# Patient Record
Sex: Female | Born: 1960 | Race: White | Hispanic: No | Marital: Married | Smoking: Former smoker
Health system: Southern US, Community
[De-identification: ages and names within clinical notes are randomized; demographics above are authoritative.]

## PROBLEM LIST (undated history)

## (undated) DIAGNOSIS — K449 Diaphragmatic hernia without obstruction or gangrene: Secondary | ICD-10-CM

## (undated) DIAGNOSIS — R55 Syncope and collapse: Secondary | ICD-10-CM

## (undated) DIAGNOSIS — I2510 Atherosclerotic heart disease of native coronary artery without angina pectoris: Secondary | ICD-10-CM

## (undated) DIAGNOSIS — F329 Major depressive disorder, single episode, unspecified: Secondary | ICD-10-CM

## (undated) DIAGNOSIS — J701 Chronic and other pulmonary manifestations due to radiation: Secondary | ICD-10-CM

## (undated) DIAGNOSIS — C50412 Malignant neoplasm of upper-outer quadrant of left female breast: Secondary | ICD-10-CM

## (undated) DIAGNOSIS — Z98811 Dental restoration status: Secondary | ICD-10-CM

## (undated) DIAGNOSIS — R112 Nausea with vomiting, unspecified: Secondary | ICD-10-CM

## (undated) DIAGNOSIS — E538 Deficiency of other specified B group vitamins: Secondary | ICD-10-CM

## (undated) DIAGNOSIS — Z9889 Other specified postprocedural states: Secondary | ICD-10-CM

## (undated) DIAGNOSIS — N6320 Unspecified lump in the left breast, unspecified quadrant: Secondary | ICD-10-CM

## (undated) DIAGNOSIS — R7303 Prediabetes: Secondary | ICD-10-CM

## (undated) DIAGNOSIS — Z8719 Personal history of other diseases of the digestive system: Secondary | ICD-10-CM

## (undated) DIAGNOSIS — E039 Hypothyroidism, unspecified: Secondary | ICD-10-CM

## (undated) DIAGNOSIS — F419 Anxiety disorder, unspecified: Secondary | ICD-10-CM

## (undated) DIAGNOSIS — I341 Nonrheumatic mitral (valve) prolapse: Secondary | ICD-10-CM

## (undated) DIAGNOSIS — Z170 Estrogen receptor positive status [ER+]: Secondary | ICD-10-CM

## (undated) DIAGNOSIS — F988 Other specified behavioral and emotional disorders with onset usually occurring in childhood and adolescence: Secondary | ICD-10-CM

## (undated) DIAGNOSIS — E785 Hyperlipidemia, unspecified: Secondary | ICD-10-CM

## (undated) DIAGNOSIS — Z923 Personal history of irradiation: Secondary | ICD-10-CM

## (undated) DIAGNOSIS — I10 Essential (primary) hypertension: Secondary | ICD-10-CM

## (undated) DIAGNOSIS — J449 Chronic obstructive pulmonary disease, unspecified: Secondary | ICD-10-CM

## (undated) DIAGNOSIS — C50919 Malignant neoplasm of unspecified site of unspecified female breast: Secondary | ICD-10-CM

## (undated) DIAGNOSIS — C801 Malignant (primary) neoplasm, unspecified: Secondary | ICD-10-CM

## (undated) DIAGNOSIS — F32A Depression, unspecified: Secondary | ICD-10-CM

## (undated) HISTORY — PX: CARDIAC CATHETERIZATION: SHX172

## (undated) HISTORY — DX: Prediabetes: R73.03

## (undated) HISTORY — DX: Chronic obstructive pulmonary disease, unspecified: J44.9

## (undated) HISTORY — DX: Other specified behavioral and emotional disorders with onset usually occurring in childhood and adolescence: F98.8

## (undated) HISTORY — DX: Deficiency of other specified B group vitamins: E53.8

## (undated) HISTORY — PX: APPENDECTOMY: SHX54

## (undated) HISTORY — DX: Nonrheumatic mitral (valve) prolapse: I34.1

## (undated) HISTORY — DX: Hyperlipidemia, unspecified: E78.5

## (undated) HISTORY — DX: Diaphragmatic hernia without obstruction or gangrene: K44.9

## (undated) HISTORY — DX: Malignant (primary) neoplasm, unspecified: C80.1

## (undated) HISTORY — DX: Estrogen receptor positive status (ER+): Z17.0

## (undated) HISTORY — DX: Essential (primary) hypertension: I10

## (undated) HISTORY — DX: Estrogen receptor positive status (ER+): C50.412

## (undated) HISTORY — DX: Syncope and collapse: R55

## (undated) HISTORY — PX: ABDOMINAL HYSTERECTOMY: SHX81

---
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
## 2003-01-10 ENCOUNTER — Encounter: Admission: RE | Admit: 2003-01-10 | Discharge: 2003-01-10 | Payer: Self-pay | Admitting: Family Medicine

## 2003-01-10 IMAGING — US US SOFT TISSUE HEAD/NECK
1 series · 14 of 25 positions shown · non-contrast
Comparison: none

CLINICAL DATA: Enlarged thyroid on physical examination.
THYROID ULTRASOUND
The thyroid gland is mildly enlarged.  The right lobe measures 5.8 x 2.2 x 1.3 cm in maximum dimensions.  The left lobe measures 5.4 x 2.6 x 1.3 cm in maximum dimensions.  A mildly inhomogeneous solid mass with small cystic components is demonstrated in the posterior aspect of the inferior portion of the right lobe.  This mass measures 2.0 x 1.2 x 0.9 cm in maximum dimensions.  Also demonstrated is a 0.4 x 0.4 x 0.2 cm solid mass in the anterior aspect of the inferior portion of the right lobe.  A 1.2 x 1.2 x 0.7 cm solid mass is demonstrated in the inferior aspect of the left lobe.  The isthmus has a normal appearance, measuring 3.2 mm in thickness in the midline.
IMPRESSION
Mild multinodular goiter, as described above.

[Series 1: unknown · 0.07mm/px · 14 of 32 slices shown]
[im 1/32]
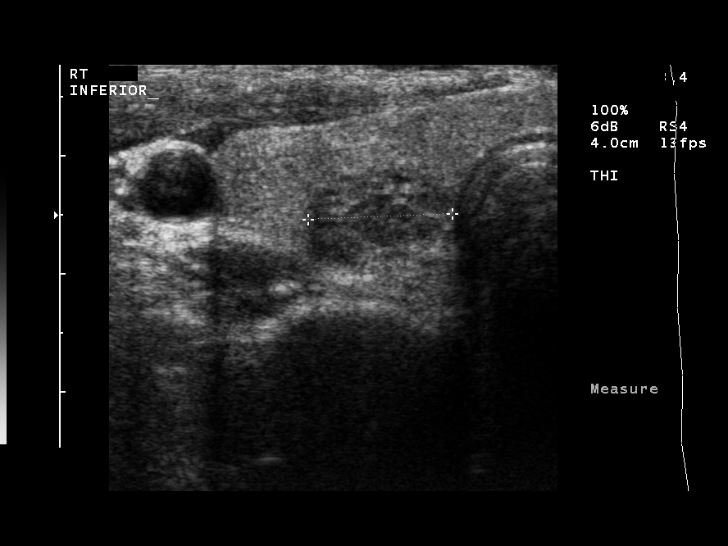
[im 3/32]
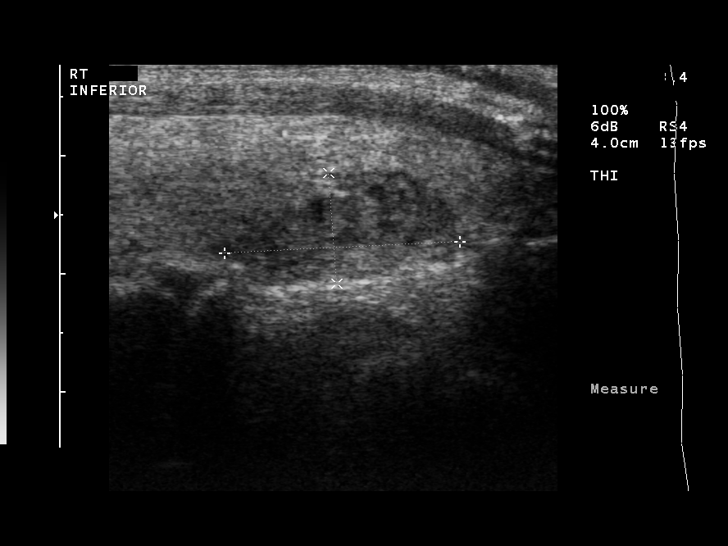
[im 6/32]
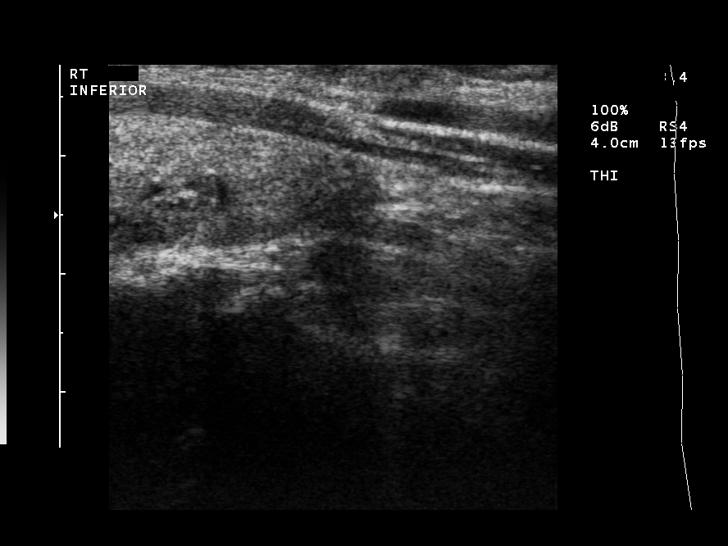
[im 8/32]
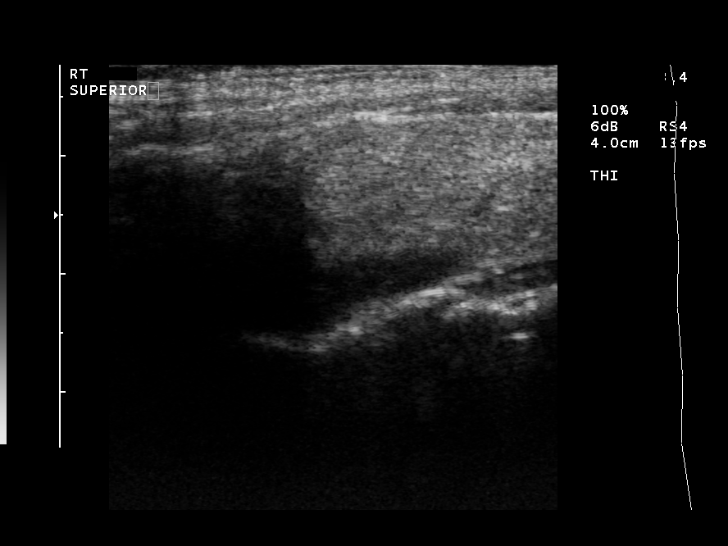
[im 11/32]
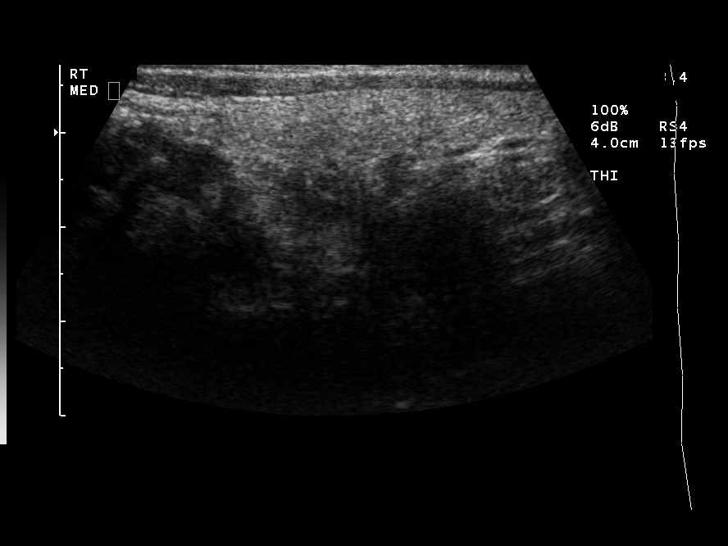
[im 12/32]
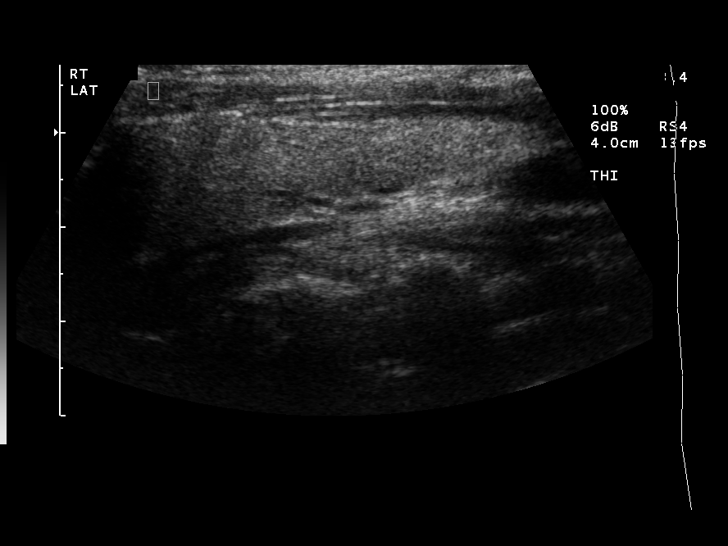
[im 15/32]
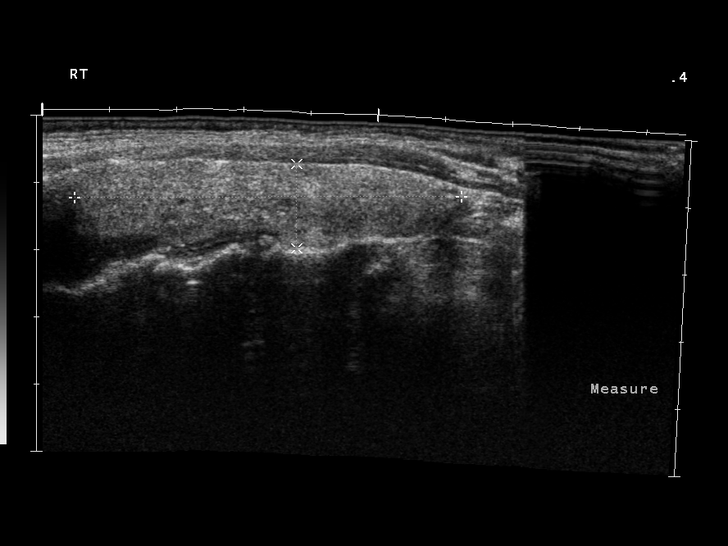
[im 17/32]
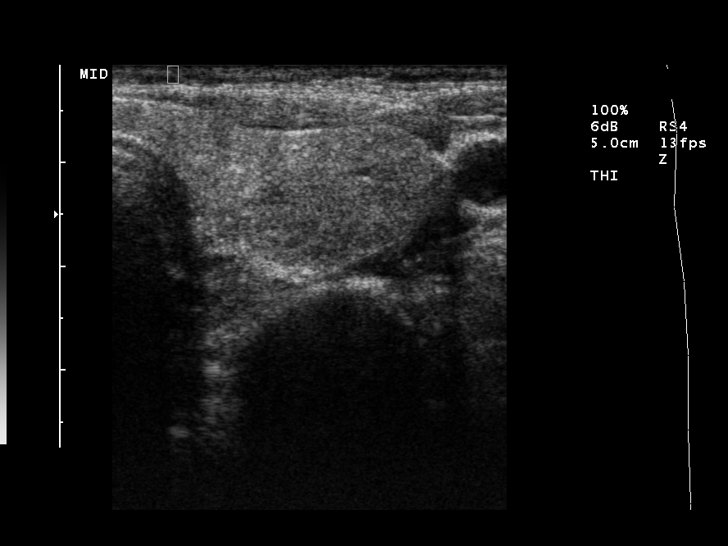
[im 20/32]
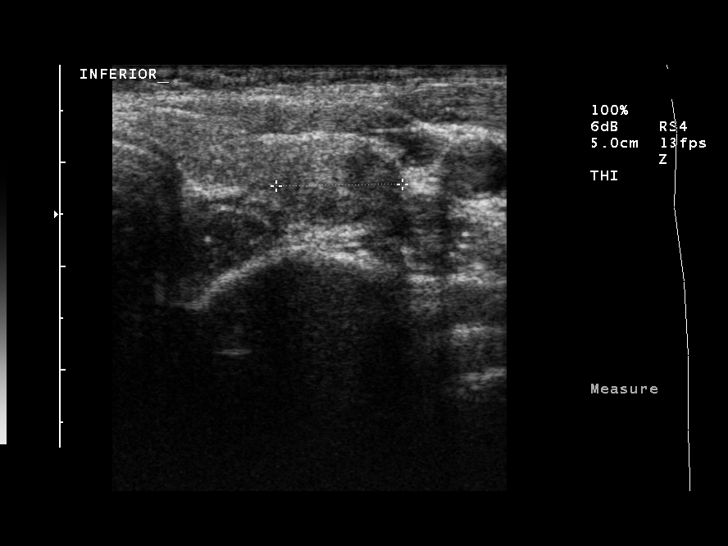
[im 21/32]
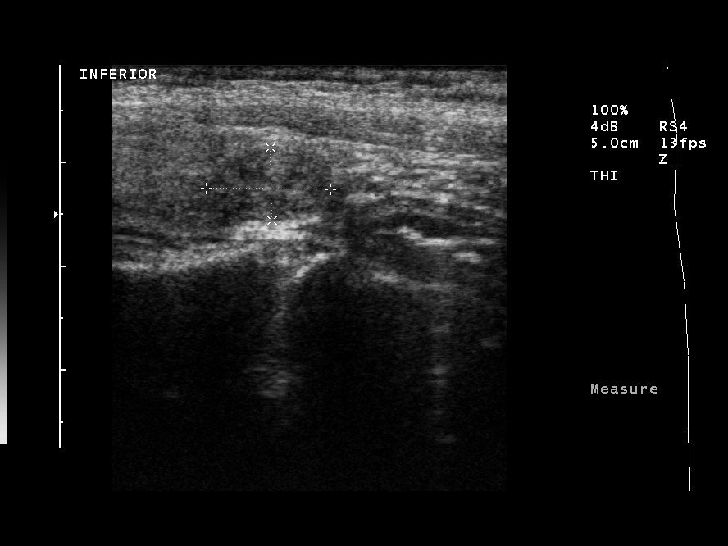
[im 24/32]
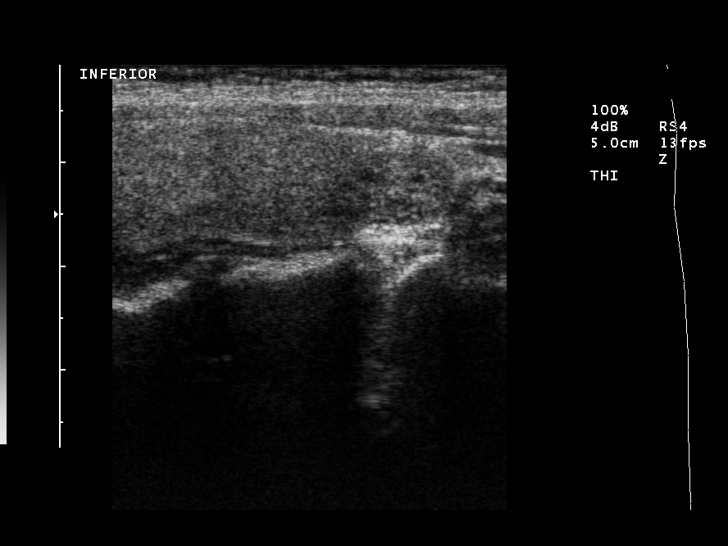
[im 26/32]
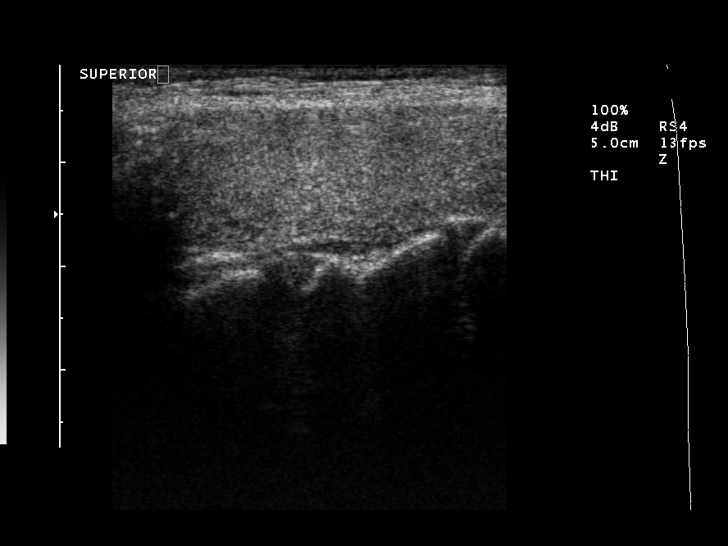
[im 29/32]
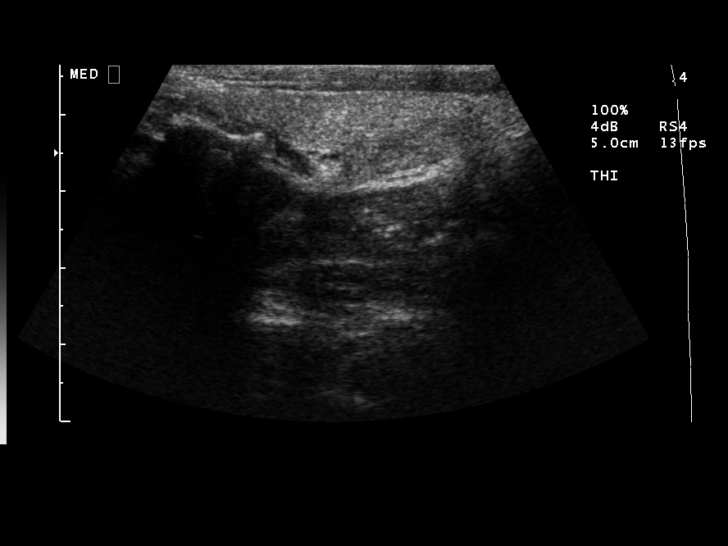
[im 32/32]
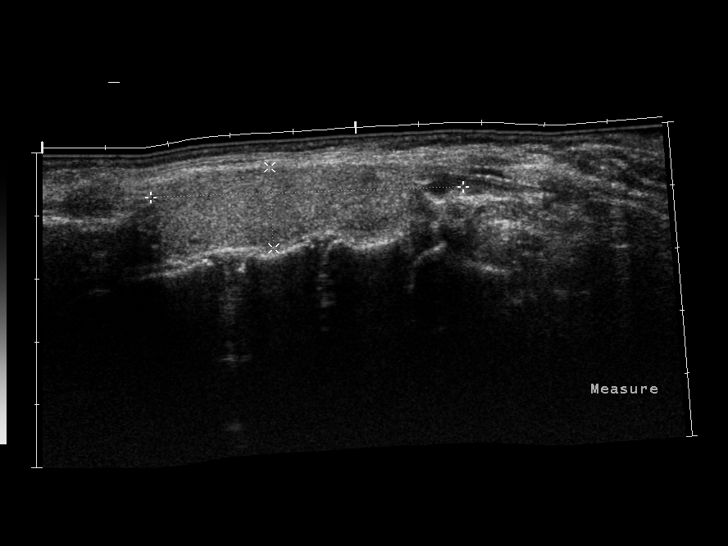

[14 of 25 positions shown; findings below may reference images not displayed]

## 2003-01-18 ENCOUNTER — Encounter: Admission: RE | Admit: 2003-01-18 | Discharge: 2003-01-18 | Payer: Self-pay | Admitting: Internal Medicine

## 2003-01-31 ENCOUNTER — Ambulatory Visit (HOSPITAL_COMMUNITY): Admission: RE | Admit: 2003-01-31 | Discharge: 2003-01-31 | Payer: Self-pay | Admitting: Endocrinology

## 2003-02-01 ENCOUNTER — Encounter (INDEPENDENT_AMBULATORY_CARE_PROVIDER_SITE_OTHER): Payer: Self-pay | Admitting: Specialist

## 2003-02-01 ENCOUNTER — Ambulatory Visit (HOSPITAL_COMMUNITY): Admission: RE | Admit: 2003-02-01 | Discharge: 2003-02-01 | Payer: Self-pay | Admitting: Endocrinology

## 2003-02-01 IMAGING — US US BIOPSY
1 series · 13 of 14 positions shown · non-contrast
Comparison: none

CLINICAL DATA: Patient with history of mild multinodular goiter.  Thyroid ultrasound performed at [REDACTED] on [DATE] revealed a dominant mildly inhomogeneous solid nodule with small cystic components in the posterior aspect of the inferior portion of the right thyroid lobe measuring 2 x 1.2 x 0.9 cm.  Request is made for fine needle aspiration of this dominant right thyroid lobe nodule.
ULTRASOUND-GUIDED FINE NEEDLE ASPIRATION OF DOMINANT RIGHT THYROID LOBE NODULE 
An ultrasound-guided thyroid biopsy was thoroughly discussed with the patient, and questions were answered.  Risks and benefits of the procedure were also delineated.  Risks specifically discussed included bleeding, bruising, infection, and risk of injury to adjacent blood vessels and nerves.  The patient understands and wishes to proceed.  Verbal and written consent was obtained.
After the patient was prepped and draped in the normal sterile fashion, 1% lidocaine was used for local anesthesia.  Under direct ultrasound guidance, three passes were then made using 25 gauge hypodermic needles into the dominant right thyroid lobe nodule.  Ultrasound imaging confirmed appropriate needle placement in the nodule.  Specimens were given to Cytology for further analysis.  The patient tolerated the procedure well, and there were no immediate complications.  No hematoma was identified post procedure.  The procedure was performed under the direct supervision of Dr. GAGLIARDI. 
IMPRESSION
Successful ultrasound-guided fine needle aspiration of dominant right thyroid lobe nodule as discussed above.

[Series 1: unknown · 0.07mm/px · 13 of 14 slices shown]
[im 1/14]
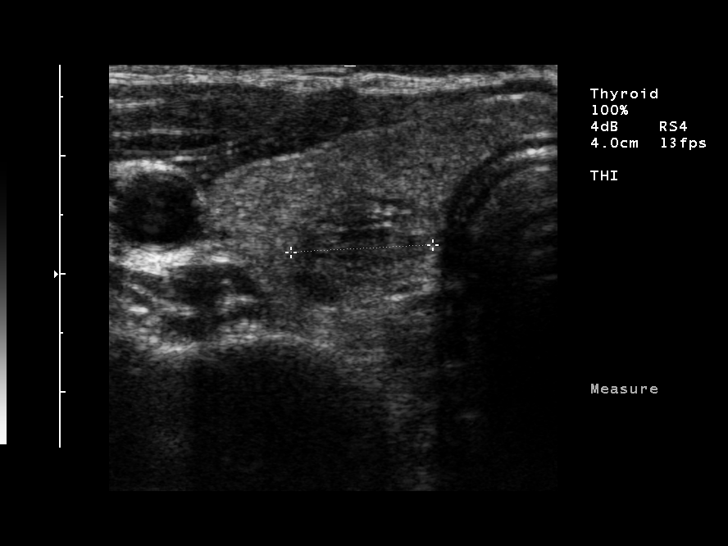
[im 2/14]
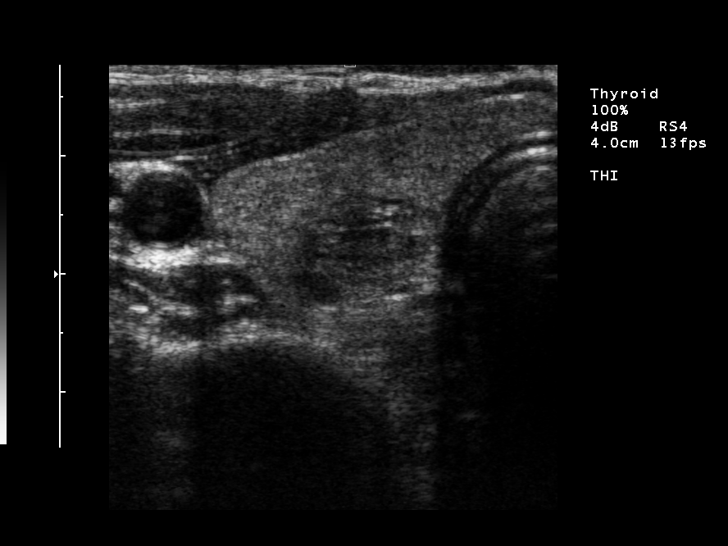
[im 3/14]
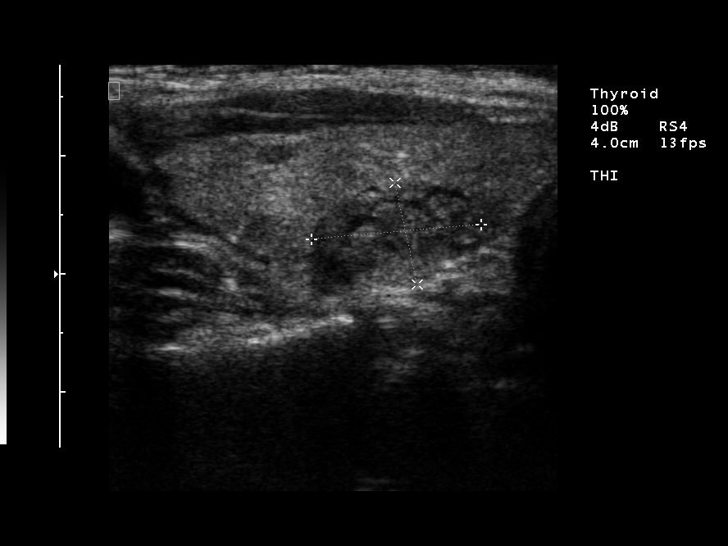
[im 4/14]
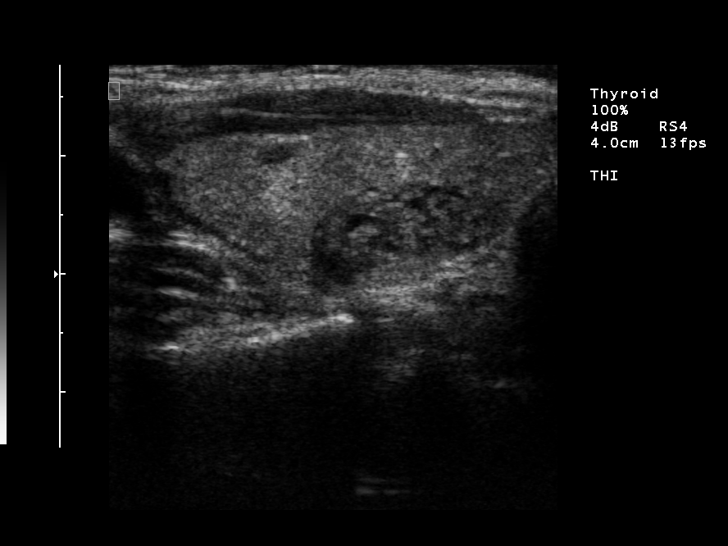
[im 5/14]
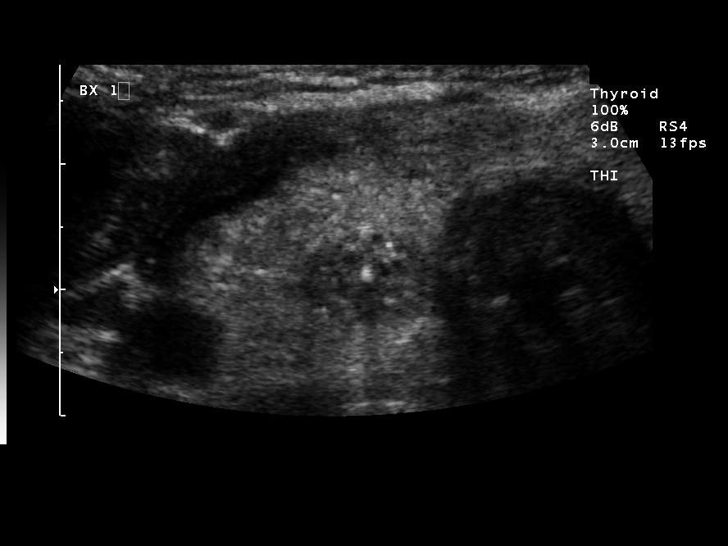
[im 6/14]
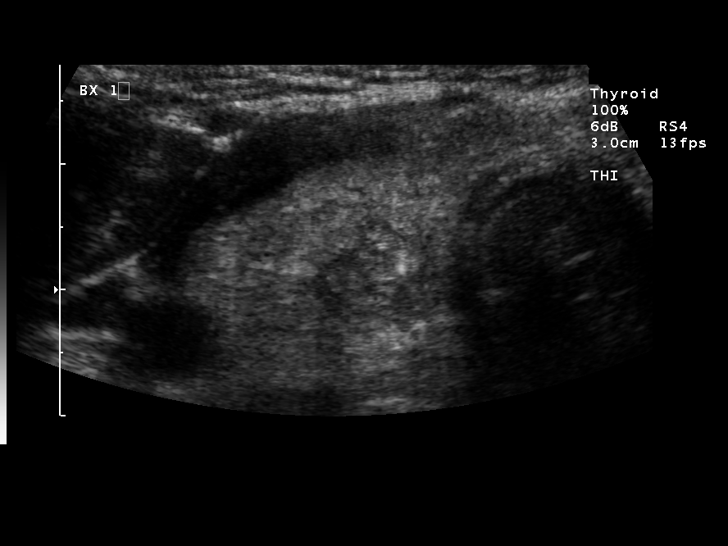
[im 8/14]
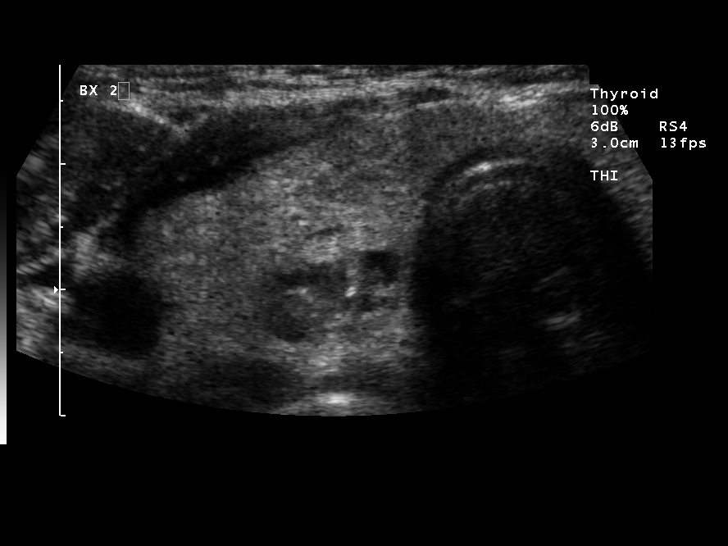
[im 9/14]
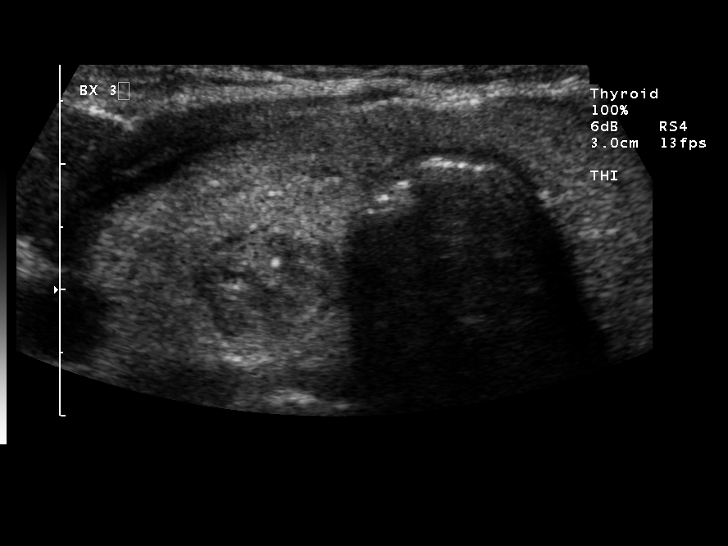
[im 10/14]
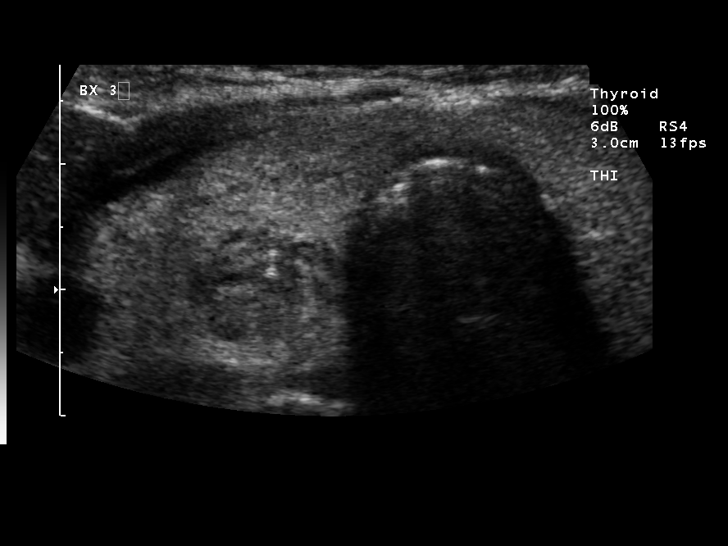
[im 11/14]
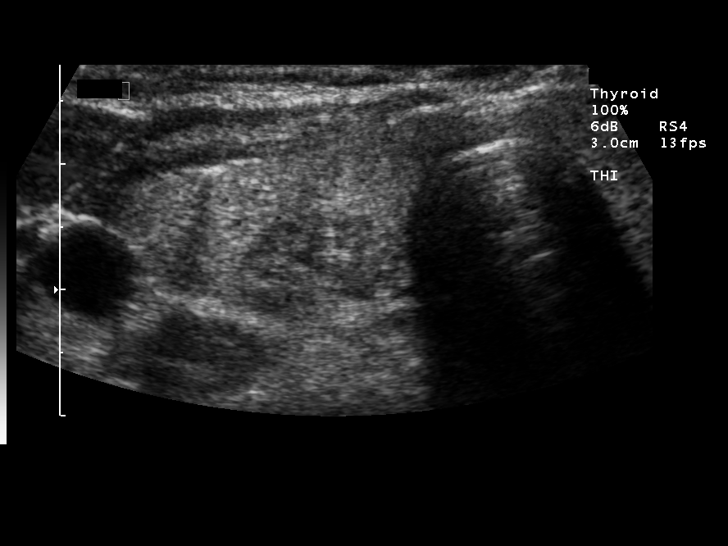
[im 12/14]
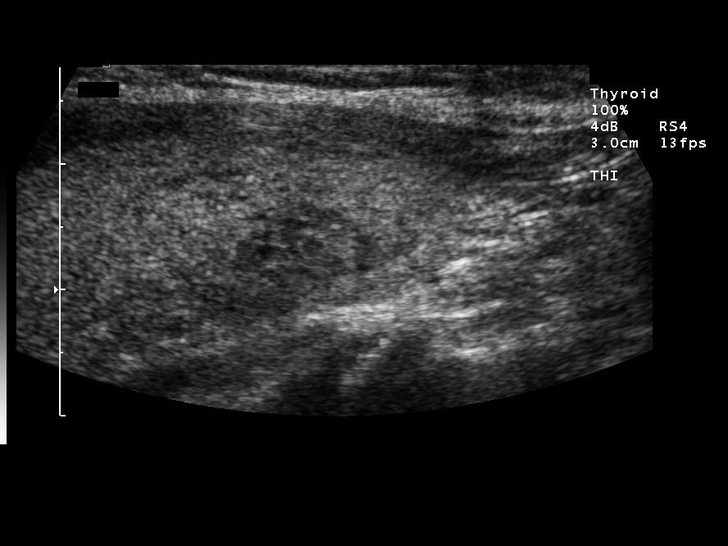
[im 13/14]
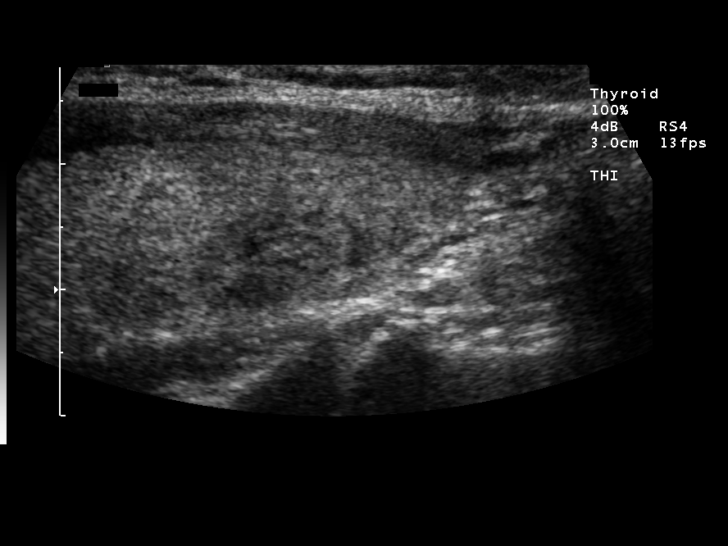
[im 14/14]
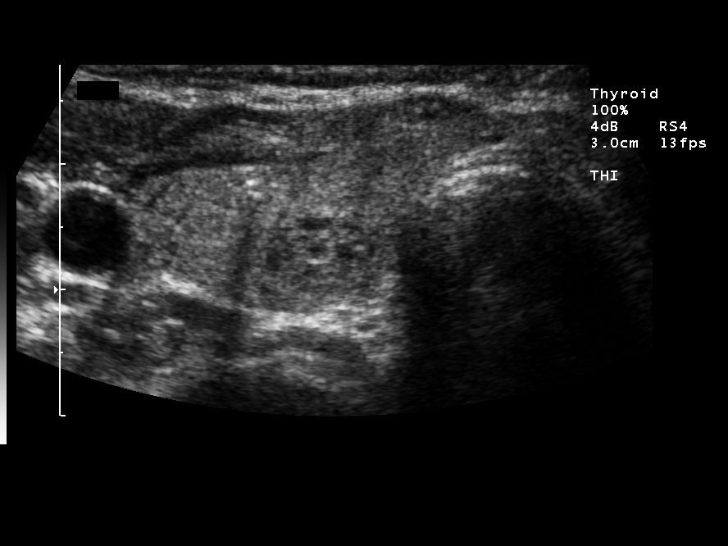

[13 of 14 positions shown; findings below may reference images not displayed]

## 2003-04-02 ENCOUNTER — Encounter (INDEPENDENT_AMBULATORY_CARE_PROVIDER_SITE_OTHER): Payer: Self-pay | Admitting: Specialist

## 2003-04-02 HISTORY — PX: TOTAL THYROIDECTOMY: SHX2547

## 2003-04-03 ENCOUNTER — Inpatient Hospital Stay (HOSPITAL_COMMUNITY): Admission: RE | Admit: 2003-04-03 | Discharge: 2003-04-08 | Payer: Self-pay | Admitting: Surgery

## 2003-10-05 ENCOUNTER — Emergency Department (HOSPITAL_COMMUNITY): Admission: AC | Admit: 2003-10-05 | Discharge: 2003-10-05 | Payer: Self-pay | Admitting: *Deleted

## 2003-10-05 IMAGING — CT CT RECONSTRUCTION
2 of 6 series · 9 of 20 positions shown, 11 images · non-contrast
Comparison: none

CLINICAL DATA: 42-year-old female, fall, silver trauma.
No comparisons.
CT HEAD WITHOUT CONTRAST
There is no acute mass effect, edema, hemorrhage, herniation, hydrocephalus, midline shift, or extraaxial fluid collection.  Gray and white matter differentiation is well maintained.  Ventricles are symmetric.  Cisterns are patent.  Visualized sinuses and mastoids are clear.  No skull fracture.
IMPRESSION
No acute intracranial abnormality.  
CT CERVICAL SPINE MULTIPLANAR RECONSTRUCTION
Multiplanar reformatted CT images were reconstructed from the axial CT data set. These images were reviewed and pertinent findings are included in the accompanying complete CT report.

[Series 105: helical c-spine · axial · 0.47mm/px · z∈[-322,-163]mm · 8 of 432 slices shown, 10 images]
[im 48/432  soft-tissue]
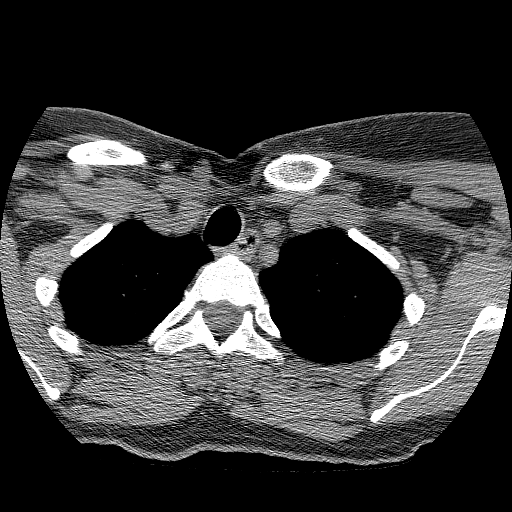
[im 48/432  bone]
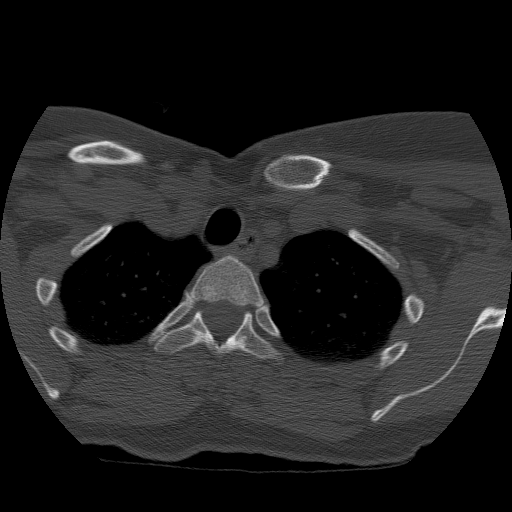
[im 96/432  bone]
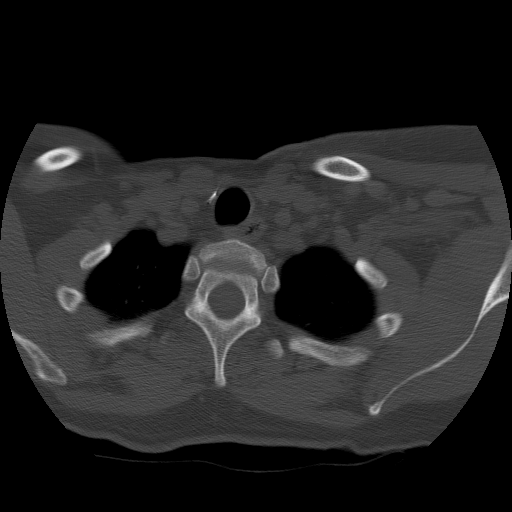
[im 144/432  bone]
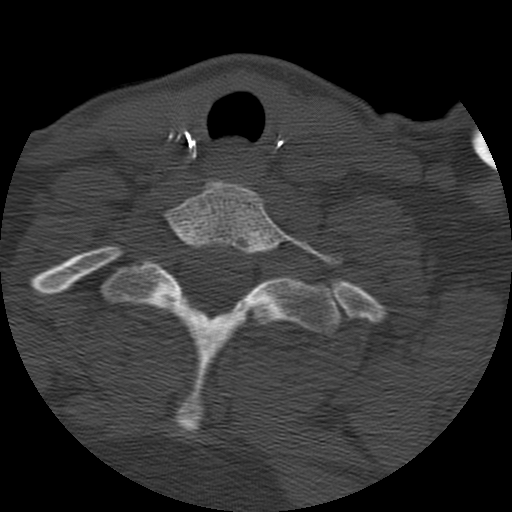
[im 192/432  bone]
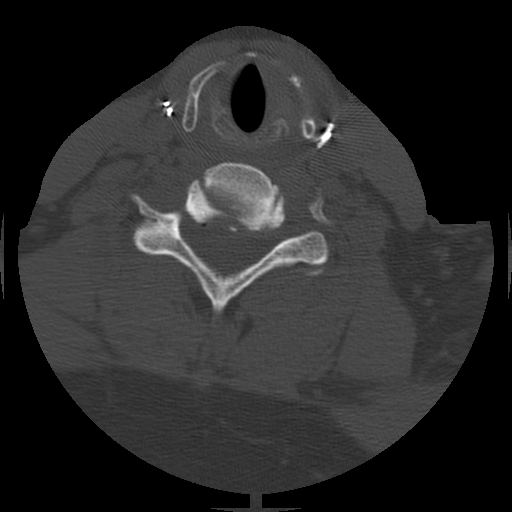
[im 240/432  soft-tissue]
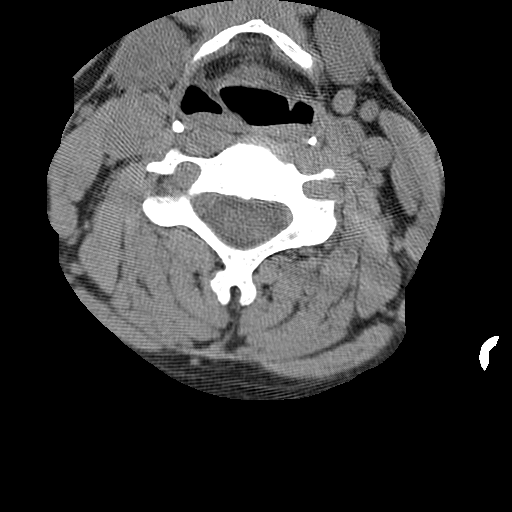
[im 240/432  bone]
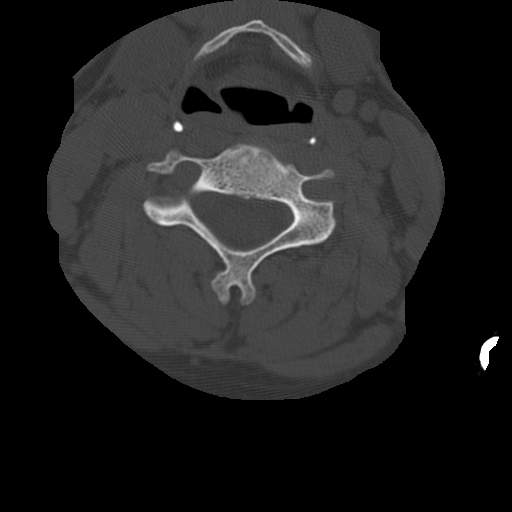
[im 288/432  bone]
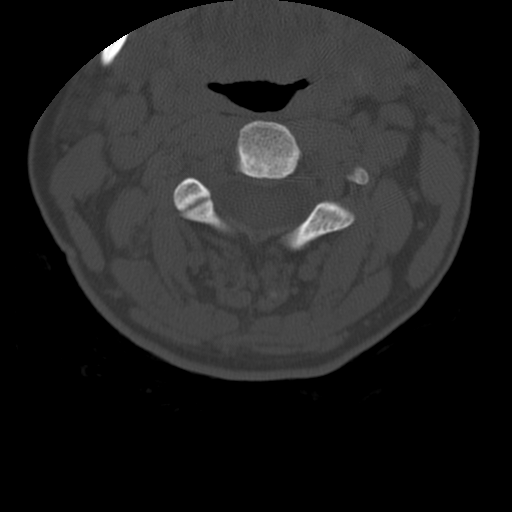
[im 336/432  bone]
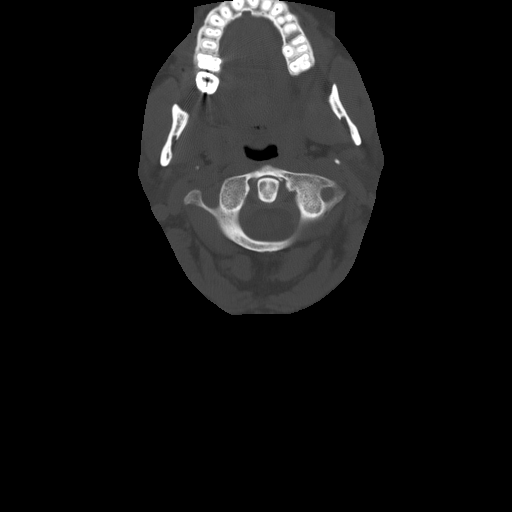
[im 384/432  bone]
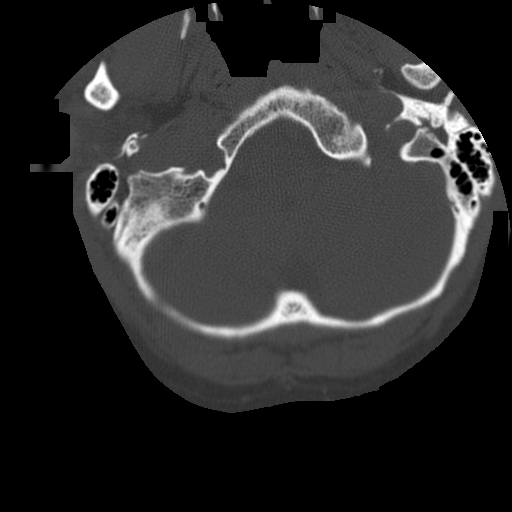

[Series 106: reformatted · sagittal · 0.39mm/px · 1 of 33 slices shown]
[im 17/33  bone]
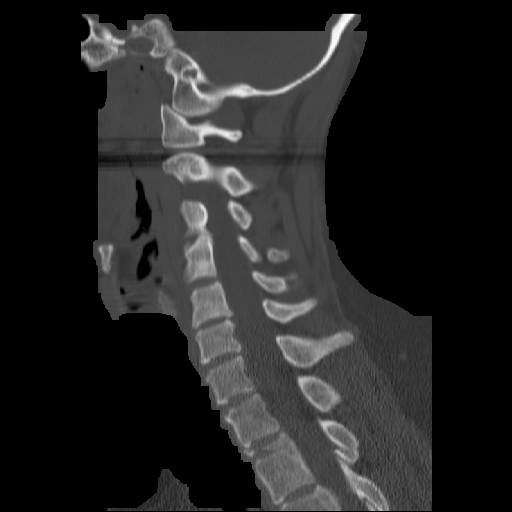

[9 of 20 positions shown; findings below may reference images not displayed]

IMPRESSION
See complete CT report. 

CT CERVICAL SPINE WITHOUT CONTRAST

C1 demonstrates incomplete closure anteriorly overlying the right transverse foramen.  The cortical margins are distinct and well sclerotic.  This does not have the appearance of an acute fracture and probably represents a normal variant.  Odontoid appears intact.  No acute radiolucent fracture line or displacement.  Facets are aligned.  C5-6 and C6-7 cervical spondylosis is noted.  Patient is status post thyroidectomy.  

IMPRESSION
1.  No acute cervical spine fracture by CT.  
2.  C5-6 and C6-7 cervical spondylosis.

## 2003-10-05 IMAGING — CR DG HAND COMPLETE 3+V*R*
3 series · 3 of 3 positions shown · non-contrast
Comparison: none

CLINICAL DATA: Laceration over the metacarpal area.
 RIGHT HAND THREE VIEWS
 Bone density is normal.  Alignment is anatomic.  No acute fracture.  Soft tissue swelling is evident over the second metacarpal.  
 IMPRESSION
 Soft tissue swelling without acute bony abnormality.

[view not recorded (1 of 3)]
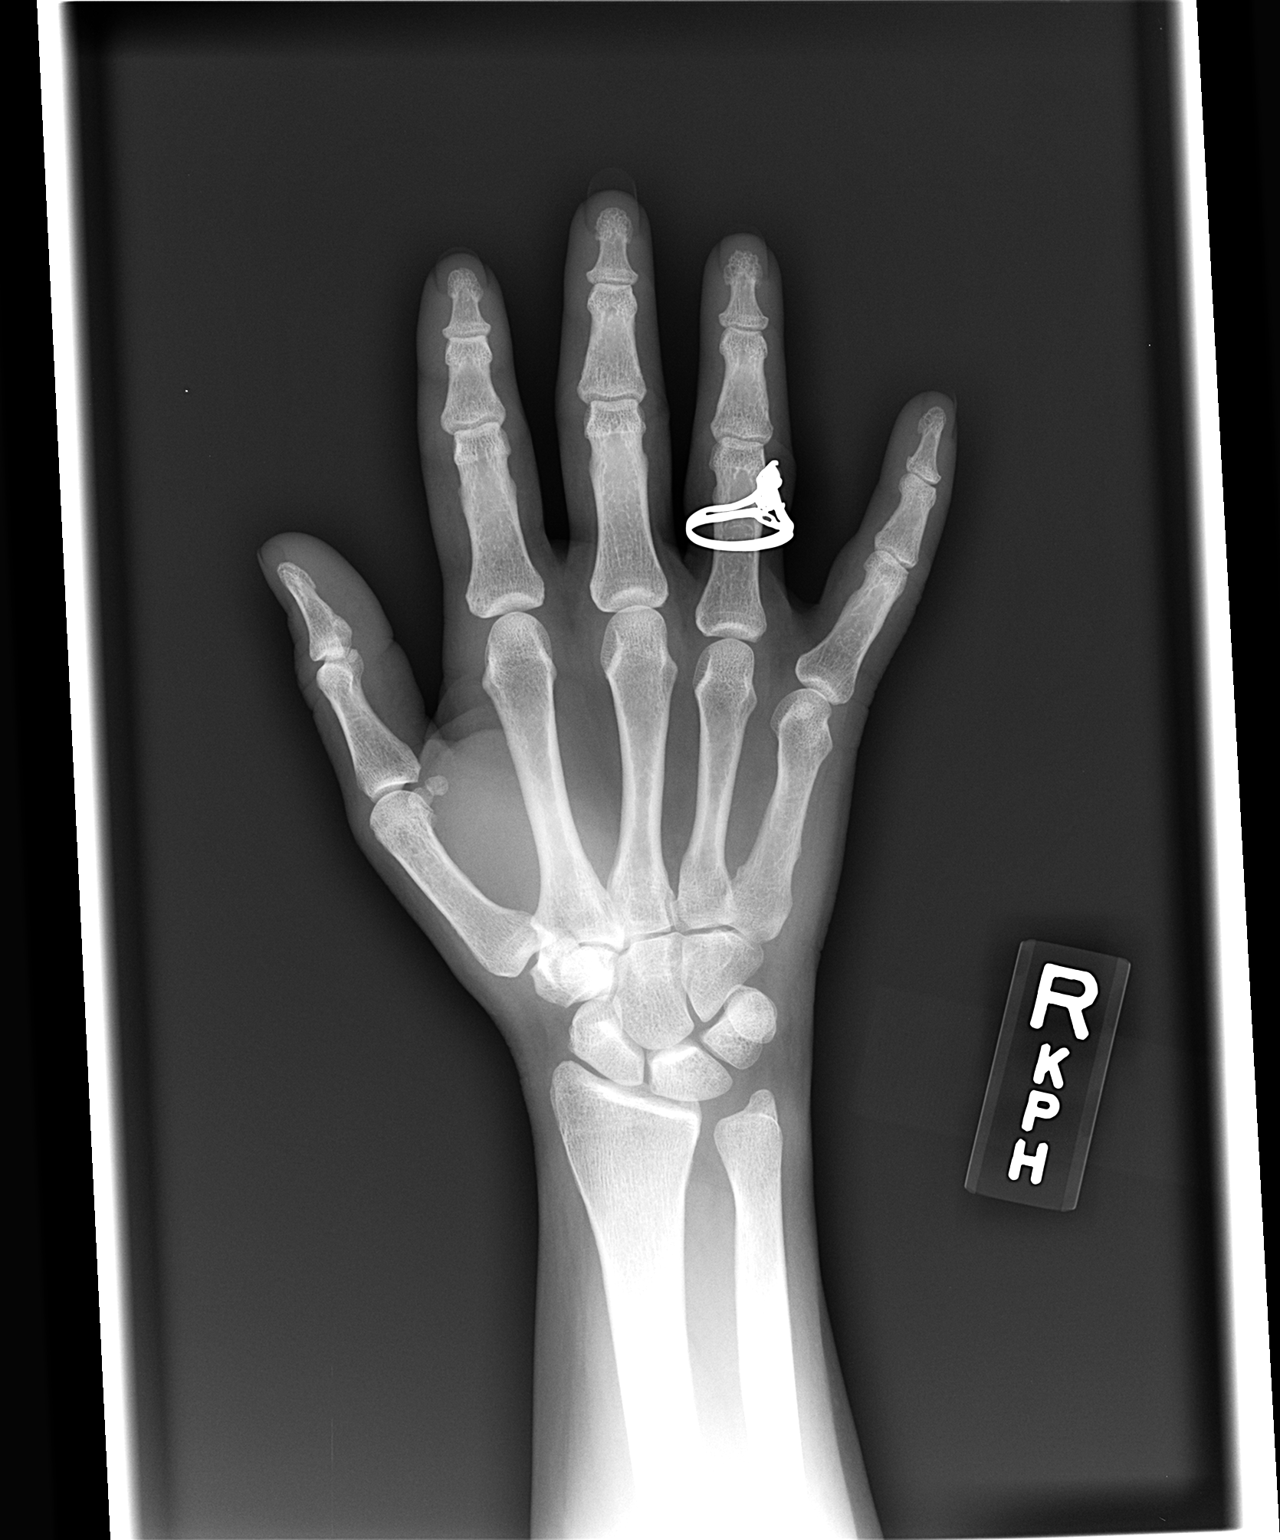

[view not recorded (2 of 3)]
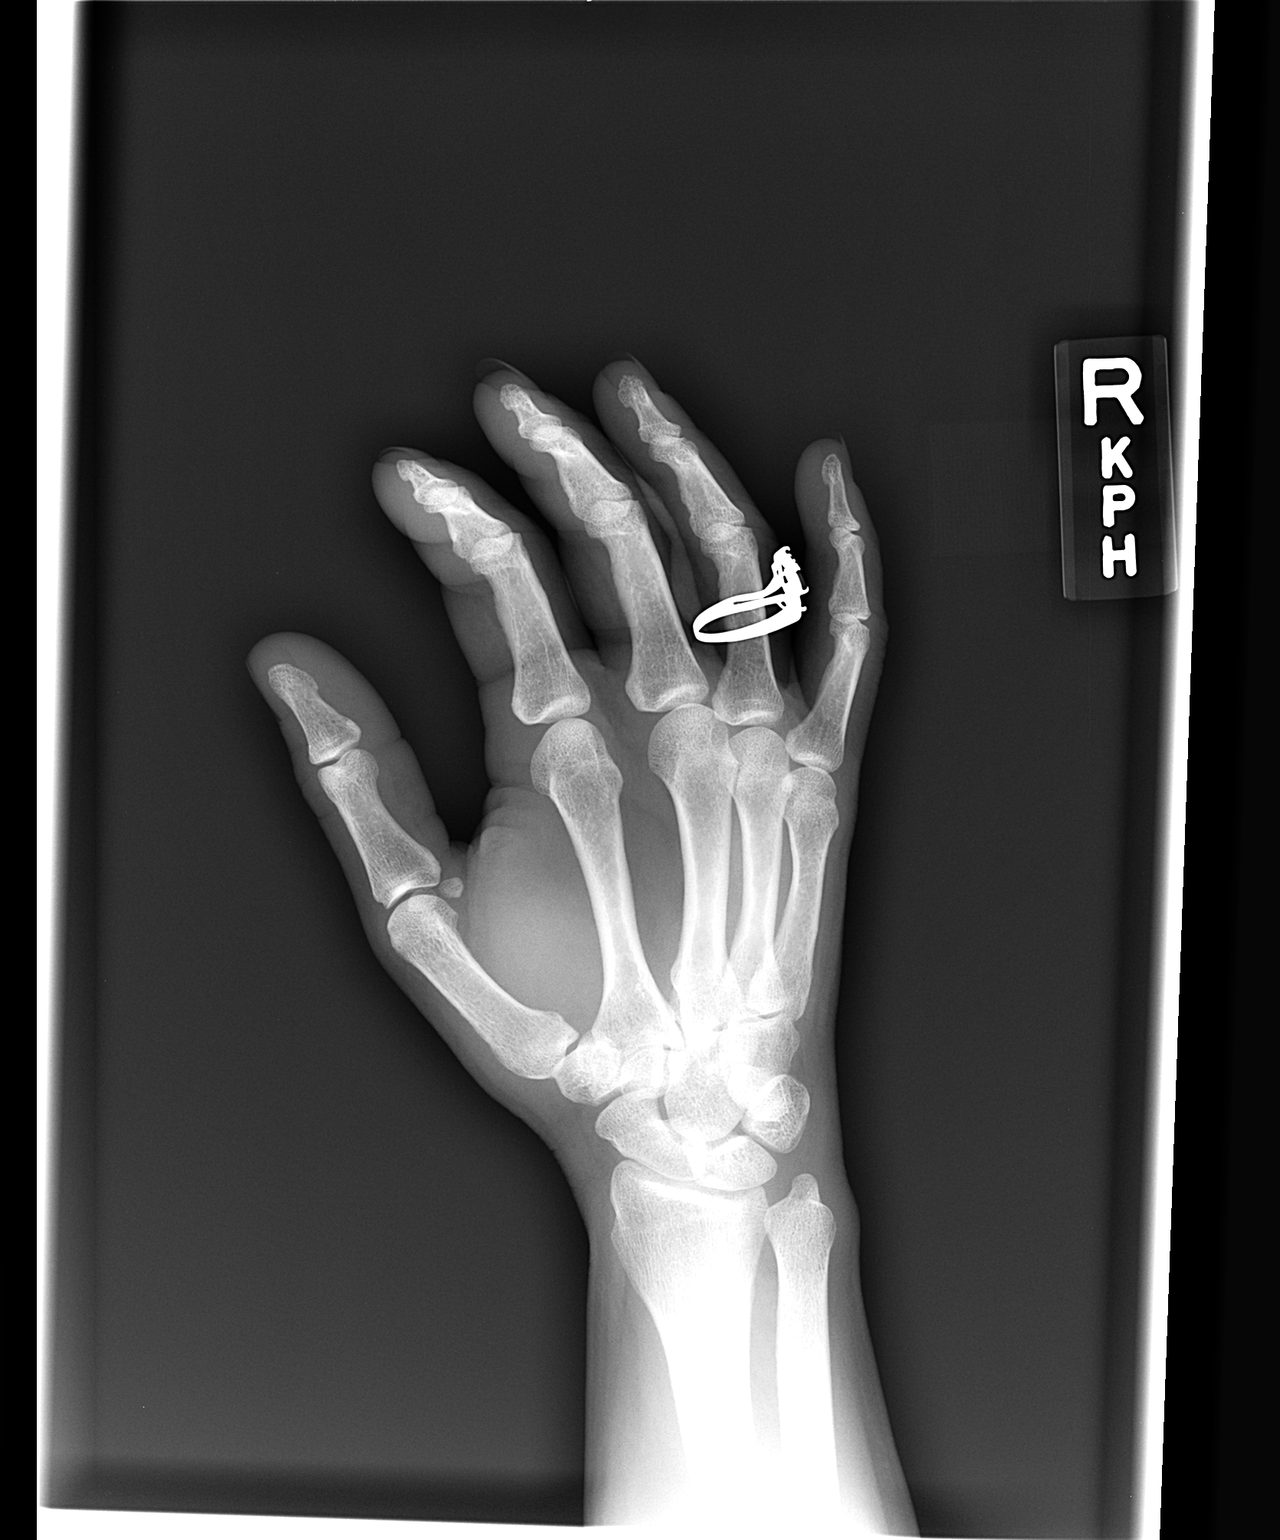

[view not recorded (3 of 3)]
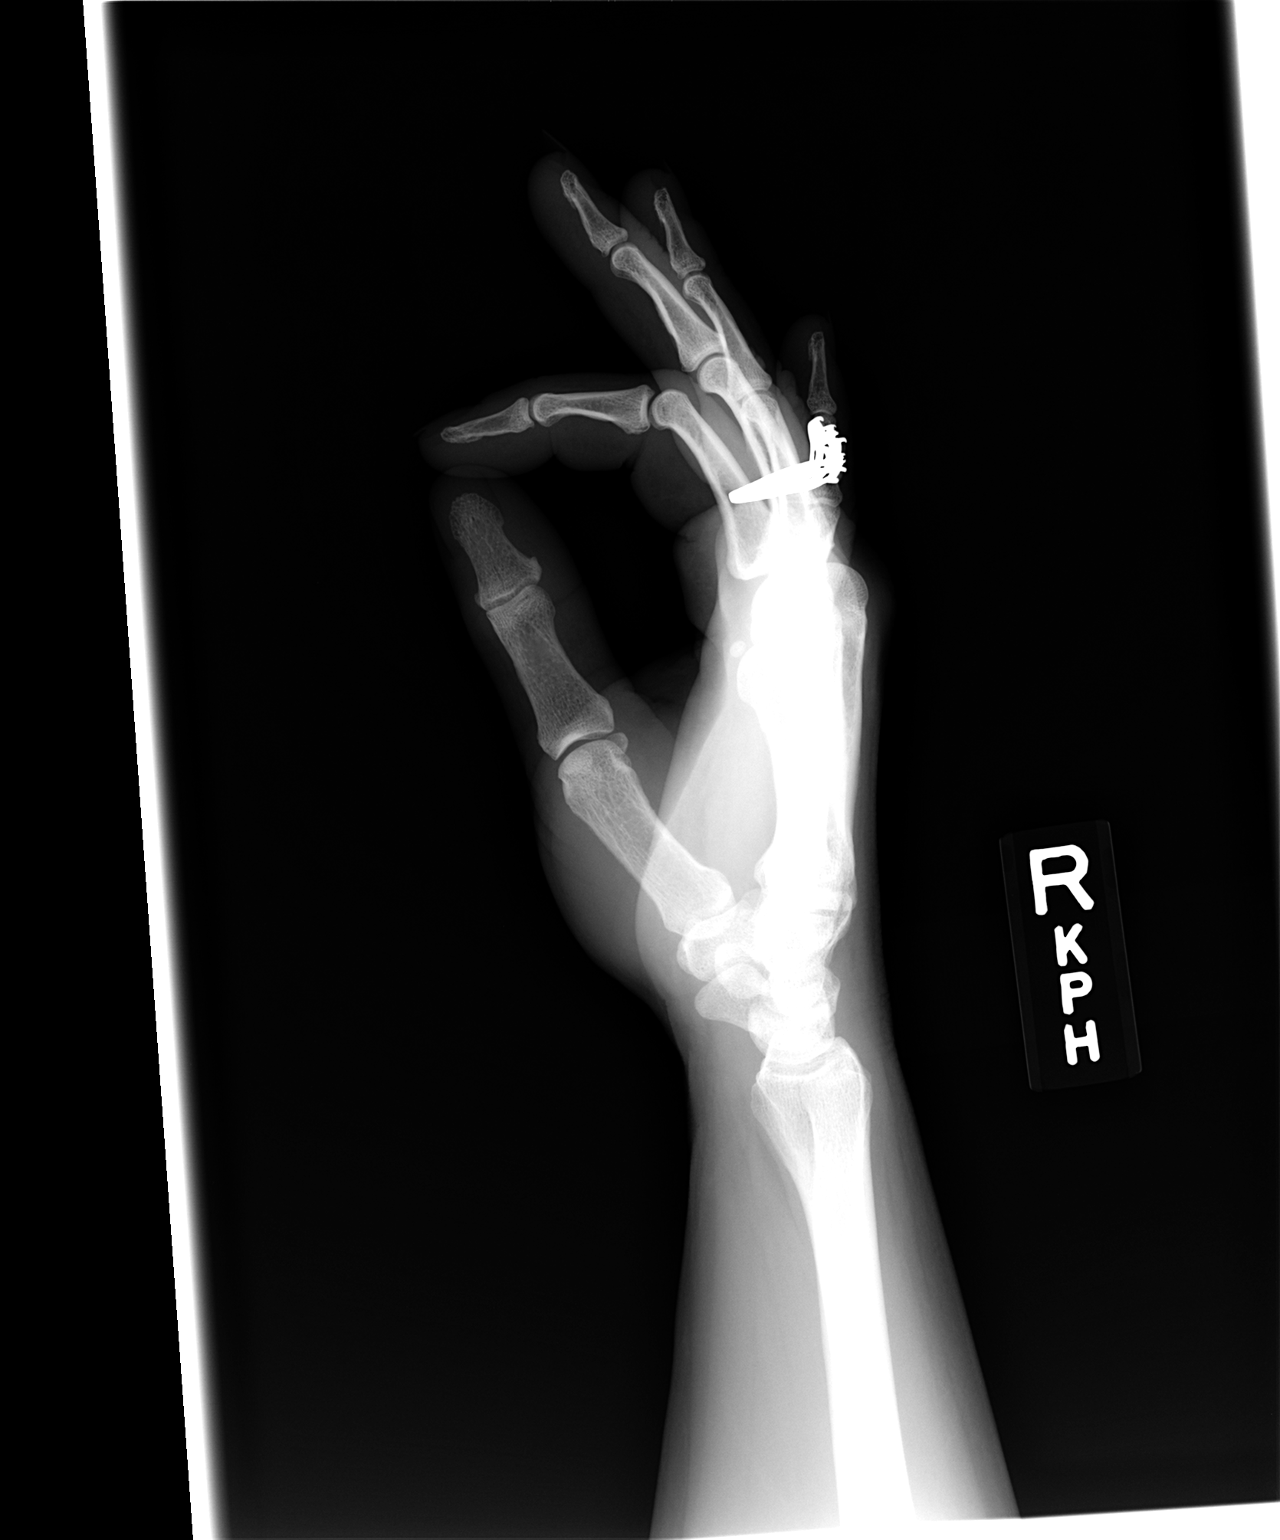

[3 of 3 positions shown; findings below may reference images not displayed]

## 2003-10-05 IMAGING — CR DG PELVIS 1-2V
1 series · 1 of 1 positions shown · non-contrast
Comparison: none

CLINICAL DATA: 42-year-old female, fall, confusion.  Silver trauma.
 PELVIS ONE VIEW
 Hips are located and intact.  No acute fracture.  Bony pelvis is intact.  Symphysis is aligned.  SI joints are symmetric.
 IMPRESSION
 No acute finding by plain radiography.

[view not recorded]
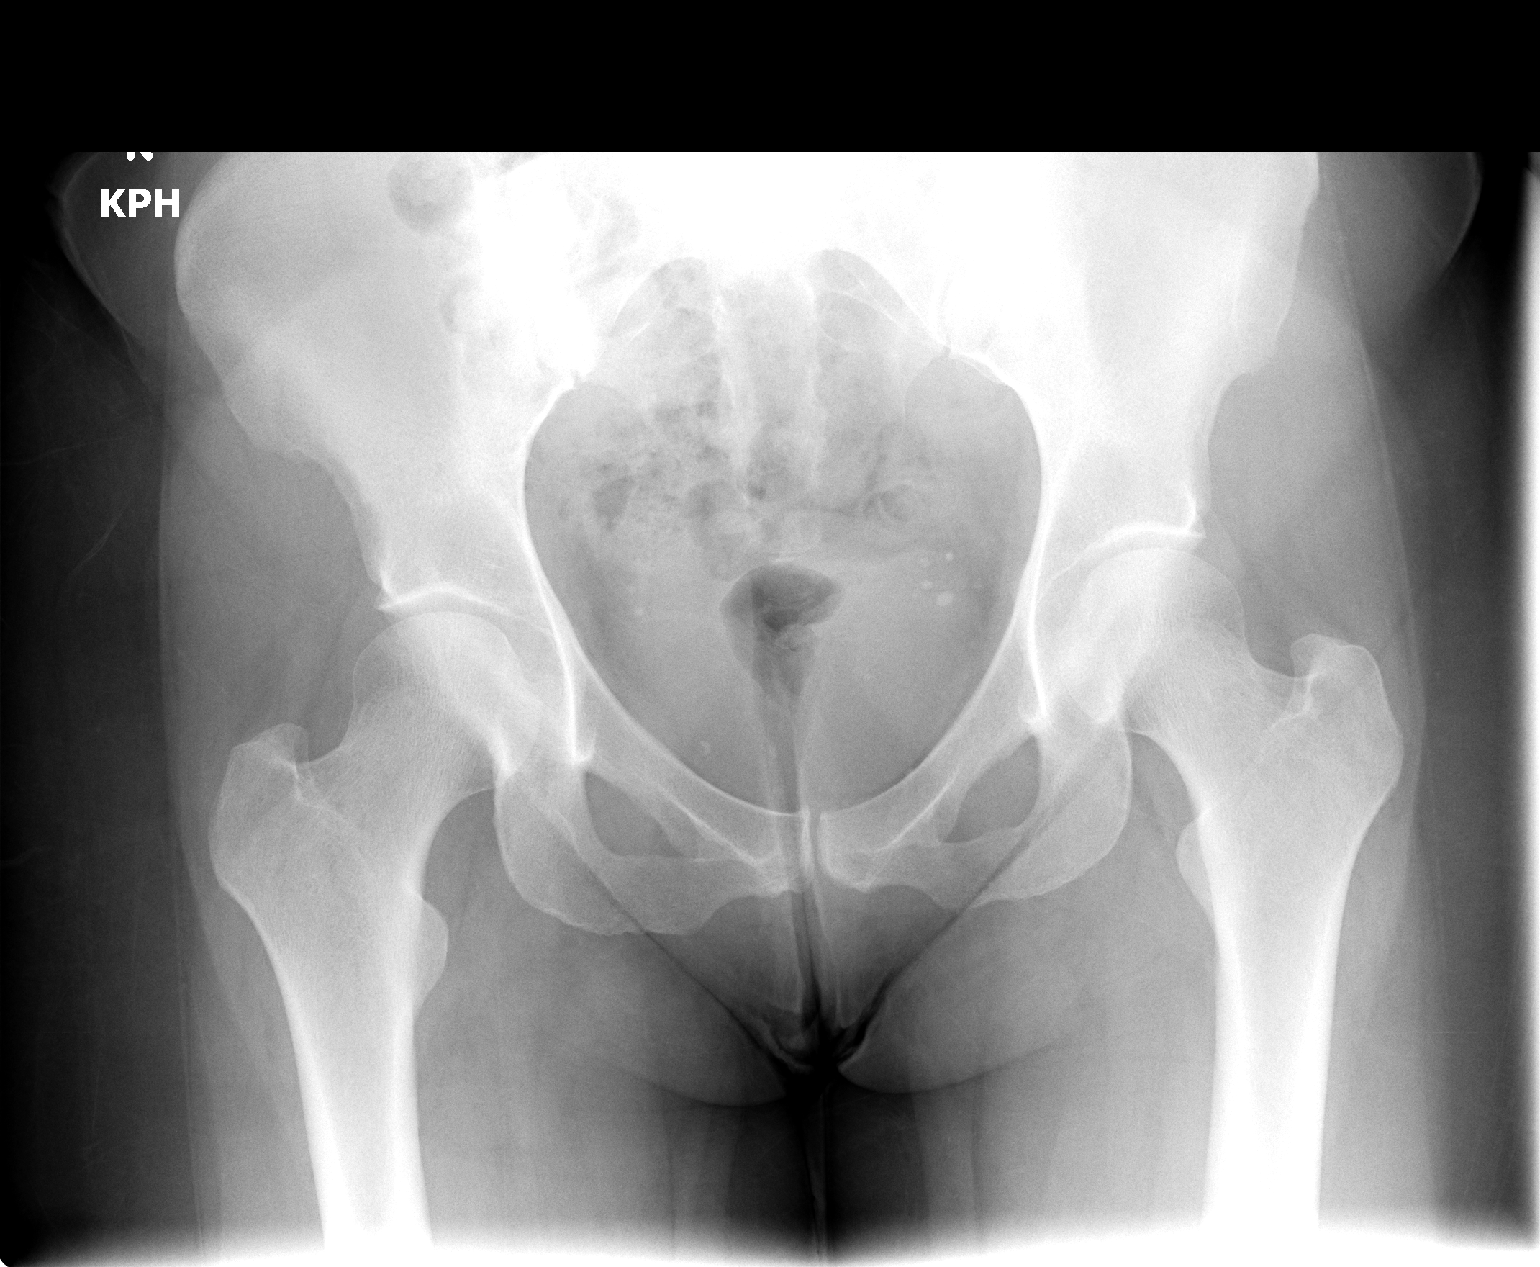

[1 of 1 positions shown; findings below may reference images not displayed]

## 2003-10-05 IMAGING — CR DG CHEST 2V
2 series · 2 of 2 positions shown · non-contrast
Comparison: none

CLINICAL DATA: 42-year-old female, fall, loss of consciousness, silver trauma.  
 CHEST TWO VIEWS
 No comparisons.
 Lungs are clear.  No active air space disease.  Heart size is normal.  No pneumothorax.  No effusion.
 IMPRESSION
 No active chest disease.

[view not recorded (1 of 2)]
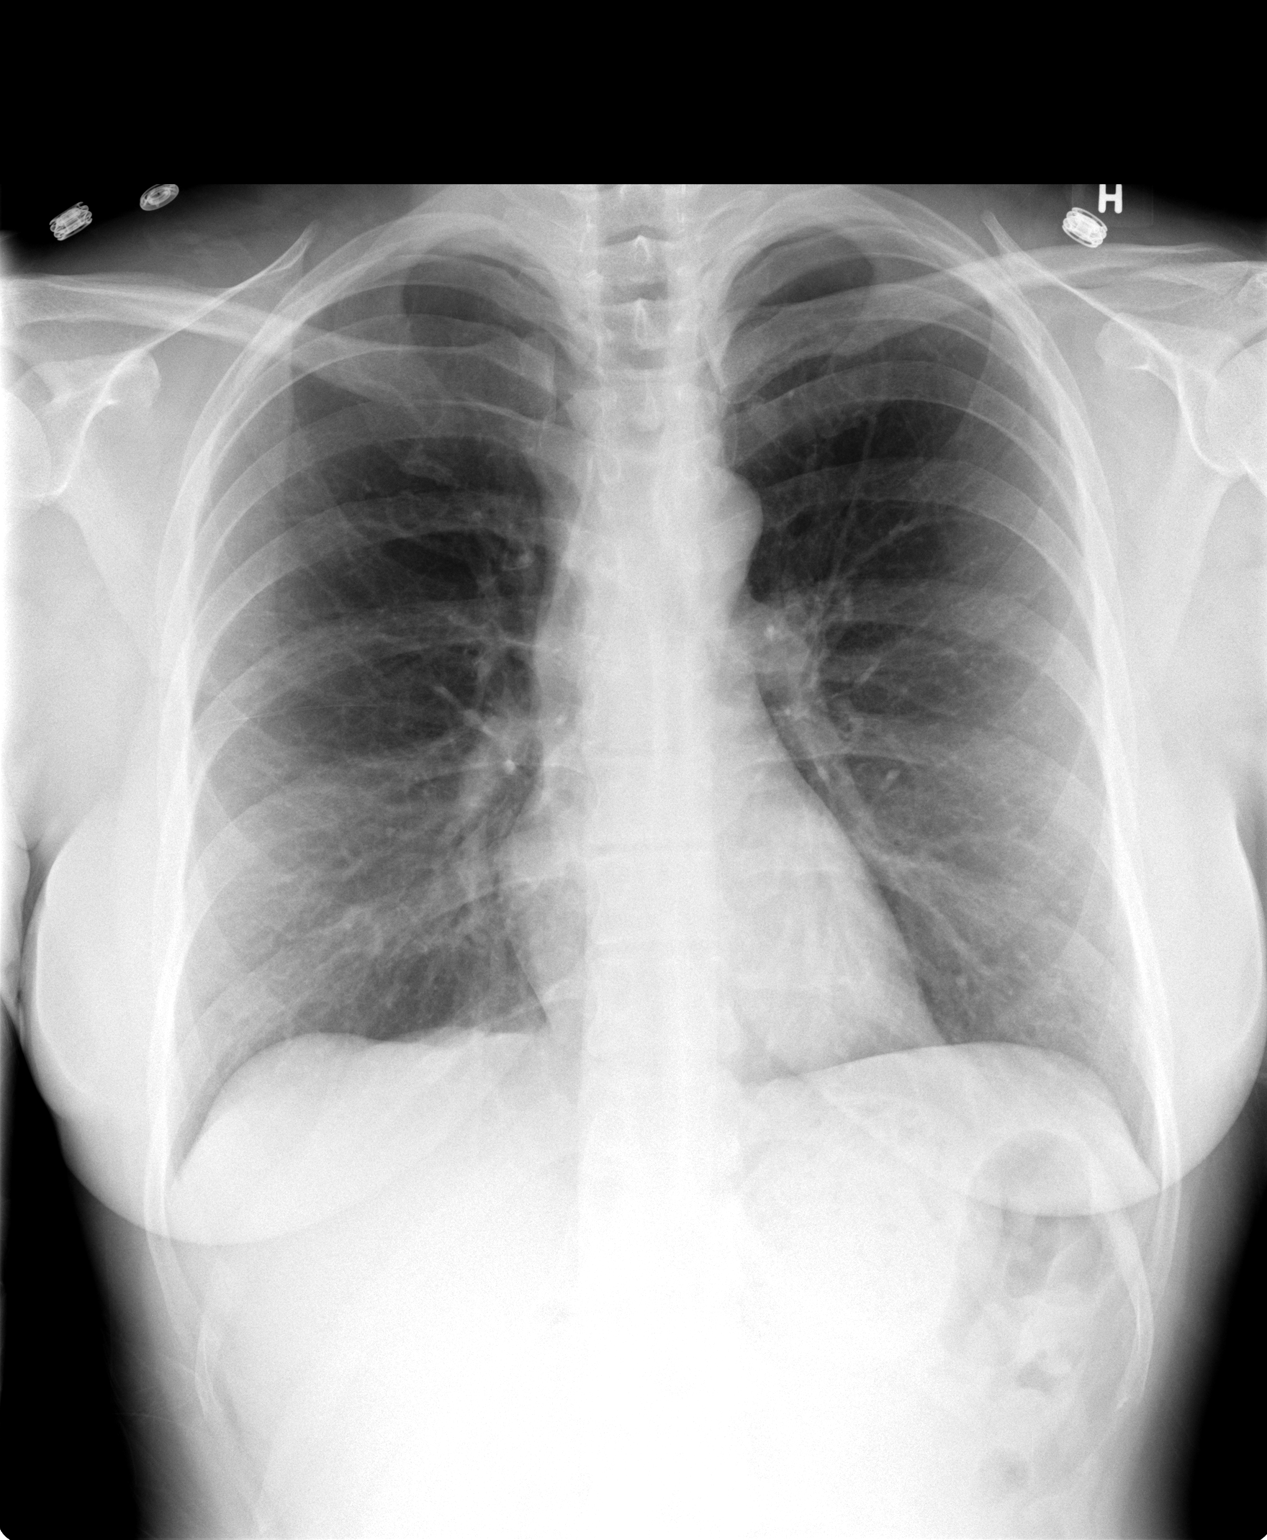

[view not recorded (2 of 2)]
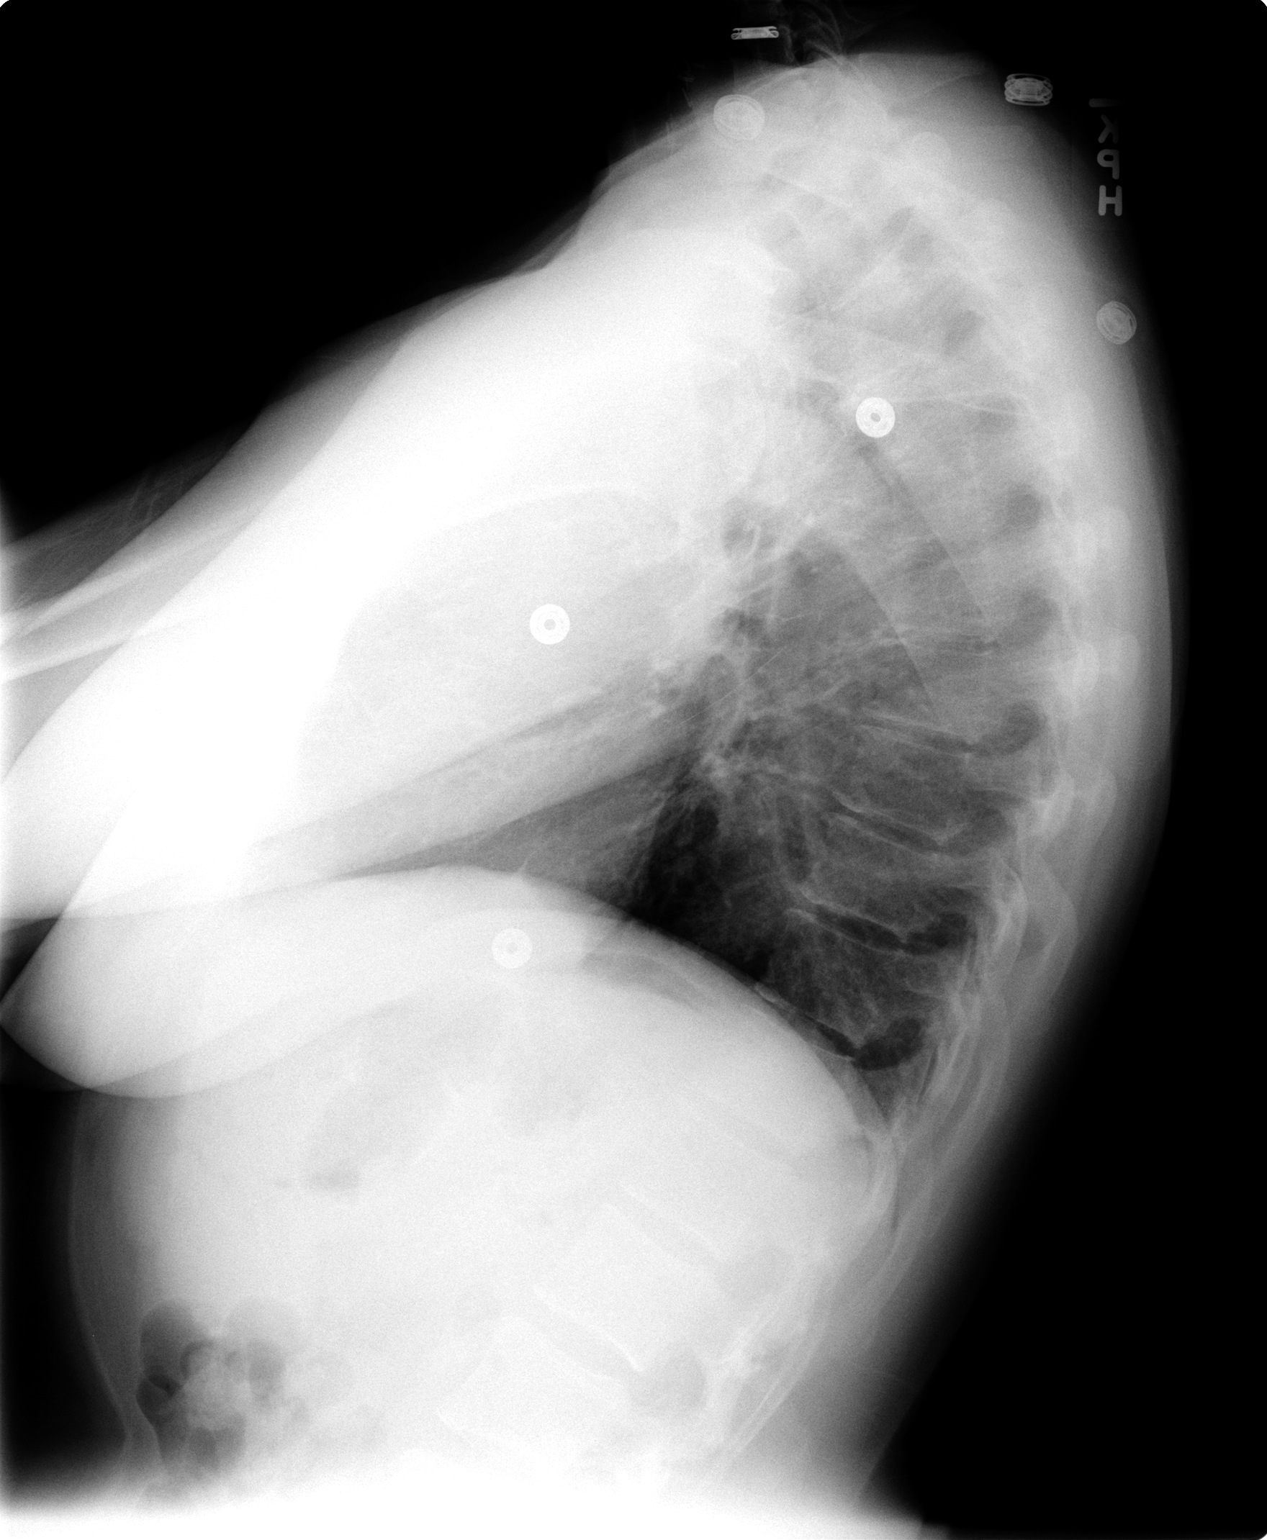

[2 of 2 positions shown; findings below may reference images not displayed]

## 2004-03-24 ENCOUNTER — Encounter: Admission: RE | Admit: 2004-03-24 | Discharge: 2004-03-24 | Payer: Self-pay | Admitting: Obstetrics and Gynecology

## 2004-03-26 ENCOUNTER — Other Ambulatory Visit: Admission: RE | Admit: 2004-03-26 | Discharge: 2004-03-26 | Payer: Self-pay | Admitting: Obstetrics and Gynecology

## 2004-04-03 ENCOUNTER — Encounter: Admission: RE | Admit: 2004-04-03 | Discharge: 2004-04-03 | Payer: Self-pay | Admitting: Obstetrics and Gynecology

## 2004-04-07 ENCOUNTER — Encounter: Admission: RE | Admit: 2004-04-07 | Discharge: 2004-04-07 | Payer: Self-pay | Admitting: Internal Medicine

## 2004-05-07 ENCOUNTER — Ambulatory Visit: Payer: Self-pay

## 2005-02-23 ENCOUNTER — Ambulatory Visit: Payer: Self-pay | Admitting: Internal Medicine

## 2005-02-24 ENCOUNTER — Encounter: Admission: RE | Admit: 2005-02-24 | Discharge: 2005-02-24 | Payer: Self-pay | Admitting: Obstetrics and Gynecology

## 2005-03-24 ENCOUNTER — Ambulatory Visit: Payer: Self-pay | Admitting: Internal Medicine

## 2005-03-24 ENCOUNTER — Encounter (INDEPENDENT_AMBULATORY_CARE_PROVIDER_SITE_OTHER): Payer: Self-pay | Admitting: *Deleted

## 2005-04-14 ENCOUNTER — Ambulatory Visit: Payer: Self-pay | Admitting: Internal Medicine

## 2005-06-01 ENCOUNTER — Other Ambulatory Visit: Admission: RE | Admit: 2005-06-01 | Discharge: 2005-06-01 | Payer: Self-pay | Admitting: Addiction Medicine

## 2005-10-30 ENCOUNTER — Encounter: Admission: RE | Admit: 2005-10-30 | Discharge: 2005-10-30 | Payer: Self-pay | Admitting: Surgery

## 2005-10-30 IMAGING — MG MM DIAGNOSTIC UNILATERAL R
3 series · 3 of 3 positions shown · non-contrast
Comparison: none

DG DIAGNOSTIC UNILATERAL R
CC and MLO view(s) were taken of the right breast.

RIGHT BREAST ULTRASOUND
DIGITAL UNILATERAL RIGHT DIAGNOSTIC MAMMOGRAM WITH CAD AND RIGHT BREAST ULTRASOUND:
CLINICAL DATA: Lump under right arm.

[R CC]
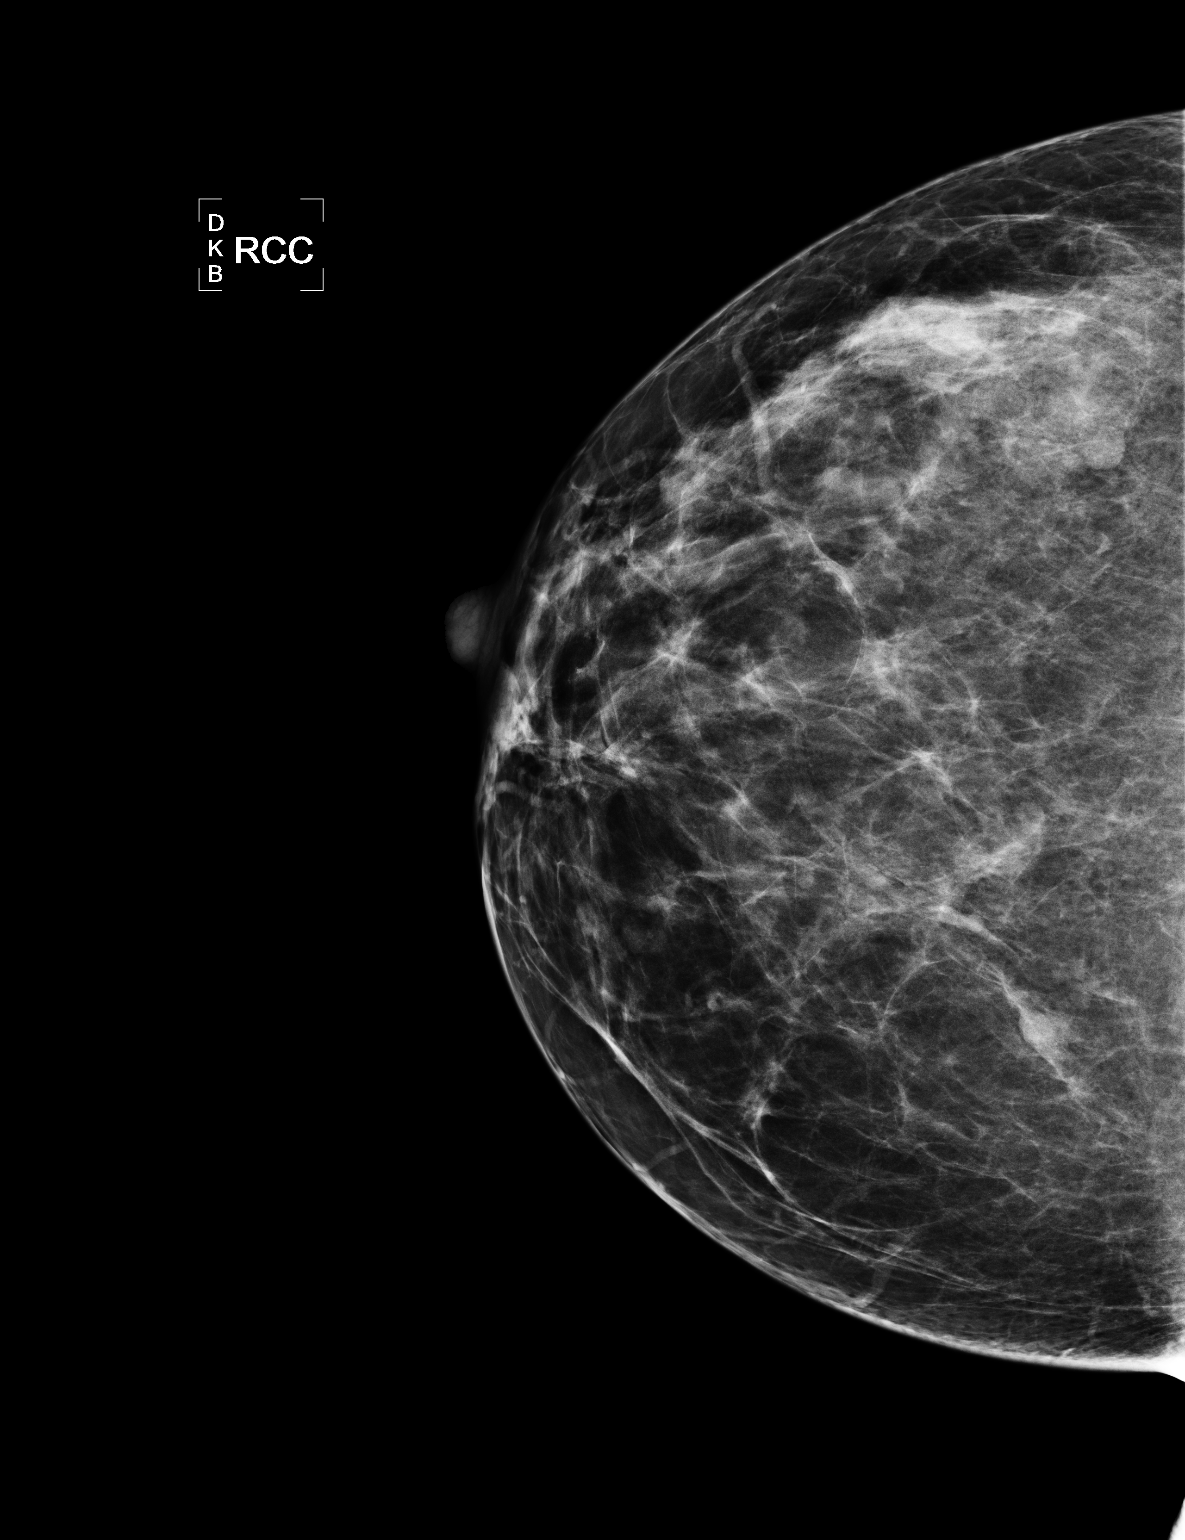

[R MLO]
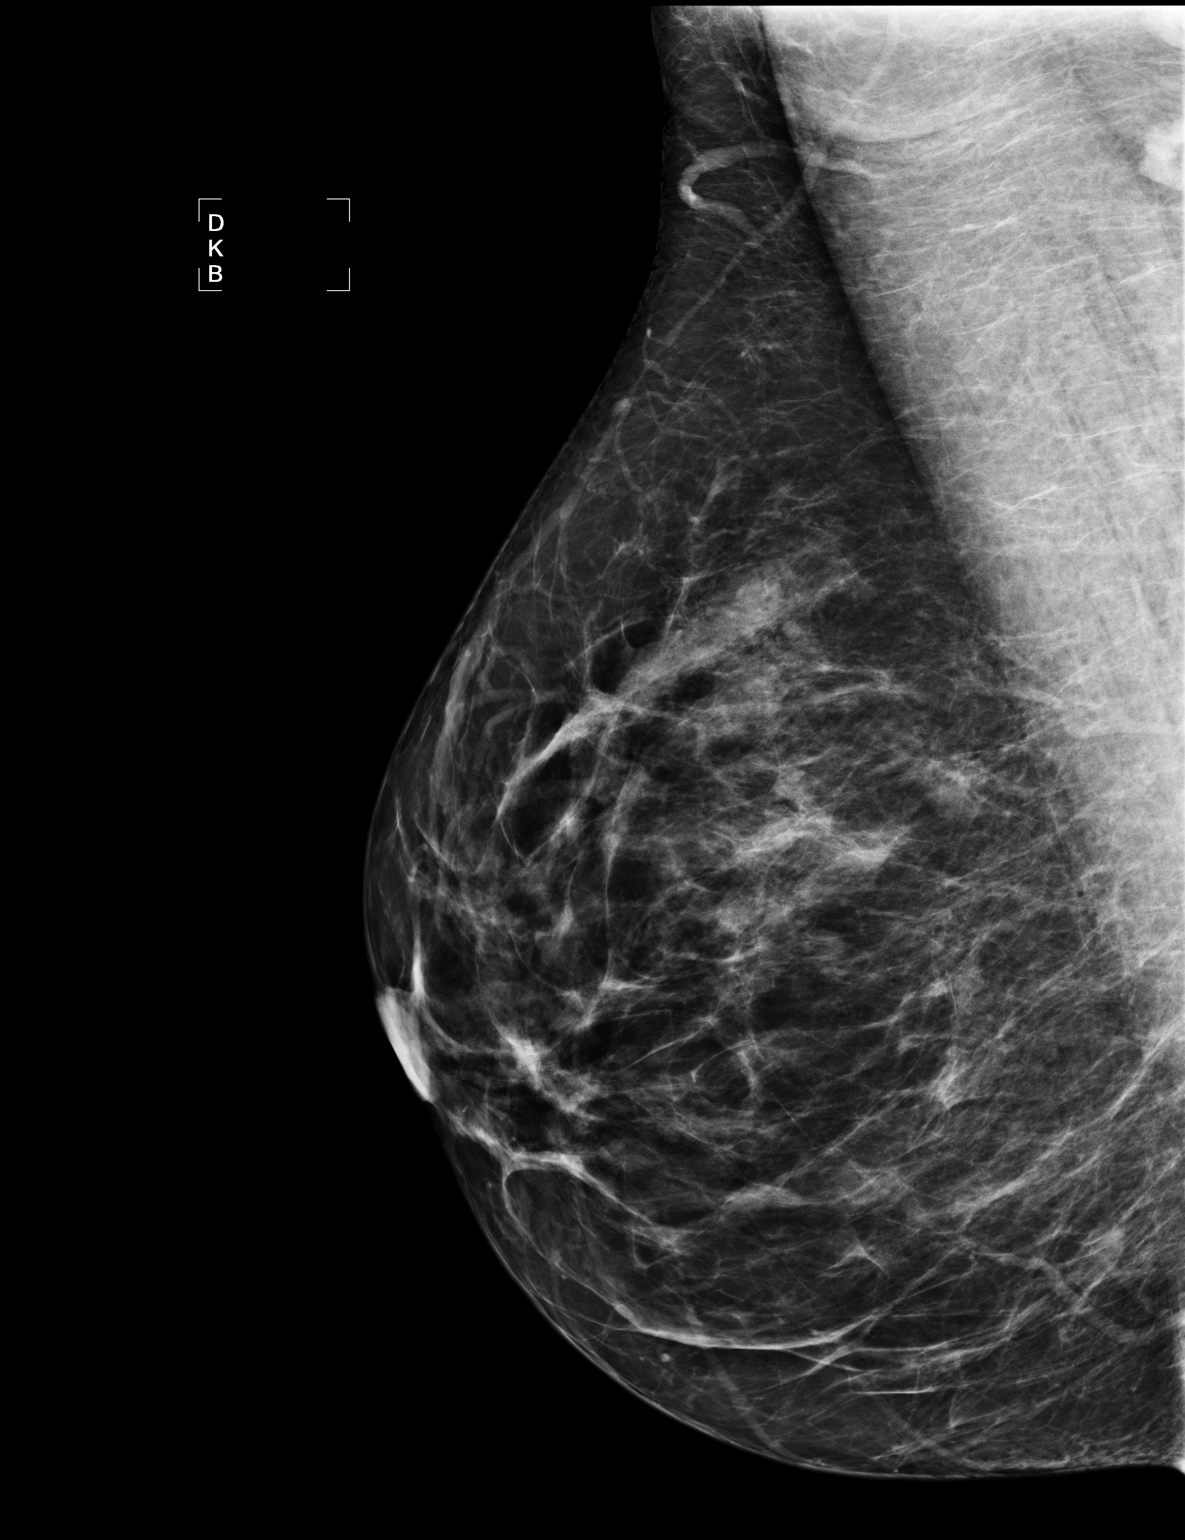

[R TAN]
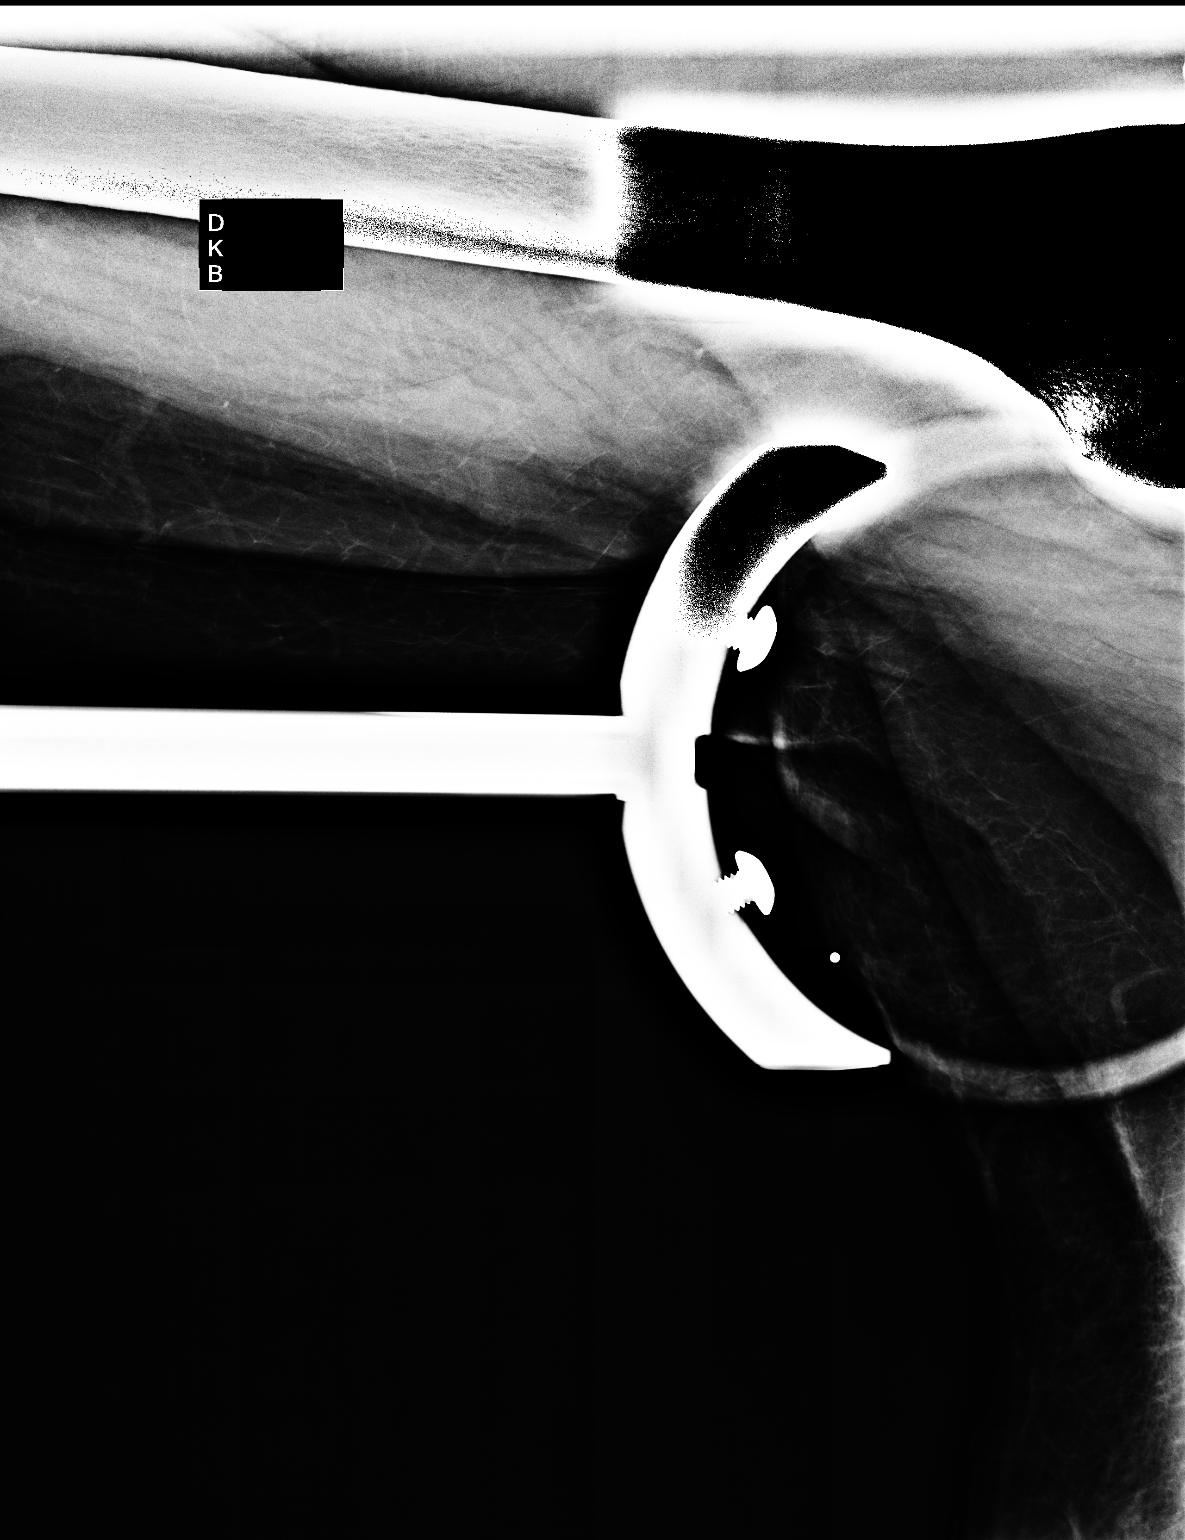

[3 of 3 positions shown; findings below may reference images not displayed]

Comparison study is dated [DATE].  The fibroglandular parenchymal pattern within the right breast
is stable.  No dominant masses, suspicious calcifications, or areas of architectural distortion 
are seen.  Spot compression view of the right axilla reveals fatty tissue only.

Physical exam of the right axilla reveals firm tissue.  Ultrasound of this region reveals fatty 
tissue only.  No suspicious solid masses or adenopathy are present.
IMPRESSION: There is no specific radiographic evidence of malignancy within the right breast or right axilla.  
Bilateral screening mammogram in [DATE] recommended.

ASSESSMENT: Negative - BI-RADS 1

Screening mammogram of both breasts in 4 months.
ANALYZED BY COMPUTER AIDED DETECTION. ,

## 2005-10-30 IMAGING — CT CT NECK W/ CM
3 of 4 series · 11 of 20 positions shown, 13 images · IV contrast (100 ML OMNI 300)
Comparison: [HOSPITAL] at [HOSPITAL] neck CT, [DATE].

CLINICAL DATA: Left posterior cervical lymph nodes.  History of thyroidectomy.
NECK CT WITH CONTRAST:
TECHNIQUE: Multidetector CT imaging of the neck was performed following the standard protocol during administration of intravenous contrast.
Contrast:  100 cc of Omnipaque 300.

[Series 2: neck · axial · 0.39mm/px · z∈[+46,+231]mm · 8 of 96 slices shown, 10 images]
[im 11/96  soft-tissue]
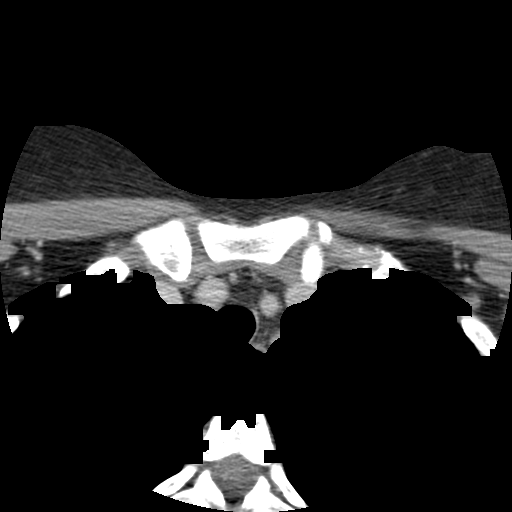
[im 11/96  bone]
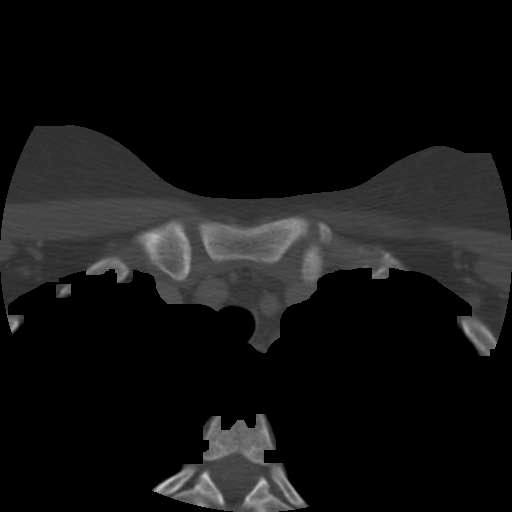
[im 22/96  bone]
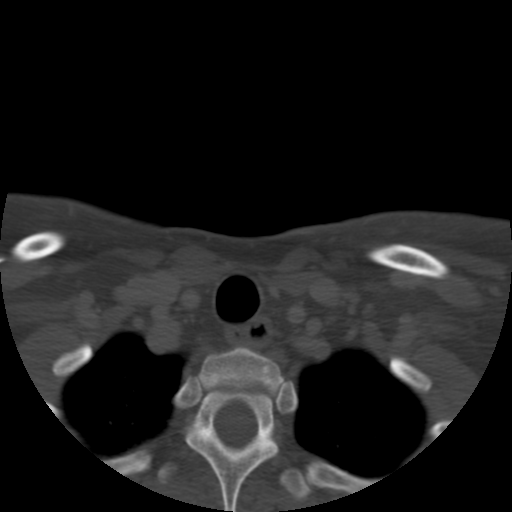
[im 32/96  bone]
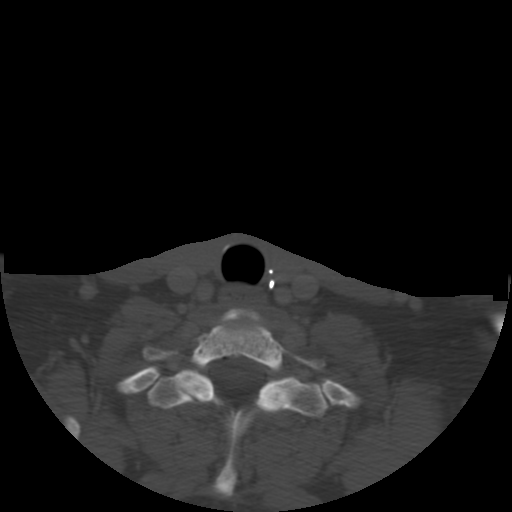
[im 43/96  bone]
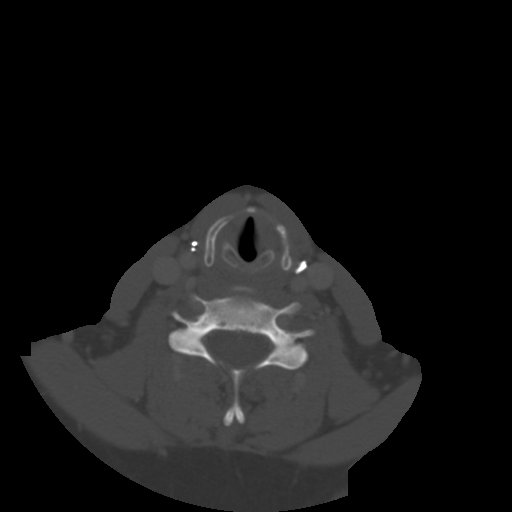
[im 53/96  soft-tissue]
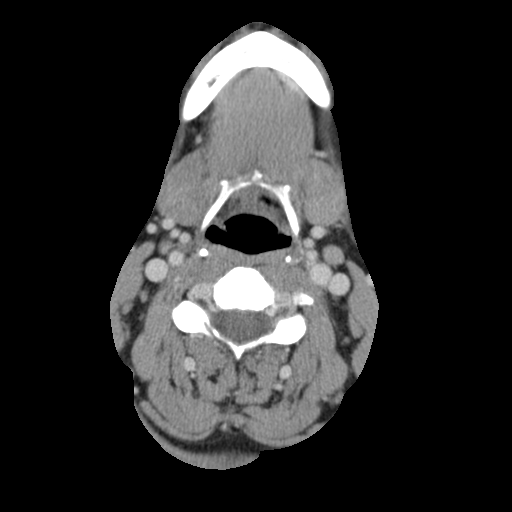
[im 53/96  bone]
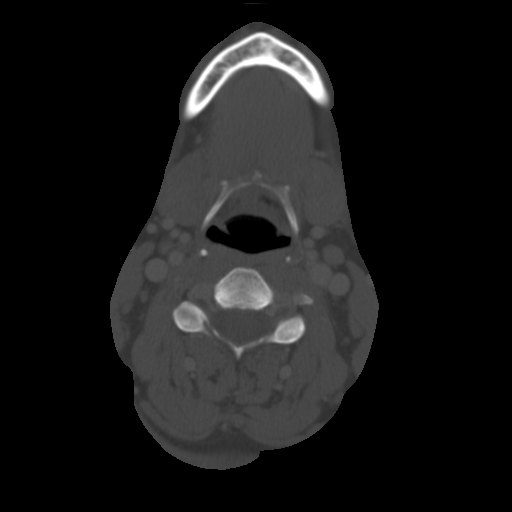
[im 64/96  bone]
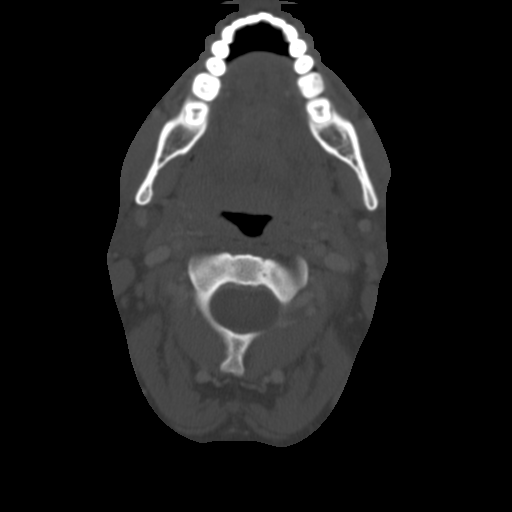
[im 74/96  bone]
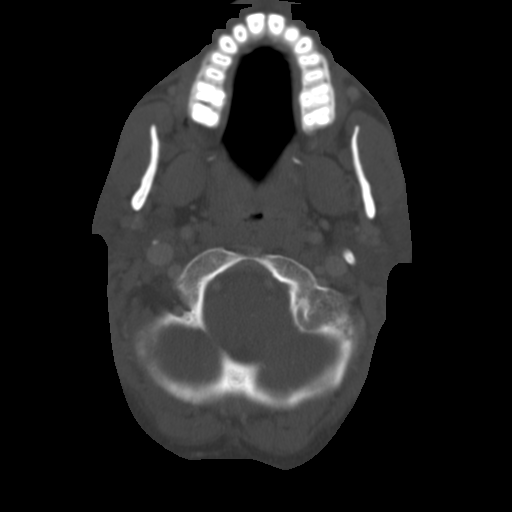
[im 85/96  bone]
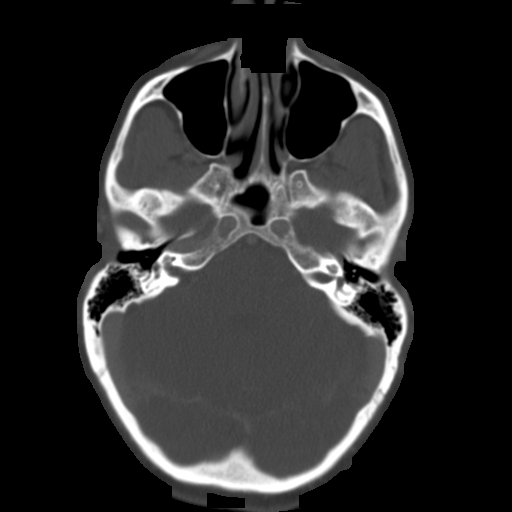

[Series 3: recon 2: neck · axial · 0.53mm/px · z∈[+56,+88]mm · 2 of 41 slices shown]
[im 14/41  bone]
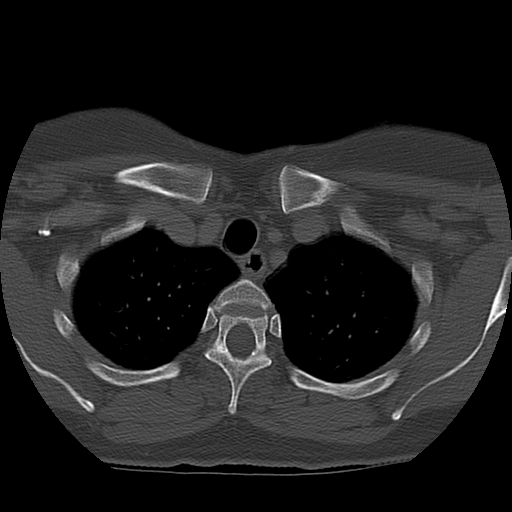
[im 27/41  bone]
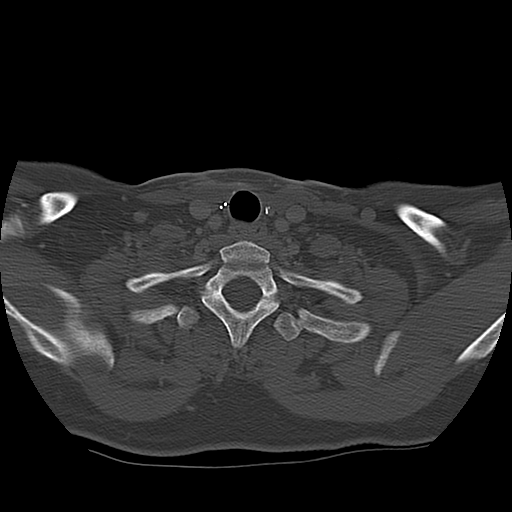

[Series 268: reformatted · coronal · 0.46mm/px · 1 of 76 slices shown]
[im 38/76  bone]
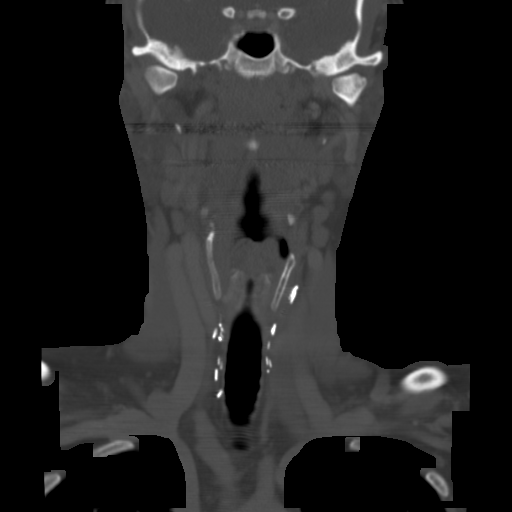

[11 of 20 positions shown; findings below may reference images not displayed]

FINDINGS: Stable cervical lymph nodes are seen (currently slightly better visualized with sagittal and coronal multiplanar CT images), most prominently at the jugulodigastric/level II measuring 1.4 cm long by 1 cm AP by 1.4 cm wide (image 37 coronal, axial 38).  Similar lymph node axially previously measured 1 cm AP by 1.5 cm wide (prior image 39).  Slight bilateral level III/ internal jugular chain lymph nodes are seen, the largest at the left measuring 1.2 cm long by 0.6 cm AP by 0.9 cm wide (coronal image 40, axial image 43).  This previously measured 0.6 x 0.9 cm ? image 44.  Previous small left posterior neck lymph node is stable measuring 7 mm long by 4 mm AP by 6 mm wide (coronal image 62 and axial image 43) previously 6 mm.  No new adenopathy is seen through the cervical region nor superior mediastinum.  Patient is post thyroidectomy with clear lung apices.  Visualized portion of brain, paranasal sinuses, bilateral mastoid air cells, naso-oro-hypopharynx, larynx, and salivary glands appear normal.  Moderate C5-6 degenerative disk disease with slight posterior spondylosis noted without significant central canal stenosis.  Left C6-7 left lateral recess is slightly narrowed due to uncinate degenerative joint disease.
IMPRESSION: 1.  Stable cervical lymph nodessince [DATE] favoring benign reactive adenitis.
2.  Thyroidectomy.
3.  Degenerative disk and slight posterior spondylosis maximal at C5-6 and lesser C6-7, as described.
4.  Otherwise no significant abnormality.

## 2006-03-18 ENCOUNTER — Encounter: Admission: RE | Admit: 2006-03-18 | Discharge: 2006-03-18 | Payer: Self-pay | Admitting: Obstetrics and Gynecology

## 2006-03-18 IMAGING — MG MM DIAGNOSTIC BILATERAL
5 series · 5 of 5 positions shown · non-contrast
Comparison: Unilateral right mammogram [DATE] and bilateral mammography [DATE].

DG DIAGNOSTIC BILATERAL
Bilateral CC and MLO view(s) were taken.

LEFT BREAST ULTRASOUND
DIGITAL BILATERAL  DIAGNOSTIC MAMMOGRAM WITH CAD AND LEFT BREAST ULTRASOUND:
CLINICAL DATA: Questioned palpable finding left breast 3 o'clock location.

[R CC]
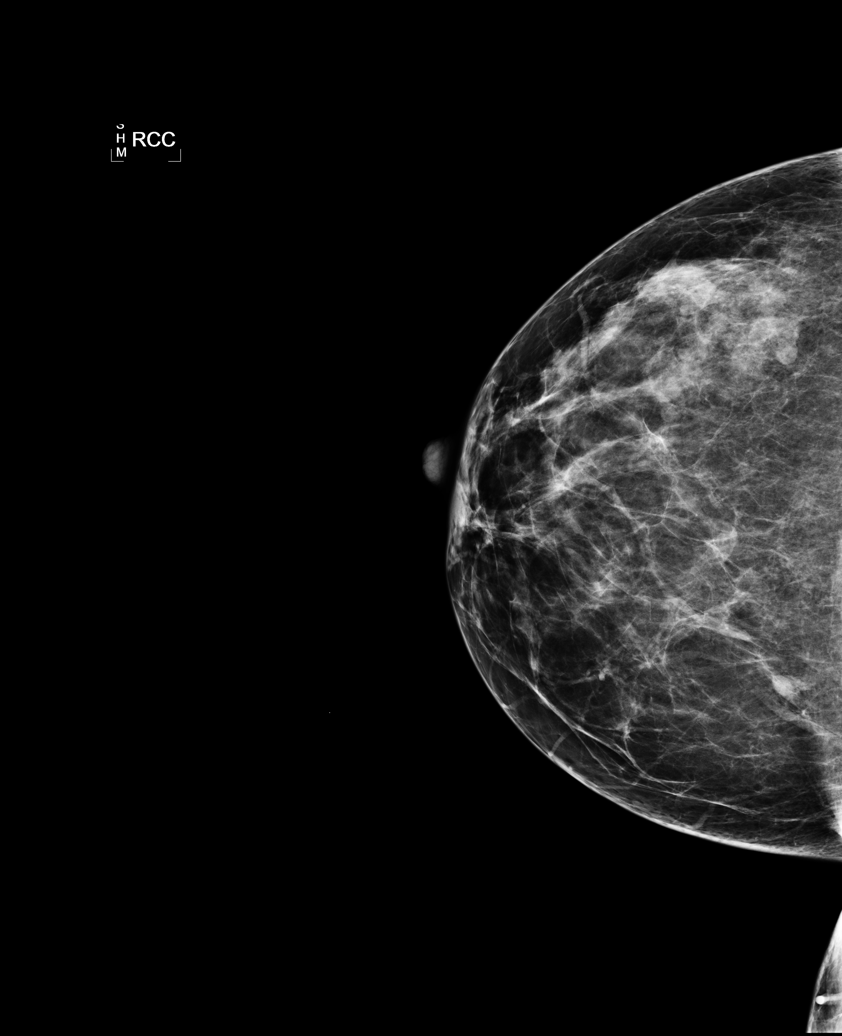

[L CC]
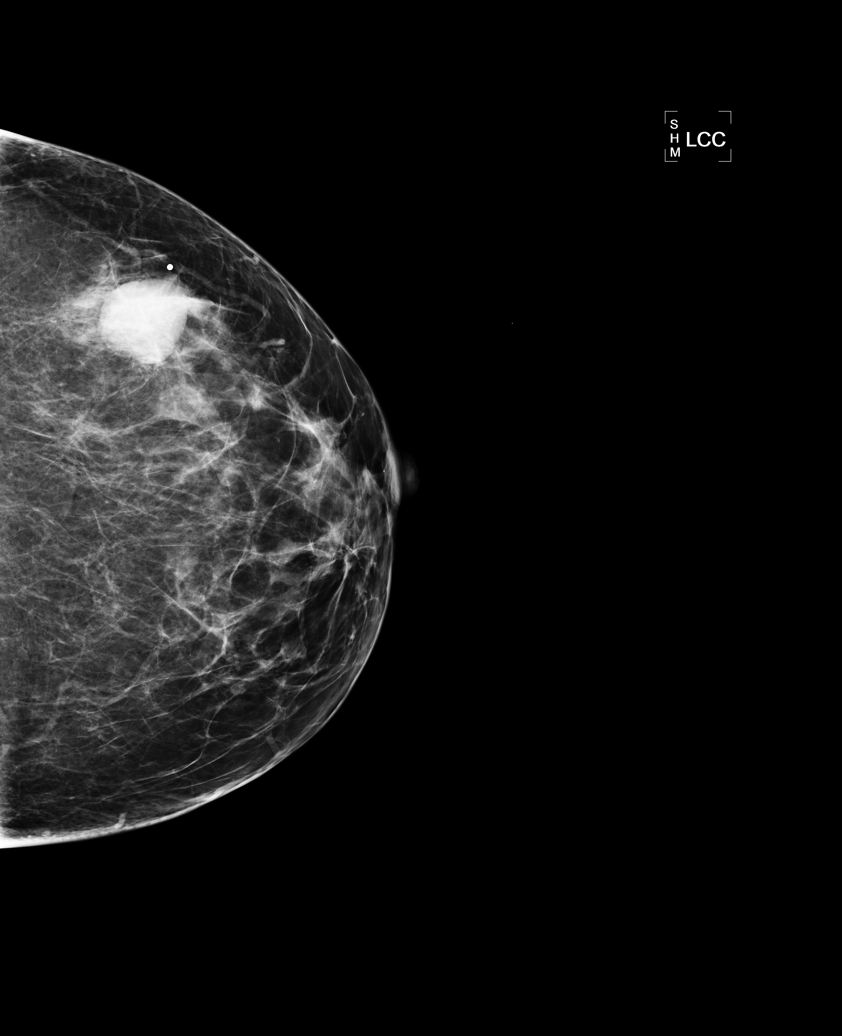

[L MLO]
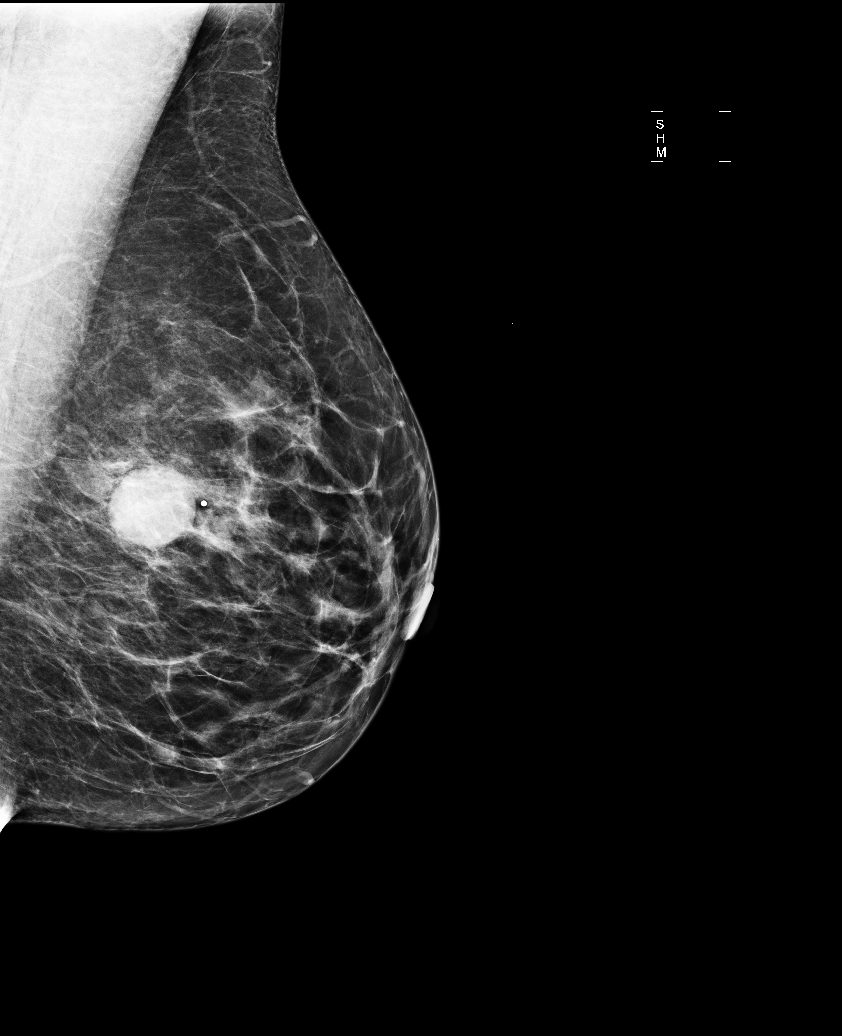

[R MLO]
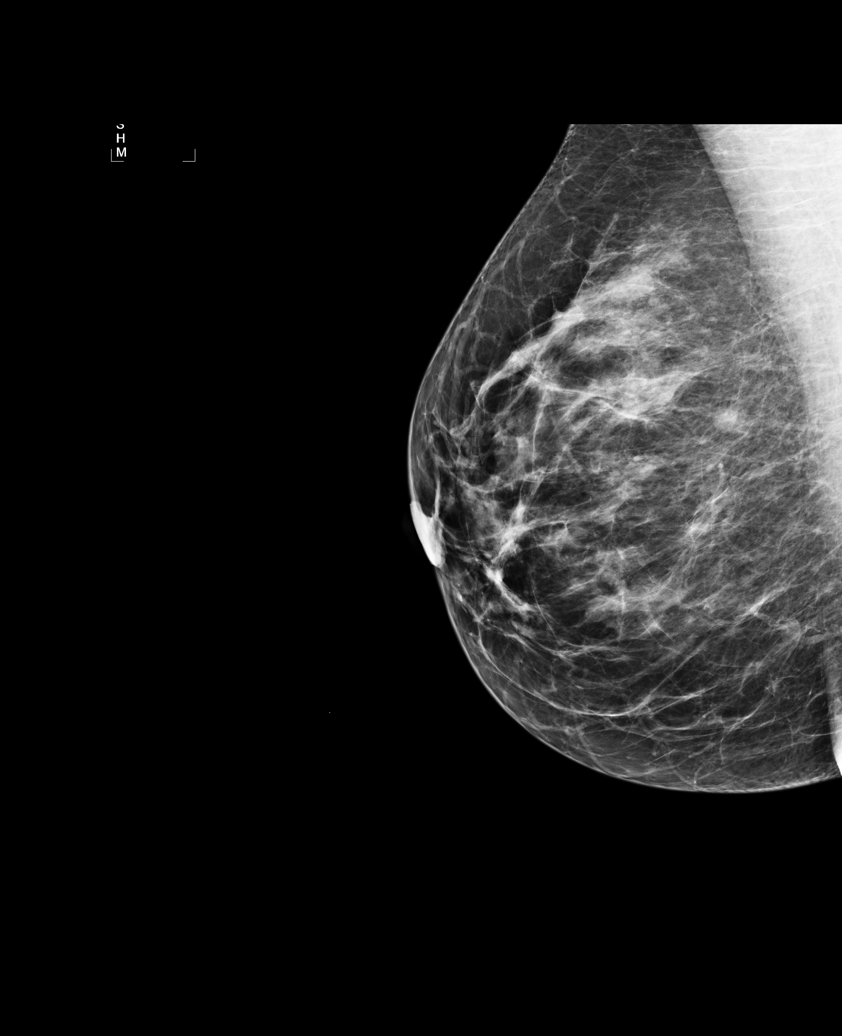

[L TAN]
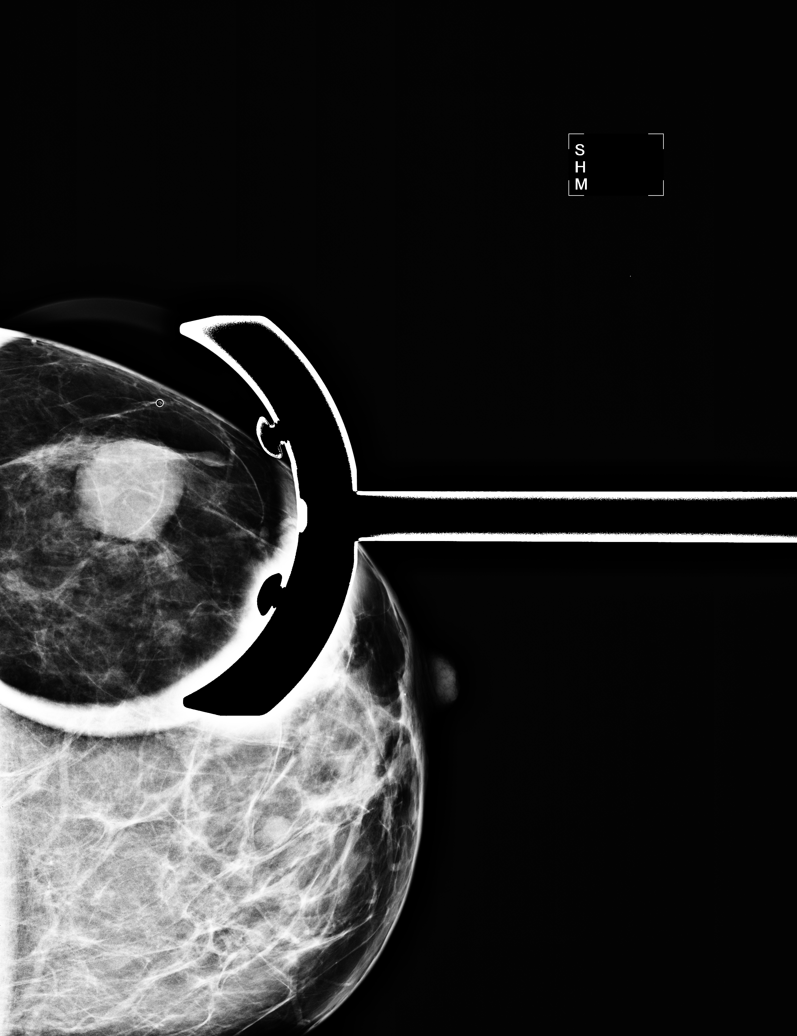

[5 of 5 positions shown; findings below may reference images not displayed]

The breast parenchyma demonstrates scattered fibroglandular densities and remains somewhat nodular,
unchanged.  There has been slight interval increase in size of the previously evaluated mass in 
the left breast 3 o'clock location, previously demonstrated to be a cyst.

Ultrasound of the left breast 3 o'clock location demonstrates an anechoic simple-appearing cyst 
measuring 1.9 x 1.9 x 1.8 cm.
IMPRESSION: Simple-appearing cyst in the left breast 3 o'clock location corresponds to the questioned palpable 
finding. I offered cyst aspiration but the patient declined.

No mammographic evidence for malignancy in the right breast.  Screening mammography recommended in 
one year.

ASSESSMENT: Benign - BI-RADS 2

Routine screening mammogram in 1 year.
,

## 2006-03-18 IMAGING — US UNKNOWN US STUDY
1 series · 4 of 4 positions shown · non-contrast
Comparison: Unilateral right mammogram [DATE] and bilateral mammography [DATE].

DG DIAGNOSTIC BILATERAL
Bilateral CC and MLO view(s) were taken.

LEFT BREAST ULTRASOUND
DIGITAL BILATERAL  DIAGNOSTIC MAMMOGRAM WITH CAD AND LEFT BREAST ULTRASOUND:
CLINICAL DATA: Questioned palpable finding left breast 3 o'clock location.

[Series 1: unknown us study · 4 of 4 slices shown]
[im 1/4]
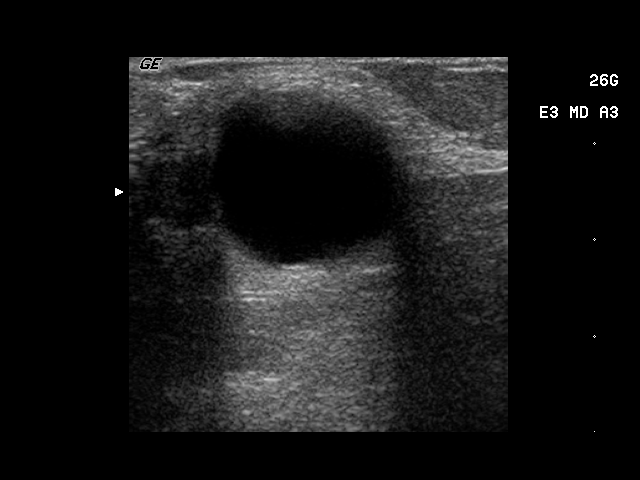
[im 2/4]
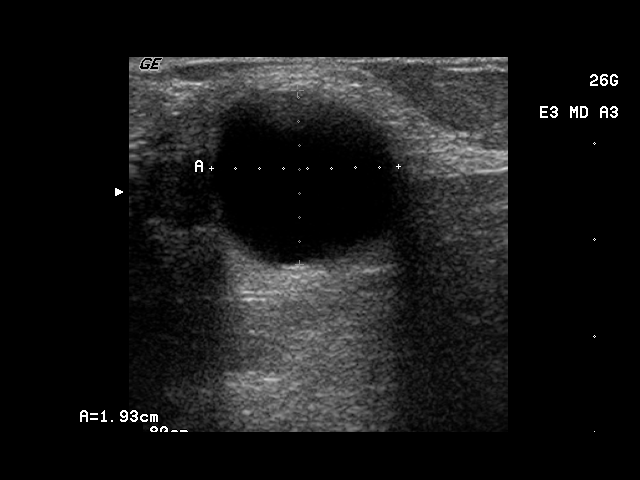
[im 3/4]
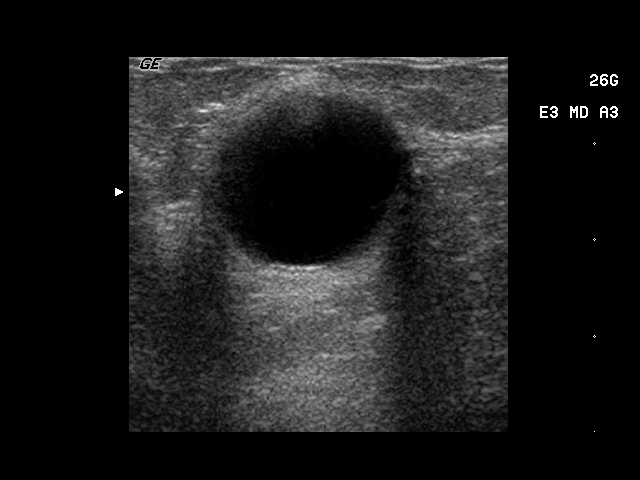
[im 4/4]
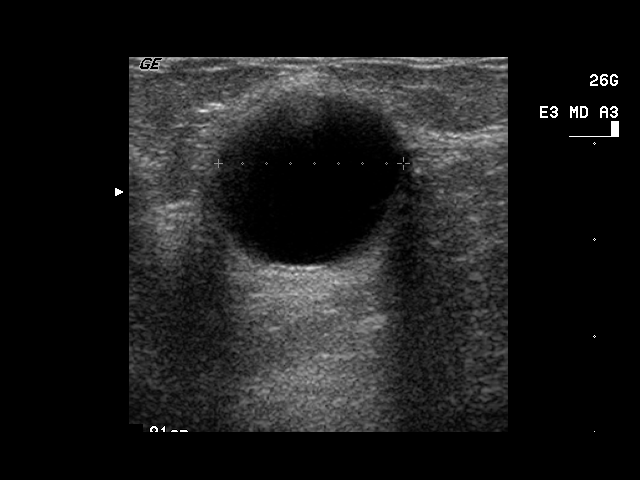

[4 of 4 positions shown; findings below may reference images not displayed]

The breast parenchyma demonstrates scattered fibroglandular densities and remains somewhat nodular,
unchanged.  There has been slight interval increase in size of the previously evaluated mass in 
the left breast 3 o'clock location, previously demonstrated to be a cyst.

Ultrasound of the left breast 3 o'clock location demonstrates an anechoic simple-appearing cyst 
measuring 1.9 x 1.9 x 1.8 cm.
IMPRESSION: Simple-appearing cyst in the left breast 3 o'clock location corresponds to the questioned palpable 
finding. I offered cyst aspiration but the patient declined.

No mammographic evidence for malignancy in the right breast.  Screening mammography recommended in 
one year.

ASSESSMENT: Benign - BI-RADS 2

Routine screening mammogram in 1 year.
,

## 2006-04-07 ENCOUNTER — Encounter: Admission: RE | Admit: 2006-04-07 | Discharge: 2006-04-07 | Payer: Self-pay | Admitting: Obstetrics and Gynecology

## 2006-04-07 IMAGING — US UNKNOWN US STUDY
1 series · 7 of 7 positions shown · non-contrast
Comparison: none

US GUIDED ASPIRATION
Technologist: DYFED, Medical

LEFT BREAST CYST ASPIRATION:
CLINICAL DATA: Painful, palpable left breast cyst.

[Series 1: unknown us study · 7 of 7 slices shown]
[im 1/7]
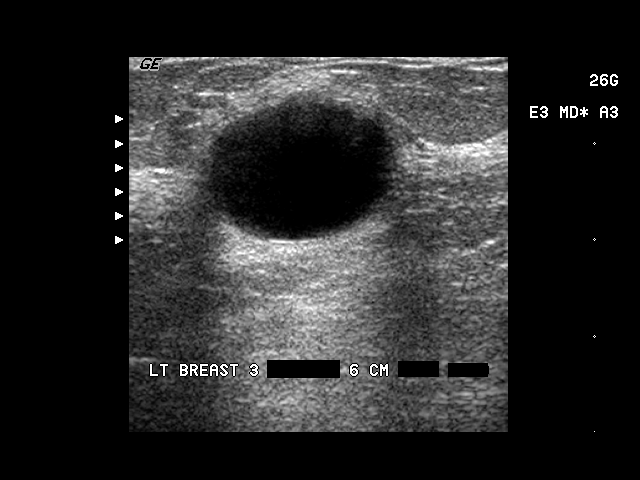
[im 2/7]
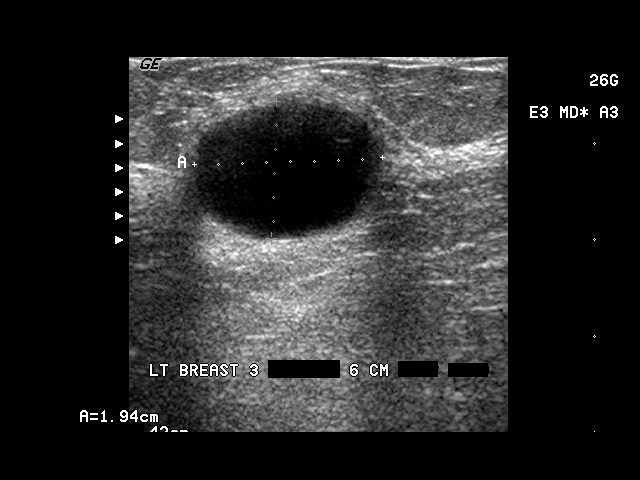
[im 3/7]
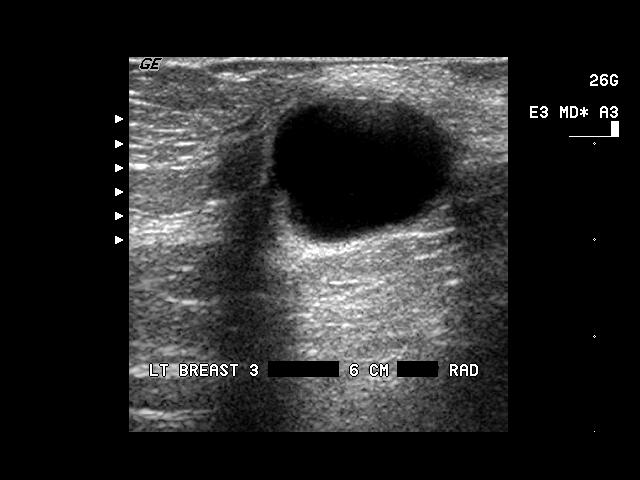
[im 4/7]
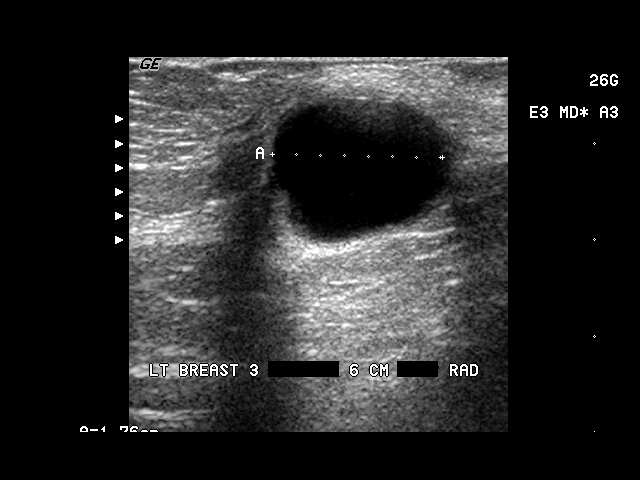
[im 5/7]
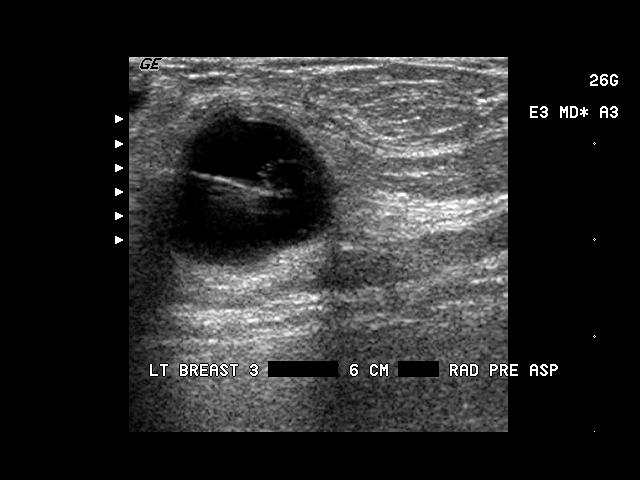
[im 6/7]
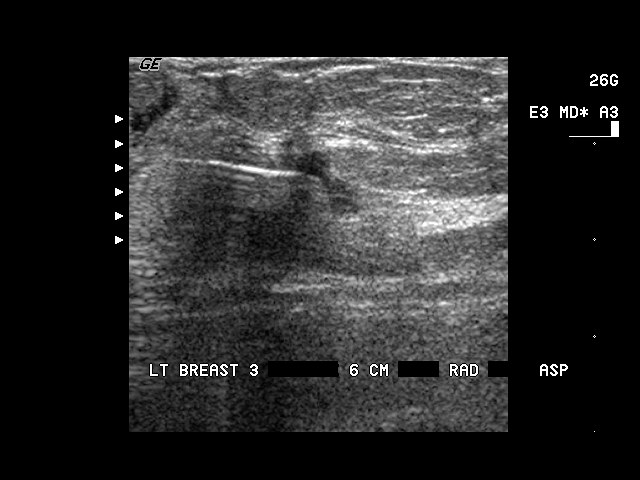
[im 7/7]
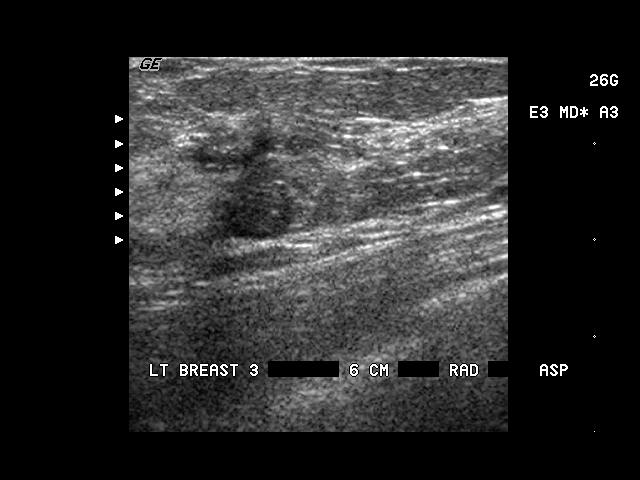

[7 of 7 positions shown; findings below may reference images not displayed]

On physical exam, I palpate a discrete mass in the left breast at 3 o'clock approximately 6 cm from
the nipple.  Sonographically, a simple cyst is imaged.  It measures 1.9 x 1.4 x 1.8 cm.

Left breast cyst aspiration was described to the patient.  Alternatives and complications 
discussed, questions answered, and verbal consent granted.   Standard sterile technique was 
utilized during the procedure.  7 cc of 2% Lidocaine was used for local anesthesia.  Using 
sonographic guidance, an 18-gauge needle was placed into the cyst at 3 o'clock and approximately 
3-1/2 cc of green fluid was aspirate.  The cyst completely decompressed.  No complications arose 
during the procedure.  The patient left our facility in good health.

IMPRESSION

Left breast cyst aspiration, as described.   The fluid was consistent with fibrocystic fluid and 
was not sent to cytology.

PATHOLOGY RESULTS: SPECIMEN NOT SENT TO LAB

,

## 2007-05-31 ENCOUNTER — Encounter: Admission: RE | Admit: 2007-05-31 | Discharge: 2007-05-31 | Payer: Self-pay | Admitting: Obstetrics and Gynecology

## 2007-05-31 IMAGING — MG MM DIAGNOSTIC BILATERAL
4 series · 4 of 4 positions shown · non-contrast
Comparison: none

DG DIAGNOSTIC BILATERAL
Bilateral CC and MLO view(s) were taken.

RIGHT BREAST ULTRASOUND
DIGITAL BILATERAL  DIAGNOSTIC MAMMOGRAM WITH CAD AND RIGHT BREAST ULTRASOUND:
CLINICAL DATA: Tenderness within the inferior right breast extending into the superior right 
breast.  History of bilateral breast cysts with aspiration of a benign left breast cyst last year.

[R CC]
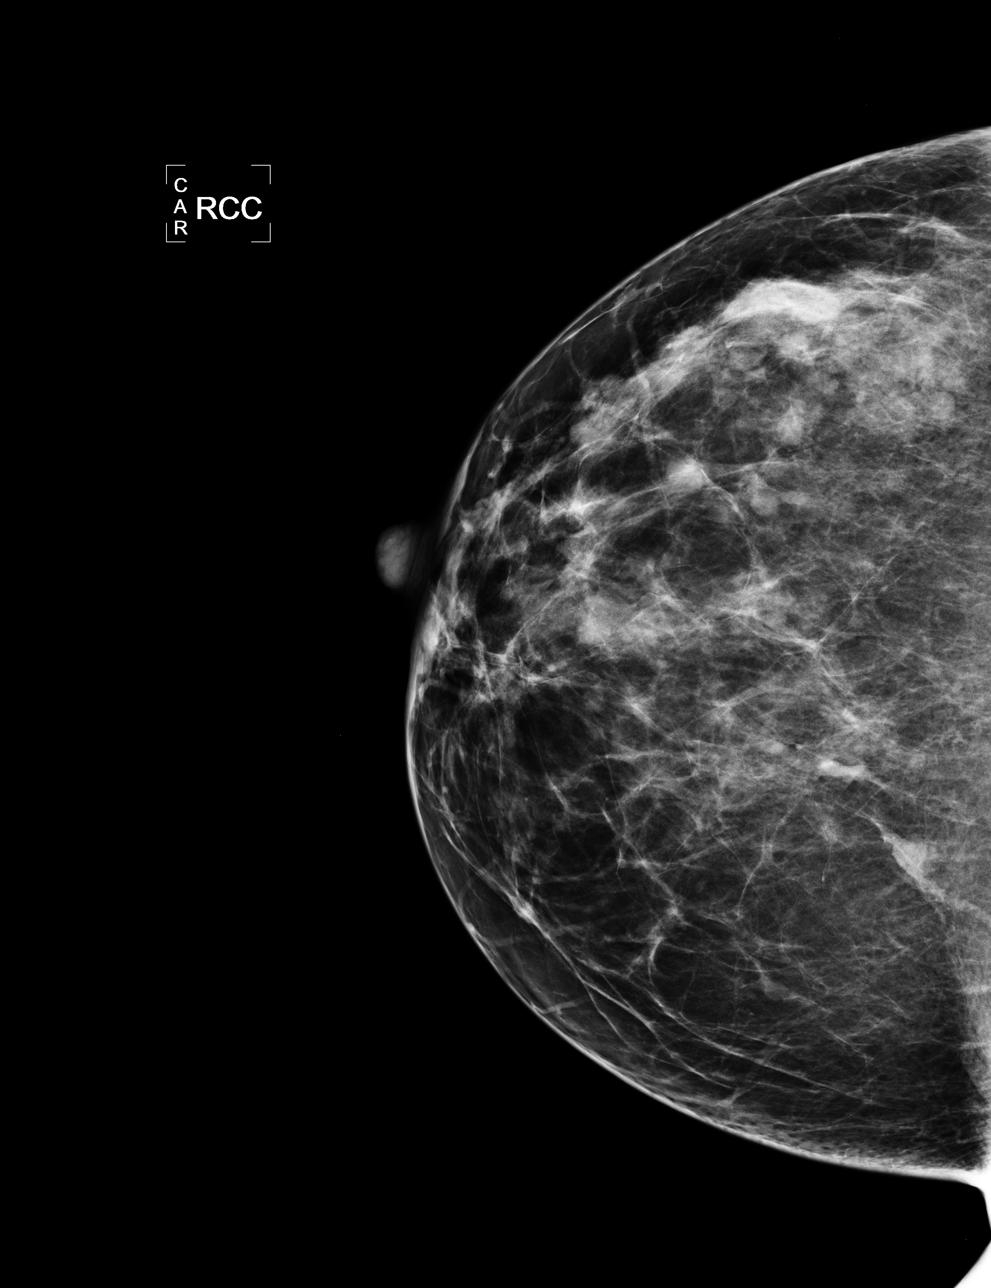

[L CC]
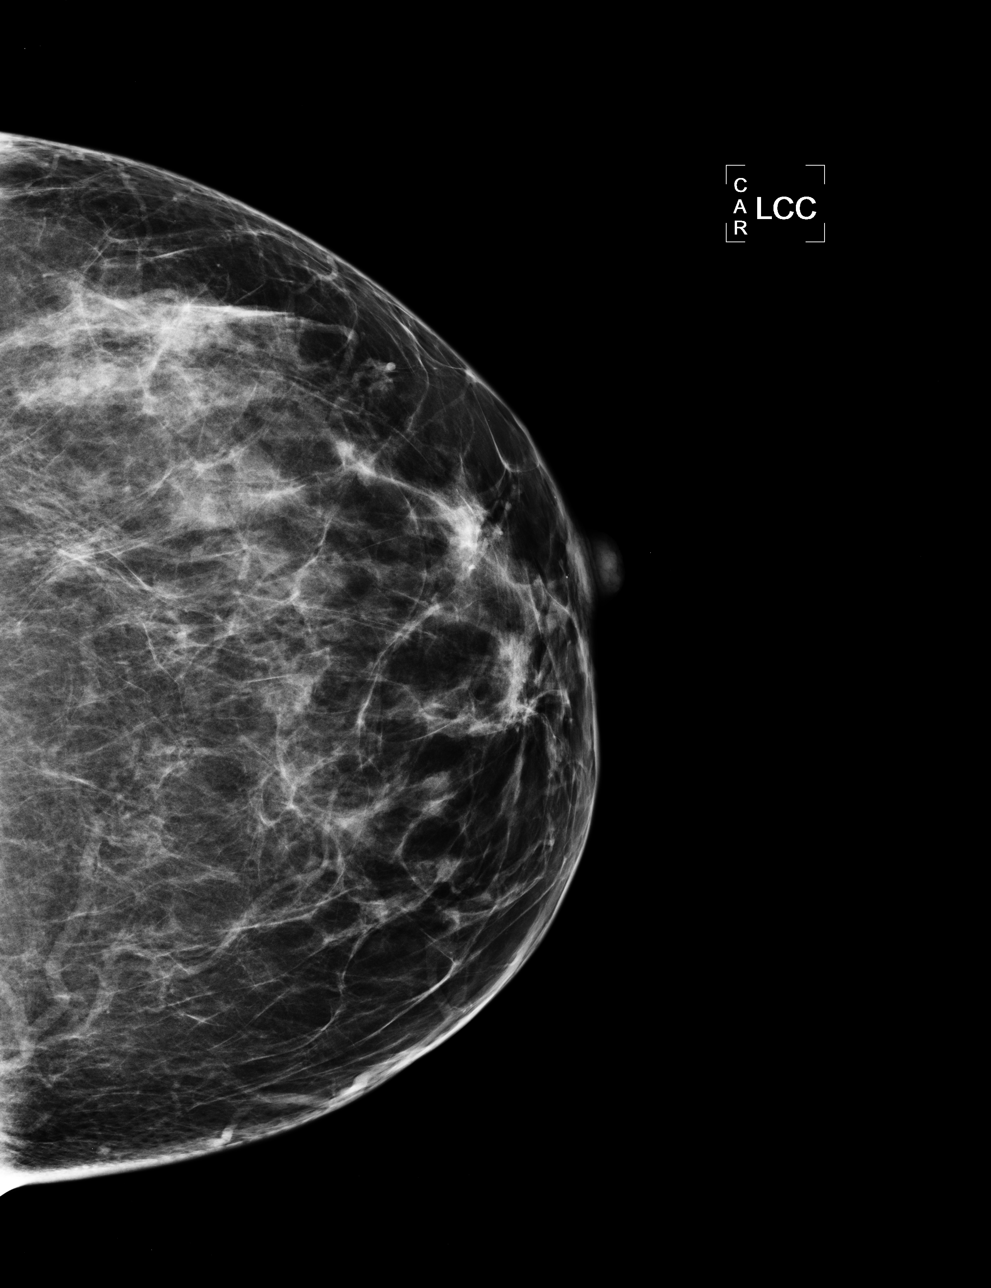

[L MLO]
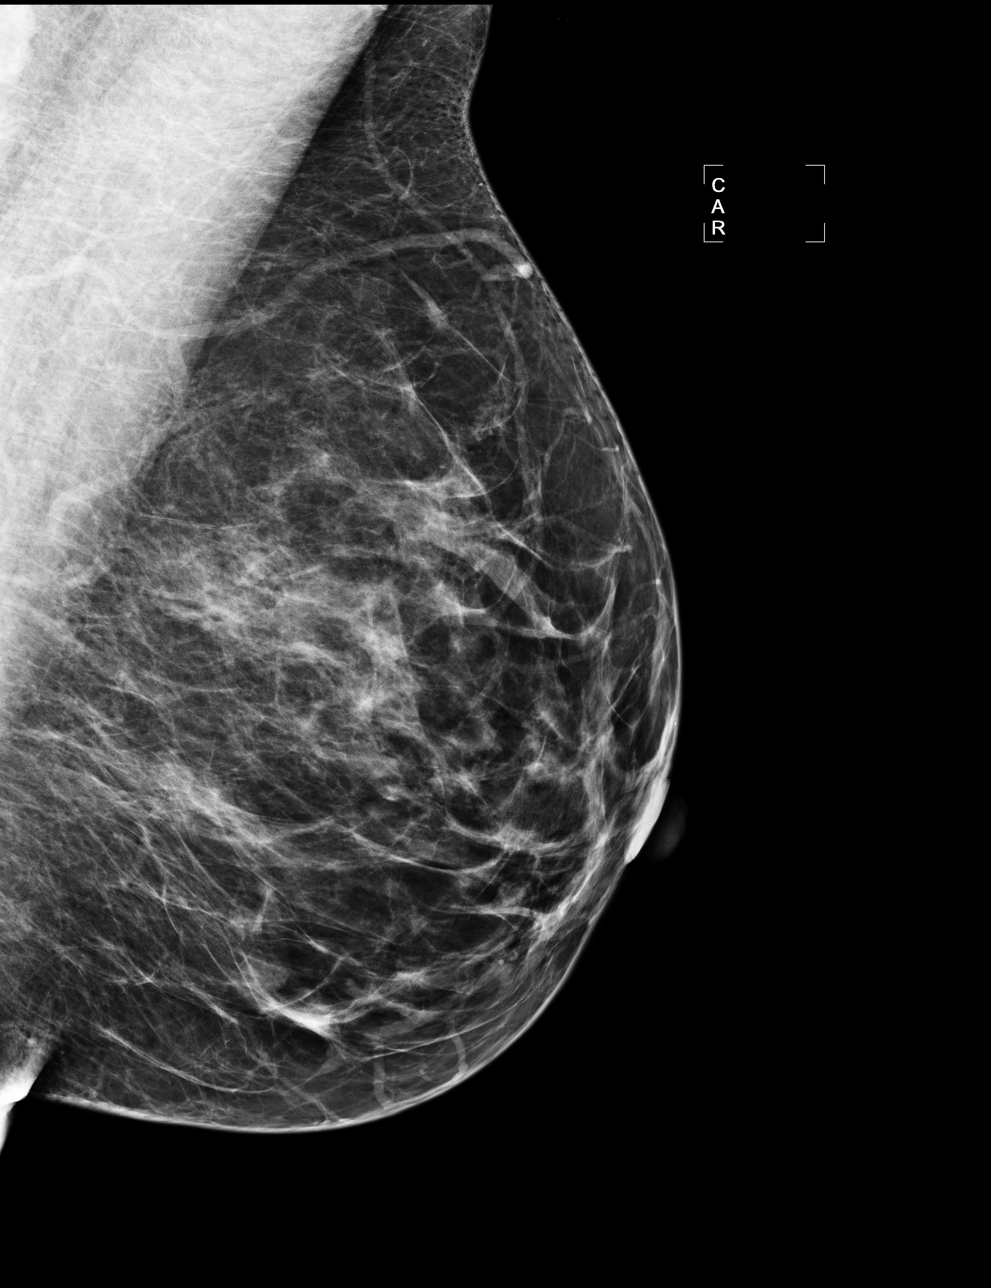

[R MLO]
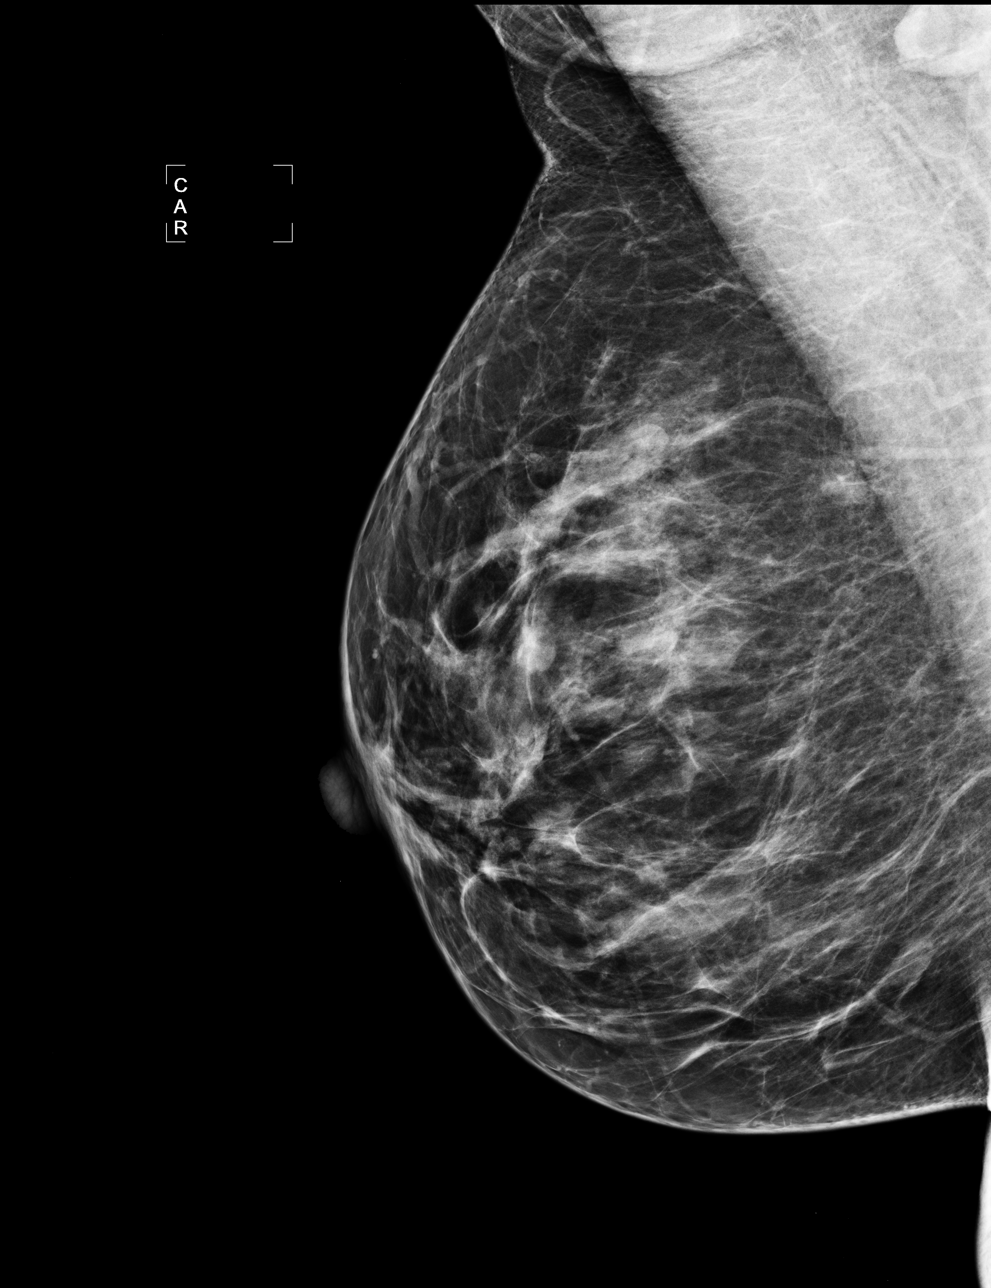

[4 of 4 positions shown; findings below may reference images not displayed]

The simple cyst previously seen within the left breast at the 3 o'clock position has been aspirated
and is no longer present.  There are small nodular densities seen laterally within the right 
breast which appear stable.  However, there is a new small nodular density located at approximately
the 3 o'clock position within the right breast.  The remainder of the breast parenchyma is 
unchanged and there are no suspicious microcalcifications or areas of distortion.

Right breast ultrasound was performed and this demonstrated multiple simple cysts located at the 10
o'clock position within the right breast approximately 3 cm from the nipple.  These correspond to 
the nodular densities seen on mammography. There is no solid mass, distortion, or abnormal 
shadowing.
IMPRESSION: Multiple small simple cysts located within the right breast at approximately the 10 o'clock 
position.  The largest cyst measures 3 x 8 mm in size and likely accounts for the nodular densities
seen on mammography.  No specific evidence for malignancy.  Recommend annual screening 
mammography.

ASSESSMENT: Benign - BI-RADS 2

Routine screening mammogram in 1 year.
ANALYZED BY COMPUTER AIDED DETECTION. ,

## 2008-12-22 ENCOUNTER — Observation Stay: Payer: Self-pay | Admitting: Internal Medicine

## 2008-12-22 ENCOUNTER — Ambulatory Visit: Payer: Self-pay | Admitting: Internal Medicine

## 2008-12-22 ENCOUNTER — Encounter: Payer: Self-pay | Admitting: Cardiology

## 2008-12-22 IMAGING — CT CT HEAD WITHOUT CONTRAST
2 series · 16 of 30 positions shown, 20 images · non-contrast
Comparison: none

REASON FOR EXAM: syncope
COMMENTS:

PROCEDURE:     CT  - CT HEAD WITHOUT CONTRAST  - [DATE]  [DATE]
RESULT:     Technique: Helical 5mm sections were obtained from the skull
base to the vertex without administration of intravenous contrast.

[Series 2: without · axial · non-contrast · 0.42mm/px · z∈[+885,+1010]mm · 13 of 31 slices shown, 17 images]
[im 3/31  brain]
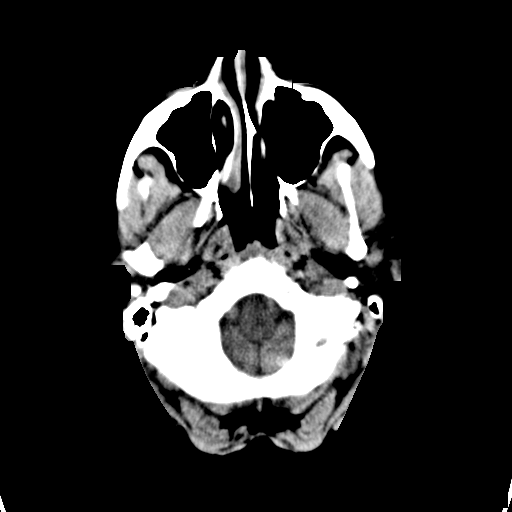
[im 3/31  bone]
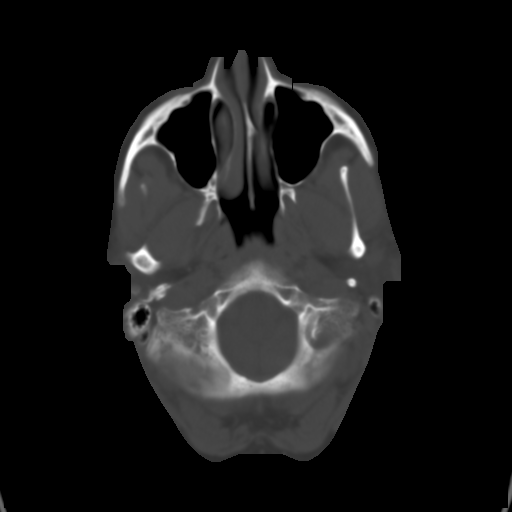
[im 5/31  brain]
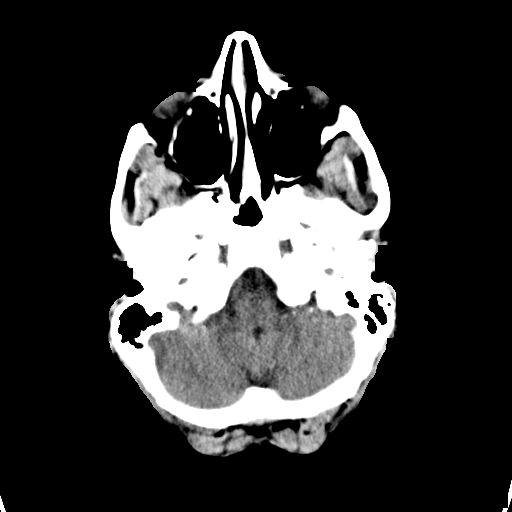
[im 7/31  brain]
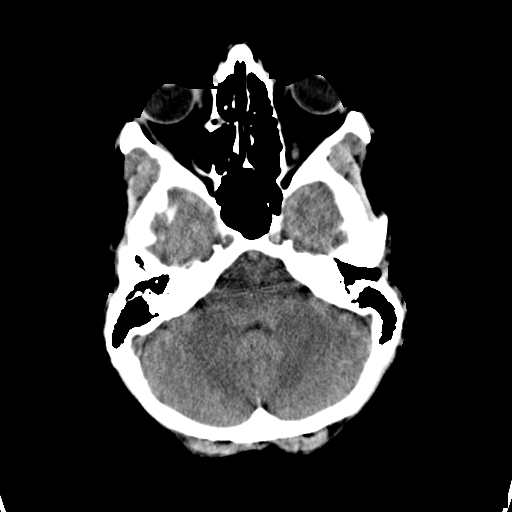
[im 9/31  brain]
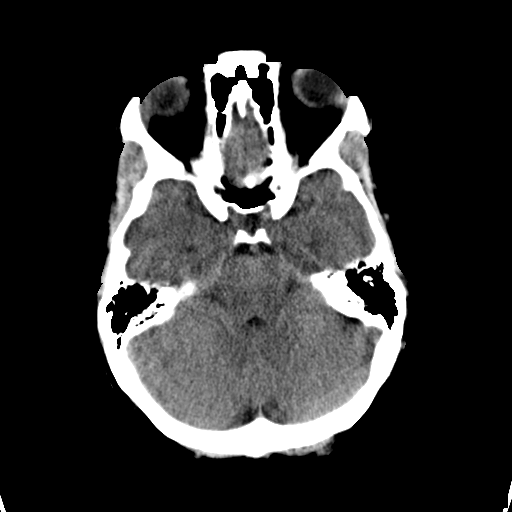
[im 11/31  brain]
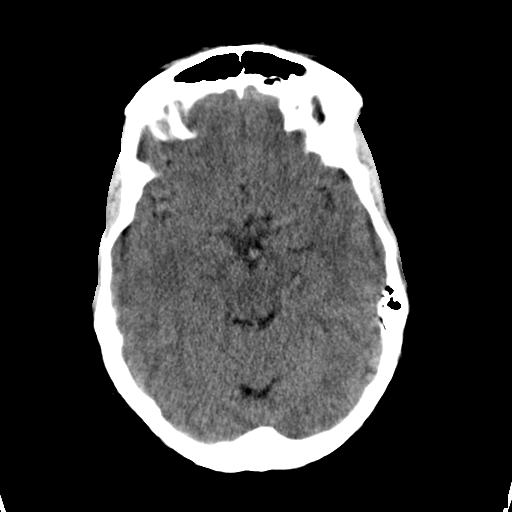
[im 11/31  bone]
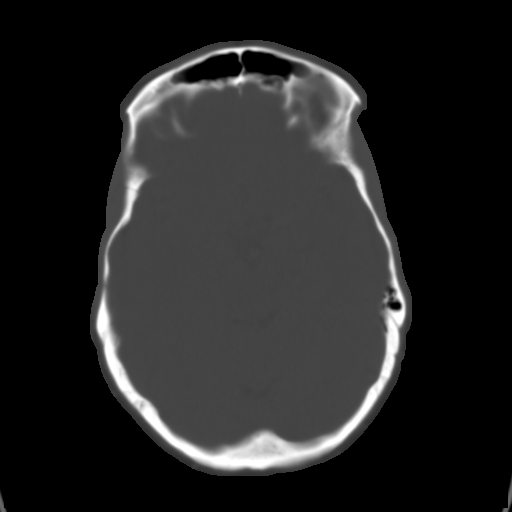
[im 13/31  brain]
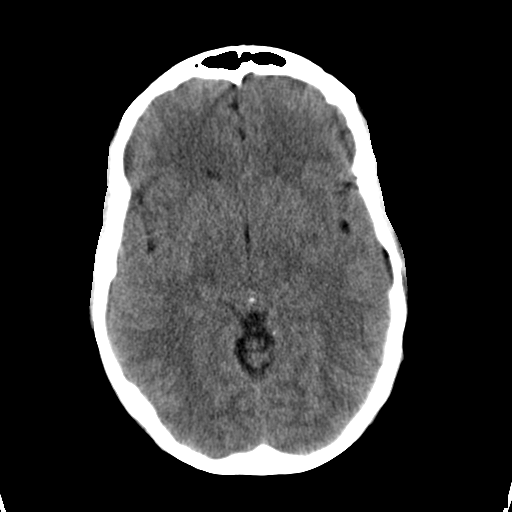
[im 16/31  brain]
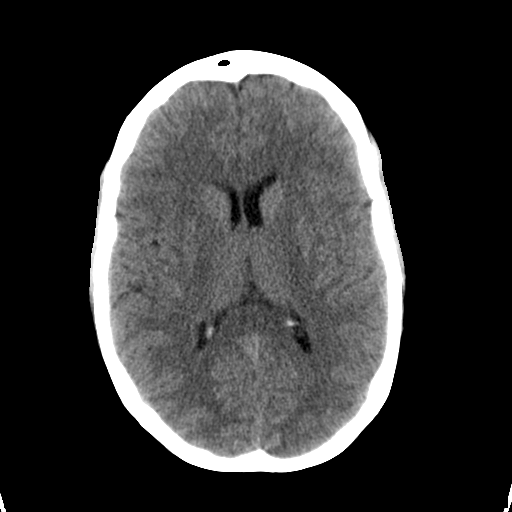
[im 18/31  brain]
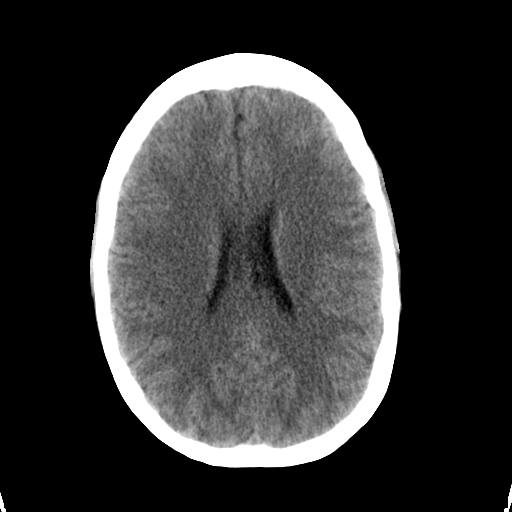
[im 20/31  brain]
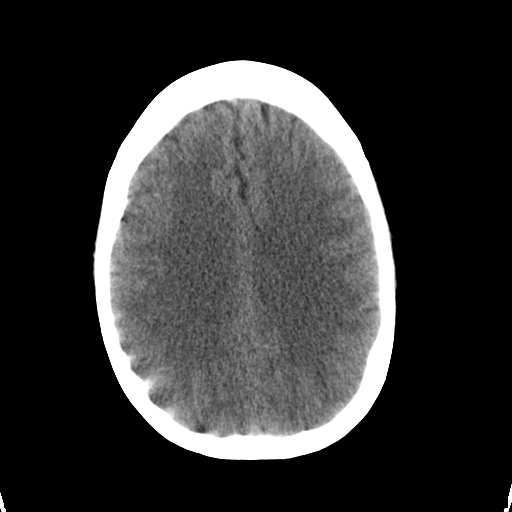
[im 20/31  bone]
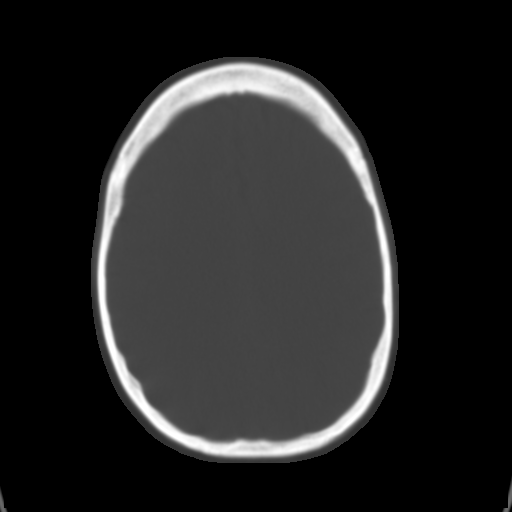
[im 22/31  brain]
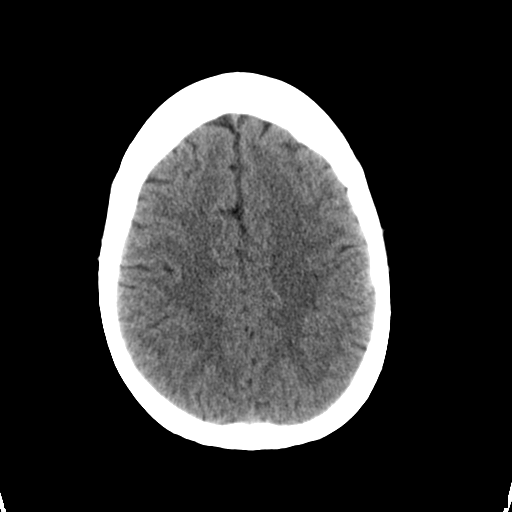
[im 24/31  brain]
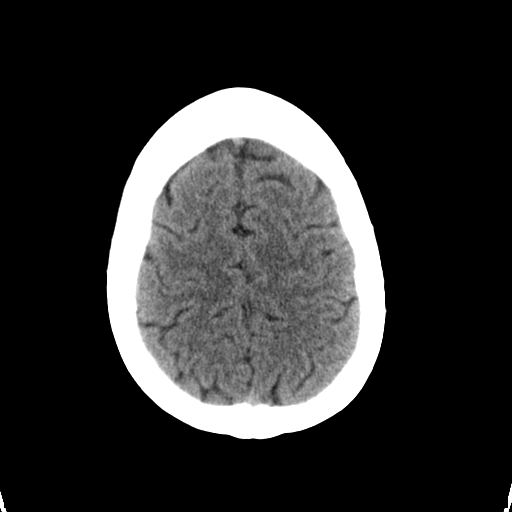
[im 26/31  brain]
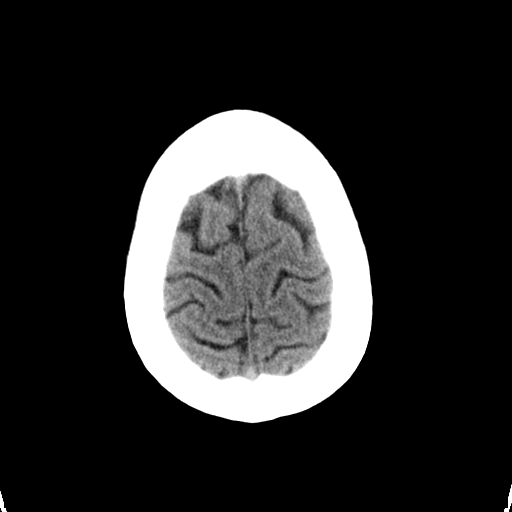
[im 28/31  brain]
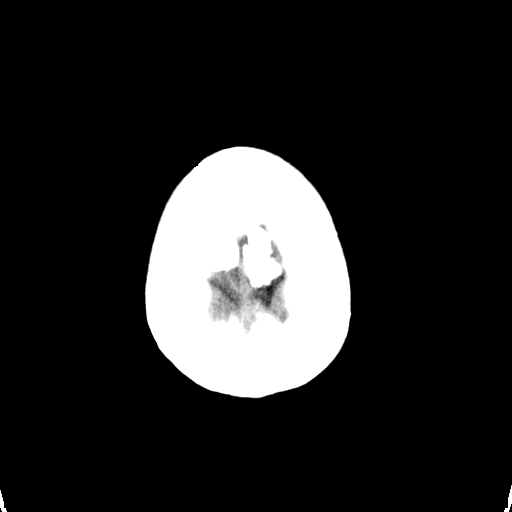
[im 28/31  bone]
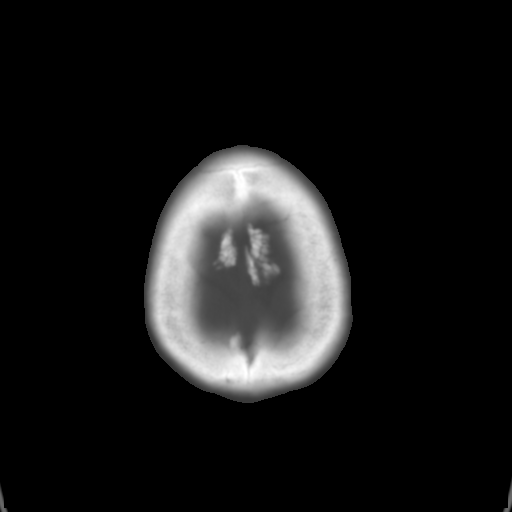

[Series 3: bone · axial · 0.42mm/px · z∈[+885,+925]mm · 3 of 31 slices shown]
[im 3/31  bone]
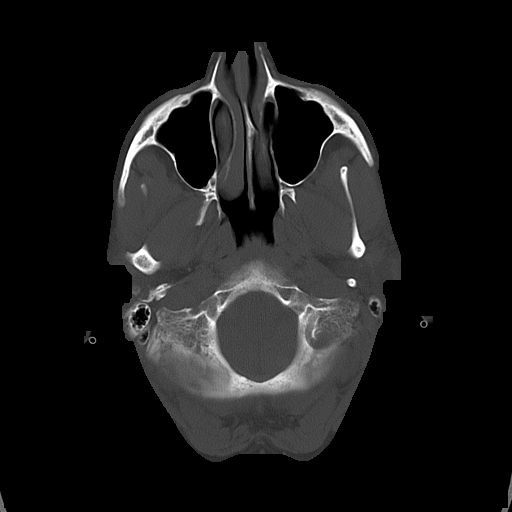
[im 7/31  bone]
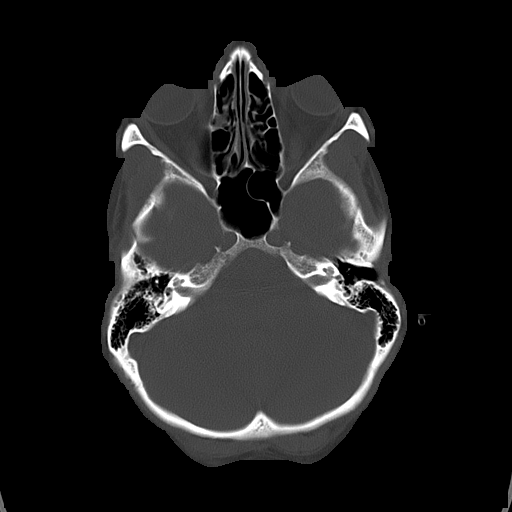
[im 11/31  bone]
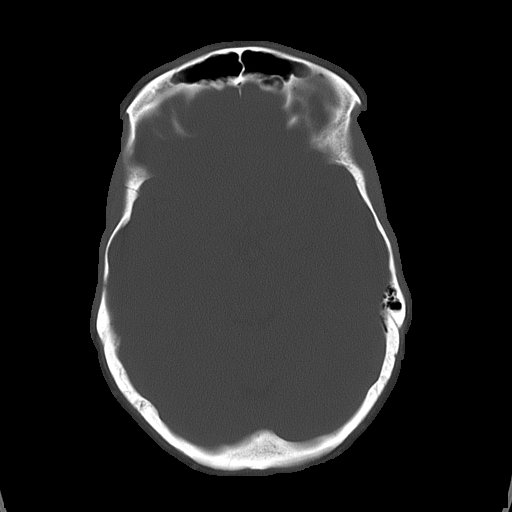

[16 of 30 positions shown; findings below may reference images not displayed]

FINDINGS: There is not evidence of intra-axial fluid collections. There is
no evidence of acute hemorrhage or secondary signs reflecting mass effect or
subacute or chronic focal territorial infarction. The osseous structures
demonstrate no evidence of a depressed skull fracture. If there is
persistent concern clinical follow-up with MRI is recommended.
IMPRESSION: 1. No evidence of acute intracranial abnormalitites.

## 2008-12-22 IMAGING — CR DG CHEST 1V PORT
1 series · 1 of 1 positions shown · non-contrast
Comparison: none

REASON FOR EXAM: cp & syncope
COMMENTS:

PROCEDURE:     DXR - DXR PORTABLE CHEST SINGLE VIEW  - [DATE]  [DATE]
RESULT:     The lungs are clear. The cardiac silhouette and visualized bony
skeleton are unremarkable.

[view not recorded]
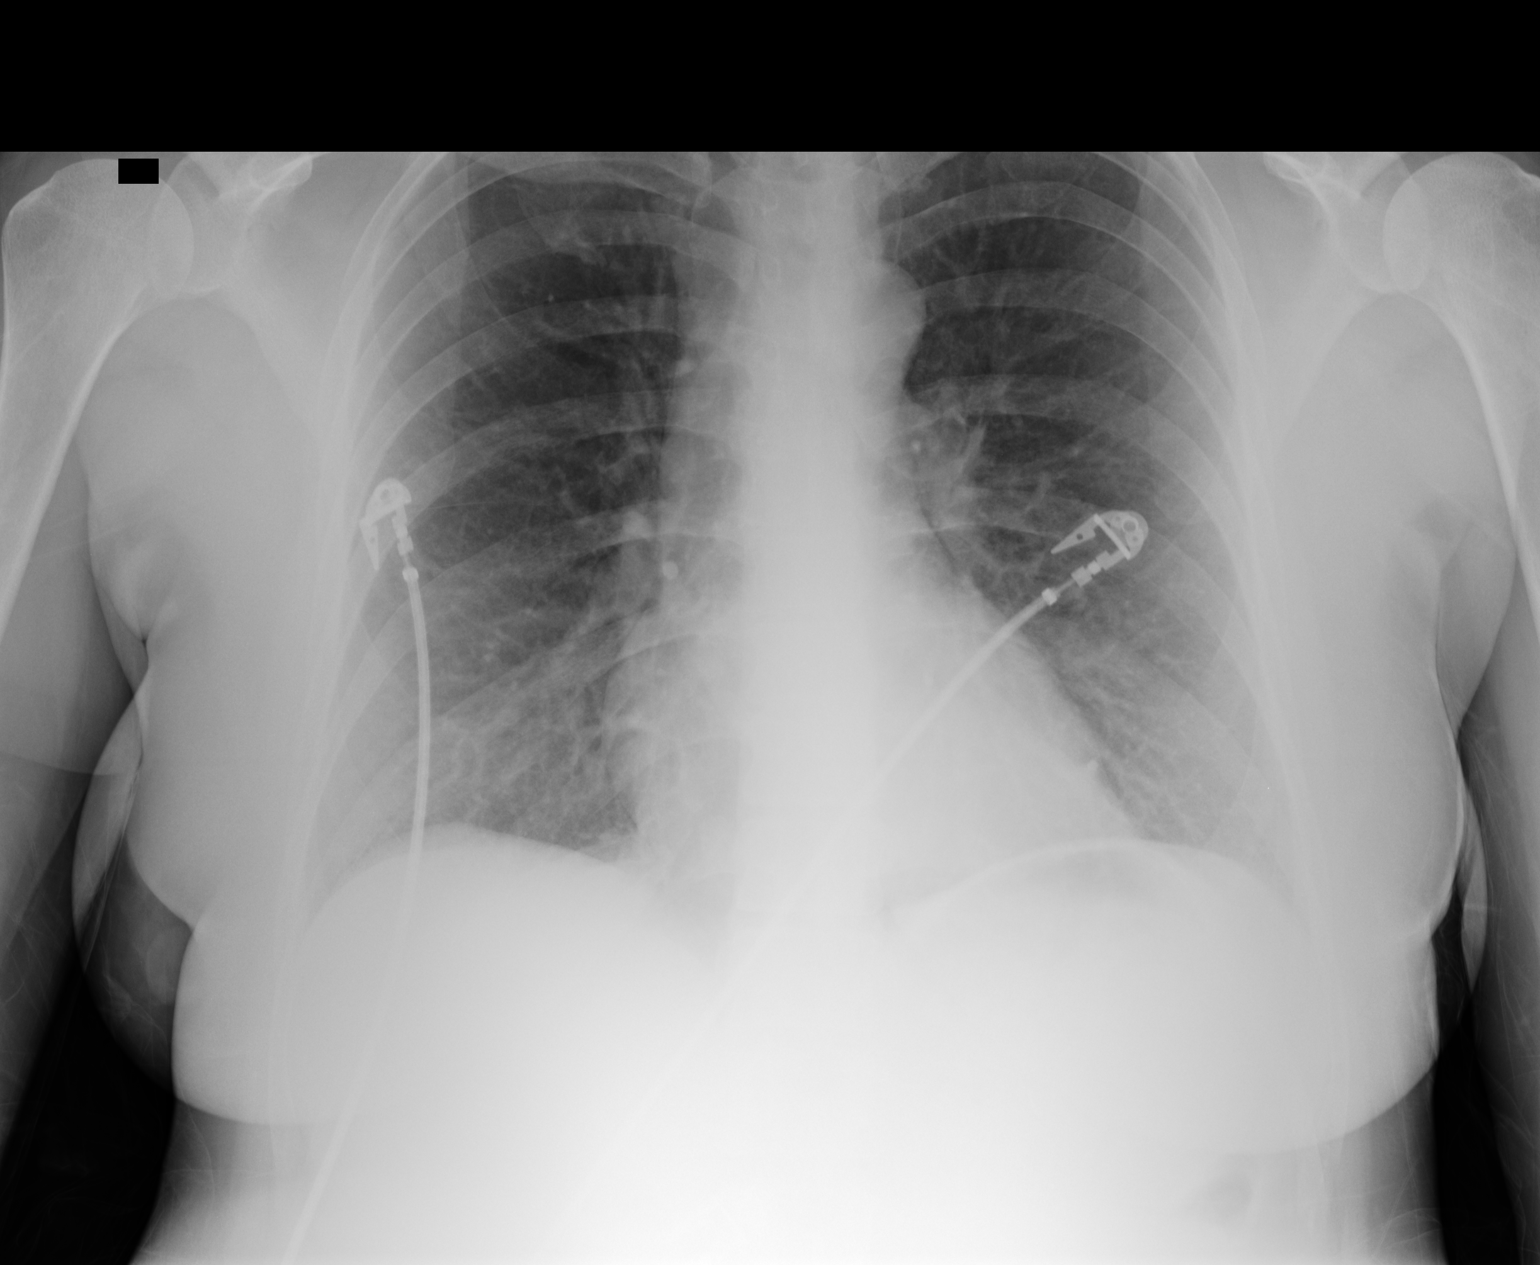

[1 of 1 positions shown; findings below may reference images not displayed]

IMPRESSION: 1. Chest radiograph without evidence of acute cardiopulmonary disease.

## 2008-12-23 ENCOUNTER — Encounter: Payer: Self-pay | Admitting: Internal Medicine

## 2008-12-25 ENCOUNTER — Telehealth (INDEPENDENT_AMBULATORY_CARE_PROVIDER_SITE_OTHER): Payer: Self-pay

## 2008-12-25 DIAGNOSIS — R55 Syncope and collapse: Secondary | ICD-10-CM | POA: Insufficient documentation

## 2008-12-25 HISTORY — DX: Syncope and collapse: R55

## 2008-12-31 ENCOUNTER — Ambulatory Visit: Payer: Self-pay | Admitting: Internal Medicine

## 2008-12-31 ENCOUNTER — Encounter (HOSPITAL_COMMUNITY): Admission: RE | Admit: 2008-12-31 | Discharge: 2009-03-15 | Payer: Self-pay | Admitting: Internal Medicine

## 2008-12-31 ENCOUNTER — Ambulatory Visit: Payer: Self-pay

## 2009-01-01 ENCOUNTER — Encounter: Payer: Self-pay | Admitting: Internal Medicine

## 2009-04-02 ENCOUNTER — Other Ambulatory Visit: Admission: RE | Admit: 2009-04-02 | Discharge: 2009-04-02 | Payer: Self-pay | Admitting: Obstetrics and Gynecology

## 2009-04-02 ENCOUNTER — Ambulatory Visit: Payer: Self-pay | Admitting: Obstetrics and Gynecology

## 2009-04-29 ENCOUNTER — Ambulatory Visit (HOSPITAL_COMMUNITY): Admission: RE | Admit: 2009-04-29 | Discharge: 2009-04-29 | Payer: Self-pay | Admitting: Internal Medicine

## 2009-04-29 IMAGING — US US ABDOMEN COMPLETE
1 series · 14 of 25 positions shown · non-contrast
Comparison: Pelvis radiograph [DATE].

CLINICAL DATA: 48-year-old female with abdominal pain.  History of
hysterectomy.

COMPLETE ABDOMINAL ULTRASOUND

[Series 1: us abdomen complete · 0.24mm/px · 14 of 76 slices shown]
[im 1/76]
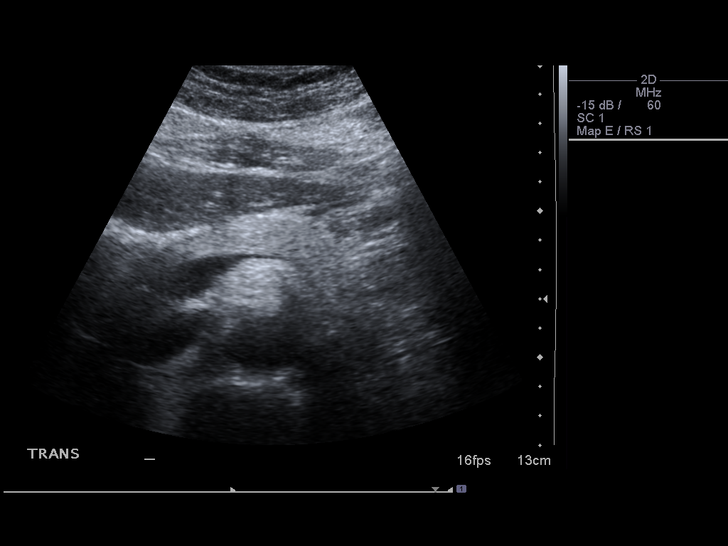
[im 7/76]
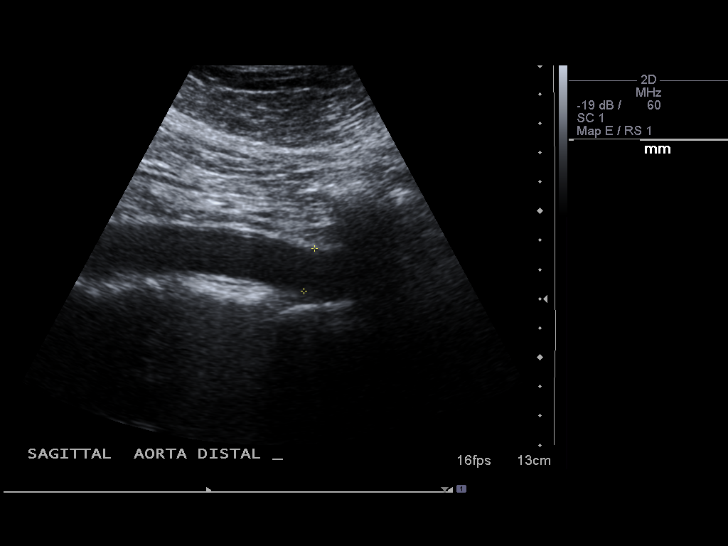
[im 13/76]
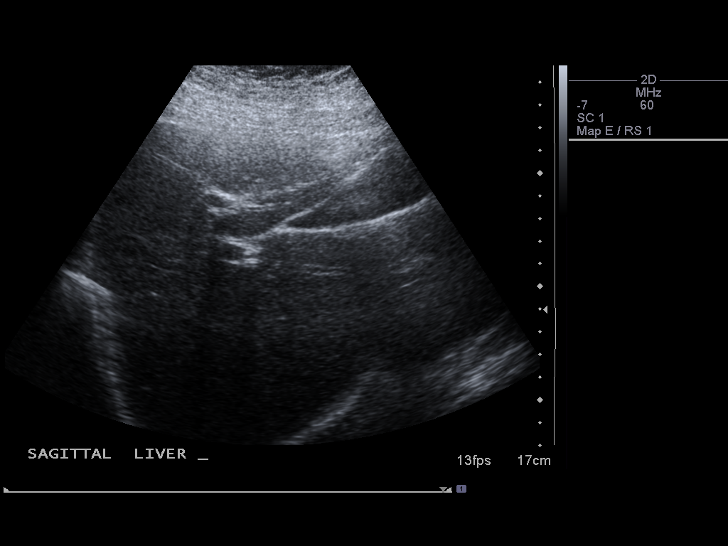
[im 19/76]
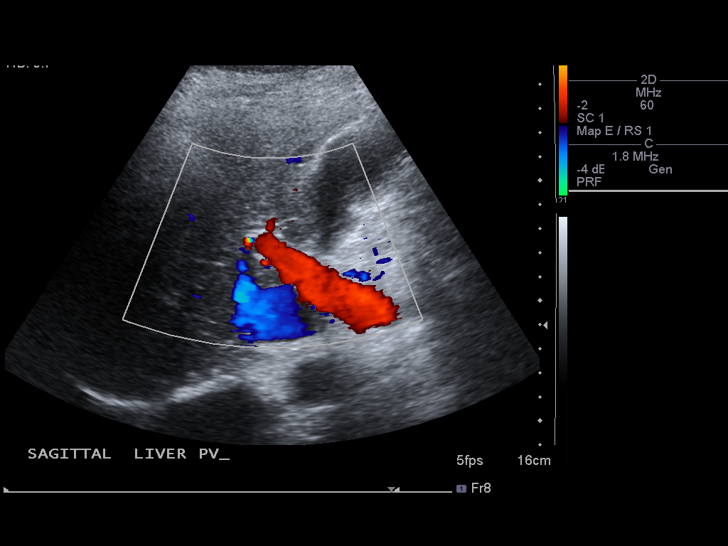
[im 26/76]
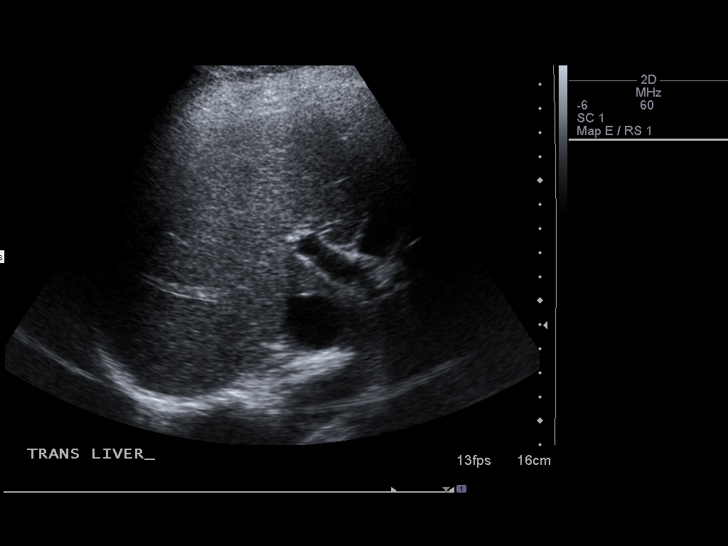
[im 29/76]
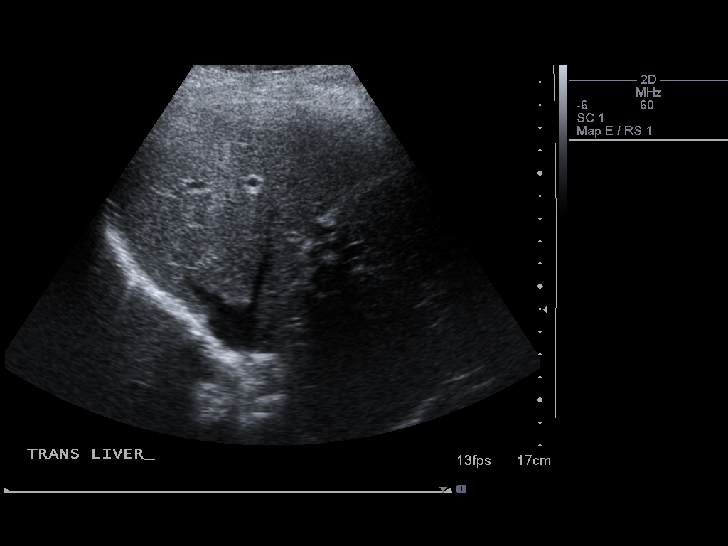
[im 35/76]
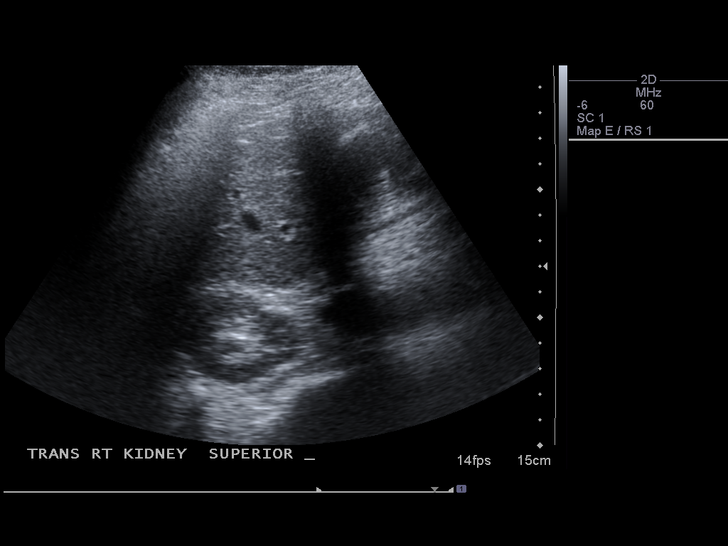
[im 41/76]
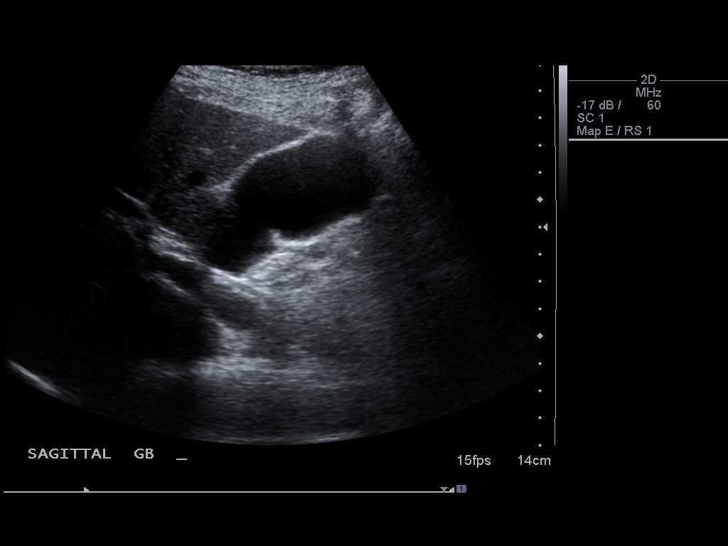
[im 47/76]
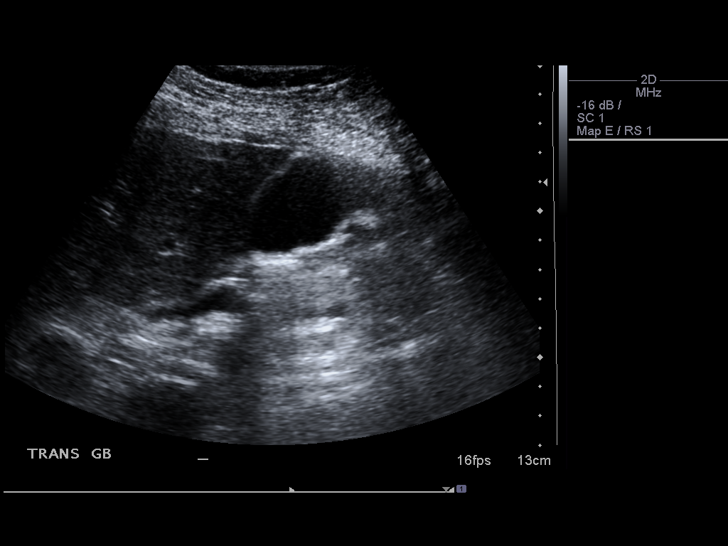
[im 51/76]
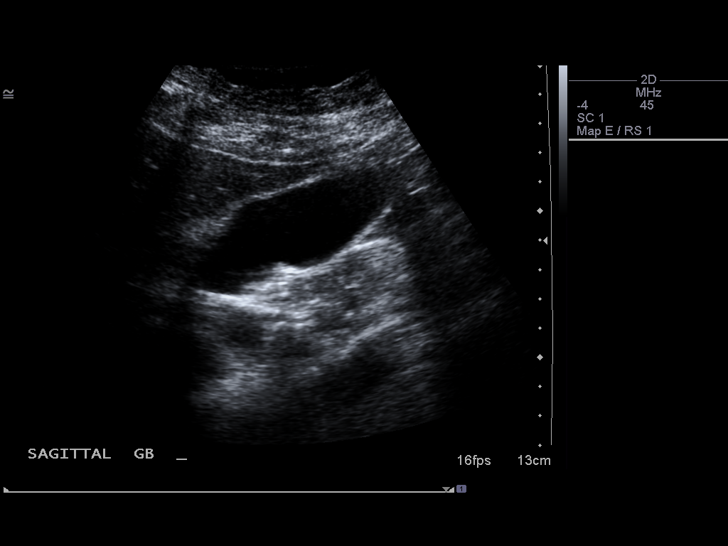
[im 57/76]
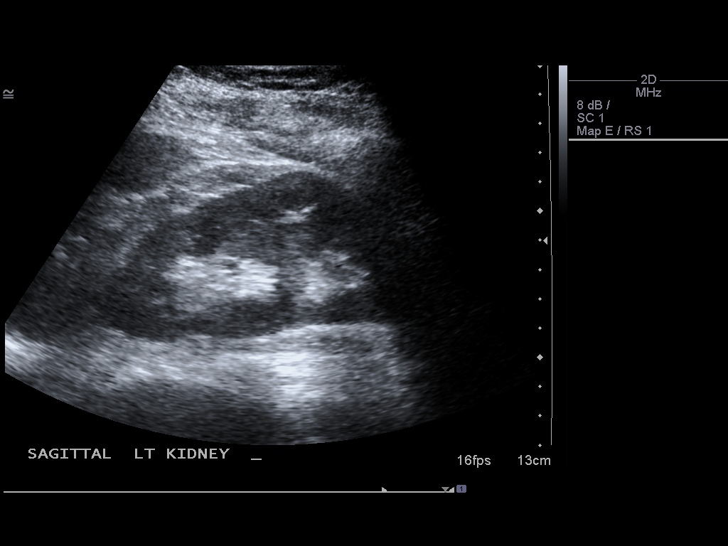
[im 63/76]
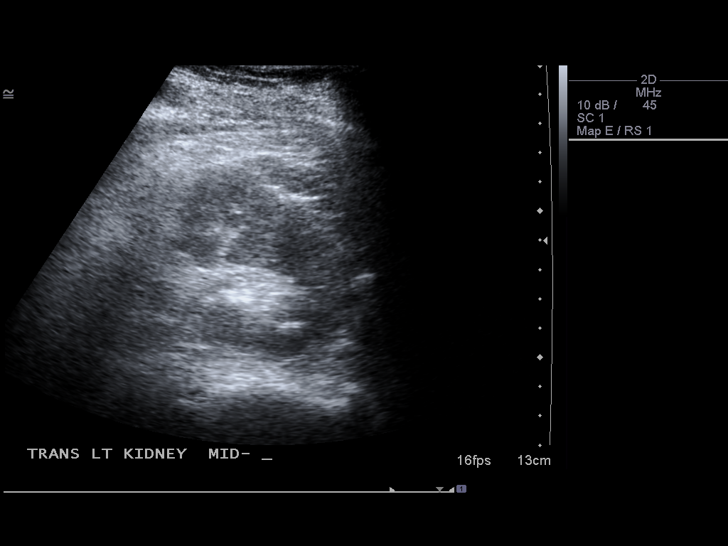
[im 69/76]
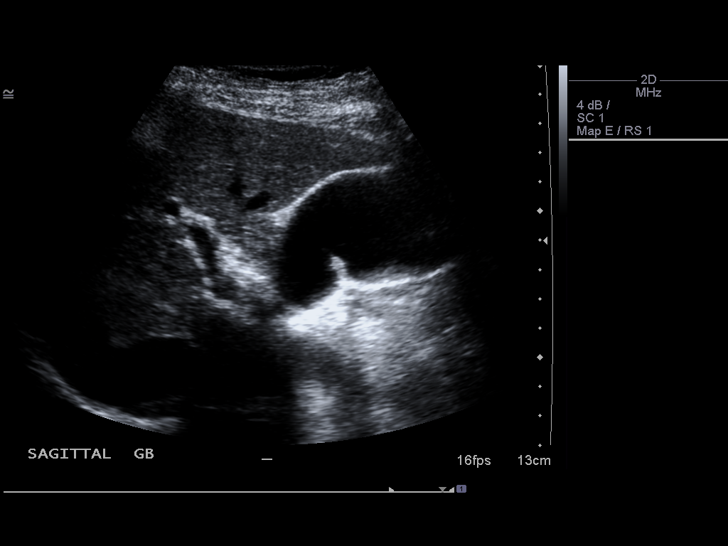
[im 76/76]
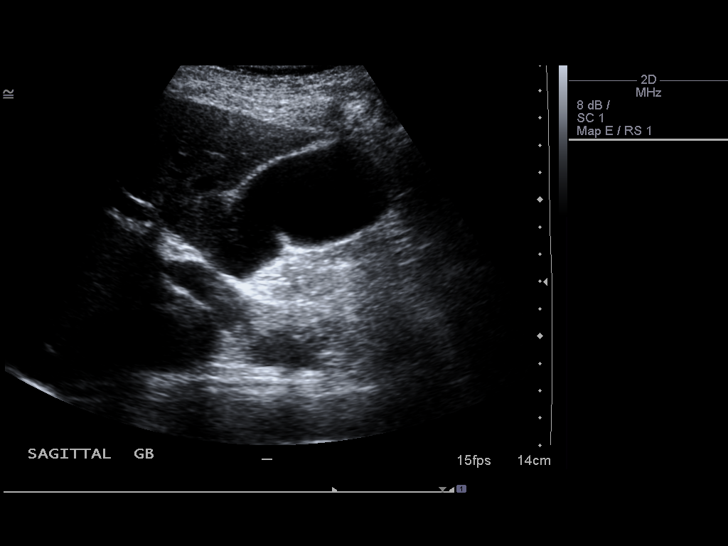

[14 of 25 positions shown; findings below may reference images not displayed]

FINDINGS: Gallbladder:  No gallstones, gallbladder wall thickening, or
pericholecystic fluid. No sonographic Murphy's sign.

Common bile duct:  Negative measuring 4-5 mm.

Liver:  No focal lesion identified.  Parenchymal echogenicity
within normal limits.

IVC:  Appears normal.

Pancreas:  No focal abnormality seen.

Spleen:  Normal measuring 7.1 cm in length.

Right Kidney:  Normal measuring 11.4 cm in length.

Left Kidney:  Normal measuring 11.2 cm in length.

Abdominal aorta:  No aneurysm identified.
IMPRESSION: Negative abdominal ultrasound. Normal gallbladder.

## 2009-05-14 ENCOUNTER — Encounter: Admission: RE | Admit: 2009-05-14 | Discharge: 2009-05-14 | Payer: Self-pay | Admitting: Obstetrics and Gynecology

## 2009-05-14 IMAGING — MG MM DIGITAL SCREENING BILAT W/ CAD
4 series · 4 of 4 positions shown · non-contrast
Comparison: none

DG SCREEN MAMMOGRAM BILATERAL
Bilateral CC and MLO view(s) were taken.

DIGITAL SCREENING MAMMOGRAM WITH CAD:
The breast tissue is heterogeneously dense.  No masses or malignant type calcifications are 
identified.  Compared with prior studies.
Images were processed with CAD.

[R CC]
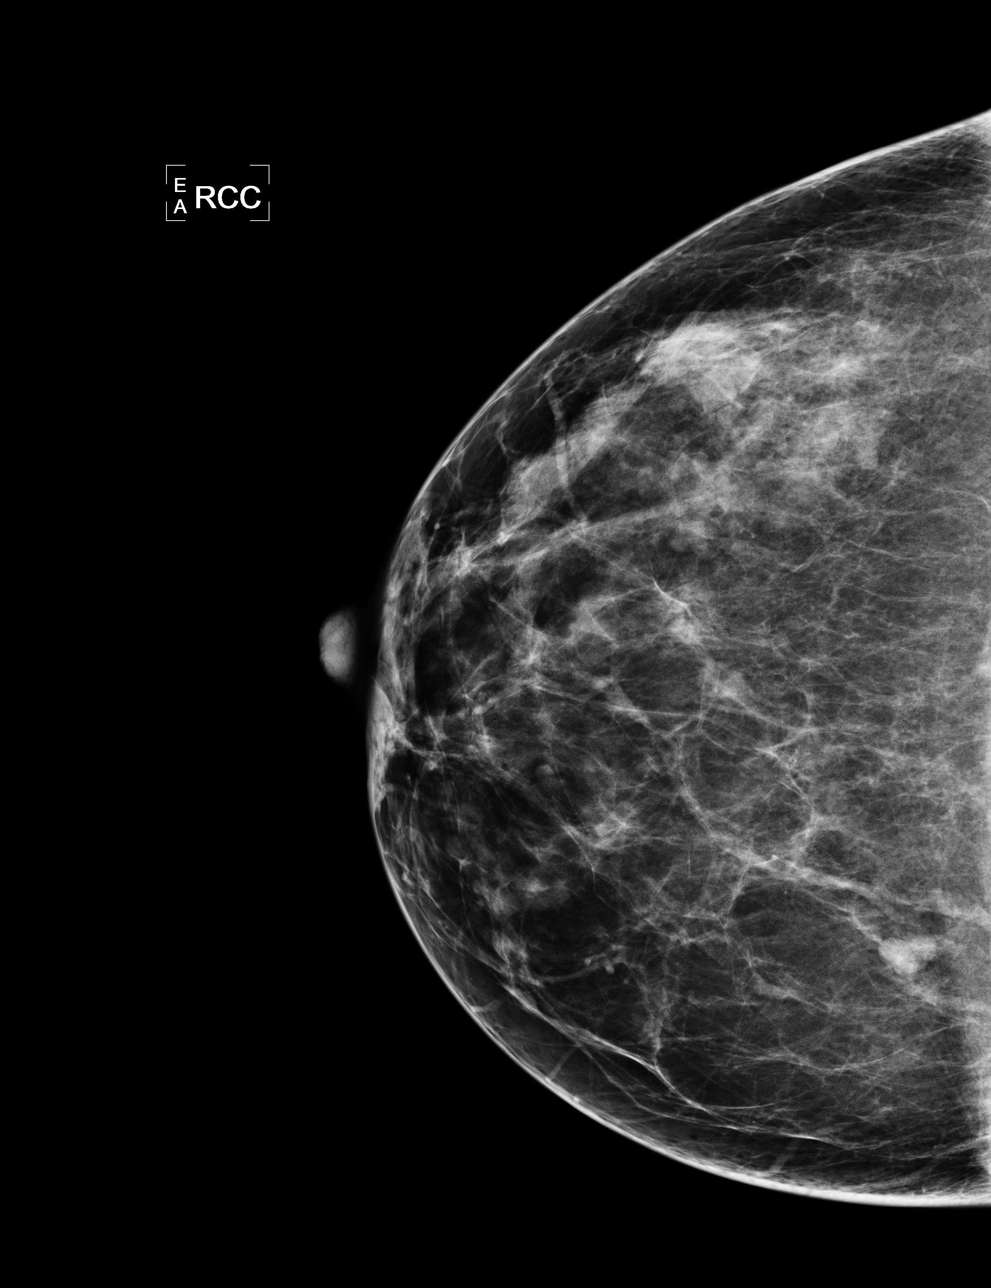

[L CC]
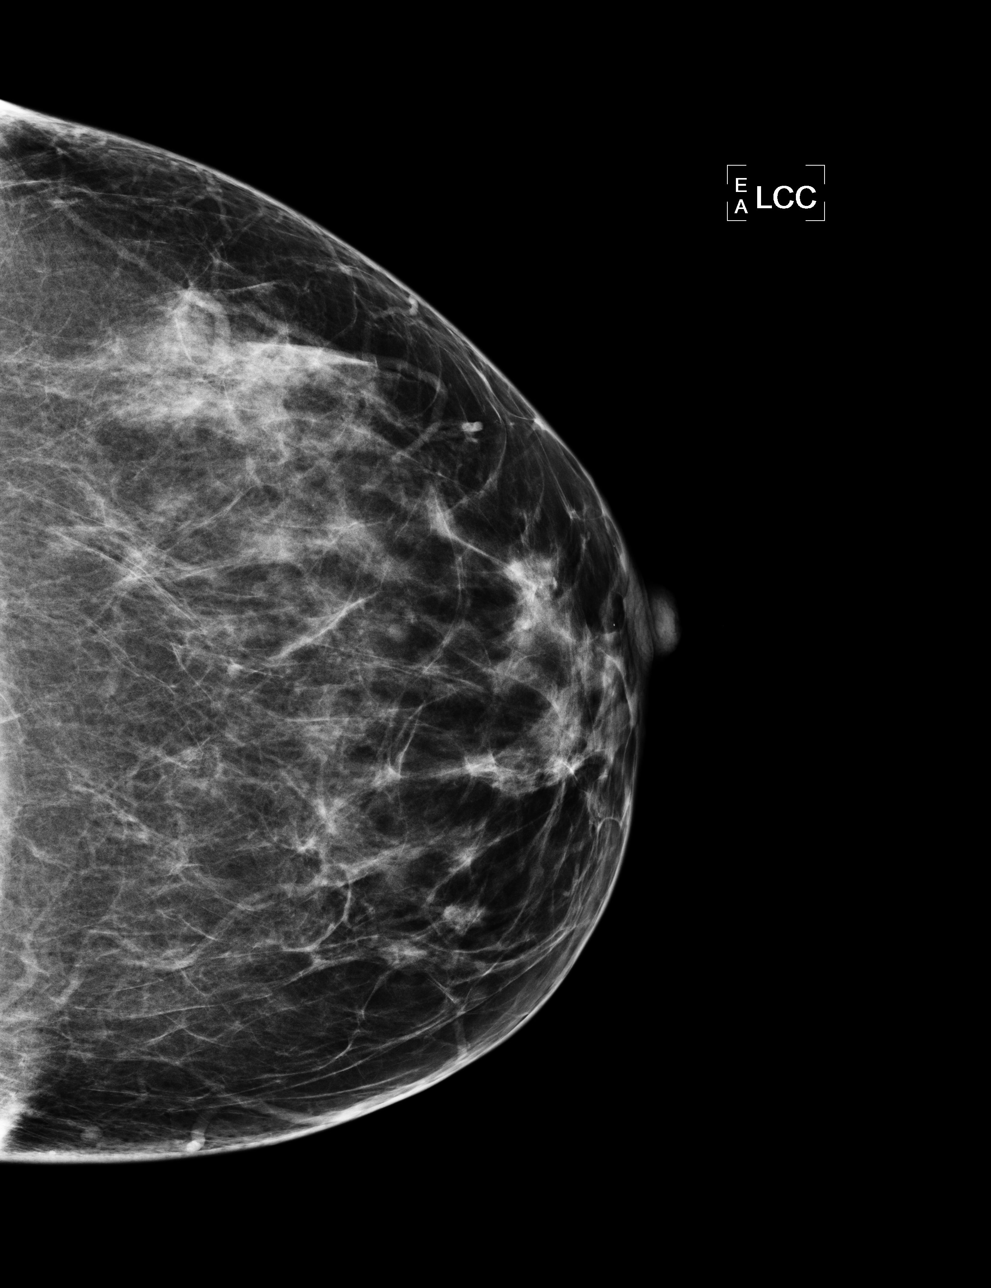

[L MLO]
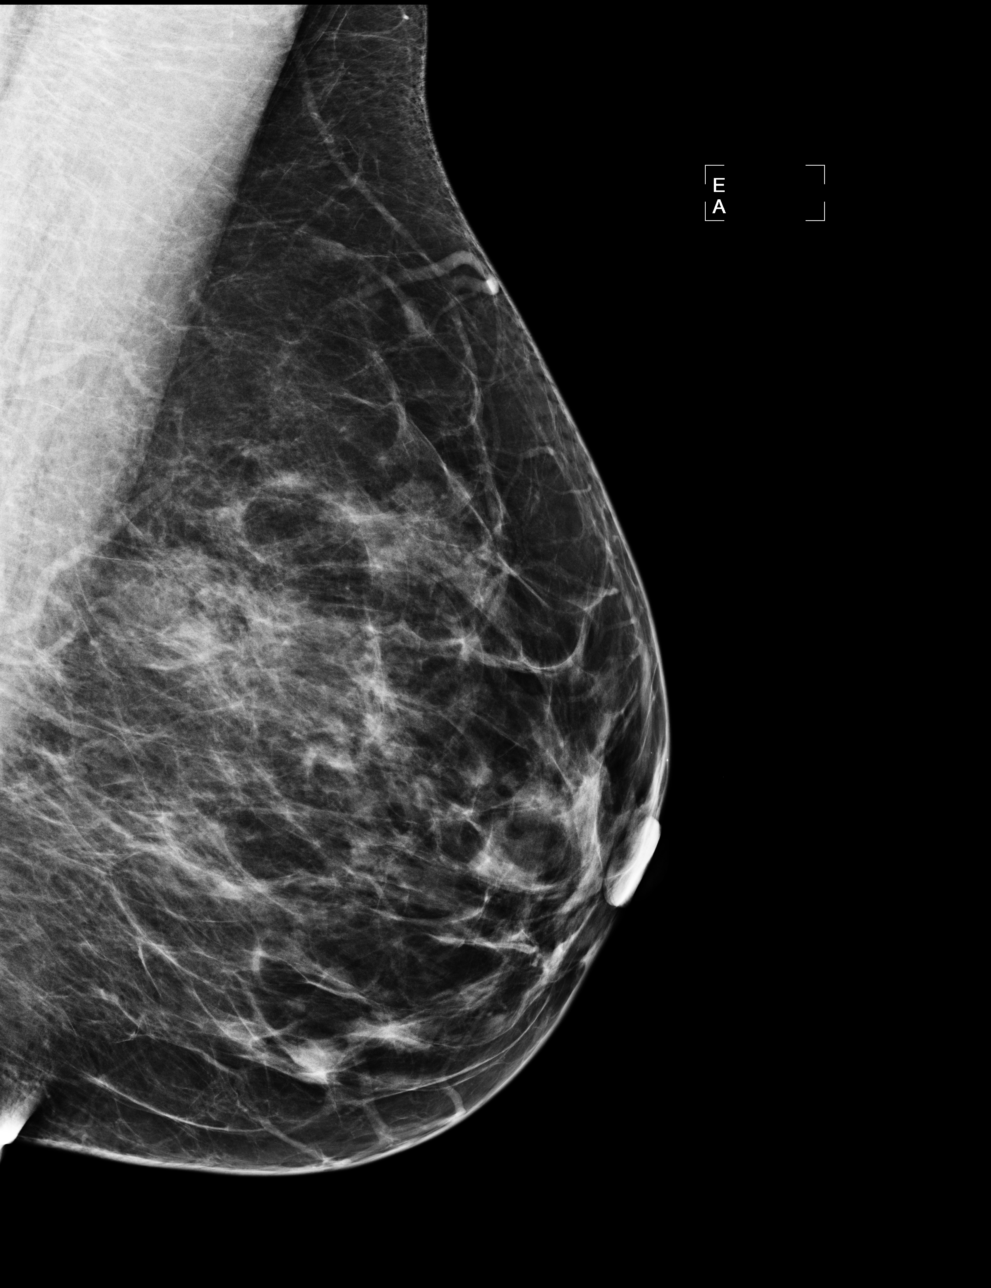

[R MLO]
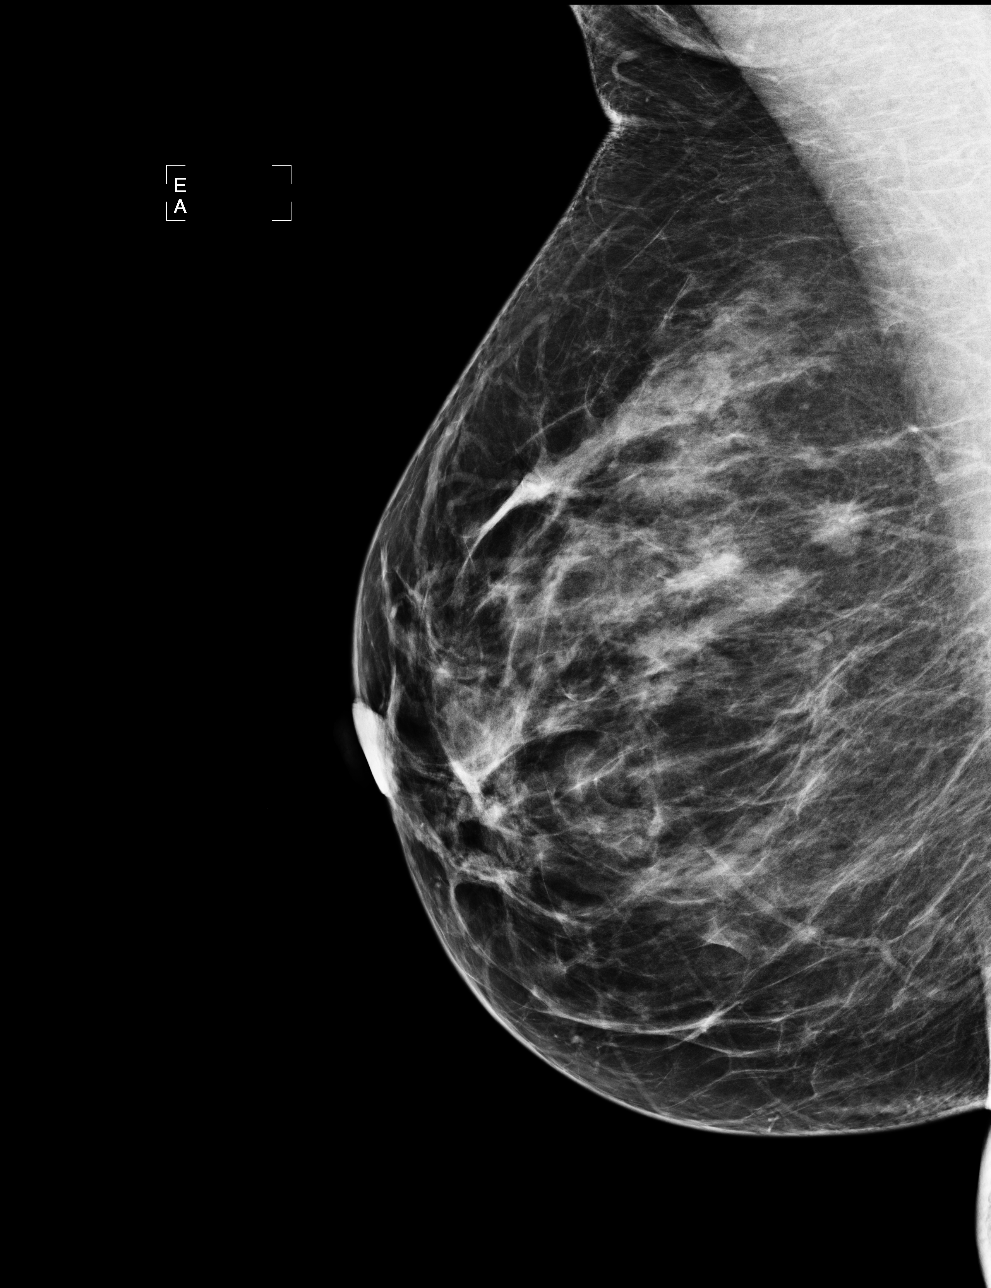

[4 of 4 positions shown; findings below may reference images not displayed]

IMPRESSION: No specific mammographic evidence of malignancy.  Next screening mammogram is recommended in one 
year.

A result letter of this screening mammogram will be mailed directly to the patient.

ASSESSMENT: Negative - BI-RADS 1

Screening mammogram in 1 year.
,

## 2010-04-05 ENCOUNTER — Encounter: Payer: Self-pay | Admitting: Internal Medicine

## 2010-04-07 ENCOUNTER — Encounter: Payer: Self-pay | Admitting: Obstetrics and Gynecology

## 2010-04-30 ENCOUNTER — Other Ambulatory Visit: Payer: Self-pay | Admitting: Internal Medicine

## 2010-04-30 DIAGNOSIS — E041 Nontoxic single thyroid nodule: Secondary | ICD-10-CM

## 2010-05-02 ENCOUNTER — Other Ambulatory Visit: Payer: Self-pay

## 2010-05-05 ENCOUNTER — Ambulatory Visit
Admission: RE | Admit: 2010-05-05 | Discharge: 2010-05-05 | Disposition: A | Discharge status: Home / Self Care | Payer: 59 | Source: Ambulatory Visit | Attending: Internal Medicine | Admitting: Internal Medicine

## 2010-05-05 DIAGNOSIS — E041 Nontoxic single thyroid nodule: Secondary | ICD-10-CM

## 2010-05-05 IMAGING — US US SOFT TISSUE HEAD/NECK
1 series · 14 of 16 positions shown · non-contrast
Comparison: CT [DATE], thyroid ultrasound [DATE]
(preoperative)

CLINICAL DATA: Hypothyroidism, thyroidectomy 6 years ago.  Biopsy
[R9].

THYROID ULTRASOUND
TECHNIQUE: Ultrasound examination of the thyroid gland and adjacent
soft tissues was performed.

[Series 1: us soft tissue head/neck · 0.06mm/px · 14 of 16 slices shown]
[im 1/16]
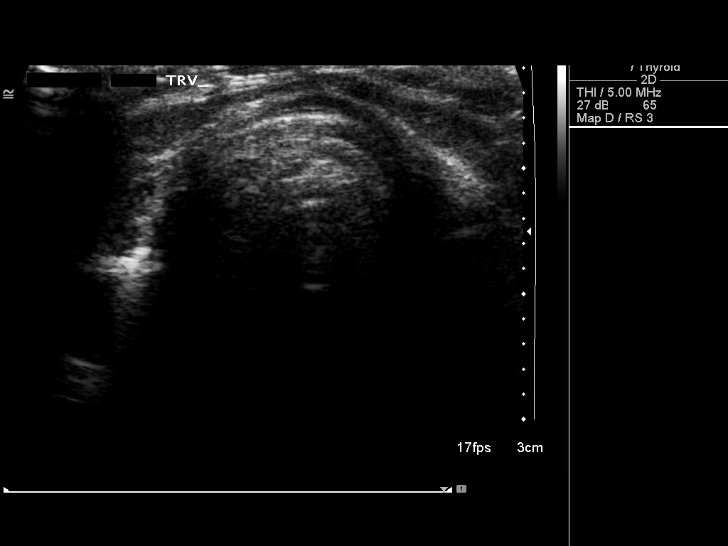
[im 2/16]
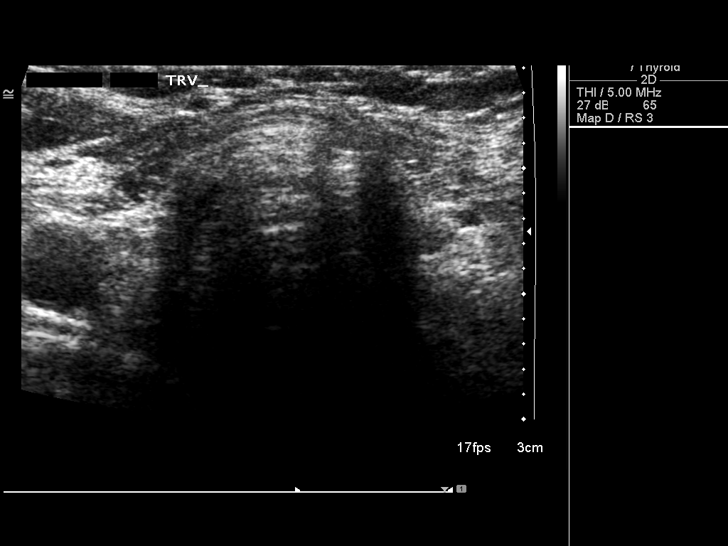
[im 3/16]
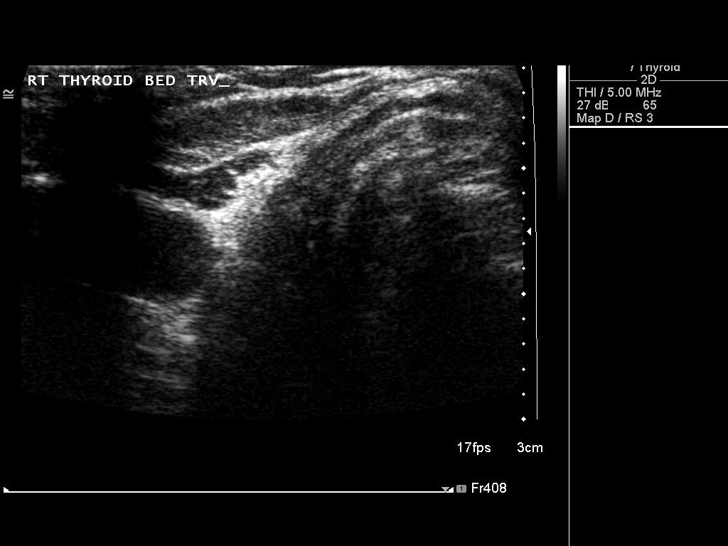
[im 5/16]
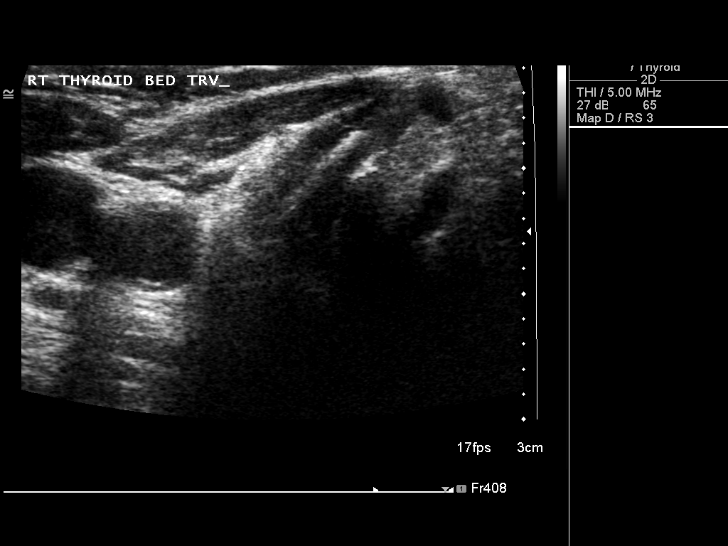
[im 6/16]
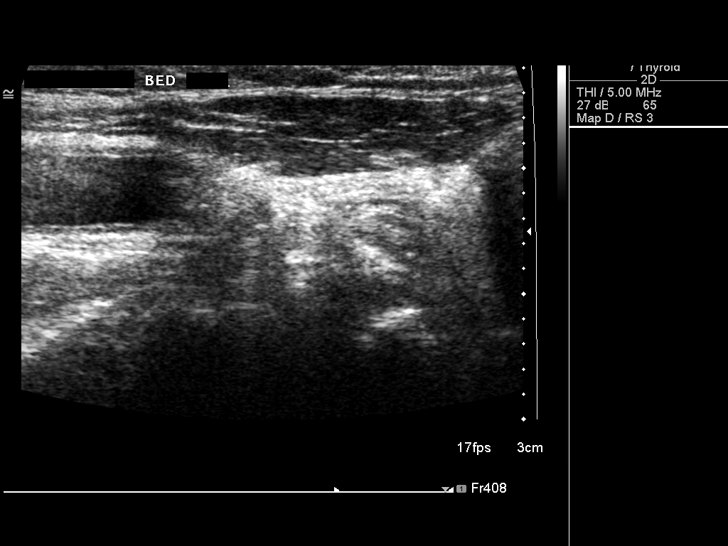
[im 7/16]
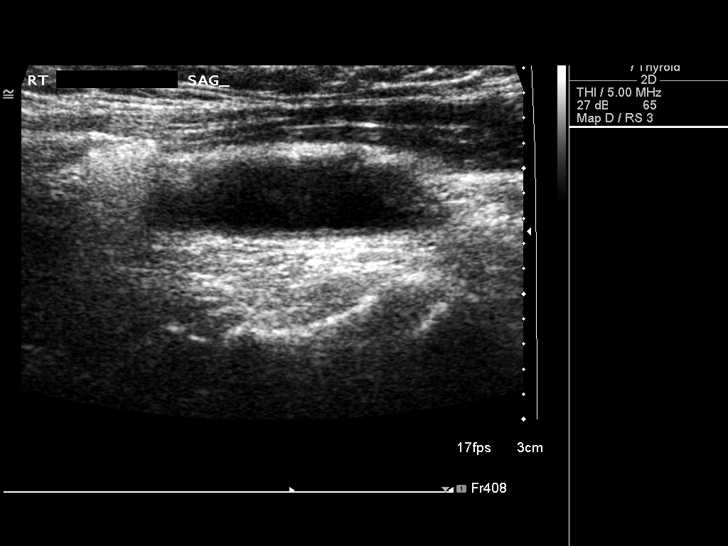
[im 8/16]
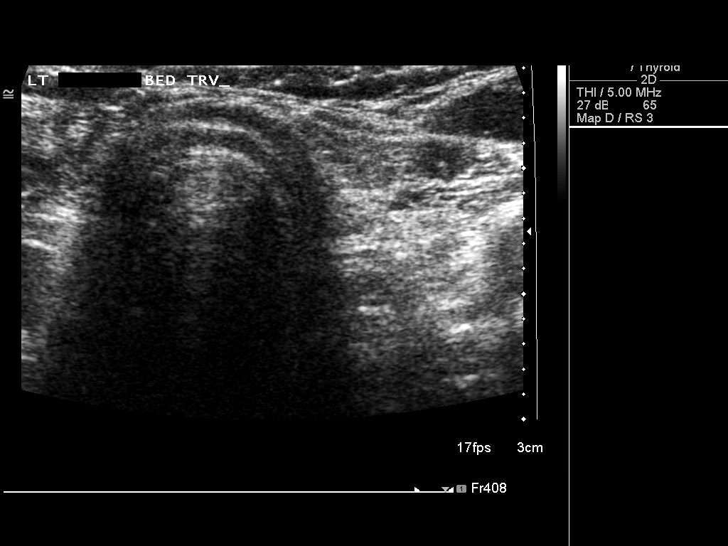
[im 9/16]
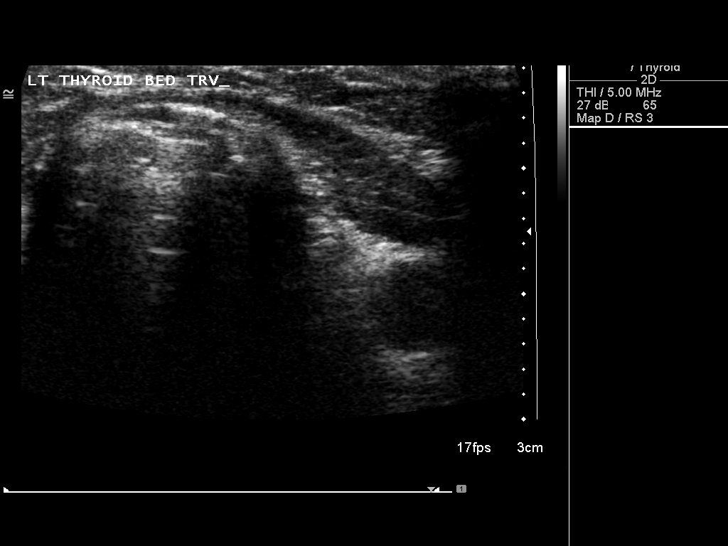
[im 10/16]
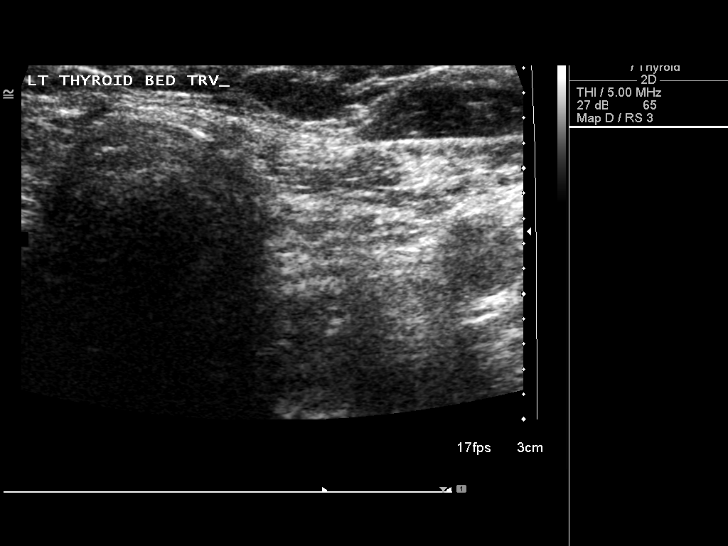
[im 11/16]
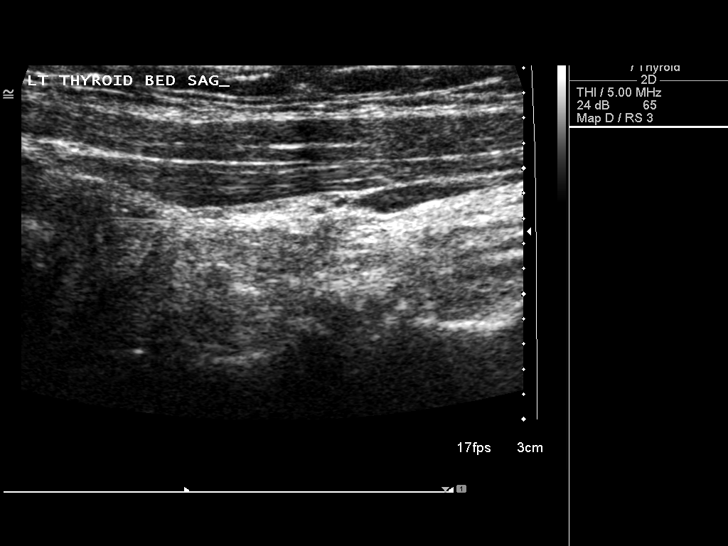
[im 13/16]
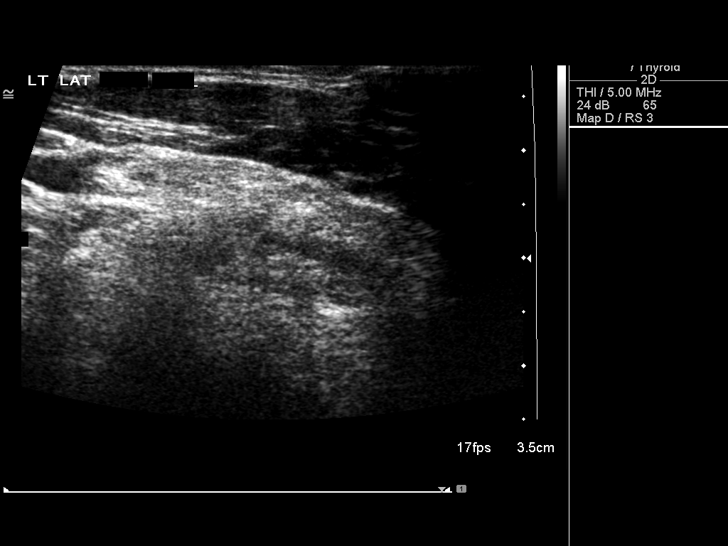
[im 14/16]
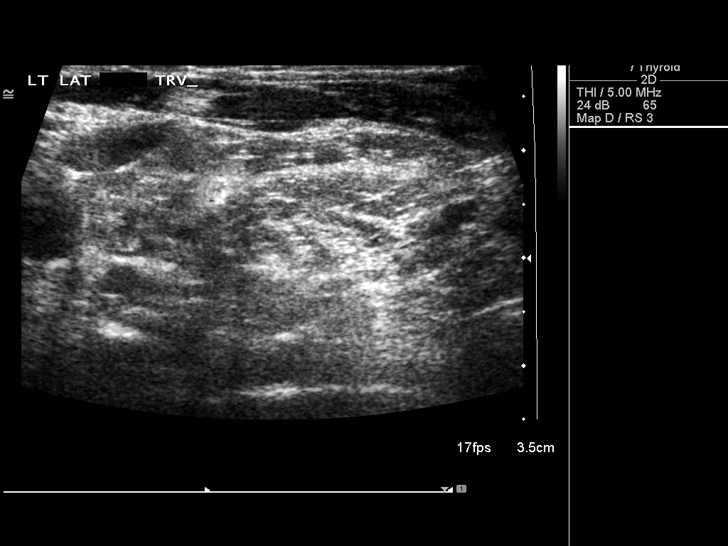
[im 15/16]
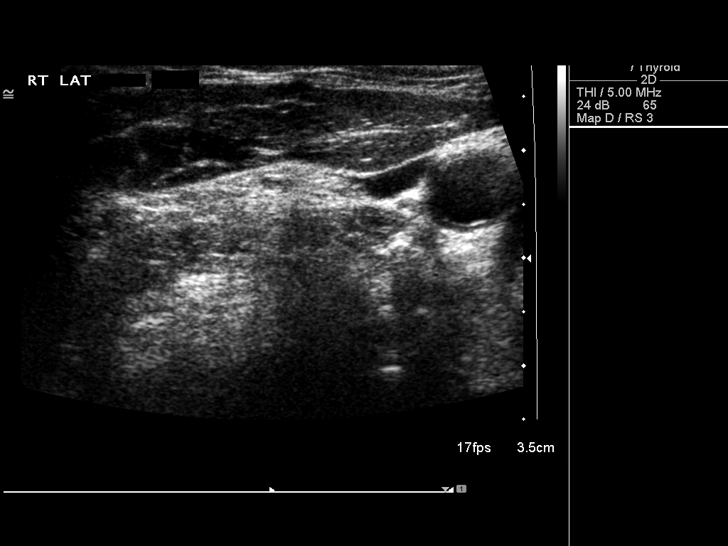
[im 16/16]
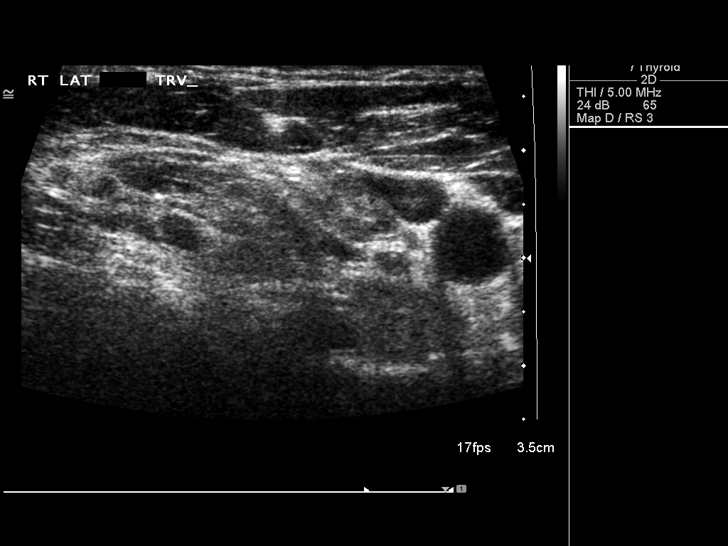

[14 of 16 positions shown; findings below may reference images not displayed]

FINDINGS: Right thyroid lobe:  Surgically absent.  No mass identified.
Left thyroid lobe:  Surgically absent.  No mass identified.
Isthmus:  Surgically absent.  No mass identified.

Focal nodules:  None

Lymphadenopathy:  None visualized.
IMPRESSION: Expected postoperative appearance after thyroidectomy without mass
identified or lymphadenopathy in the visualized thyroid bed.

## 2010-08-01 NOTE — Op Note (Signed)
NAME:  Bethany [name], Bethany [name]                         ACCOUNT NO.:  [id number]   MEDICAL RECORD NO.:  [id number]                   PATIENT TYPE:  OBV   LOCATION:  0440                                 FACILITY:  [facility]   PHYSICIAN:  Velora Heckler, M.D.                DATE OF BIRTH:  07-Feb-1961   DATE OF PROCEDURE:  04/02/2003  DATE OF DISCHARGE:                                 OPERATIVE REPORT   PREOPERATIVE DIAGNOSIS:  Multinodular goiter with compressive symptoms.   POSTOPERATIVE DIAGNOSIS:  Multinodular goiter with compressive symptoms.   PROCEDURE:  Total thyroidectomy.   SURGEON:  Velora Heckler, M.D.   ASSISTANT:  Gita Kudo, M.D.   ANESTHESIA:  General.   ESTIMATED BLOOD LOSS:  Minimal.   PREPARATION:  Betadine.   COMPLICATIONS:  None.   INDICATIONS:  The patient is a 50 year old white female seen at the request  of Sean A. Everardo All, M.D., at [facility] for thyroid goiter with  follicular lesion.  The patient has a known history of goiter for  approximately seven to eight years.  She has developed mild enlargement of  the gland over the past one to two years.  She has developed mild dysphagia.  Ultrasound showed multiple nodules.  A dominant nodule in the right lobe  underwent fine needle aspiration in November 2004.  This showed a follicular  lesion, and a low-grade follicular neoplasm could not be ruled out.  The  patient now comes to surgery for thyroidectomy.   BODY OF REPORT:  The procedure was done in OR #6 at the [facility].  The patient was brought to the operating room and  placed in a supine position on the operating room table.  Following  administration of general anesthesia the patient is prepped and draped in  the usual strict aseptic fashion.  After ascertaining that an adequate level  of anesthesia had been obtained, a Kocher incision is made with a #15 blade.  Dissection was carried down through the skin and subcutaneous  tissues and  platysma.  Hemostasis is obtained with the electrocautery.  Skin flaps are  developed cephalad and caudad from the thyroid notch to the sternal notch.  A Mahorner self-retaining retractor is placed for exposure.  Strap muscles  are incised in the midline.  Dissection is begun on the right side.  Strap  muscles are reflected laterally.  Middle thyroid vein is divided between  medium Ligaclips.  Inferior parathyroid gland is identified.  It is situated  well up on the thyroid capsule.  With some difficulty it is dissected away  from the thyroid capsule; however, the vascular pedicle is sacrificed and  the gland is deemed ischemic.  Therefore, the gland is sectioned into  approximately five fragments and reimplanted into the right  sternocleidomastoid muscle.  Muscular incision is closed with 3-0 Vicryl  figure-of-eight suture.  Continuing dissection on  the right thyroid lobe,  the inferior veins are divided between small and medium Ligaclips.  The  superior pole is dissected out.  The superior pole vessels are divided  between doubled medium Ligaclips.  The gland is rolled anteriorly.  The  superior parathyroid gland is identified and preserved.  The recurrent  laryngeal nerve is identified and preserved.  Branches of the inferior  thyroid artery are divided between small Ligaclips.  Ligament of Allyson Sabal is  transected and the gland is rolled medially onto the anterior surface of the  trachea.  Good hemostasis is noted in the right neck.  A dry pack is placed.   Next we turned our attention to the left thyroid lobe.  Again strap muscles  are reflected laterally.  Middle thyroid vein is divided between small  Ligaclips.  Inferior venous tributaries are divided between small to medium  Ligaclips.  The gland is rolled anteriorly.  The superior parathyroid gland  on the left is identified.  It is dissected off the thyroid capsule and  preserved on its vascular pedicle.  Superior pole  vessels are divided  between doubled medium Ligaclips.  The gland is rolled further anteriorly.  Branches of the inferior thyroid artery are divided between small Ligaclips.  Inferior parathyroid gland is identified and preserved.  The gland is rolled  up and onto the anterior trachea and excised off the trachea using the  electrocautery for hemostasis.  A suture is used to mark the right superior  pole.  The gland is submitted for permanent sectioning only.  The neck is  irrigated with warm saline, which is evacuated.  Good hemostasis is noted.  Surgicel is placed over the area of the recurrent laryngeal nerves and  parathyroid glands bilaterally.  Strap muscles are reapproximated in the  midline with interrupted 3-0 Vicryl sutures.  Platysma was closed with  interrupted 3-0 Vicryl sutures.  The skin edges are reapproximated with a  running 4-0 Vicryl subcuticular suture.  The wound is washed and dried and  Benzoin and Steri-Strips are applied.  Sterile gauze dressings are applied.  The patient is awakened from anesthesia and brought to the recovery room in  stable condition.  The patient tolerated the procedure well.                                               Velora Heckler, M.D.    TMG/MEDQ  D:  04/02/2003  T:  04/02/2003  Job:  [id number]   cc:   Gregary Signs A. Everardo All, M.D. [facility]   Titus Dubin. Alwyn Ren, M.D. [facility]

## 2010-08-01 NOTE — Discharge Summary (Signed)
NAME:  Bethany [name], Bethany [name]                         ACCOUNT NO.:  [id number]   MEDICAL RECORD NO.:  [id number]                   PATIENT TYPE:  INP   LOCATION:  0440                                 FACILITY:  [facility]   PHYSICIAN:  Velora Heckler, M.D.                DATE OF BIRTH:  Apr 04, 1960   DATE OF ADMISSION:  04/02/2003  DATE OF DISCHARGE:  04/08/2003                                 DISCHARGE SUMMARY   REASON FOR ADMISSION:  Multinodular goiter.   BRIEF HISTORY:  The patient is a 50 year old white female seen at the  request of Dr. Gregary Signs A. Everardo All for thyroid goiter with follicular lesion.  The patient had a known history of goiter for 7 to 8 years.  She had  developed mild dysphagia.  Ultrasound shows dominant nodules.  Fine-needle  aspiration showed follicular lesion for which a low-grade follicular  neoplasm could not be ruled out.  The patient comes to surgery for  thyroidectomy.   HOSPITAL COURSE:  The patient was admitted on March 23, 2003 and taken to  the operating room, where she underwent total thyroidectomy.  Postoperative  course was complicated by hypocalcemia.  The patient required repletion with  both intravenous calcium gluconate and oral calcium carbonate with vitamin  D.  Rocaltrol was added to her regimen.  However, on the third hospital day,  the patient developed a viral illness with nausea, emesis, and diarrhea.  This prevented oral supplementation.  The patient became profoundly  hypocalcemic, requiring intravenous resuscitation.  She was seen in  consultation with Dr. Romero Belling from [facility].  The patient's  calciums gradually stabilized.  She continued to receive large doses of oral  calcium and vitamin D derivatives.  Calcium slowly rose into a safe range  and the patient was prepared for discharge home on April 08, 2003.   DISCHARGE PLANNING:  The patient is discharged home, April 08, 2003, in  good condition, tolerating a regular diet,  and ambulating independently.   DISCHARGE MEDICATIONS:  Discharge medications include Vicodin, calcium  carbonate with vitamin D, Rocaltrol, and other home medications as per  usual.   FOLLOWUP:  The patient will be seen back at my office at [facility] in 1 week with laboratory work.   FINAL DIAGNOSIS:  Multinodular goiter with mild compressive symptoms,  hyperplastic nodules.   CONDITION AT DISCHARGE:  Improved.                                               Velora Heckler, M.D.    TMG/MEDQ  D:  04/18/2003  T:  04/18/2003  Job:  [id number]

## 2011-03-20 ENCOUNTER — Encounter: Payer: Self-pay | Admitting: Gynecology

## 2011-03-20 DIAGNOSIS — R32 Unspecified urinary incontinence: Secondary | ICD-10-CM | POA: Insufficient documentation

## 2011-03-27 ENCOUNTER — Encounter: Payer: 59 | Admitting: Obstetrics and Gynecology

## 2011-06-24 ENCOUNTER — Other Ambulatory Visit: Payer: Self-pay | Admitting: Obstetrics and Gynecology

## 2011-06-24 DIAGNOSIS — Z1231 Encounter for screening mammogram for malignant neoplasm of breast: Secondary | ICD-10-CM

## 2011-06-29 ENCOUNTER — Ambulatory Visit (INDEPENDENT_AMBULATORY_CARE_PROVIDER_SITE_OTHER): Payer: 59 | Admitting: Obstetrics and Gynecology

## 2011-06-29 ENCOUNTER — Encounter: Payer: Self-pay | Admitting: Obstetrics and Gynecology

## 2011-06-29 VITALS — BP 146/100 | Ht 66.0 in | Wt 188.0 lb

## 2011-06-29 DIAGNOSIS — C50412 Malignant neoplasm of upper-outer quadrant of left female breast: Secondary | ICD-10-CM | POA: Insufficient documentation

## 2011-06-29 DIAGNOSIS — E559 Vitamin D deficiency, unspecified: Secondary | ICD-10-CM | POA: Insufficient documentation

## 2011-06-29 DIAGNOSIS — Z780 Asymptomatic menopausal state: Secondary | ICD-10-CM

## 2011-06-29 DIAGNOSIS — N951 Menopausal and female climacteric states: Secondary | ICD-10-CM

## 2011-06-29 DIAGNOSIS — Z170 Estrogen receptor positive status [ER+]: Secondary | ICD-10-CM | POA: Insufficient documentation

## 2011-06-29 DIAGNOSIS — Z01419 Encounter for gynecological examination (general) (routine) without abnormal findings: Secondary | ICD-10-CM

## 2011-06-29 LAB — FOLLICLE STIMULATING HORMONE: FSH: 18.2 m[IU]/mL

## 2011-06-29 MED ORDER — ESTRADIOL 1 MG PO TABS: 1.0000 mg | ORAL_TABLET | Freq: Every day | ORAL | Status: DC

## 2011-06-29 NOTE — Progress Notes (Signed)
The patient came to see me today for her annual GYN exam. She is starting  to have severe hot flashes. She is waking up at night. It is giving her trouble during the day. She has lost energy. She does not have vaginal dryness. She sees Dr. Sharl Ma who is watching her thyroid and checked it 2 months ago. Her blood pressure was elevated today  And in his office. She has discussed it with her PCP who did not feel it needed treatment. She would like to change doctors anyway. She is scheduled her mammogram. She is having no vaginal bleeding. She is having no pelvic pain. She is under a lot of stress because her father is ill. Her urinary stress incontinence is getting worse. She has been doing Kegel exercises.  HEENT: Within normal limits. Kennon Portela present Neck: No masses. Supraclavicular lymph nodes: Not enlarged. Breasts: Examined in both sitting and lying position. Symmetrical without skin changes or masses. Abdomen: Soft no masses guarding or rebound. No hernias. Pelvic: External within normal limits. BUS within normal limits. Vaginal examination shows good estrogen effect, no cystocele enterocele or rectocele. Cervix and uterus absent. Adnexa within normal limits. Rectovaginal confirmatory. Extremities within normal limits.  Assessment: #1. Menopausal symptoms #2. Elevated blood pressure #3. Urinary stress incontinence  Plan: FSH checked. Started patient on estradiol 1 mg daily. Referred to Dr. Lenord Fellers for treatment of her blood pressure. Referred to Dr. Lorin Picket McDiarmid.

## 2011-06-30 LAB — URINALYSIS W MICROSCOPIC + REFLEX CULTURE
Bacteria, UA: NONE SEEN
Casts: NONE SEEN
Glucose, UA: NEGATIVE mg/dL
Hgb urine dipstick: NEGATIVE
Nitrite: NEGATIVE
pH: 7 (ref 5.0–8.0)

## 2011-07-03 ENCOUNTER — Ambulatory Visit: Payer: 59

## 2011-07-13 ENCOUNTER — Ambulatory Visit: Payer: 59

## 2012-01-12 ENCOUNTER — Encounter: Payer: Self-pay | Admitting: Internal Medicine

## 2012-04-16 ENCOUNTER — Emergency Department (HOSPITAL_COMMUNITY)
Admission: EM | Admit: 2012-04-16 | Discharge: 2012-04-16 | Disposition: A | Discharge status: Home / Self Care | Payer: 59 | Attending: Emergency Medicine | Admitting: Emergency Medicine

## 2012-04-16 ENCOUNTER — Encounter (HOSPITAL_COMMUNITY): Payer: Self-pay | Admitting: Emergency Medicine

## 2012-04-16 DIAGNOSIS — Z87448 Personal history of other diseases of urinary system: Secondary | ICD-10-CM | POA: Insufficient documentation

## 2012-04-16 DIAGNOSIS — Z85850 Personal history of malignant neoplasm of thyroid: Secondary | ICD-10-CM | POA: Insufficient documentation

## 2012-04-16 DIAGNOSIS — R109 Unspecified abdominal pain: Secondary | ICD-10-CM | POA: Insufficient documentation

## 2012-04-16 DIAGNOSIS — Z79899 Other long term (current) drug therapy: Secondary | ICD-10-CM | POA: Insufficient documentation

## 2012-04-16 DIAGNOSIS — E079 Disorder of thyroid, unspecified: Secondary | ICD-10-CM | POA: Insufficient documentation

## 2012-04-16 DIAGNOSIS — M545 Low back pain, unspecified: Secondary | ICD-10-CM | POA: Insufficient documentation

## 2012-04-16 DIAGNOSIS — Z9079 Acquired absence of other genital organ(s): Secondary | ICD-10-CM | POA: Insufficient documentation

## 2012-04-16 DIAGNOSIS — R110 Nausea: Secondary | ICD-10-CM | POA: Insufficient documentation

## 2012-04-16 DIAGNOSIS — F17200 Nicotine dependence, unspecified, uncomplicated: Secondary | ICD-10-CM | POA: Insufficient documentation

## 2012-04-16 DIAGNOSIS — M549 Dorsalgia, unspecified: Secondary | ICD-10-CM

## 2012-04-16 DIAGNOSIS — E559 Vitamin D deficiency, unspecified: Secondary | ICD-10-CM | POA: Insufficient documentation

## 2012-04-16 LAB — URINALYSIS, ROUTINE W REFLEX MICROSCOPIC
Bilirubin Urine: NEGATIVE
Ketones, ur: NEGATIVE mg/dL
Nitrite: NEGATIVE
Urobilinogen, UA: 0.2 mg/dL (ref 0.0–1.0)

## 2012-04-16 MED ORDER — HYDROCODONE-ACETAMINOPHEN 5-325 MG PO TABS
2.0000 | ORAL_TABLET | Freq: Once | ORAL | Status: AC
  Administered 2012-04-16: 2 via ORAL
  Filled 2012-04-16: qty 2

## 2012-04-16 MED ORDER — HYDROCODONE-ACETAMINOPHEN 5-325 MG PO TABS
1.0000 | ORAL_TABLET | Freq: Four times a day (QID) | ORAL | Status: DC | PRN
Start: 2012-04-16 — End: 2012-04-18

## 2012-04-16 NOTE — ED Notes (Signed)
States does not want pain meds at present.

## 2012-04-16 NOTE — ED Provider Notes (Addendum)
History     CSN: [id number]  Arrival date & time 04/16/12  1337   First MD Initiated Contact with Patient 04/16/12 1400      Chief Complaint  Patient presents with  . right side pain     (Consider location/radiation/quality/duration/timing/severity/associated sxs/prior treatment) The history is provided by the patient.  pt c/o right lower back pain for the past 2 days. States seems to bother her more at nighttime. Not related to eating. Dull, constant. Non radiating. No groin pain. No anterior, abdominal or pelvic pain. No dysuria or hematuria. Denies hx pyelo or kidney stone. No rash/lesions to area. No back injury or strain. No radicular pain or leg pain. No numbness/weakness to legs. No vaginal discharge or bleeding. Hx hysterectomy. No fever or chills. No nv.     Past Medical History  Diagnosis Date  . Urinary incontinence     USI  . Thyroid disease   . Cancer     Thyroid cancer  . Vitamin D deficiency     Past Surgical History  Procedure Date  . Vaginal hysterectomy   . Thyroidectomy   . Breast surgery     Breast cyst excised    Family History  Problem Relation Age of Onset  . Hypertension Father   . Cancer Father     Non-Hodgkins Lymphoma  . Diabetes Father   . Heart disease Father   . Hypertension Brother   . Heart disease Paternal Grandfather     History  Substance Use Topics  . Smoking status: Current Every Day Smoker -- 0.5 packs/day    Types: Cigarettes  . Smokeless tobacco: Not on file  . Alcohol Use: No     Comment: rare    OB History    Grav Para Term Preterm Abortions TAB SAB Ect Mult Living   3 3 3       3       Review of Systems  Constitutional: Negative for fever and chills.  HENT: Negative for neck pain.   Eyes: Negative for redness.  Respiratory: Negative for shortness of breath.   Cardiovascular: Negative for chest pain.  Gastrointestinal: Negative for vomiting, abdominal pain and diarrhea.  Genitourinary: Negative for dysuria,  hematuria, vaginal bleeding and vaginal discharge.  Musculoskeletal: Positive for back pain.  Skin: Negative for rash.  Neurological: Negative for headaches.  Hematological: Does not bruise/bleed easily.  Psychiatric/Behavioral: Negative for confusion.    Allergies  Other  Home Medications   Current Outpatient Rx  Name  Route  Sig  Dispense  Refill  . CLONAZEPAM 0.5 MG PO TABS   Oral   Take 0.5 mg by mouth 2 (two) times daily as needed.         Marland Kitchen VITAMIN B 12 PO   Oral   Take by mouth.           . ERGOCALCIFEROL 50000 UNITS PO CAPS   Oral   Take 50,000 Units by mouth 2 (two) times a week.          Marland Kitchen ESTRADIOL 1 MG PO TABS   Oral   Take 1 tablet (1 mg total) by mouth daily.   90 tablet   3   . LEVOTHYROXINE SODIUM 112 MCG PO TABS   Oral   Take 112 mcg by mouth 2 (two) times daily.           BP 164/102  Pulse 87  Temp 97.6 F (36.4 C) (Oral)  Resp 18  SpO2 99%  Physical Exam  Nursing note and vitals reviewed. Constitutional: She is oriented to person, place, and time. She appears well-developed and well-nourished. No distress.  HENT:  Mouth/Throat: Oropharynx is clear and moist.  Eyes: Conjunctivae normal are normal. No scleral icterus.  Neck: Neck supple. No tracheal deviation present.  Cardiovascular: Normal rate.   Pulmonary/Chest: Effort normal. No respiratory distress.  Abdominal: Soft. Normal appearance and bowel sounds are normal. She exhibits no distension and no mass. There is no tenderness. There is no rebound and no guarding.  Genitourinary:       No cva tenderness  Musculoskeletal: She exhibits no edema.       tls spine non tender, aligned. Right lumbar muscular tenderness.   Neurological: She is alert and oriented to person, place, and time.       Motor intact bil. Steady gait.   Skin: Skin is warm and dry. No rash noted.       No shingles/rash in area of pain  Psychiatric: She has a normal mood and affect.    ED Course   Procedures (including critical care time)   Results for orders placed during the hospital encounter of 04/16/12  URINALYSIS, ROUTINE W REFLEX MICROSCOPIC      Component Value Range   Color, Urine YELLOW  YELLOW   APPearance CLEAR  CLEAR   Specific Gravity, Urine 1.005  1.005 - 1.030   pH 6.5  5.0 - 8.0   Glucose, UA NEGATIVE  NEGATIVE mg/dL   Hgb urine dipstick NEGATIVE  NEGATIVE   Bilirubin Urine NEGATIVE  NEGATIVE   Ketones, ur NEGATIVE  NEGATIVE mg/dL   Protein, ur NEGATIVE  NEGATIVE mg/dL   Urobilinogen, UA 0.2  0.0 - 1.0 mg/dL   Nitrite NEGATIVE  NEGATIVE   Leukocytes, UA NEGATIVE  NEGATIVE     MDM  Only med pta was ibuprofen. Hydrocodone po. Pt has ride here, does not have to drive.   Ua.  Reviewed nursing notes and prior charts for additional history.    reviewed prior charts, prior abd u/s neg for gallstones. Pt has no anterior pain, no abd or pelvis pain.  ua negative.    +lumbar muscular tenderness.   On review prior visits/clinic, bp also noted to be similarly high - discussed w pt, need for close pcp f/u this week, also for recheck then.        Suzi Roots, MD 04/16/12 1527  Suzi Roots, MD 04/16/12 [phone]

## 2012-04-16 NOTE — ED Notes (Addendum)
Flank pain/ right side pain hurts since Thursday morning. Denies diarrhea/vomiting- some nausea. Ate some spicy chicken Wednesday night, this started about 2-3 o/clock in morning thursday

## 2012-04-18 ENCOUNTER — Emergency Department (HOSPITAL_COMMUNITY)
Admission: EM | Admit: 2012-04-18 | Discharge: 2012-04-18 | Disposition: A | Discharge status: Home / Self Care | Payer: 59 | Attending: Emergency Medicine | Admitting: Emergency Medicine

## 2012-04-18 ENCOUNTER — Emergency Department (HOSPITAL_COMMUNITY): Payer: 59

## 2012-04-18 ENCOUNTER — Encounter (HOSPITAL_COMMUNITY): Payer: Self-pay | Admitting: Emergency Medicine

## 2012-04-18 DIAGNOSIS — E079 Disorder of thyroid, unspecified: Secondary | ICD-10-CM | POA: Insufficient documentation

## 2012-04-18 DIAGNOSIS — Z85850 Personal history of malignant neoplasm of thyroid: Secondary | ICD-10-CM | POA: Insufficient documentation

## 2012-04-18 DIAGNOSIS — F17200 Nicotine dependence, unspecified, uncomplicated: Secondary | ICD-10-CM | POA: Insufficient documentation

## 2012-04-18 DIAGNOSIS — Z79899 Other long term (current) drug therapy: Secondary | ICD-10-CM | POA: Insufficient documentation

## 2012-04-18 DIAGNOSIS — R110 Nausea: Secondary | ICD-10-CM | POA: Insufficient documentation

## 2012-04-18 DIAGNOSIS — Z87448 Personal history of other diseases of urinary system: Secondary | ICD-10-CM | POA: Insufficient documentation

## 2012-04-18 DIAGNOSIS — M549 Dorsalgia, unspecified: Secondary | ICD-10-CM | POA: Insufficient documentation

## 2012-04-18 DIAGNOSIS — E559 Vitamin D deficiency, unspecified: Secondary | ICD-10-CM | POA: Insufficient documentation

## 2012-04-18 DIAGNOSIS — R1031 Right lower quadrant pain: Secondary | ICD-10-CM | POA: Insufficient documentation

## 2012-04-18 DIAGNOSIS — R748 Abnormal levels of other serum enzymes: Secondary | ICD-10-CM

## 2012-04-18 DIAGNOSIS — R109 Unspecified abdominal pain: Secondary | ICD-10-CM

## 2012-04-18 LAB — CBC WITH DIFFERENTIAL/PLATELET
Basophils Absolute: 0 10*3/uL (ref 0.0–0.1)
Eosinophils Absolute: 0.1 10*3/uL (ref 0.0–0.7)
Eosinophils Relative: 1 % (ref 0–5)
Lymphs Abs: 3.8 10*3/uL (ref 0.7–4.0)
MCH: 30.7 pg (ref 26.0–34.0)
MCV: 90.5 fL (ref 78.0–100.0)
Platelets: 321 10*3/uL (ref 150–400)
RDW: 13.1 % (ref 11.5–15.5)

## 2012-04-18 LAB — HEPATIC FUNCTION PANEL
Alkaline Phosphatase: 80 U/L (ref 39–117)
Bilirubin, Direct: <0.1 mg/dL (ref 0.0–0.3)
Total Bilirubin: 0.3 mg/dL (ref 0.3–1.2)
Total Protein: 7.4 g/dL (ref 6.0–8.3)

## 2012-04-18 LAB — URINALYSIS, ROUTINE W REFLEX MICROSCOPIC
Bilirubin Urine: NEGATIVE
Hgb urine dipstick: NEGATIVE
Protein, ur: NEGATIVE mg/dL
Urobilinogen, UA: 0.2 mg/dL (ref 0.0–1.0)

## 2012-04-18 LAB — BASIC METABOLIC PANEL
Calcium: 9.2 mg/dL (ref 8.4–10.5)
Creatinine, Ser: 0.82 mg/dL (ref 0.50–1.10)
GFR calc non Af Amer: 82 mL/min — ABNORMAL LOW (ref >90)
Glucose, Bld: 90 mg/dL (ref 70–99)
Sodium: 138 mEq/L (ref 135–145)

## 2012-04-18 IMAGING — CR DG CHEST 2V
2 series · 2 of 2 positions shown · non-contrast
Comparison: [DATE]

CLINICAL DATA: Pain

CHEST - 2 VIEW

[w chest pa]
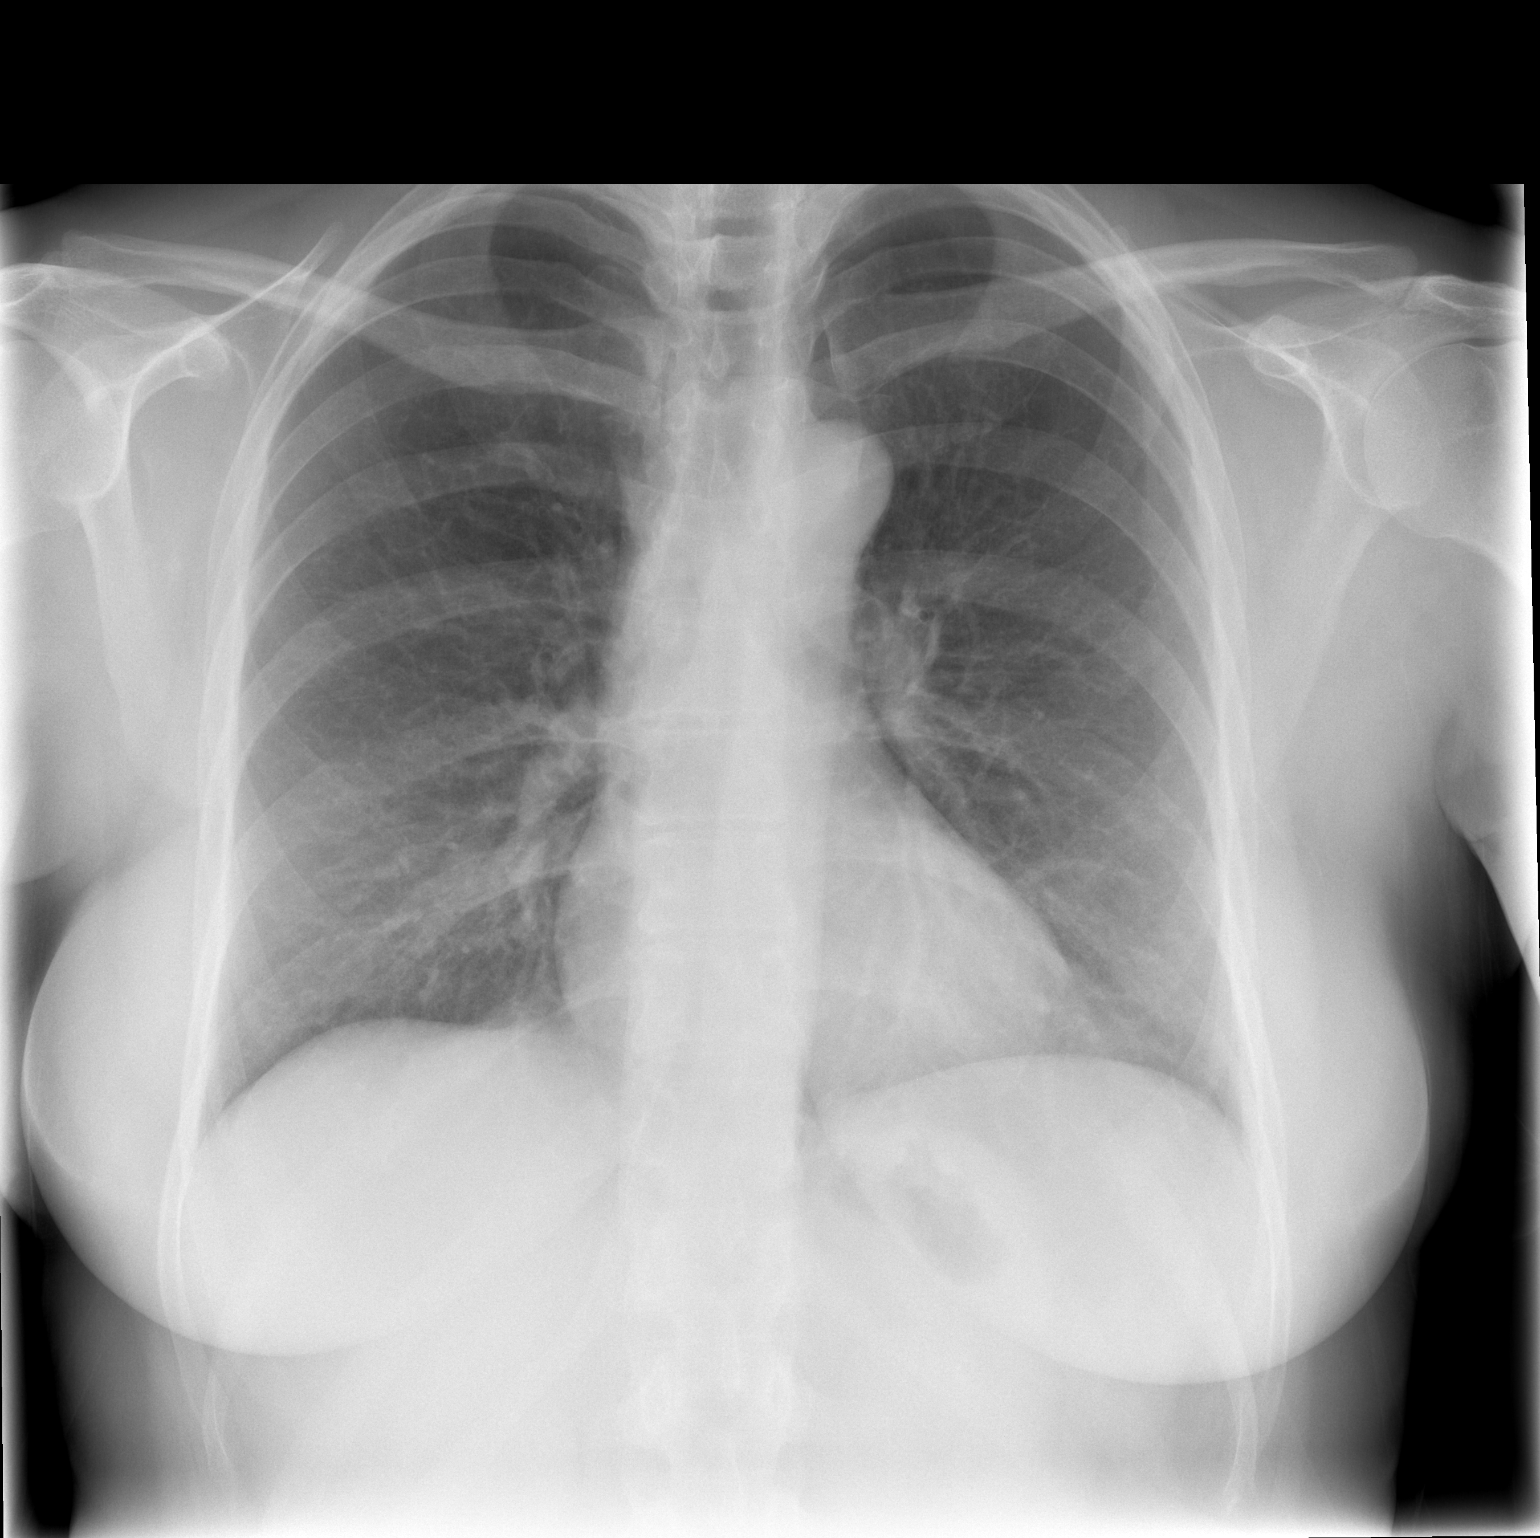

[w chest lat]
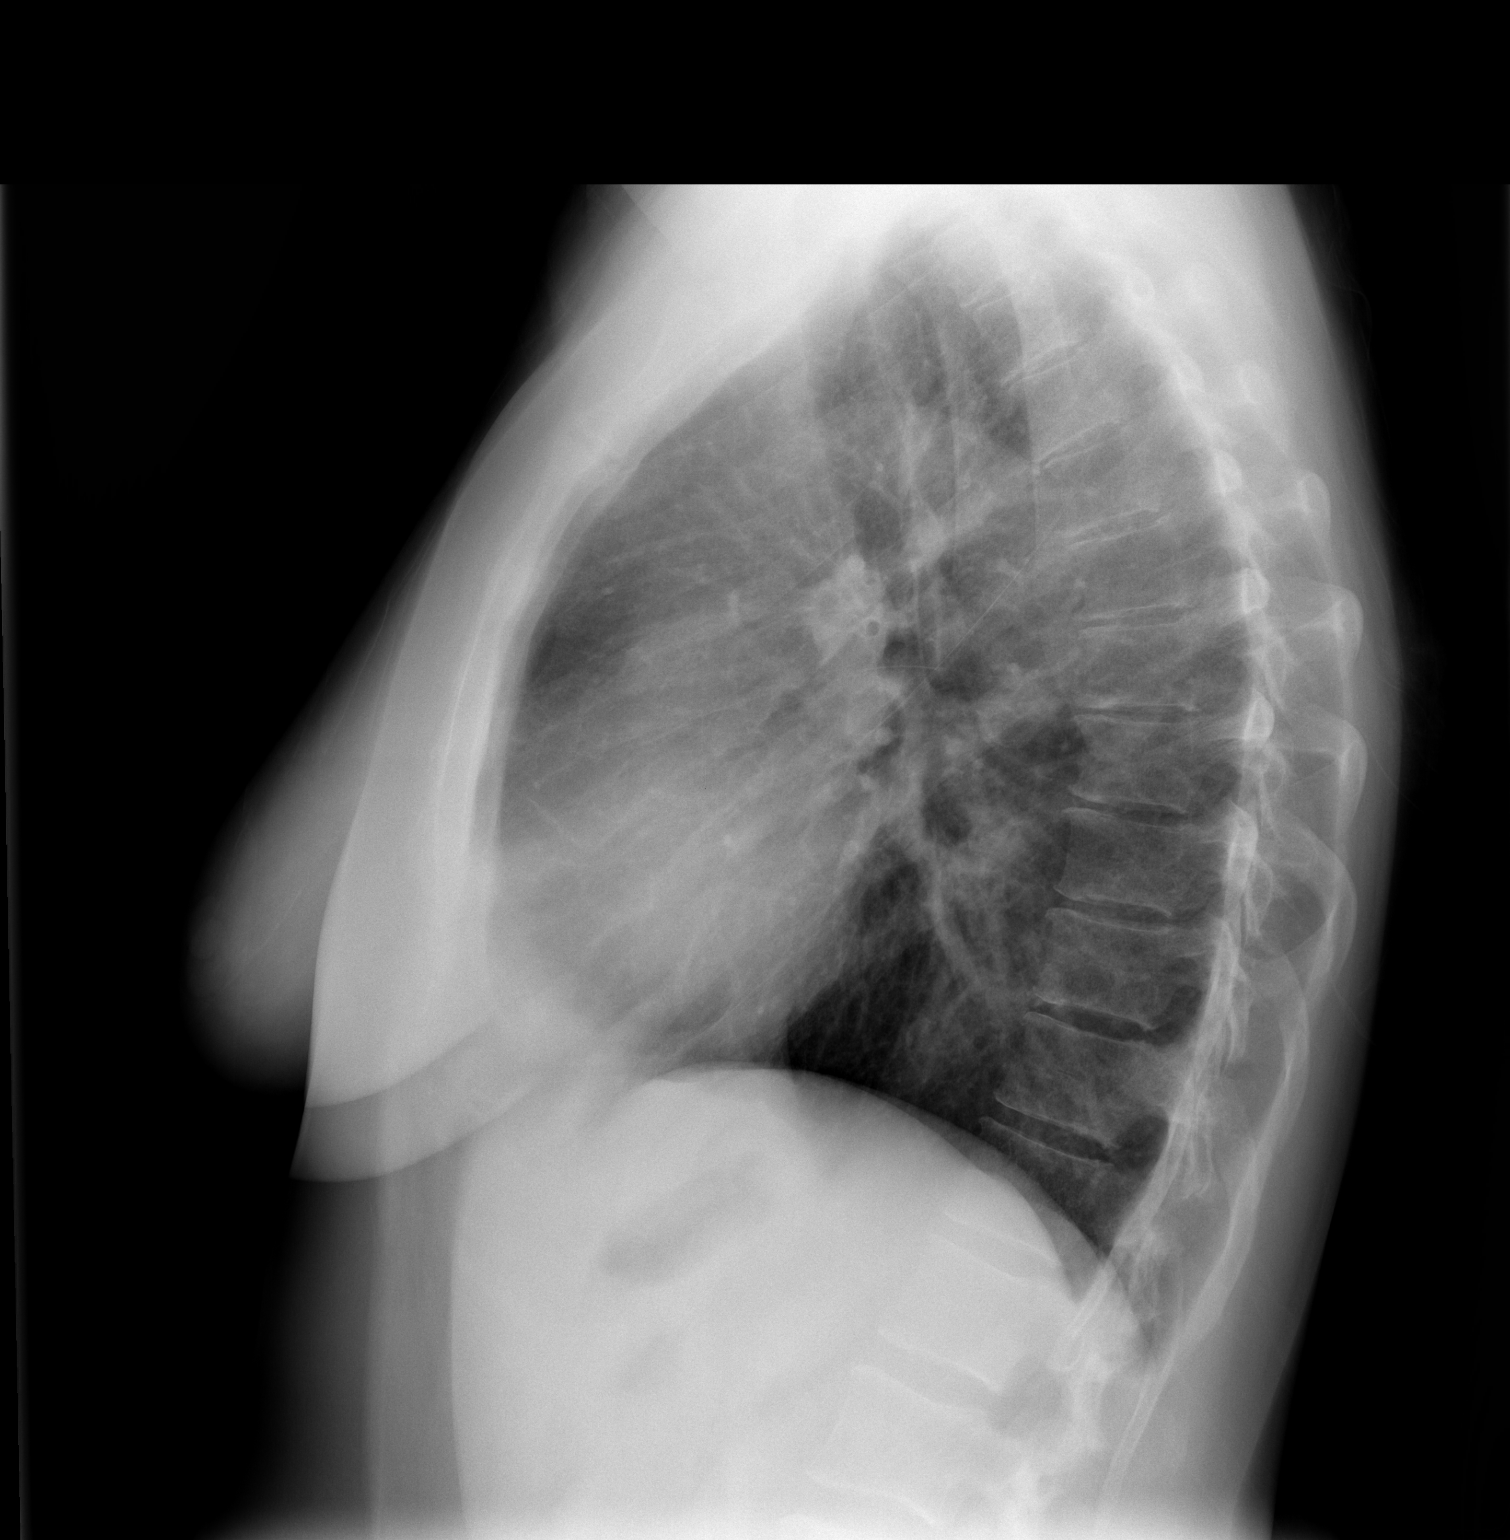

[2 of 2 positions shown; findings below may reference images not displayed]

FINDINGS: The lungs are clear without focal consolidation, edema,
effusion or pneumothorax.  Cardiopericardial silhouette is within
normal limits for size.  Imaged bony structures of the thorax are
intact.
IMPRESSION: Stable.  No acute findings.

## 2012-04-18 IMAGING — US US ABDOMEN COMPLETE
1 series · 13 of 25 positions shown · non-contrast
Comparison: [DATE]
Correlation:  CT abdomen and pelvis [DATE]

CLINICAL DATA: Right upper quadrant abdominal pain, elevated
lipase, question cholelithiasis

ULTRASOUND ABDOMEN:
TECHNIQUE: Sonography of upper abdominal structures was performed.

[Series 1: us abdomen complete · 0.28mm/px · 13 of 79 slices shown]
[im 1/79]
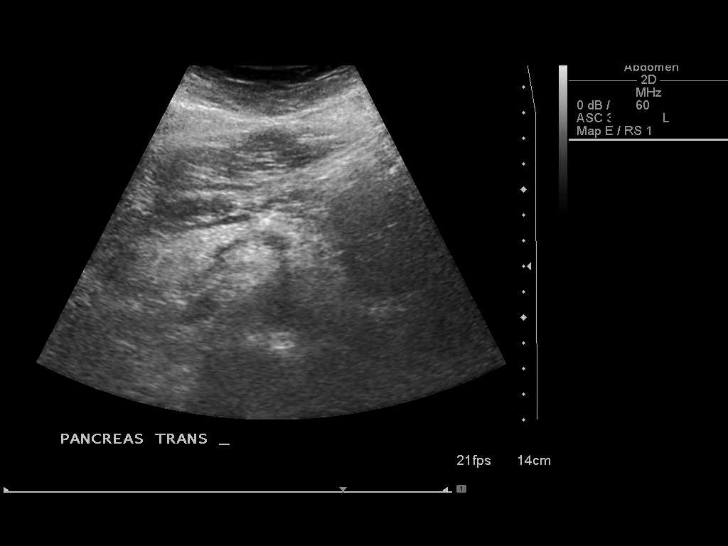
[im 7/79]
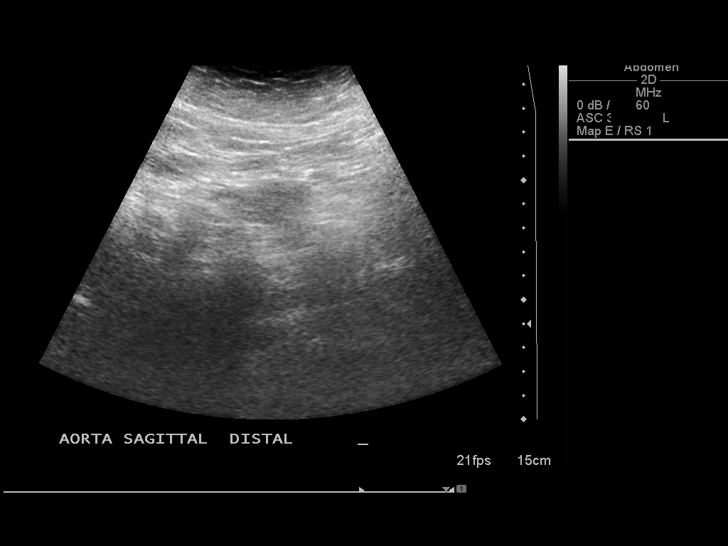
[im 14/79]
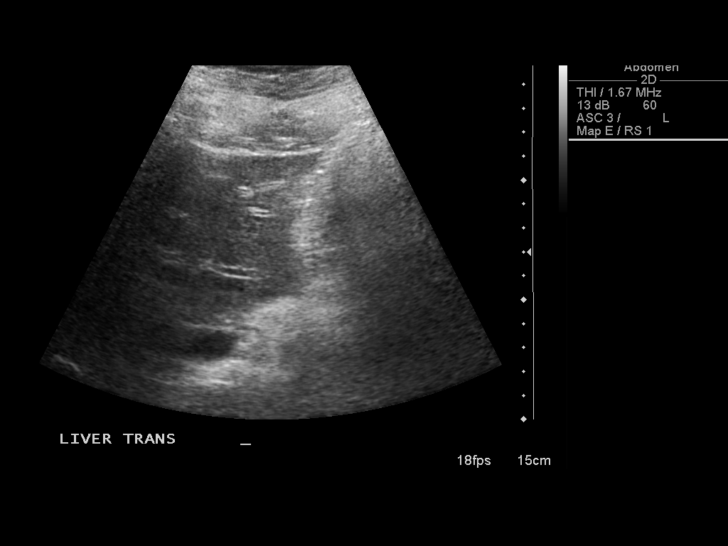
[im 20/79]
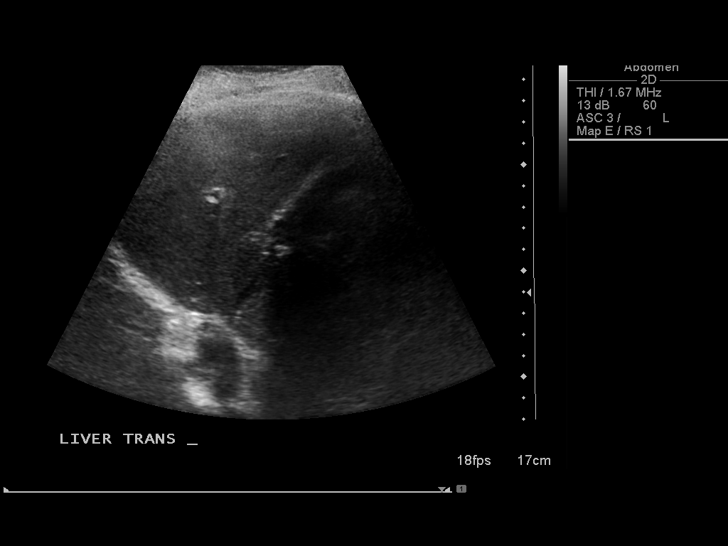
[im 27/79]
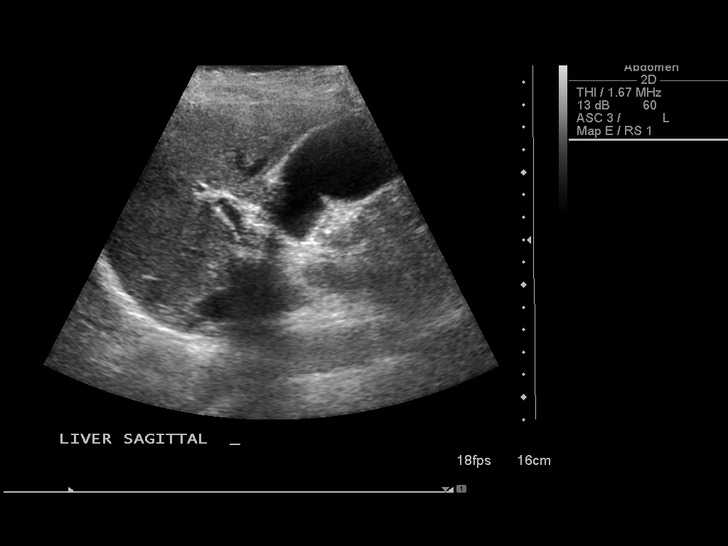
[im 33/79]
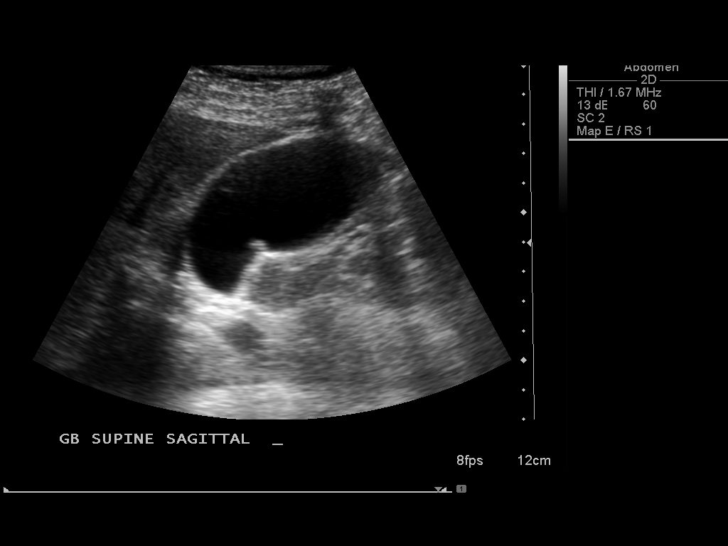
[im 40/79]
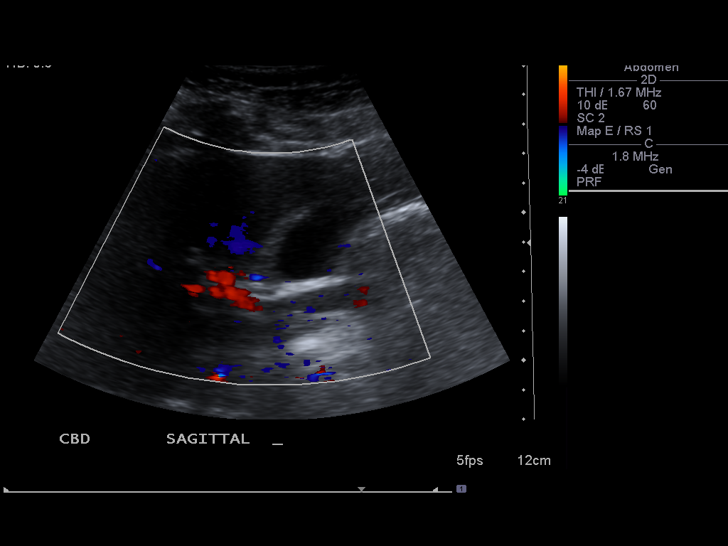
[im 46/79]
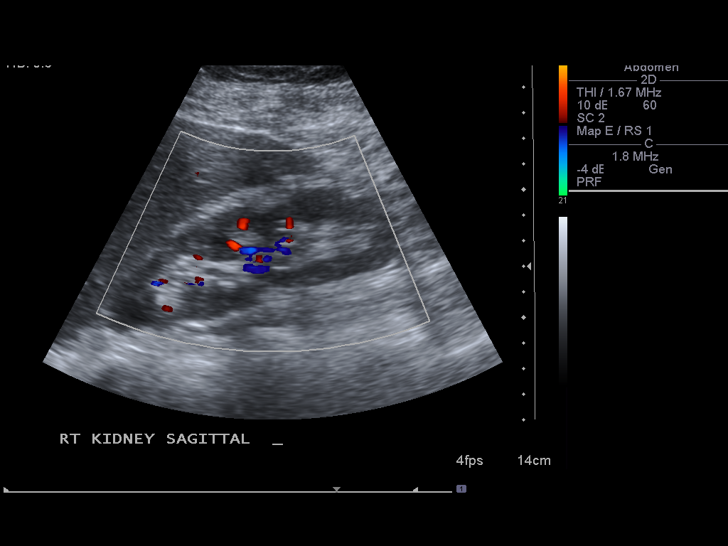
[im 53/79]
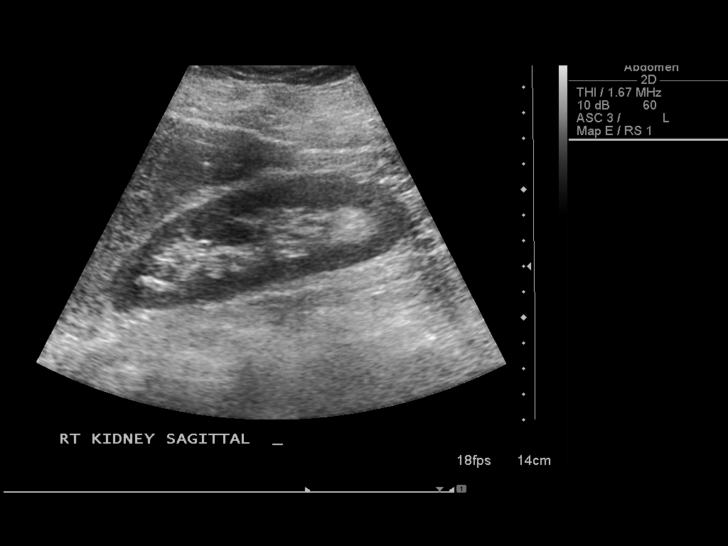
[im 59/79]
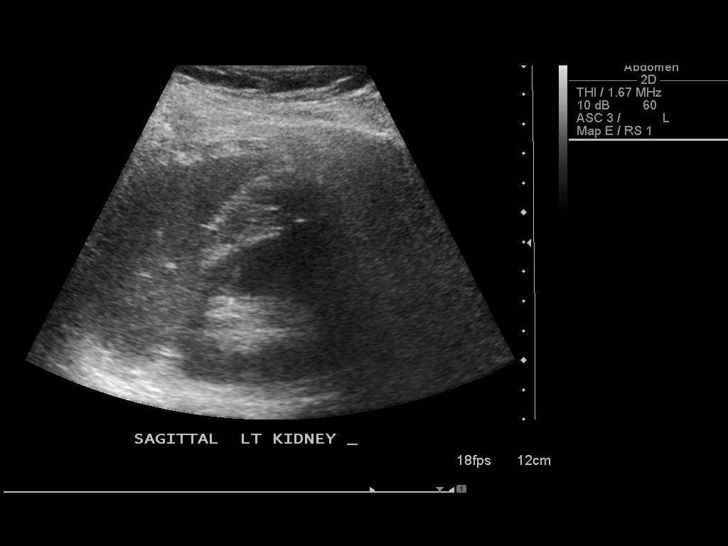
[im 66/79]
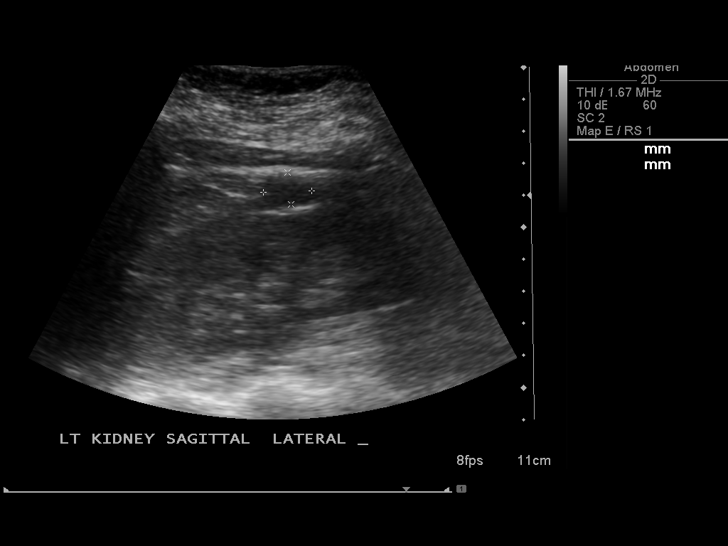
[im 72/79]
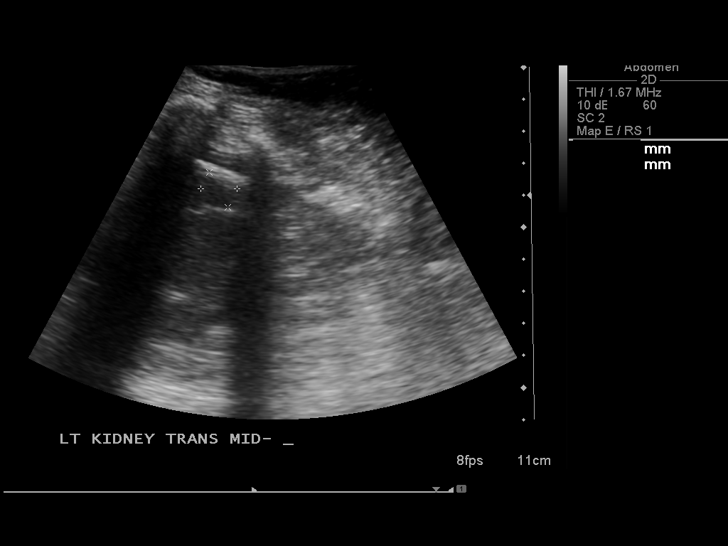
[im 79/79]
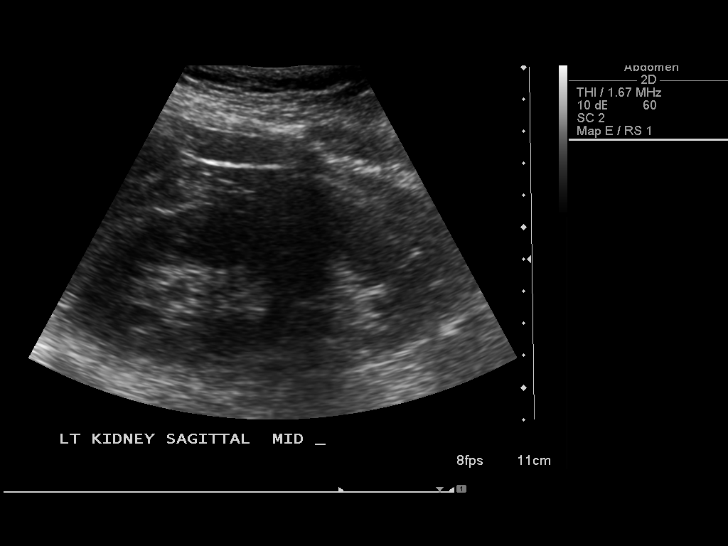

[13 of 25 positions shown; findings below may reference images not displayed]

Gallbladder:  Normally distended without stones or wall thickening.
No pericholecystic fluid or sonographic Murphy's sign.

Common bile duct:  Normal caliber 3 mm diameter

Liver:  Slightly inhomogeneous echogenicity without focal mass or
nodule.  Hepatopetal portal venous flow.

IVC:  Normal appearance

Pancreas:  Portions of pancreatic body, proximal tail and head are
normal in appearance with remainder obscured by bowel gas.

Spleen:  Normal appearance, 5.9 cm length

Right kidney:  12.2 cm length. Normal morphology without mass or
hydronephrosis.

Left kidney:  10.8 cm length.  No definite solid mass or
hydronephrosis. Small cyst identified at midportion of left and the
on preceding CT is not definitely visualized.

Aorta:  Proximal portion normal caliber, distally obscured by bowel
gas

Other:  No free fluid
IMPRESSION: Incomplete visualization of pancreas and aorta.
No evidence of cholelithiasis or biliary dilatation.

## 2012-04-18 IMAGING — CT CT ABD-PELV W/ CM
2 of 5 series · 16 of 46 positions shown, 18 images · IV contrast (APPLIED)
Comparison: Abdominal ultrasound [DATE]

CLINICAL DATA: Right abdominal pain.  Back pain.

CT ABDOMEN AND PELVIS WITH CONTRAST
TECHNIQUE: Multidetector CT imaging of the abdomen and pelvis was
performed following the standard protocol during bolus
administration of intravenous contrast.
Contrast: 80mL OMNIPAQUE IOHEXOL 300 MG/ML  SOLN

[Series 2: abd/pelv with 5.0 b31f st · axial · 0.69mm/px · z∈[-456,-36]mm · 13 of 94 slices shown, 15 images]
[im 5/94  soft-tissue]
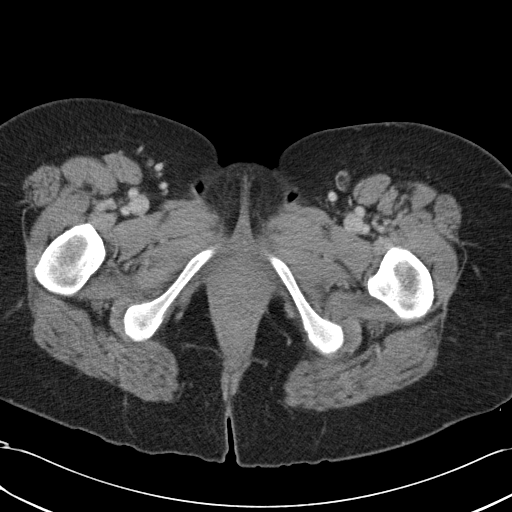
[im 5/94  bone]
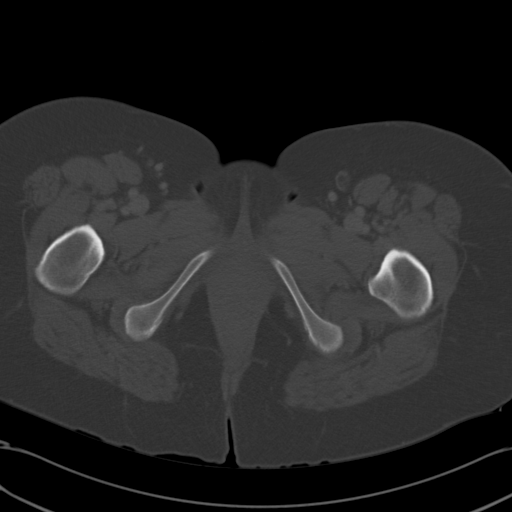
[im 14/94  soft-tissue]
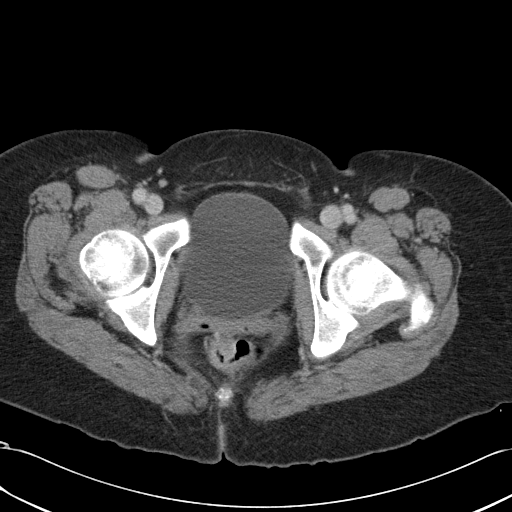
[im 18/94  soft-tissue]
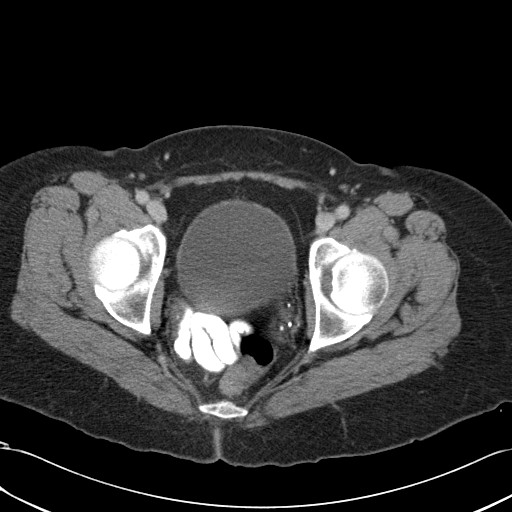
[im 27/94  soft-tissue]
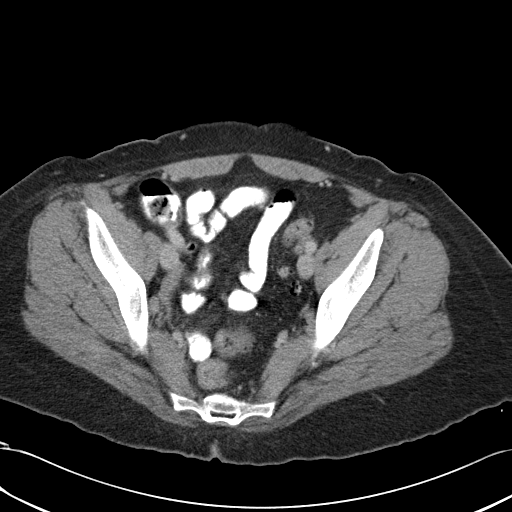
[im 32/94  soft-tissue]
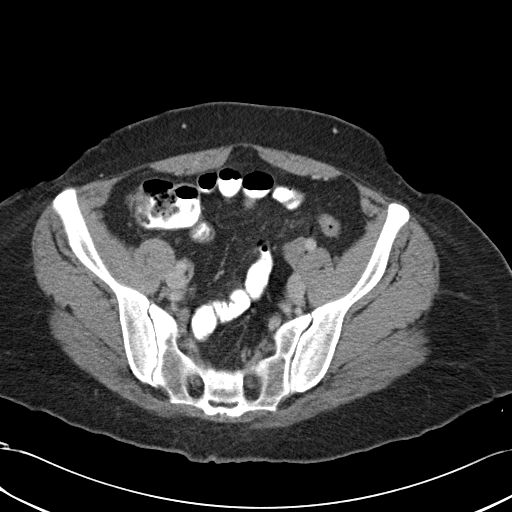
[im 40/94  soft-tissue]
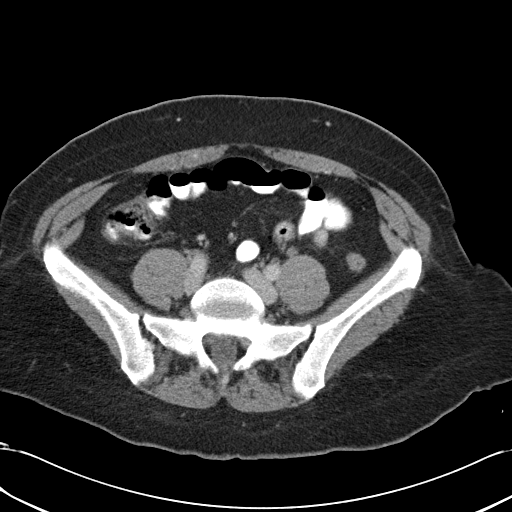
[im 49/94  soft-tissue]
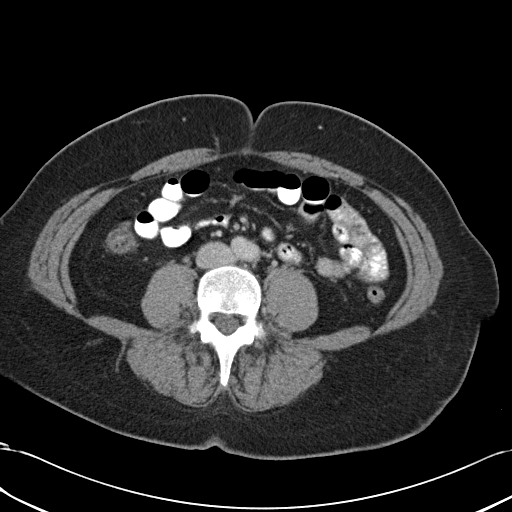
[im 54/94  soft-tissue]
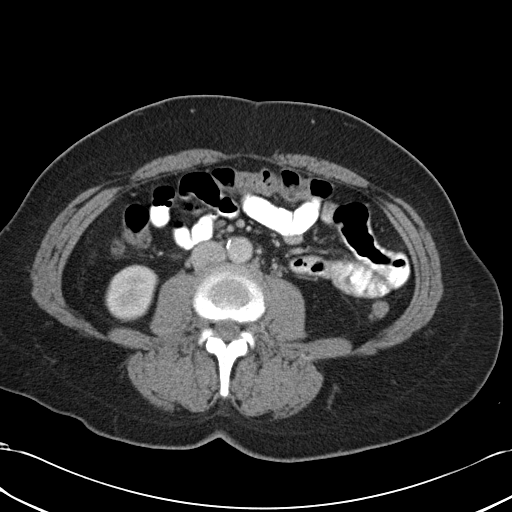
[im 63/94  soft-tissue]
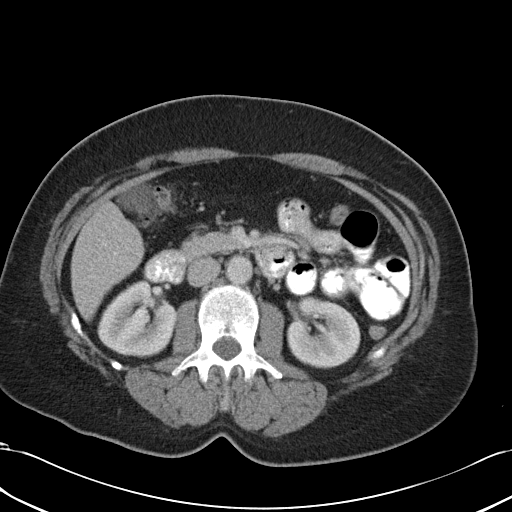
[im 63/94  bone]
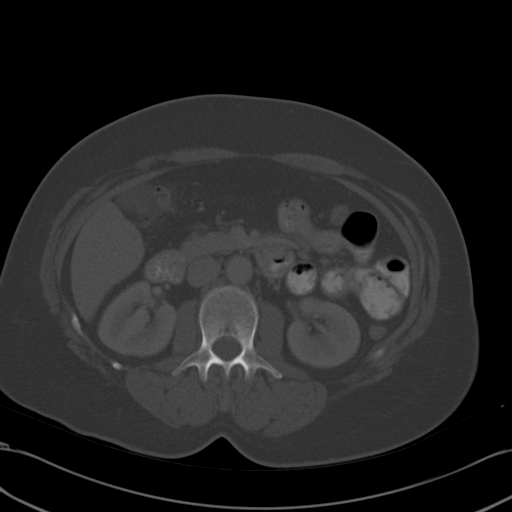
[im 67/94  soft-tissue]
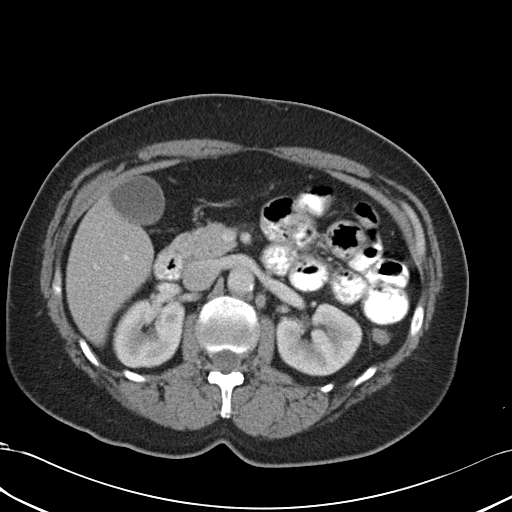
[im 76/94  soft-tissue]
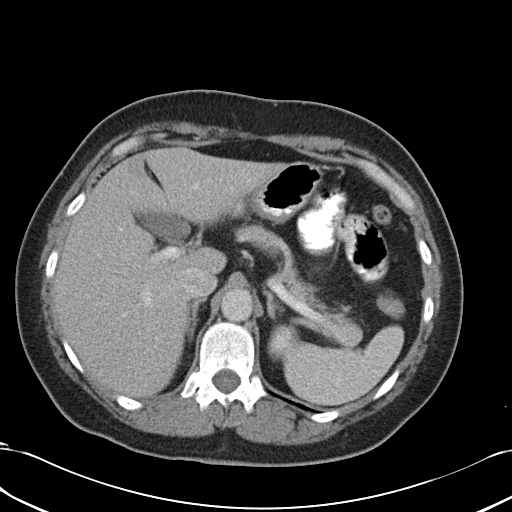
[im 80/94  soft-tissue]
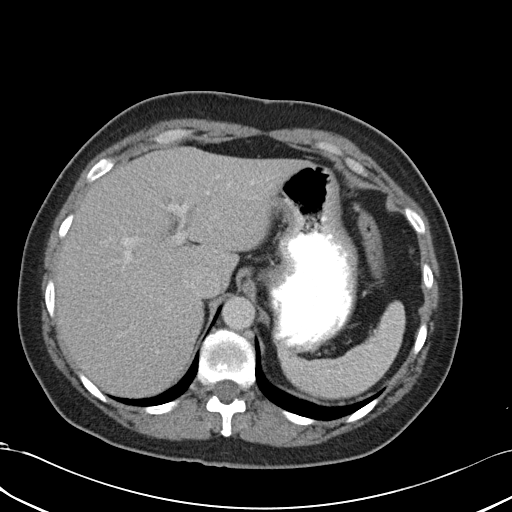
[im 89/94  soft-tissue]
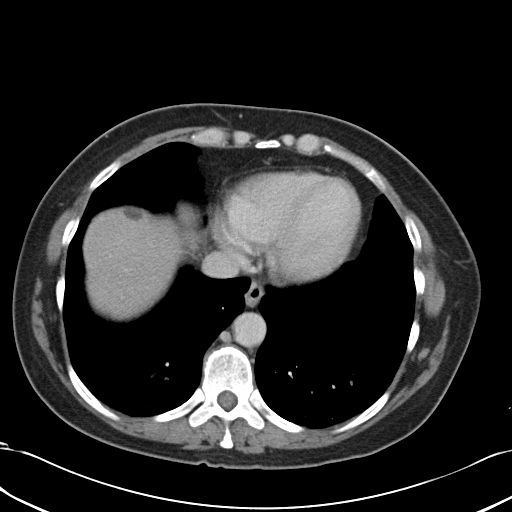

[Series 5: coronals · coronal · 0.71mm/px · 3 of 113 slices shown]
[im 38/113  soft-tissue]
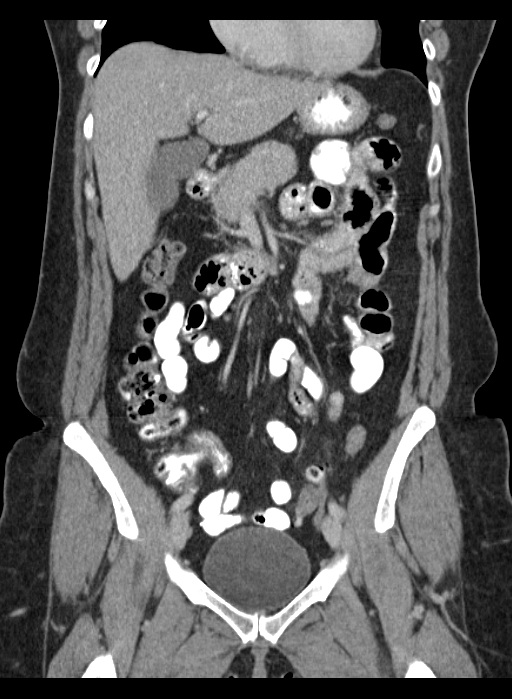
[im 50/113  soft-tissue]
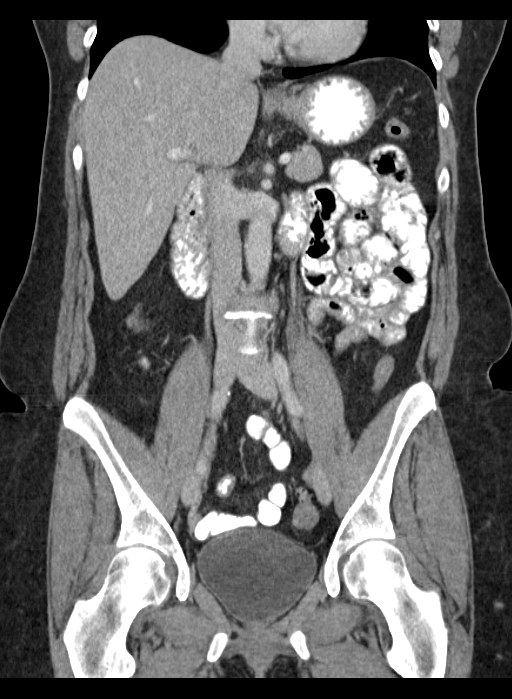
[im 63/113  soft-tissue]
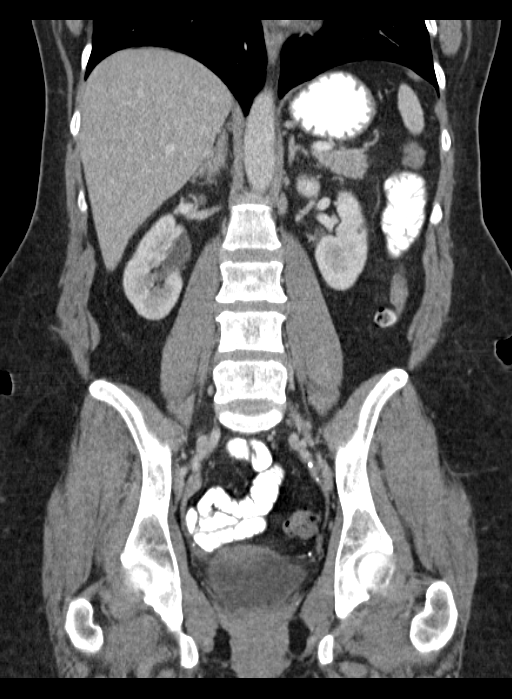

[16 of 46 positions shown; findings below may reference images not displayed]

FINDINGS: Lung bases are clear.  No evidence for free
intraperitoneal air.

1.5 cm low density structure along the hepatic dome is most
suggestive for a cyst.  Otherwise, normal appearance of the liver,
gallbladder portal and venous system.  Normal appearance of
pancreas, spleen, adrenal glands and the right kidney. There is an
irregular shaped low density structure along the lateral aspect of
the left kidney mid pole.  The structure roughly measures 1.5 cm in
greatest dimension.  It is not clear if this was present on the
previous ultrasound.  Difficult exclude peripheral or internal
enhancement of this cystic structure.  There may be a punctate cyst
along the anterior aspect of the left kidney.  In addition, there
may be a nonobstructive 2 mm stone in the right kidney.  This is
best seen on the coronal reformats, image number 69.

There is a normal appearance of the ovarian tissue.  Uterus is
absent.  There is fluid in the urinary bladder.  No significant
free fluid or lymphadenopathy within the abdomen or pelvis.  A few
diverticula involving the sigmoid colon without acute inflammatory
changes.  Normal appearance of the appendix.

No acute bony abnormality.
IMPRESSION: Possible nonobstructive right kidney stone.

Diverticulosis without acute inflammatory changes. Difficult to
exclude occult diverticulitis.

Indeterminate 1.5 cm low density structure in the left kidney.
This structure most likely represents a cyst but cannot exclude a
complex cyst. This structure could be more definitively evaluated
with an MRI of the kidneys.

## 2012-04-18 MED ORDER — IOHEXOL 300 MG/ML  SOLN
25.0000 mL | INTRAMUSCULAR | Status: AC
  Administered 2012-04-18 (×2): 25 mL via ORAL

## 2012-04-18 MED ORDER — OXYCODONE-ACETAMINOPHEN 5-325 MG PO TABS: 1.0000 | ORAL_TABLET | ORAL | Status: DC | PRN

## 2012-04-18 MED ORDER — MORPHINE SULFATE 4 MG/ML IJ SOLN
6.0000 mg | Freq: Once | INTRAMUSCULAR | Status: AC
  Administered 2012-04-18: 4 mg via INTRAVENOUS
  Filled 2012-04-18: qty 2

## 2012-04-18 MED ORDER — ONDANSETRON 8 MG PO TBDP: 8.0000 mg | ORAL_TABLET | Freq: Three times a day (TID) | ORAL | Status: DC | PRN

## 2012-04-18 MED ORDER — IOHEXOL 300 MG/ML  SOLN
80.0000 mL | Freq: Once | INTRAMUSCULAR | Status: AC | PRN
  Administered 2012-04-18: 80 mL via INTRAVENOUS

## 2012-04-18 NOTE — ED Notes (Signed)
Pt sts RLQ pain seen here for same on Saturday with pain into back x several days; pt sts URI sx x 2 weeks with painful cough

## 2012-04-18 NOTE — ED Notes (Addendum)
Pt reports lower right back pain since Wednesday. States it is worse at night, especially if she has eaten. Denies pain at this time. Reports intermittent nausea with pain. Denies urinary syx. Pt also reports chest "tightness" since Wednesday with NP cough.

## 2012-04-18 NOTE — ED Provider Notes (Signed)
History     CSN: [id number]  Arrival date & time 04/18/12  1144   First MD Initiated Contact with Patient 04/18/12 1305      Chief Complaint  Patient presents with  . Abdominal Pain  . Back Pain     HPI The patient reports right lower quadrant pain with some radiation in the back over the past several days.  Today she's had more back pain and some of this back pain radiates down to her right leg.  She has nausea associated with eating and her nausea and back pain usually occur several hours after eating.  She still has her gallbladder.  She denies urinary symptoms.  No fevers or chills.  Her pain is intermittent and waxes and wanes.  She called her primary care physician today for followup for ongoing pain however due to the severity of her pain and inability to hear from her primary care physician timely fashion she can emergency department for evaluation.  Symptoms are mild to moderate in severity this time.  The majority of her pain is located in the right lower lateral back.  She denies weakness of her upper lower extremities.  Her recent falls or trauma.   Past Medical History  Diagnosis Date  . Urinary incontinence     USI  . Thyroid disease   . Cancer     Thyroid cancer  . Vitamin D deficiency     Past Surgical History  Procedure Date  . Vaginal hysterectomy   . Thyroidectomy   . Breast surgery     Breast cyst excised    Family History  Problem Relation Age of Onset  . Hypertension Father   . Cancer Father     Non-Hodgkins Lymphoma  . Diabetes Father   . Heart disease Father   . Hypertension Brother   . Heart disease Paternal Grandfather     History  Substance Use Topics  . Smoking status: Current Every Day Smoker -- 0.5 packs/day    Types: Cigarettes  . Smokeless tobacco: Not on file  . Alcohol Use: No     Comment: rare    OB History    Grav Para Term Preterm Abortions TAB SAB Ect Mult Living   3 3 3       3       Review of Systems  All other  systems reviewed and are negative.    Allergies  Other  Home Medications   Current Outpatient Rx  Name  Route  Sig  Dispense  Refill  . CLONAZEPAM 0.5 MG PO TABS   Oral   Take 0.5 mg by mouth 3 (three) times daily as needed. For anxiety         . VITAMIN B-12 IJ   Injection   Inject 1,000 mcg as directed every 30 (thirty) days.         . ERGOCALCIFEROL 50000 UNITS PO CAPS   Oral   Take 50,000 Units by mouth 2 (two) times a week. On Tues & Sun         . HYDROCODONE-ACETAMINOPHEN 5-325 MG PO TABS   Oral   Take 1-2 tablets by mouth every 6 (six) hours as needed. For pain         . IBUPROFEN 200 MG PO TABS   Oral   Take 600 mg by mouth every 6 (six) hours as needed. For pain         . LEVOTHYROXINE SODIUM 112 MCG PO TABS  Oral   Take 112 mcg by mouth 2 (two) times daily.           BP 152/88  Pulse 59  Temp 97.2 F (36.2 C) (Oral)  Resp 18  SpO2 99%  Physical Exam  Nursing note and vitals reviewed. Constitutional: She is oriented to person, place, and time. She appears well-developed and well-nourished. No distress.  HENT:  Head: Normocephalic and atraumatic.  Eyes: EOM are normal.  Neck: Normal range of motion.  Cardiovascular: Normal rate, regular rhythm and normal heart sounds.   Pulmonary/Chest: Effort normal and breath sounds normal.  Abdominal: Soft. She exhibits no distension. There is no tenderness.  Musculoskeletal: Normal range of motion.       No thoracic or lumbar spinal tenderness.  Patient with mild right-sided paraspinal tenderness  Neurological: She is alert and oriented to person, place, and time.  Skin: Skin is warm and dry.  Psychiatric: She has a normal mood and affect. Judgment normal.    ED Course  Procedures (including critical care time)  Labs Reviewed  CBC WITH DIFFERENTIAL - Abnormal; Notable for the following:    WBC 11.7 (*)     All other components within normal limits  BASIC METABOLIC PANEL - Abnormal; Notable  for the following:    GFR calc non Af Amer 82 (*)     All other components within normal limits  LIPASE, BLOOD - Abnormal; Notable for the following:    Lipase 141 (*)     All other components within normal limits  URINALYSIS, ROUTINE W REFLEX MICROSCOPIC  HEPATIC FUNCTION PANEL   Dg Chest 2 View  04/18/2012  *RADIOLOGY REPORT*  Clinical Data: Pain  CHEST - 2 VIEW  Comparison: 10/05/2003  Findings: The lungs are clear without focal consolidation, edema, effusion or pneumothorax.  Cardiopericardial silhouette is within normal limits for size.  Imaged bony structures of the thorax are intact.  IMPRESSION: Stable.  No acute findings.   Original Report Authenticated By: Kennith Center, M.D.    US Abdomen Complete  04/18/2012  *RADIOLOGY REPORT*  Clinical Data:  Right upper quadrant abdominal pain, elevated lipase, question cholelithiasis  ULTRASOUND ABDOMEN:  Technique:  Sonography of upper abdominal structures was performed.  Comparison:  04/29/2009 Correlation:  CT abdomen and pelvis 04/18/2012  Gallbladder:  Normally distended without stones or wall thickening. No pericholecystic fluid or sonographic Murphy's sign.  Common bile duct:  Normal caliber 3 mm diameter  Liver:  Slightly inhomogeneous echogenicity without focal mass or nodule.  Hepatopetal portal venous flow.  IVC:  Normal appearance  Pancreas:  Portions of pancreatic body, proximal tail and head are normal in appearance with remainder obscured by bowel gas.  Spleen:  Normal appearance, 5.9 cm length  Right kidney:  12.2 cm length. Normal morphology without mass or hydronephrosis.  Left kidney:  10.8 cm length.  No definite solid mass or hydronephrosis. Small cyst identified at midportion of left and the on preceding CT is not definitely visualized.  Aorta:  Proximal portion normal caliber, distally obscured by bowel gas  Other:  No free fluid  IMPRESSION: Incomplete visualization of pancreas and aorta. No evidence of cholelithiasis or biliary  dilatation.   Original Report Authenticated By: Ulyses Southward, M.D.    Ct Abdomen Pelvis W Contrast  04/18/2012  *RADIOLOGY REPORT*  Clinical Data: Right abdominal pain.  Back pain.  CT ABDOMEN AND PELVIS WITH CONTRAST  Technique:  Multidetector CT imaging of the abdomen and pelvis was performed following the standard  protocol during bolus administration of intravenous contrast.  Contrast: 80mL OMNIPAQUE IOHEXOL 300 MG/ML  SOLN  Comparison: Abdominal ultrasound 04/29/2009  Findings: Lung bases are clear.  No evidence for free intraperitoneal air.  1.5 cm low density structure along the hepatic dome is most suggestive for a cyst.  Otherwise, normal appearance of the liver, gallbladder portal and venous system.  Normal appearance of pancreas, spleen, adrenal glands and the right kidney. There is an irregular shaped low density structure along the lateral aspect of the left kidney mid pole.  The structure roughly measures 1.5 cm in greatest dimension.  It is not clear if this was present on the previous ultrasound.  Difficult exclude peripheral or internal enhancement of this cystic structure.  There may be a punctate cyst along the anterior aspect of the left kidney.  In addition, there may be a nonobstructive 2 mm stone in the right kidney.  This is best seen on the coronal reformats, image number 69.  There is a normal appearance of the ovarian tissue.  Uterus is absent.  There is fluid in the urinary bladder.  No significant free fluid or lymphadenopathy within the abdomen or pelvis.  A few diverticula involving the sigmoid colon without acute inflammatory changes.  Normal appearance of the appendix.  No acute bony abnormality.  IMPRESSION:  Possible nonobstructive right kidney stone.  Diverticulosis without acute inflammatory changes. Difficult to exclude occult diverticulitis.  Indeterminate 1.5 cm low density structure in the left kidney. This structure most likely represents a cyst but cannot exclude a complex  cyst. This structure could be more definitively evaluated with an MRI of the kidneys.   Original Report Authenticated By: Richarda Overlie, M.D.    I personally reviewed the imaging tests through PACS system I reviewed available ER/hospitalization records through the EMR   1. Abdominal pain   2. Elevation of serum amylase level with elevation of serum lipase level       MDM  7:58 PM The patient is feeling much better at this time.  CT scan and ultrasound are without significant abnormalities.  The patient is a mild elevation in her lipase which is very nonspecific as she does not have any severe upper abdominal discomfort.  She does have some back pain and nausea with associated eating.  She's not drink alcohol.  Close PCP followup.  Home with pain medicine nausea medicine.  Clear liquid diet for 24 hours recommended.        Lyanne Co, MD 04/18/12 2001

## 2012-04-19 MED ORDER — ONDANSETRON HCL 4 MG/2ML IJ SOLN
INTRAMUSCULAR | Status: AC
  Filled 2012-04-19: qty 4

## 2012-04-25 ENCOUNTER — Encounter: Payer: Self-pay | Admitting: Internal Medicine

## 2012-04-30 ENCOUNTER — Other Ambulatory Visit: Payer: Self-pay

## 2012-07-07 ENCOUNTER — Encounter: Payer: Self-pay | Admitting: Internal Medicine

## 2012-08-05 ENCOUNTER — Other Ambulatory Visit: Payer: Self-pay | Admitting: Obstetrics and Gynecology

## 2012-09-06 ENCOUNTER — Other Ambulatory Visit: Payer: Self-pay

## 2012-09-06 DIAGNOSIS — Z1231 Encounter for screening mammogram for malignant neoplasm of breast: Secondary | ICD-10-CM

## 2012-09-19 ENCOUNTER — Encounter: Payer: Self-pay | Admitting: Women's Health

## 2012-09-19 ENCOUNTER — Ambulatory Visit (INDEPENDENT_AMBULATORY_CARE_PROVIDER_SITE_OTHER): Payer: 59 | Admitting: Women's Health

## 2012-09-19 VITALS — BP 124/84 | Ht 66.0 in | Wt 186.0 lb

## 2012-09-19 DIAGNOSIS — Z01419 Encounter for gynecological examination (general) (routine) without abnormal findings: Secondary | ICD-10-CM

## 2012-09-19 DIAGNOSIS — Z79890 Hormone replacement therapy (postmenopausal): Secondary | ICD-10-CM

## 2012-09-19 MED ORDER — ESTRADIOL 0.05 MG/24HR TD PTWK: 1.0000 | MEDICATED_PATCH | TRANSDERMAL | Status: DC

## 2012-09-19 NOTE — Progress Notes (Signed)
Bethany [name] Sep 29, 1960 [id number]    History:    The patient presents for annual exam.  TVH  1998 for menorrhagia. History of normal Paps. Thyroidectomy/thyroid cancer 2004 on Synthroid. Hypertension managed by primary care. Had been on Estrace for menopausal symptoms but felt nauseous. Having numerous hot flushes. Occasional stress incontinence. Benign breast cyst 1997. Quit smoking March 2014.   Past medical history, past surgical history, family history and social history were all reviewed and documented in the EPIC chart. Owns jewelry business. 3 grandsons and 2 grandsons all doing well. Father numerous health problems heart disease, hypertension, non-Hodgkin's lymphoma and diabetes. Mother healthy and maternal grandmother 59.   ROS:  A  ROS was performed and pertinent positives and negatives are included in the history.  Exam:  Filed Vitals:   09/19/12 1513  BP: 124/84    General appearance:  Normal Head/Neck:  Normal, without cervical or supraclavicular adenopathy. Thyroid:  Symmetrical, normal in size, without palpable masses or nodularity. Respiratory  Effort:  Normal  Auscultation:  Clear without wheezing or rhonchi Cardiovascular  Auscultation:  Regular rate, without rubs, murmurs or gallops  Edema/varicosities:  Not grossly evident Abdominal  Soft,nontender, without masses, guarding or rebound.  Liver/spleen:  No organomegaly noted  Hernia:  None appreciated  Skin  Inspection:  Grossly normal  Palpation:  Grossly normal Neurologic/psychiatric  Orientation:  Normal with appropriate conversation.  Mood/affect:  Normal  Genitourinary    Breasts: Examined lying and sitting.     Right: Without masses, retractions, discharge or axillary adenopathy.     Left: Without masses, retractions, discharge or axillary adenopathy.   Inguinal/mons:  Normal without inguinal adenopathy  External genitalia:  Normal  BUS/Urethra/Skene's glands:  Normal  Bladder:   Normal  Vagina:  Normal  Cervix: Uterus: absent  Adnexa/parametria:     Rt: Without masses or tenderness.   Lt: Without masses or tenderness.  Anus and perineum: Normal  Digital rectal exam: Normal sphincter tone without palpated masses or tenderness  Assessment/Plan:  51 y.o.M. WF G3 P3  for annual exam.     Stress incontinence declines intervention Hypertension/hypothyroid-primary care manages labs and meds Menopausal symptoms  Plan: Reviewed options, will try estradiol 0.05 patch weekly, reviewed proper use, risk for blood clots, strokes and breast cancer. Instructed to call if no relief of symptoms. SBE's, continue annual mammogram, has history of dense breast, 3-D tomography reviewed and encouraged. DEXA, will schedule, home safety, fall prevention and importance of exercise in relationship to bone health reviewed. Vitamin D 2000 daily encouraged. Will schedule colonoscopy.    Harrington Challenger [facility], 3:44 PM 09/19/2012

## 2012-09-19 NOTE — Patient Instructions (Signed)
Health Recommendations for Postmenopausal Women Respected and ongoing research has looked at the most common causes of death, disability, and poor quality of life in postmenopausal women. The causes include heart disease, diseases of blood vessels, diabetes, depression, cancer, and bone loss (osteoporosis). Many things can be done to help lower the chances of developing these and other common problems: CARDIOVASCULAR DISEASE Heart Disease: A heart attack is a medical emergency. Know the signs and symptoms of a heart attack. Below are things women can do to reduce their risk for heart disease.   Do not smoke. If you smoke, quit.  Aim for a healthy weight. Being overweight causes many preventable deaths. Eat a healthy and balanced diet and drink an adequate amount of liquids.  Get moving. Make a commitment to be more physically active. Aim for 30 minutes of activity on most, if not all days of the week.  Eat for heart health. Choose a diet that is low in saturated fat and cholesterol and eliminate trans fat. Include whole grains, vegetables, and fruits. Read and understand the labels on food containers before buying.  Know your numbers. Ask your caregiver to check your blood pressure, cholesterol (total, HDL, LDL, triglycerides) and blood glucose. Work with your caregiver on improving your entire clinical picture.  High blood pressure. Limit or stop your table salt intake (try salt substitute and food seasonings). Avoid salty foods and drinks. Read labels on food containers before buying. Eating well and exercising can help control high blood pressure. STROKE  Stroke is a medical emergency. Stroke may be the result of a blood clot in a blood vessel in the brain or by a brain hemorrhage (bleeding). Know the signs and symptoms of a stroke. To lower the risk of developing a stroke:  Avoid fatty foods.  Quit smoking.  Control your diabetes, blood pressure, and irregular heart rate. THROMBOPHLEBITIS  (BLOOD CLOT) OF THE LEG  Becoming overweight and leading a stationary lifestyle may also contribute to developing blood clots. Controlling your diet and exercising will help lower the risk of developing blood clots. CANCER SCREENING  Breast Cancer: Take steps to reduce your risk of breast cancer.  You should practice "breast self-awareness." This means understanding the normal appearance and feel of your breasts and should include breast self-examination. Any changes detected, no matter how small, should be reported to your caregiver.  After age 40, you should have a clinical breast exam (CBE) every year.  Starting at age 40, you should consider having a mammogram (breast X-ray) every year.  If you have a family history of breast cancer, talk to your caregiver about genetic screening.  If you are at high risk for breast cancer, talk to your caregiver about having an MRI and a mammogram every year.  Intestinal or Stomach Cancer: Tests to consider are a rectal exam, fecal occult blood, sigmoidoscopy, and colonoscopy. Women who are high risk may need to be screened at an earlier age and more often.  Cervical Cancer:  Beginning at age 30, you should have a Pap test every 3 years as long as the past 3 Pap tests have been normal.  If you have had past treatment for cervical cancer or a condition that could lead to cancer, you need Pap tests and screening for cancer for at least 20 years after your treatment.  If you had a hysterectomy for a problem that was not cancer or a condition that could lead to cancer, then you no longer need Pap tests.    If you are between ages 65 and 70, and you have had normal Pap tests going back 10 years, you no longer need Pap tests.  If Pap tests have been discontinued, risk factors (such as a new sexual partner) need to be reassessed to determine if screening should be resumed.  Some medical problems can increase the chance of getting cervical cancer. In these  cases, your caregiver may recommend more frequent screening and Pap tests.  Uterine Cancer: If you have vaginal bleeding after reaching menopause, you should notify your caregiver.  Ovarian cancer: Other than yearly pelvic exams, there are no reliable tests available to screen for ovarian cancer at this time except for yearly pelvic exams.  Lung Cancer: Yearly chest X-rays can detect lung cancer and should be done on high risk women, such as cigarette smokers and women with chronic lung disease (emphysema).  Skin Cancer: A complete body skin exam should be done at your yearly examination. Avoid overexposure to the sun and ultraviolet light lamps. Use a strong sun block cream when in the sun. All of these things are important in lowering the risk of skin cancer. MENOPAUSE Menopause Symptoms: Hormone therapy products are effective for treating symptoms associated with menopause:  Moderate to severe hot flashes.  Night sweats.  Mood swings.  Headaches.  Tiredness.  Loss of sex drive.  Insomnia.  Other symptoms. Hormone replacement carries certain risks, especially in older women. Women who use or are thinking about using estrogen or estrogen with progestin treatments should discuss that with their caregiver. Your caregiver will help you understand the benefits and risks. The ideal dose of hormone replacement therapy is not known. The Food and Drug Administration (FDA) has concluded that hormone therapy should be used only at the lowest doses and for the shortest amount of time to reach treatment goals.  OSTEOPOROSIS Protecting Against Bone Loss and Preventing Fracture: If you use hormone therapy for prevention of bone loss (osteoporosis), the risks for bone loss must outweigh the risk of the therapy. Ask your caregiver about other medications known to be safe and effective for preventing bone loss and fractures. To guard against bone loss or fractures, the following is recommended:  If  you are less than age 50, take 1000 mg of calcium and at least 600 mg of Vitamin D per day.  If you are greater than age 50 but less than age 70, take 1200 mg of calcium and at least 600 mg of Vitamin D per day.  If you are greater than age 70, take 1200 mg of calcium and at least 800 mg of Vitamin D per day. Smoking and excessive alcohol intake increases the risk of osteoporosis. Eat foods rich in calcium and vitamin D and do weight bearing exercises several times a week as your caregiver suggests. DIABETES Diabetes Melitus: If you have Type I or Type 2 diabetes, you should keep your blood sugar under control with diet, exercise and recommended medication. Avoid too many sweets, starchy and fatty foods. Being overweight can make control more difficult. COGNITION AND MEMORY Cognition and Memory: Menopausal hormone therapy is not recommended for the prevention of cognitive disorders such as Alzheimer's disease or memory loss.  DEPRESSION  Depression may occur at any age, but is common in elderly women. The reasons may be because of physical, medical, social (loneliness), or financial problems and needs. If you are experiencing depression because of medical problems and control of symptoms, talk to your caregiver about this. Physical activity and   exercise may help with mood and sleep. Community and volunteer involvement may help your sense of value and worth. If you have depression and you feel that the problem is getting worse or becoming severe, talk to your caregiver about treatment options that are best for you. ACCIDENTS  Accidents are common and can be serious in the elderly woman. Prepare your house to prevent accidents. Eliminate throw rugs, place hand bars in the bath, shower and toilet areas. Avoid wearing high heeled shoes or walking on wet, snowy, and icy areas. Limit or stop driving if you have vision or hearing problems, or you feel you are unsteady with you movements and  reflexes. HEPATITIS C Hepatitis C is a type of viral infection affecting the liver. It is spread mainly through contact with blood from an infected person. It can be treated, but if left untreated, it can lead to severe liver damage over years. Many people who are infected do not know that the virus is in their blood. If you are a "baby-boomer", it is recommended that you have one screening test for Hepatitis C. IMMUNIZATIONS  Several immunizations are important to consider having during your senior years, including:   Tetanus, diptheria, and pertussis booster shot.  Influenza every year before the flu season begins.  Pneumonia vaccine.  Shingles vaccine.  Others as indicated based on your specific needs. Talk to your caregiver about these. Document Released: 04/24/2005 Document Revised: 02/17/2012 Document Reviewed: 12/19/2007 [employer] Patient Information 2014 [employer].  

## 2012-09-22 ENCOUNTER — Ambulatory Visit
Admission: RE | Admit: 2012-09-22 | Discharge: 2012-09-22 | Disposition: A | Discharge status: Home / Self Care | Payer: 59 | Source: Ambulatory Visit

## 2012-09-22 DIAGNOSIS — Z1231 Encounter for screening mammogram for malignant neoplasm of breast: Secondary | ICD-10-CM

## 2012-11-07 ENCOUNTER — Encounter: Payer: Self-pay | Admitting: Internal Medicine

## 2012-12-15 ENCOUNTER — Other Ambulatory Visit: Payer: Self-pay

## 2012-12-15 DIAGNOSIS — Z79890 Hormone replacement therapy (postmenopausal): Secondary | ICD-10-CM

## 2012-12-15 MED ORDER — ESTRADIOL 0.05 MG/24HR TD PTWK: 1.0000 | MEDICATED_PATCH | TRANSDERMAL | Status: DC

## 2012-12-15 NOTE — Telephone Encounter (Signed)
Pharmacy requested 90 day supply with this refill. Rx escribed.

## 2013-01-11 ENCOUNTER — Other Ambulatory Visit: Payer: Self-pay

## 2013-01-11 DIAGNOSIS — Z79890 Hormone replacement therapy (postmenopausal): Secondary | ICD-10-CM

## 2013-01-19 ENCOUNTER — Other Ambulatory Visit: Payer: Self-pay

## 2014-01-15 ENCOUNTER — Encounter: Payer: Self-pay | Admitting: Women's Health

## 2014-01-16 ENCOUNTER — Other Ambulatory Visit: Payer: Self-pay

## 2014-01-16 ENCOUNTER — Other Ambulatory Visit: Payer: Self-pay | Admitting: Internal Medicine

## 2014-01-16 DIAGNOSIS — N6310 Unspecified lump in the right breast, unspecified quadrant: Secondary | ICD-10-CM

## 2014-01-26 ENCOUNTER — Ambulatory Visit
Admission: RE | Admit: 2014-01-26 | Discharge: 2014-01-26 | Disposition: A | Discharge status: Home / Self Care | Payer: 59 | Source: Ambulatory Visit | Attending: Internal Medicine | Admitting: Internal Medicine

## 2014-01-26 DIAGNOSIS — N6310 Unspecified lump in the right breast, unspecified quadrant: Secondary | ICD-10-CM

## 2014-04-03 ENCOUNTER — Encounter: Payer: Self-pay | Admitting: Internal Medicine

## 2014-06-19 ENCOUNTER — Emergency Department (HOSPITAL_COMMUNITY): Payer: 59

## 2014-06-19 ENCOUNTER — Encounter (HOSPITAL_COMMUNITY): Payer: Self-pay | Admitting: Emergency Medicine

## 2014-06-19 ENCOUNTER — Observation Stay (HOSPITAL_COMMUNITY)
Admission: EM | Admit: 2014-06-19 | Discharge: 2014-06-20 | Disposition: A | Discharge status: Home / Self Care | Payer: 59 | Attending: Internal Medicine | Admitting: Internal Medicine

## 2014-06-19 DIAGNOSIS — E780 Pure hypercholesterolemia: Secondary | ICD-10-CM | POA: Diagnosis not present

## 2014-06-19 DIAGNOSIS — Z8249 Family history of ischemic heart disease and other diseases of the circulatory system: Secondary | ICD-10-CM | POA: Diagnosis not present

## 2014-06-19 DIAGNOSIS — Z87891 Personal history of nicotine dependence: Secondary | ICD-10-CM | POA: Diagnosis not present

## 2014-06-19 DIAGNOSIS — R079 Chest pain, unspecified: Secondary | ICD-10-CM | POA: Diagnosis not present

## 2014-06-19 DIAGNOSIS — E559 Vitamin D deficiency, unspecified: Secondary | ICD-10-CM | POA: Insufficient documentation

## 2014-06-19 DIAGNOSIS — Z85850 Personal history of malignant neoplasm of thyroid: Secondary | ICD-10-CM | POA: Insufficient documentation

## 2014-06-19 DIAGNOSIS — I10 Essential (primary) hypertension: Secondary | ICD-10-CM | POA: Diagnosis not present

## 2014-06-19 DIAGNOSIS — I209 Angina pectoris, unspecified: Secondary | ICD-10-CM | POA: Insufficient documentation

## 2014-06-19 DIAGNOSIS — Z90710 Acquired absence of both cervix and uterus: Secondary | ICD-10-CM | POA: Diagnosis not present

## 2014-06-19 DIAGNOSIS — E039 Hypothyroidism, unspecified: Secondary | ICD-10-CM | POA: Diagnosis not present

## 2014-06-19 DIAGNOSIS — K219 Gastro-esophageal reflux disease without esophagitis: Secondary | ICD-10-CM | POA: Insufficient documentation

## 2014-06-19 HISTORY — DX: Chest pain, unspecified: R07.9

## 2014-06-19 HISTORY — DX: Anxiety disorder, unspecified: F41.9

## 2014-06-19 HISTORY — DX: Hypothyroidism, unspecified: E03.9

## 2014-06-19 HISTORY — DX: Personal history of other diseases of the digestive system: Z87.19

## 2014-06-19 LAB — BASIC METABOLIC PANEL
Anion gap: 10 (ref 5–15)
BUN: 6 mg/dL (ref 6–23)
CALCIUM: 9 mg/dL (ref 8.4–10.5)
CO2: 23 mmol/L (ref 19–32)
CREATININE: 0.84 mg/dL (ref 0.50–1.10)
Chloride: 104 mmol/L (ref 96–112)
GFR, EST NON AFRICAN AMERICAN: 79 mL/min — AB (ref >90)
GLUCOSE: 100 mg/dL — AB (ref 70–99)
Potassium: 4.2 mmol/L (ref 3.5–5.1)
Sodium: 137 mmol/L (ref 135–145)

## 2014-06-19 LAB — CBC
HEMATOCRIT: 39.4 % (ref 36.0–46.0)
HEMOGLOBIN: 12.7 g/dL (ref 12.0–15.0)
MCH: 29.3 pg (ref 26.0–34.0)
MCHC: 32.2 g/dL (ref 30.0–36.0)
MCV: 91 fL (ref 78.0–100.0)
Platelets: 306 10*3/uL (ref 150–400)
RBC: 4.33 MIL/uL (ref 3.87–5.11)
RDW: 13.4 % (ref 11.5–15.5)
WBC: 8.2 10*3/uL (ref 4.0–10.5)

## 2014-06-19 LAB — LIPID PANEL
Cholesterol: 192 mg/dL (ref 0–200)
HDL: 52 mg/dL (ref >39)
LDL Cholesterol: 122 mg/dL — ABNORMAL HIGH (ref 0–99)
Total CHOL/HDL Ratio: 3.7 RATIO
Triglycerides: 90 mg/dL (ref <150)
VLDL: 18 mg/dL (ref 0–40)

## 2014-06-19 LAB — CBC WITH DIFFERENTIAL/PLATELET
BASOS ABS: 0 10*3/uL (ref 0.0–0.1)
Basophils Relative: 0 % (ref 0–1)
EOS ABS: 0.1 10*3/uL (ref 0.0–0.7)
EOS PCT: 1 % (ref 0–5)
HEMATOCRIT: 43.3 % (ref 36.0–46.0)
Hemoglobin: 14.3 g/dL (ref 12.0–15.0)
LYMPHS PCT: 28 % (ref 12–46)
Lymphs Abs: 3.3 10*3/uL (ref 0.7–4.0)
MCH: 30.2 pg (ref 26.0–34.0)
MCHC: 33 g/dL (ref 30.0–36.0)
MCV: 91.4 fL (ref 78.0–100.0)
MONO ABS: 0.4 10*3/uL (ref 0.1–1.0)
Monocytes Relative: 3 % (ref 3–12)
Neutro Abs: 8.1 10*3/uL — ABNORMAL HIGH (ref 1.7–7.7)
Neutrophils Relative %: 68 % (ref 43–77)
Platelets: 342 10*3/uL (ref 150–400)
RBC: 4.74 MIL/uL (ref 3.87–5.11)
RDW: 13.4 % (ref 11.5–15.5)
WBC: 11.9 10*3/uL — AB (ref 4.0–10.5)

## 2014-06-19 LAB — TROPONIN I: Troponin I: <0.03 ng/mL (ref <0.031)

## 2014-06-19 LAB — I-STAT TROPONIN, ED: Troponin i, poc: 0 ng/mL (ref 0.00–0.08)

## 2014-06-19 LAB — CREATININE, SERUM
CREATININE: 0.71 mg/dL (ref 0.50–1.10)
GFR calc Af Amer: >90 mL/min (ref >90)

## 2014-06-19 IMAGING — CR DG CHEST 2V
2 series · 2 of 2 positions shown · non-contrast
Comparison: [DATE].

CLINICAL DATA: Acute chest pain.

EXAM:
CHEST  2 VIEW

[chest pa]
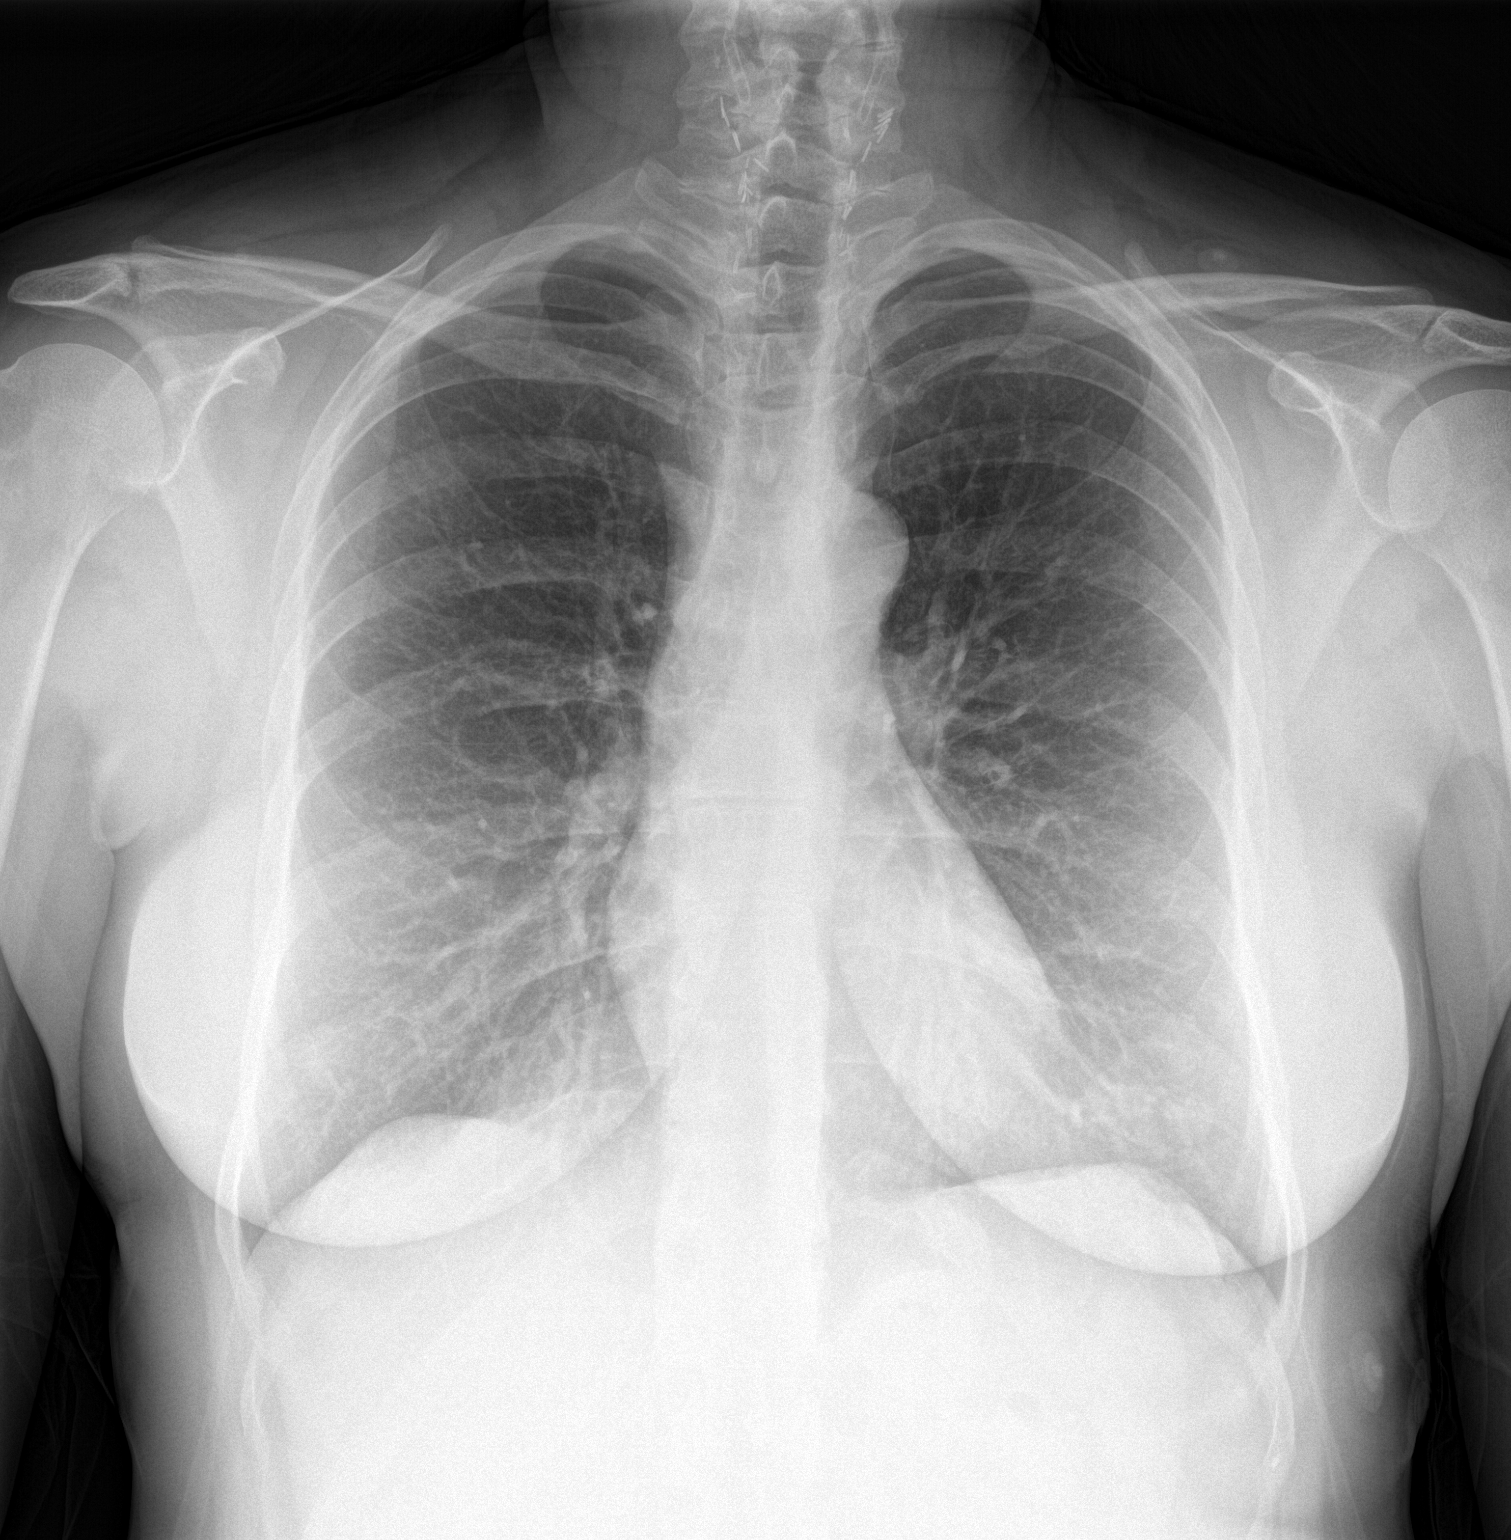

[chest lat]
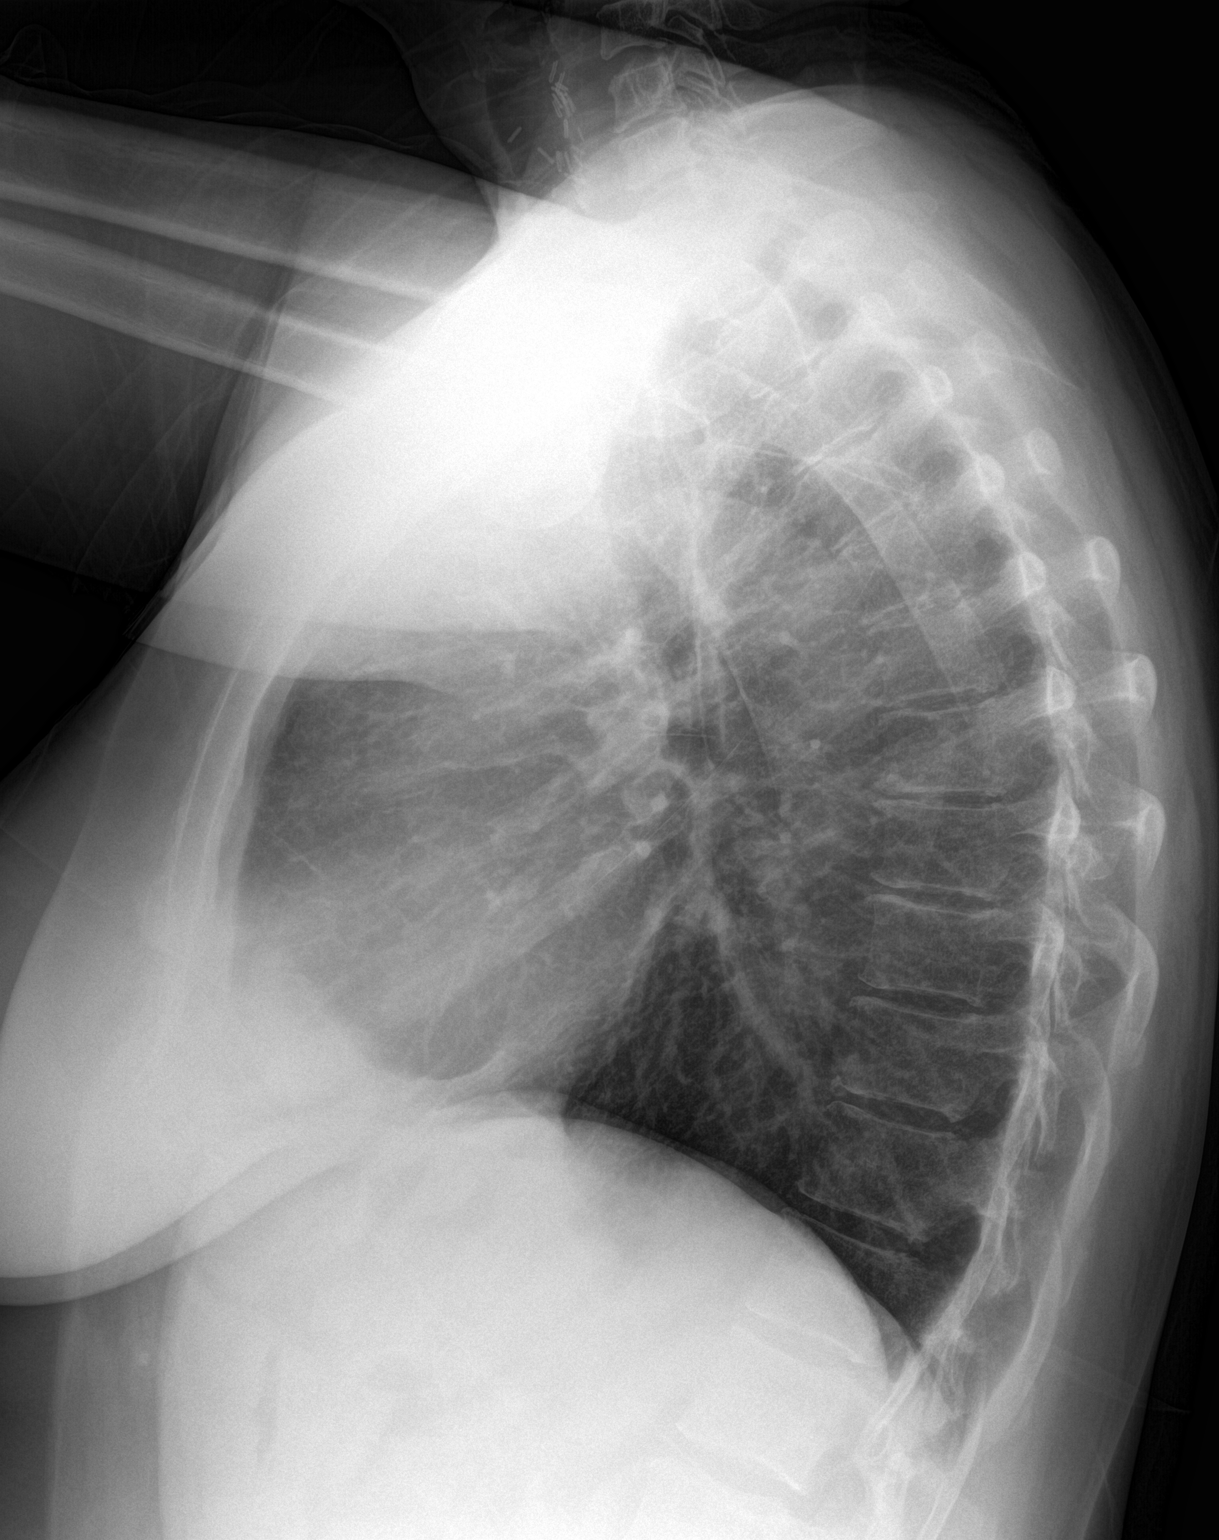

[2 of 2 positions shown; findings below may reference images not displayed]

FINDINGS: The heart size and mediastinal contours are within normal limits.
Both lungs are clear. No pneumothorax or pleural effusion is noted.
The visualized skeletal structures are unremarkable.
IMPRESSION: No active cardiopulmonary disease.

## 2014-06-19 IMAGING — CT CT ANGIO CHEST
2 of 9 series · 19 of 46 positions shown · IV contrast (OMNI)
Comparison: Chest radiographs obtained earlier today. Abdomen CT
dated [DATE].

CLINICAL DATA: Chest pain for the past 3 days after an airplane
flight.

EXAM:
CT ANGIOGRAPHY CHEST WITH CONTRAST
TECHNIQUE: Multidetector CT imaging of the chest was performed using the
standard protocol during bolus administration of intravenous
contrast. Multiplanar CT image reconstructions and MIPs were
obtained to evaluate the vascular anatomy.
CONTRAST:  100mL OMNIPAQUE IOHEXOL 350 MG/ML SOLN

[Series 5: thins · axial · 0.60mm/px · z∈[-247,+3]mm · 16 of 282 slices shown]
[im 16/282  lung]
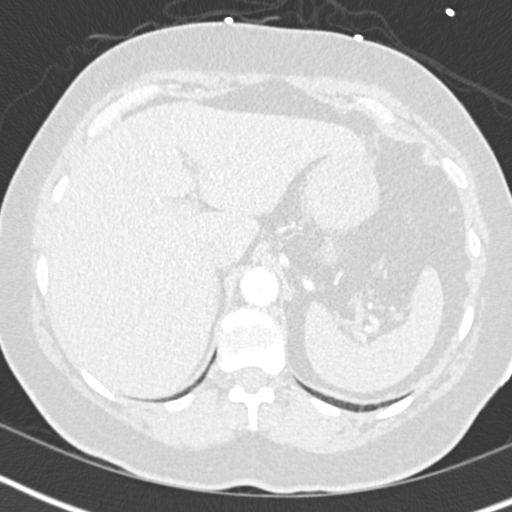
[im 32/282  soft-tissue]
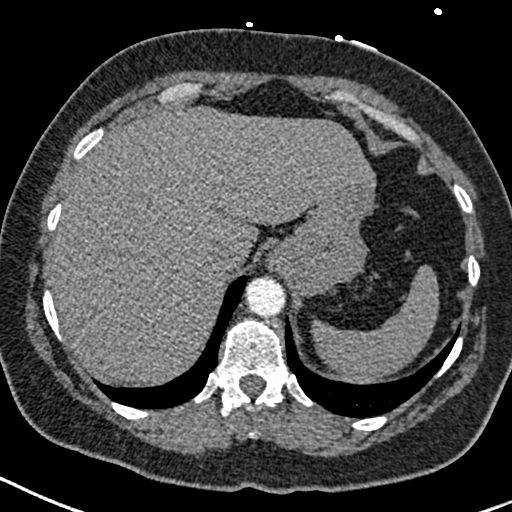
[im 47/282  lung]
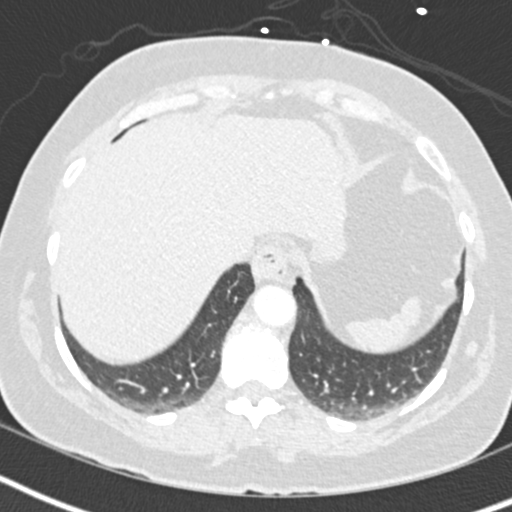
[im 63/282  soft-tissue]
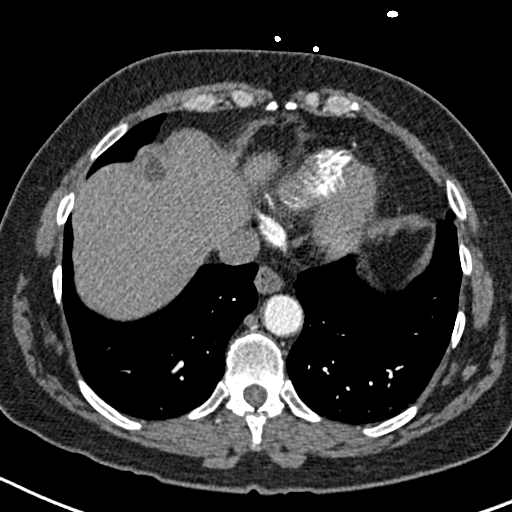
[im 79/282  lung]
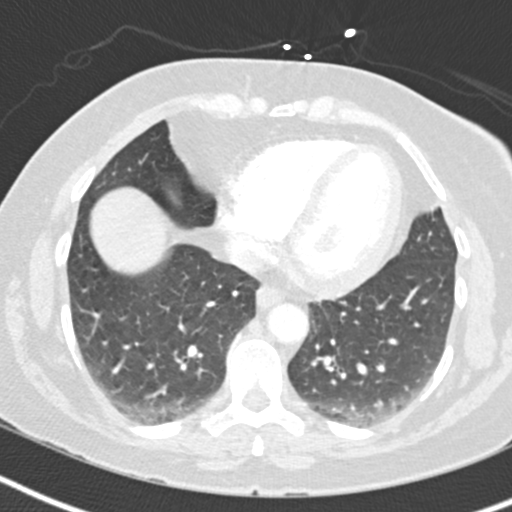
[im 94/282  soft-tissue]
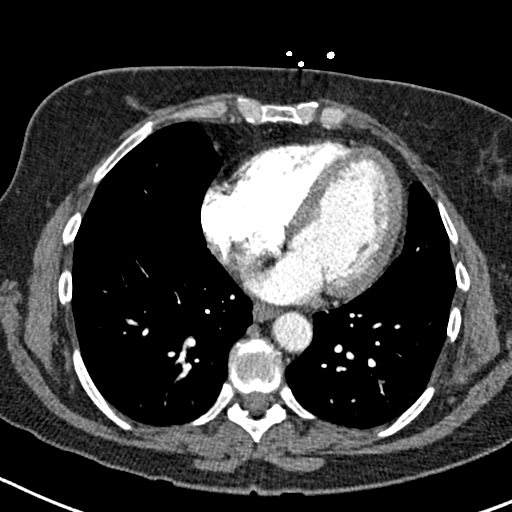
[im 110/282  lung]
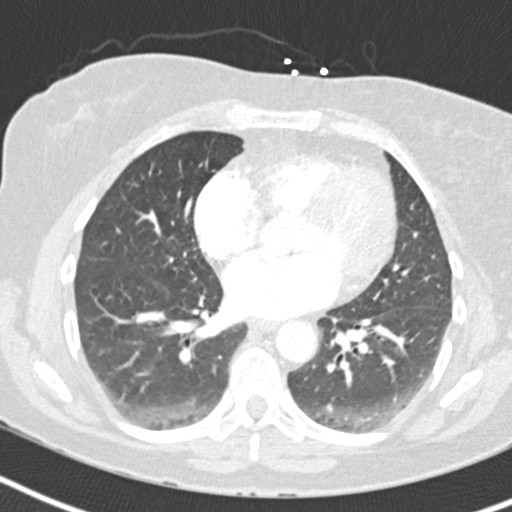
[im 125/282  soft-tissue]
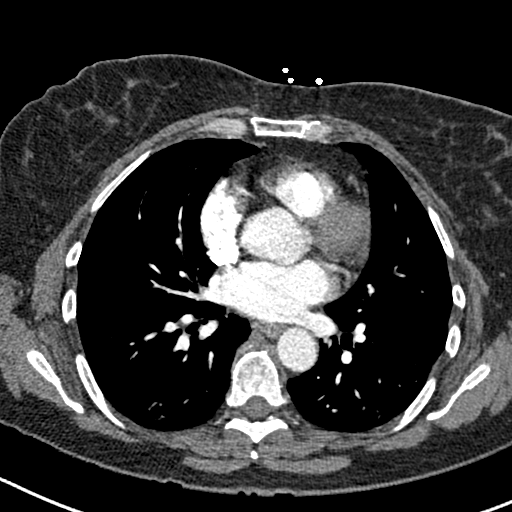
[im 157/282  lung]
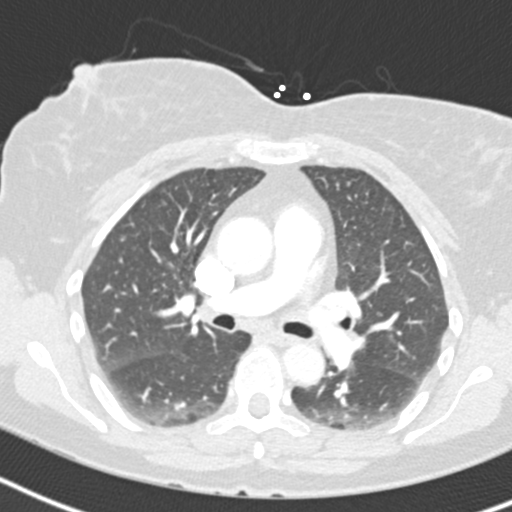
[im 172/282  soft-tissue]
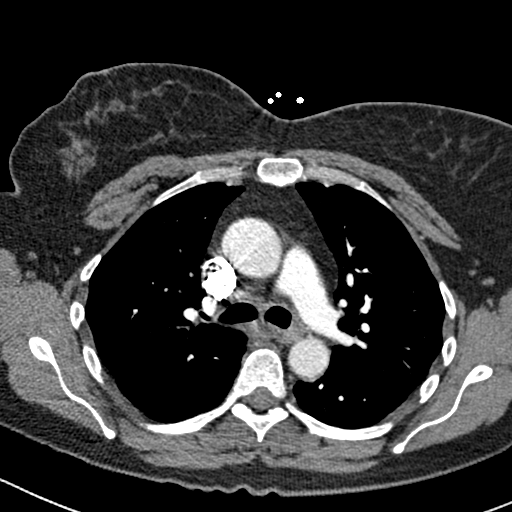
[im 188/282  lung]
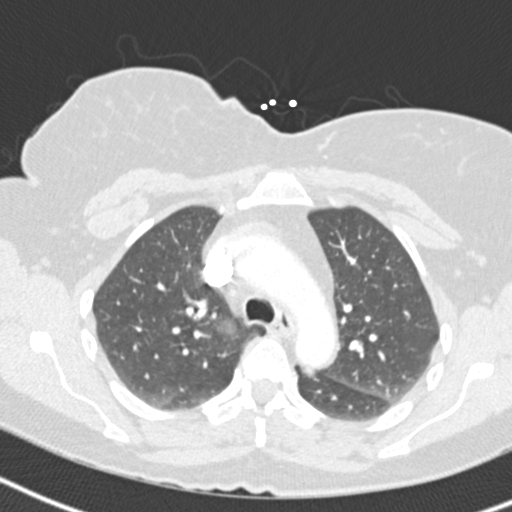
[im 203/282  soft-tissue]
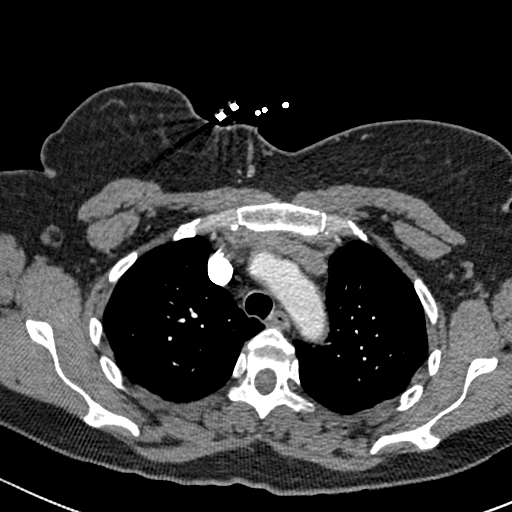
[im 219/282  lung]
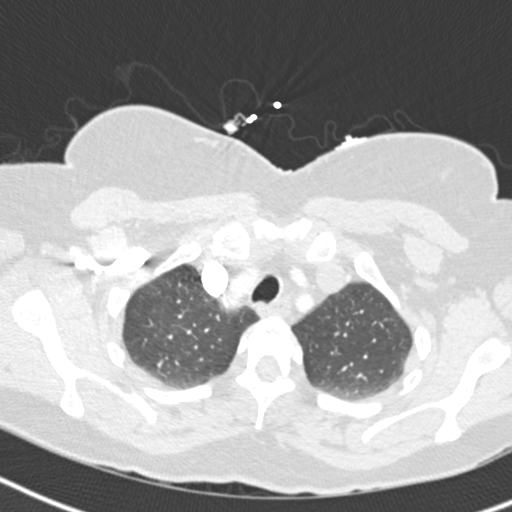
[im 235/282  soft-tissue]
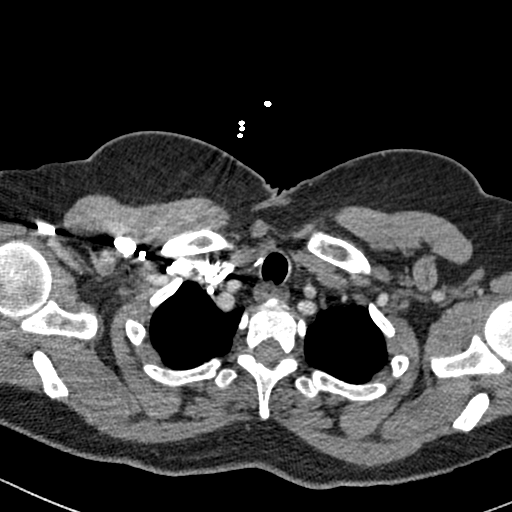
[im 250/282  lung]
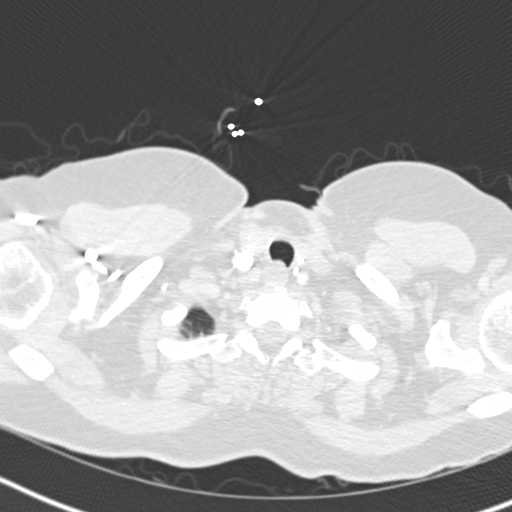
[im 266/282  soft-tissue]
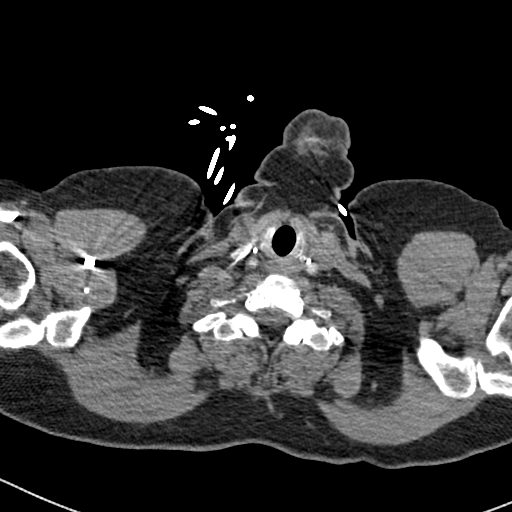

[Series 7: coronal mpr · coronal · 0.59mm/px · 3 of 113 slices shown]
[im 29/113  soft-tissue]
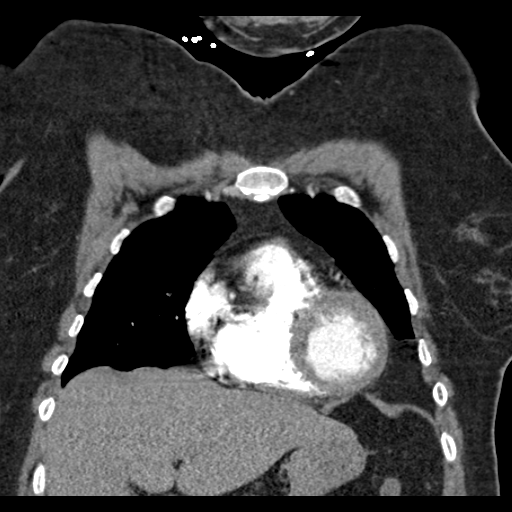
[im 57/113  soft-tissue]
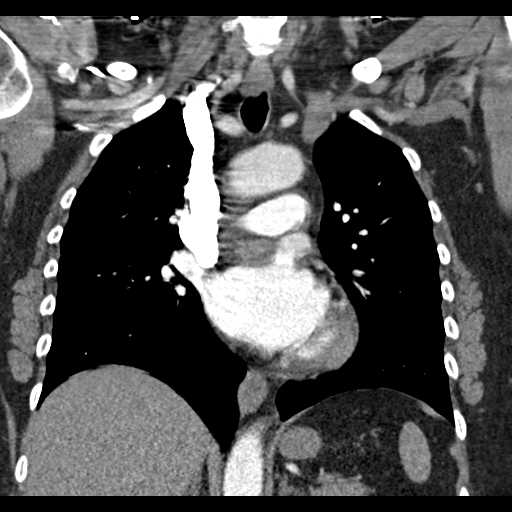
[im 85/113  soft-tissue]
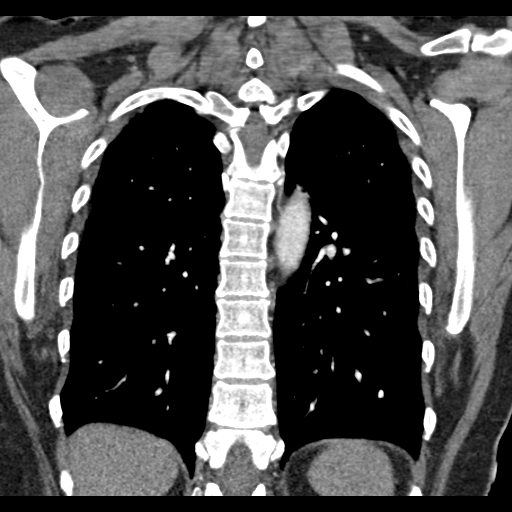

[19 of 46 positions shown; findings below may reference images not displayed]

FINDINGS: Normally opacified pulmonary arteries with no pulmonary arterial
filling defects seen. Mild bilateral lower lobe dependent
atelectasis and subpleural bullous changes. No lung nodules or
enlarged lymph nodes. Thoracic spine degenerative changes. 1.8 cm
cyst in the dome of the liver on the right.

Review of the MIP images confirms the above findings.
IMPRESSION: 1. No pulmonary emboli I am or acute abnormality.
2. Mild bilateral lower lobe dependent atelectasis and paraseptal
emphysema.
3. Previously demonstrated liver cyst on the right.

## 2014-06-19 MED ORDER — ASPIRIN 81 MG PO CHEW
324.0000 mg | CHEWABLE_TABLET | Freq: Once | ORAL | Status: AC
  Administered 2014-06-19: 324 mg via ORAL
  Filled 2014-06-19: qty 4

## 2014-06-19 MED ORDER — LEVOTHYROXINE SODIUM 125 MCG PO TABS
125.0000 ug | ORAL_TABLET | Freq: Every day | ORAL | Status: DC
  Administered 2014-06-20: 125 ug via ORAL
  Filled 2014-06-19 (×2): qty 1

## 2014-06-19 MED ORDER — LOSARTAN POTASSIUM 25 MG PO TABS
25.0000 mg | ORAL_TABLET | Freq: Every day | ORAL | Status: DC
  Administered 2014-06-19 – 2014-06-20 (×2): 25 mg via ORAL
  Filled 2014-06-19 (×3): qty 1

## 2014-06-19 MED ORDER — IOHEXOL 350 MG/ML SOLN
100.0000 mL | Freq: Once | INTRAVENOUS | Status: AC | PRN
  Administered 2014-06-19: 100 mL via INTRAVENOUS

## 2014-06-19 MED ORDER — NITROGLYCERIN 0.4 MG SL SUBL: 0.4000 mg | SUBLINGUAL_TABLET | SUBLINGUAL | Status: DC | PRN

## 2014-06-19 MED ORDER — SODIUM CHLORIDE 0.9 % IJ SOLN
3.0000 mL | Freq: Two times a day (BID) | INTRAMUSCULAR | Status: DC
  Administered 2014-06-19 – 2014-06-20 (×2): 3 mL via INTRAVENOUS

## 2014-06-19 MED ORDER — CLONAZEPAM 0.5 MG PO TABS
0.5000 mg | ORAL_TABLET | Freq: Three times a day (TID) | ORAL | Status: DC | PRN
  Administered 2014-06-19: 0.5 mg via ORAL
  Filled 2014-06-19: qty 1

## 2014-06-19 MED ORDER — PNEUMOCOCCAL VAC POLYVALENT 25 MCG/0.5ML IJ INJ
0.5000 mL | INJECTION | INTRAMUSCULAR | Status: AC
  Administered 2014-06-20: 0.5 mL via INTRAMUSCULAR
  Filled 2014-06-19: qty 0.5

## 2014-06-19 MED ORDER — NITROGLYCERIN 0.4 MG SL SUBL
0.4000 mg | SUBLINGUAL_TABLET | SUBLINGUAL | Status: DC | PRN
  Administered 2014-06-19 (×3): 0.4 mg via SUBLINGUAL
  Filled 2014-06-19 (×2): qty 1

## 2014-06-19 MED ORDER — NICOTINE 14 MG/24HR TD PT24
14.0000 mg | MEDICATED_PATCH | Freq: Every day | TRANSDERMAL | Status: DC
  Administered 2014-06-19 – 2014-06-20 (×2): 14 mg via TRANSDERMAL
  Filled 2014-06-19 (×3): qty 1

## 2014-06-19 MED ORDER — HEPARIN SODIUM (PORCINE) 5000 UNIT/ML IJ SOLN
5000.0000 [IU] | Freq: Three times a day (TID) | INTRAMUSCULAR | Status: DC
  Administered 2014-06-19 – 2014-06-20 (×3): 5000 [IU] via SUBCUTANEOUS
  Filled 2014-06-19 (×5): qty 1

## 2014-06-19 NOTE — H&P (Addendum)
Hospitalist Admission History and Physical  Patient name: Bethany [name] Medical record number: [id number] Date of birth: 19-Jun-1960 Age: 54 y.o. Gender: female  Primary Care Provider: Haywood Pao, MD  Chief Complaint: chest pain History of Present Illness:This is a 54 y.o. year old female with significant past medical history of tobacco abuse, HTN, hypothyroidism s/p thyroid resection presenting with chest pain. Patient states that she went to [place] late last week. States that she did and above average level of walking while there. Upon arrival home, patient reports recurring episodes of central chest pain with radiation to the right shoulder. Pain fairly persistent. 8-9 out of 10. No associated diaphoresis. Mild nausea.  Denies any wheezing or sputum production. No fevers or chills. Patient does report having some mild dyspnea on exertion over the past 2-3 months. Denies any orthopnea, PND. No lower extremity swelling. Has been smoking for over 30 years. No known history of obstructive lung disease. Patient with noted outpatient nuclear stress test approximately 6 years ago with Dr. Rayann Heman. Reports that she was admitted for CP at [facility] around this same time. No formal impression on nuclear stress test though imaging appears to have no noted defects. Presented to the ER afebrile, hemodynamically stable. CBC and be met within normal limits are from my blood cell count of 11.9. CT angiogram negative for acute PE. Noted emphysema. Patient given sublingual nitroglycerin as well as full dose aspirin. Marked improvement and chest pain with sublingual nitroglycerin. HEART Score: 3-4    Assessment and Plan: Bethany [name] is a 54 y.o. year old female presenting with chest pain  Active Problems:   Chest pain   1- Chest Pain  -mixed sxs in pt intermediate-moderate risk based on RFs (family hx/o early MI, HTN, tobacco) -HEART Score 3-4  -EKG and trop WNL  -CT Angio negative for PE   -cycle CEs -risk stratification labs -2d ECHO  -full dose ASA -prn NTG -quit smoking -?underlying obstructive lung disease which may be a confounder- peak flow/PFTs likely outpt   2- HTN -BP stable  -cont home regimen   3-Hypothyroidism -cont synthroid -TSH   FEN/GI: heart healthy diet  Prophylaxis: sub q heparin  Disposition: pending further evaluation  Code Status:Full Code    Patient Active Problem List   Diagnosis Date Noted  . Chest pain 06/19/2014  . Cancer   . Vitamin d deficiency   . Urinary incontinence   . SYNCOPE AND COLLAPSE 12/25/2008   Past Medical History: Past Medical History  Diagnosis Date  . Urinary incontinence     USI  . Thyroid disease   . Cancer     Thyroid cancer  . Vitamin D deficiency   . Hypertension   . Elevated cholesterol     Past Surgical History: Past Surgical History  Procedure Laterality Date  . Vaginal hysterectomy    . Thyroidectomy    . Breast surgery      Breast cyst excised    Social History: History   Social History  . Marital Status: Married    Spouse Name: N/A  . Number of Children: N/A  . Years of Education: N/A   Social History Main Topics  . Smoking status: Current Every Day Smoker -- 0.50 packs/day    Types: Cigarettes  . Smokeless tobacco: Former Systems developer    Quit date: 05/17/2012     Comment: e-cig only  . Alcohol Use: No     Comment: rare  . Drug Use: No  . Sexual Activity:  Yes    Birth Control/ Protection: Surgical   Other Topics Concern  . None   Social History Narrative    Family History: Family History  Problem Relation Age of Onset  . Hypertension Father   . Diabetes Father   . Heart disease Father   . Hypertension Brother   . Heart disease Paternal Grandfather     Allergies: Allergies  Allergen Reactions  . Other Hives    Band-Aids    Current Facility-Administered Medications  Medication Dose Route Frequency Provider Last Rate Last Dose  . clonazePAM (KLONOPIN) tablet  0.5 mg  0.5 mg Oral TID PRN Floydene Flock, MD      . heparin injection 5,000 Units  5,000 Units Subcutaneous 3 times per day Floydene Flock, MD      . Melene Muller ON 06/20/2014] levothyroxine (SYNTHROID, LEVOTHROID) tablet 125 mcg  125 mcg Oral QAC breakfast Floydene Flock, MD      . losartan (COZAAR) tablet 25 mg  25 mg Oral Daily Floydene Flock, MD      . nitroGLYCERIN (NITROSTAT) SL tablet 0.4 mg  0.4 mg Sublingual Q5 min PRN Gilda Crease, MD   0.4 mg at 06/19/14 1849  . nitroGLYCERIN (NITROSTAT) SL tablet 0.4 mg  0.4 mg Sublingual Q5 min PRN Floydene Flock, MD      . sodium chloride 0.9 % injection 3 mL  3 mL Intravenous Q12H Floydene Flock, MD       Current Outpatient Prescriptions  Medication Sig Dispense Refill  . clonazePAM (KLONOPIN) 0.5 MG tablet Take 0.5 mg by mouth 3 (three) times daily as needed. For anxiety    . diphenoxylate-atropine (LOMOTIL) 2.5-0.025 MG per tablet Take 1 tablet by mouth 4 (four) times daily as needed for diarrhea or loose stools.    . ergocalciferol (VITAMIN D2) 50000 UNITS capsule Take 50,000 Units by mouth 2 (two) times a week. On Thurs & Sun    . ibuprofen (ADVIL,MOTRIN) 200 MG tablet Take 600 mg by mouth every 6 (six) hours as needed. For pain    . levothyroxine (SYNTHROID, LEVOTHROID) 125 MCG tablet Take 125 mcg by mouth daily before breakfast.     . liothyronine (CYTOMEL) 5 MCG tablet Take 5 mcg by mouth 2 (two) times daily.  5  . losartan (COZAAR) 50 MG tablet Take 25 mg by mouth daily.     Marland Kitchen estradiol (CLIMARA - DOSED IN MG/24 HR) 0.05 mg/24hr patch Place 1 patch (0.05 mg total) onto the skin once a week. 12 patch 2   Review Of Systems: 12 point ROS negative except as noted above in HPI.  Physical Exam: Filed Vitals:   06/19/14 1854  BP: 112/67  Pulse: 82  Temp:   Resp: 23    General: alert and cooperative HEENT: PERRLA and extra ocular movement intact Heart: S1, S2 normal, no murmur, rub or gallop, regular rate and rhythm Lungs:  clear to auscultation, no wheezes or rales and unlabored breathing Abdomen: abdomen is soft without significant tenderness, masses, organomegaly or guarding Extremities: extremities normal, atraumatic, no cyanosis or edema Skin:no rashes Neurology: normal without focal findings  Labs and Imaging: Lab Results  Component Value Date/Time   NA 137 06/19/2014 02:25 PM   K 4.2 06/19/2014 02:25 PM   CL 104 06/19/2014 02:25 PM   CO2 23 06/19/2014 02:25 PM   BUN 6 06/19/2014 02:25 PM   CREATININE 0.84 06/19/2014 02:25 PM   GLUCOSE 100* 06/19/2014 02:25 PM  Lab Results  Component Value Date   WBC 11.9* 06/19/2014   HGB 14.3 06/19/2014   HCT 43.3 06/19/2014   MCV 91.4 06/19/2014   PLT 342 06/19/2014    Dg Chest 2 View  06/19/2014   CLINICAL DATA:  Acute chest pain.  EXAM: CHEST  2 VIEW  COMPARISON:  April 18, 2012.  FINDINGS: The heart size and mediastinal contours are within normal limits. Both lungs are clear. No pneumothorax or pleural effusion is noted. The visualized skeletal structures are unremarkable.  IMPRESSION: No active cardiopulmonary disease.   Electronically Signed   By: Marijo Conception, M.D.   On: 06/19/2014 15:40   Ct Angio Chest Pe W/cm &/or Wo Cm  06/19/2014   CLINICAL DATA:  Chest pain for the past 3 days after an airplane flight.  EXAM: CT ANGIOGRAPHY CHEST WITH CONTRAST  TECHNIQUE: Multidetector CT imaging of the chest was performed using the standard protocol during bolus administration of intravenous contrast. Multiplanar CT image reconstructions and MIPs were obtained to evaluate the vascular anatomy.  CONTRAST:  167mL OMNIPAQUE IOHEXOL 350 MG/ML SOLN  COMPARISON:  Chest radiographs obtained earlier today. Abdomen CT dated 04/18/2012.  FINDINGS: Normally opacified pulmonary arteries with no pulmonary arterial filling defects seen. Mild bilateral lower lobe dependent atelectasis and subpleural bullous changes. No lung nodules or enlarged lymph nodes. Thoracic spine  degenerative changes. 1.8 cm cyst in the dome of the liver on the right.  Review of the MIP images confirms the above findings.  IMPRESSION: 1. No pulmonary emboli I am or acute abnormality. 2. Mild bilateral lower lobe dependent atelectasis and paraseptal emphysema. 3. Previously demonstrated liver cyst on the right.   Electronically Signed   By: Claudie Revering M.D.   On: 06/19/2014 17:53           Shanda Howells MD  Pager: [phone]

## 2014-06-19 NOTE — ED Notes (Signed)
MD at bedside. 

## 2014-06-19 NOTE — ED Provider Notes (Signed)
CSN: [id number]     Arrival date & time 06/19/14  1418 History   First MD Initiated Contact with Patient 06/19/14 1614     Chief Complaint  Patient presents with  . Chest Pain     (Consider location/radiation/quality/duration/timing/severity/associated sxs/prior Treatment) HPI Comments: Patient presents to the ER for evaluation of chest pain. Patient reports that she has been having substernal chest pain and tightness since yesterday. She reports that she was in [place] when this occurred. She had flown to [place] and then did a lot of walking in the city. She noticed fatigue, shortness of breath and tightness in her chest with walking. She reports that symptoms were worse yesterday, but are still present today. Pain is in the center of the chest but does radiate to the right shoulder and back.  Patient reports that she did have an episode in the past where she passed out and was admitted to the hospital in [place] with elevated cardiac enzymes. She reports having had a stress test, however, that did not show any abnormality. She has not had any problems since.  Patient is a 54 y.o. female presenting with chest pain.  Chest Pain Associated symptoms: shortness of breath     Past Medical History  Diagnosis Date  . Urinary incontinence     USI  . Thyroid disease   . Cancer     Thyroid cancer  . Vitamin D deficiency   . Hypertension   . Elevated cholesterol    Past Surgical History  Procedure Laterality Date  . Vaginal hysterectomy    . Thyroidectomy    . Breast surgery      Breast cyst excised   Family History  Problem Relation Age of Onset  . Hypertension Father   . Diabetes Father   . Heart disease Father   . Hypertension Brother   . Heart disease Paternal Grandfather    History  Substance Use Topics  . Smoking status: Current Every Day Smoker -- 0.50 packs/day    Types: Cigarettes  . Smokeless tobacco: Former Systems developer    Quit date: 05/17/2012     Comment:  e-cig only  . Alcohol Use: No     Comment: rare   OB History    Gravida Para Term Preterm AB TAB SAB Ectopic Multiple Living   3 3 3       3      Review of Systems  Respiratory: Positive for shortness of breath.   Cardiovascular: Positive for chest pain.  All other systems reviewed and are negative.     Allergies  Other  Home Medications   Prior to Admission medications   Medication Sig Start Date End Date Taking? Authorizing Provider  clonazePAM (KLONOPIN) 0.5 MG tablet Take 0.5 mg by mouth 3 (three) times daily as needed. For anxiety   Yes Historical Provider, MD  diphenoxylate-atropine (LOMOTIL) 2.5-0.025 MG per tablet Take 1 tablet by mouth 4 (four) times daily as needed for diarrhea or loose stools.   Yes Historical Provider, MD  ergocalciferol (VITAMIN D2) 50000 UNITS capsule Take 50,000 Units by mouth 2 (two) times a week. On Thurs & Sun   Yes Historical Provider, MD  ibuprofen (ADVIL,MOTRIN) 200 MG tablet Take 600 mg by mouth every 6 (six) hours as needed. For pain   Yes Historical Provider, MD  levothyroxine (SYNTHROID, LEVOTHROID) 125 MCG tablet Take 125 mcg by mouth daily before breakfast.    Yes Historical Provider, MD  liothyronine (CYTOMEL) 5 MCG tablet Take 5  mcg by mouth 2 (two) times daily. 06/02/14  Yes Historical Provider, MD  losartan (COZAAR) 50 MG tablet Take 25 mg by mouth daily.    Yes Historical Provider, MD  estradiol (CLIMARA - DOSED IN MG/24 HR) 0.05 mg/24hr patch Place 1 patch (0.05 mg total) onto the skin once a week. 12/15/12   Huel Cote, NP   BP 112/67 mmHg  Pulse 82  Temp(Src) 98.7 F (37.1 C) (Oral)  Resp 23  Ht 5' 6.5" (1.689 m)  Wt 189 lb (85.73 kg)  BMI 30.05 kg/m2  SpO2 96% Physical Exam  Constitutional: She is oriented to person, place, and time. She appears well-developed and well-nourished. No distress.  HENT:  Head: Normocephalic and atraumatic.  Right Ear: Hearing normal.  Left Ear: Hearing normal.  Nose: Nose normal.   Mouth/Throat: Oropharynx is clear and moist and mucous membranes are normal.  Eyes: Conjunctivae and EOM are normal. Pupils are equal, round, and reactive to light.  Neck: Normal range of motion. Neck supple.  Cardiovascular: Regular rhythm, S1 normal and S2 normal.  Exam reveals no gallop and no friction rub.   No murmur heard. Pulmonary/Chest: Effort normal and breath sounds normal. No respiratory distress. She exhibits no tenderness.  Abdominal: Soft. Normal appearance and bowel sounds are normal. There is no hepatosplenomegaly. There is no tenderness. There is no rebound, no guarding, no tenderness at McBurney's point and negative Murphy's sign. No hernia.  Musculoskeletal: Normal range of motion.  Neurological: She is alert and oriented to person, place, and time. She has normal strength. No cranial nerve deficit or sensory deficit. Coordination normal. GCS eye subscore is 4. GCS verbal subscore is 5. GCS motor subscore is 6.  Skin: Skin is warm, dry and intact. No rash noted. No cyanosis.  Psychiatric: She has a normal mood and affect. Her speech is normal and behavior is normal. Thought content normal.  Nursing note and vitals reviewed.   ED Course  Procedures (including critical care time) Labs Review Labs Reviewed  BASIC METABOLIC PANEL - Abnormal; Notable for the following:    Glucose, Bld 100 (*)    GFR calc non Af Amer 79 (*)    All other components within normal limits  CBC WITH DIFFERENTIAL/PLATELET - Abnormal; Notable for the following:    WBC 11.9 (*)    Neutro Abs 8.1 (*)    All other components within normal limits  Randolm Idol, ED    Imaging Review Dg Chest 2 View  06/19/2014   CLINICAL DATA:  Acute chest pain.  EXAM: CHEST  2 VIEW  COMPARISON:  April 18, 2012.  FINDINGS: The heart size and mediastinal contours are within normal limits. Both lungs are clear. No pneumothorax or pleural effusion is noted. The visualized skeletal structures are unremarkable.   IMPRESSION: No active cardiopulmonary disease.   Electronically Signed   By: Marijo Conception, M.D.   On: 06/19/2014 15:40   Ct Angio Chest Pe W/cm &/or Wo Cm  06/19/2014   CLINICAL DATA:  Chest pain for the past 3 days after an airplane flight.  EXAM: CT ANGIOGRAPHY CHEST WITH CONTRAST  TECHNIQUE: Multidetector CT imaging of the chest was performed using the standard protocol during bolus administration of intravenous contrast. Multiplanar CT image reconstructions and MIPs were obtained to evaluate the vascular anatomy.  CONTRAST:  116mL OMNIPAQUE IOHEXOL 350 MG/ML SOLN  COMPARISON:  Chest radiographs obtained earlier today. Abdomen CT dated 04/18/2012.  FINDINGS: Normally opacified pulmonary arteries with no pulmonary  arterial filling defects seen. Mild bilateral lower lobe dependent atelectasis and subpleural bullous changes. No lung nodules or enlarged lymph nodes. Thoracic spine degenerative changes. 1.8 cm cyst in the dome of the liver on the right.  Review of the MIP images confirms the above findings.  IMPRESSION: 1. No pulmonary emboli I am or acute abnormality. 2. Mild bilateral lower lobe dependent atelectasis and paraseptal emphysema. 3. Previously demonstrated liver cyst on the right.   Electronically Signed   By: Claudie Revering M.D.   On: 06/19/2014 17:53     EKG Interpretation   Date/Time:  Tuesday June 19 2014 14:23:16 EDT Ventricular Rate:  87 PR Interval:  142 QRS Duration: 70 QT Interval:  372 QTC Calculation: 447 R Axis:   40 Text Interpretation:  Normal sinus rhythm Normal ECG Confirmed by Jakyrah Holladay   MD, Kaysey Berndt [phone]) on 06/19/2014 4:15:11 PM      MDM   Final diagnoses:  Chest pain   Patient presents to the ER for evaluation of chest pain. She had onset of chest pain yesterday while walking around the city of [place]. She has associated shortness of breath. Pain radiates to the right shoulder and back area. Patient reports being admitted to [facility] years ago with "elevated enzymes". She tells me that she had a cardiac stress test during that hospitalization and it was normal. She did not have a cardiac catheterization. She has not followed up with cardiology since. She had not had any pain or symptoms until yesterday.  Because she had prolonged travel and there was associated shortness of breath, PE was considered. CT angiography of chest, however, does not show any evidence of PE or other abnormality.  She is experiencing pain at rest here. She was administered aspirin and nitroglycerin. She has become pain-free after nitroglycerin. Initial troponin is negative. EKG is normal. She will, however, require further cardiac evaluation and rule out.    Orpah Greek, MD 06/19/14 [phone]

## 2014-06-19 NOTE — ED Notes (Signed)
Patient transported to CT 

## 2014-06-19 NOTE — ED Notes (Signed)
Pt c/o mid sternal CP with radiation to right shoulder; pt sts started 2 days ago; pt had recent flight to [place]; pt sts some nausea

## 2014-06-20 ENCOUNTER — Observation Stay (HOSPITAL_COMMUNITY): Payer: 59

## 2014-06-20 DIAGNOSIS — I209 Angina pectoris, unspecified: Secondary | ICD-10-CM | POA: Diagnosis not present

## 2014-06-20 DIAGNOSIS — R079 Chest pain, unspecified: Secondary | ICD-10-CM | POA: Diagnosis not present

## 2014-06-20 LAB — COMPREHENSIVE METABOLIC PANEL
ALBUMIN: 3.1 g/dL — AB (ref 3.5–5.2)
ALK PHOS: 71 U/L (ref 39–117)
ALT: 16 U/L (ref 0–35)
AST: 13 U/L (ref 0–37)
Anion gap: 10 (ref 5–15)
BUN: 8 mg/dL (ref 6–23)
CO2: 26 mmol/L (ref 19–32)
CREATININE: 0.81 mg/dL (ref 0.50–1.10)
Calcium: 8.8 mg/dL (ref 8.4–10.5)
Chloride: 103 mmol/L (ref 96–112)
GFR calc Af Amer: >90 mL/min (ref >90)
GFR calc non Af Amer: 82 mL/min — ABNORMAL LOW (ref >90)
Glucose, Bld: 88 mg/dL (ref 70–99)
POTASSIUM: 3.8 mmol/L (ref 3.5–5.1)
Sodium: 139 mmol/L (ref 135–145)
TOTAL PROTEIN: 5.6 g/dL — AB (ref 6.0–8.3)
Total Bilirubin: 0.6 mg/dL (ref 0.3–1.2)

## 2014-06-20 LAB — CBC WITH DIFFERENTIAL/PLATELET
BASOS ABS: 0 10*3/uL (ref 0.0–0.1)
BASOS PCT: 0 % (ref 0–1)
Eosinophils Absolute: 0.2 10*3/uL (ref 0.0–0.7)
Eosinophils Relative: 2 % (ref 0–5)
HCT: 39.7 % (ref 36.0–46.0)
Hemoglobin: 12.9 g/dL (ref 12.0–15.0)
LYMPHS ABS: 3.5 10*3/uL (ref 0.7–4.0)
Lymphocytes Relative: 47 % — ABNORMAL HIGH (ref 12–46)
MCH: 30.1 pg (ref 26.0–34.0)
MCHC: 32.5 g/dL (ref 30.0–36.0)
MCV: 92.5 fL (ref 78.0–100.0)
MONOS PCT: 7 % (ref 3–12)
Monocytes Absolute: 0.5 10*3/uL (ref 0.1–1.0)
NEUTROS PCT: 44 % (ref 43–77)
Neutro Abs: 3.4 10*3/uL (ref 1.7–7.7)
Platelets: 296 10*3/uL (ref 150–400)
RBC: 4.29 MIL/uL (ref 3.87–5.11)
RDW: 13.7 % (ref 11.5–15.5)
WBC: 7.6 10*3/uL (ref 4.0–10.5)

## 2014-06-20 LAB — TROPONIN I: Troponin I: <0.03 ng/mL (ref <0.031)

## 2014-06-20 LAB — TSH: TSH: 7.708 u[IU]/mL — ABNORMAL HIGH (ref 0.350–4.500)

## 2014-06-20 LAB — T4, FREE: Free T4: 0.99 ng/dL (ref 0.80–1.80)

## 2014-06-20 MED ORDER — TECHNETIUM TC 99M SESTAMIBI GENERIC - CARDIOLITE
10.0000 | Freq: Once | INTRAVENOUS | Status: AC | PRN
  Administered 2014-06-20: 10 via INTRAVENOUS

## 2014-06-20 MED ORDER — NICOTINE 14 MG/24HR TD PT24: 14.0000 mg | MEDICATED_PATCH | Freq: Every day | TRANSDERMAL | Status: DC

## 2014-06-20 MED ORDER — REGADENOSON 0.4 MG/5ML IV SOLN
INTRAVENOUS | Status: AC
  Filled 2014-06-20: qty 5

## 2014-06-20 MED ORDER — TECHNETIUM TC 99M SESTAMIBI GENERIC - CARDIOLITE
30.0000 | Freq: Once | INTRAVENOUS | Status: AC | PRN
  Administered 2014-06-20: 30 via INTRAVENOUS

## 2014-06-20 MED ORDER — LEVOTHYROXINE SODIUM 150 MCG PO TABS: 150.0000 ug | ORAL_TABLET | Freq: Every day | ORAL | Status: DC

## 2014-06-20 NOTE — Consult Note (Signed)
Reason for Consult: Chest Pain  Requesting Physician: Dr. Broadus John  HPI: Bethany [name] is a 54 year old mildly overweight married Caucasian female who works as a Photographer and was referred for evaluation of chest pain. Her cardiac risk factor profile is notable for having smoked one half pack per day of cigarettes off and on for 30 years. She has treated hypertension and family history with a father who had multiple coronary interventions. She has never had a heart attack or stroke. She was evaluated apparently 6 years ago and had a stress test that was unremarkable. She was traveling to follow-up this past weekend and did a lot of walking. She did not feel well and when she returned she noted substance sternal chest pain that lasted all day on Monday. The pain resumed on Tuesday which waxed and waned but however, it radiated to her back and right shoulder prompting her to come to the ER for further evaluation. Workup thus far has included a negative CTA for pulmonary embolism. Her EKG shows no acute changes and her enzymes are negative. Of note, her chest pain subsided after 3 sublingual nitroglycerin and has not recurred. There was no associated nausea but she did have some shortness of breath. She has also been told in the past that she has a "hiatal hernia". She still has her gallbladder intact.   Problem List: Patient Active Problem List   Diagnosis Date Noted  . Chest pain 06/19/2014  . Cancer   . Vitamin d deficiency   . Urinary incontinence   . SYNCOPE AND COLLAPSE 12/25/2008    PMHx:  Past Medical History  Diagnosis Date  . Urinary incontinence     USI  . Vitamin D deficiency   . Hypertension   . Elevated cholesterol   . Hypothyroidism   . History of hiatal hernia   . GERD (gastroesophageal reflux disease)   . Arthritis     "right foot" (06/19/2014)  . Anxiety   . Thyroid cancer     "contained in the tumors"   Past Surgical History  Procedure  Laterality Date  . Total thyroidectomy  2000's  . Breast cyst excision Right 1980's  . Vaginal hysterectomy  1990's  . Cyst excision Right ~ 2014    "between 2 toes"    FAMHx: Family History  Problem Relation Age of Onset  . Hypertension Father   . Diabetes Father   . Heart disease Father   . Hypertension Brother   . Heart disease Paternal Grandfather     SOCHx:  reports that she has been smoking Cigarettes.  She has a 15 pack-year smoking history. She has never used smokeless tobacco. She reports that she does not drink alcohol or use illicit drugs.  ALLERGIES: Allergies  Allergen Reactions  . Other Hives    Band-Aids    ROS: Pertinent items are noted in HPI.  HOME MEDICATIONS: Prescriptions prior to admission  Medication Sig Dispense Refill Last Dose  . clonazePAM (KLONOPIN) 0.5 MG tablet Take 0.5 mg by mouth 3 (three) times daily as needed. For anxiety   Past Week at Unknown time  . diphenoxylate-atropine (LOMOTIL) 2.5-0.025 MG per tablet Take 1 tablet by mouth 4 (four) times daily as needed for diarrhea or loose stools.   Past Week at Unknown time  . ergocalciferol (VITAMIN D2) 50000 UNITS capsule Take 50,000 Units by mouth 2 (two) times a week. On Thurs & Sun   Past Week at Unknown time  .  ibuprofen (ADVIL,MOTRIN) 200 MG tablet Take 600 mg by mouth every 6 (six) hours as needed. For pain   06/18/2014 at Unknown time  . levothyroxine (SYNTHROID, LEVOTHROID) 125 MCG tablet Take 125 mcg by mouth daily before breakfast.    06/18/2014 at Unknown time  . liothyronine (CYTOMEL) 5 MCG tablet Take 5 mcg by mouth 2 (two) times daily.  5 Past Week at Unknown time  . losartan (COZAAR) 50 MG tablet Take 25 mg by mouth daily.    06/18/2014 at Unknown time  . estradiol (CLIMARA - DOSED IN MG/24 HR) 0.05 mg/24hr patch Place 1 patch (0.05 mg total) onto the skin once a week. 12 patch 2     HOSPITAL MEDICATIONS: I have reviewed the patient's current medications.  VITALS: Blood pressure  103/65, pulse 73, temperature 98.2 F (36.8 C), temperature source Oral, resp. rate 18, height 5\' 6"  (1.676 m), weight 199 lb 8.3 oz (90.5 kg), SpO2 95 %.  INPUT/OUTPUT        PHYSICAL EXAM: General appearance: alert and no distress Neck: no adenopathy, no carotid bruit, no JVD, supple, symmetrical, trachea midline and thyroid not enlarged, symmetric, no tenderness/mass/nodules Lungs: clear to auscultation bilaterally Heart: regular rate and rhythm, S1, S2 normal, no murmur, click, rub or gallop Extremities: extremities normal, atraumatic, no cyanosis or edema and 2+ pedal pulses bilaterally  LABS:  BMP  Recent Labs  06/19/14 1425 06/19/14 2234 06/20/14 0436  NA 137  --  139  K 4.2  --  3.8  CL 104  --  103  CO2 23  --  26  GLUCOSE 100*  --  88  BUN 6  --  8  CREATININE 0.84 0.71 0.81  CALCIUM 9.0  --  8.8  GFRNONAA 79* >90 82*  GFRAA >90 >90 >90    CBC  Recent Labs Lab 06/20/14 0436  WBC 7.6  RBC 4.29  HGB 12.9  HCT 39.7  PLT 296  MCV 92.5    HEMOGLOBIN A1C No results found for: HGBA1C, MPG  Cardiac Panel (last 3 results)  Recent Labs  06/19/14 2234 06/20/14 0120 06/20/14 0725  TROPONINI <0.03 <0.03 <0.03    BNP (last 3 results) No results for input(s): PROBNP in the last 8760 hours.  TSH  Recent Labs  06/19/14 2234  TSH 7.708*    CHOLESTEROL  Recent Labs  06/19/14 2234  CHOL 192    Hepatic Function Panel  Recent Labs  06/20/14 0436  PROT 5.6*  ALBUMIN 3.1*  AST 13  ALT 16  ALKPHOS 71  BILITOT 0.6    IMAGING: Dg Chest 2 View  06/19/2014   CLINICAL DATA:  Acute chest pain.  EXAM: CHEST  2 VIEW  COMPARISON:  April 18, 2012.  FINDINGS: The heart size and mediastinal contours are within normal limits. Both lungs are clear. No pneumothorax or pleural effusion is noted. The visualized skeletal structures are unremarkable.  IMPRESSION: No active cardiopulmonary disease.   Electronically Signed   By: Marijo Conception, M.D.    On: 06/19/2014 15:40   Ct Angio Chest Pe W/cm &/or Wo Cm  06/19/2014   CLINICAL DATA:  Chest pain for the past 3 days after an airplane flight.  EXAM: CT ANGIOGRAPHY CHEST WITH CONTRAST  TECHNIQUE: Multidetector CT imaging of the chest was performed using the standard protocol during bolus administration of intravenous contrast. Multiplanar CT image reconstructions and MIPs were obtained to evaluate the vascular anatomy.  CONTRAST:  174mL OMNIPAQUE IOHEXOL 350 MG/ML  SOLN  COMPARISON:  Chest radiographs obtained earlier today. Abdomen CT dated 04/18/2012.  FINDINGS: Normally opacified pulmonary arteries with no pulmonary arterial filling defects seen. Mild bilateral lower lobe dependent atelectasis and subpleural bullous changes. No lung nodules or enlarged lymph nodes. Thoracic spine degenerative changes. 1.8 cm cyst in the dome of the liver on the right.  Review of the MIP images confirms the above findings.  IMPRESSION: 1. No pulmonary emboli I am or acute abnormality. 2. Mild bilateral lower lobe dependent atelectasis and paraseptal emphysema. 3. Previously demonstrated liver cyst on the right.   Electronically Signed   By: Claudie Revering M.D.   On: 06/19/2014 17:53   Tele- NSR  EKG- NSR w/o STTWC. I personally reviewed this EKG   IMPRESSION: 1. Chest pain: Mr. Paul had chest pain the left all-day Monday, off and on yesterday resolved with sublingual nitroglycerin.she does have positive cardiac risk factors and her chest pain has some atypical features. I'm somewhat splayed by the fact that it was nitrate responsive and that it radiated. 2. Hypertension: Controlled on current medications.   RECOMMENDATION: 1. Exercise Myoview stress test to risk stratify her. This will be done as an inpatient and if negative can be discharged home on anti-reflex measures. If this is positive she will require coronary angiography.  Time Spent Directly with Patient: 20 minutes  Niketa Turner J 06/20/2014, 9:11  AM

## 2014-06-20 NOTE — Progress Notes (Signed)
UR completed 

## 2014-06-20 NOTE — Progress Notes (Addendum)
[facility] PROGRESS NOTE  Jenasis Straley Huntley HAL:[id number] DOB: 1960-10-04 DOA: 06/19/2014 PCP: Haywood Pao, MD  Assessment/Plan: 1. Atypical Chest pain -relieved with NTG yetserday -EKG and cardiac enzymes normal -Will ask cards to eval-Outpatient vs inpatient Stress test -CTA negative for PE  2. HTN -stable, continue cozaar  3. Hypothryodisim -continue synthroid -TSH up, will check free T4  4. Tobacco abuse -counseled, CT with evidence of emphysema  DVT proph: Hep SQ  Code Status: Full Code Family Communication: none at bedside (indicate person spoken with, relationship, and if by phone, the number) Disposition Plan: home pending Cards eval   Consultants:  Cards pending  HPI/Subjective: No further chest pain  Objective: Filed Vitals:   06/20/14 0436  BP: 103/65  Pulse: 73  Temp: 98.2 F (36.8 C)  Resp: 18   No intake or output data in the 24 hours ending 06/20/14 0747 Filed Weights   06/19/14 1425 06/19/14 2141 06/20/14 0436  Weight: 85.73 kg (189 lb) 91.264 kg (201 lb 3.2 oz) 90.5 kg (199 lb 8.3 oz)    Exam:   General:  AAOx3  Cardiovascular: S1S2/RRR  Respiratory: CTAB  Abdomen: soft, Nt, BS present  Musculoskeletal: no edema c/c   Data Reviewed: Basic Metabolic Panel:  Recent Labs Lab 06/19/14 1425 06/19/14 2234 06/20/14 0436  NA 137  --  139  K 4.2  --  3.8  CL 104  --  103  CO2 23  --  26  GLUCOSE 100*  --  88  BUN 6  --  8  CREATININE 0.84 0.71 0.81  CALCIUM 9.0  --  8.8   Liver Function Tests:  Recent Labs Lab 06/20/14 0436  AST 13  ALT 16  ALKPHOS 71  BILITOT 0.6  PROT 5.6*  ALBUMIN 3.1*   No results for input(s): LIPASE, AMYLASE in the last 168 hours. No results for input(s): AMMONIA in the last 168 hours. CBC:  Recent Labs Lab 06/19/14 1425 06/19/14 2234 06/20/14 0436  WBC 11.9* 8.2 7.6  NEUTROABS 8.1*  --  3.4  HGB 14.3 12.7 12.9  HCT 43.3 39.4 39.7  MCV 91.4 91.0 92.5  PLT 342 306 296    Cardiac Enzymes:  Recent Labs Lab 06/19/14 2234 06/20/14 0120  TROPONINI <0.03 <0.03   BNP (last 3 results) No results for input(s): BNP in the last 8760 hours.  ProBNP (last 3 results) No results for input(s): PROBNP in the last 8760 hours.  CBG: No results for input(s): GLUCAP in the last 168 hours.  No results found for this or any previous visit (from the past 240 hour(s)).   Studies: Dg Chest 2 View  06/19/2014   CLINICAL DATA:  Acute chest pain.  EXAM: CHEST  2 VIEW  COMPARISON:  April 18, 2012.  FINDINGS: The heart size and mediastinal contours are within normal limits. Both lungs are clear. No pneumothorax or pleural effusion is noted. The visualized skeletal structures are unremarkable.  IMPRESSION: No active cardiopulmonary disease.   Electronically Signed   By: Marijo Conception, M.D.   On: 06/19/2014 15:40   Ct Angio Chest Pe W/cm &/or Wo Cm  06/19/2014   CLINICAL DATA:  Chest pain for the past 3 days after an airplane flight.  EXAM: CT ANGIOGRAPHY CHEST WITH CONTRAST  TECHNIQUE: Multidetector CT imaging of the chest was performed using the standard protocol during bolus administration of intravenous contrast. Multiplanar CT image reconstructions and MIPs were obtained to evaluate the vascular anatomy.  CONTRAST:  150mL OMNIPAQUE IOHEXOL 350 MG/ML SOLN  COMPARISON:  Chest radiographs obtained earlier today. Abdomen CT dated 04/18/2012.  FINDINGS: Normally opacified pulmonary arteries with no pulmonary arterial filling defects seen. Mild bilateral lower lobe dependent atelectasis and subpleural bullous changes. No lung nodules or enlarged lymph nodes. Thoracic spine degenerative changes. 1.8 cm cyst in the dome of the liver on the right.  Review of the MIP images confirms the above findings.  IMPRESSION: 1. No pulmonary emboli I am or acute abnormality. 2. Mild bilateral lower lobe dependent atelectasis and paraseptal emphysema. 3. Previously demonstrated liver cyst on the right.    Electronically Signed   By: Claudie Revering M.D.   On: 06/19/2014 17:53    Scheduled Meds: . heparin  5,000 Units Subcutaneous 3 times per day  . levothyroxine  125 mcg Oral QAC breakfast  . losartan  25 mg Oral Daily  . nicotine  14 mg Transdermal Daily  . pneumococcal 23 valent vaccine  0.5 mL Intramuscular Tomorrow-1000  . sodium chloride  3 mL Intravenous Q12H   Continuous Infusions:  Antibiotics Given (last 72 hours)    None      Active Problems:   Chest pain    Time spent:62min    [facility] Pager [phone]. If 7PM-7AM, please contact night-coverage at [email or web address], password [facility] 06/20/2014, 7:47 AM

## 2014-06-20 NOTE — Progress Notes (Signed)
DC IV and tele per MD orders and protocol; DC instructions reviewed with patient and spouse at bedside; no further questions from patient or family.  Rowe Pavy, RN

## 2014-06-21 LAB — HEMOGLOBIN A1C
Hgb A1c MFr Bld: 5.7 % — ABNORMAL HIGH (ref 4.8–5.6)
Mean Plasma Glucose: 117 mg/dL

## 2014-06-21 NOTE — Discharge Summary (Signed)
Physician Discharge Summary  Bethany [name] EBR:[id number] DOB: [birth year] DOA: 06/19/2014  PCP: Haywood Pao, MD  Admit date: 06/19/2014 Discharge date: 06/20/2014  Time spent: 45 minutes  Recommendations for Outpatient Follow-up:  1. PCP in 1 week, needs Pulm FU for PFTs  2. Tobacco cessation 3. Synthroid dose increased  Discharge Diagnoses:  Active Problems:   Chest pain   Tobacco use   Emphysema   Hypothryodism  Discharge Condition: stable  Diet recommendation: heart healthy  Filed Weights   06/19/14 1425 06/19/14 2141 06/20/14 0436  Weight: 85.73 kg (189 lb) 91.264 kg (201 lb 3.2 oz) 90.5 kg (199 lb 8.3 oz)    History of present illness:  Chief Complaint: chest pain History of Present Illness:This is a 54 y.o. year old female with significant past medical history of tobacco abuse, HTN, hypothyroidism s/p thyroid resection presented with chest pain. Patient stated that she went to [place] late last week, where she did a lot of of walking. Upon arrival home, reported recurring episodes of central chest pain with radiating to the right shoulder.   Hospital Course:   1. Atypical Chest pain -relieved with NTG yesterday -EKG and cardiac enzymes normal -Evaluated by Cardiology, underwent Myoview which showed No reversible ischemia or infarction, normal EF and wall motion. -CTA negative for PE  2. HTN -stable, continue cozaar  3. Hypothryodisim -continue synthroid, dose increased due to high TSH  4. Tobacco abuse -counseled, CT with evidence of emphysema  Procedures: Myoview IMPRESSION:  1. No reversible ischemia or infarction. 2. Normal left ventricular wall motion. 3. Left ventricular ejection fraction 73%  Consultations:  Cardiology  Discharge Exam: Filed Vitals:   06/20/14 1448  BP: 144/87  Pulse: 87  Temp: 97.7 F (36.5 C)  Resp: 20    General: AAOx3, no distress Cardiovascular: S1S2/RRR Respiratory: CTAB  Discharge  Instructions   Discharge Instructions    Diet - low sodium heart healthy    Complete by:  As directed      Increase activity slowly    Complete by:  As directed           Discharge Medication List as of 06/20/2014  5:24 PM    START taking these medications   Details  nicotine (NICODERM CQ - DOSED IN MG/24 HOURS) 14 mg/24hr patch Place 1 patch (14 mg total) onto the skin daily., Starting 06/20/2014, Until Discontinued, Normal      CONTINUE these medications which have CHANGED   Details  levothyroxine (SYNTHROID, LEVOTHROID) 150 MCG tablet Take 1 tablet (150 mcg total) by mouth daily before breakfast., Starting 06/20/2014, Until Discontinued, Normal      CONTINUE these medications which have NOT CHANGED   Details  clonazePAM (KLONOPIN) 0.5 MG tablet Take 0.5 mg by mouth 3 (three) times daily as needed. For anxiety, Until Discontinued, Historical Med    diphenoxylate-atropine (LOMOTIL) 2.5-0.025 MG per tablet Take 1 tablet by mouth 4 (four) times daily as needed for diarrhea or loose stools., Until Discontinued, Historical Med    ergocalciferol (VITAMIN D2) 50000 UNITS capsule Take 50,000 Units by mouth 2 (two) times a week. On Thurs & Sun, Until Discontinued, Historical Med    ibuprofen (ADVIL,MOTRIN) 200 MG tablet Take 600 mg by mouth every 6 (six) hours as needed. For pain, Until Discontinued, Historical Med    liothyronine (CYTOMEL) 5 MCG tablet Take 5 mcg by mouth 2 (two) times daily., Starting 06/02/2014, Until Discontinued, Historical Med    losartan (COZAAR) 50 MG tablet Take 25  mg by mouth daily. , Until Discontinued, Historical Med      STOP taking these medications     estradiol (CLIMARA - DOSED IN MG/24 HR) 0.05 mg/24hr patch        Allergies  Allergen Reactions  . Other Hives    Band-Aids   Follow-up Information    Follow up with Haywood Pao, MD. Schedule an appointment as soon as possible for a visit in 1 week.   Specialty:  Internal Medicine   Contact  information:   [place] [address] [phone]        The results of significant diagnostics from this hospitalization (including imaging, microbiology, ancillary and laboratory) are listed below for reference.    Significant Diagnostic Studies: Dg Chest 2 View  06/19/2014   CLINICAL DATA:  Acute chest pain.  EXAM: CHEST  2 VIEW  COMPARISON:  April 18, 2012.  FINDINGS: The heart size and mediastinal contours are within normal limits. Both lungs are clear. No pneumothorax or pleural effusion is noted. The visualized skeletal structures are unremarkable.  IMPRESSION: No active cardiopulmonary disease.   Electronically Signed   By: Marijo Conception, M.D.   On: 06/19/2014 15:40   Ct Angio Chest Pe W/cm &/or Wo Cm  06/19/2014   CLINICAL DATA:  Chest pain for the past 3 days after an airplane flight.  EXAM: CT ANGIOGRAPHY CHEST WITH CONTRAST  TECHNIQUE: Multidetector CT imaging of the chest was performed using the standard protocol during bolus administration of intravenous contrast. Multiplanar CT image reconstructions and MIPs were obtained to evaluate the vascular anatomy.  CONTRAST:  158mL OMNIPAQUE IOHEXOL 350 MG/ML SOLN  COMPARISON:  Chest radiographs obtained earlier today. Abdomen CT dated 04/18/2012.  FINDINGS: Normally opacified pulmonary arteries with no pulmonary arterial filling defects seen. Mild bilateral lower lobe dependent atelectasis and subpleural bullous changes. No lung nodules or enlarged lymph nodes. Thoracic spine degenerative changes. 1.8 cm cyst in the dome of the liver on the right.  Review of the MIP images confirms the above findings.  IMPRESSION: 1. No pulmonary emboli I am or acute abnormality. 2. Mild bilateral lower lobe dependent atelectasis and paraseptal emphysema. 3. Previously demonstrated liver cyst on the right.   Electronically Signed   By: Claudie Revering M.D.   On: 06/19/2014 17:53   Nm Myocar Multi W/spect W/wall Motion / Ef  06/20/2014    CLINICAL DATA:  Chest pain. History of hypertension and hypercholesterolemia. Initial encounter.  EXAM: MYOCARDIAL IMAGING WITH SPECT (REST AND EXERCISE)  GATED LEFT VENTRICULAR WALL MOTION STUDY  LEFT VENTRICULAR EJECTION FRACTION  TECHNIQUE: Standard myocardial SPECT imaging was performed after resting intravenous injection of 10 mCi Tc-17m sestamibi. Subsequently, exercise tolerance test was performed by the patient under the supervision of the Cardiology staff. At peak-stress, 30 mCi Tc-60m sestamibi was injected intravenously and standard myocardial SPECT imaging was performed. Quantitative gated imaging was also performed to evaluate left ventricular wall motion, and estimate left ventricular ejection fraction.  COMPARISON:  Chest CTA 06/19/2014.  FINDINGS: Perfusion: No decreased activity in the left ventricle on stress imaging to suggest reversible ischemia or infarction.  Wall Motion: Normal left ventricular wall motion. No left ventricular dilation.  Left Ventricular Ejection Fraction: 73 %  End diastolic volume 53 ml  End systolic volume 14 ml  IMPRESSION: 1. No reversible ischemia or infarction.  2. Normal left ventricular wall motion.  3. Left ventricular ejection fraction 73%  4. Low-risk stress test findings*.  *2012 Appropriate Use  Criteria for Coronary Revascularization Focused Update: J Am Coll Cardiol. 9179;15(0):569-794. http://content.airportbarriers.com.aspx?articleid=1201161   Electronically Signed   By: Richardean Sale M.D.   On: 06/20/2014 16:25    Microbiology: No results found for this or any previous visit (from the past 240 hour(s)).   Labs: Basic Metabolic Panel:  Recent Labs Lab 06/19/14 1425 06/19/14 2234 06/20/14 0436  NA 137  --  139  K 4.2  --  3.8  CL 104  --  103  CO2 23  --  26  GLUCOSE 100*  --  88  BUN 6  --  8  CREATININE 0.84 0.71 0.81  CALCIUM 9.0  --  8.8   Liver Function Tests:  Recent Labs Lab 06/20/14 0436  AST 13  ALT 16  ALKPHOS 71   BILITOT 0.6  PROT 5.6*  ALBUMIN 3.1*   No results for input(s): LIPASE, AMYLASE in the last 168 hours. No results for input(s): AMMONIA in the last 168 hours. CBC:  Recent Labs Lab 06/19/14 1425 06/19/14 2234 06/20/14 0436  WBC 11.9* 8.2 7.6  NEUTROABS 8.1*  --  3.4  HGB 14.3 12.7 12.9  HCT 43.3 39.4 39.7  MCV 91.4 91.0 92.5  PLT 342 306 296   Cardiac Enzymes:  Recent Labs Lab 06/19/14 2234 06/20/14 0120 06/20/14 0725  TROPONINI <0.03 <0.03 <0.03   BNP: BNP (last 3 results) No results for input(s): BNP in the last 8760 hours.  ProBNP (last 3 results) No results for input(s): PROBNP in the last 8760 hours.  CBG: No results for input(s): GLUCAP in the last 168 hours.     SignedDomenic Polite  [facility] 06/21/2014, 3:44 PM

## 2014-07-18 ENCOUNTER — Other Ambulatory Visit (HOSPITAL_COMMUNITY): Payer: Self-pay | Admitting: Dermatology

## 2014-09-11 ENCOUNTER — Encounter: Payer: Self-pay | Admitting: Women's Health

## 2014-09-20 ENCOUNTER — Ambulatory Visit (INDEPENDENT_AMBULATORY_CARE_PROVIDER_SITE_OTHER): Payer: 59 | Admitting: Women's Health

## 2014-09-20 ENCOUNTER — Encounter: Payer: Self-pay | Admitting: Women's Health

## 2014-09-20 VITALS — BP 120/80 | Ht 66.0 in | Wt 202.0 lb

## 2014-09-20 DIAGNOSIS — Z13820 Encounter for screening for osteoporosis: Secondary | ICD-10-CM

## 2014-09-20 DIAGNOSIS — Z79890 Hormone replacement therapy (postmenopausal): Secondary | ICD-10-CM | POA: Diagnosis not present

## 2014-09-20 DIAGNOSIS — Z01419 Encounter for gynecological examination (general) (routine) without abnormal findings: Secondary | ICD-10-CM

## 2014-09-20 MED ORDER — ESTRADIOL 0.5 MG PO TABS: 0.5000 mg | ORAL_TABLET | Freq: Every day | ORAL | Status: DC

## 2014-09-20 NOTE — Progress Notes (Signed)
Bethany [name] 02/16/1961 [id number]    History:    Presents for annual exam.  1998 TVH for menorrhagia. Reports numerous hot flushes and poor sleep. She used patches in the past with poor results, some nausea with by mouth estrogen. Having stress incontinence with cough, sneeze and running. 2004 thyroid cancer thyroidectomy on Synthroid Dr. Buddy Duty manages. Primary care manages labs and hypertension. Recently quit smoking for the second time. 01/2014 mammogram okay after ultrasound. Normal Pap history. Benign polyps on colonoscopy 2010. Walking 6 miles daily, primary care managing meds for weight loss.  \ Past medical history, past surgical history, family history and social history were all reviewed and documented in the EPIC chart. 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 3 sons, 2 grandsons one granddaughter. Father hypertension, heart disease, diabetes, lymphoma. Mother alive and well.  ROS:  A ROS was performed and pertinent positives and negatives are included.  Exam:  Filed Vitals:   09/20/14 1530  BP: 120/80    General appearance:  Normal Thyroid:  Symmetrical, normal in size, without palpable masses or nodularity. Respiratory  Auscultation:  Clear without wheezing or rhonchi Cardiovascular  Auscultation:  Regular rate, without rubs, murmurs or gallops  Edema/varicosities:  Not grossly evident Abdominal  Soft,nontender, without masses, guarding or rebound.  Liver/spleen:  No organomegaly noted  Hernia:  None appreciated  Skin  Inspection:  Grossly normal   Breasts: Examined lying and sitting.     Right: Without masses, retractions, discharge or axillary adenopathy.     Left: Without masses, retractions, discharge or axillary adenopathy. Gentitourinary   Inguinal/mons:  Normal without inguinal adenopathy  External genitalia:  Normal  BUS/Urethra/Skene's glands:  Normal  Vagina:  Normal  Cervix:  And uterus absent  Adnexa/parametria:     Rt: Without masses or tenderness.   Lt: Without  masses or tenderness.  Anus and perineum: Normal  Digital rectal exam: Normal sphincter tone without palpated masses or tenderness  Assessment/Plan:  54 y.o. MWF G3 P3  for annual exam complaint of numerous hot flashes and poor sleep.  47 TVH for menorrhagia 2004 thyroid cancer thyroidectomy Dr. Buddy Duty manages Hypertension primary care managing labs and meds  Plan: 3-D mammogram reviewed and encouraged history of dense breast. Repeat screening colonoscopy [facility] information given and reviewed instructed to call. Continue regular exercise, calcium rich diet, vitamin D 1000 daily. DEXA will schedule. Home safety, fall prevention and importance of regular weightbearing exercise reviewed. UA. HRT reviewed, options, estradiol 0.5 mg by mouth daily prescription, proper use given and reviewed slight risk for blood clots, strokes and breast cancer. Call if no relief of hot flushes.  [place], 4:36 PM 09/20/2014

## 2014-09-20 NOTE — Patient Instructions (Addendum)
Health Recommendations for Postmenopausal Women Respected and ongoing research has looked at the most common causes of death, disability, and poor quality of life in postmenopausal women. The causes include heart disease, diseases of blood vessels, diabetes, depression, cancer, and bone loss (osteoporosis). Many things can be done to help lower the chances of developing these and other common problems. CARDIOVASCULAR DISEASE Heart Disease: A heart attack is a medical emergency. Know the signs and symptoms of a heart attack. Below are things women can do to reduce their risk for heart disease.   Do not smoke. If you smoke, quit.  Aim for a healthy weight. Being overweight causes many preventable deaths. Eat a healthy and balanced diet and drink an adequate amount of liquids.  Get moving. Make a commitment to be more physically active. Aim for 30 minutes of activity on most, if not all days of the week.  Eat for heart health. Choose a diet that is low in saturated fat and cholesterol and eliminate trans fat. Include whole grains, vegetables, and fruits. Read and understand the labels on food containers before buying.  Know your numbers. Ask your caregiver to check your blood pressure, cholesterol (total, HDL, LDL, triglycerides) and blood glucose. Work with your caregiver on improving your entire clinical picture.  High blood pressure. Limit or stop your table salt intake (try salt substitute and food seasonings). Avoid salty foods and drinks. Read labels on food containers before buying. Eating well and exercising can help control high blood pressure. STROKE  Stroke is a medical emergency. Stroke may be the result of a blood clot in a blood vessel in the brain or by a brain hemorrhage (bleeding). Know the signs and symptoms of a stroke. To lower the risk of developing a stroke:  Avoid fatty foods.  Quit smoking.  Control your diabetes, blood pressure, and irregular heart rate. THROMBOPHLEBITIS  (BLOOD CLOT) OF THE LEG  Becoming overweight and leading a stationary lifestyle may also contribute to developing blood clots. Controlling your diet and exercising will help lower the risk of developing blood clots. CANCER SCREENING  Breast Cancer: Take steps to reduce your risk of breast cancer.  You should practice "breast self-awareness." This means understanding the normal appearance and feel of your breasts and should include breast self-examination. Any changes detected, no matter how small, should be reported to your caregiver.  After age 40, you should have a clinical breast exam (CBE) every year.  Starting at age 40, you should consider having a mammogram (breast X-ray) every year.  If you have a family history of breast cancer, talk to your caregiver about genetic screening.  If you are at high risk for breast cancer, talk to your caregiver about having an MRI and a mammogram every year.  Intestinal or Stomach Cancer: Tests to consider are a rectal exam, fecal occult blood, sigmoidoscopy, and colonoscopy. Women who are high risk may need to be screened at an earlier age and more often.  Cervical Cancer:  Beginning at age 30, you should have a Pap test every 3 years as long as the past 3 Pap tests have been normal.  If you have had past treatment for cervical cancer or a condition that could lead to cancer, you need Pap tests and screening for cancer for at least 20 years after your treatment.  If you had a hysterectomy for a problem that was not cancer or a condition that could lead to cancer, then you no longer need Pap tests.    If you are between ages 65 and 70, and you have had normal Pap tests going back 10 years, you no longer need Pap tests.  If Pap tests have been discontinued, risk factors (such as a new sexual partner) need to be reassessed to determine if screening should be resumed.  Some medical problems can increase the chance of getting cervical cancer. In these  cases, your caregiver may recommend more frequent screening and Pap tests.  Uterine Cancer: If you have vaginal bleeding after reaching menopause, you should notify your caregiver.  Ovarian Cancer: Other than yearly pelvic exams, there are no reliable tests available to screen for ovarian cancer at this time except for yearly pelvic exams.  Lung Cancer: Yearly chest X-rays can detect lung cancer and should be done on high risk women, such as cigarette smokers and women with chronic lung disease (emphysema).  Skin Cancer: A complete body skin exam should be done at your yearly examination. Avoid overexposure to the sun and ultraviolet light lamps. Use a strong sun block cream when in the sun. All of these things are important for lowering the risk of skin cancer. MENOPAUSE Menopause Symptoms: Hormone therapy products are effective for treating symptoms associated with menopause:  Moderate to severe hot flashes.  Night sweats.  Mood swings.  Headaches.  Tiredness.  Loss of sex drive.  Insomnia.  Other symptoms. Hormone replacement carries certain risks, especially in older women. Women who use or are thinking about using estrogen or estrogen with progestin treatments should discuss that with their caregiver. Your caregiver will help you understand the benefits and risks. The ideal dose of hormone replacement therapy is not known. The Food and Drug Administration (FDA) has concluded that hormone therapy should be used only at the lowest doses and for the shortest amount of time to reach treatment goals.  OSTEOPOROSIS Protecting Against Bone Loss and Preventing Fracture If you use hormone therapy for prevention of bone loss (osteoporosis), the risks for bone loss must outweigh the risk of the therapy. Ask your caregiver about other medications known to be safe and effective for preventing bone loss and fractures. To guard against bone loss or fractures, the following is recommended:  If  you are younger than age 50, take 1000 mg of calcium and at least 600 mg of Vitamin D per day.  If you are older than age 50 but younger than age 70, take 1200 mg of calcium and at least 600 mg of Vitamin D per day.  If you are older than age 70, take 1200 mg of calcium and at least 800 mg of Vitamin D per day. Smoking and excessive alcohol intake increases the risk of osteoporosis. Eat foods rich in calcium and vitamin D and do weight bearing exercises several times a week as your caregiver suggests. DIABETES Diabetes Mellitus: If you have type I or type 2 diabetes, you should keep your blood sugar under control with diet, exercise, and recommended medication. Avoid starchy and fatty foods, and too many sweets. Being overweight can make diabetes control more difficult. COGNITION AND MEMORY Cognition and Memory: Menopausal hormone therapy is not recommended for the prevention of cognitive disorders such as Alzheimer's disease or memory loss.  DEPRESSION  Depression may occur at any age, but it is common in elderly women. This may be because of physical, medical, social (loneliness), or financial problems and needs. If you are experiencing depression because of medical problems and control of symptoms, talk to your caregiver about this. Physical   activity and exercise may help with mood and sleep. Community and volunteer involvement may improve your sense of value and worth. If you have depression and you feel that the problem is getting worse or becoming severe, talk to your caregiver about which treatment options are best for you. ACCIDENTS  Accidents are common and can be serious in elderly woman. Prepare your house to prevent accidents. Eliminate throw rugs, place hand bars in bath, shower, and toilet areas. Avoid wearing high heeled shoes or walking on wet, snowy, and icy areas. Limit or stop driving if you have vision or hearing problems, or if you feel you are unsteady with your movements and  reflexes. HEPATITIS C Hepatitis C is a type of viral infection affecting the liver. It is spread mainly through contact with blood from an infected person. It can be treated, but if left untreated, it can lead to severe liver damage over the years. Many people who are infected do not know that the virus is in their blood. If you are a "baby-boomer", it is recommended that you have one screening test for Hepatitis C. IMMUNIZATIONS  Several immunizations are important to consider having during your senior years, including:   Tetanus, diphtheria, and pertussis booster shot.  Influenza every year before the flu season begins.  Pneumonia vaccine.  Shingles vaccine.  Others, as indicated based on your specific needs. Talk to your caregiver about these. Document Released: 04/24/2005 Document Revised: 07/17/2013 Document Reviewed: 12/19/2007 [facility] Patient Information 2015 [place], [place]. This information is not intended to replace advice given to you by your health care provider. Make sure you discuss any questions you have with your health care provider. Calorie Counting for Weight Loss Calories are energy you get from the things you eat and drink. Your body uses this energy to keep you going throughout the day. The number of calories you eat affects your weight. When you eat more calories than your body needs, your body stores the extra calories as fat. When you eat fewer calories than your body needs, your body burns fat to get the energy it needs. Calorie counting means keeping track of how many calories you eat and drink each day. If you make sure to eat fewer calories than your body needs, you should lose weight. In order for calorie counting to work, you will need to eat the number of calories that are right for you in a day to lose a healthy amount of weight per week. A healthy amount of weight to lose per week is usually 1-2 lb (0.5-0.9 kg). A dietitian can determine how many calories you  need in a day and give you suggestions on how to reach your calorie goal.  WHAT IS MY MY PLAN? My goal is to have __________ calories per day.  If I have this many calories per day, I should lose around __________ pounds per week. WHAT DO I NEED TO KNOW ABOUT CALORIE COUNTING? In order to meet your daily calorie goal, you will need to:  Find out how many calories are in each food you would like to eat. Try to do this before you eat.  Decide how much of the food you can eat.  Write down what you ate and how many calories it had. Doing this is called keeping a food log. WHERE DO I FIND CALORIE INFORMATION? The number of calories in a food can be found on a Nutrition Facts label. Note that all the information on a label is based  on a specific serving of the food. If a food does not have a Nutrition Facts label, try to look up the calories online or ask your dietitian for help. HOW DO I DECIDE HOW MUCH TO EAT? To decide how much of the food you can eat, you will need to consider both the number of calories in one serving and the size of one serving. This information can be found on the Nutrition Facts label. If a food does not have a Nutrition Facts label, look up the information online or ask your dietitian for help. Remember that calories are listed per serving. If you choose to have more than one serving of a food, you will have to multiply the calories per serving by the amount of servings you plan to eat. For example, the label on a package of bread might say that a serving size is 1 slice and that there are 90 calories in a serving. If you eat 1 slice, you will have eaten 90 calories. If you eat 2 slices, you will have eaten 180 calories. HOW DO I KEEP A FOOD LOG? After each meal, record the following information in your food log:  What you ate.  How much of it you ate.  How many calories it had.  Then, add up your calories. Keep your food log near you, such as in a small notebook in  your pocket. Another option is to use a mobile app or website. Some programs will calculate calories for you and show you how many calories you have left each time you add an item to the log. WHAT ARE SOME CALORIE COUNTING TIPS?  Use your calories on foods and drinks that will fill you up and not leave you hungry. Some examples of this include foods like nuts and nut butters, vegetables, lean proteins, and high-fiber foods (more than 5 g fiber per serving).  Eat nutritious foods and avoid empty calories. Empty calories are calories you get from foods or beverages that do not have many nutrients, such as candy and soda. It is better to have a nutritious high-calorie food (such as an avocado) than a food with few nutrients (such as a bag of chips).  Know how many calories are in the foods you eat most often. This way, you do not have to look up how many calories they have each time you eat them.  Look out for foods that may seem like low-calorie foods but are really high-calorie foods, such as baked goods, soda, and fat-free candy.  Pay attention to calories in drinks. Drinks such as sodas, specialty coffee drinks, alcohol, and juices have a lot of calories yet do not fill you up. Choose low-calorie drinks like water and diet drinks.  Focus your calorie counting efforts on higher calorie items. Logging the calories in a garden salad that contains only vegetables is less important than calculating the calories in a milk shake.  Find a way of tracking calories that works for you. Get creative. Most people who are successful find ways to keep track of how much they eat in a day, even if they do not count every calorie. WHAT ARE SOME PORTION CONTROL TIPS?  Know how many calories are in a serving. This will help you know how many servings of a certain food you can have.  Use a measuring cup to measure serving sizes. This is helpful when you start out. With time, you will be able to estimate serving  sizes for some  foods.  Take some time to put servings of different foods on your favorite plates, bowls, and cups so you know what a serving looks like.  Try not to eat straight from a bag or box. Doing this can lead to overeating. Put the amount you would like to eat in a cup or on a plate to make sure you are eating the right portion.  Use smaller plates, glasses, and bowls to prevent overeating. This is a quick and easy way to practice portion control. If your plate is smaller, less food can fit on it.  Try not to multitask while eating, such as watching TV or using your computer. If it is time to eat, sit down at a table and enjoy your food. Doing this will help you to start recognizing when you are full. It will also make you more aware of what and how much you are eating. HOW CAN I CALORIE COUNT WHEN EATING OUT?  Ask for smaller portion sizes or child-sized portions.  Consider sharing an entree and sides instead of getting your own entree.  If you get your own entree, eat only half. Ask for a box at the beginning of your meal and put the rest of your entree in it so you are not tempted to eat it.  Look for the calories on the menu. If calories are listed, choose the lower calorie options.  Choose dishes that include vegetables, fruits, whole grains, low-fat dairy products, and lean protein. Focusing on smart food choices from each of the 5 food groups can help you stay on track at restaurants.  Choose items that are boiled, broiled, grilled, or steamed.  Choose water, milk, unsweetened iced tea, or other drinks without added sugars. If you want an alcoholic beverage, choose a lower calorie option. For example, a regular margarita can have up to 700 calories and a glass of wine has around 150.  Stay away from items that are buttered, battered, fried, or served with cream sauce. Items labeled "crispy" are usually fried, unless stated otherwise.  Ask for dressings, sauces, and syrups on  the side. These are usually very high in calories, so do not eat much of them.  Watch out for salads. Many people think salads are a healthy option, but this is often not the case. Many salads come with bacon, fried chicken, lots of cheese, fried chips, and dressing. All of these items have a lot of calories. If you want a salad, choose a garden salad and ask for grilled meats or steak. Ask for the dressing on the side, or ask for olive oil and vinegar or lemon to use as dressing.  Estimate how many servings of a food you are given. For example, a serving of cooked rice is  cup or about the size of half a tennis ball or one cupcake wrapper. Knowing serving sizes will help you be aware of how much food you are eating at restaurants. The list below tells you how big or small some common portion sizes are based on everyday objects.  1 oz--4 stacked dice.  3 oz--1 deck of cards.  1 tsp--1 dice.  1 Tbsp-- a Ping-Pong ball.  2 Tbsp--1 Ping-Pong ball.   cup--1 tennis ball or 1 cupcake wrapper.  1 cup--1 baseball. Document Released: 03/02/2005 Document Revised: 07/17/2013 Document Reviewed: 01/05/2013 [facility] Patient Information 2015 [place], [place]. This information is not intended to replace advice given to you by your health care provider. Make sure you discuss any  questions you have with your health care provider.  

## 2014-09-21 LAB — URINALYSIS W MICROSCOPIC + REFLEX CULTURE
BACTERIA UA: NONE SEEN
BILIRUBIN URINE: NEGATIVE
Casts: NONE SEEN
Crystals: NONE SEEN
Glucose, UA: NEGATIVE mg/dL
HGB URINE DIPSTICK: NEGATIVE
KETONES UR: NEGATIVE mg/dL
Leukocytes, UA: NEGATIVE
NITRITE: NEGATIVE
PROTEIN: NEGATIVE mg/dL
Specific Gravity, Urine: 1.019 (ref 1.005–1.030)
UROBILINOGEN UA: 0.2 mg/dL (ref 0.0–1.0)
pH: 6.5 (ref 5.0–8.0)

## 2014-10-08 ENCOUNTER — Other Ambulatory Visit: Payer: Self-pay | Admitting: Gynecology

## 2014-10-08 DIAGNOSIS — Z13820 Encounter for screening for osteoporosis: Secondary | ICD-10-CM

## 2014-12-18 ENCOUNTER — Encounter: Payer: Self-pay | Admitting: Internal Medicine

## 2015-02-18 ENCOUNTER — Encounter: Payer: Self-pay | Admitting: Dietician

## 2015-02-18 ENCOUNTER — Encounter: Payer: 59 | Attending: Internal Medicine | Admitting: Dietician

## 2015-02-18 VITALS — Ht 66.5 in | Wt 196.0 lb

## 2015-02-18 DIAGNOSIS — E669 Obesity, unspecified: Secondary | ICD-10-CM

## 2015-02-18 DIAGNOSIS — Z713 Dietary counseling and surveillance: Secondary | ICD-10-CM | POA: Diagnosis present

## 2015-02-18 NOTE — Patient Instructions (Addendum)
Continue to walk regularly.  Great job on the changes that you have made. Be sure to have breakfast, lunch, and dinner daily.  These can be small meals but needed to work to increase the metabolism and provide you the nutrition your body needs. Be mindful when you eat.  (This may be difficult on your medication because it takes your appetite away.)  When am I satisfied/full? Be aware of what you are drinking.  Cream in 5 cups of coffee is providing more than 350 calories per day.  Consider using skim milk and stevia in coffee rather than creamer.  Ideas for small meals:  Soup and whole grain crackers  [place] Yogurt with fruit  Jerkey and raw vegetables  Boiled egg and fruit  Salad with chicken   1/2 of a wrap  Hummus with spinach wrap and veges  Hummus with raw veges or whole grain crackers or [employer] pita  Almonds with greek yogurt and fruit Include a small amount of protein with each meal.. mer and importance of regular meals to meet nutritional needs.

## 2015-02-18 NOTE — Progress Notes (Signed)
Medical Nutrition Therapy:  Appt start time: 1400 end time:  1700.   Assessment:  Primary concerns today: Patient is her alone.  She is concerned about her weight.  Hx of hypothyroidism for the past 10 years since thyroidectomy secondary to cancer.  She is currently taking Qsymia for weight loss.  This is working well but with concerns for mood changes.  It is decreasing her appetite, disrupts sleep, and concerned for dehydration.  She has lost 11 lbs in the past 7 weeks.  She has tried an injectable weight loss medication which did not work as well as Herbalist that caused GI symptoms prior to trying this current medication.  Last HgbA1C 5.7% 12/28/14. Other hx includes hyperlipidemia.  Current intake is inadequate to meet protein and other nutrient needs.  Highest adult weight 207 lbs 7 weeks ago. 196 lbs today. 140 lbs 10 years ago prior to thyroidectomy.     She lives with her husband.  She does the shopping and cooking.  She quit smoking 8 months ago and has walked 2-4 miles most days since that time.  HTN resolved after quitting smoking.  TANITA  BODY COMP RESULTS 02/18/15 196   BMI (kg/m^2) 31.2   Fat Mass (lbs) 86   Fat Free Mass (lbs) 110   Total Body Water (lbs) 80.5   Preferred Learning Style:   No preference indicated   Learning Readiness:   Ready  MEDICATIONS: see list to include Qsymia   DIETARY INTAKE: Skips meals more since starting the Qsymia. Usual eating pattern includes 1 meals and 0 snacks per day.  24-hr recall:  B ( AM): none. Have never been a breakfast eater.  Coffee with cream.  Snk ( AM): none  L ( PM): none Snk ( PM): none D ( PM): chili and cornbread OR salad with chicken OR steak with baked potato and salad Snk ( PM): none Beverages: 2 cups coffee with cream each am and 2-3 cups in the evening, water, 2-3 sodas per week, 6-8 cups flavored water daily, rare sweet tea  Usual physical activity: walks 2-4 miles 5-7 days per week  Estimated energy  needs: 1500 calories 170 g carbohydrates 112 g protein 42 g fat  Progress Towards Goal(s):  In progress.   Nutritional Diagnosis:  NB-1.1 Food and nutrition-related knowledge deficit As related to weight loss while meeting nutritional needs.  As evidenced by diet hx and patient report.    Intervention:  Nutrition counseling/education related to nutrition to meet nutritional needs and promote weight loss.  Discussed intuitive eating briefly but currently patient is on a diet pill which takes away appetite.  Discussed the amount of calories she is consuming with cream.    Continue to walk regularly.  Great job on the changes that you have made. Be sure to have breakfast, lunch, and dinner daily.  These can be small meals but needed to work to increase the metabolism and provide you the nutrition your body needs. Be mindful when you eat.  (This may be difficult on your medication because it takes your appetite away.)  When am I satisfied/full? Be aware of what you are drinking.  Cream in 5 cups of coffee is providing more than 350 calories per day.  Consider using skim milk and stevia in coffee rather than creamer.  Ideas for small meals:  Soup and whole grain crackers  [place] Yogurt with fruit  Jerkey and raw vegetables  Boiled egg and fruit  Salad with chicken  1/2 of a wrap  Hummus with spinach wrap and veges  Hummus with raw veges or whole grain crackers or [employer] pita  Almonds with greek yogurt and fruit Include a small amount of protein with each meal.. mer and importance of regular meals to meet nutritional needs.  Teaching Method Utilized:  Visual Auditory Hands on  Handouts given during visit include:  My plate Samples of Premier protein (vanilla and strawberry), Celebrate Calcium Citrate chew Barriers to learning/adherence to lifestyle change: none  Demonstrated degree of understanding via:  Teach Back   Monitoring/Evaluation:  Dietary intake, exercise, and body  weight in 1 month(s).

## 2015-02-20 ENCOUNTER — Encounter: Payer: 59 | Admitting: Internal Medicine

## 2015-03-01 ENCOUNTER — Encounter: Payer: Self-pay | Admitting: Internal Medicine

## 2015-03-01 ENCOUNTER — Ambulatory Visit (AMBULATORY_SURGERY_CENTER): Payer: Self-pay | Admitting: *Deleted

## 2015-03-01 VITALS — Ht 67.0 in | Wt 200.0 lb

## 2015-03-01 DIAGNOSIS — Z1211 Encounter for screening for malignant neoplasm of colon: Secondary | ICD-10-CM

## 2015-03-01 MED ORDER — NA SULFATE-K SULFATE-MG SULF 17.5-3.13-1.6 GM/177ML PO SOLN: 1.0000 | Freq: Once | ORAL | Status: DC

## 2015-03-01 NOTE — Progress Notes (Signed)
No egg or soy allergy known to patient  No issues with past sedation with any surgeries  or procedures, no intubation problems  diet pills with phentermine  She take qysmia -instructed off 10 days before colon  No home 02 use per patient

## 2015-03-13 ENCOUNTER — Ambulatory Visit (AMBULATORY_SURGERY_CENTER): Payer: 59 | Admitting: Internal Medicine

## 2015-03-13 ENCOUNTER — Encounter: Payer: Self-pay | Admitting: Internal Medicine

## 2015-03-13 VITALS — BP 135/84 | HR 65 | Temp 96.6°F | Resp 36 | Ht 67.0 in | Wt 200.0 lb

## 2015-03-13 DIAGNOSIS — Z1211 Encounter for screening for malignant neoplasm of colon: Secondary | ICD-10-CM

## 2015-03-13 DIAGNOSIS — D122 Benign neoplasm of ascending colon: Secondary | ICD-10-CM | POA: Diagnosis not present

## 2015-03-13 DIAGNOSIS — D125 Benign neoplasm of sigmoid colon: Secondary | ICD-10-CM | POA: Diagnosis not present

## 2015-03-13 DIAGNOSIS — D123 Benign neoplasm of transverse colon: Secondary | ICD-10-CM

## 2015-03-13 MED ORDER — SODIUM CHLORIDE 0.9 % IV SOLN: 500.0000 mL | INTRAVENOUS | Status: DC

## 2015-03-13 NOTE — Op Note (Signed)
[facility]  [address] [place], [address]   COLONOSCOPY PROCEDURE REPORT  PATIENT: Bethany, [name]  MR#: [id number] BIRTHDATE: [birth year] , 8  yrs. old GENDER: female ENDOSCOPIST: Eustace Quail, MD REFERRED [id number] Recall, PROCEDURE DATE:  03/13/2015 PROCEDURE:   Colonoscopy, screening and Colonoscopy with snare polypectomy x 5 First Screening Colonoscopy - Avg.  risk and is 50 yrs.  old or older Yes.  Prior Negative Screening - Now for repeat screening. N/A  History of Adenoma - Now for follow-up colonoscopy & has been > or = to 3 yrs.  N/A  Polyps removed today? Yes ASA CLASS:   Class II INDICATIONS:Screening for colonic neoplasia and Colorectal Neoplasm Risk Assessment for this procedure is average risk.  . Patient had a colonoscopy January 2007 for clinical reasons. Diverticulosis and hyperplastic-appearing polyps only MEDICATIONS: Propofol 400 mg IV and Monitored anesthesia care  DESCRIPTION OF PROCEDURE:   After the risks benefits and alternatives of the procedure were thoroughly explained, informed consent was obtained.  The digital rectal exam revealed no abnormalities of the rectum.   The LB [id number]  endoscope was introduced through the anus and advanced to the cecum, which was identified by both the appendix and ileocecal valve. No adverse events experienced.   The quality of the prep was excellent. (Suprep was used)  The instrument was then slowly withdrawn as the colon was fully examined. Estimated blood loss is zero unless otherwise noted in this procedure report.  COLON FINDINGS: Five polyps ranging between 3-48mm in size were found in the ascending colon, transverse colon, and sigmoid colon.  A polypectomy was performed with a cold snare.  The resection was complete, the polyp tissue was completely retrieved and sent to histology.   There was moderate diverticulosis noted in the ascending colon and left colon.   The  examination was otherwise normal.  Retroflexed views revealed internal hemorrhoids. The time to cecum = 2.3 Withdrawal time = 13.6   The scope was withdrawn and the procedure completed. COMPLICATIONS: There were no immediate complications.  ENDOSCOPIC IMPRESSION: 1.   Five polyps were found in the ascending , transverse , and sigmoid colon; polypectomy was performed with a cold snare 2.   Moderate diverticulosis was noted in the ascending colon and left colon 3.   The examination was otherwise normal  RECOMMENDATIONS: 1. Repeat Colonoscopy in 3 years (multiple polyps).  eSigned:  Eustace Quail, MD 03/13/2015 10:16 AM   cc: The Patient

## 2015-03-13 NOTE — Patient Instructions (Signed)
YOU HAD AN ENDOSCOPIC PROCEDURE TODAY AT THE [facility]:   Refer to the procedure report that was given to you for any specific questions about what was found during the examination.  If the procedure report does not answer your questions, please call your gastroenterologist to clarify.  If you requested that your care partner not be given the details of your procedure findings, then the procedure report has been included in a sealed envelope for you to review at your convenience later.  YOU SHOULD EXPECT: Some feelings of bloating in the abdomen. Passage of more gas than usual.  Walking can help get rid of the air that was put into your GI tract during the procedure and reduce the bloating. If you had a lower endoscopy (such as a colonoscopy or flexible sigmoidoscopy) you may notice spotting of blood in your stool or on the toilet paper. If you underwent a bowel prep for your procedure, you may not have a normal bowel movement for a few days.  Please Note:  You might notice some irritation and congestion in your nose or some drainage.  This is from the oxygen used during your procedure.  There is no need for concern and it should clear up in a day or so.  SYMPTOMS TO REPORT IMMEDIATELY:   Following lower endoscopy (colonoscopy or flexible sigmoidoscopy):  Excessive amounts of blood in the stool  Significant tenderness or worsening of abdominal pains  Swelling of the abdomen that is new, acute  Fever of 100F or higher    For urgent or emergent issues, a gastroenterologist can be reached at any hour by calling [phone].   DIET: Your first meal following the procedure should be a small meal and then it is ok to progress to your normal diet. Heavy or fried foods are harder to digest and may make you feel nauseous or bloated.  Likewise, meals heavy in dairy and vegetables can increase bloating.  Drink plenty of fluids but you should avoid alcoholic beverages for 24  hours.  ACTIVITY:  You should plan to take it easy for the rest of today and you should NOT DRIVE or use heavy machinery until tomorrow (because of the sedation medicines used during the test).    FOLLOW UP: Our staff will call the number listed on your records the next business day following your procedure to check on you and address any questions or concerns that you may have regarding the information given to you following your procedure. If we do not reach you, we will leave a message.  However, if you are feeling well and you are not experiencing any problems, there is no need to return our call.  We will assume that you have returned to your regular daily activities without incident.  If any biopsies were taken you will be contacted by phone or by letter within the next 1-3 weeks.  Please call us at [phone] if you have not heard about the biopsies in 3 weeks.    SIGNATURES/CONFIDENTIALITY: You and/or your care partner have signed paperwork which will be entered into your electronic medical record.  These signatures attest to the fact that that the information above on your After Visit Summary has been reviewed and is understood.  Full responsibility of the confidentiality of this discharge information lies with you and/or your care-partner.   Information on polyps and diverticulosis given to you today 

## 2015-03-13 NOTE — Progress Notes (Signed)
Called to room to assist during endoscopic procedure.  Patient ID and intended procedure confirmed with present staff. Received instructions for my participation in the procedure from the performing physician.  

## 2015-03-13 NOTE — Progress Notes (Signed)
Patient awakening,vss,report to rn 

## 2015-03-14 ENCOUNTER — Telehealth: Payer: Self-pay | Admitting: *Deleted

## 2015-03-14 NOTE — Telephone Encounter (Signed)
No answer, left message to call office if questions or concerns. 

## 2015-03-19 ENCOUNTER — Encounter: Payer: Self-pay | Admitting: Internal Medicine

## 2015-03-20 ENCOUNTER — Other Ambulatory Visit: Payer: Self-pay

## 2015-03-20 DIAGNOSIS — Z1231 Encounter for screening mammogram for malignant neoplasm of breast: Secondary | ICD-10-CM

## 2015-03-25 ENCOUNTER — Ambulatory Visit: Payer: 59 | Admitting: Dietician

## 2015-04-09 ENCOUNTER — Ambulatory Visit
Admission: RE | Admit: 2015-04-09 | Discharge: 2015-04-09 | Disposition: A | Discharge status: Home / Self Care | Payer: 59 | Source: Ambulatory Visit

## 2015-04-09 DIAGNOSIS — Z1231 Encounter for screening mammogram for malignant neoplasm of breast: Secondary | ICD-10-CM

## 2015-04-11 ENCOUNTER — Other Ambulatory Visit: Payer: Self-pay | Admitting: Gynecology

## 2015-04-11 ENCOUNTER — Telehealth: Payer: Self-pay | Admitting: Gynecology

## 2015-04-11 ENCOUNTER — Encounter: Payer: Self-pay | Admitting: Gynecology

## 2015-04-11 ENCOUNTER — Ambulatory Visit (INDEPENDENT_AMBULATORY_CARE_PROVIDER_SITE_OTHER): Payer: 59

## 2015-04-11 DIAGNOSIS — Z13820 Encounter for screening for osteoporosis: Secondary | ICD-10-CM

## 2015-04-11 DIAGNOSIS — M899 Disorder of bone, unspecified: Secondary | ICD-10-CM

## 2015-04-11 DIAGNOSIS — M8580 Other specified disorders of bone density and structure, unspecified site: Secondary | ICD-10-CM

## 2015-04-11 NOTE — Telephone Encounter (Signed)
Pt aware, order placed coming on 04/19/15 @ 2:30pm

## 2015-04-11 NOTE — Telephone Encounter (Signed)
Tell patient bone density shows just a little bit of osteopenia. I would recommend checking a baseline vitamin D level because if it is low that certainly would contribute to this. Otherwise recommend repeating bone density in 2 years

## 2015-04-19 ENCOUNTER — Other Ambulatory Visit: Payer: 59

## 2015-10-02 ENCOUNTER — Observation Stay (HOSPITAL_COMMUNITY)
Admission: EM | Admit: 2015-10-02 | Discharge: 2015-10-05 | Disposition: A | Discharge status: Home / Self Care | Payer: 59 | Attending: General Surgery | Admitting: General Surgery

## 2015-10-02 ENCOUNTER — Emergency Department (HOSPITAL_COMMUNITY): Payer: 59

## 2015-10-02 ENCOUNTER — Encounter (HOSPITAL_COMMUNITY): Payer: Self-pay | Admitting: Emergency Medicine

## 2015-10-02 DIAGNOSIS — R1031 Right lower quadrant pain: Secondary | ICD-10-CM | POA: Diagnosis present

## 2015-10-02 DIAGNOSIS — K381 Appendicular concretions: Secondary | ICD-10-CM | POA: Insufficient documentation

## 2015-10-02 DIAGNOSIS — E7800 Pure hypercholesterolemia, unspecified: Secondary | ICD-10-CM | POA: Diagnosis not present

## 2015-10-02 DIAGNOSIS — M8580 Other specified disorders of bone density and structure, unspecified site: Secondary | ICD-10-CM | POA: Insufficient documentation

## 2015-10-02 DIAGNOSIS — K219 Gastro-esophageal reflux disease without esophagitis: Secondary | ICD-10-CM | POA: Diagnosis not present

## 2015-10-02 DIAGNOSIS — Z90710 Acquired absence of both cervix and uterus: Secondary | ICD-10-CM | POA: Insufficient documentation

## 2015-10-02 DIAGNOSIS — Z800 Family history of malignant neoplasm of digestive organs: Secondary | ICD-10-CM | POA: Diagnosis not present

## 2015-10-02 DIAGNOSIS — Z87891 Personal history of nicotine dependence: Secondary | ICD-10-CM | POA: Insufficient documentation

## 2015-10-02 DIAGNOSIS — Z85850 Personal history of malignant neoplasm of thyroid: Secondary | ICD-10-CM | POA: Insufficient documentation

## 2015-10-02 DIAGNOSIS — Z85828 Personal history of other malignant neoplasm of skin: Secondary | ICD-10-CM | POA: Diagnosis not present

## 2015-10-02 DIAGNOSIS — Z833 Family history of diabetes mellitus: Secondary | ICD-10-CM | POA: Insufficient documentation

## 2015-10-02 DIAGNOSIS — E559 Vitamin D deficiency, unspecified: Secondary | ICD-10-CM | POA: Insufficient documentation

## 2015-10-02 DIAGNOSIS — K449 Diaphragmatic hernia without obstruction or gangrene: Secondary | ICD-10-CM | POA: Diagnosis not present

## 2015-10-02 DIAGNOSIS — G709 Myoneural disorder, unspecified: Secondary | ICD-10-CM | POA: Insufficient documentation

## 2015-10-02 DIAGNOSIS — K3589 Other acute appendicitis: Secondary | ICD-10-CM | POA: Diagnosis not present

## 2015-10-02 DIAGNOSIS — R32 Unspecified urinary incontinence: Secondary | ICD-10-CM | POA: Insufficient documentation

## 2015-10-02 DIAGNOSIS — F419 Anxiety disorder, unspecified: Secondary | ICD-10-CM | POA: Insufficient documentation

## 2015-10-02 DIAGNOSIS — Z91048 Other nonmedicinal substance allergy status: Secondary | ICD-10-CM | POA: Diagnosis not present

## 2015-10-02 DIAGNOSIS — I341 Nonrheumatic mitral (valve) prolapse: Secondary | ICD-10-CM | POA: Insufficient documentation

## 2015-10-02 DIAGNOSIS — Z8249 Family history of ischemic heart disease and other diseases of the circulatory system: Secondary | ICD-10-CM | POA: Diagnosis not present

## 2015-10-02 DIAGNOSIS — R011 Cardiac murmur, unspecified: Secondary | ICD-10-CM | POA: Insufficient documentation

## 2015-10-02 DIAGNOSIS — M1990 Unspecified osteoarthritis, unspecified site: Secondary | ICD-10-CM | POA: Diagnosis not present

## 2015-10-02 DIAGNOSIS — K388 Other specified diseases of appendix: Secondary | ICD-10-CM | POA: Diagnosis present

## 2015-10-02 DIAGNOSIS — Z79899 Other long term (current) drug therapy: Secondary | ICD-10-CM | POA: Diagnosis not present

## 2015-10-02 DIAGNOSIS — E890 Postprocedural hypothyroidism: Secondary | ICD-10-CM | POA: Diagnosis not present

## 2015-10-02 DIAGNOSIS — I10 Essential (primary) hypertension: Secondary | ICD-10-CM | POA: Insufficient documentation

## 2015-10-02 LAB — URINE MICROSCOPIC-ADD ON: RBC / HPF: NONE SEEN RBC/hpf (ref 0–5)

## 2015-10-02 LAB — COMPREHENSIVE METABOLIC PANEL
ALBUMIN: 4.4 g/dL (ref 3.5–5.0)
ALT: 19 U/L (ref 14–54)
AST: 18 U/L (ref 15–41)
Alkaline Phosphatase: 67 U/L (ref 38–126)
Anion gap: 6 (ref 5–15)
BUN: 10 mg/dL (ref 6–20)
CHLORIDE: 108 mmol/L (ref 101–111)
CO2: 25 mmol/L (ref 22–32)
Calcium: 9.6 mg/dL (ref 8.9–10.3)
Creatinine, Ser: 0.98 mg/dL (ref 0.44–1.00)
GFR calc Af Amer: >60 mL/min (ref >60)
GFR calc non Af Amer: >60 mL/min (ref >60)
GLUCOSE: 103 mg/dL — AB (ref 65–99)
POTASSIUM: 4 mmol/L (ref 3.5–5.1)
Sodium: 139 mmol/L (ref 135–145)
TOTAL PROTEIN: 7.2 g/dL (ref 6.5–8.1)
Total Bilirubin: 0.1 mg/dL — ABNORMAL LOW (ref 0.3–1.2)

## 2015-10-02 LAB — CBC
HEMATOCRIT: 44.7 % (ref 36.0–46.0)
HEMOGLOBIN: 14.3 g/dL (ref 12.0–15.0)
MCH: 29.5 pg (ref 26.0–34.0)
MCHC: 32 g/dL (ref 30.0–36.0)
MCV: 92.4 fL (ref 78.0–100.0)
Platelets: 335 10*3/uL (ref 150–400)
RBC: 4.84 MIL/uL (ref 3.87–5.11)
RDW: 12.4 % (ref 11.5–15.5)
WBC: 7.8 10*3/uL (ref 4.0–10.5)

## 2015-10-02 LAB — URINALYSIS, ROUTINE W REFLEX MICROSCOPIC
Bilirubin Urine: NEGATIVE
Glucose, UA: NEGATIVE mg/dL
Hgb urine dipstick: NEGATIVE
KETONES UR: 15 mg/dL — AB
NITRITE: NEGATIVE
PH: 6.5 (ref 5.0–8.0)
Protein, ur: NEGATIVE mg/dL
SPECIFIC GRAVITY, URINE: 1.02 (ref 1.005–1.030)

## 2015-10-02 LAB — RAPID URINE DRUG SCREEN, HOSP PERFORMED
Amphetamines: NOT DETECTED
Barbiturates: NOT DETECTED
Benzodiazepines: NOT DETECTED
Cocaine: NOT DETECTED
OPIATES: NOT DETECTED
TETRAHYDROCANNABINOL: NOT DETECTED

## 2015-10-02 LAB — CBG MONITORING, ED: Glucose-Capillary: 101 mg/dL — ABNORMAL HIGH (ref 65–99)

## 2015-10-02 LAB — ACETAMINOPHEN LEVEL

## 2015-10-02 LAB — LIPASE, BLOOD: LIPASE: 23 U/L (ref 11–51)

## 2015-10-02 LAB — SALICYLATE LEVEL

## 2015-10-02 LAB — ETHANOL

## 2015-10-02 IMAGING — CT CT ABD-PELV W/ CM
2 of 5 series · 10 of 46 positions shown, 11 images · IV contrast (Iodine)
Comparison: [DATE]

CLINICAL DATA: Lower abdominal pain since [DATE].

EXAM:
CT ABDOMEN AND PELVIS WITH CONTRAST
TECHNIQUE: Multidetector CT imaging of the abdomen and pelvis was performed
using the standard protocol following bolus administration of
intravenous contrast.
CONTRAST:  100mL [LT] IOPAMIDOL ([LT]) INJECTION 61%

[Series 201: routine, idose (2) · axial · 0.84mm/px · z∈[-253,+122]mm · 7 of 97 slices shown, 8 images]
[im 11/97  soft-tissue]
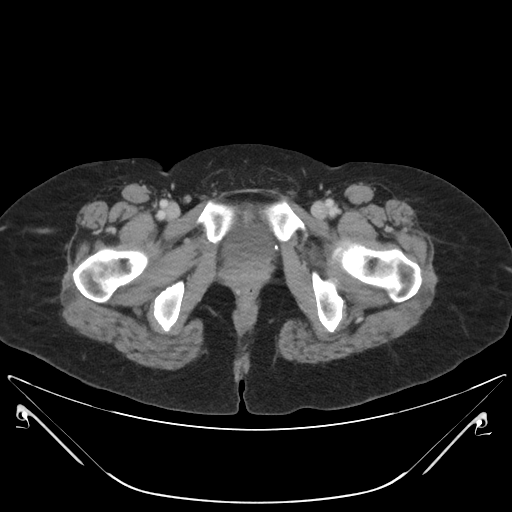
[im 11/97  bone]
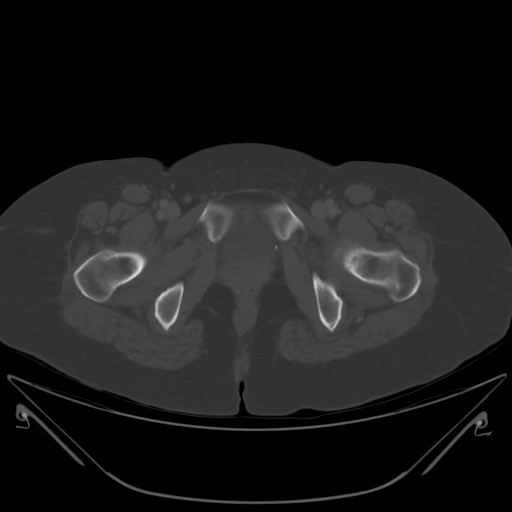
[im 21/97  soft-tissue]
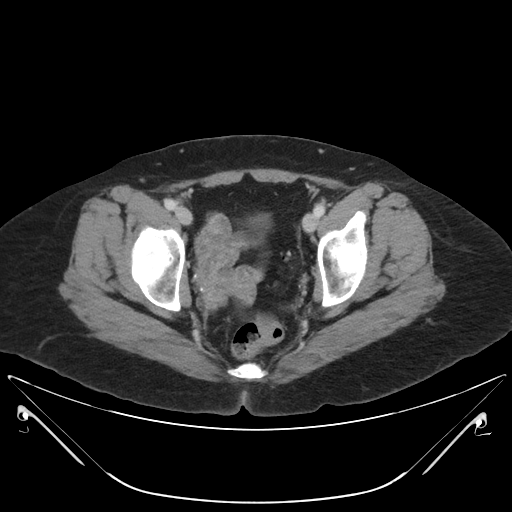
[im 36/97  soft-tissue]
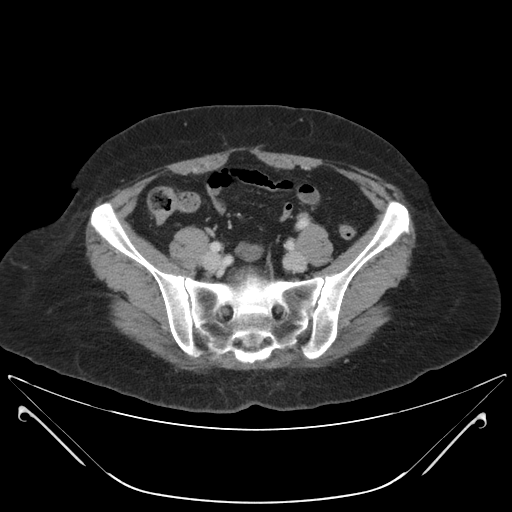
[im 51/97  soft-tissue]
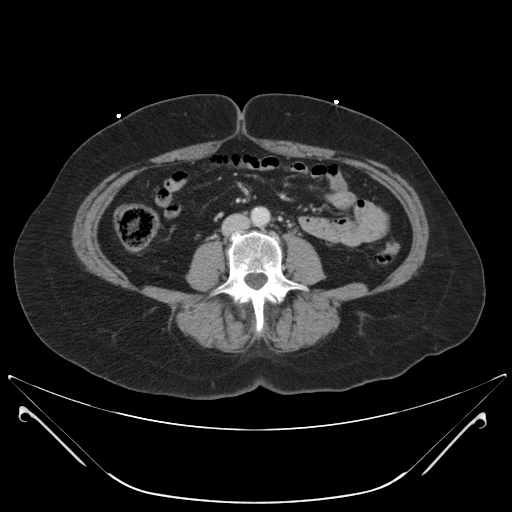
[im 61/97  soft-tissue]
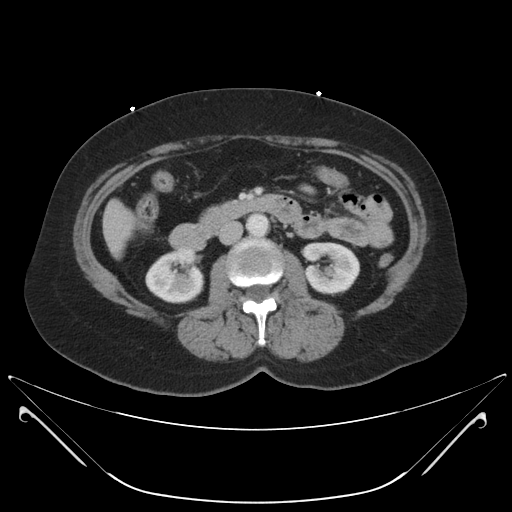
[im 76/97  soft-tissue]
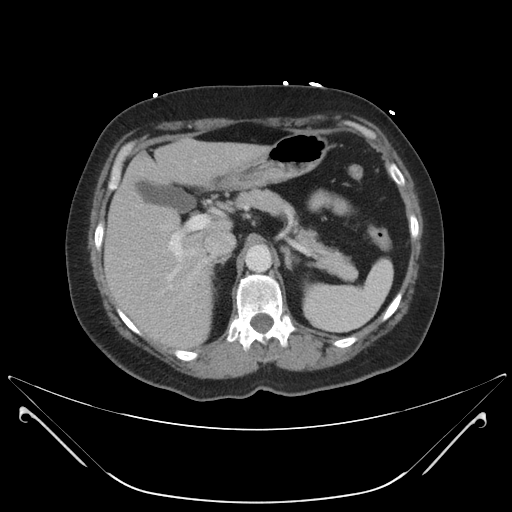
[im 86/97  soft-tissue]
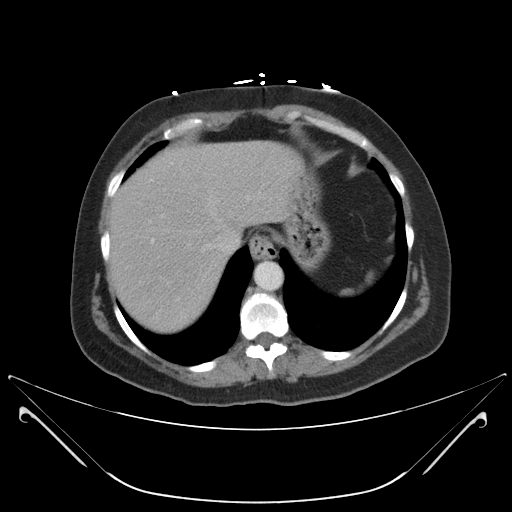

[Series 203: coronals, idose (2) · coronal · 0.45mm/px · 3 of 128 slices shown]
[im 43/128  soft-tissue]
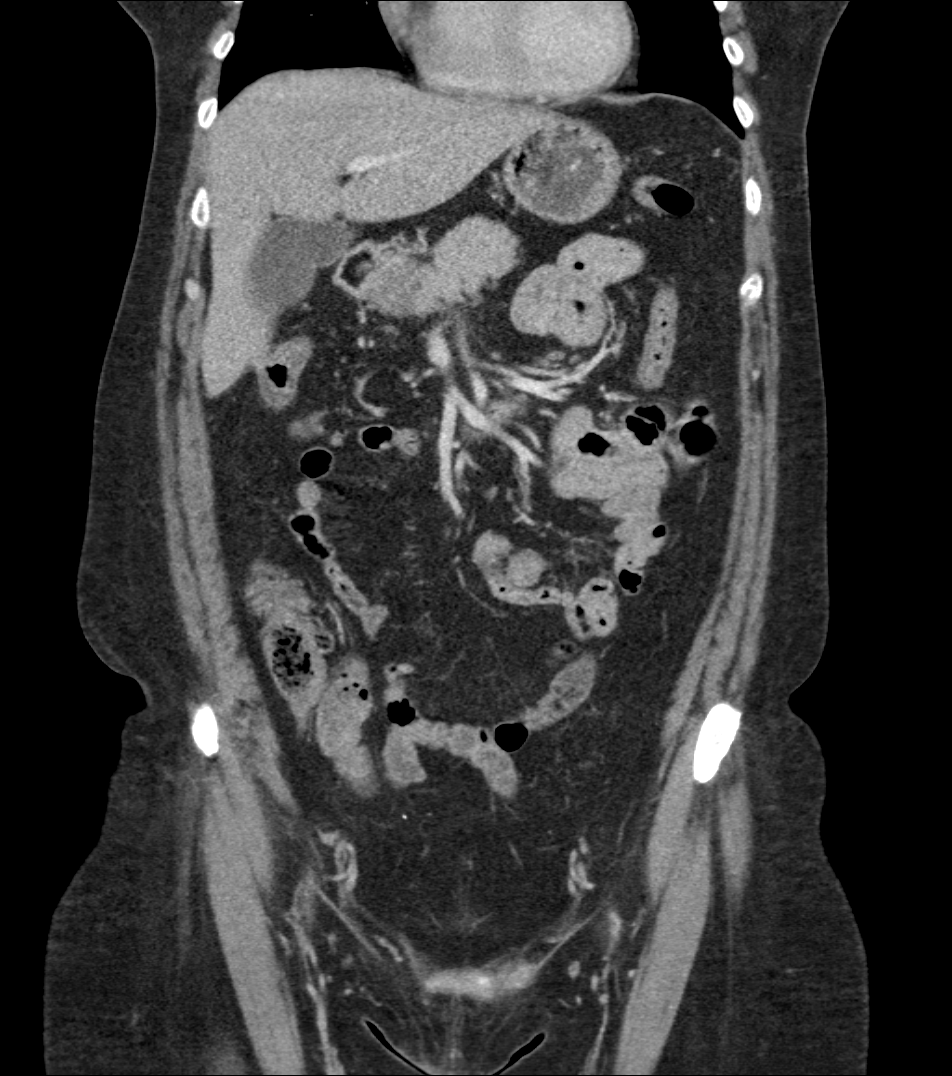
[im 57/128  soft-tissue]
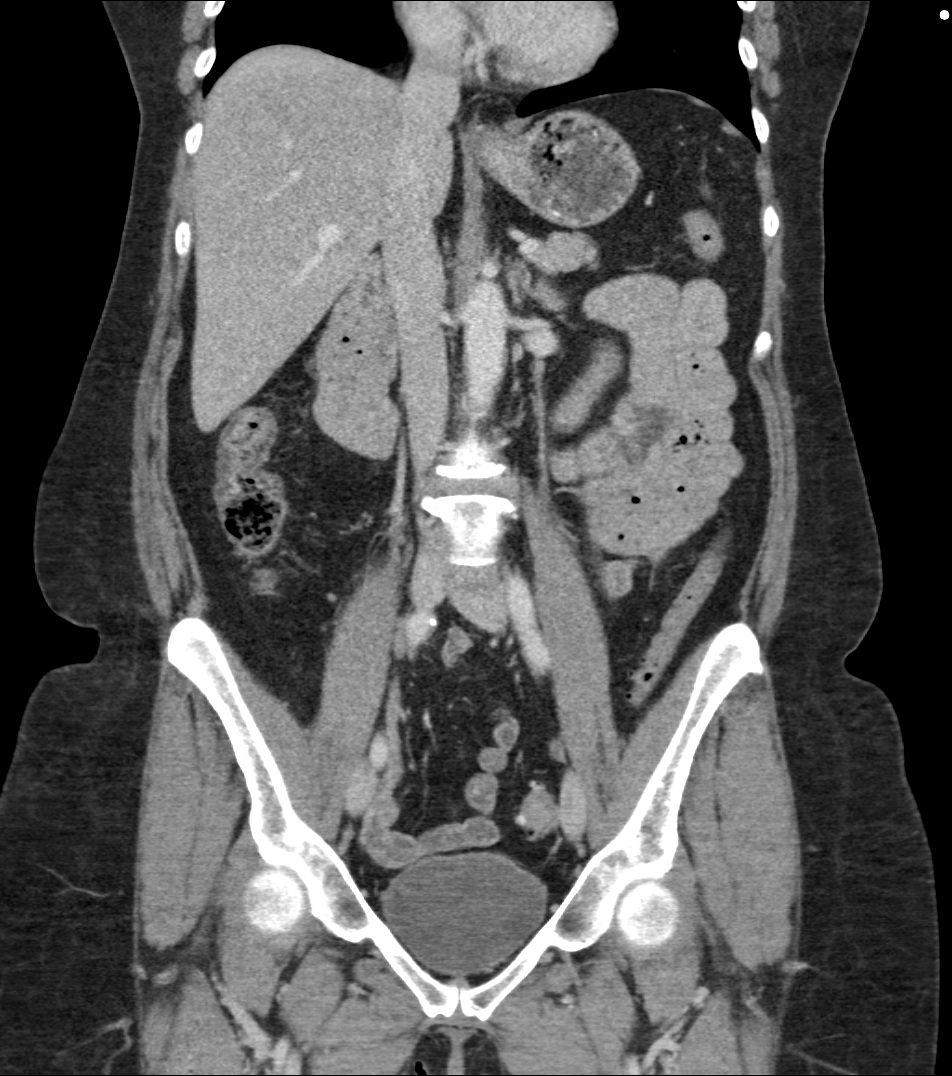
[im 71/128  soft-tissue]
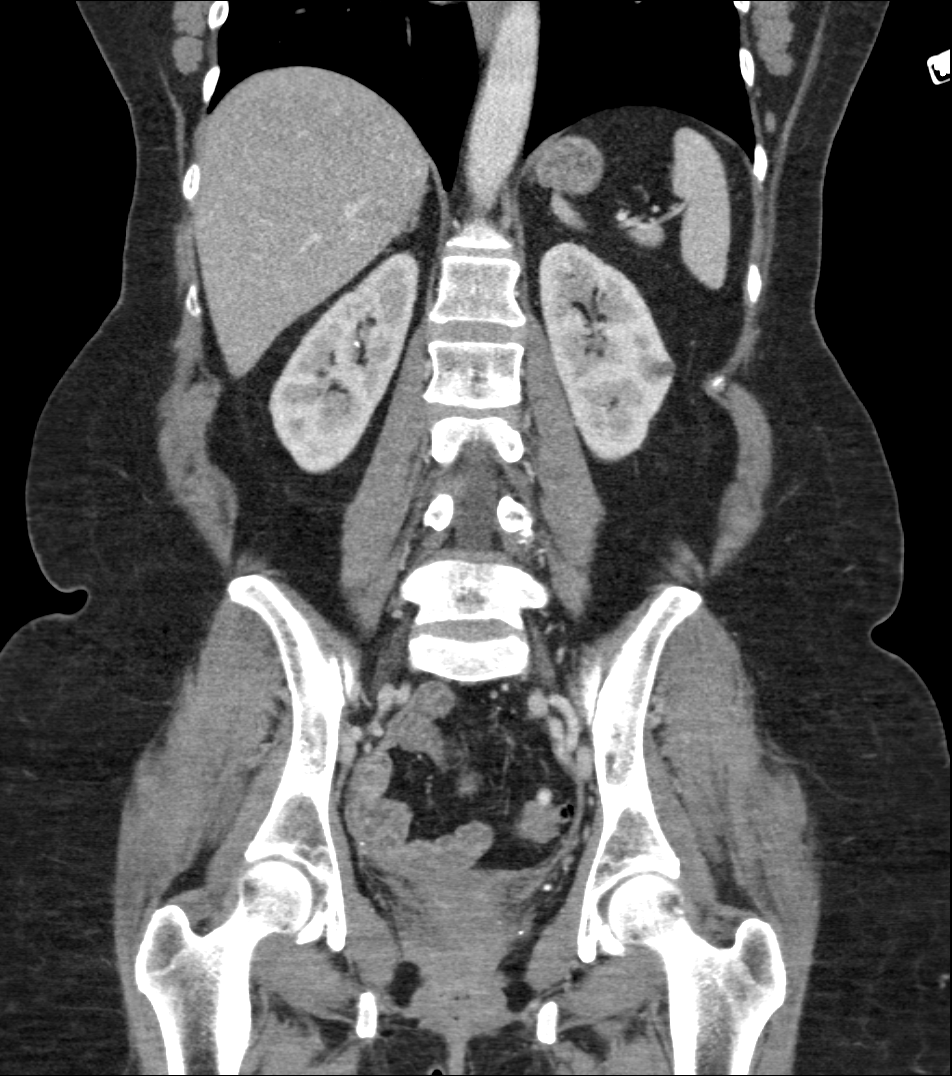

[10 of 46 positions shown; findings below may reference images not displayed]

FINDINGS: Normal lung bases.

No free air or free fluid. There is a cyst in the hepatic dome. The
liver, portal vein, gallbladder, spleen, adrenal glands, pancreas,
and right kidney are normal. There is a probable cyst in the left
kidney, too small to characterize but unchanged. The abdominal aorta
is normal in caliber with atherosclerosis identified. No adenopathy.
The stomach demonstrates a small hiatal hernia but is otherwise
normal. The small bowel is normal. The colon demonstrates
diverticulosis without diverticulitis. The the appendix is abnormal
in appearance and contains more than 1 appendicolith with 1 near the
origin. The appendix is also dilated measuring up to 15 mm. There is
no periappendiceal stranding however.

The pelvis demonstrates diverticulosis. The patient is status post
hysterectomy. The bladder is normal. No adenopathy or mass. Heart
the visualized bones demonstrate no acute abnormalities.
IMPRESSION: 1. The appendix is abnormal. It is dilated up to 14 mm and contains
at least 3 appendicoliths. However, there is no periappendiceal
stranding to suggest active inflammation. The findings are
concerning for a mucocele. Mucoceles are often surgically removed,
although not always in the acute setting.

## 2015-10-02 MED ORDER — IOPAMIDOL (ISOVUE-300) INJECTION 61%
INTRAVENOUS | Status: AC
  Administered 2015-10-02: 100 mL
  Filled 2015-10-02: qty 100

## 2015-10-02 MED ORDER — NALOXONE HCL 0.4 MG/ML IJ SOLN
0.4000 mg | INTRAMUSCULAR | Status: DC | PRN
  Administered 2015-10-02 (×2): 0.4 mg via INTRAVENOUS

## 2015-10-02 MED ORDER — NALOXONE HCL 2 MG/2ML IJ SOSY: 1.0000 mg | PREFILLED_SYRINGE | Freq: Once | INTRAMUSCULAR | Status: DC

## 2015-10-02 NOTE — ED Notes (Addendum)
Son came to nurse first and asked how much longer before pt went to a treatment room.  Explained that there was 1 more pt to go back prior to this pt.  Son went back over to parents that were sitting in waiting room and pt became unresponsive.  RN immediately over to assess pt.  Husband denies pt taking medication other than for thyroid.  Denies etoh and drug use.  Pt unresponsive to sternal rub.  Strong carotid pulse.  Respirations unlabored.  Placed on stretcher by multiple staff members and taken straight to trauma room. Dr. Winfred Leeds immediately to bedside.

## 2015-10-02 NOTE — ED Notes (Signed)
Pt. reports mid abdominal pain with nausea onset Saturday , denies emesis or diarrhea .

## 2015-10-02 NOTE — ED Provider Notes (Signed)
Level V caveat unresponsive. History is obtained from patient's husband who accompanied her Patient complained of diffuse abdominal for approximately the past 3 days. While in the waiting room she became unresponsive. She was rest immediately back upon the. On arrival to room I found the patient responsive to an noxious stimulus with nonpurposeful movement. Narcan 0.8 mg intravenously was administered and she became alert Glasgow Coma Score 15 within 2 minutes. She complains of diffuse abdominal pain presently. No other associated symptoms. Her husband reports she has not taken any medications prior to coming here. Patient denies taking pain medication prior to coming here no other associated symptoms.  Orlie Dakin, MD 10/02/15 2348

## 2015-10-02 NOTE — ED Provider Notes (Signed)
CSN: [id number]     Arrival date & time 10/02/15  1943 History   First MD Initiated Contact with Patient 10/02/15 2123     Chief Complaint  Patient presents with  . Abdominal Pain   (Consider location/radiation/quality/duration/timing/severity/associated sxs/prior Treatment) Patient is a 55 y.o. female presenting with abdominal pain. The history is provided by the patient.  Abdominal Pain Pain location:  LLQ, RLQ and suprapubic Pain quality: aching   Pain radiates to:  Does not radiate Pain severity:  Moderate Onset quality:  Gradual Duration:  3 days Timing:  Constant Progression:  Worsening Chronicity:  New Associated symptoms: nausea   Associated symptoms: no chest pain, no chills, no cough, no diarrhea, no dysuria, no fever, no shortness of breath, no sore throat and no vomiting     Past Medical History  Diagnosis Date  . Urinary incontinence     USI  . Vitamin D deficiency   . Hypertension   . Elevated cholesterol   . Hypothyroidism   . History of hiatal hernia   . GERD (gastroesophageal reflux disease)   . Arthritis     "right foot" (06/19/2014)  . Anxiety   . 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 Neuromuscular disorder ([place])   . Heart murmur   . MVP (mitral valve prolapse)   . Osteopenia 03/2015    T score -1.4 FRAX 4.3%/0.2%   Past Surgical History  Procedure Laterality Date  . Total thyroidectomy  2000's  . Breast cyst excision Right 1980's  . Vaginal hysterectomy  1990's  . Cyst excision Right ~ 2014    "between 2 toes"  . Nose surgery  2016    CANCER REMOVED   Family History  Problem Relation Age of Onset  . Hypertension Father   . Diabetes Father   . Heart disease Father   . Hypertension Brother   . Heart disease Paternal Grandfather   . Colon cancer Other     great maternal aunt   . Colon polyps Mother   . Esophageal cancer Neg Hx   . Rectal cancer Neg Hx   . Stomach cancer Neg Hx    Social History  Substance Use Topics   . Smoking status: Former Smoker -- 0.00 packs/day for 30 years    Types: Cigarettes  . Smokeless tobacco: Never Used  . Alcohol Use: Yes   OB History    Gravida Para Term Preterm AB TAB SAB Ectopic Multiple Living   3 3 3       3      Review of Systems  Constitutional: Negative for fever and chills.  HENT: Negative for congestion and sore throat.   Eyes: Negative for pain.  Respiratory: Negative for cough and shortness of breath.   Cardiovascular: Negative for chest pain and palpitations.  Gastrointestinal: Positive for nausea and abdominal pain. Negative for vomiting and diarrhea.  Genitourinary: Negative for dysuria and flank pain.  Musculoskeletal: Negative for back pain and neck pain.  Skin: Negative for rash.  Allergic/Immunologic: Negative.   Neurological: Negative for dizziness and light-headedness.  Psychiatric/Behavioral: Negative for confusion.    Allergies  Tape and Other  Home Medications   Prior to Admission medications   Medication Sig Start Date End Date Taking? Authorizing Provider  acetaminophen (TYLENOL) 500 MG tablet Take 500-1,000 mg by mouth every 6 (six) hours as needed for mild pain or moderate pain.   Yes Historical Provider, MD  clonazePAM (KLONOPIN) 0.5 MG tablet Take  0.5 mg by mouth 3 (three) times daily as needed. For anxiety   Yes Historical Provider, MD  ergocalciferol (VITAMIN D2) 50000 UNITS capsule Take 50,000 Units by mouth 2 (two) times a week. On Thurs & Sun   Yes Historical Provider, MD  ibuprofen (ADVIL,MOTRIN) 200 MG tablet Take 600 mg by mouth every 6 (six) hours as needed. For pain   Yes Historical Provider, MD  levothyroxine (SYNTHROID, LEVOTHROID) 137 MCG tablet Take 137 mcg by mouth at bedtime.   Yes Historical Provider, MD  QSYMIA 11.25-69 MG CP24 Take 1 capsule by mouth daily. 08/13/15  Yes Historical Provider, MD  ACZONE 5 % topical gel As directed 10/02/15   Historical Provider, MD  estradiol (ESTRACE) 0.5 MG tablet Take 1 tablet  (0.5 mg total) by mouth daily. Patient not taking: Reported on 10/02/2015 09/20/14   Huel Cote, NP  valACYclovir (VALTREX) 1000 MG tablet As directed (#4 filled) 10/02/15   Historical Provider, MD   BP 158/84 mmHg  Pulse 73  Temp(Src) 97.8 F (36.6 C) (Oral)  Resp 18  Ht 5\' 7"  (1.702 m)  Wt 84.539 kg  BMI 29.18 kg/m2  SpO2 99% Physical Exam  Constitutional: She is oriented to person, place, and time. She appears well-developed and well-nourished. No distress.  HENT:  Head: Normocephalic and atraumatic.  Eyes: Conjunctivae and EOM are normal. Pupils are equal, round, and reactive to light.  Neck: Normal range of motion. Neck supple.  Cardiovascular: Normal rate, regular rhythm and normal heart sounds.   Pulmonary/Chest: Effort normal and breath sounds normal. No respiratory distress.  Abdominal: Soft. Bowel sounds are normal. There is tenderness in the right lower quadrant, suprapubic area and left lower quadrant. There is tenderness at McBurney's point. There is no rigidity, no rebound, no guarding, no CVA tenderness and negative Murphy's sign.  Musculoskeletal: Normal range of motion.  Neurological: She is alert and oriented to person, place, and time. She has normal reflexes. No cranial nerve deficit.  Skin: Skin is warm and dry. She is not diaphoretic.  Psychiatric: She has a normal mood and affect.    ED Course  Procedures (including critical care time) Labs Review Labs Reviewed  COMPREHENSIVE METABOLIC PANEL - Abnormal; Notable for the following:    Glucose, Bld 103 (*)    Total Bilirubin 0.1 (*)    All other components within normal limits  URINALYSIS, ROUTINE W REFLEX MICROSCOPIC (NOT AT [facility]) - Abnormal; Notable for the following:    APPearance CLOUDY (*)    Ketones, ur 15 (*)    Leukocytes, UA SMALL (*)    All other components within normal limits  URINE MICROSCOPIC-ADD ON - Abnormal; Notable for the following:    Squamous Epithelial / LPF 0-5 (*)    Bacteria, UA  RARE (*)    All other components within normal limits  ACETAMINOPHEN LEVEL - Abnormal; Notable for the following:    Acetaminophen (Tylenol), Serum <10 (*)    All other components within normal limits  CBG MONITORING, ED - Abnormal; Notable for the following:    Glucose-Capillary 101 (*)    All other components within normal limits  LIPASE, BLOOD  CBC  URINE RAPID DRUG SCREEN, HOSP PERFORMED  SALICYLATE LEVEL  ETHANOL  PROTIME-INR    Imaging Review Ct Abdomen Pelvis W Contrast  10/02/2015  CLINICAL DATA:  Lower abdominal pain since September 28, 2015. EXAM: CT ABDOMEN AND PELVIS WITH CONTRAST TECHNIQUE: Multidetector CT imaging of the abdomen and pelvis was performed using  the standard protocol following bolus administration of intravenous contrast. CONTRAST:  133mL ISOVUE-300 IOPAMIDOL (ISOVUE-300) INJECTION 61% COMPARISON:  April 18, 2012 FINDINGS: Normal lung bases. No free air or free fluid. There is a cyst in the hepatic dome. The liver, portal vein, gallbladder, spleen, adrenal glands, pancreas, and right kidney are normal. There is a probable cyst in the left kidney, too small to characterize but unchanged. The abdominal aorta is normal in caliber with atherosclerosis identified. No adenopathy. The stomach demonstrates a small hiatal hernia but is otherwise normal. The small bowel is normal. The colon demonstrates diverticulosis without diverticulitis. The the appendix is abnormal in appearance and contains more than 1 appendicolith with 1 near the origin. The appendix is also dilated measuring up to 15 mm. There is no periappendiceal stranding however. The pelvis demonstrates diverticulosis. The patient is status post hysterectomy. The bladder is normal. No adenopathy or mass. Heart the visualized bones demonstrate no acute abnormalities. IMPRESSION: 1. The appendix is abnormal. It is dilated up to 14 mm and contains at least 3 appendicoliths. However, there is no periappendiceal stranding to  suggest active inflammation. 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 Mucoceles are often surgically removed, although not always in the acute setting. Electronically Signed   By: Dorise Bullion III M.D   On: 10/02/2015 23:54   I have personally reviewed and evaluated these images and lab results as part of my medical decision-making.   EKG Interpretation   Date/Time:  Wednesday October 02 2015 22:31:25 EDT Ventricular Rate:  80 PR Interval:    QRS Duration: 95 QT Interval:  413 QTC Calculation: 477 R Axis:   26 Text Interpretation:  Sinus rhythm Abnormal R-wave progression, early  transition No significant change since last tracing Confirmed by  Winfred Leeds  MD, SAM [phone]) on 10/02/2015 10:35:04 PM      MDM   Final diagnoses:  Right lower quadrant abdominal pain    55 year old female presenting today for lower abdominal pain. Reports increasing over the last 4 days. Today she was at home and had increasing pain with associated presyncopal episode.  Denies any chest pain shortness of breath or neurologic symptoms preceding event. While in the waiting room had increasing pain and had additional syncopal episode. Che was brought to resuscitation bay where CBG within normal limits and vital signs within normal limits. She was given Narcan with alleviation of her altered mental status.    On exam after awaken pt HDS in NAD.  Denies drug abuse.  No clear opiate toxidrome prior to narcan however and UDS negative.  CBC, CMP, Lipase, and UA unremarkable.  ECG with no acute changes to indicate arrhythmogenic etiology of syncopal episode.  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  Surgery consulted and due to continued pain and syncopal episode agreed to admission, monitoring, and pain control overnight with likely operative removal of appendix.   Labs were viewed by myself and incorporated into medical decision making.  Discussed pertinent finding with patient or caregiver prior to  admission with no further questions.  Pt care supervised by my attending Dr. Winfred Leeds.   Geronimo Boot, MD PGY-3 Emergency Medicine     Geronimo Boot, MD 10/03/15 [id number]  Orlie Dakin, MD 10/03/15 [phone]

## 2015-10-02 NOTE — ED Notes (Signed)
Pt has been out in lobby for 1 hour, family member states suddenly went unresponsive. Escorted to trauma room via stretcher.

## 2015-10-03 ENCOUNTER — Encounter (HOSPITAL_COMMUNITY): Payer: Self-pay | Admitting: *Deleted

## 2015-10-03 ENCOUNTER — Encounter (HOSPITAL_COMMUNITY)
Admission: EM | Disposition: A | Discharge status: Home / Self Care | Payer: Self-pay | Source: Home / Self Care | Attending: Emergency Medicine

## 2015-10-03 ENCOUNTER — Observation Stay (HOSPITAL_COMMUNITY): Payer: 59 | Admitting: Certified Registered Nurse Anesthetist

## 2015-10-03 DIAGNOSIS — K388 Other specified diseases of appendix: Secondary | ICD-10-CM

## 2015-10-03 HISTORY — DX: Other specified diseases of appendix: K38.8

## 2015-10-03 HISTORY — PX: LAPAROSCOPIC APPENDECTOMY: SHX408

## 2015-10-03 HISTORY — PX: COLONOSCOPY WITH PROPOFOL: SHX5780

## 2015-10-03 LAB — PROTIME-INR
INR: 0.92 (ref 0.00–1.49)
Prothrombin Time: 12.6 seconds (ref 11.6–15.2)

## 2015-10-03 LAB — SURGICAL PCR SCREEN
MRSA, PCR: NEGATIVE
Staphylococcus aureus: NEGATIVE

## 2015-10-03 SURGERY — APPENDECTOMY, LAPAROSCOPIC
Anesthesia: General | Site: Abdomen

## 2015-10-03 MED ORDER — BUPIVACAINE-EPINEPHRINE 0.25% -1:200000 IJ SOLN
INTRAMUSCULAR | Status: DC | PRN
  Administered 2015-10-03: 10 mL

## 2015-10-03 MED ORDER — OXYCODONE HCL 5 MG PO TABS: 5.0000 mg | ORAL_TABLET | Freq: Once | ORAL | Status: DC | PRN

## 2015-10-03 MED ORDER — SUCCINYLCHOLINE CHLORIDE 200 MG/10ML IV SOSY
PREFILLED_SYRINGE | INTRAVENOUS | Status: AC
  Filled 2015-10-03: qty 10

## 2015-10-03 MED ORDER — FENTANYL CITRATE (PF) 100 MCG/2ML IJ SOLN
100.0000 ug | Freq: Once | INTRAMUSCULAR | Status: DC
  Filled 2015-10-03: qty 2

## 2015-10-03 MED ORDER — BUPIVACAINE-EPINEPHRINE (PF) 0.25% -1:200000 IJ SOLN
INTRAMUSCULAR | Status: AC
Start: 2015-10-03 — End: 2015-10-03
  Filled 2015-10-03: qty 30

## 2015-10-03 MED ORDER — MIDAZOLAM HCL 2 MG/2ML IJ SOLN
INTRAMUSCULAR | Status: AC
  Filled 2015-10-03: qty 2

## 2015-10-03 MED ORDER — FENTANYL CITRATE (PF) 100 MCG/2ML IJ SOLN
INTRAMUSCULAR | Status: DC | PRN
  Administered 2015-10-03: 100 ug via INTRAVENOUS
  Administered 2015-10-03: 50 ug via INTRAVENOUS

## 2015-10-03 MED ORDER — FENTANYL CITRATE (PF) 100 MCG/2ML IJ SOLN
INTRAMUSCULAR | Status: AC
  Administered 2015-10-03: 100 ug
  Filled 2015-10-03: qty 2

## 2015-10-03 MED ORDER — DEXAMETHASONE SODIUM PHOSPHATE 10 MG/ML IJ SOLN
INTRAMUSCULAR | Status: AC
  Filled 2015-10-03: qty 1

## 2015-10-03 MED ORDER — FENTANYL CITRATE (PF) 100 MCG/2ML IJ SOLN
50.0000 ug | Freq: Once | INTRAMUSCULAR | Status: AC
  Administered 2015-10-03: 50 ug via INTRAVENOUS
  Filled 2015-10-03: qty 2

## 2015-10-03 MED ORDER — ONDANSETRON HCL 4 MG/2ML IJ SOLN: 4.0000 mg | Freq: Four times a day (QID) | INTRAMUSCULAR | Status: DC | PRN

## 2015-10-03 MED ORDER — LIDOCAINE HCL (CARDIAC) 20 MG/ML IV SOLN
INTRAVENOUS | Status: DC | PRN
  Administered 2015-10-03: 60 mg via INTRAVENOUS

## 2015-10-03 MED ORDER — ONDANSETRON HCL 4 MG/2ML IJ SOLN
INTRAMUSCULAR | Status: AC
Start: 2015-10-03 — End: 2015-10-03
  Filled 2015-10-03: qty 4

## 2015-10-03 MED ORDER — DEXTROSE 5 % IV SOLN
2.0000 g | Freq: Once | INTRAVENOUS | Status: AC
  Administered 2015-10-03: 2 g via INTRAVENOUS
  Filled 2015-10-03: qty 2

## 2015-10-03 MED ORDER — SUGAMMADEX SODIUM 200 MG/2ML IV SOLN
INTRAVENOUS | Status: AC
  Filled 2015-10-03: qty 2

## 2015-10-03 MED ORDER — ACETAMINOPHEN-CODEINE #3 300-30 MG PO TABS
1.0000 | ORAL_TABLET | ORAL | Status: DC | PRN
  Filled 2015-10-03: qty 1

## 2015-10-03 MED ORDER — PROPOFOL 10 MG/ML IV BOLUS
INTRAVENOUS | Status: AC
  Filled 2015-10-03: qty 40

## 2015-10-03 MED ORDER — LIDOCAINE 2% (20 MG/ML) 5 ML SYRINGE
INTRAMUSCULAR | Status: AC
  Filled 2015-10-03: qty 5

## 2015-10-03 MED ORDER — LIDOCAINE 2% (20 MG/ML) 5 ML SYRINGE
INTRAMUSCULAR | Status: AC
  Filled 2015-10-03: qty 10

## 2015-10-03 MED ORDER — HYDROCODONE-ACETAMINOPHEN 5-325 MG PO TABS
1.0000 | ORAL_TABLET | ORAL | Status: DC | PRN
  Administered 2015-10-03 (×2): 1 via ORAL
  Administered 2015-10-04: 2 via ORAL
  Administered 2015-10-04 (×2): 1 via ORAL
  Administered 2015-10-05 (×2): 2 via ORAL
  Filled 2015-10-03: qty 2
  Filled 2015-10-03 (×3): qty 1
  Filled 2015-10-03: qty 2
  Filled 2015-10-03: qty 1
  Filled 2015-10-03: qty 2

## 2015-10-03 MED ORDER — GUAIFENESIN-DM 100-10 MG/5ML PO SYRP: 10.0000 mL | ORAL_SOLUTION | ORAL | Status: DC | PRN

## 2015-10-03 MED ORDER — ONDANSETRON HCL 4 MG/2ML IJ SOLN
INTRAMUSCULAR | Status: DC | PRN
  Administered 2015-10-03: 4 mg via INTRAVENOUS

## 2015-10-03 MED ORDER — SODIUM CHLORIDE 0.9 % IV SOLN
INTRAVENOUS | Status: AC
  Administered 2015-10-03: 02:00:00 via INTRAVENOUS

## 2015-10-03 MED ORDER — PHENYLEPHRINE 40 MCG/ML (10ML) SYRINGE FOR IV PUSH (FOR BLOOD PRESSURE SUPPORT)
PREFILLED_SYRINGE | INTRAVENOUS | Status: AC
  Filled 2015-10-03: qty 10

## 2015-10-03 MED ORDER — ONDANSETRON HCL 4 MG/2ML IJ SOLN
4.0000 mg | Freq: Once | INTRAMUSCULAR | Status: AC
  Administered 2015-10-03: 4 mg via INTRAVENOUS
  Filled 2015-10-03: qty 2

## 2015-10-03 MED ORDER — CLONAZEPAM 0.5 MG PO TABS: 0.5000 mg | ORAL_TABLET | Freq: Three times a day (TID) | ORAL | Status: DC | PRN

## 2015-10-03 MED ORDER — SUGAMMADEX SODIUM 200 MG/2ML IV SOLN
INTRAVENOUS | Status: DC | PRN
  Administered 2015-10-03: 200 mg via INTRAVENOUS

## 2015-10-03 MED ORDER — OXYCODONE HCL 5 MG PO TABS
5.0000 mg | ORAL_TABLET | ORAL | Status: DC | PRN
  Filled 2015-10-03: qty 2

## 2015-10-03 MED ORDER — HYDROMORPHONE HCL 1 MG/ML IJ SOLN: 0.2500 mg | INTRAMUSCULAR | Status: DC | PRN

## 2015-10-03 MED ORDER — ONDANSETRON HCL 4 MG/2ML IJ SOLN
4.0000 mg | Freq: Three times a day (TID) | INTRAMUSCULAR | Status: AC | PRN
  Administered 2015-10-03 – 2015-10-04 (×2): 4 mg via INTRAVENOUS
  Filled 2015-10-03 (×2): qty 2

## 2015-10-03 MED ORDER — HYDROCORTISONE 1 % EX CREA
1.0000 "application " | TOPICAL_CREAM | Freq: Three times a day (TID) | CUTANEOUS | Status: DC | PRN
  Filled 2015-10-03: qty 28

## 2015-10-03 MED ORDER — ONDANSETRON HCL 4 MG/2ML IJ SOLN
4.0000 mg | Freq: Three times a day (TID) | INTRAMUSCULAR | Status: AC | PRN
Start: 2015-10-03 — End: 2015-10-03
  Administered 2015-10-03: 4 mg via INTRAVENOUS
  Filled 2015-10-03 (×2): qty 2

## 2015-10-03 MED ORDER — FENTANYL CITRATE (PF) 250 MCG/5ML IJ SOLN
INTRAMUSCULAR | Status: AC
  Filled 2015-10-03: qty 5

## 2015-10-03 MED ORDER — SCOPOLAMINE 1 MG/3DAYS TD PT72
MEDICATED_PATCH | TRANSDERMAL | Status: AC
Start: 2015-10-03 — End: 2015-10-03
  Filled 2015-10-03: qty 1

## 2015-10-03 MED ORDER — HYDROMORPHONE HCL 1 MG/ML IJ SOLN
0.5000 mg | INTRAMUSCULAR | Status: AC | PRN
  Administered 2015-10-03 (×2): 0.5 mg via INTRAVENOUS
  Filled 2015-10-03 (×2): qty 1

## 2015-10-03 MED ORDER — 0.9 % SODIUM CHLORIDE (POUR BTL) OPTIME
TOPICAL | Status: DC | PRN
  Administered 2015-10-03: 1000 mL

## 2015-10-03 MED ORDER — ENOXAPARIN SODIUM 40 MG/0.4ML ~~LOC~~ SOLN
40.0000 mg | SUBCUTANEOUS | Status: DC
  Administered 2015-10-03 – 2015-10-04 (×2): 40 mg via SUBCUTANEOUS
  Filled 2015-10-03 (×2): qty 0.4

## 2015-10-03 MED ORDER — ALUM & MAG HYDROXIDE-SIMETH 200-200-20 MG/5ML PO SUSP: 30.0000 mL | Freq: Four times a day (QID) | ORAL | Status: DC | PRN

## 2015-10-03 MED ORDER — SCOPOLAMINE 1 MG/3DAYS TD PT72
1.0000 | MEDICATED_PATCH | TRANSDERMAL | Status: DC
  Administered 2015-10-03: 1.5 mg via TRANSDERMAL
  Filled 2015-10-03: qty 1

## 2015-10-03 MED ORDER — LACTATED RINGERS IV SOLN
INTRAVENOUS | Status: DC | PRN
  Administered 2015-10-03 (×2): via INTRAVENOUS

## 2015-10-03 MED ORDER — EPHEDRINE 5 MG/ML INJ
INTRAVENOUS | Status: AC
  Filled 2015-10-03: qty 20

## 2015-10-03 MED ORDER — PROPOFOL 10 MG/ML IV BOLUS
INTRAVENOUS | Status: DC | PRN
  Administered 2015-10-03: 140 mg via INTRAVENOUS

## 2015-10-03 MED ORDER — OXYCODONE HCL 5 MG/5ML PO SOLN: 5.0000 mg | Freq: Once | ORAL | Status: DC | PRN

## 2015-10-03 MED ORDER — DEXAMETHASONE SODIUM PHOSPHATE 10 MG/ML IJ SOLN
INTRAMUSCULAR | Status: DC | PRN
  Administered 2015-10-03: 5 mg via INTRAVENOUS

## 2015-10-03 MED ORDER — SUCCINYLCHOLINE CHLORIDE 20 MG/ML IJ SOLN
INTRAMUSCULAR | Status: DC | PRN
  Administered 2015-10-03: 100 mg via INTRAVENOUS

## 2015-10-03 MED ORDER — LACTATED RINGERS IV SOLN
INTRAVENOUS | Status: DC
Start: 2015-10-03 — End: 2015-10-03
  Administered 2015-10-03: 10:00:00 via INTRAVENOUS

## 2015-10-03 MED ORDER — PHENOL 1.4 % MT LIQD: 1.0000 | OROMUCOSAL | Status: DC | PRN

## 2015-10-03 MED ORDER — MENTHOL 3 MG MT LOZG
1.0000 | LOZENGE | OROMUCOSAL | Status: DC | PRN
  Administered 2015-10-04: 3 mg via ORAL
  Filled 2015-10-03: qty 9

## 2015-10-03 MED ORDER — SODIUM CHLORIDE 0.9 % IV SOLN
INTRAVENOUS | Status: DC
  Administered 2015-10-03 – 2015-10-05 (×3): via INTRAVENOUS

## 2015-10-03 MED ORDER — SODIUM CHLORIDE 0.9 % IR SOLN
Status: DC | PRN
  Administered 2015-10-03: 1000 mL

## 2015-10-03 MED ORDER — ROCURONIUM BROMIDE 100 MG/10ML IV SOLN
INTRAVENOUS | Status: DC | PRN
  Administered 2015-10-03: 40 mg via INTRAVENOUS

## 2015-10-03 MED ORDER — HYDROCORTISONE 2.5 % RE CREA
1.0000 "application " | TOPICAL_CREAM | Freq: Four times a day (QID) | RECTAL | Status: DC | PRN
  Filled 2015-10-03: qty 28.35

## 2015-10-03 MED ORDER — ONDANSETRON HCL 4 MG/2ML IJ SOLN
INTRAMUSCULAR | Status: AC
  Filled 2015-10-03: qty 2

## 2015-10-03 MED ORDER — LEVOTHYROXINE SODIUM 25 MCG PO TABS
137.0000 ug | ORAL_TABLET | Freq: Every day | ORAL | Status: DC
  Administered 2015-10-03 – 2015-10-04 (×2): 137 ug via ORAL
  Filled 2015-10-03 (×2): qty 1

## 2015-10-03 MED ORDER — HYDROMORPHONE HCL 1 MG/ML IJ SOLN: 0.5000 mg | INTRAMUSCULAR | Status: AC | PRN

## 2015-10-03 SURGICAL SUPPLY — 45 items
APPLIER CLIP ROT 10 11.4 M/L (STAPLE)
APR CLP MED LRG 11.4X10 (STAPLE)
BAG SPEC RTRVL 10 TROC 200 (ENDOMECHANICALS) ×1
CANISTER SUCTION 2500CC (MISCELLANEOUS) ×3 IMPLANT
CHLORAPREP W/TINT 26ML (MISCELLANEOUS) ×3 IMPLANT
CLIP APPLIE ROT 10 11.4 M/L (STAPLE) IMPLANT
CLOSURE WOUND 1/2 X4 (GAUZE/BANDAGES/DRESSINGS) ×1
COVER SURGICAL LIGHT HANDLE (MISCELLANEOUS) ×3 IMPLANT
CUTTER FLEX LINEAR 45M (STAPLE) ×3 IMPLANT
DEVICE TROCAR PUNCTURE CLOSURE (ENDOMECHANICALS) ×3 IMPLANT
ELECT REM PT RETURN 9FT ADLT (ELECTROSURGICAL) ×3
ELECTRODE REM PT RTRN 9FT ADLT (ELECTROSURGICAL) ×1 IMPLANT
GLOVE BIO SURGEON STRL SZ7 (GLOVE) ×3 IMPLANT
GLOVE BIOGEL PI IND STRL 7.5 (GLOVE) ×1 IMPLANT
GLOVE BIOGEL PI IND STRL 8.5 (GLOVE) IMPLANT
GLOVE BIOGEL PI INDICATOR 7.5 (GLOVE) ×2
GLOVE BIOGEL PI INDICATOR 8.5 (GLOVE) ×2
GOWN STRL REUS W/ TWL LRG LVL3 (GOWN DISPOSABLE) ×3 IMPLANT
GOWN STRL REUS W/TWL LRG LVL3 (GOWN DISPOSABLE) ×12
KIT BASIN OR (CUSTOM PROCEDURE TRAY) ×3 IMPLANT
KIT ROOM TURNOVER OR (KITS) ×3 IMPLANT
LIQUID BAND (GAUZE/BANDAGES/DRESSINGS) ×3 IMPLANT
NS IRRIG 1000ML POUR BTL (IV SOLUTION) ×3 IMPLANT
PAD ARMBOARD 7.5X6 YLW CONV (MISCELLANEOUS) ×6 IMPLANT
POUCH RETRIEVAL ECOSAC 10 (ENDOMECHANICALS) ×1 IMPLANT
POUCH RETRIEVAL ECOSAC 10MM (ENDOMECHANICALS) ×2
RELOAD 45 VASCULAR/THIN (ENDOMECHANICALS) ×3 IMPLANT
RELOAD STAPLE 45 2.5 WHT GRN (ENDOMECHANICALS) ×1 IMPLANT
RELOAD STAPLE 45 3.5 BLU ETS (ENDOMECHANICALS) IMPLANT
RELOAD STAPLE TA45 3.5 REG BLU (ENDOMECHANICALS) ×3 IMPLANT
SCALPEL HARMONIC ACE (MISCELLANEOUS) ×3 IMPLANT
SCISSORS LAP 5X35 DISP (ENDOMECHANICALS) ×2 IMPLANT
SET IRRIG TUBING LAPAROSCOPIC (IRRIGATION / IRRIGATOR) ×3 IMPLANT
SLEEVE ENDOPATH XCEL 5M (ENDOMECHANICALS) ×3 IMPLANT
SPECIMEN JAR SMALL (MISCELLANEOUS) ×3 IMPLANT
STRIP CLOSURE SKIN 1/2X4 (GAUZE/BANDAGES/DRESSINGS) ×2 IMPLANT
SUT MNCRL AB 4-0 PS2 18 (SUTURE) ×3 IMPLANT
SUT VICRYL 0 UR6 27IN ABS (SUTURE) ×3 IMPLANT
TOWEL OR 17X24 6PK STRL BLUE (TOWEL DISPOSABLE) ×3 IMPLANT
TOWEL OR 17X26 10 PK STRL BLUE (TOWEL DISPOSABLE) ×3 IMPLANT
TRAY FOLEY CATH 16FR SILVER (SET/KITS/TRAYS/PACK) ×3 IMPLANT
TRAY LAPAROSCOPIC MC (CUSTOM PROCEDURE TRAY) ×3 IMPLANT
TROCAR XCEL BLUNT TIP 100MML (ENDOMECHANICALS) ×3 IMPLANT
TROCAR XCEL NON-BLD 5MMX100MML (ENDOMECHANICALS) ×3 IMPLANT
TUBING INSUFFLATION (TUBING) ×3 IMPLANT

## 2015-10-03 NOTE — Progress Notes (Addendum)
Patient ID: Bethany [name], female   DOB: 08-21-1960, 55 y.o.   MRN: [id number] Have seen and evaluated patient, reviewed studies. She clinically appears to have appendicitis.  Concern for mucocele on ct scan also.  We discussed lap appy today with risks. We discussed if there is tumor present she might need second surgery- right colectomy at some point also. Will proceed this am Has csc in 2016 that had polyps but ow nl, due again 3 years

## 2015-10-03 NOTE — Anesthesia Preprocedure Evaluation (Signed)
Anesthesia Evaluation  Patient identified by MRN, date of birth, ID band Patient awake    Reviewed: Allergy & Precautions, NPO status , Patient's Chart, lab work & pertinent test results  Airway Mallampati: II   Neck ROM: full    Dental   Pulmonary former smoker,    breath sounds clear to auscultation       Cardiovascular hypertension, + Valvular Problems/Murmurs MVP  Rhythm:regular Rate:Normal     Neuro/Psych Anxiety  Neuromuscular disease    GI/Hepatic hiatal hernia, GERD  ,  Endo/Other  Hypothyroidism   Renal/GU      Musculoskeletal  (+) Arthritis ,   Abdominal   Peds  Hematology   Anesthesia Other Findings   Reproductive/Obstetrics                             Anesthesia Physical Anesthesia Plan  ASA: II  Anesthesia Plan: General   Post-op Pain Management:    Induction: Intravenous  Airway Management Planned: Oral ETT  Additional Equipment:   Intra-op Plan:   Post-operative Plan: Extubation in OR  Informed Consent: I have reviewed the patients History and Physical, chart, labs and discussed the procedure including the risks, benefits and alternatives for the proposed anesthesia with the patient or authorized representative who has indicated his/her understanding and acceptance.     Plan Discussed with: CRNA, Anesthesiologist and Surgeon  Anesthesia Plan Comments:         Anesthesia Quick Evaluation

## 2015-10-03 NOTE — Progress Notes (Signed)
0130 Dr. Barry Dienes notified of pt.'s arrival to floor via text msg.

## 2015-10-03 NOTE — Anesthesia Procedure Notes (Signed)
Procedure Name: Intubation Date/Time: 10/03/2015 11:10 AM Performed by: Tressia Miners LEFFEW Pre-anesthesia Checklist: Patient identified, Patient being monitored, Timeout performed, Emergency Drugs available and Suction available Patient Re-evaluated:Patient Re-evaluated prior to inductionOxygen Delivery Method: Circle System Utilized Preoxygenation: Pre-oxygenation with 100% oxygen Intubation Type: IV induction Ventilation: Mask ventilation without difficulty Laryngoscope Size: Mac and 3 Grade View: Grade I Tube type: Oral Tube size: 7.0 mm Number of attempts: 1 Airway Equipment and Method: Stylet Placement Confirmation: ETT inserted through vocal cords under direct vision,  positive ETCO2 and breath sounds checked- equal and bilateral Secured at: 22 cm Tube secured with: Tape Dental Injury: Teeth and Oropharynx as per pre-operative assessment

## 2015-10-03 NOTE — Discharge Summary (Signed)
Physician Discharge Summary  Bethany [name] [id number] DOB: [birth year] DOA: 10/02/2015  PCP: PROVIDER NOT IN SYSTEM  Consultation: None   Admit date: 10/02/2015 Discharge date: 10/03/2015   Discharge Diagnoses:  1. Appendicitis    Surgical Procedure: Laparoscopic Appendectomy   Discharge Condition: Stable  Disposition: Home   Diet recommendation: As tolerated   Filed Weights   10/02/15 2015 10/03/15 0130  Weight: 186 lb 6 oz (84.539 kg) 178 lb 1.6 oz (80.786 kg)       Hospital Course:  Patient presented to the ED at [facility] with worsening RLQ pain for three days. Patient was afebrile with a WBC within the reference range of normal. CT abdomen in the ED revealed a dilated appendix and findings consistent with a mucocele. Patient underwent elective laparoscopic appendectomy on October 03, 2015 by Dr. Rolm Bookbinder without complications. On POD#1, patient's pain was well controlled, she was ambulating, tolerating a full diet and passing flatus. Patient was deemed ready for discharge. All patient questions were answered.    Physical Exam: Gen: NAD HEENT: EOMI, NCAT CV: RRR R: CTAB GI: soft, ATTP, wounds c/d/i      Medication List    ASK your doctor about these medications        acetaminophen 500 MG tablet  Commonly known as:  TYLENOL  Take 500-1,000 mg by mouth every 6 (six) hours as needed for mild pain or moderate pain.     ACZONE 5 % topical gel  Generic drug:  Dapsone  As directed     clonazePAM 0.5 MG tablet  Commonly known as:  KLONOPIN  Take 0.5 mg by mouth 3 (three) times daily as needed. For anxiety     ergocalciferol 50000 units capsule  Commonly known as:  VITAMIN D2  Take 50,000 Units by mouth 2 (two) times a week. On Thurs & Sun     estradiol 0.5 MG tablet  Commonly known as:  ESTRACE  Take 1 tablet (0.5 mg total) by mouth daily.     ibuprofen 200 MG tablet  Commonly known as:  ADVIL,MOTRIN  Take 600 mg by mouth every 6 (six) hours as  needed. For pain     levothyroxine 137 MCG tablet  Commonly known as:  SYNTHROID, LEVOTHROID  Take 137 mcg by mouth at bedtime.     QSYMIA 11.25-69 MG Cp24  Generic drug:  Phentermine-Topiramate  Take 1 capsule by mouth daily.     valACYclovir 1000 MG tablet  Commonly known as:  VALTREX  As directed (#4 filled)          The results of significant diagnostics from this hospitalization (including imaging, microbiology, ancillary and laboratory) are listed below for reference.    Significant Diagnostic Studies: Ct Abdomen Pelvis W Contrast  10/02/2015  CLINICAL DATA:  Lower abdominal pain since September 28, 2015. EXAM: CT ABDOMEN AND PELVIS WITH CONTRAST TECHNIQUE: Multidetector CT imaging of the abdomen and pelvis was performed using the standard protocol following bolus administration of intravenous contrast. CONTRAST:  117mL ISOVUE-300 IOPAMIDOL (ISOVUE-300) INJECTION 61% COMPARISON:  April 18, 2012 FINDINGS: Normal lung bases. No free air or free fluid. There is a cyst in the hepatic dome. The liver, portal vein, gallbladder, spleen, adrenal glands, pancreas, and right kidney are normal. There is a probable cyst in the left kidney, too small to characterize but unchanged. The abdominal aorta is normal in caliber with atherosclerosis identified. No adenopathy. The stomach demonstrates a small hiatal hernia but is otherwise normal. The small bowel  is normal. The colon demonstrates diverticulosis without diverticulitis. The the appendix is abnormal in appearance and contains more than 1 appendicolith with 1 near the origin. The appendix is also dilated measuring up to 15 mm. There is no periappendiceal stranding however. The pelvis demonstrates diverticulosis. The patient is status post hysterectomy. The bladder is normal. No adenopathy or mass. Heart the visualized bones demonstrate no acute abnormalities. IMPRESSION: 1. The appendix is abnormal. It is dilated up to 14 mm and contains at least 3  appendicoliths. However, there is no periappendiceal stranding to suggest active inflammation. 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 Mucoceles are often surgically removed, although not always in the acute setting. Electronically Signed   By: Dorise Bullion III M.D   On: 10/02/2015 23:54    Microbiology: Recent Results (from the past 240 hour(s))  Surgical pcr screen     Status: None   Collection Time: 10/03/15  8:52 AM  Result Value Ref Range Status   MRSA, PCR NEGATIVE NEGATIVE Final   Staphylococcus aureus NEGATIVE NEGATIVE Final    Comment:        The Xpert SA Assay (FDA approved for NASAL specimens in patients over 66 years of age), is one component of a comprehensive surveillance program.  Test performance has been validated by [facility] for patients greater than or equal to 61 year old. It is not intended to diagnose infection nor to guide or monitor treatment.      Labs: Basic Metabolic Panel:  Recent Labs Lab 10/02/15 2022  NA 139  K 4.0  CL 108  CO2 25  GLUCOSE 103*  BUN 10  CREATININE 0.98  CALCIUM 9.6   Liver Function Tests:  Recent Labs Lab 10/02/15 2022  AST 18  ALT 19  ALKPHOS 67  BILITOT 0.1*  PROT 7.2  ALBUMIN 4.4    Recent Labs Lab 10/02/15 2022  LIPASE 23   No results for input(s): AMMONIA in the last 168 hours. CBC:  Recent Labs Lab 10/02/15 2022  WBC 7.8  HGB 14.3  HCT 44.7  MCV 92.4  PLT 335   Cardiac Enzymes: No results for input(s): CKTOTAL, CKMB, CKMBINDEX, TROPONINI in the last 168 hours. BNP: BNP (last 3 results) No results for input(s): BNP in the last 8760 hours.  ProBNP (last 3 results) No results for input(s): PROBNP in the last 8760 hours.  CBG:  Recent Labs Lab 10/02/15 2117  GLUCAP 101*    Active Problems:   Mucocele of appendix   Time coordinating discharge: <30 minutes   Signed:  Vallarie Mare PASII

## 2015-10-03 NOTE — Op Note (Signed)
Preoperative diagnosis: acute appendicitis Postoperative diagnosis: same as above Procedure: laparoscopic appendectomy Surgeon: Dr Serita Grammes Anesthesia: general EBL: minimal Drains none Specimen appendix to pathology Complications: none Sponge count correct at completion Disposition to recovery stable  Indications: This is a 73 yof who has acute appendicitis and has question of a mucocele.  We discussed proceeding with laparoscopic appendectomy.   Procedure: After informed consent was obtained the patient was taken to the operating room. She was given antibiotics. Sequential compression devices were on her legs. She was placed under general anesthesia without complication. Her abdomen was prepped and draped in the standard sterile surgical fashion. A surgical timeout was then performed.  I infiltrated marcaine below the umbilicus. I made a vertical incision and grasped the fascia. I then incised the fascia and entered the peritoneum bluntly. I placed a 0 vicryl pursestring suture and inserted a hasson trocar.  I then insufflated the abdomen to 15 mm Hg pressure.I then inserted 2 5 mm trocars in the suprapubic region and the left lower quadrant.  I then was able to identify the appendix. She had acute appendicitis.  I did not see a mass. I was able to dissect the base and divide this with a gia stapler. I then divided the appendiceal mesentery with the harmonic scalpel.  This was placed in a bag and removed.  I obtained hemostasis.  I irrgiated.  I removed the hasson trocar and tied the pursestring down. I placed an additional 0 vicryl stitch at the umbilical incision with the endoclose device.  I then desufflated the abdomen and removed all my remaining trocars. I then closed these with 4-0 Monocryl and Dermabond. She tolerated this well was extubated and transferred to the recovery room in stable condition

## 2015-10-03 NOTE — Transfer of Care (Signed)
Immediate Anesthesia Transfer of Care Note  Patient: Bethany [name]  Procedure(s) Performed: Procedure(s): APPENDECTOMY LAPAROSCOPIC (N/A)  Patient Location: PACU  Anesthesia Type:General  Level of Consciousness: awake, patient cooperative and responds to stimulation  Airway & Oxygen Therapy: Patient Spontanous Breathing and Patient connected to nasal cannula oxygen  Post-op Assessment: Report given to RN, Post -op Vital signs reviewed and stable and Patient moving all extremities X 4  Post vital signs: Reviewed and stable  Last Vitals:  Filed Vitals:   10/03/15 1053 10/03/15 1216  BP:  137/83  Pulse: 95 93  Temp:  36.8 C  Resp: 18     Last Pain:  Filed Vitals:   10/03/15 1223  PainSc: 5       Patients Stated Pain Goal: 2 ([id number])  Complications: No apparent anesthesia complications

## 2015-10-03 NOTE — H&P (Signed)
Bethany [name] is an 55 y.o. female.   Chief Complaint: abdominal pain, RLQ HPI: Pt is a 55 yo F who presents with worsening RLQ pain for 3 days.  She describes the pain and going across the lower abdomen but worse on the right.  She has not had pain like this before.  She has had nausea, but no vomiting.  No fever/chills.  No change in bowel habits.  The pain was severe enough to awaken her from sleep.  She has a CT in 2014 without the mucocele present.    Past Medical History  Diagnosis Date  . Urinary incontinence     USI  . Vitamin D deficiency   . Hypertension   . Elevated cholesterol   . Hypothyroidism   . History of hiatal hernia   . GERD (gastroesophageal reflux disease)   . Arthritis     "right foot" (06/19/2014)  . Anxiety   . 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 Neuromuscular disorder ([place])   . Heart murmur   . MVP (mitral valve prolapse)   . Osteopenia 03/2015    T score -1.4 FRAX 4.3%/0.2%    Past Surgical History  Procedure Laterality Date  . Total thyroidectomy  2000's  . Breast cyst excision Right 1980's  . Vaginal hysterectomy  1990's  . Cyst excision Right ~ 2014    "between 2 toes"  . Nose surgery  2016    CANCER REMOVED    Family History  Problem Relation Age of Onset  . Hypertension Father   . Diabetes Father   . Heart disease Father   . Hypertension Brother   . Heart disease Paternal Grandfather   . Colon cancer Other     great maternal aunt   . Colon polyps Mother   . Esophageal cancer Neg Hx   . Rectal cancer Neg Hx   . Stomach cancer Neg Hx    Social History:  reports that she has quit smoking. Her smoking use included Cigarettes. She smoked 0.00 packs per day for 30 years. She has never used smokeless tobacco. She reports that she drinks alcohol. She reports that she does not use illicit drugs.  Allergies:  Allergies  Allergen Reactions  . Tape Hives  . Other Hives    Band-Aids    Medications Prior to Admission   Medication Sig Dispense Refill  . acetaminophen (TYLENOL) 500 MG tablet Take 500-1,000 mg by mouth every 6 (six) hours as needed for mild pain or moderate pain.    . clonazePAM (KLONOPIN) 0.5 MG tablet Take 0.5 mg by mouth 3 (three) times daily as needed. For anxiety    . ergocalciferol (VITAMIN D2) 50000 UNITS capsule Take 50,000 Units by mouth 2 (two) times a week. On Thurs & Sun    . ibuprofen (ADVIL,MOTRIN) 200 MG tablet Take 600 mg by mouth every 6 (six) hours as needed. For pain    . levothyroxine (SYNTHROID, LEVOTHROID) 137 MCG tablet Take 137 mcg by mouth at bedtime.    Marland Kitchen QSYMIA 11.25-69 MG CP24 Take 1 capsule by mouth daily.  5  . ACZONE 5 % topical gel As directed    . estradiol (ESTRACE) 0.5 MG tablet Take 1 tablet (0.5 mg total) by mouth daily. (Patient not taking: Reported on 10/02/2015) 90 tablet 4  . valACYclovir (VALTREX) 1000 MG tablet As directed (#4 filled)      Results for orders placed or performed during the hospital encounter  of 10/02/15 (from the past 48 hour(s))  Lipase, blood     Status: None   Collection Time: 10/02/15  8:22 PM  Result Value Ref Range   Lipase 23 11 - 51 U/L  Comprehensive metabolic panel     Status: Abnormal   Collection Time: 10/02/15  8:22 PM  Result Value Ref Range   Sodium 139 135 - 145 mmol/L   Potassium 4.0 3.5 - 5.1 mmol/L   Chloride 108 101 - 111 mmol/L   CO2 25 22 - 32 mmol/L   Glucose, Bld 103 (H) 65 - 99 mg/dL   BUN 10 6 - 20 mg/dL   Creatinine, Ser 0.98 0.44 - 1.00 mg/dL   Calcium 9.6 8.9 - 10.3 mg/dL   Total Protein 7.2 6.5 - 8.1 g/dL   Albumin 4.4 3.5 - 5.0 g/dL   AST 18 15 - 41 U/L   ALT 19 14 - 54 U/L   Alkaline Phosphatase 67 38 - 126 U/L   Total Bilirubin 0.1 (L) 0.3 - 1.2 mg/dL   GFR calc non Af Amer >60 >60 mL/min   GFR calc Af Amer >60 >60 mL/min    Comment: (NOTE) The eGFR has been calculated using the CKD EPI equation. This calculation has not been validated in all clinical situations. eGFR's persistently <60  mL/min signify possible Chronic Kidney Disease.    Anion gap 6 5 - 15  CBC     Status: None   Collection Time: 10/02/15  8:22 PM  Result Value Ref Range   WBC 7.8 4.0 - 10.5 K/uL   RBC 4.84 3.87 - 5.11 MIL/uL   Hemoglobin 14.3 12.0 - 15.0 g/dL   HCT 44.7 36.0 - 46.0 %   MCV 92.4 78.0 - 100.0 fL   MCH 29.5 26.0 - 34.0 pg   MCHC 32.0 30.0 - 36.0 g/dL   RDW 12.4 11.5 - 15.5 %   Platelets 335 150 - 400 K/uL  Urinalysis, Routine w reflex microscopic     Status: Abnormal   Collection Time: 10/02/15  8:22 PM  Result Value Ref Range   Color, Urine YELLOW YELLOW   APPearance CLOUDY (A) CLEAR   Specific Gravity, Urine 1.020 1.005 - 1.030   pH 6.5 5.0 - 8.0   Glucose, UA NEGATIVE NEGATIVE mg/dL   Hgb urine dipstick NEGATIVE NEGATIVE   Bilirubin Urine NEGATIVE NEGATIVE   Ketones, ur 15 (A) NEGATIVE mg/dL   Protein, ur NEGATIVE NEGATIVE mg/dL   Nitrite NEGATIVE NEGATIVE   Leukocytes, UA SMALL (A) NEGATIVE  Urine microscopic-add on     Status: Abnormal   Collection Time: 10/02/15  8:22 PM  Result Value Ref Range   Squamous Epithelial / LPF 0-5 (A) NONE SEEN   WBC, UA 0-5 0 - 5 WBC/hpf   RBC / HPF NONE SEEN 0 - 5 RBC/hpf   Bacteria, UA RARE (A) NONE SEEN  Rapid urine drug screen (hospital performed)     Status: None   Collection Time: 10/02/15  8:22 PM  Result Value Ref Range   Opiates NONE DETECTED NONE DETECTED   Cocaine NONE DETECTED NONE DETECTED   Benzodiazepines NONE DETECTED NONE DETECTED   Amphetamines NONE DETECTED NONE DETECTED   Tetrahydrocannabinol NONE DETECTED NONE DETECTED   Barbiturates NONE DETECTED NONE DETECTED    Comment:        DRUG SCREEN FOR MEDICAL PURPOSES ONLY.  IF CONFIRMATION IS NEEDED FOR ANY PURPOSE, NOTIFY LAB WITHIN 5 DAYS.  LOWEST DETECTABLE LIMITS FOR URINE DRUG SCREEN Drug Class       Cutoff (ng/mL) Amphetamine      1000 Barbiturate      200 Benzodiazepine   915 Tricyclics       056 Opiates          300 Cocaine          300 THC               50   POC CBG, ED     Status: Abnormal   Collection Time: 10/02/15  9:17 PM  Result Value Ref Range   Glucose-Capillary 101 (H) 65 - 99 mg/dL  Acetaminophen level     Status: Abnormal   Collection Time: 10/02/15  9:30 PM  Result Value Ref Range   Acetaminophen (Tylenol), Serum <10 (L) 10 - 30 ug/mL    Comment:        THERAPEUTIC CONCENTRATIONS VARY SIGNIFICANTLY. A RANGE OF 10-30 ug/mL MAY BE AN EFFECTIVE CONCENTRATION FOR MANY PATIENTS. HOWEVER, SOME ARE BEST TREATED AT CONCENTRATIONS OUTSIDE THIS RANGE. ACETAMINOPHEN CONCENTRATIONS >150 ug/mL AT 4 HOURS AFTER INGESTION AND >50 ug/mL AT 12 HOURS AFTER INGESTION ARE OFTEN ASSOCIATED WITH TOXIC REACTIONS.   Salicylate level     Status: None   Collection Time: 10/02/15  9:30 PM  Result Value Ref Range   Salicylate Lvl <9.7 2.8 - 30.0 mg/dL  Ethanol     Status: None   Collection Time: 10/02/15  9:30 PM  Result Value Ref Range   Alcohol, Ethyl (B) <5 <5 mg/dL    Comment:        LOWEST DETECTABLE LIMIT FOR SERUM ALCOHOL IS 5 mg/dL FOR MEDICAL PURPOSES ONLY   Protime-INR     Status: None   Collection Time: 10/03/15  1:10 AM  Result Value Ref Range   Prothrombin Time 12.6 11.6 - 15.2 seconds   INR 0.92 0.00 - 1.49   Ct Abdomen Pelvis W Contrast  10/02/2015  CLINICAL DATA:  Lower abdominal pain since September 28, 2015. EXAM: CT ABDOMEN AND PELVIS WITH CONTRAST TECHNIQUE: Multidetector CT imaging of the abdomen and pelvis was performed using the standard protocol following bolus administration of intravenous contrast. CONTRAST:  144m ISOVUE-300 IOPAMIDOL (ISOVUE-300) INJECTION 61% COMPARISON:  April 18, 2012 FINDINGS: Normal lung bases. No free air or free fluid. There is a cyst in the hepatic dome. The liver, portal vein, gallbladder, spleen, adrenal glands, pancreas, and right kidney are normal. There is a probable cyst in the left kidney, too small to characterize but unchanged. The abdominal aorta is normal in caliber  with atherosclerosis identified. No adenopathy. The stomach demonstrates a small hiatal hernia but is otherwise normal. The small bowel is normal. The colon demonstrates diverticulosis without diverticulitis. The the appendix is abnormal in appearance and contains more than 1 appendicolith with 1 near the origin. The appendix is also dilated measuring up to 15 mm. There is no periappendiceal stranding however. The pelvis demonstrates diverticulosis. The patient is status post hysterectomy. The bladder is normal. No adenopathy or mass. Heart the visualized bones demonstrate no acute abnormalities. IMPRESSION: 1. The appendix is abnormal. It is dilated up to 14 mm and contains at least 3 appendicoliths. However, there is no periappendiceal stranding to suggest active inflammation. 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 Mucoceles are often surgically removed, although not always in the acute setting. Electronically Signed   By: DDorise BullionIII M.D   On: 10/02/2015 23:54  Review of Systems  Constitutional: Negative.   HENT: Negative.   Eyes: Negative.   Respiratory: Negative.   Cardiovascular: Negative.   Gastrointestinal: Positive for nausea and abdominal pain. Negative for vomiting, diarrhea and constipation.  Genitourinary: Negative.   Musculoskeletal: Negative.   Skin: Negative.   Neurological: Negative.   Endo/Heme/Allergies: Negative.   Psychiatric/Behavioral: Negative.     Blood pressure 133/64, pulse 83, temperature 98.3 F (36.8 C), temperature source Oral, resp. rate 18, height _0  (1.702 m), weight 80.786 kg (178 lb 1.6 oz), SpO2 95 %. Physical Exam  Constitutional: She is oriented to person, place, and time. She appears well-developed and well-nourished. She appears distressed (looks uncomfortable).  HENT:  Head: Normocephalic and atraumatic.  Mouth/Throat: Oropharynx is clear and moist.  Eyes: Conjunctivae are normal. No scleral icterus.  Neck: Normal range of motion.  Neck supple. No tracheal deviation present. No thyromegaly present.  Cardiovascular: Normal rate, regular rhythm and intact distal pulses.   Respiratory: Effort normal. No respiratory distress.  GI: Soft. She exhibits distension (mild). There is tenderness (lower abdomen, worse RLQ). There is no rebound and no guarding.  Musculoskeletal: Normal range of motion. She exhibits no edema or tenderness.  Lymphadenopathy:    She has no cervical adenopathy.  Neurological: She is alert and oriented to person, place, and time.  Skin: Skin is warm and dry. No rash noted. She is not diaphoretic. No erythema. No pallor.  Psychiatric: She has a normal mood and affect. Her behavior is normal. Judgment and thought content normal.     Assessment/Plan Appendiceal mucocele with acute pain.   Probable early appendicitis  Will admit for observation.  Will need appendix removed due to mucocele.  Given presence of acute pain, will likely do as inpatient this hospitalization.    Reviewed appendectomy with patient.  Will let Dr. Donne Hazel know and review.    Stark Klein, MD 10/03/2015, 6:28 AM

## 2015-10-04 ENCOUNTER — Encounter (HOSPITAL_COMMUNITY): Payer: Self-pay | Admitting: General Surgery

## 2015-10-04 MED ORDER — ONDANSETRON HCL 4 MG/2ML IJ SOLN
4.0000 mg | Freq: Three times a day (TID) | INTRAMUSCULAR | Status: DC | PRN
  Administered 2015-10-04 – 2015-10-05 (×3): 4 mg via INTRAVENOUS
  Filled 2015-10-04 (×3): qty 2

## 2015-10-04 MED ORDER — SODIUM CHLORIDE 0.9 % IV BOLUS (SEPSIS)
500.0000 mL | Freq: Once | INTRAVENOUS | Status: AC
  Administered 2015-10-04: 500 mL via INTRAVENOUS

## 2015-10-04 MED ORDER — HYDROCODONE-ACETAMINOPHEN 5-325 MG PO TABS
1.0000 | ORAL_TABLET | Freq: Four times a day (QID) | ORAL | Status: DC | PRN
Start: 2015-10-04 — End: 2017-05-13

## 2015-10-04 MED ORDER — ONDANSETRON HCL 4 MG/2ML IJ SOLN: 4.0000 mg | Freq: Three times a day (TID) | INTRAMUSCULAR | Status: DC

## 2015-10-04 MED ORDER — IBUPROFEN 800 MG PO TABS: 800.0000 mg | ORAL_TABLET | Freq: Three times a day (TID) | ORAL | Status: DC | PRN

## 2015-10-04 NOTE — Progress Notes (Signed)
Patient walked with RN and stated that she was feeling dizzy and nauseated. Patient was taken back to her room at that time. She complained of pain- pain medication was given. RN walked patient again about 45 mins later- patient stated that the same pain came back. MD Donne Hazel notified- asked RN to add zofran q8. Patient is to eat lunch and then be reassessed.

## 2015-10-05 NOTE — Progress Notes (Signed)
2 Days Post-Op  Subjective: Feels better today. Ready to go home  Objective: Vital signs in last 24 hours: Temp:  [97.8 F (36.6 C)-98.9 F (37.2 C)] 97.8 F (36.6 C) (07/22 0629) Pulse Rate:  [63-73] 63 (07/22 0629) Resp:  [16-17] 17 (07/22 0629) BP: (134-155)/(72-85) 144/84 mmHg (07/22 0629) SpO2:  [96 %-98 %] 96 % (07/22 0629) Last BM Date:  (PTA)  Intake/Output from previous day: 07/21 0701 - 07/22 0700 In: 2761.3 [P.O.:940; I.V.:1821.3] Out: 1550 [Urine:1550] Intake/Output this shift: Total I/O In: 360 [P.O.:360] Out: -   Resp: clear to auscultation bilaterally Cardio: regular rate and rhythm GI: soft, mild tenderness. incisions look good  Lab Results:   Recent Labs  10/02/15 2022  WBC 7.8  HGB 14.3  HCT 44.7  PLT 335   BMET  Recent Labs  10/02/15 2022  NA 139  K 4.0  CL 108  CO2 25  GLUCOSE 103*  BUN 10  CREATININE 0.98  CALCIUM 9.6   PT/INR  Recent Labs  10/03/15 0110  LABPROT 12.6  INR 0.92   ABG No results for input(s): PHART, HCO3 in the last 72 hours.  Invalid input(s): PCO2, PO2  Studies/Results: No results found.  Anti-infectives: Anti-infectives    Start     Dose/Rate Route Frequency Ordered Stop   10/03/15 0830  cefTRIAXone (ROCEPHIN) 2 g in dextrose 5 % 50 mL IVPB     2 g 100 mL/hr over 30 Minutes Intravenous  Once 10/03/15 0804 10/03/15 1126      Assessment/Plan: s/p Procedure(s): APPENDECTOMY LAPAROSCOPIC (N/A) Advance diet Discharge     TOTH III,Maisyn Nouri S 10/05/2015

## 2015-10-05 NOTE — Progress Notes (Signed)
Pt ready to go home.  DC instructions reviewed and copy given.  Rx for Vicodin given and explained.  Pt self care to home accompanied by husband.  No further questions about home self care.

## 2015-10-07 NOTE — Anesthesia Postprocedure Evaluation (Signed)
Anesthesia Post Note  Patient: Bethany [name]  Procedure(s) Performed: Procedure(s) (LRB): APPENDECTOMY LAPAROSCOPIC (N/A)  Patient location during evaluation: PACU Anesthesia Type: General Level of consciousness: awake and alert and patient cooperative Pain management: pain level controlled Vital Signs Assessment: post-procedure vital signs reviewed and stable Respiratory status: spontaneous breathing and respiratory function stable Cardiovascular status: stable Anesthetic complications: no    Last Vitals:  Vitals:   10/04/15 2118 10/05/15 0629  BP: (!) 155/85 (!) 144/84  Pulse: 73 63  Resp: 17 17  Temp: 37.2 C 36.6 C    Last Pain:  Vitals:   10/05/15 0629  TempSrc: Oral  PainSc:                  Charenton S

## 2015-11-27 ENCOUNTER — Encounter (HOSPITAL_COMMUNITY): Payer: Self-pay

## 2016-04-03 DIAGNOSIS — L281 Prurigo nodularis: Secondary | ICD-10-CM | POA: Diagnosis not present

## 2016-04-03 DIAGNOSIS — Z85828 Personal history of other malignant neoplasm of skin: Secondary | ICD-10-CM | POA: Diagnosis not present

## 2016-04-07 DIAGNOSIS — E890 Postprocedural hypothyroidism: Secondary | ICD-10-CM | POA: Diagnosis not present

## 2016-04-07 DIAGNOSIS — Z8379 Family history of other diseases of the digestive system: Secondary | ICD-10-CM | POA: Diagnosis not present

## 2016-04-07 DIAGNOSIS — E559 Vitamin D deficiency, unspecified: Secondary | ICD-10-CM | POA: Diagnosis not present

## 2016-04-07 DIAGNOSIS — E538 Deficiency of other specified B group vitamins: Secondary | ICD-10-CM | POA: Diagnosis not present

## 2016-04-07 DIAGNOSIS — Z862 Personal history of diseases of the blood and blood-forming organs and certain disorders involving the immune mechanism: Secondary | ICD-10-CM | POA: Diagnosis not present

## 2016-04-17 DIAGNOSIS — L738 Other specified follicular disorders: Secondary | ICD-10-CM | POA: Diagnosis not present

## 2016-04-17 DIAGNOSIS — Z85828 Personal history of other malignant neoplasm of skin: Secondary | ICD-10-CM | POA: Diagnosis not present

## 2016-04-17 DIAGNOSIS — L603 Nail dystrophy: Secondary | ICD-10-CM | POA: Diagnosis not present

## 2016-04-17 DIAGNOSIS — L739 Follicular disorder, unspecified: Secondary | ICD-10-CM | POA: Diagnosis not present

## 2016-05-08 ENCOUNTER — Other Ambulatory Visit: Payer: Self-pay | Admitting: Gynecology

## 2016-05-08 DIAGNOSIS — Z1231 Encounter for screening mammogram for malignant neoplasm of breast: Secondary | ICD-10-CM

## 2016-05-26 ENCOUNTER — Ambulatory Visit: Payer: 59

## 2016-06-12 ENCOUNTER — Ambulatory Visit: Payer: 59

## 2016-06-29 ENCOUNTER — Ambulatory Visit: Payer: 59

## 2016-07-15 ENCOUNTER — Ambulatory Visit: Payer: 59

## 2016-08-31 ENCOUNTER — Ambulatory Visit: Payer: 59

## 2016-10-03 DIAGNOSIS — S80862A Insect bite (nonvenomous), left lower leg, initial encounter: Secondary | ICD-10-CM | POA: Diagnosis not present

## 2016-10-07 DIAGNOSIS — L738 Other specified follicular disorders: Secondary | ICD-10-CM | POA: Diagnosis not present

## 2016-10-07 DIAGNOSIS — Z85828 Personal history of other malignant neoplasm of skin: Secondary | ICD-10-CM | POA: Diagnosis not present

## 2016-10-07 DIAGNOSIS — S70361A Insect bite (nonvenomous), right thigh, initial encounter: Secondary | ICD-10-CM | POA: Diagnosis not present

## 2016-10-07 DIAGNOSIS — L739 Follicular disorder, unspecified: Secondary | ICD-10-CM | POA: Diagnosis not present

## 2016-10-08 ENCOUNTER — Ambulatory Visit: Payer: 59

## 2016-10-28 ENCOUNTER — Encounter: Payer: 59 | Admitting: Obstetrics & Gynecology

## 2016-10-28 DIAGNOSIS — Z0289 Encounter for other administrative examinations: Secondary | ICD-10-CM

## 2016-11-19 DIAGNOSIS — D1801 Hemangioma of skin and subcutaneous tissue: Secondary | ICD-10-CM | POA: Diagnosis not present

## 2016-11-19 DIAGNOSIS — Z85828 Personal history of other malignant neoplasm of skin: Secondary | ICD-10-CM | POA: Diagnosis not present

## 2016-11-19 DIAGNOSIS — L812 Freckles: Secondary | ICD-10-CM | POA: Diagnosis not present

## 2016-12-18 DIAGNOSIS — H11153 Pinguecula, bilateral: Secondary | ICD-10-CM | POA: Diagnosis not present

## 2017-03-16 DIAGNOSIS — C50919 Malignant neoplasm of unspecified site of unspecified female breast: Secondary | ICD-10-CM

## 2017-03-16 DIAGNOSIS — Z923 Personal history of irradiation: Secondary | ICD-10-CM

## 2017-03-16 HISTORY — DX: Malignant neoplasm of unspecified site of unspecified female breast: C50.919

## 2017-03-16 HISTORY — DX: Personal history of irradiation: Z92.3

## 2017-05-13 ENCOUNTER — Encounter: Payer: Self-pay | Admitting: Obstetrics & Gynecology

## 2017-05-13 ENCOUNTER — Ambulatory Visit (INDEPENDENT_AMBULATORY_CARE_PROVIDER_SITE_OTHER): Payer: 59 | Admitting: Obstetrics & Gynecology

## 2017-05-13 VITALS — BP 160/90 | Ht 65.5 in | Wt 193.0 lb

## 2017-05-13 DIAGNOSIS — Z780 Asymptomatic menopausal state: Secondary | ICD-10-CM

## 2017-05-13 DIAGNOSIS — C73 Malignant neoplasm of thyroid gland: Secondary | ICD-10-CM

## 2017-05-13 DIAGNOSIS — N393 Stress incontinence (female) (male): Secondary | ICD-10-CM

## 2017-05-13 DIAGNOSIS — Z90710 Acquired absence of both cervix and uterus: Secondary | ICD-10-CM

## 2017-05-13 DIAGNOSIS — Z13820 Encounter for screening for osteoporosis: Secondary | ICD-10-CM | POA: Diagnosis not present

## 2017-05-13 DIAGNOSIS — Z01411 Encounter for gynecological examination (general) (routine) with abnormal findings: Secondary | ICD-10-CM

## 2017-05-13 DIAGNOSIS — I10 Essential (primary) hypertension: Secondary | ICD-10-CM

## 2017-05-13 NOTE — Progress Notes (Addendum)
Bethany [name] June 18, 1960 [id number]   History:    57 y.o. G3P3L3 Married  RP:  Established patient presenting for annual gyn exam   HPI: Status post hysterectomy.  Doing well without hormone replacement therapy.  No pelvic pain.  Bowel movements normal.  Urine normal except for stress urinary incontinence.  Patient not doing Kegel exercises currently.  Breasts normal.  Body mass index 31.63.  Blood pressure high today, will see her family physician urgently.  History of thyroidectomy for thyroid cancer.  Past medical history,surgical history, family history and social history were all reviewed and documented in the EPIC chart.  Gynecologic History No LMP recorded. Patient has had a hysterectomy. Contraception: status post hysterectomy Last Pap: 2011. Results were: Negative Last mammogram: 03/2015. Results were: Negative Bone Density: Osteopenia Rt femoral neck with T-Score -1.4 Colonoscopy: 2016  Obstetric History OB History  Gravida Para Term Preterm AB Living  3 3 3     3   SAB TAB Ectopic Multiple Live Births               # Outcome Date GA Lbr Len/2nd Weight Sex Delivery Anes PTL Lv  3 Term           2 Term           1 Term                ROS: A ROS was performed and pertinent positives and negatives are included in the history.  GENERAL: No fevers or chills. HEENT: No change in vision, no earache, sore throat or sinus congestion. NECK: No pain or stiffness. CARDIOVASCULAR: No chest pain or pressure. No palpitations. PULMONARY: No shortness of breath, cough or wheeze. GASTROINTESTINAL: No abdominal pain, nausea, vomiting or diarrhea, melena or bright red blood per rectum. GENITOURINARY: No urinary frequency, urgency, hesitancy or dysuria. MUSCULOSKELETAL: No joint or muscle pain, no back pain, no recent trauma. DERMATOLOGIC: No rash, no itching, no lesions. ENDOCRINE: No polyuria, polydipsia, no heat or cold intolerance. No recent change in weight. HEMATOLOGICAL: No anemia  or easy bruising or bleeding. NEUROLOGIC: No headache, seizures, numbness, tingling or weakness. PSYCHIATRIC: No depression, no loss of interest in normal activity or change in sleep pattern.     Exam:   BP (!) 154/92   Ht 5' 5.5" (1.664 m)   Wt 193 lb (87.5 kg)   BMI 31.63 kg/m   Body mass index is 31.63 kg/m.  General appearance : Well developed well nourished female. No acute distress HEENT: Eyes: no retinal hemorrhage or exudates,  Neck supple, trachea midline, no carotid bruits, no thyroidmegaly Lungs: Clear to auscultation, no rhonchi or wheezes, or rib retractions  Heart: Regular rate and rhythm, no murmurs or gallops Breast:Examined in sitting and supine position were symmetrical in appearance, no palpable masses or tenderness,  no skin retraction, no nipple inversion, no nipple discharge, no skin discoloration, no axillary or supraclavicular lymphadenopathy Abdomen: no palpable masses or tenderness, no rebound or guarding Extremities: no edema or skin discoloration or tenderness  Pelvic: Vulva: Normal             Vagina: No gross lesions or discharge.  Pap reflex done.  Cervix/Uterus absent  Adnexa  Without masses or tenderness  Anus: Normal   Assessment/Plan:  57 y.o. female for annual exam   1. Encounter for gynecological examination with abnormal finding Gynecologic exam status post total hysterectomy.  Pap reflex done on vaginal vault.  Breast exam normal.  Will  schedule screening mammogram.  Health labs with family physician.  2. History of total hysterectomy  3. Menopause present Well on no HRT.  S/P Hysterectomy.  4. Screening for osteoporosis Osteopenia on Bone Density 03/2015.  Vit D supplement, Ca++ rich nutrition and weight bearing physical activity recommended. - DG Bone Density; Future  5. Chronic hypertension Recommend urgent evaluation and management by Family MD.  Low salt diet.   6. SUI (stress urinary incontinence, female) Moderate stress  urinary incontinence.  Patient has done Kegel exercises for many years.  Decision to refer to physical therapy to reinforce the pelvic floor.  If that is not sufficient will then refer to a urologist to discuss sling procedure.  Patient also has some urinary urgency.  Recommend decreasing caffeine and regular emptying of the bladder to avoid overfilling.  7. Thyroid cancer ([place]) History of thyroidectomy.  Treated on Synthroid.  Previously a patient of Dr. Chalmers Cater and patient would like to be referred back to her.  Counseling on above issues more than 50% for 10 minutes.  [name] MD, 2:21 PM 05/13/2017

## 2017-05-13 NOTE — Patient Instructions (Signed)
1. Encounter for gynecological examination with abnormal finding Gynecologic exam status post total hysterectomy.  Pap reflex done on vaginal vault.  Breast exam normal.  Will schedule screening mammogram.  Health labs with family physician.  2. History of total hysterectomy  3. Menopause present Well on no HRT.  S/P Hysterectomy.  4. Screening for osteoporosis Osteopenia on Bone Density 03/2015.  Vit D supplement, Ca++ rich nutrition and weight bearing physical activity recommended. - DG Bone Density; Future  5. Chronic hypertension Recommend urgent evaluation and management by Family MD.  Low salt diet.   6. SUI (stress urinary incontinence, female) Moderate stress urinary incontinence.  Patient has done Kegel exercises for many years.  Decision to refer to physical therapy to reinforce the pelvic floor.  If that is not sufficient will then refer to a urologist to discuss sling procedure.  Patient also has some urinary urgency.  Recommend decreasing caffeine and regular emptying of the bladder to avoid overfilling.  7. Thyroid cancer ([place]) History of thyroidectomy.  Treated on Synthroid.  Previously a patient of Dr. Chalmers Cater and patient would like to be referred back to her.  Bethany [name], it was a pleasure meeting you today!  I will inform you of your results as soon as they are available.   Health Maintenance for Postmenopausal Women Menopause is a normal process in which your reproductive ability comes to an end. This process happens gradually over a span of months to years, usually between the ages of 52 and 73. Menopause is complete when you have missed 12 consecutive menstrual periods. It is important to talk with your health care provider about some of the most common conditions that affect postmenopausal women, such as heart disease, cancer, and bone loss (osteoporosis). Adopting a healthy lifestyle and getting preventive care can help to promote your health and wellness. Those actions can  also lower your chances of developing some of these common conditions. What should I know about menopause? During menopause, you may experience a number of symptoms, such as:  Moderate-to-severe hot flashes.  Night sweats.  Decrease in sex drive.  Mood swings.  Headaches.  Tiredness.  Irritability.  Memory problems.  Insomnia.  Choosing to treat or not to treat menopausal changes is an individual decision that you make with your health care provider. What should I know about hormone replacement therapy and supplements? Hormone therapy products are effective for treating symptoms that are associated with menopause, such as hot flashes and night sweats. Hormone replacement carries certain risks, especially as you become older. If you are thinking about using estrogen or estrogen with progestin treatments, discuss the benefits and risks with your health care provider. What should I know about heart disease and stroke? Heart disease, heart attack, and stroke become more likely as you age. This may be due, in part, to the hormonal changes that your body experiences during menopause. These can affect how your body processes dietary fats, triglycerides, and cholesterol. Heart attack and stroke are both medical emergencies. There are many things that you can do to help prevent heart disease and stroke:  Have your blood pressure checked at least every 1-2 years. High blood pressure causes heart disease and increases the risk of stroke.  If you are 68-25 years old, ask your health care provider if you should take aspirin to prevent a heart attack or a stroke.  Do not use any tobacco products, including cigarettes, chewing tobacco, or electronic cigarettes. If you need help quitting, ask your health care  provider.  It is important to eat a healthy diet and maintain a healthy weight. ? Be sure to include plenty of vegetables, fruits, low-fat dairy products, and lean protein. ? Avoid eating  foods that are high in solid fats, added sugars, or salt (sodium).  Get regular exercise. This is one of the most important things that you can do for your health. ? Try to exercise for at least 150 minutes each week. The type of exercise that you do should increase your heart rate and make you sweat. This is known as moderate-intensity exercise. ? Try to do strengthening exercises at least twice each week. Do these in addition to the moderate-intensity exercise.  Know your numbers.Ask your health care provider to check your cholesterol and your blood glucose. Continue to have your blood tested as directed by your health care provider.  What should I know about cancer screening? There are several types of cancer. Take the following steps to reduce your risk and to catch any cancer development as early as possible. Breast Cancer  Practice breast self-awareness. ? This means understanding how your breasts normally appear and feel. ? It also means doing regular breast self-exams. Let your health care provider know about any changes, no matter how small.  If you are 69 or older, have a clinician do a breast exam (clinical breast exam or CBE) every year. Depending on your age, family history, and medical history, it may be recommended that you also have a yearly breast X-ray (mammogram).  If you have a family history of breast cancer, talk with your health care provider about genetic screening.  If you are at high risk for breast cancer, talk with your health care provider about having an MRI and a mammogram every year.  Breast cancer (BRCA) gene test is recommended for women who have family members with BRCA-related cancers. Results of the assessment will determine the need for genetic counseling and BRCA1 and for BRCA2 testing. BRCA-related cancers include these types: ? Breast. This occurs in males or females. ? Ovarian. ? Tubal. This may also be called fallopian tube cancer. ? Cancer of the  abdominal or pelvic lining (peritoneal cancer). ? Prostate. ? Pancreatic.  Cervical, Uterine, and Ovarian Cancer Your health care provider may recommend that you be screened regularly for cancer of the pelvic organs. These include your ovaries, uterus, and vagina. This screening involves a pelvic exam, which includes checking for microscopic changes to the surface of your cervix (Pap test).  For women ages 21-65, health care providers may recommend a pelvic exam and a Pap test every three years. For women ages 38-65, they may recommend the Pap test and pelvic exam, combined with testing for human papilloma virus (HPV), every five years. Some types of HPV increase your risk of cervical cancer. Testing for HPV may also be done on women of any age who have unclear Pap test results.  Other health care providers may not recommend any screening for nonpregnant women who are considered low risk for pelvic cancer and have no symptoms. Ask your health care provider if a screening pelvic exam is right for you.  If you have had past treatment for cervical cancer or a condition that could lead to cancer, you need Pap tests and screening for cancer for at least 20 years after your treatment. If Pap tests have been discontinued for you, your risk factors (such as having a new sexual partner) need to be reassessed to determine if you should start  having screenings again. Some women have medical problems that increase the chance of getting cervical cancer. In these cases, your health care provider may recommend that you have screening and Pap tests more often.  If you have a family history of uterine cancer or ovarian cancer, talk with your health care provider about genetic screening.  If you have vaginal bleeding after reaching menopause, tell your health care provider.  There are currently no reliable tests available to screen for ovarian cancer.  Lung Cancer Lung cancer screening is recommended for adults  34-54 years old who are at high risk for lung cancer because of a history of smoking. A yearly low-dose CT scan of the lungs is recommended if you:  Currently smoke.  Have a history of at least 30 pack-years of smoking and you currently smoke or have quit within the past 15 years. A pack-year is smoking an average of one pack of cigarettes per day for one year.  Yearly screening should:  Continue until it has been 15 years since you quit.  Stop if you develop a health problem that would prevent you from having lung cancer treatment.  Colorectal Cancer  This type of cancer can be detected and can often be prevented.  Routine colorectal cancer screening usually begins at age 11 and continues through age 66.  If you have risk factors for colon cancer, your health care provider may recommend that you be screened at an earlier age.  If you have a family history of colorectal cancer, talk with your health care provider about genetic screening.  Your health care provider may also recommend using home test kits to check for hidden blood in your stool.  A small camera at the end of a tube can be used to examine your colon directly (sigmoidoscopy or colonoscopy). This is done to check for the earliest forms of colorectal cancer.  Direct examination of the colon should be repeated every 5-10 years until age 54. However, if early forms of precancerous polyps or small growths are found or if you have a family history or genetic risk for colorectal cancer, you may need to be screened more often.  Skin Cancer  Check your skin from head to toe regularly.  Monitor any moles. Be sure to tell your health care provider: ? About any new moles or changes in moles, especially if there is a change in a mole's shape or color. ? If you have a mole that is larger than the size of a pencil eraser.  If any of your family members has a history of skin cancer, especially at a young age, talk with your health  care provider about genetic screening.  Always use sunscreen. Apply sunscreen liberally and repeatedly throughout the day.  Whenever you are outside, protect yourself by wearing long sleeves, pants, a wide-brimmed hat, and sunglasses.  What should I know about osteoporosis? Osteoporosis is a condition in which bone destruction happens more quickly than new bone creation. After menopause, you may be at an increased risk for osteoporosis. To help prevent osteoporosis or the bone fractures that can happen because of osteoporosis, the following is recommended:  If you are 82-70 years old, get at least 1,000 mg of calcium and at least 600 mg of vitamin D per day.  If you are older than age 53 but younger than age 53, get at least 1,200 mg of calcium and at least 600 mg of vitamin D per day.  If you are older than  age 45, get at least 1,200 mg of calcium and at least 800 mg of vitamin D per day.  Smoking and excessive alcohol intake increase the risk of osteoporosis. Eat foods that are rich in calcium and vitamin D, and do weight-bearing exercises several times each week as directed by your health care provider. What should I know about how menopause affects my mental health? Depression may occur at any age, but it is more common as you become older. Common symptoms of depression include:  Low or sad mood.  Changes in sleep patterns.  Changes in appetite or eating patterns.  Feeling an overall lack of motivation or enjoyment of activities that you previously enjoyed.  Frequent crying spells.  Talk with your health care provider if you think that you are experiencing depression. What should I know about immunizations? It is important that you get and maintain your immunizations. These include:  Tetanus, diphtheria, and pertussis (Tdap) booster vaccine.  Influenza every year before the flu season begins.  Pneumonia vaccine.  Shingles vaccine.  Your health care provider may also  recommend other immunizations. This information is not intended to replace advice given to you by your health care provider. Make sure you discuss any questions you have with your health care provider. Document Released: 04/24/2005 Document Revised: 09/20/2015 Document Reviewed: 12/04/2014 Elsevier Interactive Patient Education  2018 [employer].

## 2017-05-14 ENCOUNTER — Telehealth: Payer: Self-pay | Admitting: *Deleted

## 2017-05-14 DIAGNOSIS — Z9889 Other specified postprocedural states: Secondary | ICD-10-CM

## 2017-05-14 DIAGNOSIS — N393 Stress incontinence (female) (male): Secondary | ICD-10-CM

## 2017-05-14 DIAGNOSIS — E890 Postprocedural hypothyroidism: Secondary | ICD-10-CM | POA: Diagnosis not present

## 2017-05-14 DIAGNOSIS — E538 Deficiency of other specified B group vitamins: Secondary | ICD-10-CM | POA: Diagnosis not present

## 2017-05-14 DIAGNOSIS — E559 Vitamin D deficiency, unspecified: Secondary | ICD-10-CM | POA: Diagnosis not present

## 2017-05-14 NOTE — Telephone Encounter (Signed)
Referral faxed to [place] office they will let me know time and date to relay to patient.   Referral placed for PT for pelvic floor reinforcement they will call pt to schedule.

## 2017-05-14 NOTE — Telephone Encounter (Signed)
-----   Message from [name], MD sent at 05/13/2017  2:42 PM EST ----- Regarding: Referrals Refer to Dr Chalmers Cater at patient's request (Currently followed by Dr Buddy Duty, but previously a patient of Dr Chalmers Cater).  S/P Thyroidectomy for Thyroid Ca.  Refer to Physical Therapy for Pelvic floor reinforcement.  Stress Urinary Incontinence.

## 2017-05-14 NOTE — Telephone Encounter (Signed)
Pt scheduled with Dr.Balan on 06/10/17 @10 :30am

## 2017-05-17 LAB — PAP IG W/ RFLX HPV ASCU

## 2017-05-20 NOTE — Telephone Encounter (Signed)
[facility] has called and not able to leave a message as voicemail is full.

## 2017-06-02 ENCOUNTER — Other Ambulatory Visit: Payer: Self-pay | Admitting: Gynecology

## 2017-06-02 DIAGNOSIS — Z13820 Encounter for screening for osteoporosis: Secondary | ICD-10-CM

## 2017-06-08 NOTE — Telephone Encounter (Signed)
Encounter will be closed Physical therapy has tried to contact pt several times and her voicemail is not set up to leave a message.

## 2017-06-28 DIAGNOSIS — Z23 Encounter for immunization: Secondary | ICD-10-CM | POA: Diagnosis not present

## 2017-06-28 DIAGNOSIS — I10 Essential (primary) hypertension: Secondary | ICD-10-CM | POA: Diagnosis not present

## 2017-06-28 DIAGNOSIS — Z0000 Encounter for general adult medical examination without abnormal findings: Secondary | ICD-10-CM | POA: Diagnosis not present

## 2017-07-28 DIAGNOSIS — I10 Essential (primary) hypertension: Secondary | ICD-10-CM | POA: Diagnosis not present

## 2017-07-28 DIAGNOSIS — C73 Malignant neoplasm of thyroid gland: Secondary | ICD-10-CM | POA: Diagnosis not present

## 2017-07-28 DIAGNOSIS — E890 Postprocedural hypothyroidism: Secondary | ICD-10-CM | POA: Diagnosis not present

## 2017-08-16 ENCOUNTER — Ambulatory Visit: Payer: 59 | Admitting: Physical Therapy

## 2017-09-01 ENCOUNTER — Ambulatory Visit: Payer: 59 | Admitting: Physical Therapy

## 2017-09-29 DIAGNOSIS — E890 Postprocedural hypothyroidism: Secondary | ICD-10-CM | POA: Diagnosis not present

## 2017-09-29 DIAGNOSIS — C73 Malignant neoplasm of thyroid gland: Secondary | ICD-10-CM | POA: Diagnosis not present

## 2017-09-29 DIAGNOSIS — I10 Essential (primary) hypertension: Secondary | ICD-10-CM | POA: Diagnosis not present

## 2017-10-20 ENCOUNTER — Ambulatory Visit: Payer: 59 | Admitting: Obstetrics & Gynecology

## 2017-10-25 ENCOUNTER — Ambulatory Visit (INDEPENDENT_AMBULATORY_CARE_PROVIDER_SITE_OTHER): Payer: 59 | Admitting: Obstetrics & Gynecology

## 2017-10-25 ENCOUNTER — Other Ambulatory Visit: Payer: Self-pay | Admitting: Obstetrics & Gynecology

## 2017-10-25 ENCOUNTER — Encounter: Payer: Self-pay | Admitting: Obstetrics & Gynecology

## 2017-10-25 ENCOUNTER — Other Ambulatory Visit: Payer: Self-pay | Admitting: Gynecology

## 2017-10-25 ENCOUNTER — Ambulatory Visit (INDEPENDENT_AMBULATORY_CARE_PROVIDER_SITE_OTHER): Payer: 59

## 2017-10-25 VITALS — BP 134/82 | Ht 66.0 in | Wt 196.4 lb

## 2017-10-25 DIAGNOSIS — M8589 Other specified disorders of bone density and structure, multiple sites: Secondary | ICD-10-CM | POA: Diagnosis not present

## 2017-10-25 DIAGNOSIS — Z6831 Body mass index (BMI) 31.0-31.9, adult: Secondary | ICD-10-CM

## 2017-10-25 DIAGNOSIS — E6609 Other obesity due to excess calories: Secondary | ICD-10-CM | POA: Diagnosis not present

## 2017-10-25 DIAGNOSIS — Z90710 Acquired absence of both cervix and uterus: Secondary | ICD-10-CM

## 2017-10-25 DIAGNOSIS — Z1231 Encounter for screening mammogram for malignant neoplasm of breast: Secondary | ICD-10-CM

## 2017-10-25 DIAGNOSIS — Z13820 Encounter for screening for osteoporosis: Secondary | ICD-10-CM

## 2017-10-25 DIAGNOSIS — N951 Menopausal and female climacteric states: Secondary | ICD-10-CM | POA: Diagnosis not present

## 2017-10-25 MED ORDER — ESTRADIOL 0.1 MG/24HR TD PTTW: 1.0000 | MEDICATED_PATCH | TRANSDERMAL | 4 refills | Status: DC

## 2017-10-25 MED ORDER — PHENTERMINE HCL 37.5 MG PO CAPS: 37.5000 mg | ORAL_CAPSULE | ORAL | 1 refills | Status: DC

## 2017-10-25 NOTE — Patient Instructions (Signed)
1. Menopausal syndrome Very symptomatic vasomotor menopausal symptoms.  No contraindication to hormone replacement therapy.  Status post total hysterectomy.  Different forms of hormone replacement therapy discussed.  Decision to start on estradiol patch.  Usage, risks and benefits reviewed thoroughly with patient.  Will start on estradiol patch 0.1 twice a week.  Prescription sent to pharmacy.  Mild increased risk of breast cancer after 10 years of use discussed with patient and increased risk of strokes with age also reviewed. Patient counseled that she has more benefits than risks at this point including the benefits of controlling her vasomotor symptoms, slowing down the bone loss as she had osteopenia on last bone density, improving skin and vaginal mucosa for sexual activity and benefits on sleep and mood.  Patient has a bone density scheduled today and a screening mammogram in 2 weeks.  Patient voiced understanding and agreement.  2. S/P total hysterectomy  3. Class 1 obesity due to excess calories without serious comorbidity with body mass index (BMI) of 31.0 to 31.9 in adult Recommend a lower calorie/carb diet such as [employer].  Recommend aerobic physical activity 5 times a week and weightlifting every 2 days.  Will start on phentermine to curb her appetite.  Blood pressure is controlled on losartan.  Patient will follow-up in 1 month for weight management.  Other orders - phentermine 37.5 MG capsule; Take 1 capsule (37.5 mg total) by mouth every morning. - estradiol (VIVELLE-DOT) 0.1 MG/24HR patch; Place 1 patch (0.1 mg total) onto the skin 2 (two) times a week.  Bethany [name], it was a pleasure seeing you today!

## 2017-10-25 NOTE — Progress Notes (Signed)
    Bethany [name] 11-04-1960 [id number]        57 y.o.  G3P3L3 Married  RP: Severe hot flushes/night sweats   HPI: S/P Total Hysterectomy.  C/O worsening hot flushes with night sweats x a couple months.  Insomnia, tiredness, low moods and weight gain.  Would like to start on HRT.  BMI 31.70.  Bethany [name].  Exercising regularly.  No major depressive Sx, no suicidal ideations.  S/P Thyroidectomy.  Stable on Levothyroxine.  Thyroid labs normal 2 months ago per patient.  Followed by Dr Bethany [name].  Osteopenia on BD 03/2015.  Bone density scheduled today.  No Fam H/O Breast Ca.  Screening Mammo scheduled in 2 weeks.   OB History  Gravida Para Term Preterm AB Living  3 3 3     3   SAB TAB Ectopic Multiple Live Births               # Outcome Date GA Lbr Len/2nd Weight Sex Delivery Anes PTL Lv  3 Term           2 Term           1 Term             Past medical history,surgical history, problem list, medications, allergies, family history and social history were all reviewed and documented in the EPIC chart.   Directed ROS with pertinent positives and negatives documented in the history of present illness/assessment and plan.  Exam:  Vitals:   10/25/17 0953  BP: 134/82   Weight 196 and 6.4 Oz.  Height 5'6" BMI 31.70  General appearance:  Normal  Deferred   Assessment/Plan:  57 y.o. G3P3003   1. Menopausal syndrome Very symptomatic vasomotor menopausal symptoms.  No contraindication to hormone replacement therapy.  Status post total hysterectomy.  Different forms of hormone replacement therapy discussed.  Decision to start on estradiol patch.  Usage, risks and benefits reviewed thoroughly with patient.  Will start on estradiol patch 0.1 twice a week.  Prescription sent to pharmacy.  Mild increased risk of breast cancer after 10 years of use discussed with patient and increased risk of strokes with age also reviewed. Patient counseled that she has more benefits than risks at this  point including the benefits of controlling her vasomotor symptoms, slowing down the bone loss as she had osteopenia on last bone density, improving skin and vaginal mucosa for sexual activity and benefits on sleep and mood.  Patient has a bone density scheduled today and a screening mammogram in 2 weeks.  Patient voiced understanding and agreement.  2. S/P total hysterectomy  3. Class 1 obesity due to excess calories without serious comorbidity with body mass index (BMI) of 31.0 to 31.9 in adult Recommend a lower calorie/carb diet such as [employer].  Recommend aerobic physical activity 5 times a week and weightlifting every 2 days.  Will start on phentermine to curb her appetite.  Blood pressure is controlled on losartan.  Patient will follow-up in 1 month for weight management.  Other orders - phentermine 37.5 MG capsule; Take 1 capsule (37.5 mg total) by mouth every morning. - estradiol (VIVELLE-DOT) 0.1 MG/24HR patch; Place 1 patch (0.1 mg total) onto the skin 2 (two) times a week.  Counseling on above issues and coordination of care more than 50% for 25 minutes.  Bethany Bruins MD, 9:57 AM 10/25/2017

## 2017-10-26 ENCOUNTER — Ambulatory Visit
Admission: RE | Admit: 2017-10-26 | Discharge: 2017-10-26 | Disposition: A | Discharge status: Home / Self Care | Payer: 59 | Source: Ambulatory Visit | Attending: Obstetrics & Gynecology | Admitting: Obstetrics & Gynecology

## 2017-10-26 DIAGNOSIS — Z1231 Encounter for screening mammogram for malignant neoplasm of breast: Secondary | ICD-10-CM | POA: Diagnosis not present

## 2017-10-26 IMAGING — MG DIGITAL SCREENING BILATERAL MAMMOGRAM WITH TOMO AND CAD
8 series · 8 of 24 positions shown · non-contrast
Comparison: Previous exam(s).

CLINICAL DATA: Screening.

EXAM:
DIGITAL SCREENING BILATERAL MAMMOGRAM WITH TOMO AND CAD

[R CC synth-2D]
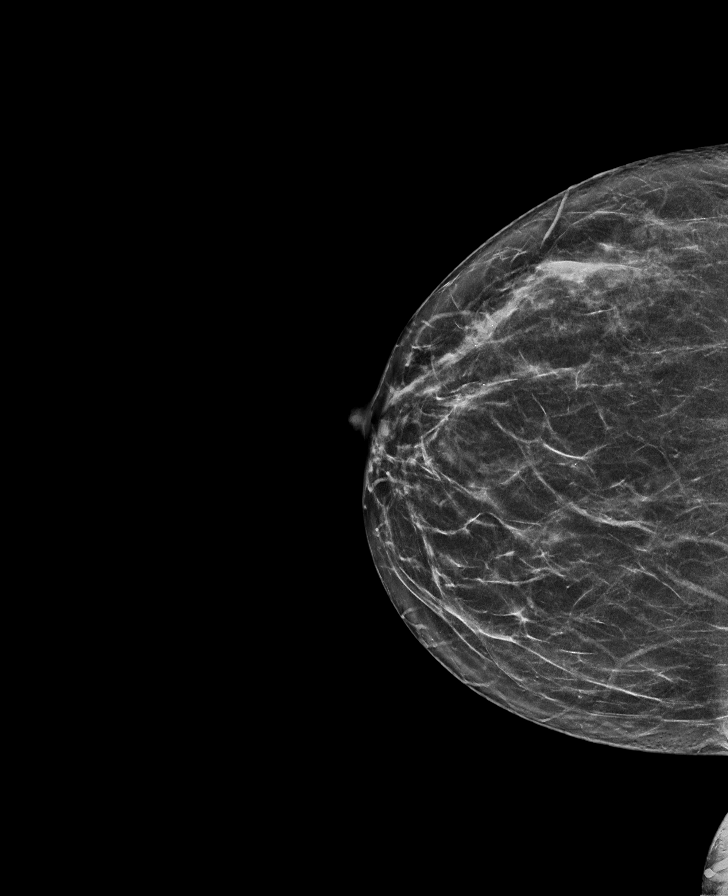

[L MLO synth-2D]
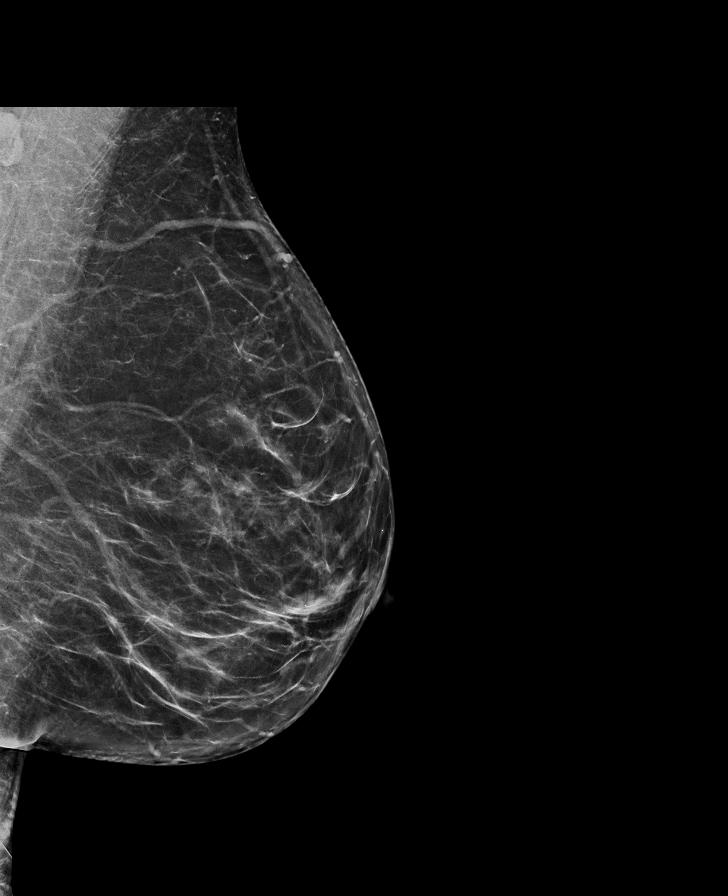

[R MLO synth-2D]
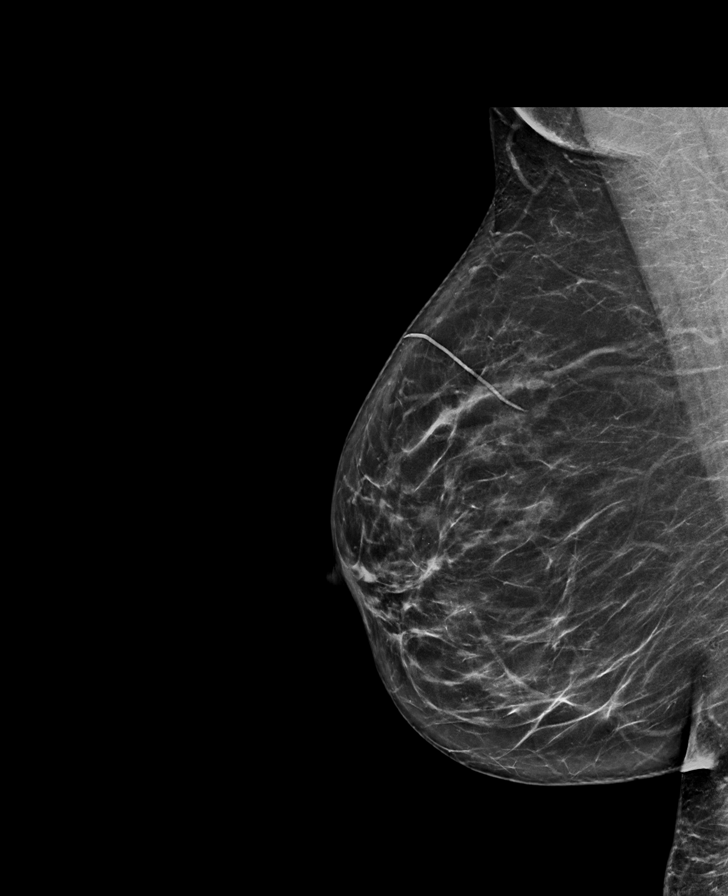

[L CC synth-2D]
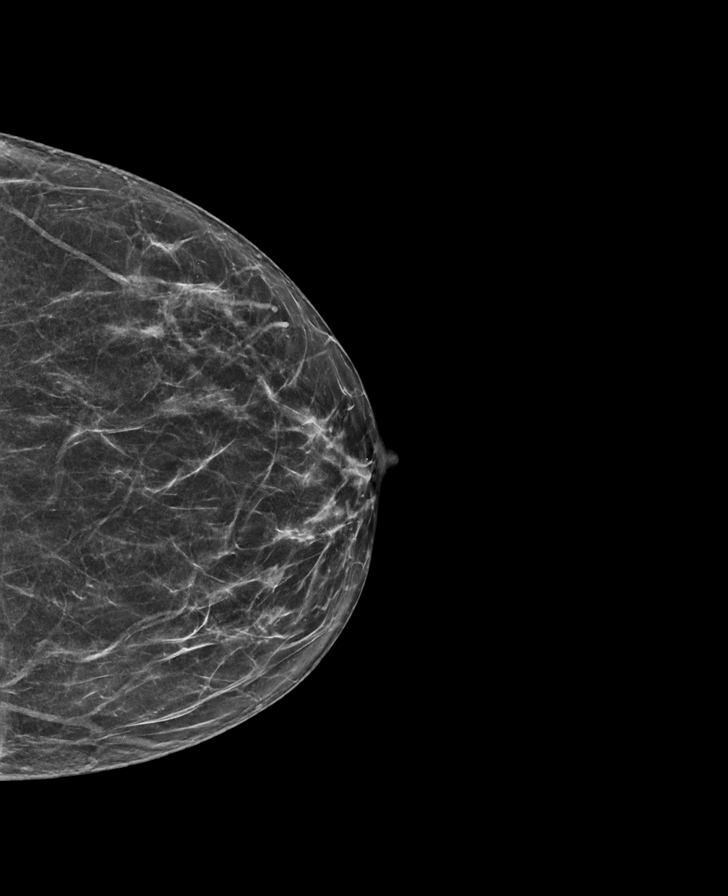

[L CC tomo · tomo slice 33/65.0]
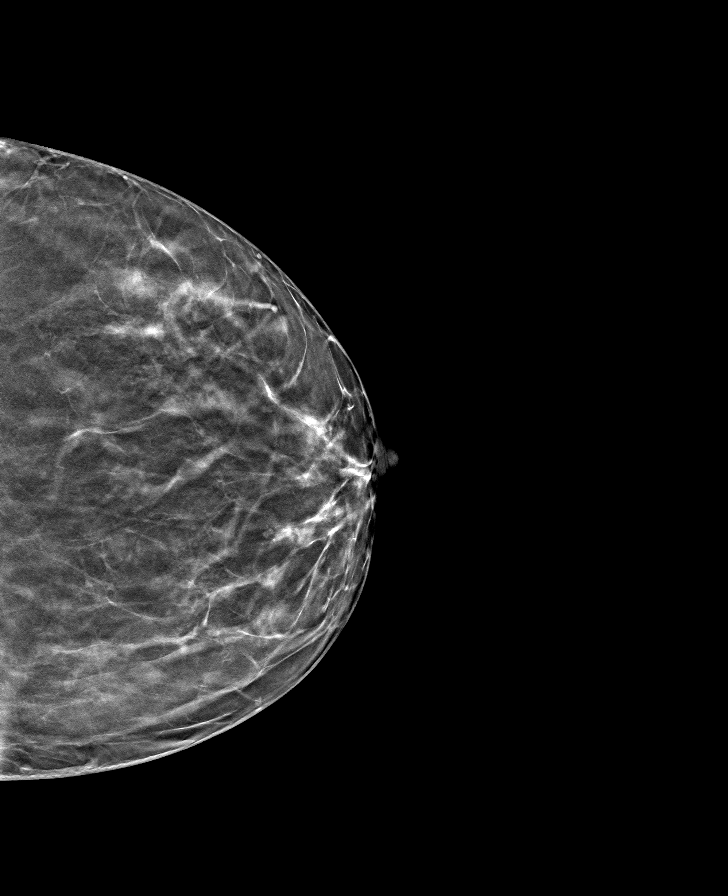

[R CC tomo · tomo slice 35/68.0]
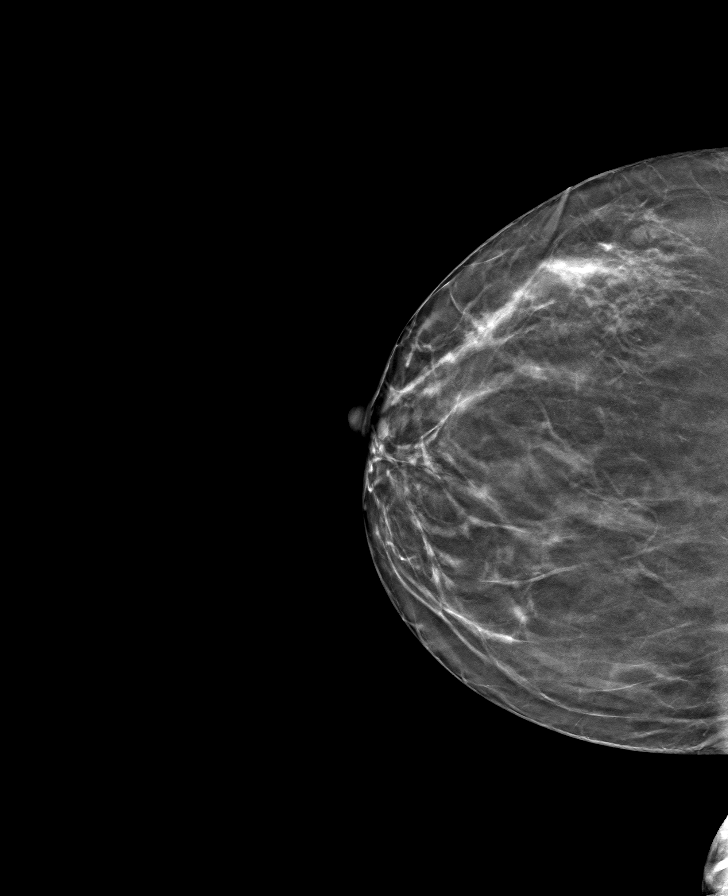

[R MLO tomo · tomo slice 39/77.0]
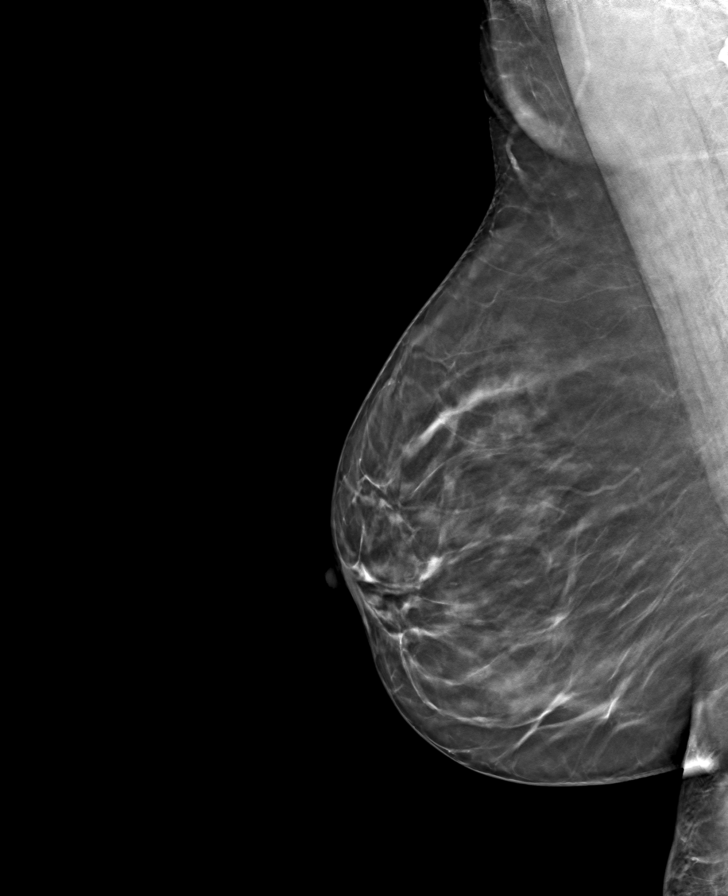

[L MLO tomo · tomo slice 40/79.0]
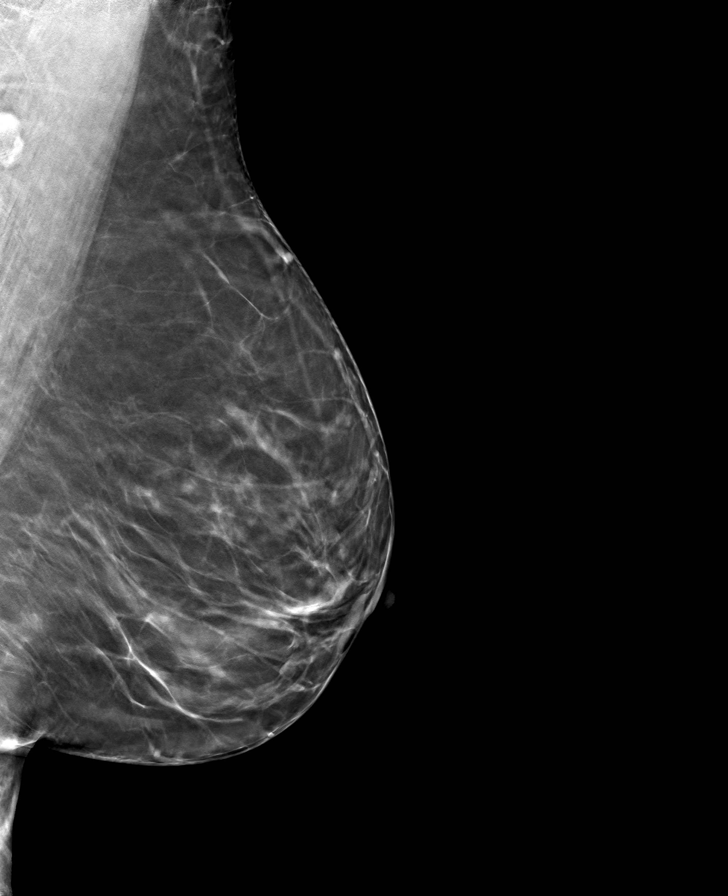

[8 of 24 positions shown; findings below may reference images not displayed]

ACR Breast Density Category b: There are scattered areas of
fibroglandular density.
FINDINGS: In the left breast, a possible asymmetry warrants further
evaluation. In the right breast, no findings suspicious for
malignancy. Images were processed with CAD.
IMPRESSION: Further evaluation is suggested for possible asymmetry in the left
breast.

RECOMMENDATION:
Diagnostic mammogram and possibly ultrasound of the left breast.
(Code:[XJ])

The patient will be contacted regarding the findings, and additional
imaging will be scheduled.

BI-RADS CATEGORY  0: Incomplete. Need additional imaging evaluation
and/or prior mammograms for comparison.

## 2017-10-27 ENCOUNTER — Other Ambulatory Visit: Payer: Self-pay | Admitting: Obstetrics & Gynecology

## 2017-10-27 DIAGNOSIS — R928 Other abnormal and inconclusive findings on diagnostic imaging of breast: Secondary | ICD-10-CM

## 2017-10-29 ENCOUNTER — Ambulatory Visit
Admission: RE | Admit: 2017-10-29 | Discharge: 2017-10-29 | Disposition: A | Discharge status: Home / Self Care | Payer: 59 | Source: Ambulatory Visit | Attending: Obstetrics & Gynecology | Admitting: Obstetrics & Gynecology

## 2017-10-29 ENCOUNTER — Other Ambulatory Visit: Payer: Self-pay | Admitting: Obstetrics & Gynecology

## 2017-10-29 DIAGNOSIS — R55 Syncope and collapse: Secondary | ICD-10-CM | POA: Diagnosis not present

## 2017-10-29 DIAGNOSIS — N6320 Unspecified lump in the left breast, unspecified quadrant: Secondary | ICD-10-CM

## 2017-10-29 DIAGNOSIS — R928 Other abnormal and inconclusive findings on diagnostic imaging of breast: Secondary | ICD-10-CM

## 2017-10-29 IMAGING — MG DIGITAL DIAGNOSTIC UNILATERAL LEFT MAMMOGRAM WITH TOMO AND CAD
4 series · 4 of 12 positions shown · non-contrast
Comparison: Previous exam(s).

ADDENDUM:
Stereotactic core needle biopsy of the right breast mass was
attempted. After the needle was placed, the patient became nauseated
and passed out. The needle was then withdrawn. The patient required
3-4 minutes to CITLALLI, but did not fully recover for nearly 30 minutes
and had 1 additional episode of syncope during this time. The
patient's blood pressure and pulse rate remained in the normal range
during this time. Her blood sugar was in the normal range. Given the
fairly extreme vasovagal episode, repeat attempt at stereotactic
biopsy is not recommended. The patient will be scheduled for
ultrasound-guided core needle biopsy of the small lesion that was
felt likely to be a correlate the mammographic abnormality. If she
cannot undergo ultrasound-guided core needle biopsy, then surgical
biopsy will be indicated.

Surgical consultation has been arranged with Dr. CITLALLI at
[REDACTED] on [DATE] for excisional biopsy
per patient request.
Pathology results reported by CITLALLI, RN on [DATE].
In the addendum section, there is an error. The stereotactic core
needle biopsy was attempted of the left breast mass, not right.
CLINICAL DATA: Screening recall for a possible asymmetry in the
left breast.
EXAM:
DIGITAL DIAGNOSTIC LEFT MAMMOGRAM WITH CAD AND TOMO
ULTRASOUND LEFT BREAST

[L CC synth-2D]
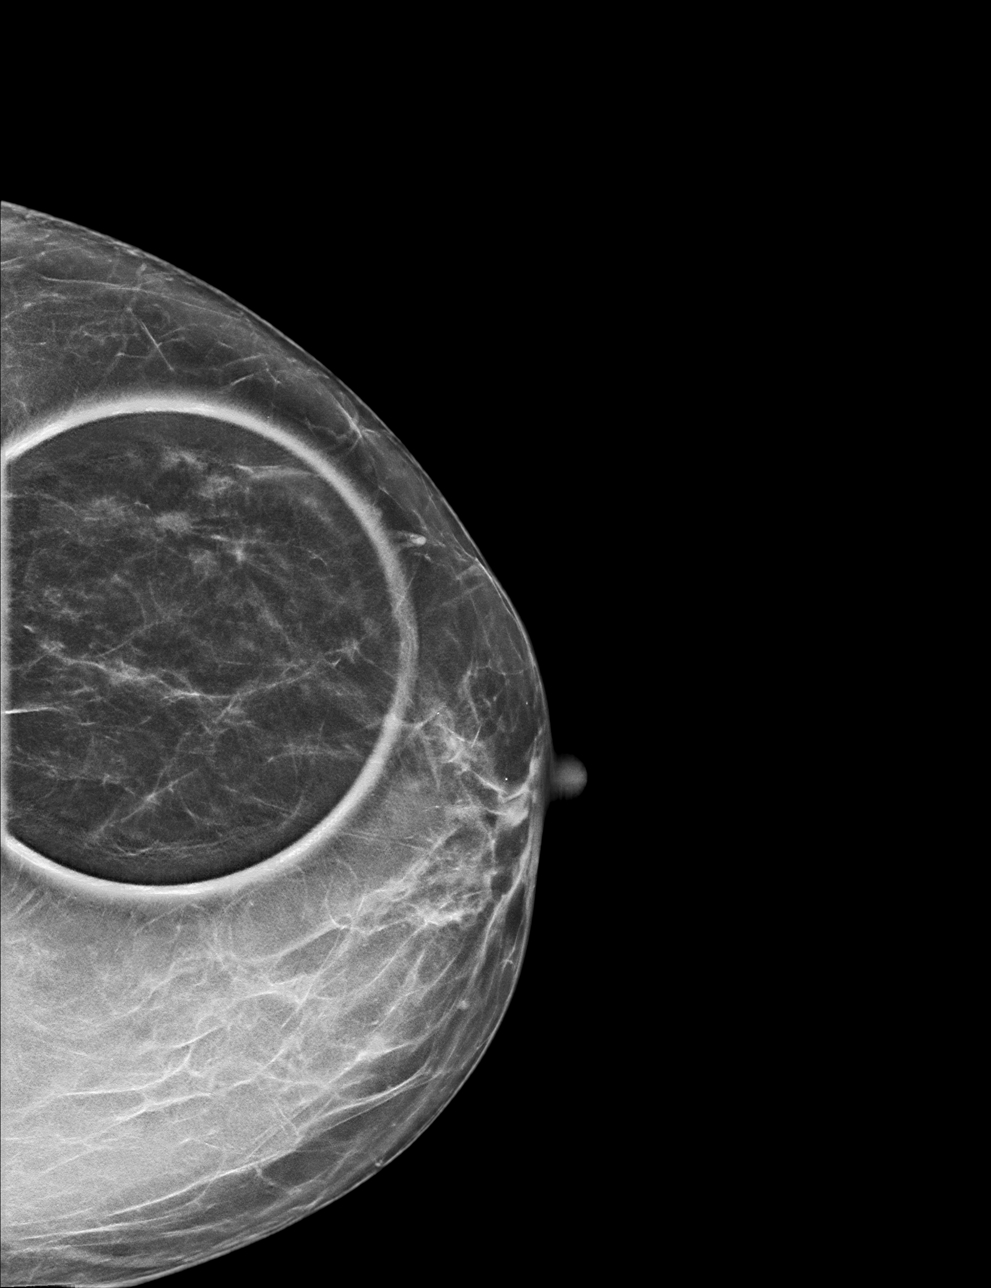

[L ML synth-2D]
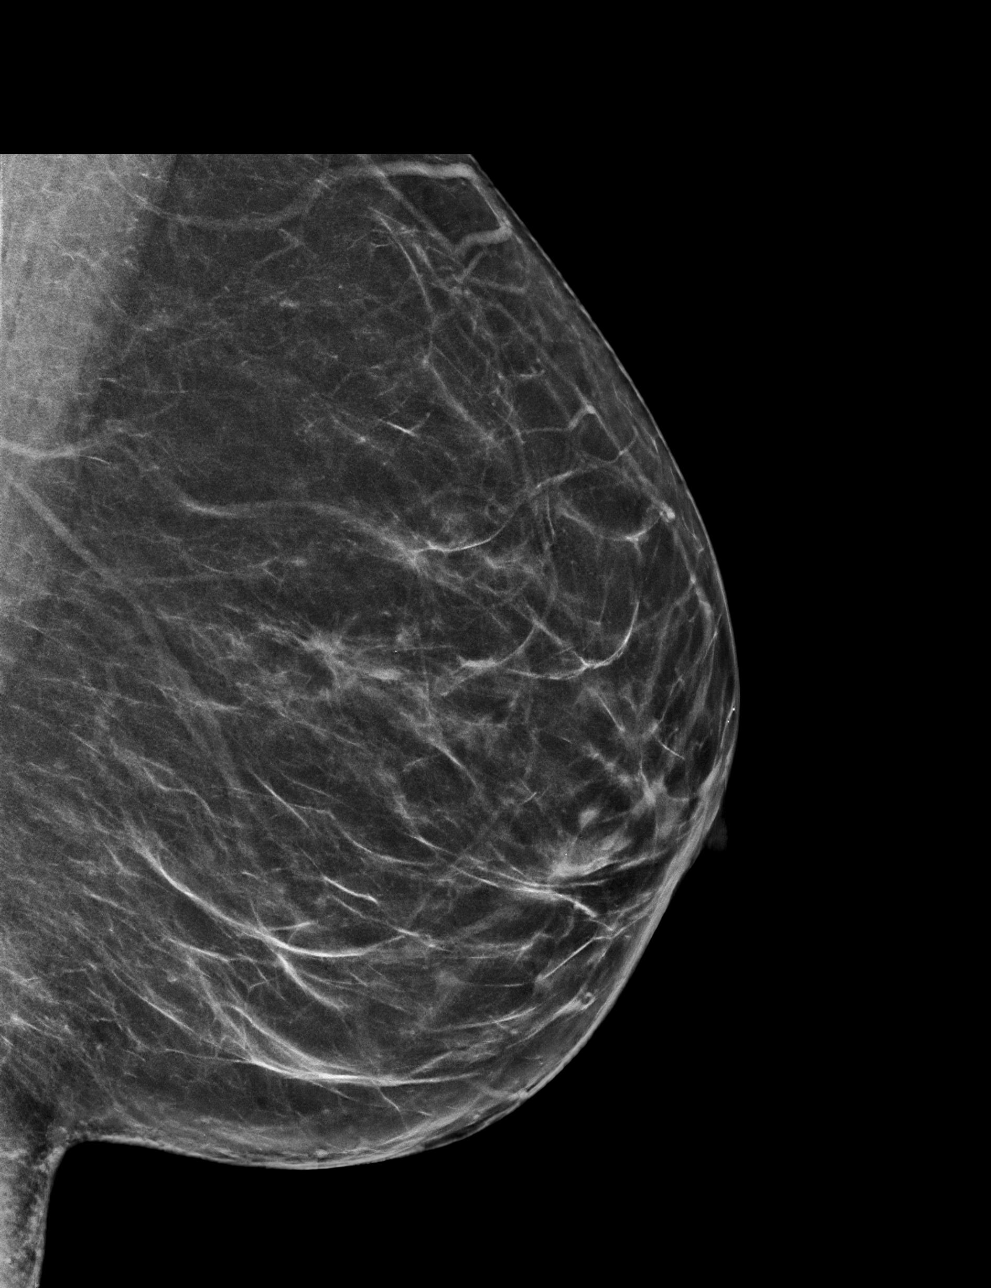

[L ML tomo · tomo slice 34/67.0]
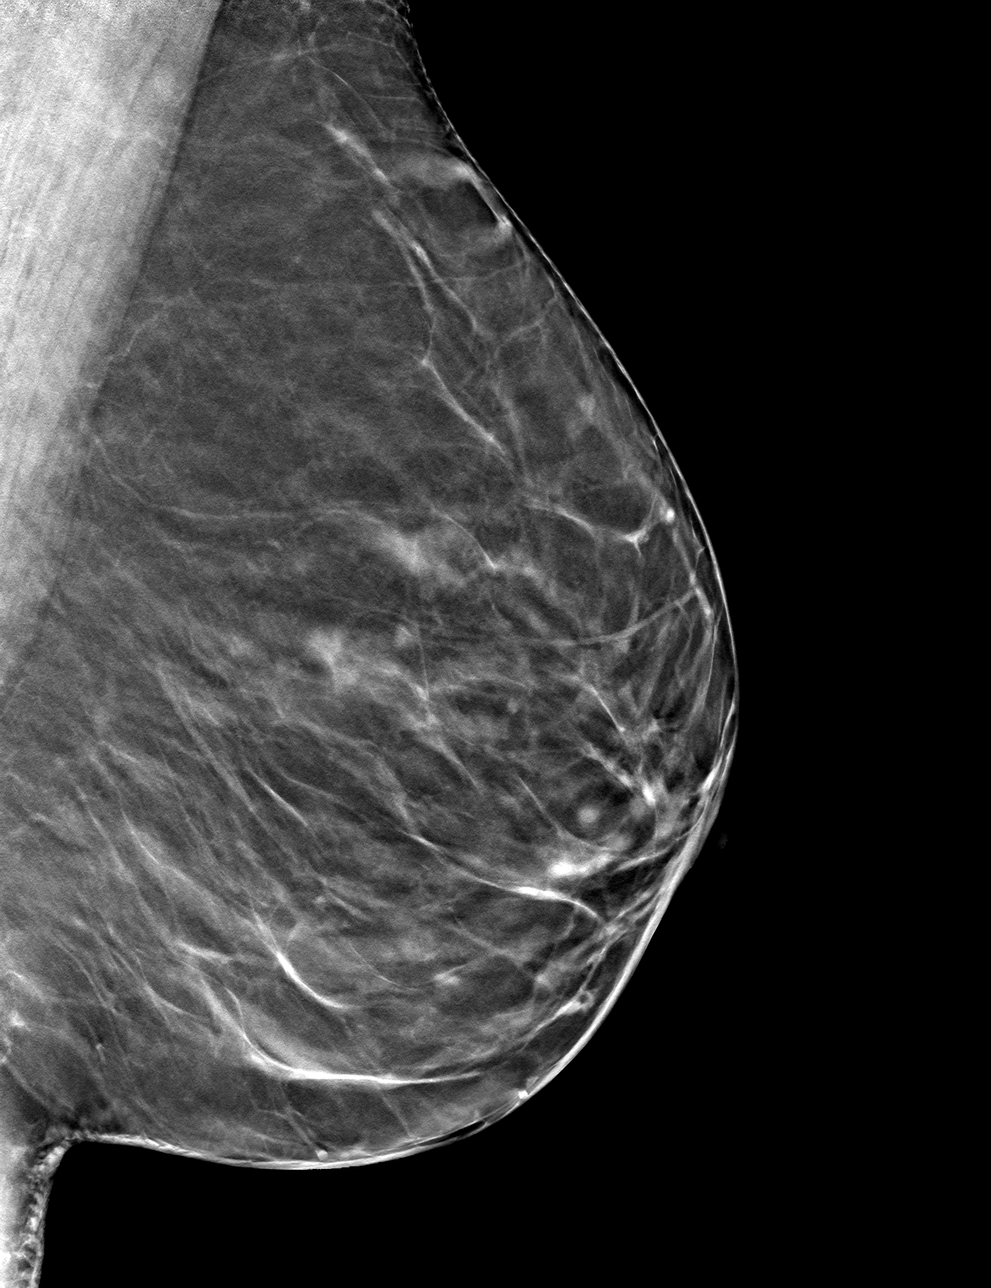

[L CC tomo · tomo slice 29/57.0]
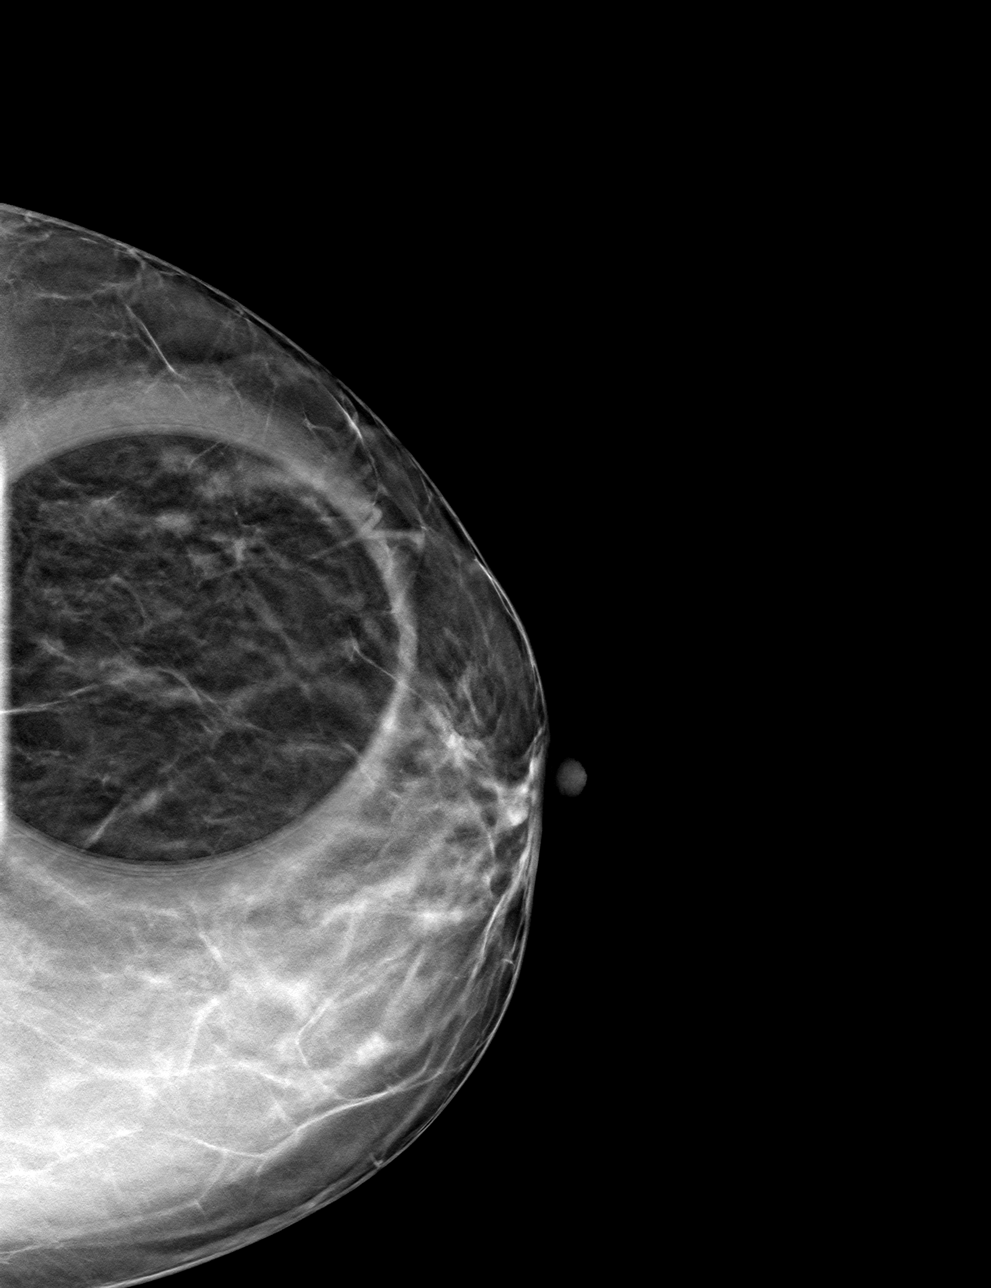

[4 of 12 positions shown; findings below may reference images not displayed]

ACR Breast Density Category b: There are scattered areas of
fibroglandular density.
FINDINGS: On the diagnostic spot-compression CC view, the asymmetry persists.
It appears more as a small spiculated mass in the lateral left
breast, middle depth. This is also evident on the MLO view. There
are no other masses.

Mammographic images were processed with CAD.

On physical exam, no mass is palpated in the lateral or upper outer
left breast.

Targeted ultrasound is performed, showing a vague hypoechoic lesion,
consistent with a mass, in the left breast at the 2:30 o'clock
position, 4 cm from the nipple, middle depth, measuring 5 x 3 x 4
mm. This likely corresponds to the mammographic abnormality, but is
not definitive.

Sonographic evaluation of the left axilla shows no enlarged or
abnormal lymph nodes.
IMPRESSION: 1. Small spiculated mass in the lateral left breast suspicious for
breast carcinoma. There is a 5 mm hypoechoic the lesion in the 2:30
o'clock position of the left breast on sonography that likely
reflects the same abnormality. However, the mammography is more
definitive. No evidence of left axillary adenopathy.

RECOMMENDATION:
1. Stereotactic core needle biopsy of the small spiculated left
breast mass.

I have discussed the findings and recommendations with the patient.
Results were also provided in writing at the conclusion of the
visit. If applicable, a reminder letter will be sent to the patient
regarding the next appointment.

BI-RADS CATEGORY  4: Suspicious.

## 2017-11-01 ENCOUNTER — Telehealth: Payer: Self-pay | Admitting: *Deleted

## 2017-11-01 ENCOUNTER — Encounter: Payer: Self-pay | Admitting: Gynecology

## 2017-11-01 DIAGNOSIS — Z9889 Other specified postprocedural states: Secondary | ICD-10-CM

## 2017-11-01 NOTE — Telephone Encounter (Signed)
Referral placed at [facility] surgery the will call to schedule.

## 2017-11-01 NOTE — Telephone Encounter (Signed)
She needs to see a general surgeon and how to proceed with the breast Bx will be discussed and decided with the surgeon who will perform it.

## 2017-11-01 NOTE — Telephone Encounter (Addendum)
Patient called reports needs a breast biopsy at the breast center, but unable to have this done because patient "lose consciousness" has tried x 3 times. She asked if option to see a surgery and maybe be put to sleep or if medication can be prescribed to help with the process.  Patient said she doesn't think she can have biopsy done. Please advise

## 2017-11-08 ENCOUNTER — Ambulatory Visit: Payer: Self-pay | Admitting: Surgery

## 2017-11-08 DIAGNOSIS — N6320 Unspecified lump in the left breast, unspecified quadrant: Secondary | ICD-10-CM

## 2017-11-08 NOTE — H&P (View-Only) (Signed)
Bethany [name] Documented: 11/08/2017 3:15 PM Location: [facility] Patient #: [id number] DOB: 08-20-1960 Married / Language: Bethany [name] / Race: White Female  History of Present Illness Bethany [name] A. Bethany Virgen [name]; 11/08/2017 4:02 PM) Patient words: Pt sent at the request of Dr Bethany [name] for left breast mass 5 mm UOQ detected by US/ and mammogram  Pt denies hx of mass, pain or discharge no family hx of breast cancer Unable to tolerate core bx due to syncope feels well today with no hx of CP, dizziness or syncopal episode in the past                   ADDENDUM REPORT: 11/02/2017 09:09 ADDENDUM: Surgical consultation has been arranged with Dr. Erroll [name] at [facility] on November 08, 2017 for excisional biopsy per patient request. Pathology results reported by Bethany Messier, RN on 11/02/2017. Electronically Signed By: Bethany [name] M.D. On: 11/02/2017 09:09 Addended by Bethany Manes, [name] on 11/02/2017 9:22 AM ADDENDUM REPORT: 11/05/2017 13:51 ADDENDUM: Stereotactic core needle biopsy of the right breast mass was attempted. After the needle was placed, the patient became nauseated and passed out. The needle was then withdrawn. The patient required 3-4 minutes to wake, but did not fully recover for nearly 30 minutes and had 1 additional episode of syncope during this time. The patient's blood pressure and pulse rate remained in the normal range during this time. Her blood sugar was in the normal range. Given the fairly extreme vasovagal episode, repeat attempt at stereotactic biopsy is not recommended. The patient will be scheduled for ultrasound-guided core needle biopsy of the small lesion that was felt likely to be a correlate the mammographic abnormality. If she cannot undergo ultrasound-guided core needle biopsy, then surgical biopsy will be indicated. Electronically Signed By: Bethany [name] M.D. On: 10/21/2017 13:51 Addended by Bethany Manes,  [name] on 10/22/2017 1:54 PM  Study Result CLINICAL DATA: Screening recall for a possible asymmetry in the left breast. EXAM: DIGITAL DIAGNOSTIC LEFT MAMMOGRAM WITH CAD AND TOMO ULTRASOUND LEFT BREAST COMPARISON: Previous exam(s). ACR Breast Density Category b: There are scattered areas of fibroglandular density. FINDINGS: On the diagnostic spot-compression CC view, the asymmetry persists. It appears more as a small spiculated mass in the lateral left breast, middle depth. This is also evident on the MLO view. There are no other masses. Mammographic images were processed with CAD. On physical exam, no mass is palpated in the lateral or upper outer left breast. Targeted ultrasound is performed, showing a vague hypoechoic lesion, consistent with a mass, in the left breast at the 2:30 o'clock position, 4 cm from the nipple, middle depth, measuring 5 x 3 x 4 mm. This likely corresponds to the mammographic abnormality, but is not definitive. Sonographic evaluation of the left axilla shows no enlarged or abnormal lymph nodes. IMPRESSION: 1. Small spiculated mass in the lateral left breast suspicious for breast carcinoma. There is a 5 mm hypoechoic the lesion in the 2:30 o'clock position of the left breast on sonography that likely reflects the same abnormality. However, the mammography is more definitive. No evidence of left axillary adenopathy. RECOMMENDATION: 1. Stereotactic core needle biopsy of the small spiculated left breast mass. I have discussed the findings and recommendations with the patient. Results were also provided in writing at the conclusion of the visit. If applicable, a reminder letter will be sent to the patient regarding the next appointment. BI-RADS CATEGORY 4: Suspicious. Electronically Signed: By: Bethany [name] M.D. On: 11/06/2017 09:57  Result  History  MM DIAG BREAST TOMO UNI LEFT (Order #[id number]) on 11/02/2017 - Order Result History Report - Result Edited  <[email or web address]>  MM 3D SCREEN BREAST BILATERAL (Order [id number]) Study Result  CLINICAL DATA: Screening. EXAM: DIGITAL SCREENING BILATERAL MAMMOGRAM WITH TOMO AND CAD COMPARISON: Previous exam(s). ACR Breast Density Category b: There are scattered areas of fibroglandular density. FINDINGS: In the left breast, a possible asymmetry warrants further evaluation. In the right breast, no findings suspicious for malignancy. Images were processed with CAD. IMPRESSION: Further evaluation is suggested for possible asymmetry in the left breast. RECOMMENDATION: Diagnostic mammogram and possibly ultrasound of the left breast. (Code:FI-L-56M) The patient will be contacted regarding the findings, and additional imaging will be scheduled. BI-RADS CATEGORY 0: Incomplete. Need additional imaging evaluation and/or prior mammograms for comparison. Electronically Signed By: Bethany [name] M.D. On: 10/26/2017 11:50     1. Scar - Noted on 10/26/2017  Location: 10 o'clock Side: right  Documented by Bethany [name] on 11/08/2017  Original Order  Ordered On Ordered By 10/27/2017 9:18 AM <[email or web address]> Bethany [name]    External Result Report  External Result Report <[email or web address]>.  The patient is a 57 year old female.   Allergies Bethany [name]; 11/08/2017 3:15 PM) No Known Drug Allergies [11/08/2017]: Allergies Reconciled  Medication History Bethany [name]; 11/08/2017 3:17 PM) Phentermine HCl (37.5MG  Capsule, Oral) Active. Levothyroxine Sodium (137MCG Tablet, Oral) Active. Estradiol (0.1MG /24HR Patch TW, Transdermal) Active. Vitamin D (Ergocalciferol) (50000UNIT Capsule, Oral) Active. Medications Reconciled     Review of Systems (Bethany [name]; 11/08/2017 4:05 PM) All other systems negative  Vitals Bethany [name]; 11/08/2017 3:18 PM) 11/08/2017 3:17 PM Weight: 187.8 lb Height: 67in Height was reported by  patient. Body Surface Area: 1.97 m Body Mass Index: 29.41 kg/m  Temp.: 98.65F(Oral)  Pulse: 106 (Regular)  BP: 136/92 (Sitting, Left Arm, Standard)      Physical Exam (Bethany [name]; 11/08/2017 4:02 PM)  General Mental Status-Alert. General Appearance-Consistent with stated age. Hydration-Well hydrated. Voice-Normal.  Head and Neck Head-normocephalic, atraumatic with no lesions or palpable masses. Trachea-midline. Thyroid Gland Characteristics - normal size and consistency.  Chest and Lung Exam Chest and lung exam reveals -quiet, even and easy respiratory effort with no use of accessory muscles and on auscultation, normal breath sounds, no adventitious sounds and normal vocal resonance. Inspection Chest Wall - Normal. Back - normal.  Breast Breast - Left-Symmetric, Non Tender, No Biopsy scars, no Dimpling, No Inflammation, No Lumpectomy scars, No Mastectomy scars, No Peau d' Orange. Breast - Right-Symmetric, Non Tender, No Biopsy scars, no Dimpling, No Inflammation, No Lumpectomy scars, No Mastectomy scars, No Peau d' Orange. Breast Lump-No Palpable Breast Mass.  Cardiovascular Cardiovascular examination reveals -normal heart sounds, regular rate and rhythm with no murmurs and normal pedal pulses bilaterally.  Neurologic Neurologic evaluation reveals -alert and oriented x 3 with no impairment of recent or remote memory. Mental Status-Normal.  Lymphatic Head & Neck  General Head & Neck Lymphatics: Bilateral - Description - Normal. Axillary  General Axillary Region: Bilateral - Description - Normal. Tenderness - Non Tender.    Assessment & Plan (Airika Alkhatib A. Petrina Melby [name]; 11/08/2017 4:03 PM)  LEFT BREAST MASS (N63.20) Impression: unable to tolerate core biopsy due to syncopal episode discussed the need to localize the lesion recommend left breast wire localized lumpectomy Risk of lumpectomy include bleeding, infection, seroma,  more surgery, use of seed/wire, wound care, cosmetic deformity and the need for other treatments, death , blood clots, death. Pt agrees to proceed.  Current Plans You are being scheduled for surgery- Our schedulers will call you.  You should hear from our office's scheduling department within 5 working days about the location, date, and time of surgery. We try to make accommodations for patient's preferences in scheduling surgery, but sometimes the OR schedule or the surgeon's schedule prevents [place] from making those accommodations.  If you have not heard from our office [phone]) in 5 working days, call the office and ask for your surgeon's nurse.  If you have other questions about your diagnosis, plan, or surgery, call the office and ask for your surgeon's nurse.  Pt Education - [facility] Breast Biopsy HCI: discussed with patient and provided information.

## 2017-11-08 NOTE — H&P (Signed)
Bethany [name] Documented: 11/08/2017 3:15 PM Location: [facility] Patient #: [id number] DOB: 02-28-1961 Married / Language: Bethany [name] / Race: White Female  History of Present Illness Bethany Moores A. Yahira Timberman [name]; 11/08/2017 4:02 PM) Patient words: Pt sent at the request of Bethany [name] for left breast mass 5 mm UOQ detected by US/ and mammogram  Pt denies hx of mass, pain or discharge no family hx of breast cancer Unable to tolerate core bx due to syncope feels well today with no hx of CP, dizziness or syncopal episode in the past                   ADDENDUM REPORT: 11/02/2017 09:09 ADDENDUM: Surgical consultation has been arranged with Bethany [name] at [facility] on November 08, 2017 for excisional biopsy per patient request. Pathology results reported by Bethany Messier, RN on 11/02/2017. Electronically Signed By: Bethany [name] M.D. On: 11/02/2017 09:09 Addended by Bethany Manes, [name] on 11/02/2017 9:22 AM ADDENDUM REPORT: 10/23/2017 13:51 ADDENDUM: Stereotactic core needle biopsy of the right breast mass was attempted. After the needle was placed, the patient became nauseated and passed out. The needle was then withdrawn. The patient required 3-4 minutes to wake, but did not fully recover for nearly 30 minutes and had 1 additional episode of syncope during this time. The patient's blood pressure and pulse rate remained in the normal range during this time. Her blood sugar was in the normal range. Given the fairly extreme vasovagal episode, repeat attempt at stereotactic biopsy is not recommended. The patient will be scheduled for ultrasound-guided core needle biopsy of the small lesion that was felt likely to be a correlate the mammographic abnormality. If she cannot undergo ultrasound-guided core needle biopsy, then surgical biopsy will be indicated. Electronically Signed By: Bethany [name] M.D. On: 10/26/2017 13:51 Addended by Bethany Manes,  [name] on 10/15/2017 1:54 PM  Study Result CLINICAL DATA: Screening recall for a possible asymmetry in the left breast. EXAM: DIGITAL DIAGNOSTIC LEFT MAMMOGRAM WITH CAD AND TOMO ULTRASOUND LEFT BREAST COMPARISON: Previous exam(s). ACR Breast Density Category b: There are scattered areas of fibroglandular density. FINDINGS: On the diagnostic spot-compression CC view, the asymmetry persists. It appears more as a small spiculated mass in the lateral left breast, middle depth. This is also evident on the MLO view. There are no other masses. Mammographic images were processed with CAD. On physical exam, no mass is palpated in the lateral or upper outer left breast. Targeted ultrasound is performed, showing a vague hypoechoic lesion, consistent with a mass, in the left breast at the 2:30 o'clock position, 4 cm from the nipple, middle depth, measuring 5 x 3 x 4 mm. This likely corresponds to the mammographic abnormality, but is not definitive. Sonographic evaluation of the left axilla shows no enlarged or abnormal lymph nodes. IMPRESSION: 1. Small spiculated mass in the lateral left breast suspicious for breast carcinoma. There is a 5 mm hypoechoic the lesion in the 2:30 o'clock position of the left breast on sonography that likely reflects the same abnormality. However, the mammography is more definitive. No evidence of left axillary adenopathy. RECOMMENDATION: 1. Stereotactic core needle biopsy of the small spiculated left breast mass. I have discussed the findings and recommendations with the patient. Results were also provided in writing at the conclusion of the visit. If applicable, a reminder letter will be sent to the patient regarding the next appointment. BI-RADS CATEGORY 4: Suspicious. Electronically Signed: By: Bethany [name] M.D. On: 10/28/2017 09:57  Result  History  MM DIAG BREAST TOMO UNI LEFT (Order #[id number]) on 11/02/2017 - Order Result History Report - Result Edited  <[email or web address]>  MM 3D SCREEN BREAST BILATERAL (Order [id number]) Study Result  CLINICAL DATA: Screening. EXAM: DIGITAL SCREENING BILATERAL MAMMOGRAM WITH TOMO AND CAD COMPARISON: Previous exam(s). ACR Breast Density Category b: There are scattered areas of fibroglandular density. FINDINGS: In the left breast, a possible asymmetry warrants further evaluation. In the right breast, no findings suspicious for malignancy. Images were processed with CAD. IMPRESSION: Further evaluation is suggested for possible asymmetry in the left breast. RECOMMENDATION: Diagnostic mammogram and possibly ultrasound of the left breast. (Code:FI-L-78M) The patient will be contacted regarding the findings, and additional imaging will be scheduled. BI-RADS CATEGORY 0: Incomplete. Need additional imaging evaluation and/or prior mammograms for comparison. Electronically Signed By: Bethany [name] M.D. On: 10/26/2017 11:50     1. Scar - Noted on 10/26/2017  Location: 10 o'clock Side: right  Documented by Bethany [name] on 11/01/2017  Original Order  Ordered On Ordered By 10/27/2017 9:18 AM <[email or web address]> Bethany [name]    External Result Report  External Result Report <[email or web address]>.  The patient is a 57 year old female.   Allergies Bethany [name]; 11/08/2017 3:15 PM) No Known Drug Allergies [11/08/2017]: Allergies Reconciled  Medication History Bethany [name]; 11/08/2017 3:17 PM) Phentermine HCl (37.5MG  Capsule, Oral) Active. Levothyroxine Sodium (137MCG Tablet, Oral) Active. Estradiol (0.1MG /24HR Patch TW, Transdermal) Active. Vitamin D (Ergocalciferol) (50000UNIT Capsule, Oral) Active. Medications Reconciled     Review of Systems (Bethany [name] A. Evren Shankland [name]; 11/08/2017 4:05 PM) All other systems negative  Vitals Bethany [name]; 11/08/2017 3:18 PM) 11/08/2017 3:17 PM Weight: 187.8 lb Height: 67in Height was reported by  patient. Body Surface Area: 1.97 m Body Mass Index: 29.41 kg/m  Temp.: 98.49F(Oral)  Pulse: 106 (Regular)  BP: 136/92 (Sitting, Left Arm, Standard)      Physical Exam (Bethany [name]; 11/08/2017 4:02 PM)  General Mental Status-Alert. General Appearance-Consistent with stated age. Hydration-Well hydrated. Voice-Normal.  Head and Neck Head-normocephalic, atraumatic with no lesions or palpable masses. Trachea-midline. Thyroid Gland Characteristics - normal size and consistency.  Chest and Lung Exam Chest and lung exam reveals -quiet, even and easy respiratory effort with no use of accessory muscles and on auscultation, normal breath sounds, no adventitious sounds and normal vocal resonance. Inspection Chest Wall - Normal. Back - normal.  Breast Breast - Left-Symmetric, Non Tender, No Biopsy scars, no Dimpling, No Inflammation, No Lumpectomy scars, No Mastectomy scars, No Peau d' Orange. Breast - Right-Symmetric, Non Tender, No Biopsy scars, no Dimpling, No Inflammation, No Lumpectomy scars, No Mastectomy scars, No Peau d' Orange. Breast Lump-No Palpable Breast Mass.  Cardiovascular Cardiovascular examination reveals -normal heart sounds, regular rate and rhythm with no murmurs and normal pedal pulses bilaterally.  Neurologic Neurologic evaluation reveals -alert and oriented x 3 with no impairment of recent or remote memory. Mental Status-Normal.  Lymphatic Head & Neck  General Head & Neck Lymphatics: Bilateral - Description - Normal. Axillary  General Axillary Region: Bilateral - Description - Normal. Tenderness - Non Tender.    Assessment & Plan (Jaydence Arnesen A. Ilene Witcher [name]; 11/08/2017 4:03 PM)  LEFT BREAST MASS (N63.20) Impression: unable to tolerate core biopsy due to syncopal episode discussed the need to localize the lesion recommend left breast wire localized lumpectomy Risk of lumpectomy include bleeding, infection, seroma,  more surgery, use of seed/wire, wound care, cosmetic deformity and the need for other treatments, death , blood clots, death. Pt agrees to proceed.  Current Plans You are being scheduled for surgery- Our schedulers will call you.  You should hear from our office's scheduling department within 5 working days about the location, date, and time of surgery. We try to make accommodations for patient's preferences in scheduling surgery, but sometimes the OR schedule or the surgeon's schedule prevents [place] from making those accommodations.  If you have not heard from our office [phone]) in 5 working days, call the office and ask for your surgeon's nurse.  If you have other questions about your diagnosis, plan, or surgery, call the office and ask for your surgeon's nurse.  Pt Education - [facility] Breast Biopsy HCI: discussed with patient and provided information.

## 2017-11-09 ENCOUNTER — Other Ambulatory Visit: Payer: Self-pay | Admitting: Surgery

## 2017-11-09 ENCOUNTER — Telehealth: Payer: Self-pay | Admitting: *Deleted

## 2017-11-09 DIAGNOSIS — N6320 Unspecified lump in the left breast, unspecified quadrant: Secondary | ICD-10-CM

## 2017-11-09 NOTE — Telephone Encounter (Signed)
Agree to send Xanax 0.5 mg 1 tab PO the night before procedure and a couple of hours before the procedure.  #2, no refill.

## 2017-11-09 NOTE — Telephone Encounter (Signed)
Patient had visit on 11/08/17

## 2017-11-09 NOTE — Telephone Encounter (Signed)
Patient called to follow up from previous call on 11/01/17 had appointment with general surgeon yesterday and still will need to have left breast bx done at breast center. The surgeon suggested patient ask if could prescribed Rx to help keep her calm before and during procedure, patient said she is very nervous about procedure. She is not schedule yet, breast center is going to call her.  Please advise

## 2017-11-10 MED ORDER — ALPRAZOLAM 0.5 MG PO TABS: ORAL_TABLET | ORAL | 0 refills | Status: DC

## 2017-11-10 NOTE — Telephone Encounter (Signed)
Left on voicemail Rx called in.

## 2017-11-14 DIAGNOSIS — N6320 Unspecified lump in the left breast, unspecified quadrant: Secondary | ICD-10-CM

## 2017-11-14 DIAGNOSIS — 419620001 Death: Secondary | SNOMED CT | POA: Diagnosis not present

## 2017-11-14 HISTORY — PX: BREAST LUMPECTOMY: SHX2

## 2017-11-14 HISTORY — DX: Unspecified lump in the left breast, unspecified quadrant: N63.20

## 2017-11-14 DEATH — deceased

## 2017-11-18 ENCOUNTER — Other Ambulatory Visit: Payer: Self-pay

## 2017-11-18 ENCOUNTER — Encounter (HOSPITAL_BASED_OUTPATIENT_CLINIC_OR_DEPARTMENT_OTHER): Payer: Self-pay | Admitting: *Deleted

## 2017-11-18 NOTE — Pre-Procedure Instructions (Signed)
To come for EKG; to pick up Ensure pre-surgery drink 10 oz. - to drink by 0730 DOS.

## 2017-11-24 ENCOUNTER — Other Ambulatory Visit: Payer: Self-pay

## 2017-11-24 ENCOUNTER — Encounter (HOSPITAL_BASED_OUTPATIENT_CLINIC_OR_DEPARTMENT_OTHER)
Admission: RE | Admit: 2017-11-24 | Discharge: 2017-11-24 | Disposition: A | Discharge status: Home / Self Care | Payer: 59 | Source: Ambulatory Visit | Attending: Surgery | Admitting: Surgery

## 2017-11-24 DIAGNOSIS — Z79890 Hormone replacement therapy (postmenopausal): Secondary | ICD-10-CM | POA: Diagnosis not present

## 2017-11-24 DIAGNOSIS — Z87891 Personal history of nicotine dependence: Secondary | ICD-10-CM | POA: Diagnosis not present

## 2017-11-24 DIAGNOSIS — C50412 Malignant neoplasm of upper-outer quadrant of left female breast: Secondary | ICD-10-CM | POA: Diagnosis not present

## 2017-11-24 DIAGNOSIS — E039 Hypothyroidism, unspecified: Secondary | ICD-10-CM | POA: Diagnosis not present

## 2017-11-24 DIAGNOSIS — Z170 Estrogen receptor positive status [ER+]: Secondary | ICD-10-CM | POA: Diagnosis not present

## 2017-11-24 DIAGNOSIS — I10 Essential (primary) hypertension: Secondary | ICD-10-CM | POA: Diagnosis not present

## 2017-11-24 DIAGNOSIS — Z79899 Other long term (current) drug therapy: Secondary | ICD-10-CM | POA: Diagnosis not present

## 2017-11-24 NOTE — Progress Notes (Signed)
Patient seen by Caryl Asp, RN.  EKG completed and reviewed by Caryl Asp, RN.  Patient given pre surgery drink with directions for DOS.  Patient verbalized understanding per RN.

## 2017-11-25 ENCOUNTER — Encounter (HOSPITAL_BASED_OUTPATIENT_CLINIC_OR_DEPARTMENT_OTHER): Payer: Self-pay | Admitting: *Deleted

## 2017-11-25 ENCOUNTER — Other Ambulatory Visit: Payer: Self-pay

## 2017-11-25 ENCOUNTER — Ambulatory Visit
Admission: RE | Admit: 2017-11-25 | Discharge: 2017-11-25 | Disposition: A | Discharge status: Home / Self Care | Payer: 59 | Source: Ambulatory Visit | Attending: Surgery | Admitting: Surgery

## 2017-11-25 ENCOUNTER — Ambulatory Visit (HOSPITAL_BASED_OUTPATIENT_CLINIC_OR_DEPARTMENT_OTHER): Payer: 59 | Admitting: Anesthesiology

## 2017-11-25 ENCOUNTER — Ambulatory Visit (HOSPITAL_BASED_OUTPATIENT_CLINIC_OR_DEPARTMENT_OTHER)
Admission: RE | Admit: 2017-11-25 | Discharge: 2017-11-25 | Disposition: A | Discharge status: Home / Self Care | Payer: 59 | Source: Ambulatory Visit | Attending: Surgery | Admitting: Surgery

## 2017-11-25 ENCOUNTER — Encounter (HOSPITAL_BASED_OUTPATIENT_CLINIC_OR_DEPARTMENT_OTHER)
Admission: RE | Disposition: A | Discharge status: Home / Self Care | Payer: Self-pay | Source: Ambulatory Visit | Attending: Surgery

## 2017-11-25 DIAGNOSIS — Z87891 Personal history of nicotine dependence: Secondary | ICD-10-CM | POA: Insufficient documentation

## 2017-11-25 DIAGNOSIS — I10 Essential (primary) hypertension: Secondary | ICD-10-CM | POA: Insufficient documentation

## 2017-11-25 DIAGNOSIS — Z79899 Other long term (current) drug therapy: Secondary | ICD-10-CM | POA: Insufficient documentation

## 2017-11-25 DIAGNOSIS — N6320 Unspecified lump in the left breast, unspecified quadrant: Secondary | ICD-10-CM | POA: Diagnosis not present

## 2017-11-25 DIAGNOSIS — Z170 Estrogen receptor positive status [ER+]: Secondary | ICD-10-CM | POA: Insufficient documentation

## 2017-11-25 DIAGNOSIS — C50412 Malignant neoplasm of upper-outer quadrant of left female breast: Secondary | ICD-10-CM | POA: Diagnosis not present

## 2017-11-25 DIAGNOSIS — E039 Hypothyroidism, unspecified: Secondary | ICD-10-CM | POA: Insufficient documentation

## 2017-11-25 DIAGNOSIS — C50912 Malignant neoplasm of unspecified site of left female breast: Secondary | ICD-10-CM | POA: Diagnosis not present

## 2017-11-25 DIAGNOSIS — Z79890 Hormone replacement therapy (postmenopausal): Secondary | ICD-10-CM | POA: Insufficient documentation

## 2017-11-25 HISTORY — DX: Unspecified lump in the left breast, unspecified quadrant: N63.20

## 2017-11-25 HISTORY — PX: BREAST LUMPECTOMY WITH NEEDLE LOCALIZATION: SHX5759

## 2017-11-25 HISTORY — DX: Other specified postprocedural states: R11.2

## 2017-11-25 HISTORY — DX: Dental restoration status: Z98.811

## 2017-11-25 HISTORY — DX: Other specified postprocedural states: Z98.890

## 2017-11-25 IMAGING — MG NEEDLE LOCALIZATION OF THE LEFT BREAST WITH MAMMO GUIDANCE
1 series · 1 of 1 positions shown · non-contrast
Comparison: Previous exams.

CLINICAL DATA: 56-year-old female presents for wire localization of
suspicious mass/distortion in the upper-outer left breast.

EXAM:
NEEDLE LOCALIZATION OF THE LEFT BREAST WITH MAMMO GUIDANCE

[L CC]
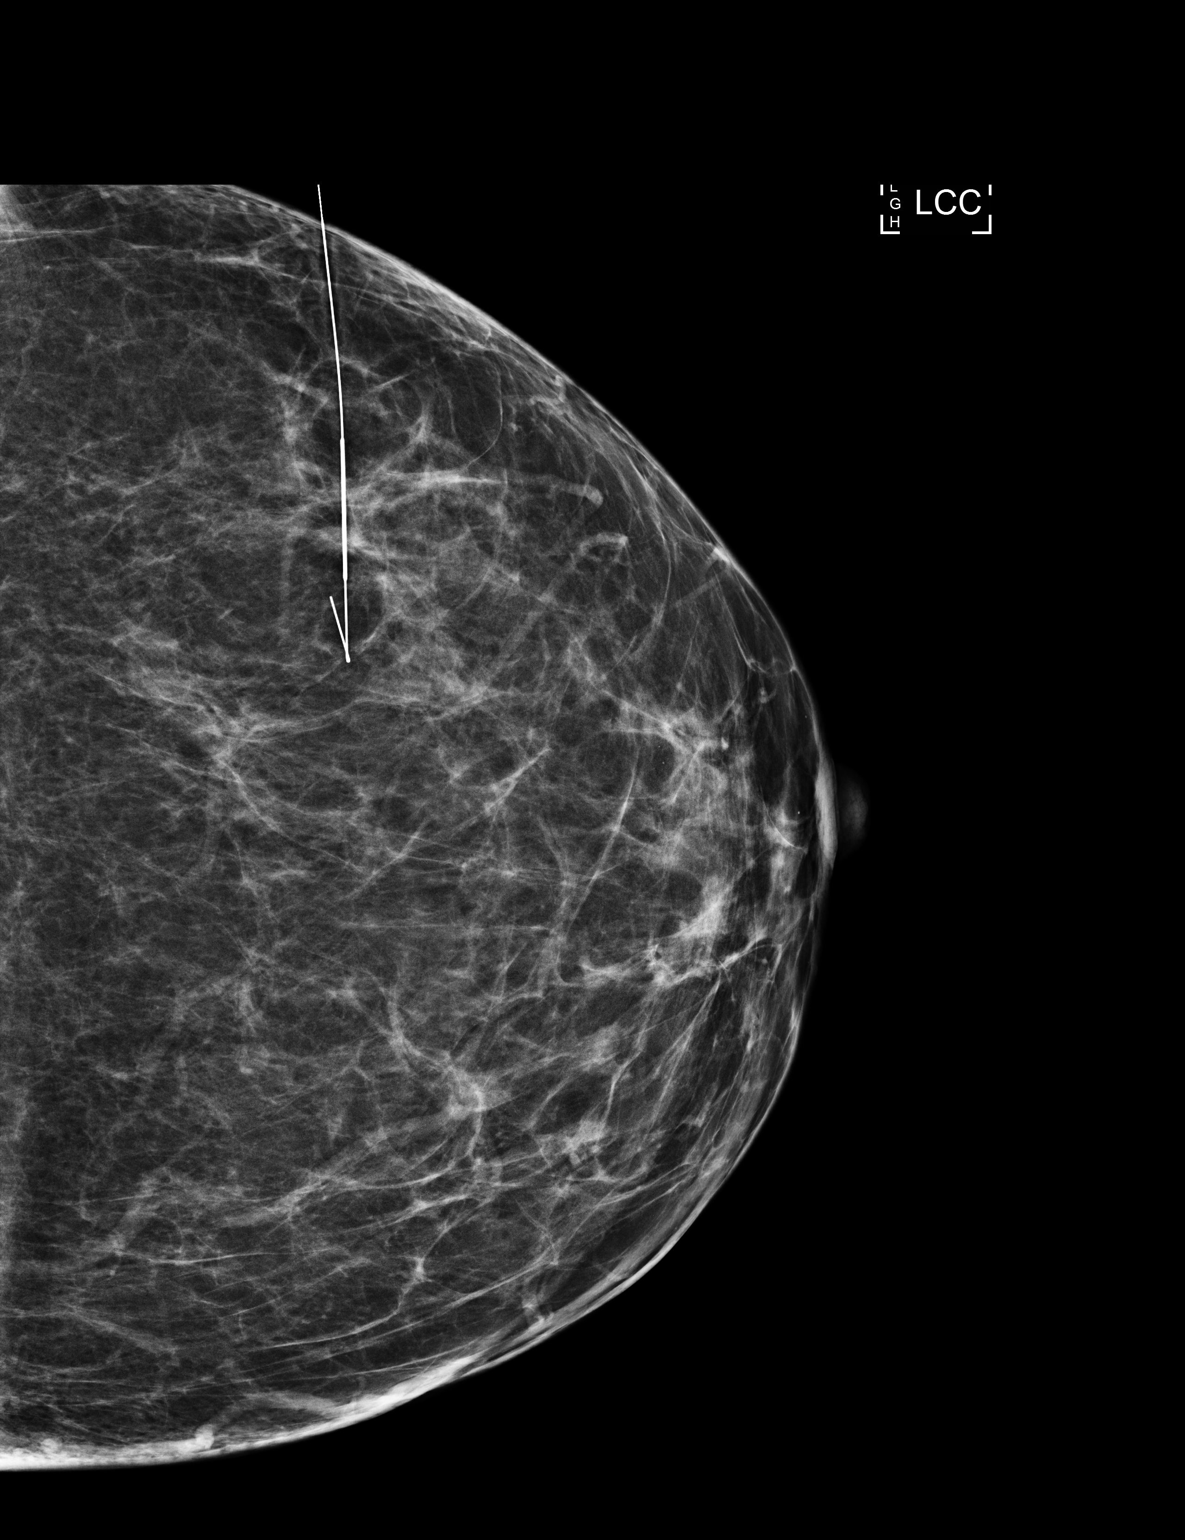

[1 of 1 positions shown; findings below may reference images not displayed]

FINDINGS: Patient presents for needle localization prior to left breast
excision. I met with the patient and we discussed the procedure of
needle localization including benefits and alternatives. We
discussed the high likelihood of a successful procedure. We
discussed the risks of the procedure, including infection, bleeding,
tissue injury, and further surgery. Informed, written consent was
given. The usual time-out protocol was performed immediately prior
to the procedure.

Using mammographic guidance, sterile technique, 1% lidocaine and a 5
cm modified Kopans needle, the mass/distortion was localized using
lateral to medial approach. The images were marked for Dr. FERFECKI.
IMPRESSION: Needle localization left breast. No apparent complications.

## 2017-11-25 IMAGING — MG BREAST SURGICAL SPECIMEN
1 series · 1 of 1 positions shown · non-contrast
Comparison: Previous exam(s).

CLINICAL DATA: Post left breast excision for distortion.

EXAM:
SPECIMEN RADIOGRAPH OF THE LEFT BREAST

[L]
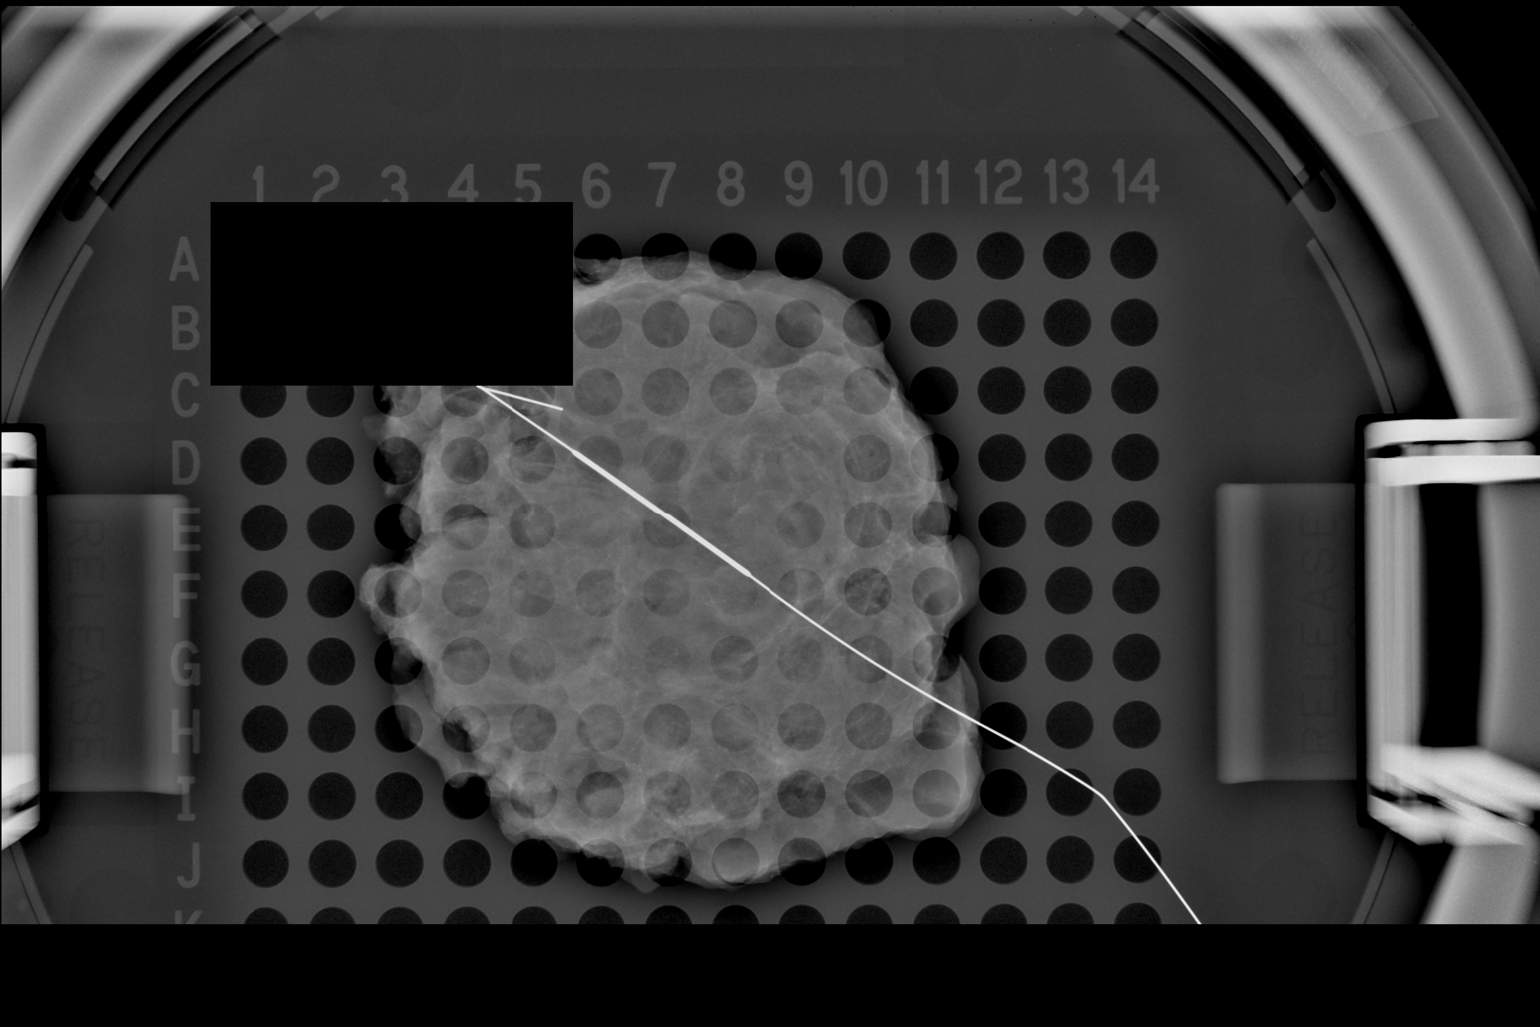

[1 of 1 positions shown; findings below may reference images not displayed]

FINDINGS: Status post excision of the left breast. The wire tip is present and
marked for pathology.
IMPRESSION: Specimen radiograph of the left breast.

## 2017-11-25 SURGERY — BREAST LUMPECTOMY WITH NEEDLE LOCALIZATION
Anesthesia: General | Site: Breast | Laterality: Left

## 2017-11-25 MED ORDER — HYDROCODONE-ACETAMINOPHEN 5-325 MG PO TABS: 1.0000 | ORAL_TABLET | Freq: Four times a day (QID) | ORAL | 0 refills | Status: DC | PRN

## 2017-11-25 MED ORDER — ACETAMINOPHEN 500 MG PO TABS
ORAL_TABLET | ORAL | Status: AC
  Filled 2017-11-25: qty 2

## 2017-11-25 MED ORDER — FENTANYL CITRATE (PF) 100 MCG/2ML IJ SOLN
INTRAMUSCULAR | Status: AC
  Filled 2017-11-25: qty 2

## 2017-11-25 MED ORDER — LACTATED RINGERS IV SOLN
INTRAVENOUS | Status: DC
  Administered 2017-11-25: 10:00:00 via INTRAVENOUS

## 2017-11-25 MED ORDER — IBUPROFEN 800 MG PO TABS: 800.0000 mg | ORAL_TABLET | Freq: Three times a day (TID) | ORAL | 0 refills | Status: DC | PRN

## 2017-11-25 MED ORDER — CEFAZOLIN SODIUM-DEXTROSE 2-4 GM/100ML-% IV SOLN
INTRAVENOUS | Status: AC
  Filled 2017-11-25: qty 100

## 2017-11-25 MED ORDER — FENTANYL CITRATE (PF) 100 MCG/2ML IJ SOLN
50.0000 ug | INTRAMUSCULAR | Status: DC | PRN
  Administered 2017-11-25: 100 ug via INTRAVENOUS

## 2017-11-25 MED ORDER — LIDOCAINE 2% (20 MG/ML) 5 ML SYRINGE
INTRAMUSCULAR | Status: DC | PRN
  Administered 2017-11-25: 60 mg via INTRAVENOUS

## 2017-11-25 MED ORDER — ONDANSETRON HCL 4 MG/2ML IJ SOLN
INTRAMUSCULAR | Status: AC
  Filled 2017-11-25: qty 2

## 2017-11-25 MED ORDER — BUPIVACAINE-EPINEPHRINE (PF) 0.25% -1:200000 IJ SOLN
INTRAMUSCULAR | Status: AC
  Filled 2017-11-25: qty 30

## 2017-11-25 MED ORDER — EPHEDRINE SULFATE 50 MG/ML IJ SOLN
INTRAMUSCULAR | Status: DC | PRN
  Administered 2017-11-25: 10 mg via INTRAVENOUS

## 2017-11-25 MED ORDER — CEFAZOLIN SODIUM-DEXTROSE 2-4 GM/100ML-% IV SOLN
2.0000 g | INTRAVENOUS | Status: AC
  Administered 2017-11-25: 2 g via INTRAVENOUS

## 2017-11-25 MED ORDER — BUPIVACAINE-EPINEPHRINE 0.25% -1:200000 IJ SOLN
INTRAMUSCULAR | Status: DC | PRN
  Administered 2017-11-25: 20 mL

## 2017-11-25 MED ORDER — MIDAZOLAM HCL 2 MG/2ML IJ SOLN
INTRAMUSCULAR | Status: AC
  Filled 2017-11-25: qty 2

## 2017-11-25 MED ORDER — ONDANSETRON HCL 4 MG/2ML IJ SOLN
INTRAMUSCULAR | Status: DC | PRN
  Administered 2017-11-25: 4 mg via INTRAVENOUS

## 2017-11-25 MED ORDER — FENTANYL CITRATE (PF) 100 MCG/2ML IJ SOLN: 25.0000 ug | INTRAMUSCULAR | Status: DC | PRN

## 2017-11-25 MED ORDER — DEXAMETHASONE SODIUM PHOSPHATE 10 MG/ML IJ SOLN
INTRAMUSCULAR | Status: AC
  Filled 2017-11-25: qty 1

## 2017-11-25 MED ORDER — PROMETHAZINE HCL 25 MG/ML IJ SOLN
INTRAMUSCULAR | Status: AC
  Filled 2017-11-25: qty 1

## 2017-11-25 MED ORDER — CHLORHEXIDINE GLUCONATE CLOTH 2 % EX PADS: 6.0000 | MEDICATED_PAD | Freq: Once | CUTANEOUS | Status: DC

## 2017-11-25 MED ORDER — CELECOXIB 200 MG PO CAPS
200.0000 mg | ORAL_CAPSULE | ORAL | Status: AC
  Administered 2017-11-25: 200 mg via ORAL

## 2017-11-25 MED ORDER — DEXAMETHASONE SODIUM PHOSPHATE 4 MG/ML IJ SOLN
INTRAMUSCULAR | Status: DC | PRN
  Administered 2017-11-25: 10 mg via INTRAVENOUS

## 2017-11-25 MED ORDER — ACETAMINOPHEN 500 MG PO TABS
1000.0000 mg | ORAL_TABLET | ORAL | Status: AC
  Administered 2017-11-25: 1000 mg via ORAL

## 2017-11-25 MED ORDER — GABAPENTIN 300 MG PO CAPS
ORAL_CAPSULE | ORAL | Status: AC
  Filled 2017-11-25: qty 1

## 2017-11-25 MED ORDER — SCOPOLAMINE 1 MG/3DAYS TD PT72: 1.0000 | MEDICATED_PATCH | Freq: Once | TRANSDERMAL | Status: DC | PRN

## 2017-11-25 MED ORDER — EPHEDRINE 5 MG/ML INJ
INTRAVENOUS | Status: AC
  Filled 2017-11-25: qty 10

## 2017-11-25 MED ORDER — PROMETHAZINE HCL 25 MG/ML IJ SOLN
6.2500 mg | Freq: Once | INTRAMUSCULAR | Status: AC
  Administered 2017-11-25: 6.25 mg via INTRAVENOUS

## 2017-11-25 MED ORDER — PROPOFOL 10 MG/ML IV BOLUS
INTRAVENOUS | Status: DC | PRN
  Administered 2017-11-25: 150 mg via INTRAVENOUS

## 2017-11-25 MED ORDER — CELECOXIB 200 MG PO CAPS
ORAL_CAPSULE | ORAL | Status: AC
  Filled 2017-11-25: qty 1

## 2017-11-25 MED ORDER — PROPOFOL 500 MG/50ML IV EMUL
INTRAVENOUS | Status: AC
  Filled 2017-11-25: qty 50

## 2017-11-25 MED ORDER — GABAPENTIN 300 MG PO CAPS
300.0000 mg | ORAL_CAPSULE | ORAL | Status: AC
  Administered 2017-11-25: 300 mg via ORAL

## 2017-11-25 MED ORDER — LIDOCAINE 2% (20 MG/ML) 5 ML SYRINGE
INTRAMUSCULAR | Status: AC
  Filled 2017-11-25: qty 5

## 2017-11-25 MED ORDER — MIDAZOLAM HCL 2 MG/2ML IJ SOLN
1.0000 mg | INTRAMUSCULAR | Status: DC | PRN
  Administered 2017-11-25: 2 mg via INTRAVENOUS

## 2017-11-25 MED ORDER — ONDANSETRON HCL 4 MG/2ML IJ SOLN: 4.0000 mg | Freq: Once | INTRAMUSCULAR | Status: DC | PRN

## 2017-11-25 SURGICAL SUPPLY — 59 items
ADH SKN CLS APL DERMABOND .7 (GAUZE/BANDAGES/DRESSINGS) ×1
APPLIER CLIP 11 MED OPEN (CLIP)
APPLIER CLIP 9.375 MED OPEN (MISCELLANEOUS)
APR CLP MED 11 20 MLT OPN (CLIP)
APR CLP MED 9.3 20 MLT OPN (MISCELLANEOUS)
BINDER BREAST LRG (GAUZE/BANDAGES/DRESSINGS) IMPLANT
BINDER BREAST MEDIUM (GAUZE/BANDAGES/DRESSINGS) IMPLANT
BINDER BREAST XLRG (GAUZE/BANDAGES/DRESSINGS) ×2 IMPLANT
BINDER BREAST XXLRG (GAUZE/BANDAGES/DRESSINGS) IMPLANT
BIOPATCH RED 1 DISK 7.0 (GAUZE/BANDAGES/DRESSINGS) IMPLANT
BIOPATCH RED 1IN DISK 7.0MM (GAUZE/BANDAGES/DRESSINGS)
BLADE SURG 15 STRL LF DISP TIS (BLADE) ×1 IMPLANT
BLADE SURG 15 STRL SS (BLADE) ×3
CANISTER SUCT 1200ML W/VALVE (MISCELLANEOUS) ×3 IMPLANT
CHLORAPREP W/TINT 26ML (MISCELLANEOUS) ×3 IMPLANT
CLIP APPLIE 11 MED OPEN (CLIP) IMPLANT
CLIP APPLIE 9.375 MED OPEN (MISCELLANEOUS) IMPLANT
COVER BACK TABLE 60X90IN (DRAPES) ×3 IMPLANT
COVER MAYO STAND STRL (DRAPES) ×3 IMPLANT
DECANTER SPIKE VIAL GLASS SM (MISCELLANEOUS) ×1 IMPLANT
DERMABOND ADVANCED (GAUZE/BANDAGES/DRESSINGS) ×2
DERMABOND ADVANCED .7 DNX12 (GAUZE/BANDAGES/DRESSINGS) ×1 IMPLANT
DEVICE DUBIN W/COMP PLATE 8390 (MISCELLANEOUS) ×2 IMPLANT
DRAIN CHANNEL 19F RND (DRAIN) IMPLANT
DRAPE LAPAROSCOPIC ABDOMINAL (DRAPES) IMPLANT
DRAPE LAPAROTOMY 100X72 PEDS (DRAPES) ×3 IMPLANT
DRAPE UTILITY XL STRL (DRAPES) ×3 IMPLANT
ELECT COATED BLADE 2.86 ST (ELECTRODE) ×3 IMPLANT
ELECT REM PT RETURN 9FT ADLT (ELECTROSURGICAL) ×3
ELECTRODE REM PT RTRN 9FT ADLT (ELECTROSURGICAL) ×1 IMPLANT
EVACUATOR SILICONE 100CC (DRAIN) IMPLANT
GLOVE BIOGEL PI IND STRL 6.5 (GLOVE) IMPLANT
GLOVE BIOGEL PI IND STRL 8 (GLOVE) ×1 IMPLANT
GLOVE BIOGEL PI INDICATOR 6.5 (GLOVE) ×2
GLOVE BIOGEL PI INDICATOR 8 (GLOVE) ×2
GLOVE ECLIPSE 8.0 STRL XLNG CF (GLOVE) ×1 IMPLANT
GLOVE EXAM NITRILE MD LF STRL (GLOVE) ×2 IMPLANT
GLOVE SURG SS PI 6.5 STRL IVOR (GLOVE) ×2 IMPLANT
GLOVE SURG SS PI 8.0 STRL IVOR (GLOVE) ×2 IMPLANT
GOWN STRL REUS W/ TWL LRG LVL3 (GOWN DISPOSABLE) ×2 IMPLANT
GOWN STRL REUS W/TWL LRG LVL3 (GOWN DISPOSABLE) ×6
HEMOSTAT ARISTA ABSORB 3G PWDR (MISCELLANEOUS) IMPLANT
KIT MARKER MARGIN INK (KITS) ×2 IMPLANT
NDL HYPO 25X1 1.5 SAFETY (NEEDLE) ×1 IMPLANT
NEEDLE HYPO 25X1 1.5 SAFETY (NEEDLE) ×3 IMPLANT
NS IRRIG 1000ML POUR BTL (IV SOLUTION) ×3 IMPLANT
PACK BASIN DAY SURGERY FS (CUSTOM PROCEDURE TRAY) ×3 IMPLANT
PENCIL BUTTON HOLSTER BLD 10FT (ELECTRODE) ×3 IMPLANT
SLEEVE SCD COMPRESS KNEE MED (MISCELLANEOUS) ×3 IMPLANT
SPONGE LAP 4X18 RFD (DISPOSABLE) ×2 IMPLANT
SUT MON AB 4-0 PC3 18 (SUTURE) ×3 IMPLANT
SUT SILK 2 0 SH (SUTURE) IMPLANT
SUT VICRYL 3-0 CR8 SH (SUTURE) ×3 IMPLANT
SYR CONTROL 10ML LL (SYRINGE) ×3 IMPLANT
TOWEL GREEN STERILE FF (TOWEL DISPOSABLE) ×4 IMPLANT
TOWEL OR NON WOVEN STRL DISP B (DISPOSABLE) ×1 IMPLANT
TUBE CONNECTING 20'X1/4 (TUBING) ×1
TUBE CONNECTING 20X1/4 (TUBING) ×2 IMPLANT
YANKAUER SUCT BULB TIP NO VENT (SUCTIONS) ×3 IMPLANT

## 2017-11-25 NOTE — Transfer of Care (Signed)
Immediate Anesthesia Transfer of Care Note  Patient: Bethany [name]  Procedure(s) Performed: LEFT BREAST LUMPECTOMY WITH NEEDLE LOCALIZATION (Left Breast)  Patient Location: PACU  Anesthesia Type:General  Level of Consciousness: awake and sedated  Airway & Oxygen Therapy: Patient Spontanous Breathing and Patient connected to face mask oxygen  Post-op Assessment: Report given to RN and Post -op Vital signs reviewed and stable  Post vital signs: Reviewed and stable  Last Vitals:  Vitals Value Taken Time  BP 130/83 11/25/2017 11:44 AM  Temp    Pulse 72 11/25/2017 11:45 AM  Resp 13 11/25/2017 11:45 AM  SpO2 100 % 11/25/2017 11:45 AM  Vitals shown include unvalidated device data.  Last Pain:  Vitals:   11/25/17 0944  TempSrc: Oral  PainSc: 0-No pain         Complications: No apparent anesthesia complications

## 2017-11-25 NOTE — Interval H&P Note (Signed)
History and Physical Interval Note:  11/25/2017 10:37 AM  Bethany [name]  has presented today for surgery, with the diagnosis of LEFT BREAST MASS  The various methods of treatment have been discussed with the patient and family. After consideration of risks, benefits and other options for treatment, the patient has consented to  Procedure(s): LEFT BREAST LUMPECTOMY WITH NEEDLE LOCALIZATION (Left) as a surgical intervention .  The patient's history has been reviewed, patient examined, no change in status, stable for surgery.  I have reviewed the patient's chart and labs.  Questions were answered to the patient's satisfaction.     [place]

## 2017-11-25 NOTE — Anesthesia Postprocedure Evaluation (Signed)
Anesthesia Post Note  Patient: Bethany [name]  Procedure(s) Performed: LEFT BREAST LUMPECTOMY WITH NEEDLE LOCALIZATION (Left Breast)     Patient location during evaluation: PACU Anesthesia Type: General Level of consciousness: awake and alert and oriented Pain management: pain level controlled Vital Signs Assessment: post-procedure vital signs reviewed and stable Respiratory status: spontaneous breathing and nonlabored ventilation Cardiovascular status: blood pressure returned to baseline and stable Anesthetic complications: yes Anesthetic complication details: PONV   Last Vitals:  Vitals:   11/25/17 1145 11/25/17 1200  BP: 129/90 140/82  Pulse: 72 65  Resp: 13 13  Temp:    SpO2: 100% 100%    Last Pain:  Vitals:   11/25/17 1200  TempSrc:   PainSc: 0-No pain                 [name] A.

## 2017-11-25 NOTE — Discharge Instructions (Signed)
[facility] °Office Phone Number [phone] ° °BREAST BIOPSY/ PARTIAL MASTECTOMY: POST OP INSTRUCTIONS ° °Always review your discharge instruction sheet given to you by the facility where your surgery was performed. ° °IF YOU HAVE DISABILITY OR FAMILY LEAVE FORMS, YOU MUST BRING THEM TO THE OFFICE FOR PROCESSING.  DO NOT GIVE THEM TO YOUR DOCTOR. ° °1. A prescription for pain medication may be given to you upon discharge.  Take your pain medication as prescribed, if needed.  If narcotic pain medicine is not needed, then you may take acetaminophen (Tylenol) or ibuprofen (Advil) as needed. °2. Take your usually prescribed medications unless otherwise directed °3. If you need a refill on your pain medication, please contact your pharmacy.  They will contact our office to request authorization.  Prescriptions will not be filled after 5pm or on week-ends. °4. You should eat very light the first 24 hours after surgery, such as soup, crackers, pudding, etc.  Resume your normal diet the day after surgery. °5. Most patients will experience some swelling and bruising in the breast.  Ice packs and a good support bra will help.  Swelling and bruising can take several days to resolve.  °6. It is common to experience some constipation if taking pain medication after surgery.  Increasing fluid intake and taking a stool softener will usually help or prevent this problem from occurring.  A mild laxative (Milk of Magnesia or Miralax) should be taken according to package directions if there are no bowel movements after 48 hours. °7. Unless discharge instructions indicate otherwise, you may remove your bandages 24-48 hours after surgery, and you may shower at that time.  You may have steri-strips (small skin tapes) in place directly over the incision.  These strips should be left on the skin for 7-10 days.  If your surgeon used skin glue on the incision, you may shower in 24 hours.  The glue will flake off over the  next 2-3 weeks.  Any sutures or staples will be removed at the office during your follow-up visit. °8. ACTIVITIES:  You may resume regular daily activities (gradually increasing) beginning the next day.  Wearing a good support bra or sports bra minimizes pain and swelling.  You may have sexual intercourse when it is comfortable. °a. You may drive when you no longer are taking prescription pain medication, you can comfortably wear a seatbelt, and you can safely maneuver your car and apply brakes. °b. RETURN TO WORK:  ______________________________________________________________________________________ °9. You should see your doctor in the office for a follow-up appointment approximately two weeks after your surgery.  Your doctor’s nurse will typically make your follow-up appointment when she calls you with your pathology report.  Expect your pathology report 2-3 business days after your surgery.  You may call to check if you do not hear from us after three days. °10. OTHER INSTRUCTIONS: _______________________________________________________________________________________________ _____________________________________________________________________________________________________________________________________ °_____________________________________________________________________________________________________________________________________ °_____________________________________________________________________________________________________________________________________ ° °WHEN TO CALL YOUR DOCTOR: °1. Fever over 101.0 °2. Nausea and/or vomiting. °3. Extreme swelling or bruising. °4. Continued bleeding from incision. °5. Increased pain, redness, or drainage from the incision. ° °The clinic staff is available to answer your questions during regular business hours.  Please don’t hesitate to call and ask to speak to one of the nurses for clinical concerns.  If you have a medical emergency, go to the nearest  emergency room or call 911.  A surgeon from [facility] is always on call at the hospital. ° °For further questions, please visit [email or web address]  ° ° ° ° °  Post Anesthesia Home Care Instructions ° °Activity: °Get plenty of rest for the remainder of the day. A responsible individual must stay with you for 24 hours following the procedure.  °For the next 24 hours, DO NOT: °-Drive a car °-Operate machinery °-Drink alcoholic beverages °-Take any medication unless instructed by your physician °-Make any legal decisions or sign important papers. ° °Meals: °Start with liquid foods such as gelatin or soup. Progress to regular foods as tolerated. Avoid greasy, spicy, heavy foods. If nausea and/or vomiting occur, drink only clear liquids until the nausea and/or vomiting subsides. Call your physician if vomiting continues. ° °Special Instructions/Symptoms: °Your throat may feel dry or sore from the anesthesia or the breathing tube placed in your throat during surgery. If this causes discomfort, gargle with warm salt water. The discomfort should disappear within 24 hours. ° °If you had a scopolamine patch placed behind your ear for the management of post- operative nausea and/or vomiting: ° °1. The medication in the patch is effective for 72 hours, after which it should be removed.  Wrap patch in a tissue and discard in the trash. Wash hands thoroughly with soap and water. °2. You may remove the patch earlier than 72 hours if you experience unpleasant side effects which may include dry mouth, dizziness or visual disturbances. °3. Avoid touching the patch. Wash your hands with soap and water after contact with the patch. °  ° °

## 2017-11-25 NOTE — Op Note (Signed)
Preoperative diagnosis: Left breast mass  Postop diagnosis: Same  Procedure: Left breast wire localized lumpectomy  Surgeon: Erroll Luna, MD  Anesthesia: LMA with local  EBL: 10 cc  Drains: None  Specimen: Left breast tissue with localizing wire and mass.  Additional superior margin taken as well.  Indications for procedure the patient is a 57 year old female with a mammographic abnormality involving her left breast.  She was unable to tolerate core biopsy and presents for excisional biopsy with wire localization.The procedure has been discussed with the patient. Alternatives to surgery have been discussed with the patient.  Risks of surgery include bleeding,  Infection,  Seroma formation, death,  and the need for further surgery.   The patient understands and wishes to proceed.   Description of procedure: The patient underwent wire localization prior to procedure with the breast [place].  She was met in the holding area and left breast was marked as the correct side.  Questions were answered.  She was taken the operating room placed supine upon the OR table.  After induction of general anesthesia, left breast was prepped and draped in sterile fashion and the wire was trimmed.  Timeout was done.  She received preoperative antibiotics.  Curvilinear incision was made on the lateral breast with a wire entered the skin.  Dissection was carried down to excise all tissue around the tip of the wire.  I took an additional superior margin.  Faxitron revealed the tissue and wire to be in the specimen and all specimens were sent to pathology.  The wound was made hemostatic.  Cautery was used for this.  Local anesthetic of 0.25% Sensorcaine was infiltrated in the cavity.  The cavity closed with 3-0 Vicryl and 4-0 Monocryl.  Dermabond applied.  Breast binder placed.  All final counts found to be correct.  The patient was awoke extubated taken recovery in satisfactory condition.

## 2017-11-25 NOTE — Anesthesia Procedure Notes (Signed)
Procedure Name: LMA Insertion Performed by: Azyriah Nevins W, CRNA Pre-anesthesia Checklist: Patient identified, Emergency Drugs available, Suction available and Patient being monitored Patient Re-evaluated:Patient Re-evaluated prior to induction Oxygen Delivery Method: Circle system utilized Preoxygenation: Pre-oxygenation with 100% oxygen Induction Type: IV induction Ventilation: Mask ventilation without difficulty LMA: LMA inserted LMA Size: 4.0 Number of attempts: 1 Placement Confirmation: positive ETCO2 Tube secured with: Tape Dental Injury: Teeth and Oropharynx as per pre-operative assessment        

## 2017-11-25 NOTE — Anesthesia Preprocedure Evaluation (Signed)
Anesthesia Evaluation  Patient identified by MRN, date of birth, ID band Patient awake    Reviewed: Allergy & Precautions, NPO status , Patient's Chart, lab work & pertinent test results  History of Anesthesia Complications (+) PONV  Airway Mallampati: II  TM Distance: >3 FB Neck ROM: Full    Dental  (+) Dental Advisory Given   Pulmonary former smoker,    breath sounds clear to auscultation       Cardiovascular hypertension, Pt. on medications  Rhythm:Regular Rate:Normal     Neuro/Psych Anxiety negative neurological ROS     GI/Hepatic Neg liver ROS, hiatal hernia,   Endo/Other  Hypothyroidism   Renal/GU negative Renal ROS     Musculoskeletal   Abdominal   Peds  Hematology   Anesthesia Other Findings   Reproductive/Obstetrics                             Anesthesia Physical Anesthesia Plan  ASA: II  Anesthesia Plan: General   Post-op Pain Management:    Induction: Intravenous  PONV Risk Score and Plan: 4 or greater and Scopolamine patch - Pre-op, Midazolam, Dexamethasone, Ondansetron and Treatment may vary due to age or medical condition  Airway Management Planned: LMA  Additional Equipment:   Intra-op Plan:   Post-operative Plan: Extubation in OR  Informed Consent: I have reviewed the patients History and Physical, chart, labs and discussed the procedure including the risks, benefits and alternatives for the proposed anesthesia with the patient or authorized representative who has indicated his/her understanding and acceptance.   Dental advisory given  Plan Discussed with: CRNA  Anesthesia Plan Comments:         Anesthesia Quick Evaluation

## 2017-11-26 ENCOUNTER — Encounter (HOSPITAL_BASED_OUTPATIENT_CLINIC_OR_DEPARTMENT_OTHER): Payer: Self-pay | Admitting: Surgery

## 2017-11-29 ENCOUNTER — Ambulatory Visit: Payer: 59 | Admitting: Obstetrics & Gynecology

## 2017-11-29 ENCOUNTER — Ambulatory Visit: Payer: Self-pay | Admitting: Surgery

## 2017-11-29 DIAGNOSIS — D0592 Unspecified type of carcinoma in situ of left breast: Secondary | ICD-10-CM

## 2017-11-30 ENCOUNTER — Telehealth: Payer: Self-pay | Admitting: Oncology

## 2017-11-30 NOTE — Telephone Encounter (Signed)
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 Linda Hedges for the high risk breast clinic. Pt has been scheduled to see Dr. Jana Hakim on 10/17 at 4pm. Pt is aware to arrive 30 minutes early. Letter mailed.

## 2017-12-02 ENCOUNTER — Telehealth: Payer: Self-pay | Admitting: Oncology

## 2017-12-02 ENCOUNTER — Other Ambulatory Visit: Payer: Self-pay

## 2017-12-02 ENCOUNTER — Encounter: Payer: Self-pay | Admitting: Radiation Oncology

## 2017-12-02 ENCOUNTER — Encounter (HOSPITAL_BASED_OUTPATIENT_CLINIC_OR_DEPARTMENT_OTHER): Payer: Self-pay | Admitting: *Deleted

## 2017-12-02 ENCOUNTER — Encounter: Payer: Self-pay | Admitting: Oncology

## 2017-12-02 ENCOUNTER — Telehealth: Payer: Self-pay | Admitting: Radiation Oncology

## 2017-12-02 NOTE — Telephone Encounter (Signed)
Unable to leave VM on patient's phone her mailbox is full. I scheduled the appointment with Dr. Lisbeth Renshaw on 12/23/2017 @ 12:30 in the hope that the patient will call because the patient is on my chart and will see a new appointment has been schedule. I need to confirm she is available for that time or reschedule the appointment.

## 2017-12-02 NOTE — Telephone Encounter (Signed)
Pt confirmed appts with Drs. Magrinat on 10/3 and Moody on  10/10.

## 2017-12-02 NOTE — Telephone Encounter (Signed)
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 Pt has been rescheduled to see Dr. Jana Hakim on 10/3 at 4pm due to new diagnosis. Pt's vm is full. Letter mailed.

## 2017-12-07 NOTE — Progress Notes (Signed)
Ensure pre surgery drink given with instructions to complete by 0600 dos, pt verbalized understanding. 

## 2017-12-09 ENCOUNTER — Encounter (HOSPITAL_COMMUNITY)
Admission: RE | Admit: 2017-12-09 | Discharge: 2017-12-09 | Disposition: A | Discharge status: Home / Self Care | Payer: 59 | Source: Ambulatory Visit | Attending: Surgery | Admitting: Surgery

## 2017-12-09 ENCOUNTER — Encounter (HOSPITAL_BASED_OUTPATIENT_CLINIC_OR_DEPARTMENT_OTHER): Payer: Self-pay | Admitting: Anesthesiology

## 2017-12-09 ENCOUNTER — Other Ambulatory Visit: Payer: Self-pay

## 2017-12-09 ENCOUNTER — Ambulatory Visit (HOSPITAL_BASED_OUTPATIENT_CLINIC_OR_DEPARTMENT_OTHER): Payer: 59 | Admitting: Anesthesiology

## 2017-12-09 ENCOUNTER — Ambulatory Visit (HOSPITAL_BASED_OUTPATIENT_CLINIC_OR_DEPARTMENT_OTHER)
Admission: RE | Admit: 2017-12-09 | Discharge: 2017-12-09 | Disposition: A | Discharge status: Home / Self Care | Payer: 59 | Source: Ambulatory Visit | Attending: Surgery | Admitting: Surgery

## 2017-12-09 ENCOUNTER — Encounter (HOSPITAL_BASED_OUTPATIENT_CLINIC_OR_DEPARTMENT_OTHER)
Admission: RE | Disposition: A | Discharge status: Home / Self Care | Payer: Self-pay | Source: Ambulatory Visit | Attending: Surgery

## 2017-12-09 DIAGNOSIS — E669 Obesity, unspecified: Secondary | ICD-10-CM | POA: Insufficient documentation

## 2017-12-09 DIAGNOSIS — G8918 Other acute postprocedural pain: Secondary | ICD-10-CM | POA: Diagnosis not present

## 2017-12-09 DIAGNOSIS — E559 Vitamin D deficiency, unspecified: Secondary | ICD-10-CM | POA: Diagnosis not present

## 2017-12-09 DIAGNOSIS — Z6832 Body mass index (BMI) 32.0-32.9, adult: Secondary | ICD-10-CM | POA: Insufficient documentation

## 2017-12-09 DIAGNOSIS — D0592 Unspecified type of carcinoma in situ of left breast: Secondary | ICD-10-CM

## 2017-12-09 DIAGNOSIS — Z87891 Personal history of nicotine dependence: Secondary | ICD-10-CM | POA: Diagnosis not present

## 2017-12-09 DIAGNOSIS — Z79890 Hormone replacement therapy (postmenopausal): Secondary | ICD-10-CM | POA: Diagnosis not present

## 2017-12-09 DIAGNOSIS — F419 Anxiety disorder, unspecified: Secondary | ICD-10-CM | POA: Diagnosis not present

## 2017-12-09 DIAGNOSIS — Z79899 Other long term (current) drug therapy: Secondary | ICD-10-CM | POA: Insufficient documentation

## 2017-12-09 DIAGNOSIS — C50912 Malignant neoplasm of unspecified site of left female breast: Secondary | ICD-10-CM | POA: Diagnosis not present

## 2017-12-09 DIAGNOSIS — E039 Hypothyroidism, unspecified: Secondary | ICD-10-CM | POA: Insufficient documentation

## 2017-12-09 DIAGNOSIS — I10 Essential (primary) hypertension: Secondary | ICD-10-CM | POA: Diagnosis not present

## 2017-12-09 DIAGNOSIS — C50412 Malignant neoplasm of upper-outer quadrant of left female breast: Secondary | ICD-10-CM | POA: Insufficient documentation

## 2017-12-09 HISTORY — PX: AXILLARY SENTINEL NODE BIOPSY: SHX5738

## 2017-12-09 SURGERY — BIOPSY, LYMPH NODE, SENTINEL, AXILLARY
Anesthesia: General | Site: Axilla | Laterality: Left

## 2017-12-09 MED ORDER — BUPIVACAINE-EPINEPHRINE 0.25% -1:200000 IJ SOLN
INTRAMUSCULAR | Status: DC | PRN
  Administered 2017-12-09: 10 mL

## 2017-12-09 MED ORDER — FENTANYL CITRATE (PF) 100 MCG/2ML IJ SOLN
25.0000 ug | INTRAMUSCULAR | Status: DC | PRN
  Administered 2017-12-09: 50 ug via INTRAVENOUS

## 2017-12-09 MED ORDER — ACETAMINOPHEN 500 MG PO TABS
1000.0000 mg | ORAL_TABLET | ORAL | Status: AC
  Administered 2017-12-09: 1000 mg via ORAL

## 2017-12-09 MED ORDER — SCOPOLAMINE 1 MG/3DAYS TD PT72: 1.0000 | MEDICATED_PATCH | Freq: Once | TRANSDERMAL | Status: DC | PRN

## 2017-12-09 MED ORDER — LIDOCAINE 2% (20 MG/ML) 5 ML SYRINGE
INTRAMUSCULAR | Status: AC
  Filled 2017-12-09: qty 5

## 2017-12-09 MED ORDER — FENTANYL CITRATE (PF) 100 MCG/2ML IJ SOLN
INTRAMUSCULAR | Status: DC | PRN
  Administered 2017-12-09: 50 ug via INTRAVENOUS
  Administered 2017-12-09 (×2): 25 ug via INTRAVENOUS

## 2017-12-09 MED ORDER — SODIUM CHLORIDE 0.9 % IJ SOLN
INTRAVENOUS | Status: DC | PRN
  Administered 2017-12-09: 5 mL via INTRAMUSCULAR

## 2017-12-09 MED ORDER — FENTANYL CITRATE (PF) 100 MCG/2ML IJ SOLN
50.0000 ug | INTRAMUSCULAR | Status: DC | PRN
  Administered 2017-12-09 (×2): 50 ug via INTRAVENOUS

## 2017-12-09 MED ORDER — CHLORHEXIDINE GLUCONATE CLOTH 2 % EX PADS: 6.0000 | MEDICATED_PAD | Freq: Once | CUTANEOUS | Status: DC

## 2017-12-09 MED ORDER — TECHNETIUM TC 99M SULFUR COLLOID FILTERED
1.0000 | Freq: Once | INTRAVENOUS | Status: AC | PRN
  Administered 2017-12-09: 1 via INTRADERMAL

## 2017-12-09 MED ORDER — DEXAMETHASONE SODIUM PHOSPHATE 10 MG/ML IJ SOLN
INTRAMUSCULAR | Status: AC
  Filled 2017-12-09: qty 1

## 2017-12-09 MED ORDER — EPHEDRINE 5 MG/ML INJ
INTRAVENOUS | Status: AC
  Filled 2017-12-09: qty 20

## 2017-12-09 MED ORDER — EPHEDRINE SULFATE 50 MG/ML IJ SOLN
INTRAMUSCULAR | Status: DC | PRN
  Administered 2017-12-09: 10 mg via INTRAVENOUS

## 2017-12-09 MED ORDER — FENTANYL CITRATE (PF) 100 MCG/2ML IJ SOLN
INTRAMUSCULAR | Status: AC
  Filled 2017-12-09: qty 2

## 2017-12-09 MED ORDER — LACTATED RINGERS IV SOLN
INTRAVENOUS | Status: DC
  Administered 2017-12-09 (×2): via INTRAVENOUS

## 2017-12-09 MED ORDER — SUCCINYLCHOLINE CHLORIDE 200 MG/10ML IV SOSY
PREFILLED_SYRINGE | INTRAVENOUS | Status: AC
  Filled 2017-12-09: qty 10

## 2017-12-09 MED ORDER — BUPIVACAINE-EPINEPHRINE (PF) 0.5% -1:200000 IJ SOLN
INTRAMUSCULAR | Status: DC | PRN
  Administered 2017-12-09: 30 mL via PERINEURAL

## 2017-12-09 MED ORDER — CELECOXIB 200 MG PO CAPS
200.0000 mg | ORAL_CAPSULE | ORAL | Status: AC
  Administered 2017-12-09: 200 mg via ORAL

## 2017-12-09 MED ORDER — MIDAZOLAM HCL 5 MG/5ML IJ SOLN
INTRAMUSCULAR | Status: DC | PRN
  Administered 2017-12-09: 2 mg via INTRAVENOUS

## 2017-12-09 MED ORDER — MIDAZOLAM HCL 2 MG/2ML IJ SOLN
INTRAMUSCULAR | Status: AC
  Filled 2017-12-09: qty 2

## 2017-12-09 MED ORDER — CLONIDINE HCL (ANALGESIA) 100 MCG/ML EP SOLN
EPIDURAL | Status: DC | PRN
  Administered 2017-12-09: 100 ug

## 2017-12-09 MED ORDER — MIDAZOLAM HCL 2 MG/2ML IJ SOLN
1.0000 mg | INTRAMUSCULAR | Status: DC | PRN
  Administered 2017-12-09: 2 mg via INTRAVENOUS

## 2017-12-09 MED ORDER — GABAPENTIN 300 MG PO CAPS
ORAL_CAPSULE | ORAL | Status: AC
  Filled 2017-12-09: qty 1

## 2017-12-09 MED ORDER — PROPOFOL 10 MG/ML IV BOLUS
INTRAVENOUS | Status: DC | PRN
  Administered 2017-12-09: 150 mg via INTRAVENOUS

## 2017-12-09 MED ORDER — ONDANSETRON HCL 4 MG/2ML IJ SOLN
INTRAMUSCULAR | Status: DC | PRN
  Administered 2017-12-09: 4 mg via INTRAVENOUS

## 2017-12-09 MED ORDER — LIDOCAINE HCL (CARDIAC) PF 100 MG/5ML IV SOSY
PREFILLED_SYRINGE | INTRAVENOUS | Status: DC | PRN
  Administered 2017-12-09: 30 mg via INTRAVENOUS

## 2017-12-09 MED ORDER — GABAPENTIN 300 MG PO CAPS
300.0000 mg | ORAL_CAPSULE | ORAL | Status: AC
  Administered 2017-12-09: 300 mg via ORAL

## 2017-12-09 MED ORDER — CELECOXIB 200 MG PO CAPS
ORAL_CAPSULE | ORAL | Status: AC
  Filled 2017-12-09: qty 1

## 2017-12-09 MED ORDER — SCOPOLAMINE 1 MG/3DAYS TD PT72
1.0000 | MEDICATED_PATCH | Freq: Once | TRANSDERMAL | Status: DC
  Administered 2017-12-09: 1.5 mg via TRANSDERMAL

## 2017-12-09 MED ORDER — SCOPOLAMINE 1 MG/3DAYS TD PT72
MEDICATED_PATCH | TRANSDERMAL | Status: AC
  Filled 2017-12-09: qty 1

## 2017-12-09 MED ORDER — SODIUM CHLORIDE 0.9 % IJ SOLN
INTRAMUSCULAR | Status: AC
  Filled 2017-12-09: qty 10

## 2017-12-09 MED ORDER — DEXAMETHASONE SODIUM PHOSPHATE 4 MG/ML IJ SOLN
INTRAMUSCULAR | Status: DC | PRN
  Administered 2017-12-09: 10 mg via INTRAVENOUS

## 2017-12-09 MED ORDER — ACETAMINOPHEN 500 MG PO TABS
ORAL_TABLET | ORAL | Status: AC
  Filled 2017-12-09: qty 2

## 2017-12-09 MED ORDER — CEFAZOLIN SODIUM-DEXTROSE 2-4 GM/100ML-% IV SOLN
INTRAVENOUS | Status: AC
  Filled 2017-12-09: qty 100

## 2017-12-09 MED ORDER — PROMETHAZINE HCL 25 MG/ML IJ SOLN: 6.2500 mg | INTRAMUSCULAR | Status: DC | PRN

## 2017-12-09 MED ORDER — ONDANSETRON HCL 4 MG/2ML IJ SOLN
INTRAMUSCULAR | Status: AC
  Filled 2017-12-09: qty 2

## 2017-12-09 MED ORDER — CEFAZOLIN SODIUM-DEXTROSE 2-4 GM/100ML-% IV SOLN
2.0000 g | INTRAVENOUS | Status: AC
  Administered 2017-12-09: 2 g via INTRAVENOUS

## 2017-12-09 MED ORDER — METHYLENE BLUE 0.5 % INJ SOLN
INTRAVENOUS | Status: AC
  Filled 2017-12-09: qty 10

## 2017-12-09 MED ORDER — PROPOFOL 500 MG/50ML IV EMUL
INTRAVENOUS | Status: AC
  Filled 2017-12-09: qty 50

## 2017-12-09 SURGICAL SUPPLY — 53 items
ADH SKN CLS APL DERMABOND .7 (GAUZE/BANDAGES/DRESSINGS) ×1
APPLIER CLIP 9.375 MED OPEN (MISCELLANEOUS)
APR CLP MED 9.3 20 MLT OPN (MISCELLANEOUS)
BLADE CLIPPER SURG (BLADE) IMPLANT
BLADE SURG 10 STRL SS (BLADE) ×1 IMPLANT
BLADE SURG 15 STRL LF DISP TIS (BLADE) ×1 IMPLANT
BLADE SURG 15 STRL SS (BLADE) ×3
CANISTER SUCT 1200ML W/VALVE (MISCELLANEOUS) ×3 IMPLANT
CHLORAPREP W/TINT 26ML (MISCELLANEOUS) ×3 IMPLANT
CLIP APPLIE 9.375 MED OPEN (MISCELLANEOUS) IMPLANT
COVER BACK TABLE 60X90IN (DRAPES) ×3 IMPLANT
COVER MAYO STAND STRL (DRAPES) ×3 IMPLANT
DECANTER SPIKE VIAL GLASS SM (MISCELLANEOUS) IMPLANT
DERMABOND ADVANCED (GAUZE/BANDAGES/DRESSINGS) ×2
DERMABOND ADVANCED .7 DNX12 (GAUZE/BANDAGES/DRESSINGS) ×2 IMPLANT
DRAIN CHANNEL 19F RND (DRAIN) IMPLANT
DRAIN HEMOVAC 1/8 X 5 (WOUND CARE) IMPLANT
DRAPE LAPAROSCOPIC ABDOMINAL (DRAPES) ×3 IMPLANT
DRAPE UTILITY XL STRL (DRAPES) ×3 IMPLANT
ELECT COATED BLADE 2.86 ST (ELECTRODE) ×3 IMPLANT
ELECT REM PT RETURN 9FT ADLT (ELECTROSURGICAL) ×3
ELECTRODE REM PT RTRN 9FT ADLT (ELECTROSURGICAL) ×1 IMPLANT
EVACUATOR SILICONE 100CC (DRAIN) IMPLANT
GAUZE SPONGE 4X4 12PLY STRL LF (GAUZE/BANDAGES/DRESSINGS) IMPLANT
GLOVE BIO SURGEON STRL SZ 6.5 (GLOVE) ×2 IMPLANT
GLOVE BIO SURGEONS STRL SZ 6.5 (GLOVE) ×2
GLOVE BIOGEL PI IND STRL 8 (GLOVE) ×1 IMPLANT
GLOVE BIOGEL PI INDICATOR 8 (GLOVE) ×2
GLOVE ECLIPSE 8.0 STRL XLNG CF (GLOVE) ×3 IMPLANT
GOWN STRL REUS W/ TWL LRG LVL3 (GOWN DISPOSABLE) ×2 IMPLANT
GOWN STRL REUS W/TWL LRG LVL3 (GOWN DISPOSABLE) ×6
HEMOSTAT ARISTA ABSORB 3G PWDR (MISCELLANEOUS) IMPLANT
HEMOSTAT SURGICEL 2X14 (HEMOSTASIS) ×4 IMPLANT
NDL HYPO 25X1 1.5 SAFETY (NEEDLE) ×1 IMPLANT
NEEDLE HYPO 25X1 1.5 SAFETY (NEEDLE) ×3 IMPLANT
NS IRRIG 1000ML POUR BTL (IV SOLUTION) ×1 IMPLANT
PACK BASIN DAY SURGERY FS (CUSTOM PROCEDURE TRAY) ×3 IMPLANT
PENCIL BUTTON HOLSTER BLD 10FT (ELECTRODE) ×3 IMPLANT
PIN SAFETY STERILE (MISCELLANEOUS) IMPLANT
SLEEVE SCD COMPRESS KNEE MED (MISCELLANEOUS) ×3 IMPLANT
SPONGE LAP 18X18 RF (DISPOSABLE) IMPLANT
SPONGE LAP 4X18 RFD (DISPOSABLE) ×3 IMPLANT
SUT ETHILON 3 0 PS 1 (SUTURE) IMPLANT
SUT MNCRL AB 3-0 PS2 18 (SUTURE) ×4 IMPLANT
SUT SILK 2 0 SH (SUTURE) IMPLANT
SUT VICRYL 3-0 CR8 SH (SUTURE) ×3 IMPLANT
SYR BULB 3OZ (MISCELLANEOUS) ×1 IMPLANT
SYR CONTROL 10ML LL (SYRINGE) ×6 IMPLANT
TOWEL GREEN STERILE FF (TOWEL DISPOSABLE) ×3 IMPLANT
TOWEL OR NON WOVEN STRL DISP B (DISPOSABLE) ×1 IMPLANT
TUBE CONNECTING 20'X1/4 (TUBING) ×1
TUBE CONNECTING 20X1/4 (TUBING) ×2 IMPLANT
YANKAUER SUCT BULB TIP NO VENT (SUCTIONS) ×3 IMPLANT

## 2017-12-09 NOTE — Interval H&P Note (Signed)
History and Physical Interval Note:  12/09/2017 9:47 AM  Bethany [name]  has presented today for surgery, with the diagnosis of LEFT BREAST CANCER  The various methods of treatment have been discussed with the patient and family. After consideration of risks, benefits and other options for treatment, the patient has consented to  Procedure(s): AXILLARY SENTINEL NODE BIOPSY (Left) as a surgical intervention .  The patient's history has been reviewed, patient examined, no change in status, stable for surgery.  I have reviewed the patient's chart and labs.  Questions were answered to the patient's satisfaction.     [place]

## 2017-12-09 NOTE — Op Note (Signed)
Preoperative diagnosis: Left breast cancer  Postoperative diagnosis: Same  Procedure: Left axillary sentinel lymph node mapping with methylene blue dye  Surgeon: Erroll Luna, MD  Anesthesia: LMA with local  EBL: 10 cc  Specimen: 3 blue and hot left axillary sentinel nodes  Drains: None  IV fluids: Per anesthesia record  Indications for procedure: The patient presents for left axillary sentinel lymph node mapping secondary to excisional biopsy which showed left breast cancer.  Margins were negative and she opted for breast conservation.  To complete her staging recommend sentinel lymph node mapping.  Risk, benefits and other options discussed with the patient.Sentinel lymph node mapping and dissection has been discussed with the patient.  Risk of bleeding,  Infection,  Seroma formation,  Additional procedures,,  Shoulder weakness , arm swelling  Shoulder stiffness,  Nerve and blood vessel injury and reaction to the mapping dyes have been discussed.  Alternatives to surgery have been discussed with the patient.  The patient agrees to proceed.   Description of procedure: The patient was brought to the operating room from the holding area after undergoing injection of technetium sulfur colloid left breast for mapping.  Questions were answered left breast marked as correct side.  She was placed supine upon the operating room table.  After induction of LMA anesthesia, left breast was prepped and draped in sterile fashion timeout was done.  4 cc of methylene blue dye were injected under the left nipple.  Massage was done.  Neoprobe was used and hot spot identified in the left axilla.  3 cm incision was made in the left axilla.  Dissection was carried down into the left x-ray contents.  There were 3 deep left axillary sentinel nodes were removed which were blue and hot.  Background counts approaches 0.  Irrigation was used and local anesthetic was infiltrated.  Wound closed with 3-0 Vicryl and 4-0  Monocryl.  Dermabond applied.  All final counts found to be correct.  The patient awoke extubated taken to recovery in satisfactory condition.

## 2017-12-09 NOTE — Discharge Instructions (Signed)
[place] Office Phone Number [phone]  BREAST BIOPSY: POST OP INSTRUCTIONS  Always review your discharge instruction sheet given to you by the facility where your surgery was performed.  IF YOU HAVE DISABILITY OR FAMILY LEAVE FORMS, YOU MUST BRING THEM TO THE OFFICE FOR PROCESSING.  DO NOT GIVE THEM TO YOUR DOCTOR.  1. A prescription for pain medication may be given to you upon discharge.  Take your pain medication as prescribed, if needed.  If narcotic pain medicine is not needed, then you may take acetaminophen (Tylenol) or ibuprofen (Advil) as needed. 2. Take your usually prescribed medications unless otherwise directed 3. If you need a refill on your pain medication, please contact your pharmacy.  They will contact our office to request authorization.  Prescriptions will not be filled after 5pm or on week-ends. 4. You should eat very light the first 24 hours after surgery, such as soup, crackers, pudding, etc.  Resume your normal diet the day after surgery. 5. Most patients will experience some swelling and bruising in the breast.  Ice packs and a good support bra will help.  Swelling and bruising can take several days to resolve.  6. It is common to experience some constipation if taking pain medication after surgery.  Increasing fluid intake and taking a stool softener will usually help or prevent this problem from occurring.  A mild laxative (Milk of Magnesia or Miralax) should be taken according to package directions if there are no bowel movements after 48 hours. 7. Unless discharge instructions indicate otherwise, you may remove your bandages 24-48 hours after surgery, and you may shower at that time.  You may have steri-strips (small skin tapes) in place directly over the incision.  These strips should be left on the skin for 7-10 days.  If your surgeon used skin glue on the incision, you may shower in 24 hours.  The glue will flake off over the next 2-3 weeks.  Any  sutures or staples will be removed at the office during your follow-up visit. 8. ACTIVITIES:  You may resume regular daily activities (gradually increasing) beginning the next day.  Wearing a good support bra or sports bra minimizes pain and swelling.  You may have sexual intercourse when it is comfortable. a. You may drive when you no longer are taking prescription pain medication, you can comfortably wear a seatbelt, and you can safely maneuver your car and apply brakes. b. RETURN TO WORK:  ______________________________________________________________________________________ 9. You should see your doctor in the office for a follow-up appointment approximately two weeks after your surgery.  Your doctors nurse will typically make your follow-up appointment when she calls you with your pathology report.  Expect your pathology report 2-3 business days after your surgery.  You may call to check if you do not hear from [place] after three days. 10. OTHER INSTRUCTIONS: _______________________________________________________________________________________________ _____________________________________________________________________________________________________________________________________ _____________________________________________________________________________________________________________________________________ _____________________________________________________________________________________________________________________________________  WHEN TO CALL YOUR DOCTOR: 1. Fever over 101.0 2. Nausea and/or vomiting. 3. Extreme swelling or bruising. 4. Continued bleeding from incision. 5. Increased pain, redness, or drainage from the incision.  The clinic staff is available to answer your questions during regular business hours.  Please dont hesitate to call and ask to speak to one of the nurses for clinical concerns.  If you have a medical emergency, go to the nearest emergency room or call  911.  A surgeon from [facility] is always on call at the hospital.  For further questions, please visit [email or web address]  Post Anesthesia Home Care Instructions  Activity: Get plenty of rest for the remainder of the day. A responsible individual must stay with you for 24 hours following the procedure.  For the next 24 hours, DO NOT: -Drive a car -Paediatric nurse -Drink alcoholic beverages -Take any medication unless instructed by your physician -Make any legal decisions or sign important papers.  Meals: Start with liquid foods such as gelatin or soup. Progress to regular foods as tolerated. Avoid greasy, spicy, heavy foods. If nausea and/or vomiting occur, drink only clear liquids until the nausea and/or vomiting subsides. Call your physician if vomiting continues.  Special Instructions/Symptoms: Your throat may feel dry or sore from the anesthesia or the breathing tube placed in your throat during surgery. If this causes discomfort, gargle with warm salt water. The discomfort should disappear within 24 hours.  If you had a scopolamine patch placed behind your ear for the management of post- operative nausea and/or vomiting:  1. The medication in the patch is effective for 72 hours, after which it should be removed.  Wrap patch in a tissue and discard in the trash. Wash hands thoroughly with soap and water. 2. You may remove the patch earlier than 72 hours if you experience unpleasant side effects which may include dry mouth, dizziness or visual disturbances. 3. Avoid touching the patch. Wash your hands with soap and water after contact with the patch.    Regional Anesthesia Blocks  1. Numbness or the inability to move the "blocked" extremity may last from 3-48 hours after placement. The length of time depends on the medication injected and your individual response to the medication. If the numbness is not going away after 48 hours, call your  surgeon.  2. The extremity that is blocked will need to be protected until the numbness is gone and the  Strength has returned. Because you cannot feel it, you will need to take extra care to avoid injury. Because it may be weak, you may have difficulty moving it or using it. You may not know what position it is in without looking at it while the block is in effect.  3. For blocks in the legs and feet, returning to weight bearing and walking needs to be done carefully. You will need to wait until the numbness is entirely gone and the strength has returned. You should be able to move your leg and foot normally before you try and bear weight or walk. You will need someone to be with you when you first try to ensure you do not fall and possibly risk injury.  4. Bruising and tenderness at the needle site are common side effects and will resolve in a few days.  5. Persistent numbness or new problems with movement should be communicated to the surgeon or the [facility] [phone]).     Post Anesthesia Home Care Instructions  Activity: Get plenty of rest for the remainder of the day. A responsible individual must stay with you for 24 hours following the procedure.  For the next 24 hours, DO NOT: -Drive a car -Paediatric nurse -Drink alcoholic beverages -Take any medication unless instructed by your physician -Make any legal decisions or sign important papers.  Meals: Start with liquid foods such as gelatin or soup. Progress to regular foods as tolerated. Avoid greasy, spicy, heavy foods. If nausea and/or vomiting occur, drink only clear liquids until the nausea and/or vomiting subsides. Call your physician if vomiting continues.  Special Instructions/Symptoms: Your throat may feel dry or sore from the anesthesia or the breathing tube placed in your throat during surgery. If this causes discomfort, gargle with warm salt water. The  discomfort should disappear within 24 hours.  If you had a scopolamine patch placed behind your ear for the management of post- operative nausea and/or vomiting:  1. The medication in the patch is effective for 72 hours, after which it should be removed.  Wrap patch in a tissue and discard in the trash. Wash hands thoroughly with soap and water. 2. You may remove the patch earlier than 72 hours if you experience unpleasant side effects which may include dry mouth, dizziness or visual disturbances. 3. Avoid touching the patch. Wash your hands with soap and water after contact with the patch.     Regional Anesthesia Blocks  1. Numbness or the inability to move the "blocked" extremity may last from 3-48 hours after placement. The length of time depends on the medication injected and your individual response to the medication. If the numbness is not going away after 48 hours, call your surgeon.  2. The extremity that is blocked will need to be protected until the numbness is gone and the  Strength has returned. Because you cannot feel it, you will need to take extra care to avoid injury. Because it may be weak, you may have difficulty moving it or using it. You may not know what position it is in without looking at it while the block is in effect.  3. For blocks in the legs and feet, returning to weight bearing and walking needs to be done carefully. You will need to wait until the numbness is entirely gone and the strength has returned. You should be able to move your leg and foot normally before you try and bear weight or walk. You will need someone to be with you when you first try to ensure you do not fall and possibly risk injury.  4. Bruising and tenderness at the needle site are common side effects and will resolve in a few days.  5. Persistent numbness or new problems with movement should be communicated to the surgeon or the [place] [phone] [place] [phone]).

## 2017-12-09 NOTE — Anesthesia Procedure Notes (Signed)
Anesthesia Regional Block: Pectoralis block   Pre-Anesthetic Checklist: ,, timeout performed, Correct Patient, Correct Site, Correct Laterality, Correct Procedure, Correct Position, site marked, Risks and benefits discussed,  Surgical consent,  Pre-op evaluation,  At surgeon's request and post-op pain management  Laterality: Left  Prep: chloraprep       Needles:  Injection technique: Single-shot  Needle Type: Echogenic Needle     Needle Length: 9cm  Needle Gauge: 21     Additional Needles:   Procedures:,,,, ultrasound used (permanent image in chart),,,,  Narrative:  Start time: 12/09/2017 9:40 AM End time: 12/09/2017 9:45 AM Injection made incrementally with aspirations every 5 mL.  Performed by: Personally  Anesthesiologist: Catalina Gravel, MD  Additional Notes: No pain on injection. No increased resistance to injection. Injection made in 5cc increments.  Good needle visualization.  Patient tolerated procedure well.

## 2017-12-09 NOTE — Anesthesia Procedure Notes (Signed)
Procedure Name: LMA Insertion Date/Time: 12/09/2017 10:10 AM Performed by: Marrianne Mood, CRNA Pre-anesthesia Checklist: Patient identified, Emergency Drugs available, Suction available, Patient being monitored and Timeout performed Patient Re-evaluated:Patient Re-evaluated prior to induction Oxygen Delivery Method: Circle system utilized Preoxygenation: Pre-oxygenation with 100% oxygen Induction Type: IV induction Ventilation: Mask ventilation without difficulty LMA: LMA inserted LMA Size: 4.0 Number of attempts: 1 Airway Equipment and Method: Bite block Placement Confirmation: positive ETCO2 Tube secured with: Tape Dental Injury: Teeth and Oropharynx as per pre-operative assessment

## 2017-12-09 NOTE — Anesthesia Preprocedure Evaluation (Addendum)
Anesthesia Evaluation  Patient identified by MRN, date of birth, ID band Patient awake    Reviewed: Allergy & Precautions, NPO status , Patient's Chart, lab work & pertinent test results  History of Anesthesia Complications (+) PONV and history of anesthetic complications  Airway Mallampati: III  TM Distance: <3 FB Neck ROM: Full    Dental  (+) Dental Advisory Given, Missing   Pulmonary former smoker,    Pulmonary exam normal breath sounds clear to auscultation       Cardiovascular hypertension, Pt. on medications Normal cardiovascular exam+ Valvular Problems/Murmurs MVP  Rhythm:Regular Rate:Normal     Neuro/Psych PSYCHIATRIC DISORDERS Anxiety negative neurological ROS     GI/Hepatic Neg liver ROS, hiatal hernia,   Endo/Other  Hypothyroidism Obesity   Renal/GU negative Renal ROS     Musculoskeletal negative musculoskeletal ROS (+)   Abdominal   Peds  Hematology negative hematology ROS (+)   Anesthesia Other Findings Day of surgery medications reviewed with the patient.  LEFT BREAST CANCER  Reproductive/Obstetrics                            Anesthesia Physical Anesthesia Plan  ASA: II  Anesthesia Plan: General   Post-op Pain Management:  Regional for Post-op pain   Induction: Intravenous  PONV Risk Score and Plan: 4 or greater and Scopolamine patch - Pre-op, Midazolam, Dexamethasone and Ondansetron  Airway Management Planned: LMA  Additional Equipment:   Intra-op Plan:   Post-operative Plan: Extubation in OR  Informed Consent: I have reviewed the patients History and Physical, chart, labs and discussed the procedure including the risks, benefits and alternatives for the proposed anesthesia with the patient or authorized representative who has indicated his/her understanding and acceptance.   Dental advisory given  Plan Discussed with: CRNA  Anesthesia Plan Comments:          Anesthesia Quick Evaluation

## 2017-12-09 NOTE — H&P (Signed)
Bethany [name] is an 57 y.o. female.   Chief Complaint: left breast cancer HPI: Pt presents for SLN mapping left breast after lumpectomy revealed left breast cancer  Past Medical History:  Diagnosis Date  . Anxiety   . Breast mass, left 11/2017  . Dental crowns present   . History of hiatal hernia    no current med.  . Hypertension    states under control with med., has been on med. x 1 yr.  . Hypothyroidism   . MVP (mitral valve prolapse)   . PONV (postoperative nausea and vomiting)   . Vitamin D deficiency     Past Surgical History:  Procedure Laterality Date  . ABDOMINAL HYSTERECTOMY     partial  . APPENDECTOMY    . BREAST LUMPECTOMY WITH NEEDLE LOCALIZATION Left 11/25/2017   Procedure: LEFT BREAST LUMPECTOMY WITH NEEDLE LOCALIZATION;  Surgeon: Erroll Luna, MD;  Location: [facility];  Service: General;  Laterality: Left;  . COLONOSCOPY WITH PROPOFOL  10/03/2015  . LAPAROSCOPIC APPENDECTOMY N/A 10/03/2015   Procedure: APPENDECTOMY LAPAROSCOPIC;  Surgeon: Rolm Bookbinder, MD;  Location: [facility];  Service: General;  Laterality: N/A;  . TOTAL THYROIDECTOMY  04/02/2003    Family History  Problem Relation Age of Onset  . Hypertension Father   . Diabetes Father   . Heart disease Father   . Hypertension Brother   . Heart disease Paternal Grandfather   . Colon cancer Other        great maternal aunt   . Colon polyps Mother    Social History:  reports that she quit smoking about 3 years ago. She has never used smokeless tobacco. She reports that she drinks alcohol. She reports that she does not use drugs.  Allergies:  Allergies  Allergen Reactions  . Oxycodone Nausea And Vomiting  . Latex Rash    Medications Prior to Admission  Medication Sig Dispense Refill  . ALPRAZolam (XANAX) 0.5 MG tablet Take one tablet the night before procedure, then one tablet morning of procedure 2 tablet 0  . ergocalciferol (VITAMIN D2) 50000 UNITS capsule Take 50,000 Units  by mouth 2 (two) times a week. On Thurs & Sun    . HYDROcodone-acetaminophen (NORCO/VICODIN) 5-325 MG tablet Take 1 tablet by mouth every 6 (six) hours as needed for moderate pain. 10 tablet 0  . ibuprofen (ADVIL,MOTRIN) 800 MG tablet Take 1 tablet (800 mg total) by mouth every 8 (eight) hours as needed. 30 tablet 0  . levothyroxine (SYNTHROID, LEVOTHROID) 137 MCG tablet Take 150 mcg by mouth at bedtime.     Marland Kitchen losartan (COZAAR) 25 MG tablet Take 25 mg by mouth daily.    . phentermine 37.5 MG capsule Take 1 capsule (37.5 mg total) by mouth every morning. 30 capsule 1    No results found for this or any previous visit (from the past 48 hour(s)). No results found.  Review of Systems  All other systems reviewed and are negative.   Blood pressure 134/75, temperature 97.8 F (36.6 C), temperature source Oral, resp. rate 20, height 5\' 6"  (1.676 m), weight 91.1 kg, SpO2 99 %. Physical Exam  Constitutional: She appears well-developed.  HENT:  Head: Normocephalic.  Eyes: Pupils are equal, round, and reactive to light.  Neck: Normal range of motion.  Cardiovascular: Normal rate.  Respiratory: Effort normal.  Left breast incision CDI  Musculoskeletal: Normal range of motion.  Skin: Skin is warm.     Assessment/Plan LEFT BREAST CANCER  SLN mapping for  completion of surgery Sentinel lymph node mapping and dissection has been discussed with the patient.  Risk of bleeding,  Infection,  Seroma formation,  Additional procedures,,  Shoulder weakness ,  Arm swelling, Shoulder stiffness,  Nerve and blood vessel injury and reaction to the mapping dyes have been discussed.  Alternatives to surgery have been discussed with the patient.  The patient agrees to proceed.  Turner Daniels, MD 12/09/2017, 9:44 AM

## 2017-12-09 NOTE — Anesthesia Postprocedure Evaluation (Signed)
Anesthesia Post Note  Patient: Bethany [name]  Procedure(s) Performed: AXILLARY SENTINEL NODE BIOPSY (Left Axilla)     Patient location during evaluation: PACU Anesthesia Type: General Level of consciousness: awake and alert, awake and oriented Pain management: pain level controlled Vital Signs Assessment: post-procedure vital signs reviewed and stable Respiratory status: spontaneous breathing, nonlabored ventilation and respiratory function stable Cardiovascular status: blood pressure returned to baseline and stable Postop Assessment: no apparent nausea or vomiting Anesthetic complications: no    Last Vitals:  Vitals:   12/09/17 1215 12/09/17 1248  BP: 134/83 (!) 146/86  Pulse: 75 74  Resp: 13 18  Temp:  36.5 C  SpO2: 98% 98%    Last Pain:  Vitals:   12/09/17 1248  TempSrc:   PainSc: 4                  Catalina Gravel

## 2017-12-09 NOTE — Progress Notes (Signed)
Assisted Dr. Turk with left, ultrasound guided, pectoralis block. Side rails up, monitors on throughout procedure. See vital signs in flow sheet. Tolerated Procedure well. 

## 2017-12-09 NOTE — Transfer of Care (Signed)
Immediate Anesthesia Transfer of Care Note  Patient: Bethany [name]  Procedure(s) Performed: AXILLARY SENTINEL NODE BIOPSY (Left Axilla)  Patient Location: PACU  Anesthesia Type:GA combined with regional for post-op pain  Level of Consciousness: awake, sedated and patient cooperative  Airway & Oxygen Therapy: Patient Spontanous Breathing and Patient connected to face mask oxygen  Post-op Assessment: Report given to RN and Post -op Vital signs reviewed and stable  Post vital signs: Reviewed and stable  Last Vitals:  Vitals Value Taken Time  BP    Temp    Pulse    Resp    SpO2      Last Pain:  Vitals:   12/09/17 0855  TempSrc: Oral  PainSc: 0-No pain         Complications: No apparent anesthesia complications

## 2017-12-10 ENCOUNTER — Encounter (HOSPITAL_BASED_OUTPATIENT_CLINIC_OR_DEPARTMENT_OTHER): Payer: Self-pay | Admitting: Surgery

## 2017-12-15 ENCOUNTER — Encounter: Payer: Self-pay | Admitting: Adult Health

## 2017-12-15 ENCOUNTER — Other Ambulatory Visit: Payer: Self-pay

## 2017-12-15 DIAGNOSIS — Z85850 Personal history of malignant neoplasm of thyroid: Secondary | ICD-10-CM

## 2017-12-15 DIAGNOSIS — Z170 Estrogen receptor positive status [ER+]: Principal | ICD-10-CM

## 2017-12-15 DIAGNOSIS — C50012 Malignant neoplasm of nipple and areola, left female breast: Secondary | ICD-10-CM

## 2017-12-15 HISTORY — DX: Personal history of malignant neoplasm of thyroid: Z85.850

## 2017-12-15 NOTE — Progress Notes (Signed)
Bethany [name]  Telephone:([phone]) [phone] Fax:(336) [phone]     ID: Bethany [name] DOB: Dec 30, 1960  MR#: [id number]  XLK#:[id number]  Patient Care Team: Leeroy Cha, MD as PCP - General (Internal Medicine) Erroll Luna, MD as Consulting Physician (General Surgery) Enda Santo, Virgie Dad, MD as Consulting Physician (Oncology) Kyung Rudd, MD as Consulting Physician (Radiation Oncology) Irene Shipper, MD as Consulting Physician (Gastroenterology) [name], MD as Consulting Physician (Obstetrics and Gynecology) Delrae Rend, MD as Consulting Physician (Endocrinology) OTHER MD:   CHIEF COMPLAINT: Estrogen receptor positive breast cancer  CURRENT TREATMENT: Adjuvant radiation pending   HISTORY OF CURRENT ILLNESS: "Bethany [name]" had routine screening mammography on 10/26/2017 showing a possible abnormality in the left breast. She underwent unilateral left diagnostic mammography with tomography and left breast ultrasonography at [facility] on 10/21/2017 showing: breast density category B. There was a hypoechoic lesion consistent of a mass at the 2:30 o'clock upper outer quadrant middle depth and measuring 0.5 x 0.3 x 0.4 cm. Sonographic evaluation of the left axilla shows no enlarged or abnormal lymph nodes.  An attempt was made to obtain a biopsy of this lesion on 10/17/2017, however the patient had repeated episodes of syncope during the attempted procedure.  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 Margins were negative for carcinoma. Prognostic indicators significant for: estrogen receptor, 90% positive and progesterone receptor, 80% positive, both with strong staining intensity. Proliferation marker Ki67 at 3%. HER2 negative with an immunohistochemistry of (1+).  Then in a separate procedure she underwent left sentinel lymph node sampling on 12/09/2017 showing  ([id number]): Four left axillary sentinel lymph were negative for carcinoma. (0/4).   Her subsequent history is as detailed below.  INTERVAL HISTORY: The patient was evaluated in the breast cancer clinic on 12/16/2017. Her case was also presented at the multidisciplinary breast cancer conference on 12/15/2017. At that time a preliminary plan was proposed: Oncotype testing, adjuvant radiation, antiestrogens  REVIEW OF SYSTEMS: There were no specific symptoms leading to the original mammogram, which was routinely scheduled. She notes that she is sore form her lumpectomy and lymph node sampling. She notes that it is easy for her to gain weight because she does not have a thyroid. She has gained 13 lbs. She notes that she had some reflux the other day. The patient denies unusual headaches, visual changes, nausea, vomiting, stiff neck, dizziness, or gait imbalance. There has been no cough, phlegm production, or pleurisy, no chest pain or pressure, and no change in bowel or bladder habits. The patient denies fever, rash, bleeding, unexplained fatigue or unexplained weight loss. A detailed review of systems was otherwise entirely negative.   PAST MEDICAL HISTORY: Past Medical History:  Diagnosis Date  . Anxiety   . Breast mass, left 11/2017  . Dental crowns present   . History of hiatal hernia    no current med.  . Hypertension    states under control with med., has been on med. x 1 yr.  . Hypothyroidism   . MVP (mitral valve prolapse)   . PONV (postoperative nausea and vomiting)   . Vitamin D deficiency   Thyroid cancer, GERD  PAST SURGICAL HISTORY: Past Surgical History:  Procedure Laterality Date  . ABDOMINAL HYSTERECTOMY     partial  . APPENDECTOMY    . AXILLARY SENTINEL NODE BIOPSY Left 12/09/2017   Procedure: AXILLARY SENTINEL NODE BIOPSY;  Surgeon: Erroll Luna, MD;  Location: [facility];  Service: General;  Laterality: Left;  . BREAST LUMPECTOMY WITH NEEDLE  LOCALIZATION Left 11/25/2017   Procedure: LEFT BREAST LUMPECTOMY WITH NEEDLE LOCALIZATION;  Surgeon: Erroll Luna, MD;  Location: [facility];  Service: General;  Laterality: Left;  . COLONOSCOPY WITH PROPOFOL  10/03/2015  . LAPAROSCOPIC APPENDECTOMY N/A 10/03/2015   Procedure: APPENDECTOMY LAPAROSCOPIC;  Surgeon: Rolm Bookbinder, MD;  Location: [facility];  Service: General;  Laterality: N/A;  . TOTAL THYROIDECTOMY  04/02/2003  Hysterectomy without BSO  FAMILY HISTORY Family History  Problem Relation Age of Onset  . Hypertension Father   . Diabetes Father   . Heart disease Father   . Hypertension Brother   . Heart disease Paternal Grandfather   . Colon cancer Other        great maternal aunt   . Colon polyps Mother   The patient's father died in 04/04/2017 age age 21 due to non-Hodgkin's lymphoma. The patient's mother is alive at age 31 as of October 2019. The patient has 1 brother, no sisters. There was a paternal aunt with lung cancer. There was a paternal grandmother and maternal grandfather with throat cancer. She denies a family history of breast or ovarian cancer.    GYNECOLOGIC HISTORY:  No LMP recorded. Patient has had a hysterectomy. Menarche: 57 years old Age at first live birth: 57 years old She is GX P3.  She is status post partial hysterectomy (without BSO) in her late 20's. She did not have HRT.    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  Her husband, Patrick Jupiter  is a Clinical cytogeneticist for [employer]. The patient's oldest son, Sherrye Payor has 3 children and lives in [place] and works in Nurse, children's for [employer]. The patient's son, Alpha Gula lives in [place] with 1 daughter and works as a Teacher, music. The patient's youngest son, Aline Brochure lives in [place] and works for [employer] in [place]. The patient has 4 grandchildren total. She does not currently belong to a church, but she is Psychologist, forensic.       ADVANCED DIRECTIVES:    HEALTH MAINTENANCE: Social  History   Tobacco Use  . Smoking status: Former Smoker    Last attempt to quit: 08/14/2014    Years since quitting: 3.3  . Smokeless tobacco: Never Used  Substance Use Topics  . Alcohol use: Yes    Comment: rarely  . Drug use: No     Colonoscopy: Due in December 2019- to be repeated in 3 years under Dr. Henrene Pastor  PAP:   Bone density: 10/25/2017 showed osteopenia   Allergies  Allergen Reactions  . Oxycodone Nausea And Vomiting  . Latex Rash    Current Outpatient Medications  Medication Sig Dispense Refill  . ergocalciferol (VITAMIN D2) 50000 UNITS capsule Take 50,000 Units by mouth 2 (two) times a week. On Thurs & Sun    . HYDROcodone-acetaminophen (NORCO/VICODIN) 5-325 MG tablet Take 1 tablet by mouth every 6 (six) hours as needed for moderate pain. 10 tablet 0  . ibuprofen (ADVIL,MOTRIN) 800 MG tablet Take 1 tablet (800 mg total) by mouth every 8 (eight) hours as needed. 30 tablet 0  . levothyroxine (SYNTHROID, LEVOTHROID) 137 MCG tablet Take 150 mcg by mouth at bedtime.     Marland Kitchen losartan (COZAAR) 25 MG tablet Take 25 mg by mouth daily.    Marland Kitchen ALPRAZolam (XANAX) 0.5 MG tablet Take one tablet the night before procedure, then one tablet morning of procedure (Patient not taking: Reported on 12/16/2017) 2  tablet 0  . phentermine 37.5 MG capsule Take 1 capsule (37.5 mg total) by mouth every morning. (Patient not taking: Reported on 12/16/2017) 30 capsule 1   No current facility-administered medications for this visit.     OBJECTIVE: Middle-aged white woman in no acute distress  Vitals:   12/16/17 1552  BP: 136/80  Pulse: 81  Resp: 19  Temp: 98.7 F (37.1 C)  SpO2: 98%     Body mass index is 32.28 kg/m.   Wt Readings from Last 3 Encounters:  12/16/17 200 lb (90.7 kg)  12/09/17 200 lb 13.4 oz (91.1 kg)  11/25/17 188 lb 15 oz (85.7 kg)      ECOG FS:0 - Asymptomatic  Ocular: Sclerae unicteric, pupils round and equal Lymphatic: No cervical or supraclavicular adenopathy Lungs no  rales or rhonchi Heart regular rate and rhythm Abd soft, nontender, positive bowel sounds MSK no focal spinal tenderness, no joint edema Neuro: non-focal, well-oriented, appropriate affect Breasts: The right breast is unremarkable.  The left breast is status post lumpectomy and sentinel lymph node sampling.  The incisions are healing nicely.  There is minimal swelling and erythema associated with the axillary incision.  There is no evidence of infection.  Both axillae are benign.   LAB RESULTS:  CMP     Component Value Date/Time   NA 141 12/16/2017 1540   K 4.1 12/16/2017 1540   CL 106 12/16/2017 1540   CO2 29 12/16/2017 1540   GLUCOSE 86 12/16/2017 1540   BUN 6 12/16/2017 1540   CREATININE 0.78 12/16/2017 1540   CALCIUM 8.8 (L) 12/16/2017 1540   PROT 6.8 12/16/2017 1540   ALBUMIN 3.7 12/16/2017 1540   AST 12 (L) 12/16/2017 1540   ALT 16 12/16/2017 1540   ALKPHOS 87 12/16/2017 1540   BILITOT 0.4 12/16/2017 1540   GFRNONAA >60 12/16/2017 1540   GFRAA >60 12/16/2017 1540    No results found for: TOTALPROTELP, ALBUMINELP, A1GS, A2GS, BETS, BETA2SER, GAMS, MSPIKE, SPEI  No results found for: KPAFRELGTCHN, LAMBDASER, KAPLAMBRATIO  Lab Results  Component Value Date   WBC 8.5 12/16/2017   NEUTROABS 4.8 12/16/2017   HGB 12.7 12/16/2017   HCT 38.8 12/16/2017   MCV 89.7 12/16/2017   PLT 305 12/16/2017    '@LASTCHEMISTRY'$ @  No results found for: LABCA2  No components found for: [id number]  No results for input(s): INR in the last 168 hours.  No results found for: LABCA2  No results found for: ALP379  No results found for: KWI097  No results found for: DZH299  No results found for: CA2729  No components found for: HGQUANT  No results found for: CEA1 / No results found for: CEA1   No results found for: AFPTUMOR  No results found for: CHROMOGRNA  No results found for: PSA1  Appointment on 12/16/2017  Component Date Value Ref Range Status  . Sodium 12/16/2017  141  135 - 145 mmol/L Final  . Potassium 12/16/2017 4.1  3.5 - 5.1 mmol/L Final  . Chloride 12/16/2017 106  98 - 111 mmol/L Final  . CO2 12/16/2017 29  22 - 32 mmol/L Final  . Glucose, Bld 12/16/2017 86  70 - 99 mg/dL Final  . BUN 12/16/2017 6  6 - 20 mg/dL Final  . Creatinine 12/16/2017 0.78  0.44 - 1.00 mg/dL Final  . Calcium 12/16/2017 8.8* 8.9 - 10.3 mg/dL Final  . Total Protein 12/16/2017 6.8  6.5 - 8.1 g/dL Final  . Albumin 12/16/2017 3.7  3.5 -  5.0 g/dL Final  . AST 12/16/2017 12* 15 - 41 U/L Final  . ALT 12/16/2017 16  0 - 44 U/L Final  . Alkaline Phosphatase 12/16/2017 87  38 - 126 U/L Final  . Total Bilirubin 12/16/2017 0.4  0.3 - 1.2 mg/dL Final  . GFR, Est Non Af Am 12/16/2017 >60  >60 mL/min Final  . GFR, Est AFR Am 12/16/2017 >60  >60 mL/min Final   Comment: (NOTE) The eGFR has been calculated using the CKD EPI equation. This calculation has not been validated in all clinical situations. eGFR's persistently <60 mL/min signify possible Chronic Kidney Disease.   Georgiann Hahn gap 12/16/2017 6  5 - 15 Final   Performed at [facility], [place] [address], [place], [place] [address]  . WBC Count 12/16/2017 8.5  3.9 - 10.3 K/uL Final  . RBC 12/16/2017 4.32  3.70 - 5.45 MIL/uL Final  . Hemoglobin 12/16/2017 12.7  11.6 - 15.9 g/dL Final  . HCT 12/16/2017 38.8  34.8 - 46.6 % Final  . MCV 12/16/2017 89.7  79.5 - 101.0 fL Final  . MCH 12/16/2017 29.4  25.1 - 34.0 pg Final  . MCHC 12/16/2017 32.8  31.5 - 36.0 g/dL Final  . RDW 12/16/2017 13.8  11.2 - 14.5 % Final  . Platelet Count 12/16/2017 305  145 - 400 K/uL Final  . Neutrophils Relative % 12/16/2017 56  % Final  . Neutro Abs 12/16/2017 4.8  1.5 - 6.5 K/uL Final  . Lymphocytes Relative 12/16/2017 26  % Final  . Lymphs Abs 12/16/2017 2.2  0.9 - 3.3 K/uL Final  . Monocytes Relative 12/16/2017 7  % Final  . Monocytes Absolute 12/16/2017 0.6  0.1 - 0.9 K/uL Final  . Eosinophils Relative 12/16/2017 10  % Final    . Eosinophils Absolute 12/16/2017 0.8* 0.0 - 0.5 K/uL Final  . Basophils Relative 12/16/2017 1  % Final  . Basophils Absolute 12/16/2017 0.0  0.0 - 0.1 K/uL Final   Performed at [facility], [address] [place], [place] [address]    (this displays the last labs from the last 3 days)  No results found for: TOTALPROTELP, ALBUMINELP, A1GS, A2GS, BETS, BETA2SER, GAMS, MSPIKE, SPEI (this displays SPEP labs)  No results found for: KPAFRELGTCHN, LAMBDASER, KAPLAMBRATIO (kappa/lambda light chains)  No results found for: HGBA, HGBA2QUANT, HGBFQUANT, HGBSQUAN (Hemoglobinopathy evaluation)   No results found for: LDH  No results found for: IRON, TIBC, IRONPCTSAT (Iron and TIBC)  No results found for: FERRITIN  Urinalysis    Component Value Date/Time   COLORURINE YELLOW 10/02/2015 2022   APPEARANCEUR CLOUDY (A) 10/02/2015 2022   LABSPEC 1.020 10/02/2015 2022   PHURINE 6.5 10/02/2015 2022   GLUCOSEU NEGATIVE 10/02/2015 2022   HGBUR NEGATIVE 10/02/2015 2022   BILIRUBINUR NEGATIVE 10/02/2015 2022   KETONESUR 15 (A) 10/02/2015 2022   PROTEINUR NEGATIVE 10/02/2015 2022   UROBILINOGEN 0.2 09/20/2014 1536   NITRITE NEGATIVE 10/02/2015 2022   LEUKOCYTESUR SMALL (A) 10/02/2015 2022     STUDIES: Nm Sentinel Node Inj-no Rpt (breast)  Result Date: 12/09/2017 Sulfur colloid was injected by the nuclear medicine technologist for melanoma sentinel node.   Mm Breast Surgical Specimen  Result Date: 11/25/2017 CLINICAL DATA:  Post left breast excision for distortion. EXAM: SPECIMEN RADIOGRAPH OF THE LEFT BREAST COMPARISON:  Previous exam(s). FINDINGS: Status post excision of the left breast. The wire tip is present and marked for pathology. IMPRESSION: Specimen radiograph of the left breast.  Electronically Signed   By: Everlean Alstrom M.D.   On: 11/25/2017 11:33   Mm Lt Plc Breast Loc Dev   1st Lesion  Inc Mammo Guide  Result Date: 11/25/2017 CLINICAL DATA:   57 year old female presents for wire localization of suspicious mass/distortion in the upper-outer left breast. EXAM: NEEDLE LOCALIZATION OF THE LEFT BREAST WITH MAMMO GUIDANCE COMPARISON:  Previous exams. FINDINGS: Patient presents for needle localization prior to left breast excision. I met with the patient and we discussed the procedure of needle localization including benefits and alternatives. We discussed the high likelihood of a successful procedure. We discussed the risks of the procedure, including infection, bleeding, tissue injury, and further surgery. Informed, written consent was given. The usual time-out protocol was performed immediately prior to the procedure. Using mammographic guidance, sterile technique, 1% lidocaine and a 5 cm modified Kopans needle, the mass/distortion was localized using lateral to medial approach. The images were marked for Dr. Brantley Stage. IMPRESSION: Needle localization left breast. No apparent complications. Electronically Signed   By: Everlean Alstrom M.D.   On: 11/25/2017 08:44    ELIGIBLE FOR AVAILABLE RESEARCH PROTOCOL: No  ASSESSMENT: 57 y.o. Bethany [name], [name] woman status post left breast upper outer quadrant lumpectomy 11/25/2017 for a pT1b pNX, stage Ia invasive ductal carcinoma, grade 1, estrogen and progesterone receptor positive, HER-2/neu negative, with an MIB-1 of 3%  (1) status post left axillary lymph node sampling 12/09/2017, all 4 sentinel lymph nodes clear  (2) Oncotype results pending: Chemotherapy not anticipated  (3) adjuvant radiation to follow  (4) antiestrogens to follow at the completion of local treatment  PLAN: I spent approximately 60 minutes face to face with Bethany [name] with more than 50% of that time spent in counseling and coordination of care. Specifically we reviewed the biology of the patient's diagnosis and the specifics of her situation. We first reviewed the fact that cancer is not one disease but more than 100 different diseases  and that it is important to keep them separate-- otherwise when friends and relatives discuss their own cancer experiences with Bethany [name] confusion can result. Similarly we explained that if breast cancer spreads to the bone or liver, the patient would not have bone cancer or liver cancer, but breast cancer in the bone and breast cancer in the liver: one cancer in three places-- not 3 different cancers which otherwise would have to be treated in 3 different ways.  We discussed the difference between local and systemic therapy. In terms of loco-regional treatment, lumpectomy plus radiation is equivalent to mastectomy as far as survival is concerned.  It is important to understand this since she was wondering whether she would need to have both breasts removed at this point.  We certainly do not recommend that.  We then discussed the rationale for systemic therapy. There is some risk that this cancer may have already spread to other parts of her body. Patients frequently ask at this point about bone scans, CAT scans and PET scans to find out if they have occult breast cancer somewhere else. The problem is that in early stage disease we are much more likely to find false positives then true cancers and this would expose the patient to unnecessary procedures as well as unnecessary radiation. Scans cannot answer the question the patient really would like to know, which is whether she has microscopic disease elsewhere in her body. For those reasons we do not recommend them.  Of course we would proceed to aggressive evaluation of any symptoms that  might suggest metastatic disease, but that is not the case here.  Next we went over the options for systemic therapy which are anti-estrogens, anti-HER-2 immunotherapy, and chemotherapy. Bethany [name] does not meet criteria for anti-HER-2 immunotherapy. She is a good candidate for anti-estrogens.  The question of chemotherapy is more complicated. Chemotherapy is most effective in  rapidly growing, aggressive tumors. It is much less effective in low-grade, slow growing cancers, like Bethany [name] 's. For that reason we are going to request an Oncotype from the definitive surgical sample, as suggested by NCCN guidelines. That should confirm our expectation that she will not need chemotherapy to have a good prognosis and that she would get no benefit from chemotherapy  Bethany [name] was also wondering if she should have genetics testing.  We went over the criteria for this and she understands she does not meet criteria.  That means her chance of having a definable mutation is extremely low.  Accordingly she is not being referred to genetics  Overall then the plan is to wait on the Oncotype, proceed to radiation, and then she will return to see me mid December at which time we will choose an antiestrogen for her to take for 5 years  Bethany [name] has a good understanding of the overall plan. She agrees with it. She knows the goal of treatment in her case is cure. She will call with any problems that may develop before her next visit here.   Fumie Fiallo, Virgie Dad, MD  12/16/17 4:21 PM Medical Oncology and Hematology [facility] [address] [place], [place] [address] Tel. [phone]    Fax. [phone]  Alice Rieger, am acting as scribe for Chauncey Cruel MD.  I, Lurline Del MD, have reviewed the above documentation for accuracy and completeness, and I agree with the above.

## 2017-12-16 ENCOUNTER — Inpatient Hospital Stay: Payer: 59 | Attending: Oncology | Admitting: Oncology

## 2017-12-16 ENCOUNTER — Inpatient Hospital Stay: Payer: 59

## 2017-12-16 VITALS — BP 136/80 | HR 81 | Temp 98.7°F | Resp 19 | Ht 66.0 in | Wt 200.0 lb

## 2017-12-16 DIAGNOSIS — Z85850 Personal history of malignant neoplasm of thyroid: Secondary | ICD-10-CM | POA: Insufficient documentation

## 2017-12-16 DIAGNOSIS — L509 Urticaria, unspecified: Secondary | ICD-10-CM | POA: Diagnosis not present

## 2017-12-16 DIAGNOSIS — N6489 Other specified disorders of breast: Secondary | ICD-10-CM | POA: Insufficient documentation

## 2017-12-16 DIAGNOSIS — Z79899 Other long term (current) drug therapy: Secondary | ICD-10-CM | POA: Diagnosis not present

## 2017-12-16 DIAGNOSIS — Z801 Family history of malignant neoplasm of trachea, bronchus and lung: Secondary | ICD-10-CM | POA: Diagnosis not present

## 2017-12-16 DIAGNOSIS — F419 Anxiety disorder, unspecified: Secondary | ICD-10-CM | POA: Insufficient documentation

## 2017-12-16 DIAGNOSIS — C50012 Malignant neoplasm of nipple and areola, left female breast: Secondary | ICD-10-CM

## 2017-12-16 DIAGNOSIS — K219 Gastro-esophageal reflux disease without esophagitis: Secondary | ICD-10-CM | POA: Diagnosis not present

## 2017-12-16 DIAGNOSIS — E559 Vitamin D deficiency, unspecified: Secondary | ICD-10-CM | POA: Insufficient documentation

## 2017-12-16 DIAGNOSIS — C50412 Malignant neoplasm of upper-outer quadrant of left female breast: Secondary | ICD-10-CM | POA: Diagnosis not present

## 2017-12-16 DIAGNOSIS — I341 Nonrheumatic mitral (valve) prolapse: Secondary | ICD-10-CM | POA: Insufficient documentation

## 2017-12-16 DIAGNOSIS — I10 Essential (primary) hypertension: Secondary | ICD-10-CM | POA: Diagnosis not present

## 2017-12-16 DIAGNOSIS — Z170 Estrogen receptor positive status [ER+]: Secondary | ICD-10-CM | POA: Insufficient documentation

## 2017-12-16 DIAGNOSIS — K449 Diaphragmatic hernia without obstruction or gangrene: Secondary | ICD-10-CM | POA: Insufficient documentation

## 2017-12-16 DIAGNOSIS — Z87891 Personal history of nicotine dependence: Secondary | ICD-10-CM | POA: Insufficient documentation

## 2017-12-16 DIAGNOSIS — E039 Hypothyroidism, unspecified: Secondary | ICD-10-CM | POA: Insufficient documentation

## 2017-12-16 DIAGNOSIS — Z800 Family history of malignant neoplasm of digestive organs: Secondary | ICD-10-CM | POA: Diagnosis not present

## 2017-12-16 LAB — CBC WITH DIFFERENTIAL (CANCER CENTER ONLY)
Basophils Absolute: 0 10*3/uL (ref 0.0–0.1)
Basophils Relative: 1 %
EOS ABS: 0.8 10*3/uL — AB (ref 0.0–0.5)
Eosinophils Relative: 10 %
HEMATOCRIT: 38.8 % (ref 34.8–46.6)
HEMOGLOBIN: 12.7 g/dL (ref 11.6–15.9)
LYMPHS ABS: 2.2 10*3/uL (ref 0.9–3.3)
Lymphocytes Relative: 26 %
MCH: 29.4 pg (ref 25.1–34.0)
MCHC: 32.8 g/dL (ref 31.5–36.0)
MCV: 89.7 fL (ref 79.5–101.0)
MONOS PCT: 7 %
Monocytes Absolute: 0.6 10*3/uL (ref 0.1–0.9)
NEUTROS PCT: 56 %
Neutro Abs: 4.8 10*3/uL (ref 1.5–6.5)
Platelet Count: 305 10*3/uL (ref 145–400)
RBC: 4.32 MIL/uL (ref 3.70–5.45)
RDW: 13.8 % (ref 11.2–14.5)
WBC Count: 8.5 10*3/uL (ref 3.9–10.3)

## 2017-12-16 LAB — CMP (CANCER CENTER ONLY)
ALT: 16 U/L (ref 0–44)
AST: 12 U/L — AB (ref 15–41)
Albumin: 3.7 g/dL (ref 3.5–5.0)
Alkaline Phosphatase: 87 U/L (ref 38–126)
Anion gap: 6 (ref 5–15)
BUN: 6 mg/dL (ref 6–20)
CHLORIDE: 106 mmol/L (ref 98–111)
CO2: 29 mmol/L (ref 22–32)
CREATININE: 0.78 mg/dL (ref 0.44–1.00)
Calcium: 8.8 mg/dL — ABNORMAL LOW (ref 8.9–10.3)
GFR, Estimated: >60 mL/min (ref >60)
Glucose, Bld: 86 mg/dL (ref 70–99)
Potassium: 4.1 mmol/L (ref 3.5–5.1)
Sodium: 141 mmol/L (ref 135–145)
Total Bilirubin: 0.4 mg/dL (ref 0.3–1.2)
Total Protein: 6.8 g/dL (ref 6.5–8.1)

## 2017-12-17 ENCOUNTER — Encounter: Payer: Self-pay | Admitting: *Deleted

## 2017-12-17 ENCOUNTER — Ambulatory Visit: Payer: 59 | Admitting: Obstetrics & Gynecology

## 2017-12-17 ENCOUNTER — Telehealth: Payer: Self-pay | Admitting: Oncology

## 2017-12-17 ENCOUNTER — Telehealth: Payer: Self-pay | Admitting: *Deleted

## 2017-12-17 NOTE — Telephone Encounter (Signed)
Scheduled appt per 10/3 sch message - sent reminder letter in the mail with appt date and time

## 2017-12-17 NOTE — Telephone Encounter (Signed)
Ordered oncotype per Dr. Magrinat.  Faxed requisition to pathology and confirmed receipt.  

## 2017-12-22 NOTE — Progress Notes (Signed)
Location of Breast Cancer:Malignant neoplasm of the upper outer quadrant of left breast, ER/PR(+)  Did patient present with symptoms (if so, please note symptoms) or was this found on screening mammography?: Routine mammogram  Diagnostic mammogram 10/18/2017: Breast density category B.  There was a hypoechoic lesion consistent of a mass at the 2:30 o'clock upper outer quadrant middle depth and measuring 0.5 x 0.3 x 0.4 cm.  Sonographic evaluation of the left axilla shows no enlarged or abnormal lymph nodes.  Screening mammogram 10/26/2017: possible abnormality in the left breast.  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 Margins were negative for carcinoma.  Lymph Nodes 12/09/2017    Past/Anticipated interventions by surgeon, if any: Dr. Brantley Stage 11/25/2017 -She underwent left lumpectomy of the lesion on 11/25/2017  -Then in a separate procedure she underwent left sentinel lymph node sampling on 12/09/2017 showing: Four left axillary sentinel lymph were negative for carcinoma. (0/4). -12/24/2017  Past/Anticipated interventions by medical oncology, if any: Chemotherapy  Dr. Jana Hakim 12/16/2017 -The patient was evaluated in the breast cancer clinic on 12/16/2017.  -Her case was also presented at the multidisciplinary breast cancer conference on 12/15/2017. - At that time a preliminary plan was proposed: Oncotype testing, adjuvant radiation, antiestrogens -Overall then the plan is to wait on the Oncotype, proceed to radiation, and then she will return to see me mid December at which time we will choose an antiestrogen for her to take for 5 years.   Lymphedema issues, if any:  No  Pain issues, if any:  5/10 tenderness in the underarm from lymph node removal  BP (!) 144/87 (BP Location: Right Arm,  Patient Position: Sitting)   Pulse 81   Temp 98.1 F (36.7 C) (Oral)   Resp 18   Ht '5\' 6"'$  (1.676 m)   Wt 199 lb 3.2 oz (90.4 kg)   SpO2 95%   BMI 32.15 kg/m    Wt Readings from Last 3 Encounters:  12/23/17 199 lb 3.2 oz (90.4 kg)  12/16/17 200 lb (90.7 kg)  12/09/17 200 lb 13.4 oz (91.1 kg)   SAFETY ISSUES:  Prior radiation? No  Pacemaker/ICD? No   Possible current pregnancy? No  Is the patient on methotrexate? No  Current Complaints / other details:   Seen in breast clinic 12/16/2017    Cori Razor, RN 12/22/2017,3:43 PM

## 2017-12-23 ENCOUNTER — Other Ambulatory Visit: Payer: Self-pay

## 2017-12-23 ENCOUNTER — Encounter: Payer: Self-pay | Admitting: Radiation Oncology

## 2017-12-23 ENCOUNTER — Ambulatory Visit
Admission: RE | Admit: 2017-12-23 | Discharge: 2017-12-23 | Disposition: A | Discharge status: Home / Self Care | Payer: 59 | Source: Ambulatory Visit | Attending: Radiation Oncology | Admitting: Radiation Oncology

## 2017-12-23 VITALS — BP 144/87 | HR 81 | Temp 98.1°F | Resp 18 | Ht 66.0 in | Wt 199.2 lb

## 2017-12-23 DIAGNOSIS — Z170 Estrogen receptor positive status [ER+]: Principal | ICD-10-CM

## 2017-12-23 DIAGNOSIS — Z885 Allergy status to narcotic agent status: Secondary | ICD-10-CM | POA: Insufficient documentation

## 2017-12-23 DIAGNOSIS — E039 Hypothyroidism, unspecified: Secondary | ICD-10-CM | POA: Diagnosis not present

## 2017-12-23 DIAGNOSIS — Z87891 Personal history of nicotine dependence: Secondary | ICD-10-CM | POA: Diagnosis not present

## 2017-12-23 DIAGNOSIS — C50412 Malignant neoplasm of upper-outer quadrant of left female breast: Secondary | ICD-10-CM | POA: Insufficient documentation

## 2017-12-23 DIAGNOSIS — L7633 Postprocedural seroma of skin and subcutaneous tissue following a dermatologic procedure: Secondary | ICD-10-CM | POA: Diagnosis not present

## 2017-12-23 DIAGNOSIS — Z79899 Other long term (current) drug therapy: Secondary | ICD-10-CM | POA: Insufficient documentation

## 2017-12-24 NOTE — Progress Notes (Signed)
Radiation Oncology         [phone] ________________________________  Name: Bethany [name]        MRN: [id number]  Date of Service: 12/23/2017 DOB: June 30, 1960  [id number], [name], MD  Erroll Luna, MD     REFERRING PHYSICIAN: Erroll Luna, MD   DIAGNOSIS: The encounter diagnosis was Malignant neoplasm of upper-outer quadrant of left breast in female, estrogen receptor positive ([place]).   HISTORY OF PRESENT ILLNESS: Bethany [name] is a 57 y.o. female seen at the request of Dr. Brantley Stage for a new diagnosis of left breast cancer.  The patient was found to have a screening detected abnormality in the left breast, and diagnostic imaging revealed this to be approximately 5 mm in the upper outer quadrant of the breast, and no evidence of adenopathy was noted by ultrasound.  Unfortunately, she was unable to undergo biopsy due to syncope x4 episodes.  She ultimately underwent lumpectomy on 11/25/2017 revealing a 1 cm grade 1 invasive ductal carcinoma with negative margins.  She was found to have an ER PR positive tumor HER-2/neu was negative, Ki-67 was 3%, and she subsequently underwent left axillary lymph node biopsy on 12/09/2017.  4 nodes were sampled and none contained evidence of metastatic disease.  She has been having issues with seroma at the site of her axillary surgery, and Oncotype is pending.  She sees Dr. Brantley Stage tomorrow.  PREVIOUS RADIATION THERAPY: No   PAST MEDICAL HISTORY:  Past Medical History:  Diagnosis Date  . Anxiety   . Breast mass, left 11/2017  . Dental crowns present   . History of hiatal hernia    no current med.  . Hypertension    states under control with med., has been on med. x 1 yr.  . Hypothyroidism   . MVP (mitral valve prolapse)   . PONV (postoperative nausea and vomiting)   . Vitamin D deficiency        PAST SURGICAL HISTORY: Past Surgical History:  Procedure Laterality Date  . ABDOMINAL HYSTERECTOMY     partial  . APPENDECTOMY      . AXILLARY SENTINEL NODE BIOPSY Left 12/09/2017   Procedure: AXILLARY SENTINEL NODE BIOPSY;  Surgeon: Erroll Luna, MD;  Location: [place];  Service: General;  Laterality: Left;  . BREAST LUMPECTOMY WITH NEEDLE LOCALIZATION Left 11/25/2017   Procedure: LEFT BREAST LUMPECTOMY WITH NEEDLE LOCALIZATION;  Surgeon: Erroll Luna, MD;  Location: [place];  Service: General;  Laterality: Left;  . COLONOSCOPY WITH PROPOFOL  10/03/2015  . LAPAROSCOPIC APPENDECTOMY N/A 10/03/2015   Procedure: APPENDECTOMY LAPAROSCOPIC;  Surgeon: Rolm Bookbinder, MD;  Location: [place];  Service: General;  Laterality: N/A;  . TOTAL THYROIDECTOMY  04/02/2003     FAMILY HISTORY:  Family History  Problem Relation Age of Onset  . Hypertension Father   . Diabetes Father   . Heart disease Father   . Hypertension Brother   . Heart disease Paternal Grandfather   . Colon cancer Other        great maternal aunt   . Colon polyps Mother      SOCIAL HISTORY:  reports that she quit smoking about 3 years ago. She has never used smokeless tobacco. She reports that she drinks alcohol. She reports that she does not use drugs.   ALLERGIES: Oxycodone and Latex   MEDICATIONS:  Current Outpatient Medications  Medication Sig Dispense Refill  . ergocalciferol (VITAMIN D2) 50000 UNITS capsule Take 50,000 Units by mouth 2 (two)  times a week. On Thurs & Sun    . ibuprofen (ADVIL,MOTRIN) 800 MG tablet Take 1 tablet (800 mg total) by mouth every 8 (eight) hours as needed. 30 tablet 0  . levothyroxine (SYNTHROID, LEVOTHROID) 137 MCG tablet Take 150 mcg by mouth at bedtime.     Marland Kitchen losartan (COZAAR) 25 MG tablet Take 25 mg by mouth daily.    . phentermine 37.5 MG capsule Take 1 capsule (37.5 mg total) by mouth every morning. 30 capsule 1  . HYDROcodone-acetaminophen (NORCO/VICODIN) 5-325 MG tablet Take 1 tablet by mouth every 6 (six) hours as needed for moderate pain. (Patient not taking: Reported  on 12/23/2017) 10 tablet 0   No current facility-administered medications for this encounter.      REVIEW OF SYSTEMS: On review of systems, the patient reports that she is doing well overall. She denies any chest pain, shortness of breath, cough, fevers, chills, night sweats, unintended weight changes. She denies any bowel or bladder disturbances, and denies abdominal pain, nausea or vomiting. She denies any new musculoskeletal or joint aches or pains. A complete review of systems is obtained and is otherwise negative.     PHYSICAL EXAM:  Wt Readings from Last 3 Encounters:  12/23/17 199 lb 3.2 oz (90.4 kg)  12/16/17 200 lb (90.7 kg)  12/09/17 200 lb 13.4 oz (91.1 kg)   Temp Readings from Last 3 Encounters:  12/23/17 98.1 F (36.7 C) (Oral)  12/16/17 98.7 F (37.1 C) (Oral)  12/09/17 97.7 F (36.5 C)   BP Readings from Last 3 Encounters:  12/23/17 (!) 144/87  12/16/17 136/80  12/09/17 (!) 146/86   Pulse Readings from Last 3 Encounters:  12/23/17 81  12/16/17 81  12/09/17 74     In general this is a well appearing Caucasian female in no acute distress. She is alert and oriented x4 and appropriate throughout the examination. HEENT reveals that the patient is normocephalic, atraumatic. EOMs are intact. Cardiopulmonary assessment is negative for acute distress and she exhibits normal effort.  The left breast reveals 2 well-healing incision sites, her left upper axillary site is full and appears to be consistent with seroma however there is some mild erythema.  This was outlined with a ballpoint pen so that she can show this to Dr. Brantley Stage tomorrow, she does continue on clindamycin currently.  The patient however states that the erythema is more pronounced in the last few days.  ECOG = 1  0 - Asymptomatic (Fully active, able to carry on all predisease activities without restriction)  1 - Symptomatic but completely ambulatory (Restricted in physically strenuous activity but  ambulatory and able to carry out work of a light or sedentary nature. For example, light housework, office work)  2 - Symptomatic, <50% in bed during the day (Ambulatory and capable of all self care but unable to carry out any work activities. Up and about more than 50% of waking hours)  3 - Symptomatic, >50% in bed, but not bedbound (Capable of only limited self-care, confined to bed or chair 50% or more of waking hours)  4 - Bedbound (Completely disabled. Cannot carry on any self-care. Totally confined to bed or chair)  5 - Death   Eustace Pen MM, Creech RH, Tormey DC, et al. [phone]). "Toxicity and response criteria of the [facility]". [place] Oncol. 5 (6): 649-55    LABORATORY DATA:  Lab Results  Component Value Date   WBC 8.5 12/16/2017   HGB 12.7 12/16/2017  HCT 38.8 12/16/2017   MCV 89.7 12/16/2017   PLT 305 12/16/2017   Lab Results  Component Value Date   NA 141 12/16/2017   K 4.1 12/16/2017   CL 106 12/16/2017   CO2 29 12/16/2017   Lab Results  Component Value Date   ALT 16 12/16/2017   AST 12 (L) 12/16/2017   ALKPHOS 87 12/16/2017   BILITOT 0.4 12/16/2017      RADIOGRAPHY: Nm Sentinel Node Inj-no Rpt (breast)  Result Date: 12/09/2017 Sulfur colloid was injected by the nuclear medicine technologist for melanoma sentinel node.   Mm Breast Surgical Specimen  Result Date: 11/25/2017 CLINICAL DATA:  Post left breast excision for distortion. EXAM: SPECIMEN RADIOGRAPH OF THE LEFT BREAST COMPARISON:  Previous exam(s). FINDINGS: Status post excision of the left breast. The wire tip is present and marked for pathology. IMPRESSION: Specimen radiograph of the left breast. Electronically Signed   By: Everlean Alstrom M.D.   On: 11/25/2017 11:33   Mm Lt Plc Breast Loc Dev   1st Lesion  Inc Mammo Guide  Result Date: 11/25/2017 CLINICAL DATA:  57 year old female presents for wire localization of suspicious mass/distortion in the upper-outer left breast.  EXAM: NEEDLE LOCALIZATION OF THE LEFT BREAST WITH MAMMO GUIDANCE COMPARISON:  Previous exams. FINDINGS: Patient presents for needle localization prior to left breast excision. I met with the patient and we discussed the procedure of needle localization including benefits and alternatives. We discussed the high likelihood of a successful procedure. We discussed the risks of the procedure, including infection, bleeding, tissue injury, and further surgery. Informed, written consent was given. The usual time-out protocol was performed immediately prior to the procedure. Using mammographic guidance, sterile technique, 1% lidocaine and a 5 cm modified Kopans needle, the mass/distortion was localized using lateral to medial approach. The images were marked for Dr. Brantley Stage. IMPRESSION: Needle localization left breast. No apparent complications. Electronically Signed   By: Everlean Alstrom M.D.   On: 11/25/2017 08:44       IMPRESSION/PLAN: 1. Stage IA, pT1xN0M0 ER/PR positive invasive ductal carcinoma of the left breast. Dr. Lisbeth Renshaw discusses the pathology findings and reviews the nature of invasive left breast disease.  We will await the results of her Oncotype Oncotype testing, and if negative, she would be a candidate to then move forward with external radiotherapy to the breast followed by antiestrogen therapy. We discussed the risks, benefits, short, and long term effects of radiotherapy, and the patient is interested in proceeding. Dr. Lisbeth Renshaw discusses the delivery and logistics of radiotherapy and anticipates a course of 6 1/2 weeks of radiotherapy. We we will hold off on scheduling simulation until we had a better idea of resolution of her seroma.  She is in agreement with this plan.  I will contact her next week to see where things are at.  The above documentation reflects my direct findings during this shared patient visit. Please see the separate note by Dr. Lisbeth Renshaw on this date for the remainder of the  patient's plan of care.    Carola Rhine, PAC

## 2017-12-27 ENCOUNTER — Encounter (HOSPITAL_COMMUNITY): Payer: Self-pay | Admitting: Oncology

## 2017-12-28 ENCOUNTER — Telehealth: Payer: Self-pay | Admitting: *Deleted

## 2017-12-28 ENCOUNTER — Encounter: Payer: Self-pay | Admitting: *Deleted

## 2017-12-28 NOTE — Telephone Encounter (Signed)
Received oncotype score of 24/10%. Physician team notified. Called pt with results and discussed she does not need chemotherapy. Informed pt her next step will be xrt with Dr. Lisbeth Renshaw. Received verbal understanding.

## 2017-12-28 NOTE — Progress Notes (Signed)
[place] Psychosocial Distress Screening Clinical Social Work  Clinical Social Work was referred by distress screening protocol.  The patient scored a 7 on the Psychosocial Distress Thermometer which indicates moderate distress. Clinical Social Worker contacted patient by phone to assess for distress and other psychosocial needs.  Patient express some anxiety concerning her surgical would having difficulty healing and waiting for rest results to determine treatment plan.  CSw validated patients concerns and offered additional support.  CSW provided education on the [facility] support team and support programs at [facility].  CSW mailed patient a packet with support program calendar and information.  CSW encouraged patient to call with questions or concerns.    ONCBCN DISTRESS SCREENING 12/23/2017  Screening Type Initial Screening  Distress experienced in past week (1-10) 7  Emotional problem type Nervousness/Anxiety;Adjusting to illness  Physical Problem type Sleep/insomnia  Other [phone]    Johnnye Lana, MSW, LCSW, OSW-C Clinical Social Worker [facility] [phone]

## 2017-12-30 DIAGNOSIS — D2262 Melanocytic nevi of left upper limb, including shoulder: Secondary | ICD-10-CM | POA: Diagnosis not present

## 2017-12-30 DIAGNOSIS — L570 Actinic keratosis: Secondary | ICD-10-CM | POA: Diagnosis not present

## 2017-12-30 DIAGNOSIS — D2271 Melanocytic nevi of right lower limb, including hip: Secondary | ICD-10-CM | POA: Diagnosis not present

## 2017-12-30 DIAGNOSIS — Z85828 Personal history of other malignant neoplasm of skin: Secondary | ICD-10-CM | POA: Diagnosis not present

## 2018-01-06 ENCOUNTER — Telehealth: Payer: Self-pay | Admitting: Medical

## 2018-01-06 ENCOUNTER — Telehealth: Payer: Self-pay | Admitting: *Deleted

## 2018-01-06 ENCOUNTER — Other Ambulatory Visit: Payer: Self-pay | Admitting: Emergency Medicine

## 2018-01-06 ENCOUNTER — Inpatient Hospital Stay (HOSPITAL_BASED_OUTPATIENT_CLINIC_OR_DEPARTMENT_OTHER): Payer: 59 | Admitting: Medical

## 2018-01-06 ENCOUNTER — Telehealth: Payer: Self-pay | Admitting: Radiation Oncology

## 2018-01-06 ENCOUNTER — Other Ambulatory Visit: Payer: Self-pay | Admitting: Pharmacist

## 2018-01-06 VITALS — BP 148/91 | HR 88 | Temp 98.0°F | Resp 18 | Ht 66.0 in | Wt 198.9 lb

## 2018-01-06 DIAGNOSIS — K449 Diaphragmatic hernia without obstruction or gangrene: Secondary | ICD-10-CM

## 2018-01-06 DIAGNOSIS — C50412 Malignant neoplasm of upper-outer quadrant of left female breast: Secondary | ICD-10-CM | POA: Diagnosis not present

## 2018-01-06 DIAGNOSIS — F419 Anxiety disorder, unspecified: Secondary | ICD-10-CM

## 2018-01-06 DIAGNOSIS — Z170 Estrogen receptor positive status [ER+]: Secondary | ICD-10-CM | POA: Diagnosis not present

## 2018-01-06 DIAGNOSIS — E559 Vitamin D deficiency, unspecified: Secondary | ICD-10-CM

## 2018-01-06 DIAGNOSIS — Z79899 Other long term (current) drug therapy: Secondary | ICD-10-CM

## 2018-01-06 DIAGNOSIS — L509 Urticaria, unspecified: Secondary | ICD-10-CM | POA: Diagnosis not present

## 2018-01-06 DIAGNOSIS — N6489 Other specified disorders of breast: Secondary | ICD-10-CM | POA: Diagnosis not present

## 2018-01-06 DIAGNOSIS — I341 Nonrheumatic mitral (valve) prolapse: Secondary | ICD-10-CM

## 2018-01-06 DIAGNOSIS — K219 Gastro-esophageal reflux disease without esophagitis: Secondary | ICD-10-CM

## 2018-01-06 DIAGNOSIS — Z85850 Personal history of malignant neoplasm of thyroid: Secondary | ICD-10-CM

## 2018-01-06 DIAGNOSIS — E039 Hypothyroidism, unspecified: Secondary | ICD-10-CM

## 2018-01-06 DIAGNOSIS — I10 Essential (primary) hypertension: Secondary | ICD-10-CM

## 2018-01-06 DIAGNOSIS — Z800 Family history of malignant neoplasm of digestive organs: Secondary | ICD-10-CM

## 2018-01-06 DIAGNOSIS — Z801 Family history of malignant neoplasm of trachea, bronchus and lung: Secondary | ICD-10-CM

## 2018-01-06 DIAGNOSIS — Z87891 Personal history of nicotine dependence: Secondary | ICD-10-CM

## 2018-01-06 MED ORDER — DEXAMETHASONE SODIUM PHOSPHATE 10 MG/ML IJ SOLN
INTRAMUSCULAR | Status: AC
  Filled 2018-01-06: qty 2

## 2018-01-06 MED ORDER — CETIRIZINE HCL 10 MG PO TABS: 10.0000 mg | ORAL_TABLET | Freq: Every day | ORAL | 1 refills | Status: DC

## 2018-01-06 MED ORDER — SODIUM CHLORIDE 0.9 % IV SOLN: 20.0000 mg | Freq: Once | INTRAVENOUS | Status: DC

## 2018-01-06 MED ORDER — PREDNISONE 5 MG PO TABS: ORAL_TABLET | ORAL | 0 refills | Status: DC

## 2018-01-06 MED ORDER — FEXOFENADINE HCL 60 MG PO TABS: 60.0000 mg | ORAL_TABLET | Freq: Two times a day (BID) | ORAL | 1 refills | Status: DC

## 2018-01-06 MED ORDER — DEXAMETHASONE SODIUM PHOSPHATE 10 MG/ML IJ SOLN
20.0000 mg | Freq: Once | INTRAMUSCULAR | Status: AC
  Administered 2018-01-06: 20 mg via INTRAMUSCULAR
  Filled 2018-01-06: qty 2

## 2018-01-06 NOTE — Telephone Encounter (Signed)
This RN returned VM to pt per her call stating concern for onset of hives.  [name] states she had her breast surgery under Dr Brantley Stage- and was due to start radiation therapy this week but due to issues with wound healing and need for drainage x 4 it has been delayed until early November.  She states she has started breaking out in hives since above - was seen by Dr Brantley Stage yesterday for follow up and he prescribed a steroid dose pak.  [name] states last night she took benadryl as well and then went to bed. She awoke at 3 AM " feeling like I might have hives in my throat - kinda itchy and feeling funny - I tried to drink some cold water but then I got kinda choked on it - so I sucked on a popsicle and that seemed to help-maybe because it was cold."  [name] states above feeling comes and goes. She is continuing on the steroids.  Per discussion pt will come in to symptom management today for evaluation.

## 2018-01-06 NOTE — Telephone Encounter (Signed)
Pt sched per 10/24 sch message. Pt aware of d&t.

## 2018-01-06 NOTE — Patient Instructions (Signed)

## 2018-01-06 NOTE — Telephone Encounter (Signed)
I called and LM for the patient to call me back to see how her seroma is doing and try to coordinate treatment planning.

## 2018-01-06 NOTE — Progress Notes (Signed)
Pt presents with rash starting several days ago.  States that "this happens every time I go into surgery".  Reports itching, splotchy rash, redness visible on arms and throat where pt has been scratching.  Afebrile.  Denies CP or SOB or trouble swallowing.

## 2018-01-10 ENCOUNTER — Ambulatory Visit (INDEPENDENT_AMBULATORY_CARE_PROVIDER_SITE_OTHER): Payer: 59 | Admitting: Obstetrics & Gynecology

## 2018-01-10 ENCOUNTER — Encounter: Payer: Self-pay | Admitting: Obstetrics & Gynecology

## 2018-01-10 VITALS — BP 150/88

## 2018-01-10 DIAGNOSIS — Z170 Estrogen receptor positive status [ER+]: Secondary | ICD-10-CM | POA: Diagnosis not present

## 2018-01-10 DIAGNOSIS — C50412 Malignant neoplasm of upper-outer quadrant of left female breast: Secondary | ICD-10-CM

## 2018-01-10 DIAGNOSIS — N951 Menopausal and female climacteric states: Secondary | ICD-10-CM

## 2018-01-10 DIAGNOSIS — Z23 Encounter for immunization: Secondary | ICD-10-CM | POA: Diagnosis not present

## 2018-01-10 MED ORDER — SERTRALINE HCL 100 MG PO TABS: 100.0000 mg | ORAL_TABLET | Freq: Every day | ORAL | 12 refills | Status: DC

## 2018-01-10 NOTE — Progress Notes (Signed)
    Bethany [name] 28-May-1960 [id number]        57 y.o.  G3P3L3 Married  RP: Hot flushes/night sweats  HPI: S/P Total Hysterectomy.  Left breast Invasive ductal Ca ER/PR positive 10/2017.  Radiation Therapy as soon as completely healed from Lumpectomy/LN sampling.  C/O Hot flushes/night sweats.  Going through difficult times with her recent Dx of Breast Cancer.  No major depression, but certainly occasional low mood and more anxiety.  No suicidal ideation.   OB History  Gravida Para Term Preterm AB Living  3 3 3     3   SAB TAB Ectopic Multiple Live Births               # Outcome Date GA Lbr Len/2nd Weight Sex Delivery Anes PTL Lv  3 Term           2 Term           1 Term             Past medical history,surgical history, problem list, medications, allergies, family history and social history were all reviewed and documented in the EPIC chart.   Directed ROS with pertinent positives and negatives documented in the history of present illness/assessment and plan.  Exam:  Vitals:   01/10/18 1607  BP: (!) 150/88   General appearance:  Normal  Deferred gyn exam   Assessment/Plan:  57 y.o. G3P3003   1. Menopausal syndrome Hot flashes and night sweats interfering with daily life and causing fatigue.  Also under a lot of stress with lower mood and increased anxiety associated with recent diagnosis of breast cancer.  Given that hormone replacement therapy is contraindicated, decision to start on sertraline 100 mg tablet daily.  Usage, risks and benefits reviewed thoroughly with patient.  Prescription sent to pharmacy.  2. Malignant neoplasm of upper-outer quadrant of left breast in female, estrogen receptor positive ([place]) Left breast lumpectomy/LN sampling.  Will proceed with Radiation Therapy soon.  Will be followed by Dr Bethany [name].  Other orders - sertraline (ZOLOFT) 100 MG tablet; Take 1 tablet (100 mg total) by mouth daily.  Counseling on above issues and coordination of  care more than 50% for 25 minutes.  Bethany Bruins MD, 4:17 PM 01/10/2018

## 2018-01-10 NOTE — Progress Notes (Signed)
Symptoms Management Clinic Progress Note   Bethany [name] [id number] [birth year] 57 y.o.  Bethany [name] is managed by Dr. Jana Hakim  Actively treated with chemotherapy/immunotherapy: no   Assessment: Plan:    Urticaria - Plan: fexofenadine (ALLEGRA ALLERGY) 60 MG tablet, cetirizine (ZYRTEC ALLERGY) 10 MG tablet, predniSONE (DELTASONE) 5 MG tablet  Malignant neoplasm of upper-outer quadrant of left breast in female, estrogen receptor positive ([place]) - Plan: DISCONTINUED: dexamethasone (DECADRON) 20 mg in sodium chloride 0.9 % 50 mL IVPB   Urticaria: The patient has had other episodes with urticaria after surgery.  She was told to begin Allegra 60 mg p.o. twice daily Zyrtec 10 mg once daily.  She is also been given a low-dose steroid taper which she will begin tomorrow.  She was also given Decadron 20 mg IM x1.  ER positive malignant neoplasm of the left breast: The patient continues to have a recurrent seroma for which she has had drainage on several occasions.  It is now reformed.  According to Dr. Virgie Dad note, chemotherapy is not anticipated.  The patient will have adjuvant radiation.  She is to be again antiestrogen therapy following completion of radiation.  She is scheduled to see Dr. Jana Hakim in follow-up on 03/01/2018.  Please see After Visit Summary for patient specific instructions.  Future Appointments  Date Time Provider [place]  01/10/2018  4:00 PM [name], MD [facility]  03/01/2018  2:00 PM [facility] 1 [facility] None  03/01/2018  2:30 PM Magrinat, Virgie Dad, MD [facility] None  05/16/2018  2:00 PM [name], MD [facility]    No orders of the defined types were placed in this encounter.      Subjective:   Patient ID:  Bethany [name] is a 57 y.o. (DOB 1960/04/13) female.  Chief Complaint:  Chief Complaint  Patient presents with  . Rash    HPI Bethany [name] is a 57 year old female with an ER positive left breast  cancer who is pending start of radiation therapy.  Following radiation therapy she will be placed on antiestrogen therapy.  There are no plans for chemotherapy.  She has recently undergone a lumpectomy.  She has had recurrent seroma in her left axilla and has had this area drained several times.  She presents to the office today with diffuse erythematous hives over her neck and anterior bilateral upper extremities.  She reports that she has had this happen before with prior surgeries.  She was given a prescription for prednisone 10 mg with instructions to take this twice daily by her surgeon. Despite dosing with prednisone last evening she is no better.  Medications: I have reviewed the patient's current medications.  Allergies:  Allergies  Allergen Reactions  . Oxycodone Nausea And Vomiting  . Latex Rash    Past Medical History:  Diagnosis Date  . Anxiety   . Breast mass, left 11/2017  . Dental crowns present   . History of hiatal hernia    no current med.  . Hypertension    states under control with med., has been on med. x 1 yr.  . Hypothyroidism   . MVP (mitral valve prolapse)   . PONV (postoperative nausea and vomiting)   . Vitamin D deficiency     Past Surgical History:  Procedure Laterality Date  . ABDOMINAL HYSTERECTOMY     partial  . APPENDECTOMY    . AXILLARY SENTINEL NODE BIOPSY Left 12/09/2017   Procedure: AXILLARY SENTINEL NODE BIOPSY;  Surgeon:  Erroll Luna, MD;  Location: [place];  Service: General;  Laterality: Left;  . BREAST LUMPECTOMY WITH NEEDLE LOCALIZATION Left 11/25/2017   Procedure: LEFT BREAST LUMPECTOMY WITH NEEDLE LOCALIZATION;  Surgeon: Erroll Luna, MD;  Location: [employer] [facility];  Service: General;  Laterality: Left;  . COLONOSCOPY WITH PROPOFOL  10/03/2015  . LAPAROSCOPIC APPENDECTOMY N/A 10/03/2015   Procedure: APPENDECTOMY LAPAROSCOPIC;  Surgeon: Rolm Bookbinder, MD;  Location: [place];  Service: General;   Laterality: N/A;  . TOTAL THYROIDECTOMY  04/02/2003    Family History  Problem Relation Age of Onset  . Hypertension Father   . Diabetes Father   . Heart disease Father   . Hypertension Brother   . Heart disease Paternal Grandfather   . Colon cancer Other        great maternal aunt   . Colon polyps Mother     Social History   Socioeconomic History  . Marital status: Married    Spouse name: Not on file  . Number of children: Not on file  . Years of education: Not on file  . Highest education level: Not on file  Occupational History  . Not on file  Social Needs  . Financial resource strain: Not on file  . Food insecurity:    Worry: Not on file    Inability: Not on file  . Transportation needs:    Medical: No    Non-medical: No  Tobacco Use  . Smoking status: Former Smoker    Last attempt to quit: 08/14/2014    Years since quitting: 3.4  . Smokeless tobacco: Never Used  Substance and Sexual Activity  . Alcohol use: Yes    Comment: rarely  . Drug use: No  . Sexual activity: Yes    Partners: Male  Lifestyle  . Physical activity:    Days per week: Not on file    Minutes per session: Not on file  . Stress: Not on file  Relationships  . Social connections:    Talks on phone: Not on file    Gets together: Not on file    Attends religious service: Not on file    Active member of club or organization: Not on file    Attends meetings of clubs or organizations: Not on file    Relationship status: Not on file  . Intimate partner violence:    Fear of current or ex partner: No    Emotionally abused: No    Physically abused: No    Forced sexual activity: No  Other Topics Concern  . Not on file  Social History Narrative  . Not on file    Past Medical History, Surgical history, Social history, and Family history were reviewed and updated as appropriate.   Please see review of systems for further details on the patient's review from today.   Review of Systems:    Review of Systems  Constitutional: Negative for chills, diaphoresis and fever.  HENT: Negative for facial swelling and trouble swallowing.   Respiratory: Negative for cough, chest tightness and shortness of breath.   Cardiovascular: Negative for chest pain.  Skin: Positive for rash.       Pruritic macular rash over the upper extremities, neck and chest.    Objective:   Physical Exam:  BP (!) 148/91 (BP Location: Right Arm, Patient Position: Sitting) Comment: Notified Nurse of Bp  Pulse 88   Temp 98 F (36.7 C) (Oral)   Resp 18   Ht 5'  6" (1.676 m)   Wt 198 lb 14.4 oz (90.2 kg)   SpO2 98%   BMI 32.10 kg/m  ECOG: 0  Physical Exam  Constitutional: No distress.  HENT:  Head: Normocephalic and atraumatic.  Cardiovascular: Normal rate, regular rhythm and normal heart sounds. Exam reveals no gallop and no friction rub.  No murmur heard. Pulmonary/Chest: Effort normal and breath sounds normal. No respiratory distress. She has no wheezes. She has no rales.  Neurological: She is alert.  Skin: Skin is warm and dry. Rash noted. She is not diaphoretic. There is erythema.  Erythematous urticarial rash over the anterior forearms and anterior chest.  Psychiatric: She has a normal mood and affect. Her behavior is normal. Judgment and thought content normal.    Lab Review:     Component Value Date/Time   NA 141 12/16/2017 1540   K 4.1 12/16/2017 1540   CL 106 12/16/2017 1540   CO2 29 12/16/2017 1540   GLUCOSE 86 12/16/2017 1540   BUN 6 12/16/2017 1540   CREATININE 0.78 12/16/2017 1540   CALCIUM 8.8 (L) 12/16/2017 1540   PROT 6.8 12/16/2017 1540   ALBUMIN 3.7 12/16/2017 1540   AST 12 (L) 12/16/2017 1540   ALT 16 12/16/2017 1540   ALKPHOS 87 12/16/2017 1540   BILITOT 0.4 12/16/2017 1540   GFRNONAA >60 12/16/2017 1540   GFRAA >60 12/16/2017 1540       Component Value Date/Time   WBC 8.5 12/16/2017 1540   WBC 7.8 10/02/2015 2022   RBC 4.32 12/16/2017 1540   HGB 12.7  12/16/2017 1540   HCT 38.8 12/16/2017 1540   PLT 305 12/16/2017 1540   MCV 89.7 12/16/2017 1540   MCH 29.4 12/16/2017 1540   MCHC 32.8 12/16/2017 1540   RDW 13.8 12/16/2017 1540   LYMPHSABS 2.2 12/16/2017 1540   MONOABS 0.6 12/16/2017 1540   EOSABS 0.8 (H) 12/16/2017 1540   BASOSABS 0.0 12/16/2017 1540   -------------------------------  Imaging from last 24 hours (if applicable):  Radiology interpretation: No results found.

## 2018-01-11 ENCOUNTER — Encounter: Payer: Self-pay | Admitting: Obstetrics & Gynecology

## 2018-01-11 NOTE — Patient Instructions (Signed)
1. Menopausal syndrome Hot flashes and night sweats interfering with daily life and causing fatigue.  Also under a lot of stress with lower mood and increased anxiety associated with recent diagnosis of breast cancer.  Given that hormone replacement therapy is contraindicated, decision to start on sertraline 100 mg tablet daily.  Usage, risks and benefits reviewed thoroughly with patient.  Prescription sent to pharmacy.  2. Malignant neoplasm of upper-outer quadrant of left breast in female, estrogen receptor positive ([place]) Left breast lumpectomy/LN sampling.  Will proceed with Radiation Therapy soon.  Will be followed by Dr Jana Hakim.  Other orders - sertraline (ZOLOFT) 100 MG tablet; Take 1 tablet (100 mg total) by mouth daily.  Bethany [name], it was a pleasure seeing you today!

## 2018-01-12 ENCOUNTER — Telehealth: Payer: Self-pay | Admitting: Radiation Oncology

## 2018-01-12 NOTE — Telephone Encounter (Signed)
I spoke with the patient and she is going to see Dr. Brantley Stage next week to follow up on her seroma that's been drained 2 more times since her last visit with [place]. We will plan simulation next week provided she is not having problems with recurrent fluid accumulation. msg sent to sim to schedule.

## 2018-01-20 ENCOUNTER — Ambulatory Visit
Admission: RE | Admit: 2018-01-20 | Discharge: 2018-01-20 | Disposition: A | Discharge status: Home / Self Care | Payer: 59 | Source: Ambulatory Visit | Attending: Radiation Oncology | Admitting: Radiation Oncology

## 2018-01-20 DIAGNOSIS — C50412 Malignant neoplasm of upper-outer quadrant of left female breast: Secondary | ICD-10-CM | POA: Diagnosis not present

## 2018-01-20 DIAGNOSIS — Z510 Encounter for antineoplastic radiation therapy: Secondary | ICD-10-CM | POA: Diagnosis not present

## 2018-01-20 DIAGNOSIS — Z170 Estrogen receptor positive status [ER+]: Secondary | ICD-10-CM | POA: Diagnosis not present

## 2018-01-24 NOTE — Progress Notes (Signed)
  Radiation Oncology         [phone]) [phone] ________________________________  Name: Bethany [name] MRN: [id number]  Date: 01/20/2018  DOB: October 15, 1960  Optical Surface Tracking Plan:  Since intensity modulated radiotherapy (IMRT) and 3D conformal radiation treatment methods are predicated on accurate and precise positioning for treatment, intrafraction motion monitoring is medically necessary to ensure accurate and safe treatment delivery.  The ability to quantify intrafraction motion without excessive ionizing radiation dose can only be performed with optical surface tracking. Accordingly, surface imaging offers the opportunity to obtain 3D measurements of patient position throughout IMRT and 3D treatments without excessive radiation exposure.  I am ordering optical surface tracking for this patient's upcoming course of radiotherapy. ________________________________  Kyung Rudd, MD 01/24/2018 9:22 AM    Reference:   Particia Jasper, et al. Surface imaging-based analysis of intrafraction motion for breast radiotherapy patients.Journal of [place], n. 6, nov. 2014. ISSN [id number].   Available at: <http://www.jacmp.org/index.php/jacmp/article/view/4957>.

## 2018-01-24 NOTE — Progress Notes (Signed)
  Radiation Oncology         ([phone]) [phone] ________________________________  Name: Anicka Stuckert Weissman MRN: [id number]  Date: 01/20/2018  DOB: 1960/04/01  DIAGNOSIS:     ICD-10-CM   1. Malignant neoplasm of upper-outer quadrant of left breast in female, estrogen receptor positive ([place]) C50.412    Z17.0      SIMULATION AND TREATMENT PLANNING NOTE  The patient presented for simulation prior to beginning her course of radiation treatment for her diagnosis of left-sided breast cancer. The patient was placed in a supine position on a breast board. A customized vac-lock bag was constructed and this complex treatment device will be used on a daily basis during her treatment. In this fashion, a CT scan was obtained through the chest area and an isocenter was placed near the chest wall within the breast.  The patient will be planned to receive a course of radiation initially to a dose of 50.4 Gy. This will consist of a whole breast radiotherapy technique. To accomplish this, 2 customized blocks have been designed which will correspond to medial and lateral whole breast tangent fields. This treatment will be accomplished at 1.8 Gy per fraction. A forward planning technique will also be evaluated to determine if this approach improves the plan. It is anticipated that the patient will then receive a 10 Gy boost to the seroma cavity which has been contoured. This will be accomplished at 2 Gy per fraction.   This initial treatment will consist of a 3-D conformal technique. The seroma has been contoured as the primary target structure. Additionally, dose volume histograms of both this target as well as the lungs and heart will also be evaluated. Such an approach is necessary to ensure that the target area is adequately covered while the nearby critical  normal structures are adequately spared.  Plan:  The final anticipated total dose therefore will correspond to 60.4 Gy.   Special treatment procedure was  performed today due to the extra time and effort required by myself to plan and prepare this patient for deep inspiration breath hold technique.  I have determined cardiac sparing to be of benefit to this patient to prevent long term cardiac damage due to radiation of the heart.  Bellows were placed on the patient's abdomen. To facilitate cardiac sparing, the patient was coached by the radiation therapists on breath hold techniques and breathing practice was performed. Practice waveforms were obtained. The patient was then scanned while maintaining breath hold in the treatment position.  This image was then transferred over to the imaging specialist. The imaging specialist then created a fusion of the free breathing and breath hold scans using the chest wall as the stable structure. I personally reviewed the fusion in axial, coronal and sagittal image planes.  Excellent cardiac sparing was obtained.  I felt the patient is an appropriate candidate for breath hold and the patient will be treated as such.  The image fusion was then reviewed with the patient to reinforce the necessity of reproducible breath hold.    _______________________________   Jodelle Gross, MD, PhD

## 2018-01-26 DIAGNOSIS — C50412 Malignant neoplasm of upper-outer quadrant of left female breast: Secondary | ICD-10-CM | POA: Diagnosis not present

## 2018-01-26 DIAGNOSIS — Z510 Encounter for antineoplastic radiation therapy: Secondary | ICD-10-CM | POA: Diagnosis not present

## 2018-01-27 ENCOUNTER — Ambulatory Visit
Admission: RE | Admit: 2018-01-27 | Discharge: 2018-01-27 | Disposition: A | Discharge status: Home / Self Care | Payer: 59 | Source: Ambulatory Visit | Attending: Radiation Oncology | Admitting: Radiation Oncology

## 2018-01-27 DIAGNOSIS — Z170 Estrogen receptor positive status [ER+]: Secondary | ICD-10-CM | POA: Diagnosis not present

## 2018-01-27 DIAGNOSIS — C50412 Malignant neoplasm of upper-outer quadrant of left female breast: Secondary | ICD-10-CM | POA: Diagnosis not present

## 2018-01-27 DIAGNOSIS — Z510 Encounter for antineoplastic radiation therapy: Secondary | ICD-10-CM | POA: Diagnosis not present

## 2018-01-28 ENCOUNTER — Ambulatory Visit
Admission: RE | Admit: 2018-01-28 | Discharge: 2018-01-28 | Disposition: A | Discharge status: Home / Self Care | Payer: 59 | Source: Ambulatory Visit | Attending: Radiation Oncology | Admitting: Radiation Oncology

## 2018-01-28 DIAGNOSIS — Z510 Encounter for antineoplastic radiation therapy: Secondary | ICD-10-CM | POA: Diagnosis not present

## 2018-01-28 DIAGNOSIS — C50412 Malignant neoplasm of upper-outer quadrant of left female breast: Secondary | ICD-10-CM | POA: Diagnosis not present

## 2018-01-28 DIAGNOSIS — Z170 Estrogen receptor positive status [ER+]: Secondary | ICD-10-CM | POA: Diagnosis not present

## 2018-01-31 ENCOUNTER — Ambulatory Visit
Admission: RE | Admit: 2018-01-31 | Discharge: 2018-01-31 | Disposition: A | Discharge status: Home / Self Care | Payer: 59 | Source: Ambulatory Visit | Attending: Radiation Oncology | Admitting: Radiation Oncology

## 2018-01-31 DIAGNOSIS — I10 Essential (primary) hypertension: Secondary | ICD-10-CM | POA: Diagnosis not present

## 2018-01-31 DIAGNOSIS — Z510 Encounter for antineoplastic radiation therapy: Secondary | ICD-10-CM | POA: Diagnosis not present

## 2018-01-31 DIAGNOSIS — Z1159 Encounter for screening for other viral diseases: Secondary | ICD-10-CM | POA: Diagnosis not present

## 2018-01-31 DIAGNOSIS — C50412 Malignant neoplasm of upper-outer quadrant of left female breast: Secondary | ICD-10-CM | POA: Diagnosis not present

## 2018-02-01 ENCOUNTER — Ambulatory Visit
Admission: RE | Admit: 2018-02-01 | Discharge: 2018-02-01 | Disposition: A | Discharge status: Home / Self Care | Payer: 59 | Source: Ambulatory Visit | Attending: Radiation Oncology | Admitting: Radiation Oncology

## 2018-02-01 DIAGNOSIS — Z510 Encounter for antineoplastic radiation therapy: Secondary | ICD-10-CM | POA: Diagnosis not present

## 2018-02-01 DIAGNOSIS — C50412 Malignant neoplasm of upper-outer quadrant of left female breast: Secondary | ICD-10-CM | POA: Diagnosis not present

## 2018-02-02 ENCOUNTER — Ambulatory Visit
Admission: RE | Admit: 2018-02-02 | Discharge: 2018-02-02 | Disposition: A | Discharge status: Home / Self Care | Payer: 59 | Source: Ambulatory Visit | Attending: Radiation Oncology | Admitting: Radiation Oncology

## 2018-02-02 DIAGNOSIS — C50412 Malignant neoplasm of upper-outer quadrant of left female breast: Secondary | ICD-10-CM | POA: Diagnosis not present

## 2018-02-02 DIAGNOSIS — Z510 Encounter for antineoplastic radiation therapy: Secondary | ICD-10-CM | POA: Diagnosis not present

## 2018-02-03 ENCOUNTER — Ambulatory Visit
Admission: RE | Admit: 2018-02-03 | Discharge: 2018-02-03 | Disposition: A | Discharge status: Home / Self Care | Payer: 59 | Source: Ambulatory Visit | Attending: Radiation Oncology | Admitting: Radiation Oncology

## 2018-02-03 ENCOUNTER — Other Ambulatory Visit: Payer: Self-pay | Admitting: Obstetrics & Gynecology

## 2018-02-03 DIAGNOSIS — Z510 Encounter for antineoplastic radiation therapy: Secondary | ICD-10-CM | POA: Diagnosis not present

## 2018-02-03 DIAGNOSIS — C50412 Malignant neoplasm of upper-outer quadrant of left female breast: Secondary | ICD-10-CM | POA: Diagnosis not present

## 2018-02-04 ENCOUNTER — Ambulatory Visit
Admission: RE | Admit: 2018-02-04 | Discharge: 2018-02-04 | Disposition: A | Discharge status: Home / Self Care | Payer: 59 | Source: Ambulatory Visit | Attending: Radiation Oncology | Admitting: Radiation Oncology

## 2018-02-04 DIAGNOSIS — Z510 Encounter for antineoplastic radiation therapy: Secondary | ICD-10-CM | POA: Diagnosis not present

## 2018-02-04 DIAGNOSIS — C50412 Malignant neoplasm of upper-outer quadrant of left female breast: Secondary | ICD-10-CM | POA: Diagnosis not present

## 2018-02-06 ENCOUNTER — Ambulatory Visit
Admission: RE | Admit: 2018-02-06 | Discharge: 2018-02-06 | Disposition: A | Discharge status: Home / Self Care | Payer: 59 | Source: Ambulatory Visit | Attending: Radiation Oncology | Admitting: Radiation Oncology

## 2018-02-06 DIAGNOSIS — Z510 Encounter for antineoplastic radiation therapy: Secondary | ICD-10-CM | POA: Diagnosis not present

## 2018-02-06 DIAGNOSIS — C50412 Malignant neoplasm of upper-outer quadrant of left female breast: Secondary | ICD-10-CM | POA: Diagnosis not present

## 2018-02-07 ENCOUNTER — Ambulatory Visit
Admission: RE | Admit: 2018-02-07 | Discharge: 2018-02-07 | Disposition: A | Discharge status: Home / Self Care | Payer: 59 | Source: Ambulatory Visit | Attending: Radiation Oncology | Admitting: Radiation Oncology

## 2018-02-07 DIAGNOSIS — C50412 Malignant neoplasm of upper-outer quadrant of left female breast: Secondary | ICD-10-CM | POA: Diagnosis not present

## 2018-02-07 DIAGNOSIS — Z510 Encounter for antineoplastic radiation therapy: Secondary | ICD-10-CM | POA: Diagnosis not present

## 2018-02-08 ENCOUNTER — Ambulatory Visit
Admission: RE | Admit: 2018-02-08 | Discharge: 2018-02-08 | Disposition: A | Discharge status: Home / Self Care | Payer: 59 | Source: Ambulatory Visit | Attending: Radiation Oncology | Admitting: Radiation Oncology

## 2018-02-08 DIAGNOSIS — C50412 Malignant neoplasm of upper-outer quadrant of left female breast: Secondary | ICD-10-CM | POA: Diagnosis not present

## 2018-02-08 DIAGNOSIS — Z510 Encounter for antineoplastic radiation therapy: Secondary | ICD-10-CM | POA: Diagnosis not present

## 2018-02-09 ENCOUNTER — Ambulatory Visit
Admission: RE | Admit: 2018-02-09 | Discharge: 2018-02-09 | Disposition: A | Discharge status: Home / Self Care | Payer: 59 | Source: Ambulatory Visit | Attending: Radiation Oncology | Admitting: Radiation Oncology

## 2018-02-09 DIAGNOSIS — C50412 Malignant neoplasm of upper-outer quadrant of left female breast: Secondary | ICD-10-CM | POA: Diagnosis not present

## 2018-02-09 DIAGNOSIS — Z510 Encounter for antineoplastic radiation therapy: Secondary | ICD-10-CM | POA: Diagnosis not present

## 2018-02-14 ENCOUNTER — Ambulatory Visit
Admission: RE | Admit: 2018-02-14 | Discharge: 2018-02-14 | Disposition: A | Discharge status: Home / Self Care | Payer: 59 | Source: Ambulatory Visit | Attending: Radiation Oncology | Admitting: Radiation Oncology

## 2018-02-14 DIAGNOSIS — Z170 Estrogen receptor positive status [ER+]: Secondary | ICD-10-CM

## 2018-02-14 DIAGNOSIS — E559 Vitamin D deficiency, unspecified: Secondary | ICD-10-CM | POA: Diagnosis not present

## 2018-02-14 DIAGNOSIS — I10 Essential (primary) hypertension: Secondary | ICD-10-CM | POA: Diagnosis not present

## 2018-02-14 DIAGNOSIS — Z923 Personal history of irradiation: Secondary | ICD-10-CM | POA: Diagnosis not present

## 2018-02-14 DIAGNOSIS — K219 Gastro-esophageal reflux disease without esophagitis: Secondary | ICD-10-CM | POA: Diagnosis not present

## 2018-02-14 DIAGNOSIS — Z79899 Other long term (current) drug therapy: Secondary | ICD-10-CM | POA: Diagnosis not present

## 2018-02-14 DIAGNOSIS — Z800 Family history of malignant neoplasm of digestive organs: Secondary | ICD-10-CM | POA: Diagnosis not present

## 2018-02-14 DIAGNOSIS — M8580 Other specified disorders of bone density and structure, unspecified site: Secondary | ICD-10-CM | POA: Diagnosis not present

## 2018-02-14 DIAGNOSIS — I341 Nonrheumatic mitral (valve) prolapse: Secondary | ICD-10-CM | POA: Diagnosis not present

## 2018-02-14 DIAGNOSIS — Z510 Encounter for antineoplastic radiation therapy: Secondary | ICD-10-CM | POA: Insufficient documentation

## 2018-02-14 DIAGNOSIS — L539 Erythematous condition, unspecified: Secondary | ICD-10-CM | POA: Diagnosis not present

## 2018-02-14 DIAGNOSIS — Z87891 Personal history of nicotine dependence: Secondary | ICD-10-CM | POA: Diagnosis not present

## 2018-02-14 DIAGNOSIS — Z86010 Personal history of colonic polyps: Secondary | ICD-10-CM | POA: Diagnosis not present

## 2018-02-14 DIAGNOSIS — Z806 Family history of leukemia: Secondary | ICD-10-CM | POA: Diagnosis not present

## 2018-02-14 DIAGNOSIS — E039 Hypothyroidism, unspecified: Secondary | ICD-10-CM | POA: Diagnosis not present

## 2018-02-14 DIAGNOSIS — C50412 Malignant neoplasm of upper-outer quadrant of left female breast: Secondary | ICD-10-CM | POA: Insufficient documentation

## 2018-02-14 DIAGNOSIS — K449 Diaphragmatic hernia without obstruction or gangrene: Secondary | ICD-10-CM | POA: Diagnosis not present

## 2018-02-14 DIAGNOSIS — F419 Anxiety disorder, unspecified: Secondary | ICD-10-CM | POA: Diagnosis not present

## 2018-02-15 ENCOUNTER — Ambulatory Visit
Admission: RE | Admit: 2018-02-15 | Discharge: 2018-02-15 | Disposition: A | Discharge status: Home / Self Care | Payer: 59 | Source: Ambulatory Visit | Attending: Radiation Oncology | Admitting: Radiation Oncology

## 2018-02-15 DIAGNOSIS — M25562 Pain in left knee: Secondary | ICD-10-CM | POA: Diagnosis not present

## 2018-02-15 DIAGNOSIS — C50412 Malignant neoplasm of upper-outer quadrant of left female breast: Secondary | ICD-10-CM | POA: Diagnosis not present

## 2018-02-15 DIAGNOSIS — M1712 Unilateral primary osteoarthritis, left knee: Secondary | ICD-10-CM | POA: Diagnosis not present

## 2018-02-16 ENCOUNTER — Ambulatory Visit
Admission: RE | Admit: 2018-02-16 | Discharge: 2018-02-16 | Disposition: A | Discharge status: Home / Self Care | Payer: 59 | Source: Ambulatory Visit | Attending: Radiation Oncology | Admitting: Radiation Oncology

## 2018-02-16 DIAGNOSIS — C50412 Malignant neoplasm of upper-outer quadrant of left female breast: Secondary | ICD-10-CM | POA: Diagnosis not present

## 2018-02-17 ENCOUNTER — Ambulatory Visit
Admission: RE | Admit: 2018-02-17 | Discharge: 2018-02-17 | Disposition: A | Discharge status: Home / Self Care | Payer: 59 | Source: Ambulatory Visit | Attending: Radiation Oncology | Admitting: Radiation Oncology

## 2018-02-17 DIAGNOSIS — C50412 Malignant neoplasm of upper-outer quadrant of left female breast: Secondary | ICD-10-CM | POA: Diagnosis not present

## 2018-02-18 ENCOUNTER — Ambulatory Visit
Admission: RE | Admit: 2018-02-18 | Discharge: 2018-02-18 | Disposition: A | Discharge status: Home / Self Care | Payer: 59 | Source: Ambulatory Visit | Attending: Radiation Oncology | Admitting: Radiation Oncology

## 2018-02-18 ENCOUNTER — Encounter: Payer: Self-pay | Admitting: Internal Medicine

## 2018-02-18 DIAGNOSIS — C50412 Malignant neoplasm of upper-outer quadrant of left female breast: Secondary | ICD-10-CM | POA: Diagnosis not present

## 2018-02-20 ENCOUNTER — Ambulatory Visit: Payer: 59

## 2018-02-21 ENCOUNTER — Ambulatory Visit
Admission: RE | Admit: 2018-02-21 | Discharge: 2018-02-21 | Disposition: A | Discharge status: Home / Self Care | Payer: 59 | Source: Ambulatory Visit | Attending: Radiation Oncology | Admitting: Radiation Oncology

## 2018-02-21 DIAGNOSIS — C50412 Malignant neoplasm of upper-outer quadrant of left female breast: Secondary | ICD-10-CM | POA: Diagnosis not present

## 2018-02-22 ENCOUNTER — Ambulatory Visit
Admission: RE | Admit: 2018-02-22 | Discharge: 2018-02-22 | Disposition: A | Discharge status: Home / Self Care | Payer: 59 | Source: Ambulatory Visit | Attending: Radiation Oncology | Admitting: Radiation Oncology

## 2018-02-22 DIAGNOSIS — M1712 Unilateral primary osteoarthritis, left knee: Secondary | ICD-10-CM | POA: Diagnosis not present

## 2018-02-22 DIAGNOSIS — M25562 Pain in left knee: Secondary | ICD-10-CM | POA: Diagnosis not present

## 2018-02-22 DIAGNOSIS — C50412 Malignant neoplasm of upper-outer quadrant of left female breast: Secondary | ICD-10-CM | POA: Diagnosis not present

## 2018-02-23 ENCOUNTER — Telehealth: Payer: Self-pay

## 2018-02-23 ENCOUNTER — Ambulatory Visit
Admission: RE | Admit: 2018-02-23 | Discharge: 2018-02-23 | Disposition: A | Discharge status: Home / Self Care | Payer: 59 | Source: Ambulatory Visit | Attending: Radiation Oncology | Admitting: Radiation Oncology

## 2018-02-23 DIAGNOSIS — C50412 Malignant neoplasm of upper-outer quadrant of left female breast: Secondary | ICD-10-CM | POA: Diagnosis not present

## 2018-02-23 NOTE — Telephone Encounter (Signed)
Dr Henrene Pastor, This pt is scheduled for her colonoscopy on 03/15/18. Pt has a history of breast cancer and is going thru daily radiation treatments for the next few weeks prior to her colonoscopy.  Does pt need an OV or does she need to reschedule her colon for a later time. Please advise. Thanks, Gwyndolyn Saxon in [facility]

## 2018-02-24 ENCOUNTER — Ambulatory Visit
Admission: RE | Admit: 2018-02-24 | Discharge: 2018-02-24 | Disposition: A | Discharge status: Home / Self Care | Payer: 59 | Source: Ambulatory Visit | Attending: Radiation Oncology | Admitting: Radiation Oncology

## 2018-02-24 ENCOUNTER — Inpatient Hospital Stay: Payer: 59 | Attending: Oncology | Admitting: Nutrition

## 2018-02-24 DIAGNOSIS — F419 Anxiety disorder, unspecified: Secondary | ICD-10-CM | POA: Insufficient documentation

## 2018-02-24 DIAGNOSIS — K219 Gastro-esophageal reflux disease without esophagitis: Secondary | ICD-10-CM | POA: Insufficient documentation

## 2018-02-24 DIAGNOSIS — E559 Vitamin D deficiency, unspecified: Secondary | ICD-10-CM | POA: Insufficient documentation

## 2018-02-24 DIAGNOSIS — I10 Essential (primary) hypertension: Secondary | ICD-10-CM | POA: Insufficient documentation

## 2018-02-24 DIAGNOSIS — Z170 Estrogen receptor positive status [ER+]: Secondary | ICD-10-CM | POA: Insufficient documentation

## 2018-02-24 DIAGNOSIS — C50412 Malignant neoplasm of upper-outer quadrant of left female breast: Secondary | ICD-10-CM | POA: Diagnosis not present

## 2018-02-24 DIAGNOSIS — Z800 Family history of malignant neoplasm of digestive organs: Secondary | ICD-10-CM | POA: Insufficient documentation

## 2018-02-24 DIAGNOSIS — K449 Diaphragmatic hernia without obstruction or gangrene: Secondary | ICD-10-CM | POA: Insufficient documentation

## 2018-02-24 DIAGNOSIS — M8580 Other specified disorders of bone density and structure, unspecified site: Secondary | ICD-10-CM | POA: Insufficient documentation

## 2018-02-24 DIAGNOSIS — L539 Erythematous condition, unspecified: Secondary | ICD-10-CM | POA: Insufficient documentation

## 2018-02-24 DIAGNOSIS — Z86010 Personal history of colonic polyps: Secondary | ICD-10-CM | POA: Insufficient documentation

## 2018-02-24 DIAGNOSIS — Z87891 Personal history of nicotine dependence: Secondary | ICD-10-CM | POA: Insufficient documentation

## 2018-02-24 DIAGNOSIS — E039 Hypothyroidism, unspecified: Secondary | ICD-10-CM | POA: Insufficient documentation

## 2018-02-24 DIAGNOSIS — Z806 Family history of leukemia: Secondary | ICD-10-CM | POA: Insufficient documentation

## 2018-02-24 DIAGNOSIS — I341 Nonrheumatic mitral (valve) prolapse: Secondary | ICD-10-CM | POA: Insufficient documentation

## 2018-02-24 DIAGNOSIS — Z79899 Other long term (current) drug therapy: Secondary | ICD-10-CM | POA: Insufficient documentation

## 2018-02-24 DIAGNOSIS — Z923 Personal history of irradiation: Secondary | ICD-10-CM | POA: Insufficient documentation

## 2018-02-24 NOTE — Progress Notes (Signed)
57 year old female diagnosed with breast cancer receiving radiation therapy.  She is a patient of Dr. Jana Hakim.  Past medical history includes vitamin D deficiency, hypothyroidism, hypertension, and anxiety.  Medications include vitamin D2, Synthroid, prednisone, and Zoloft.  Labs include albumin 3.7.  Height: 66 inches. Weight: 204.2 pounds today. Usual body weight: 193 pounds in February 2019. BMI: 32.96.  Patient request nutrition consult to discuss healthy diet during treatment. She reports that she frequently gets dehydrated. She states she has been eating a lot more secondary to prednisone and other steroids. She has not been doing any type of exercise.  Nutrition diagnosis:  Food and nutrition related knowledge deficit related to breast cancer and associated treatments as evidenced by no prior need for nutrition related information.  Intervention: Patient educated to consume healthy plant-based diet with lean proteins. Discouraged excess concentrated sweets. Recommended increased hydration, primarily with water. Recommended 30 minutes of activity 5 days weekly. Provided fact sheets on plant-based diet, dehydration, and nutrition for breast cancer patients. Questions were answered.  Teach back method used.  Contact information provided.  Monitoring, evaluation, goals: Patient will tolerate plant-based diet to promote adequate healing and reduce risk for breast cancer recurrence.  No follow-up required.  Food and nutrition related knowledge deficit resolved.  **Disclaimer: This note was dictated with voice recognition software. Similar sounding words can inadvertently be transcribed and this note may contain transcription errors which may not have been corrected upon publication of note.**

## 2018-02-24 NOTE — Telephone Encounter (Signed)
Patient was called to discuss if she feels up to proceeding with the colonoscopy at this time while she is undergoing daily radiation therapy for breast cancer. No answer at either number. Left a message that we would continue to try to get in touch with her.   Riki Sheer, LPN ( [facility] )

## 2018-02-24 NOTE — Telephone Encounter (Signed)
With just adjuvant radiation therapy, it is okay for her to proceed with colonoscopy (IF THAT is WHAT SHE WANTS).  If she prefers rescheduling to another time, that is fine as well.  Thanks for checking

## 2018-02-25 ENCOUNTER — Ambulatory Visit
Admission: RE | Admit: 2018-02-25 | Discharge: 2018-02-25 | Disposition: A | Discharge status: Home / Self Care | Payer: 59 | Source: Ambulatory Visit | Attending: Radiation Oncology | Admitting: Radiation Oncology

## 2018-02-25 DIAGNOSIS — C50412 Malignant neoplasm of upper-outer quadrant of left female breast: Secondary | ICD-10-CM | POA: Diagnosis not present

## 2018-02-25 NOTE — Telephone Encounter (Signed)
Spoke with patient. She does fell like having the colon at this time and her radiologist said it was ok to proceed. Pt aware of her appointments.

## 2018-02-25 NOTE — Telephone Encounter (Signed)
Patient returning Nancy's call. Patient states she still wants to have procedure done and her radiation therapist has approved.

## 2018-02-28 ENCOUNTER — Ambulatory Visit
Admission: RE | Admit: 2018-02-28 | Discharge: 2018-02-28 | Disposition: A | Discharge status: Home / Self Care | Payer: 59 | Source: Ambulatory Visit | Attending: Radiation Oncology | Admitting: Radiation Oncology

## 2018-02-28 DIAGNOSIS — C50412 Malignant neoplasm of upper-outer quadrant of left female breast: Secondary | ICD-10-CM | POA: Diagnosis not present

## 2018-02-28 NOTE — Progress Notes (Signed)
[facility]  [phone] Fax:(336) [phone]     ID: Bethany [name] DOB: 1961-02-23  MR#: [id number]  XNA#:[id number]  Patient Care Team: Bethany Cha, MD as PCP - General (Internal Medicine) Bethany Luna, MD as Consulting Physician (General Surgery) Bethany [name], Bethany Dad, MD as Consulting Physician (Oncology) Bethany Rudd, MD as Consulting Physician (Radiation Oncology) Bethany Shipper, MD as Consulting Physician (Gastroenterology) Bethany Bruins, MD as Consulting Physician (Obstetrics and Gynecology) Bethany Rend, MD as Consulting Physician (Endocrinology) OTHER MD:   CHIEF COMPLAINT: Estrogen receptor positive breast cancer  CURRENT TREATMENT: To start tamoxifen 04/16/2018  HISTORY OF CURRENT ILLNESS: The original intake note:  "Bethany [name]" had routine screening mammography on 10/26/2017 showing a possible abnormality in the left breast. She underwent unilateral left diagnostic mammography with tomography and left breast ultrasonography at [facility] on 10/25/2017 showing: breast density category B. There was a hypoechoic lesion consistent of a mass at the 2:30 o'clock upper outer quadrant middle depth and measuring 0.5 x 0.3 x 0.4 cm. Sonographic evaluation of the left axilla shows no enlarged or abnormal lymph nodes.  An attempt was made to obtain a biopsy of this lesion on 10/21/2017, however the patient had repeated episodes of syncope during the attempted procedure.  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 Margins were negative for carcinoma. Prognostic indicators significant for: estrogen receptor, 90% positive and progesterone receptor, 80% positive, both with strong staining intensity. Proliferation marker Ki67 at 3%. HER2 negative with an immunohistochemistry of (1+).  Then in a separate procedure she underwent left sentinel lymph node sampling  on 12/09/2017 showing ([id number]): Four left axillary sentinel lymph were negative for carcinoma. (0/4).   Her subsequent history is as detailed below.   INTERVAL HISTORY: Bethany [name] returns today for follow-up of her estrogen receptor positive breast cancer.   She is doing well with radiation, with some erythema but no significant desquamation and no significant fatigue.  She will complete radiation on 03/15/2018 and will have colonoscopy the same day   REVIEW OF SYSTEMS: Bethany [name] has been doing well overall. She has been working on getting Christmas presents together for her grandchildren. She recently had a steroid shot in her knees by Dr. Rolm [name]. She does not have a formal exercise routine, but she tries to keep herself moving. Recently, she had hives on her right knee, which she attributed to some stiches. The hives were treated and have since resolved. The patient denies unusual headaches, visual changes, nausea, vomiting, or dizziness. There has been no unusual cough, phlegm production, or pleurisy. This been no change in bowel or bladder habits. The patient denies unexplained fatigue or unexplained weight loss, bleeding, or fever. A detailed review of systems was otherwise noncontributory.    PAST MEDICAL HISTORY: Past Medical History:  Diagnosis Date  . Anxiety   . Breast mass, left 11/2017  . Dental crowns present   . History of hiatal hernia    no current med.  . Hypertension    states under control with med., has been on med. x 1 yr.  . Hypothyroidism   . MVP (mitral valve prolapse)   . PONV (postoperative nausea and vomiting)   . Vitamin D deficiency   Thyroid cancer, GERD  PAST SURGICAL HISTORY: Past Surgical History:  Procedure Laterality Date  . ABDOMINAL HYSTERECTOMY     partial  . APPENDECTOMY    . AXILLARY SENTINEL NODE BIOPSY Left 12/09/2017  Procedure: AXILLARY SENTINEL NODE BIOPSY;  Surgeon: Bethany Luna, MD;  Location: [facility];   Service: General;  Laterality: Left;  . BREAST LUMPECTOMY WITH NEEDLE LOCALIZATION Left 11/25/2017   Procedure: LEFT BREAST LUMPECTOMY WITH NEEDLE LOCALIZATION;  Surgeon: Bethany Luna, MD;  Location: [facility];  Service: General;  Laterality: Left;  . COLONOSCOPY WITH PROPOFOL  10/03/2015  . LAPAROSCOPIC APPENDECTOMY N/A 10/03/2015   Procedure: APPENDECTOMY LAPAROSCOPIC;  Surgeon: Bethany Bookbinder, MD;  Location: [facility];  Service: General;  Laterality: N/A;  . TOTAL THYROIDECTOMY  04/02/2003  Hysterectomy without BSO  FAMILY HISTORY Family History  Problem Relation Age of Onset  . Hypertension Father   . Diabetes Father   . Heart disease Father   . Hypertension Brother   . Heart disease Paternal Grandfather   . Colon cancer Other        great maternal aunt   . Colon polyps Mother    The patient's father died in Apr 23, 2017 age age 33 due to non-Hodgkin's lymphoma. The patient's mother is alive at age 35 as of October 2019. The patient has 1 brother, no sisters. There was a paternal aunt with lung cancer. There was a paternal grandmother and maternal grandfather with throat cancer. She denies a family history of breast or ovarian cancer.    GYNECOLOGIC HISTORY:  No LMP recorded. Patient has had a hysterectomy. Menarche: 57 years old Age at first live birth: 57 years old She is GX P3.  She is status post partial hysterectomy (without BSO) in her late 42's. She did not have HRT.    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 Her husband, Bethany [name], is a Clinical cytogeneticist for [employer]. The patient's oldest son, Bethany [name], has 3 children and lives in [place] and works in Nurse, children's for [employer]. The patient's son, Bethany [name] lives in [place] with 1 daughter and works as a Teacher, music. The patient's youngest son, Bethany [name] lives in [place] and works for [employer] in [place]. The patient has 4 grandchildren total. She does not currently belong to a church, but she is  Psychologist, forensic.     ADVANCED DIRECTIVES:    HEALTH MAINTENANCE: Social History   Tobacco Use  . Smoking status: Former Smoker    Last attempt to quit: 08/14/2014    Years since quitting: 3.5  . Smokeless tobacco: Never Used  Substance Use Topics  . Alcohol use: Yes    Comment: rarely  . Drug use: No     Colonoscopy: Due in December 2019- to be repeated in 3 years under Dr. Henrene Pastor  PAP:   Bone density: 10/25/2017 showed osteopenia   Allergies  Allergen Reactions  . Oxycodone Nausea And Vomiting  . Latex Rash    Current Outpatient Medications  Medication Sig Dispense Refill  . cetirizine (ZYRTEC ALLERGY) 10 MG tablet Take 1 tablet (10 mg total) by mouth daily. 30 tablet 1  . ergocalciferol (VITAMIN D2) 50000 UNITS capsule Take 50,000 Units by mouth 2 (two) times a week. On Thurs & Sun    . fexofenadine (ALLEGRA ALLERGY) 60 MG tablet Take 1 tablet (60 mg total) by mouth 2 (two) times daily. 60 tablet 1  . fluconazole (DIFLUCAN) 100 MG tablet Take 1 tablet (100 mg total) by mouth daily. 15 tablet 0  . ibuprofen (ADVIL,MOTRIN) 800 MG tablet Take 1 tablet (800 mg total) by mouth every 8 (eight) hours as needed. 30 tablet 0  . levothyroxine (SYNTHROID, LEVOTHROID) 137 MCG tablet Take 150 mcg  by mouth at bedtime.     Marland Kitchen losartan (COZAAR) 25 MG tablet Take 25 mg by mouth daily.    . phentermine 37.5 MG capsule Take 1 capsule (37.5 mg total) by mouth every morning. (Patient not taking: Reported on 01/10/2018) 30 capsule 1  . predniSONE (DELTASONE) 5 MG tablet 6 tab x 2 day, 5 tab x 2 day, 4 tab x 2 day, 3 tab x 2 day, 2 tab x 2 day, 1 tab x 2 day, stop 42 tablet 0  . sertraline (ZOLOFT) 100 MG tablet TAKE 1 TABLET BY MOUTH EVERY DAY 90 tablet 2   No current facility-administered medications for this visit.     OBJECTIVE: Middle-aged white woman who appears well  Vitals:   03/01/18 1430  BP: 133/75  Pulse: 66  Resp: 18  Temp: 98 F (36.7 C)  SpO2: 98%     Body mass index is 32.67  kg/m.   Wt Readings from Last 3 Encounters:  03/01/18 202 lb 6.4 oz (91.8 kg)  02/24/18 204 lb 3.2 oz (92.6 kg)  01/06/18 198 lb 14.4 oz (90.2 kg)      ECOG FS:1 - Symptomatic but completely ambulatory  Sclerae unicteric, EOMs intact No cervical or supraclavicular adenopathy Lungs no rales or rhonchi Heart regular rate and rhythm Abd soft, nontender, positive bowel sounds MSK no focal spinal tenderness, no upper extremity lymphedema Neuro: nonfocal, well oriented, appropriate affect Breasts: The right breast is benign.  The left breast is status post lumpectomy and is currently receiving radiation.  There is erythema but no desquamation.  There are no suspicious findings.  Both axillae are benign.  LAB RESULTS:  CMP     Component Value Date/Time   NA 141 03/01/2018 1358   K 4.2 03/01/2018 1358   CL 106 03/01/2018 1358   CO2 28 03/01/2018 1358   GLUCOSE 99 03/01/2018 1358   BUN 12 03/01/2018 1358   CREATININE 0.85 03/01/2018 1358   CREATININE 0.78 12/16/2017 1540   CALCIUM 8.7 (L) 03/01/2018 1358   PROT 6.7 03/01/2018 1358   ALBUMIN 3.8 03/01/2018 1358   AST 14 (L) 03/01/2018 1358   AST 12 (L) 12/16/2017 1540   ALT 20 03/01/2018 1358   ALT 16 12/16/2017 1540   ALKPHOS 77 03/01/2018 1358   BILITOT 0.4 03/01/2018 1358   BILITOT 0.4 12/16/2017 1540   GFRNONAA >60 03/01/2018 1358   GFRNONAA >60 12/16/2017 1540   GFRAA >60 03/01/2018 1358   GFRAA >60 12/16/2017 1540    No results found for: TOTALPROTELP, ALBUMINELP, A1GS, A2GS, BETS, BETA2SER, GAMS, MSPIKE, SPEI  No results found for: KPAFRELGTCHN, LAMBDASER, KAPLAMBRATIO  Lab Results  Component Value Date   WBC 5.7 03/01/2018   NEUTROABS 3.6 03/01/2018   HGB 12.3 03/01/2018   HCT 39.0 03/01/2018   MCV 93.3 03/01/2018   PLT 317 03/01/2018    _0 @  No results found for: LABCA2  No components found for: RJJOAC166  No results for input(s): INR in the last 168 hours.  No results found for:  LABCA2  No results found for: AYT016  No results found for: WFU932  No results found for: TFT732  No results found for: CA2729  No components found for: HGQUANT  No results found for: CEA1 / No results found for: CEA1   No results found for: AFPTUMOR  No results found for: CHROMOGRNA  No results found for: PSA1  Appointment on 03/01/2018  Component Date Value Ref Range Status  . Sodium 03/01/2018  141  135 - 145 mmol/L Final  . Potassium 03/01/2018 4.2  3.5 - 5.1 mmol/L Final  . Chloride 03/01/2018 106  98 - 111 mmol/L Final  . CO2 03/01/2018 28  22 - 32 mmol/L Final  . Glucose, Bld 03/01/2018 99  70 - 99 mg/dL Final  . BUN 03/01/2018 12  6 - 20 mg/dL Final  . Creatinine, Ser 03/01/2018 0.85  0.44 - 1.00 mg/dL Final  . Calcium 03/01/2018 8.7* 8.9 - 10.3 mg/dL Final  . Total Protein 03/01/2018 6.7  6.5 - 8.1 g/dL Final  . Albumin 03/01/2018 3.8  3.5 - 5.0 g/dL Final  . AST 03/01/2018 14* 15 - 41 U/L Final  . ALT 03/01/2018 20  0 - 44 U/L Final  . Alkaline Phosphatase 03/01/2018 77  38 - 126 U/L Final  . Total Bilirubin 03/01/2018 0.4  0.3 - 1.2 mg/dL Final  . GFR calc non Af Amer 03/01/2018 >60  >60 mL/min Final  . GFR calc Af Amer 03/01/2018 >60  >60 mL/min Final  . Anion gap 03/01/2018 7  5 - 15 Final   Performed at [facility], [place] [address], [place], [place] [address]  . WBC 03/01/2018 5.7  4.0 - 10.5 K/uL Final  . RBC 03/01/2018 4.18  3.87 - 5.11 MIL/uL Final  . Hemoglobin 03/01/2018 12.3  12.0 - 15.0 g/dL Final  . HCT 03/01/2018 39.0  36.0 - 46.0 % Final  . MCV 03/01/2018 93.3  80.0 - 100.0 fL Final  . MCH 03/01/2018 29.4  26.0 - 34.0 pg Final  . MCHC 03/01/2018 31.5  30.0 - 36.0 g/dL Final  . RDW 03/01/2018 13.2  11.5 - 15.5 % Final  . Platelets 03/01/2018 317  150 - 400 K/uL Final  . nRBC 03/01/2018 0.0  0.0 - 0.2 % Final  . Neutrophils Relative % 03/01/2018 64  % Final  . Neutro Abs 03/01/2018 3.6  1.7 - 7.7 K/uL Final  .  Lymphocytes Relative 03/01/2018 25  % Final  . Lymphs Abs 03/01/2018 1.4  0.7 - 4.0 K/uL Final  . Monocytes Relative 03/01/2018 7  % Final  . Monocytes Absolute 03/01/2018 0.4  0.1 - 1.0 K/uL Final  . Eosinophils Relative 03/01/2018 3  % Final  . Eosinophils Absolute 03/01/2018 0.2  0.0 - 0.5 K/uL Final  . Basophils Relative 03/01/2018 1  % Final  . Basophils Absolute 03/01/2018 0.0  0.0 - 0.1 K/uL Final  . Immature Granulocytes 03/01/2018 0  % Final  . Abs Immature Granulocytes 03/01/2018 0.02  0.00 - 0.07 K/uL Final   Performed at [facility], [place] [address] [place], [place] [address]    (this displays the last labs from the last 3 days)  No results found for: TOTALPROTELP, ALBUMINELP, A1GS, A2GS, BETS, BETA2SER, GAMS, MSPIKE, SPEI (this displays SPEP labs)  No results found for: KPAFRELGTCHN, LAMBDASER, KAPLAMBRATIO (kappa/lambda light chains)  No results found for: HGBA, HGBA2QUANT, HGBFQUANT, HGBSQUAN (Hemoglobinopathy evaluation)   No results found for: LDH  No results found for: IRON, TIBC, IRONPCTSAT (Iron and TIBC)  No results found for: FERRITIN  Urinalysis    Component Value Date/Time   COLORURINE YELLOW 10/02/2015 2022   APPEARANCEUR CLOUDY (A) 10/02/2015 2022   LABSPEC 1.020 10/02/2015 2022   PHURINE 6.5 10/02/2015 2022   GLUCOSEU NEGATIVE 10/02/2015 2022   HGBUR NEGATIVE 10/02/2015 2022   BILIRUBINUR NEGATIVE 10/02/2015 2022   KETONESUR 15 (A) 10/02/2015 2022   PROTEINUR NEGATIVE 10/02/2015  2022   UROBILINOGEN 0.2 09/20/2014 1536   NITRITE NEGATIVE 10/02/2015 2022   LEUKOCYTESUR SMALL (A) 10/02/2015 2022     STUDIES: No results found.  ELIGIBLE FOR AVAILABLE RESEARCH PROTOCOL: No  ASSESSMENT: 57 y.o. [name], [place] woman status post left breast upper outer quadrant lumpectomy 11/25/2017 for a pT1b pNX, stage Ia invasive ductal carcinoma, grade 1, estrogen and progesterone receptor positive, HER-2/neu negative, with an  MIB-1 of 3%  (1) status post left axillary lymph node sampling 12/09/2017, all 4 sentinel lymph nodes clear  (2) Oncotype score of 24 predicts a 10-year risk of recurrence outside the breast over the next 9 years if the patient's only systemic therapy is an estrogens for 5 years.  It also predicts no benefit from chemotherapy  (3) adjuvant radiation to be completed 03/15/2018  (4) to start tamoxifen 04/16/2018  PLAN: Bethany [name] is tolerating her radiation remarkably well and she will be done in about 2 weeks.  Today we spent most of the visit discussing antiestrogens.  She understands the difference between tamoxifen and anastrozole in detail. She understands that anastrozole and the aromatase inhibitors in general work by blocking estrogen production. Accordingly vaginal dryness, decrease in bone density, and of course hot flashes can result. The aromatase inhibitors can also negatively affect the cholesterol profile, although that is a minor effect. One out of 5 women on aromatase inhibitors we will feel "old and achy". This arthralgia/myalgia syndrome, which resembles fibromyalgia clinically, does resolve with stopping the medications. Accordingly this is not a reason to not try an aromatase inhibitor but it is a frequent reason to stop it (in other words 20% of women will not be able to tolerate these medications).  Tamoxifen on the other hand does not block estrogen production. It does not "take away a woman's estrogen". It blocks the estrogen receptor in breast cells. Like anastrozole, it can also cause hot flashes. As opposed to anastrozole, tamoxifen has many estrogen-like effects. It is technically an estrogen receptor modulator. This means that in some tissues tamoxifen works like estrogen-- for example it helps strengthen the bones. It tends to improve the cholesterol profile. It can cause thickening of the endometrial lining, and even endometrial polyps or rarely cancer of the uterus.(The risk  of uterine cancer due to tamoxifen is one additional cancer per thousand women year). It can cause vaginal wetness or stickiness. It can cause blood clots through this estrogen-like effect--the risk of blood clots with tamoxifen is exactly the same as with birth control pills or hormone replacement.  Neither of these agents causes mood changes or weight gain, despite the popular belief that they can have these side effects. We have data from studies comparing either of these drugs with placebo, and in those cases the control group had the same amount of weight gain and depression as the group that took the drug.  Since she is status post hysterectomy, is sexually active and already has osteopenia I think tamoxifen is a much better choice for her, assuming of course she tolerates it well.  I think it would be a good idea for her to take a month off once she finishes radiation and then start tamoxifen 04/16/2018.  She will see me a few weeks later to make sure she is tolerating it well.  I also encouraged her to enroll in the finding your new normal group.   She knows to call for any other issue that may develop before her next visit here. Elleni Mozingo, Bethany Dad, MD  03/01/18 3:16 PM Medical Oncology and Hematology [facility] [address] [place], [place] [address] Tel. [phone]    Fax. [phone]   I, Jacqualyn Posey am acting as a Education administrator for Chauncey Cruel, MD.   I, Lurline Del MD, have reviewed the above documentation for accuracy and completeness, and I agree with the above.

## 2018-03-01 ENCOUNTER — Ambulatory Visit
Admission: RE | Admit: 2018-03-01 | Discharge: 2018-03-01 | Disposition: A | Discharge status: Home / Self Care | Payer: 59 | Source: Ambulatory Visit | Attending: Radiation Oncology | Admitting: Radiation Oncology

## 2018-03-01 ENCOUNTER — Inpatient Hospital Stay (HOSPITAL_BASED_OUTPATIENT_CLINIC_OR_DEPARTMENT_OTHER): Payer: 59 | Admitting: Oncology

## 2018-03-01 ENCOUNTER — Inpatient Hospital Stay: Payer: 59

## 2018-03-01 ENCOUNTER — Telehealth: Payer: Self-pay | Admitting: Oncology

## 2018-03-01 VITALS — BP 133/75 | HR 66 | Temp 98.0°F | Resp 18 | Ht 66.0 in | Wt 202.4 lb

## 2018-03-01 DIAGNOSIS — C50412 Malignant neoplasm of upper-outer quadrant of left female breast: Secondary | ICD-10-CM

## 2018-03-01 DIAGNOSIS — Z86010 Personal history of colonic polyps: Secondary | ICD-10-CM

## 2018-03-01 DIAGNOSIS — Z923 Personal history of irradiation: Secondary | ICD-10-CM

## 2018-03-01 DIAGNOSIS — Z170 Estrogen receptor positive status [ER+]: Principal | ICD-10-CM

## 2018-03-01 DIAGNOSIS — Z800 Family history of malignant neoplasm of digestive organs: Secondary | ICD-10-CM

## 2018-03-01 DIAGNOSIS — I10 Essential (primary) hypertension: Secondary | ICD-10-CM

## 2018-03-01 DIAGNOSIS — E039 Hypothyroidism, unspecified: Secondary | ICD-10-CM

## 2018-03-01 DIAGNOSIS — M8580 Other specified disorders of bone density and structure, unspecified site: Secondary | ICD-10-CM | POA: Diagnosis not present

## 2018-03-01 DIAGNOSIS — I341 Nonrheumatic mitral (valve) prolapse: Secondary | ICD-10-CM

## 2018-03-01 DIAGNOSIS — Z79899 Other long term (current) drug therapy: Secondary | ICD-10-CM

## 2018-03-01 DIAGNOSIS — Z806 Family history of leukemia: Secondary | ICD-10-CM

## 2018-03-01 DIAGNOSIS — Z87891 Personal history of nicotine dependence: Secondary | ICD-10-CM

## 2018-03-01 DIAGNOSIS — L539 Erythematous condition, unspecified: Secondary | ICD-10-CM | POA: Diagnosis not present

## 2018-03-01 DIAGNOSIS — K219 Gastro-esophageal reflux disease without esophagitis: Secondary | ICD-10-CM

## 2018-03-01 DIAGNOSIS — K449 Diaphragmatic hernia without obstruction or gangrene: Secondary | ICD-10-CM

## 2018-03-01 DIAGNOSIS — E559 Vitamin D deficiency, unspecified: Secondary | ICD-10-CM

## 2018-03-01 DIAGNOSIS — F419 Anxiety disorder, unspecified: Secondary | ICD-10-CM

## 2018-03-01 LAB — COMPREHENSIVE METABOLIC PANEL
ALK PHOS: 77 U/L (ref 38–126)
ALT: 20 U/L (ref 0–44)
ANION GAP: 7 (ref 5–15)
AST: 14 U/L — ABNORMAL LOW (ref 15–41)
Albumin: 3.8 g/dL (ref 3.5–5.0)
BUN: 12 mg/dL (ref 6–20)
CALCIUM: 8.7 mg/dL — AB (ref 8.9–10.3)
CO2: 28 mmol/L (ref 22–32)
CREATININE: 0.85 mg/dL (ref 0.44–1.00)
Chloride: 106 mmol/L (ref 98–111)
Glucose, Bld: 99 mg/dL (ref 70–99)
Potassium: 4.2 mmol/L (ref 3.5–5.1)
Sodium: 141 mmol/L (ref 135–145)
Total Bilirubin: 0.4 mg/dL (ref 0.3–1.2)
Total Protein: 6.7 g/dL (ref 6.5–8.1)

## 2018-03-01 LAB — CBC WITH DIFFERENTIAL/PLATELET
Abs Immature Granulocytes: 0.02 10*3/uL (ref 0.00–0.07)
BASOS ABS: 0 10*3/uL (ref 0.0–0.1)
BASOS PCT: 1 %
EOS ABS: 0.2 10*3/uL (ref 0.0–0.5)
EOS PCT: 3 %
HEMATOCRIT: 39 % (ref 36.0–46.0)
Hemoglobin: 12.3 g/dL (ref 12.0–15.0)
Immature Granulocytes: 0 %
Lymphocytes Relative: 25 %
Lymphs Abs: 1.4 10*3/uL (ref 0.7–4.0)
MCH: 29.4 pg (ref 26.0–34.0)
MCHC: 31.5 g/dL (ref 30.0–36.0)
MCV: 93.3 fL (ref 80.0–100.0)
Monocytes Absolute: 0.4 10*3/uL (ref 0.1–1.0)
Monocytes Relative: 7 %
NEUTROS PCT: 64 %
NRBC: 0 % (ref 0.0–0.2)
Neutro Abs: 3.6 10*3/uL (ref 1.7–7.7)
PLATELETS: 317 10*3/uL (ref 150–400)
RBC: 4.18 MIL/uL (ref 3.87–5.11)
RDW: 13.2 % (ref 11.5–15.5)
WBC: 5.7 10*3/uL (ref 4.0–10.5)

## 2018-03-01 MED ORDER — FLUCONAZOLE 100 MG PO TABS: 100.0000 mg | ORAL_TABLET | Freq: Every day | ORAL | 0 refills | Status: DC

## 2018-03-01 MED ORDER — TAMOXIFEN CITRATE 20 MG PO TABS: 20.0000 mg | ORAL_TABLET | Freq: Every day | ORAL | 12 refills | Status: AC

## 2018-03-01 NOTE — Telephone Encounter (Signed)
Gave avs and calendar ° °

## 2018-03-02 ENCOUNTER — Ambulatory Visit: Payer: 59

## 2018-03-02 ENCOUNTER — Ambulatory Visit
Admission: RE | Admit: 2018-03-02 | Discharge: 2018-03-02 | Disposition: A | Discharge status: Home / Self Care | Payer: 59 | Source: Ambulatory Visit | Attending: Radiation Oncology | Admitting: Radiation Oncology

## 2018-03-02 VITALS — Ht 66.0 in | Wt 202.4 lb

## 2018-03-02 DIAGNOSIS — C50412 Malignant neoplasm of upper-outer quadrant of left female breast: Secondary | ICD-10-CM | POA: Diagnosis not present

## 2018-03-02 DIAGNOSIS — D369 Benign neoplasm, unspecified site: Secondary | ICD-10-CM

## 2018-03-02 DIAGNOSIS — Z8371 Family history of colonic polyps: Secondary | ICD-10-CM

## 2018-03-02 MED ORDER — NA SULFATE-K SULFATE-MG SULF 17.5-3.13-1.6 GM/177ML PO SOLN: 1.0000 | Freq: Once | ORAL | 0 refills | Status: AC

## 2018-03-02 NOTE — Progress Notes (Signed)
Per pt, no allergies to soy or egg products.Pt not taking any weight loss meds or using  O2 at home.  Pt refused emmi video. 

## 2018-03-02 NOTE — Addendum Note (Signed)
Addended by: Selina Cooley on: 03/02/2018 04:34 PM   Modules accepted: Orders

## 2018-03-03 ENCOUNTER — Encounter: Payer: Self-pay | Admitting: Internal Medicine

## 2018-03-03 ENCOUNTER — Ambulatory Visit
Admission: RE | Admit: 2018-03-03 | Discharge: 2018-03-03 | Disposition: A | Discharge status: Home / Self Care | Payer: 59 | Source: Ambulatory Visit | Attending: Radiation Oncology | Admitting: Radiation Oncology

## 2018-03-03 DIAGNOSIS — C50412 Malignant neoplasm of upper-outer quadrant of left female breast: Secondary | ICD-10-CM | POA: Diagnosis not present

## 2018-03-04 ENCOUNTER — Ambulatory Visit: Payer: 59 | Admitting: Radiation Oncology

## 2018-03-04 ENCOUNTER — Ambulatory Visit
Admission: RE | Admit: 2018-03-04 | Discharge: 2018-03-04 | Disposition: A | Discharge status: Home / Self Care | Payer: 59 | Source: Ambulatory Visit | Attending: Radiation Oncology | Admitting: Radiation Oncology

## 2018-03-04 DIAGNOSIS — C50412 Malignant neoplasm of upper-outer quadrant of left female breast: Secondary | ICD-10-CM | POA: Diagnosis not present

## 2018-03-06 ENCOUNTER — Other Ambulatory Visit: Payer: Self-pay | Admitting: Medical

## 2018-03-06 DIAGNOSIS — L509 Urticaria, unspecified: Secondary | ICD-10-CM

## 2018-03-07 ENCOUNTER — Ambulatory Visit
Admission: RE | Admit: 2018-03-07 | Discharge: 2018-03-07 | Disposition: A | Discharge status: Home / Self Care | Payer: 59 | Source: Ambulatory Visit | Attending: Radiation Oncology | Admitting: Radiation Oncology

## 2018-03-07 DIAGNOSIS — E890 Postprocedural hypothyroidism: Secondary | ICD-10-CM | POA: Diagnosis not present

## 2018-03-07 DIAGNOSIS — E559 Vitamin D deficiency, unspecified: Secondary | ICD-10-CM | POA: Diagnosis not present

## 2018-03-07 DIAGNOSIS — C50412 Malignant neoplasm of upper-outer quadrant of left female breast: Secondary | ICD-10-CM | POA: Diagnosis not present

## 2018-03-07 DIAGNOSIS — Z86010 Personal history of colonic polyps: Secondary | ICD-10-CM | POA: Diagnosis not present

## 2018-03-07 DIAGNOSIS — E538 Deficiency of other specified B group vitamins: Secondary | ICD-10-CM | POA: Diagnosis not present

## 2018-03-07 DIAGNOSIS — Z1211 Encounter for screening for malignant neoplasm of colon: Secondary | ICD-10-CM | POA: Diagnosis not present

## 2018-03-07 DIAGNOSIS — E669 Obesity, unspecified: Secondary | ICD-10-CM | POA: Diagnosis not present

## 2018-03-08 ENCOUNTER — Ambulatory Visit: Payer: 59

## 2018-03-08 ENCOUNTER — Ambulatory Visit
Admission: RE | Admit: 2018-03-08 | Discharge: 2018-03-08 | Disposition: A | Discharge status: Home / Self Care | Payer: 59 | Source: Ambulatory Visit | Attending: Radiation Oncology | Admitting: Radiation Oncology

## 2018-03-08 DIAGNOSIS — C50412 Malignant neoplasm of upper-outer quadrant of left female breast: Secondary | ICD-10-CM | POA: Diagnosis not present

## 2018-03-10 ENCOUNTER — Ambulatory Visit
Admission: RE | Admit: 2018-03-10 | Discharge: 2018-03-10 | Disposition: A | Discharge status: Home / Self Care | Payer: 59 | Source: Ambulatory Visit | Attending: Radiation Oncology | Admitting: Radiation Oncology

## 2018-03-10 ENCOUNTER — Ambulatory Visit: Payer: 59

## 2018-03-10 DIAGNOSIS — C50412 Malignant neoplasm of upper-outer quadrant of left female breast: Secondary | ICD-10-CM | POA: Diagnosis not present

## 2018-03-11 ENCOUNTER — Ambulatory Visit
Admission: RE | Admit: 2018-03-11 | Discharge: 2018-03-11 | Disposition: A | Discharge status: Home / Self Care | Payer: 59 | Source: Ambulatory Visit | Attending: Radiation Oncology | Admitting: Radiation Oncology

## 2018-03-11 ENCOUNTER — Ambulatory Visit: Payer: 59

## 2018-03-11 DIAGNOSIS — C50412 Malignant neoplasm of upper-outer quadrant of left female breast: Secondary | ICD-10-CM | POA: Diagnosis not present

## 2018-03-12 ENCOUNTER — Ambulatory Visit: Payer: 59

## 2018-03-14 ENCOUNTER — Ambulatory Visit
Admission: RE | Admit: 2018-03-14 | Discharge: 2018-03-14 | Disposition: A | Discharge status: Home / Self Care | Payer: 59 | Source: Ambulatory Visit | Attending: Radiation Oncology | Admitting: Radiation Oncology

## 2018-03-14 ENCOUNTER — Ambulatory Visit: Payer: 59

## 2018-03-14 DIAGNOSIS — E538 Deficiency of other specified B group vitamins: Secondary | ICD-10-CM | POA: Diagnosis not present

## 2018-03-14 DIAGNOSIS — E669 Obesity, unspecified: Secondary | ICD-10-CM | POA: Diagnosis not present

## 2018-03-14 DIAGNOSIS — E559 Vitamin D deficiency, unspecified: Secondary | ICD-10-CM | POA: Diagnosis not present

## 2018-03-14 DIAGNOSIS — E890 Postprocedural hypothyroidism: Secondary | ICD-10-CM | POA: Diagnosis not present

## 2018-03-14 DIAGNOSIS — C50412 Malignant neoplasm of upper-outer quadrant of left female breast: Secondary | ICD-10-CM | POA: Diagnosis not present

## 2018-03-15 ENCOUNTER — Ambulatory Visit (AMBULATORY_SURGERY_CENTER): Payer: 59 | Admitting: Internal Medicine

## 2018-03-15 ENCOUNTER — Encounter: Payer: 59 | Admitting: Internal Medicine

## 2018-03-15 ENCOUNTER — Encounter: Payer: Self-pay | Admitting: Internal Medicine

## 2018-03-15 ENCOUNTER — Ambulatory Visit: Payer: 59

## 2018-03-15 ENCOUNTER — Ambulatory Visit
Admission: RE | Admit: 2018-03-15 | Discharge: 2018-03-15 | Disposition: A | Discharge status: Home / Self Care | Payer: 59 | Source: Ambulatory Visit | Attending: Radiation Oncology | Admitting: Radiation Oncology

## 2018-03-15 ENCOUNTER — Encounter: Payer: Self-pay | Admitting: Radiation Oncology

## 2018-03-15 VITALS — BP 128/80 | HR 66 | Temp 97.8°F | Resp 14 | Ht 66.0 in | Wt 202.0 lb

## 2018-03-15 DIAGNOSIS — C50412 Malignant neoplasm of upper-outer quadrant of left female breast: Secondary | ICD-10-CM | POA: Diagnosis not present

## 2018-03-15 DIAGNOSIS — Z8371 Family history of colonic polyps: Secondary | ICD-10-CM

## 2018-03-15 DIAGNOSIS — Z1211 Encounter for screening for malignant neoplasm of colon: Secondary | ICD-10-CM | POA: Diagnosis not present

## 2018-03-15 DIAGNOSIS — Z86010 Personal history of colonic polyps: Secondary | ICD-10-CM | POA: Diagnosis not present

## 2018-03-15 DIAGNOSIS — D369 Benign neoplasm, unspecified site: Secondary | ICD-10-CM

## 2018-03-15 MED ORDER — SODIUM CHLORIDE 0.9 % IV SOLN: 500.0000 mL | Freq: Once | INTRAVENOUS | Status: DC

## 2018-03-15 NOTE — Progress Notes (Signed)
Pt's states no medical or surgical changes since previsit or office visit. 

## 2018-03-15 NOTE — Op Note (Signed)
[facility] Patient Name: Bethany [name] Procedure Date: 03/15/2018 12:32 PM MRN: [id number] Endoscopist: Docia Chuck. Henrene Pastor , MD Age: 57 Referring MD:  Date of Birth: Aug 06, 1960 Gender: Female Account #: [id number] Procedure:                Colonoscopy Indications:              High risk colon cancer surveillance: Personal                            history of multiple (3 or more) adenomas. Previous                            examination December 2016 Medicines:                Monitored Anesthesia Care Procedure:                Pre-Anesthesia Assessment:                           - Prior to the procedure, a History and Physical                            was performed, and patient medications and                            allergies were reviewed. The patient's tolerance of                            previous anesthesia was also reviewed. The risks                            and benefits of the procedure and the sedation                            options and risks were discussed with the patient.                            All questions were answered, and informed consent                            was obtained. Prior Anticoagulants: The patient has                            taken no previous anticoagulant or antiplatelet                            agents. ASA Grade Assessment: II - A patient with                            mild systemic disease. After reviewing the risks                            and benefits, the patient was deemed in  satisfactory condition to undergo the procedure.                           After obtaining informed consent, the colonoscope                            was passed under direct vision. Throughout the                            procedure, the patient's blood pressure, pulse, and                            oxygen saturations were monitored continuously. The                            Colonoscope was introduced through  the anus and                            advanced to the the cecum, identified by                            appendiceal orifice and ileocecal valve. The                            ileocecal valve, appendiceal orifice, and rectum                            were photographed. The quality of the bowel                            preparation was excellent. The colonoscopy was                            performed without difficulty. The patient tolerated                            the procedure well. The bowel preparation used was                            SUPREP. Scope In: 12:41:42 PM Scope Out: 12:53:52 PM Scope Withdrawal Time: 0 hours 9 minutes 11 seconds  Total Procedure Duration: 0 hours 12 minutes 10 seconds  Findings:                 Multiple small and large-mouthed diverticula were                            found in the left colon and right colon.                           The exam was otherwise without abnormality on                            direct and retroflexion views. Complications:            No  immediate complications. Estimated blood loss:                            None. Estimated Blood Loss:     Estimated blood loss: none. Impression:               - Diverticulosis in the left colon and in the right                            colon.                           - The examination was otherwise normal on direct                            and retroflexion views.                           - No specimens collected. Recommendation:           - Repeat colonoscopy in 5 years for surveillance.                           - Patient has a contact number available for                            emergencies. The signs and symptoms of potential                            delayed complications were discussed with the                            patient. Return to normal activities tomorrow.                            Written discharge instructions were provided to the                             patient.                           - Resume previous diet.                           - Continue present medications. Docia Chuck. Henrene Pastor, MD 03/15/2018 12:59:39 PM This report has been signed electronically.

## 2018-03-15 NOTE — Patient Instructions (Signed)
Repeat colonoscopy in 5 years.   YOU HAD AN ENDOSCOPIC PROCEDURE TODAY AT [facility]:   Refer to the procedure report that was given to you for any specific questions about what was found during the examination.  If the procedure report does not answer your questions, please call your gastroenterologist to clarify.  If you requested that your care partner not be given the details of your procedure findings, then the procedure report has been included in a sealed envelope for you to review at your convenience later.  YOU SHOULD EXPECT: Some feelings of bloating in the abdomen. Passage of more gas than usual.  Walking can help get rid of the air that was put into your GI tract during the procedure and reduce the bloating. If you had a lower endoscopy (such as a colonoscopy or flexible sigmoidoscopy) you may notice spotting of blood in your stool or on the toilet paper. If you underwent a bowel prep for your procedure, you may not have a normal bowel movement for a few days.  Please Note:  You might notice some irritation and congestion in your nose or some drainage.  This is from the oxygen used during your procedure.  There is no need for concern and it should clear up in a day or so.  SYMPTOMS TO REPORT IMMEDIATELY:   Following lower endoscopy (colonoscopy or flexible sigmoidoscopy):  Excessive amounts of blood in the stool  Significant tenderness or worsening of abdominal pains  Swelling of the abdomen that is new, acute  Fever of 100F or higher   For urgent or emergent issues, a gastroenterologist can be reached at any hour by calling [phone].   DIET:  We do recommend a small meal at first, but then you may proceed to your regular diet.  Drink plenty of fluids but you should avoid alcoholic beverages for 24 hours.  ACTIVITY:  You should plan to take it easy for the rest of today and you should NOT DRIVE or use heavy machinery until tomorrow (because of the sedation  medicines used during the test).    FOLLOW UP: Our staff will call the number listed on your records the next business day following your procedure to check on you and address any questions or concerns that you may have regarding the information given to you following your procedure. If we do not reach you, we will leave a message.  However, if you are feeling well and you are not experiencing any problems, there is no need to return our call.  We will assume that you have returned to your regular daily activities without incident.  If any biopsies were taken you will be contacted by phone or by letter within the next 1-3 weeks.  Please call us at [phone] if you have not heard about the biopsies in 3 weeks.    SIGNATURES/CONFIDENTIALITY: You and/or your care partner have signed paperwork which will be entered into your electronic medical record.  These signatures attest to the fact that that the information above on your After Visit Summary has been reviewed and is understood.  Full responsibility of the confidentiality of this discharge information lies with you and/or your care-partner.

## 2018-03-15 NOTE — Progress Notes (Signed)
PT taken to PACU. Monitors in place. VSS. Report given to RN. 

## 2018-03-17 ENCOUNTER — Ambulatory Visit: Payer: 59

## 2018-03-17 ENCOUNTER — Ambulatory Visit: Admission: RE | Admit: 2018-03-17 | Payer: 59 | Source: Ambulatory Visit

## 2018-03-17 ENCOUNTER — Telehealth: Payer: Self-pay

## 2018-03-17 NOTE — Telephone Encounter (Signed)
Attempted to reach pt. With follow-up call following endoscopic procedure 03/15/2018.  Unable to reach pt. Or LM.    Will try to reach pt. Again later today.

## 2018-03-17 NOTE — Telephone Encounter (Signed)
  Follow up Call-  Call back number 03/15/2018  Post procedure Call Back phone  # [phone]  Permission to leave phone message Yes  Some recent data might be hidden     Left Message

## 2018-03-18 ENCOUNTER — Ambulatory Visit: Payer: 59

## 2018-03-23 ENCOUNTER — Other Ambulatory Visit: Payer: Self-pay

## 2018-03-23 DIAGNOSIS — L509 Urticaria, unspecified: Secondary | ICD-10-CM

## 2018-03-23 MED ORDER — CETIRIZINE HCL 10 MG PO TABS: 10.0000 mg | ORAL_TABLET | Freq: Every day | ORAL | 1 refills | Status: DC

## 2018-03-31 NOTE — Progress Notes (Signed)
  Radiation Oncology         [phone] ________________________________  Name: Bethany [name] MRN: [id number]  Date: 03/15/2018  DOB: 01-01-1961  End of Treatment Note  Diagnosis:   58 y.o. 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 This was delivered using a 3-D conformal technique. The patient then received a boost to the seroma. This delivered an additional 10 Gy in 5 fractions using 10X, 6X photons with a Complex Isodose technique. The total dose was 60.4 Gy.  Narrative: The patient tolerated radiation treatment relatively well.   The patient had some expected skin irritation with mild erythema and dry desquamation as she progressed during treatment. Moist desquamation was not present at the end of treatment. She is using Radiaplex and Neosporin. She also noted increased fatigue.  Plan: The patient has completed radiation treatment. The patient will return to radiation oncology clinic for routine followup in one month. I advised the patient to call or return sooner if they have any questions or concerns related to their recovery or treatment. ________________________________  Jodelle Gross, MD, PhD  This document serves as a record of services personally performed by Kyung Rudd, MD. It was created on his behalf by Rae Lips, a trained medical scribe. The creation of this record is based on the scribe's personal observations and the provider's statements to them. This document has been checked and approved by the attending provider.

## 2018-04-27 ENCOUNTER — Ambulatory Visit: Payer: 59 | Attending: Radiation Oncology | Admitting: Radiation Oncology

## 2018-05-16 ENCOUNTER — Encounter: Payer: 59 | Admitting: Obstetrics & Gynecology

## 2018-05-27 ENCOUNTER — Other Ambulatory Visit: Payer: Self-pay | Admitting: Obstetrics & Gynecology

## 2018-06-07 ENCOUNTER — Inpatient Hospital Stay: Payer: 59 | Admitting: Oncology

## 2018-06-07 ENCOUNTER — Inpatient Hospital Stay: Payer: 59

## 2018-06-21 ENCOUNTER — Other Ambulatory Visit: Payer: Self-pay

## 2018-06-22 ENCOUNTER — Ambulatory Visit (INDEPENDENT_AMBULATORY_CARE_PROVIDER_SITE_OTHER): Payer: 59 | Admitting: Obstetrics & Gynecology

## 2018-06-22 ENCOUNTER — Encounter: Payer: Self-pay | Admitting: Obstetrics & Gynecology

## 2018-06-22 VITALS — BP 136/84 | Ht 65.5 in | Wt 202.0 lb

## 2018-06-22 DIAGNOSIS — Z01419 Encounter for gynecological examination (general) (routine) without abnormal findings: Secondary | ICD-10-CM | POA: Diagnosis not present

## 2018-06-22 DIAGNOSIS — M8589 Other specified disorders of bone density and structure, multiple sites: Secondary | ICD-10-CM | POA: Diagnosis not present

## 2018-06-22 DIAGNOSIS — Z90710 Acquired absence of both cervix and uterus: Secondary | ICD-10-CM | POA: Diagnosis not present

## 2018-06-22 DIAGNOSIS — Z853 Personal history of malignant neoplasm of breast: Secondary | ICD-10-CM

## 2018-06-22 DIAGNOSIS — N951 Menopausal and female climacteric states: Secondary | ICD-10-CM | POA: Diagnosis not present

## 2018-06-22 DIAGNOSIS — E6609 Other obesity due to excess calories: Secondary | ICD-10-CM

## 2018-06-22 DIAGNOSIS — Z170 Estrogen receptor positive status [ER+]: Secondary | ICD-10-CM

## 2018-06-22 DIAGNOSIS — C50412 Malignant neoplasm of upper-outer quadrant of left female breast: Secondary | ICD-10-CM

## 2018-06-22 DIAGNOSIS — Z6833 Body mass index (BMI) 33.0-33.9, adult: Secondary | ICD-10-CM

## 2018-06-22 MED ORDER — PHENTERMINE HCL 37.5 MG PO CAPS: 37.5000 mg | ORAL_CAPSULE | ORAL | 0 refills | Status: DC

## 2018-06-22 MED ORDER — BUPROPION HCL ER (XL) 150 MG PO TB24: 150.0000 mg | ORAL_TABLET | Freq: Every day | ORAL | 4 refills | Status: DC

## 2018-06-22 NOTE — Progress Notes (Signed)
Bethany [name] 1961-01-27 [id number]   History:    58 y.o. G3P3L3 Married.  Grand-daughter almost 51 yo with Down Syndrome.  RP:  Established patient presenting for annual gyn exam   HPI: S/P Total Hysterectomy.  Menopause with continued severe hot flashes and night sweats.  No pelvic pain.  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  Recently went through radiation therapy.  Followed by Dr. Jana Hakim.  No pain with intercourse.  Urine and bowel movements normal.  Body mass index 33.10.  On a low-carb diet.  Mild to moderate physical activities.  Health labs with family physician.  Past medical history,surgical history, family history and social history were all reviewed and documented in the EPIC chart.  Gynecologic History No LMP recorded. Patient has had a hysterectomy. Contraception: status post hysterectomy Last Pap: 04/2017.  Results were: Negative Last mammogram: 10/2017. Results were: Abnormal Bone Density: 10/2017 Osteopenia Colonoscopy: 2019  Obstetric History OB History  Gravida Para Term Preterm AB Living  3 3 3     3   SAB TAB Ectopic Multiple Live Births               # Outcome Date GA Lbr Len/2nd Weight Sex Delivery Anes PTL Lv  3 Term           2 Term           1 Term              ROS: A ROS was performed and pertinent positives and negatives are included in the history.  GENERAL: No fevers or chills. HEENT: No change in vision, no earache, sore throat or sinus congestion. NECK: No pain or stiffness. CARDIOVASCULAR: No chest pain or pressure. No palpitations. PULMONARY: No shortness of breath, cough or wheeze. GASTROINTESTINAL: No abdominal pain, nausea, vomiting or diarrhea, melena or bright red blood per rectum. GENITOURINARY: No urinary frequency, urgency, hesitancy or dysuria. MUSCULOSKELETAL: No joint or muscle pain, no back pain, no recent trauma. DERMATOLOGIC: No rash, no itching, no lesions. ENDOCRINE: No polyuria, polydipsia, no heat or  cold intolerance. No recent change in weight. HEMATOLOGICAL: No anemia or easy bruising or bleeding. NEUROLOGIC: No headache, seizures, numbness, tingling or weakness. PSYCHIATRIC: No depression, no loss of interest in normal activity or change in sleep pattern.     Exam:   BP 136/84   Ht 5' 5.5" (1.664 m)   Wt 202 lb (91.6 kg)   BMI 33.10 kg/m   Body mass index is 33.1 kg/m.  General appearance : Well developed well nourished female. No acute distress HEENT: Eyes: no retinal hemorrhage or exudates,  Neck supple, trachea midline, no carotid bruits, no thyroidmegaly Lungs: Clear to auscultation, no rhonchi or wheezes, or rib retractions  Heart: Regular rate and rhythm, no murmurs or gallops Breast:Examined in sitting and supine position were symmetrical in appearance, no palpable masses or tenderness,  no skin retraction, no nipple inversion, no nipple discharge, no skin discoloration, no axillary or supraclavicular lymphadenopathy Abdomen: no palpable masses or tenderness, no rebound or guarding Extremities: no edema or skin discoloration or tenderness  Pelvic: Vulva: Normal             Vagina: No gross lesions or discharge  Cervix/Uterus Absent  Adnexa  Without masses or tenderness  Anus: Normal   Assessment/Plan:  58 y.o. female for annual exam   1. Well female exam with routine gynecological exam Gynecologic exam status post total hysterectomy and menopause.  Pap  test February 2019 was negative, no indication to repeat this year.  Colonoscopy in 2019.  Health labs with family physician.  2. S/P total hysterectomy  3. Menopausal syndrome Continued severe menopausal symptoms with hot flashes and night sweats.  Patient had left breast cancer with estrogen receptor positive.  Will start on Wellbutrin XL 150 mg 1 tablet daily.  Usage reviewed and prescription sent to pharmacy.  4. Osteopenia of multiple sites Osteopenia per bone density August 2019.  Vitamin D supplements,  calcium intake of 1200 to 1500 mg total daily and regular weightbearing physical activity is recommended.  5. Malignant neoplasm of upper-outer quadrant of left breast in female, estrogen receptor positive ([place]) Left breast cancer with recent radiation therapy.  Followed by Dr. Jana Hakim.  6. Class 1 obesity due to excess calories without serious comorbidity with body mass index (BMI) of 33.0 to 33.9 in adult Will start on phentermine for 3 months.  Usage known and blood pressure normal today.  Low calorie/carb diet such as [employer] recommended.  Intermittent fasting counseling done.  Aerobic physical activities 5 times a week and weightlifting every 2 days.  Other orders - hydrochlorothiazide (MICROZIDE) 12.5 MG capsule; Take 12.5 mg by mouth daily. - buPROPion (WELLBUTRIN XL) 150 MG 24 hr tablet; Take 1 tablet (150 mg total) by mouth daily. - phentermine 37.5 MG capsule; Take 1 capsule (37.5 mg total) by mouth every morning.  [name] MD, 11:41 AM 06/22/2018

## 2018-06-22 NOTE — Patient Instructions (Signed)
1. Well female exam with routine gynecological exam Gynecologic exam status post total hysterectomy and menopause.  Pap test February 2019 was negative, no indication to repeat this year.  Colonoscopy in 2019.  Health labs with family physician.  2. S/P total hysterectomy  3. Menopausal syndrome Continued severe menopausal symptoms with hot flashes and night sweats.  Patient had left breast cancer with estrogen receptor positive.  Will start on Wellbutrin XL 150 mg 1 tablet daily.  Usage reviewed and prescription sent to pharmacy.  4. Osteopenia of multiple sites Osteopenia per bone density August 2019.  Vitamin D supplements, calcium intake of 1200 to 1500 mg total daily and regular weightbearing physical activity is recommended.  5. Malignant neoplasm of upper-outer quadrant of left breast in female, estrogen receptor positive ([place]) Left breast cancer with recent radiation therapy.  Followed by Dr. Jana Hakim.  6. Class 1 obesity due to excess calories without serious comorbidity with body mass index (BMI) of 33.0 to 33.9 in adult Will start on phentermine for 3 months.  Usage known and blood pressure normal today.  Low calorie/carb diet such as [employer] recommended.  Intermittent fasting counseling done.  Aerobic physical activities 5 times a week and weightlifting every 2 days.  Other orders - hydrochlorothiazide (MICROZIDE) 12.5 MG capsule; Take 12.5 mg by mouth daily. - buPROPion (WELLBUTRIN XL) 150 MG 24 hr tablet; Take 1 tablet (150 mg total) by mouth daily. - phentermine 37.5 MG capsule; Take 1 capsule (37.5 mg total) by mouth every morning.  [name], it was a pleasure seeing you today!

## 2018-07-05 ENCOUNTER — Telehealth: Payer: Self-pay | Admitting: Oncology

## 2018-07-05 NOTE — Progress Notes (Signed)
[place]  [phone] [phone]     ID: Bethany [name] DOB: 1961-03-07  MR#: [id number]  HQP#:[id number]  Patient Care Team: Leeroy Cha, MD as PCP - General (Internal Medicine) Erroll Luna, MD as Consulting Physician (General Surgery) Magrinat, Virgie Dad, MD as Consulting Physician (Oncology) Kyung Rudd, MD as Consulting Physician (Radiation Oncology) Irene Shipper, MD as Consulting Physician (Gastroenterology) [name], MD as Consulting Physician (Obstetrics and Gynecology) Delrae Rend, MD as Consulting Physician (Endocrinology) OTHER MD:   CHIEF COMPLAINT: Estrogen receptor positive breast cancer  CURRENT TREATMENT: tamoxifen   I connected with Bethany [name] on 07/06/18 at  2:00 PM EDT by video enabled telemedicine visit and verified that I am speaking with the correct person using two identifiers.   I discussed the limitations, risks, security and privacy concerns of performing an evaluation and management service by telemedicine and the availability of in-person appointments. I also discussed with the patient that there may be a patient responsible charge related to this service. The patient expressed understanding and agreed to proceed.   Other persons participating in the visit and their role in the encounter:  - Biomedical scientist, Medical Scribe   Patient's location: home  Provider's location: [facility]   Chief Complaint: Estrogen receptor positive breast cancer    HISTORY OF CURRENT ILLNESS: The original intake note:  "Bethany [name]" had routine screening mammography on 10/26/2017 showing a possible abnormality in the left breast. She underwent unilateral left diagnostic mammography with tomography and left breast ultrasonography at [facility] on 10/23/2017 showing: breast density category B. There was a hypoechoic lesion consistent of a mass at the 2:30 o'clock upper outer quadrant middle  depth and measuring 0.5 x 0.3 x 0.4 cm. Sonographic evaluation of the left axilla shows no enlarged or abnormal lymph nodes.  An attempt was made to obtain a biopsy of this lesion on 11/05/2017, however the patient had repeated episodes of syncope during the attempted procedure.  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 Margins were negative for carcinoma. Prognostic indicators significant for: estrogen receptor, 90% positive and progesterone receptor, 80% positive, both with strong staining intensity. Proliferation marker Ki67 at 3%. HER2 negative with an immunohistochemistry of (1+).  Then in a separate procedure she underwent left sentinel lymph node sampling on 12/09/2017 showing ([id number]): Four left axillary sentinel lymph were negative for carcinoma. (0/4).   Her subsequent history is as detailed below.   INTERVAL HISTORY: Bethany [name] was seen today for follow-up and treatment of her estrogen receptor positive breast cancer.   She continues on tamoxifen. She notes that she has started the medication in the middle of 04/2018 for about two and a half weeks, but she began to feel a lot of side affects, so she stopped. She had fatigue, depression, headaches, shortness of breath, insomnia, and back aches.  She was a month out from finished radiation treatment. She began to feel better about two weeks later. Her mother talked her into starting the medication again. She notes that her back continues to hurt on or off the medication however. She has been treating her back with heating pads and stretching. She notes that she continues to have hot flashes, which she had before she began to take tamoxifen however.   Since her last visit here, she completed radiation on 03/15/2018.    She also underwent a colonoscopy on 03/15/2018 showing: Diverticulosis in  the left colon and in the right colon. The examination was  otherwise normal on direct and retroflexion views. No specimens collected.    REVIEW OF SYSTEMS: Bethany [name] has not been as active recently due to her shortness of breath and back pain. The patient denies unusual visual changes, nausea, vomiting, or dizziness. There has been no unusual cough, phlegm production, or pleurisy. This been no change in bowel or bladder habits. The patient denies unexplained weight loss, bleeding, rash, or fever. A detailed review of systems was otherwise noncontributory.    PAST MEDICAL HISTORY: Past Medical History:  Diagnosis Date  . Anxiety   . Breast mass, left 11/2017   going thru radiation until 02/2018  . Cancer [facility])    left breast cancer/diagnosed in 10/2017/taking radiation treatment until 03/15/18  . COPD (chronic obstructive pulmonary disease) ([place])    beginning stages/small scar  . Dental crowns present   . History of hiatal hernia    no current med.  . Hypertension    states under control with med., has been on med. x 1 yr.  . Hypothyroidism   . MVP (mitral valve prolapse)   . PONV (postoperative nausea and vomiting)   . Vitamin D deficiency   Thyroid cancer, GERD   PAST SURGICAL HISTORY: Past Surgical History:  Procedure Laterality Date  . ABDOMINAL HYSTERECTOMY     partial  . APPENDECTOMY    . AXILLARY SENTINEL NODE BIOPSY Left 12/09/2017   Procedure: AXILLARY SENTINEL NODE BIOPSY;  Surgeon: Erroll Luna, MD;  Location: [place];  Service: General;  Laterality: Left;  . BREAST LUMPECTOMY WITH NEEDLE LOCALIZATION Left 11/25/2017   Procedure: LEFT BREAST LUMPECTOMY WITH NEEDLE LOCALIZATION;  Surgeon: Erroll Luna, MD;  Location: [employer] [facility];  Service: General;  Laterality: Left;  . COLONOSCOPY WITH PROPOFOL  10/03/2015  . LAPAROSCOPIC APPENDECTOMY N/A 10/03/2015   Procedure: APPENDECTOMY LAPAROSCOPIC;  Surgeon: Rolm Bookbinder, MD;  Location: [place];  Service: General;  Laterality: N/A;  . TOTAL  THYROIDECTOMY  04/02/2003  Hysterectomy without BSO   FAMILY HISTORY Family History  Problem Relation Age of Onset  . Hypertension Father   . Heart disease Father   . Non-Hodgkin's lymphoma Father   . Hypertension Brother   . Heart disease Paternal Grandfather   . Colon cancer Other        great maternal aunt   . Colon polyps Mother   . Stomach cancer Neg Hx   . Rectal cancer Neg Hx   . Esophageal cancer Neg Hx    The patient's father died in April 18, 2017 age age 2 due to non-Hodgkin's lymphoma. The patient's mother is alive at age 39 as of October 2019. The patient has 1 brother, no sisters. There was a paternal aunt with lung cancer. There was a paternal grandmother and maternal grandfather with throat cancer. She denies a family history of breast or ovarian cancer.    GYNECOLOGIC HISTORY:  No LMP recorded. Patient has had a hysterectomy. Menarche: 58 years old Age at first live birth: 58 years old She is GX P3.  She is status post partial hysterectomy (without BSO) in her late 29's. She did not have HRT.    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 Her husband, Patrick Jupiter, is a Clinical cytogeneticist for [employer]. The patient's oldest son, [place], has 3 children and lives in [place] and works in Nurse, children's for [employer]. The patient's son, Alpha Gula lives in [place] with 1 daughter and works as  a Teacher, music. The patient's youngest son, Aline Brochure lives in [place] and works for [employer] in [place]. The patient has 4 grandchildren total. She does not currently belong to a church, but she is Psychologist, forensic.     ADVANCED DIRECTIVES:    HEALTH MAINTENANCE: Social History   Tobacco Use  . Smoking status: Former Smoker    Last attempt to quit: 08/14/2014    Years since quitting: 3.8  . Smokeless tobacco: Never Used  Substance Use Topics  . Alcohol use: Yes    Comment: rarely  . Drug use: No    Colonoscopy: 03/15/2018- to be repeated in 3 years under Dr. Henrene Pastor  PAP:   Bone  density: 10/25/2017 showed osteopenia   Allergies  Allergen Reactions  . Oxycodone Nausea And Vomiting  . Latex Rash  . Sulfa Antibiotics     hives    Current Outpatient Medications  Medication Sig Dispense Refill  . buPROPion (WELLBUTRIN XL) 150 MG 24 hr tablet Take 1 tablet (150 mg total) by mouth daily. 90 tablet 4  . cetirizine (ZYRTEC ALLERGY) 10 MG tablet Take 1 tablet (10 mg total) by mouth daily. 30 tablet 1  . ergocalciferol (VITAMIN D2) 50000 UNITS capsule Take 50,000 Units by mouth 2 (two) times a week. On Thurs & Sun    . hydrochlorothiazide (MICROZIDE) 12.5 MG capsule Take 12.5 mg by mouth daily.    Marland Kitchen levothyroxine (SYNTHROID, LEVOTHROID) 137 MCG tablet Take 150 mcg by mouth at bedtime.     Marland Kitchen losartan (COZAAR) 25 MG tablet Take 25 mg by mouth daily.    . phentermine 37.5 MG capsule Take 1 capsule (37.5 mg total) by mouth every morning. 90 capsule 0   Current Facility-Administered Medications  Medication Dose Route Frequency Provider Last Rate Last Dose  . 0.9 %  sodium chloride infusion  500 mL Intravenous Once Irene Shipper, MD        OBJECTIVE: Middle-aged white woman who appears stated age  There were no vitals filed for this visit.   There is no height or weight on file to calculate BMI.   Wt Readings from Last 3 Encounters:  06/22/18 202 lb (91.6 kg)  03/15/18 202 lb (91.6 kg)  03/02/18 202 lb 6.4 oz (91.8 kg)      ECOG FS:1 - Symptomatic but completely ambulatory   LAB RESULTS:  CMP     Component Value Date/Time   NA 141 03/01/2018 1358   K 4.2 03/01/2018 1358   CL 106 03/01/2018 1358   CO2 28 03/01/2018 1358   GLUCOSE 99 03/01/2018 1358   BUN 12 03/01/2018 1358   CREATININE 0.85 03/01/2018 1358   CREATININE 0.78 12/16/2017 1540   CALCIUM 8.7 (L) 03/01/2018 1358   PROT 6.7 03/01/2018 1358   ALBUMIN 3.8 03/01/2018 1358   AST 14 (L) 03/01/2018 1358   AST 12 (L) 12/16/2017 1540   ALT 20 03/01/2018 1358   ALT 16 12/16/2017 1540   ALKPHOS 77  03/01/2018 1358   BILITOT 0.4 03/01/2018 1358   BILITOT 0.4 12/16/2017 1540   GFRNONAA >60 03/01/2018 1358   GFRNONAA >60 12/16/2017 1540   GFRAA >60 03/01/2018 1358   GFRAA >60 12/16/2017 1540    No results found for: TOTALPROTELP, ALBUMINELP, A1GS, A2GS, BETS, BETA2SER, GAMS, MSPIKE, SPEI  No results found for: KPAFRELGTCHN, LAMBDASER, KAPLAMBRATIO  Lab Results  Component Value Date   WBC 5.7 03/01/2018   NEUTROABS 3.6 03/01/2018   HGB 12.3 03/01/2018   HCT 39.0 03/01/2018  MCV 93.3 03/01/2018   PLT 317 03/01/2018    '@LASTCHEMISTRY'$ @  No results found for: LABCA2  No components found for: [id number]  No results for input(s): INR in the last 168 hours.  No results found for: LABCA2  No results found for: IOE703  No results found for: JKK938  No results found for: HWE993  No results found for: CA2729  No components found for: HGQUANT  No results found for: CEA1 / No results found for: CEA1   No results found for: AFPTUMOR  No results found for: CHROMOGRNA  No results found for: PSA1  No visits with results within 3 Day(s) from this visit.  Latest known visit with results is:  Appointment on 03/01/2018  Component Date Value Ref Range Status  . Sodium 03/01/2018 141  135 - 145 mmol/L Final  . Potassium 03/01/2018 4.2  3.5 - 5.1 mmol/L Final  . Chloride 03/01/2018 106  98 - 111 mmol/L Final  . CO2 03/01/2018 28  22 - 32 mmol/L Final  . Glucose, Bld 03/01/2018 99  70 - 99 mg/dL Final  . BUN 03/01/2018 12  6 - 20 mg/dL Final  . Creatinine, Ser 03/01/2018 0.85  0.44 - 1.00 mg/dL Final  . Calcium 03/01/2018 8.7* 8.9 - 10.3 mg/dL Final  . Total Protein 03/01/2018 6.7  6.5 - 8.1 g/dL Final  . Albumin 03/01/2018 3.8  3.5 - 5.0 g/dL Final  . AST 03/01/2018 14* 15 - 41 U/L Final  . ALT 03/01/2018 20  0 - 44 U/L Final  . Alkaline Phosphatase 03/01/2018 77  38 - 126 U/L Final  . Total Bilirubin 03/01/2018 0.4  0.3 - 1.2 mg/dL Final  . GFR calc non Af Amer  03/01/2018 >60  >60 mL/min Final  . GFR calc Af Amer 03/01/2018 >60  >60 mL/min Final  . Anion gap 03/01/2018 7  5 - 15 Final   Performed at [facility], [place] [address] [place], [place] [address]  . WBC 03/01/2018 5.7  4.0 - 10.5 K/uL Final  . RBC 03/01/2018 4.18  3.87 - 5.11 MIL/uL Final  . Hemoglobin 03/01/2018 12.3  12.0 - 15.0 g/dL Final  . HCT 03/01/2018 39.0  36.0 - 46.0 % Final  . MCV 03/01/2018 93.3  80.0 - 100.0 fL Final  . MCH 03/01/2018 29.4  26.0 - 34.0 pg Final  . MCHC 03/01/2018 31.5  30.0 - 36.0 g/dL Final  . RDW 03/01/2018 13.2  11.5 - 15.5 % Final  . Platelets 03/01/2018 317  150 - 400 K/uL Final  . nRBC 03/01/2018 0.0  0.0 - 0.2 % Final  . Neutrophils Relative % 03/01/2018 64  % Final  . Neutro Abs 03/01/2018 3.6  1.7 - 7.7 K/uL Final  . Lymphocytes Relative 03/01/2018 25  % Final  . Lymphs Abs 03/01/2018 1.4  0.7 - 4.0 K/uL Final  . Monocytes Relative 03/01/2018 7  % Final  . Monocytes Absolute 03/01/2018 0.4  0.1 - 1.0 K/uL Final  . Eosinophils Relative 03/01/2018 3  % Final  . Eosinophils Absolute 03/01/2018 0.2  0.0 - 0.5 K/uL Final  . Basophils Relative 03/01/2018 1  % Final  . Basophils Absolute 03/01/2018 0.0  0.0 - 0.1 K/uL Final  . Immature Granulocytes 03/01/2018 0  % Final  . Abs Immature Granulocytes 03/01/2018 0.02  0.00 - 0.07 K/uL Final   Performed at [facility], [place] [address], [place], [place] [address]    (this displays  the last labs from the last 3 days)  No results found for: TOTALPROTELP, ALBUMINELP, A1GS, A2GS, BETS, BETA2SER, GAMS, MSPIKE, SPEI (this displays SPEP labs)  No results found for: KPAFRELGTCHN, LAMBDASER, KAPLAMBRATIO (kappa/lambda light chains)  No results found for: HGBA, HGBA2QUANT, HGBFQUANT, HGBSQUAN (Hemoglobinopathy evaluation)   No results found for: LDH  No results found for: IRON, TIBC, IRONPCTSAT (Iron and TIBC)  No results found for: FERRITIN  Urinalysis     Component Value Date/Time   COLORURINE YELLOW 10/02/2015 2022   APPEARANCEUR CLOUDY (A) 10/02/2015 2022   LABSPEC 1.020 10/02/2015 2022   PHURINE 6.5 10/02/2015 2022   GLUCOSEU NEGATIVE 10/02/2015 2022   HGBUR NEGATIVE 10/02/2015 2022   BILIRUBINUR NEGATIVE 10/02/2015 2022   KETONESUR 15 (A) 10/02/2015 2022   PROTEINUR NEGATIVE 10/02/2015 2022   UROBILINOGEN 0.2 09/20/2014 1536   NITRITE NEGATIVE 10/02/2015 2022   LEUKOCYTESUR SMALL (A) 10/02/2015 2022     STUDIES: No results found.   ELIGIBLE FOR AVAILABLE RESEARCH PROTOCOL: No   ASSESSMENT: 58 y.o. [name], [place] woman status post left breast upper outer quadrant lumpectomy 11/25/2017 for a pT1b pNX, stage Ia invasive ductal carcinoma, grade 1, estrogen and progesterone receptor positive, HER-2/neu negative, with an MIB-1 of 3%  (1) status post left axillary lymph node sampling 12/09/2017, all 4 sentinel lymph nodes clear  (2) Oncotype score of 24 predicts a 10-year risk of recurrence outside the breast over the next 9 years if the patient's only systemic therapy is an estrogens for 5 years.  It also predicts no benefit from chemotherapy  (3) adjuvant radiation to be completed 03/15/2018  (4) started tamoxifen 04/16/2018   PLAN: Bethany [name] is having a rough time getting into tamoxifen.  Some of the side effects that she is experiencing are indeed due to the drug.  Dynamization is to say that had headaches initially.  This usually clears up to 3 weeks and does not recur.  She is having hot flashes.  However she was having hot flashes previously before.  It is not clearly hot flashes are worse than they used to be.  The back pain that she is experiencing is not going to be related to the tamoxifen which if anything helps for about.  It is likely to be arthritis.  She had started a full course of exercise my patients when they resume anterior than cells because they are so motivated.  I suggested she take it easy, be very  gentle with her body, do a lot of stretching and warm, and then some cardio, particularly walking.  She does have good shoes.  Luckily she has not had leg cramps which many of my patients do experience when they start tamoxifen.  Not also clears after a while.  I am going to see her again hopefully in person in a couple of months.  If she is tolerating tamoxifen well at that time the plan will be for 5 years on that medication.  We will broaden the follow-up interval at that point.  Magrinat, Virgie Dad, MD  07/06/18 2:12 PM Medical Oncology and Hematology [facility] [address] [place], [place] [address] Tel. [phone]    Fax. [phone]  I, Jacqualyn Posey am acting as a Education administrator for Chauncey Cruel, MD.   I, Lurline Del MD, have reviewed the above documentation for accuracy and completeness, and I agree with the above.

## 2018-07-05 NOTE — Telephone Encounter (Signed)
Called patient regarding upcoming Webex appointment, patient is notified and e-mail has been sent. °

## 2018-07-06 ENCOUNTER — Inpatient Hospital Stay: Payer: 59 | Attending: Oncology | Admitting: Oncology

## 2018-07-06 ENCOUNTER — Encounter: Payer: Self-pay | Admitting: *Deleted

## 2018-07-06 ENCOUNTER — Other Ambulatory Visit: Payer: 59

## 2018-07-06 DIAGNOSIS — Z923 Personal history of irradiation: Secondary | ICD-10-CM

## 2018-07-06 DIAGNOSIS — C50412 Malignant neoplasm of upper-outer quadrant of left female breast: Secondary | ICD-10-CM

## 2018-07-06 DIAGNOSIS — Z170 Estrogen receptor positive status [ER+]: Secondary | ICD-10-CM | POA: Diagnosis not present

## 2018-07-06 DIAGNOSIS — Z79811 Long term (current) use of aromatase inhibitors: Secondary | ICD-10-CM | POA: Diagnosis not present

## 2018-07-07 ENCOUNTER — Telehealth: Payer: Self-pay | Admitting: Adult Health

## 2018-07-07 NOTE — Telephone Encounter (Signed)
Scheduled SCP per sch msg. Scheduled for august due to no availability in July. Mailed printout

## 2018-07-12 ENCOUNTER — Telehealth: Payer: Self-pay | Admitting: *Deleted

## 2018-07-12 MED ORDER — VENLAFAXINE HCL ER 37.5 MG PO CP24: 37.5000 mg | ORAL_CAPSULE | Freq: Every day | ORAL | 0 refills | Status: DC

## 2018-07-12 NOTE — Telephone Encounter (Signed)
This RN spoke with per her return call.  Discussed anti depressant therapy with noted medication on list of Wellbutrin, with Bethany [name] stating she has not started this medication.  " when I was seen by my ob/gyn we discussed my issues too- and I had told her I had been on this drug in the past and I think she thought I would tolerate it well but it really didn't help me and I am concerned it could interact with the tamoxifen"  Per above - discussed use of effexor which may be beneficial as an anti depressant but also effective for hot flashes secondary to tamoxifen side effects.  Use of low dose to start discussed and then to increase post 2 weeks if needed.  Bethany [name] verbalized understanding.  No other needs by the patient.  This RN contacted pt's pharmacy and spoke with Truman Hayward- requested discontinuation of wellbutrin. Truman Hayward verbalized understanding of medication discontinuation.

## 2018-07-12 NOTE — Telephone Encounter (Signed)
Pt left VM stating she forgot to ask per virtual visit " I really need an antidepressant "   " I have tried 2 different ones - Wellbutrin and Lexapro and neither was strong enough so maybe Effexor or Prozac "  Return call number given as [phone].  Noted pt has Wellbutrin on med list prescribed by Dr Dellis Filbert with recent date of 06/22/2018.  This RN returned call and obtained identified VM but VM full and unable to leave message.

## 2018-08-06 ENCOUNTER — Other Ambulatory Visit: Payer: Self-pay | Admitting: Oncology

## 2018-09-03 ENCOUNTER — Other Ambulatory Visit: Payer: Self-pay | Admitting: Oncology

## 2018-09-13 NOTE — Progress Notes (Signed)
No show

## 2018-09-14 ENCOUNTER — Encounter: Payer: Self-pay | Admitting: Oncology

## 2018-09-14 ENCOUNTER — Inpatient Hospital Stay: Payer: 59

## 2018-09-14 ENCOUNTER — Inpatient Hospital Stay: Payer: 59 | Attending: Oncology | Admitting: Oncology

## 2018-09-14 DIAGNOSIS — Z79810 Long term (current) use of selective estrogen receptor modulators (SERMs): Secondary | ICD-10-CM | POA: Insufficient documentation

## 2018-09-14 DIAGNOSIS — Z170 Estrogen receptor positive status [ER+]: Secondary | ICD-10-CM | POA: Insufficient documentation

## 2018-09-14 DIAGNOSIS — F419 Anxiety disorder, unspecified: Secondary | ICD-10-CM | POA: Insufficient documentation

## 2018-09-14 DIAGNOSIS — Z800 Family history of malignant neoplasm of digestive organs: Secondary | ICD-10-CM | POA: Insufficient documentation

## 2018-09-14 DIAGNOSIS — C50412 Malignant neoplasm of upper-outer quadrant of left female breast: Secondary | ICD-10-CM | POA: Insufficient documentation

## 2018-09-14 DIAGNOSIS — E039 Hypothyroidism, unspecified: Secondary | ICD-10-CM | POA: Insufficient documentation

## 2018-09-14 DIAGNOSIS — Z79899 Other long term (current) drug therapy: Secondary | ICD-10-CM | POA: Insufficient documentation

## 2018-09-14 DIAGNOSIS — R232 Flushing: Secondary | ICD-10-CM | POA: Insufficient documentation

## 2018-09-14 DIAGNOSIS — R0602 Shortness of breath: Secondary | ICD-10-CM | POA: Insufficient documentation

## 2018-09-14 DIAGNOSIS — R252 Cramp and spasm: Secondary | ICD-10-CM | POA: Insufficient documentation

## 2018-09-14 DIAGNOSIS — E559 Vitamin D deficiency, unspecified: Secondary | ICD-10-CM | POA: Insufficient documentation

## 2018-09-14 DIAGNOSIS — J449 Chronic obstructive pulmonary disease, unspecified: Secondary | ICD-10-CM | POA: Insufficient documentation

## 2018-09-14 DIAGNOSIS — Z9181 History of falling: Secondary | ICD-10-CM | POA: Insufficient documentation

## 2018-09-14 DIAGNOSIS — Z87891 Personal history of nicotine dependence: Secondary | ICD-10-CM | POA: Insufficient documentation

## 2018-09-14 DIAGNOSIS — I10 Essential (primary) hypertension: Secondary | ICD-10-CM | POA: Insufficient documentation

## 2018-09-14 DIAGNOSIS — K449 Diaphragmatic hernia without obstruction or gangrene: Secondary | ICD-10-CM | POA: Insufficient documentation

## 2018-09-21 ENCOUNTER — Telehealth: Payer: Self-pay | Admitting: Oncology

## 2018-09-21 ENCOUNTER — Ambulatory Visit: Payer: 59 | Admitting: Obstetrics & Gynecology

## 2018-09-21 NOTE — Telephone Encounter (Signed)
Called pt per 7/07 sch message - no answer and no vmail on mobile . vmail full   Message left on home number for patient to call back to reschedule appt from 7/01.

## 2018-09-30 ENCOUNTER — Other Ambulatory Visit: Payer: Self-pay | Admitting: Oncology

## 2018-09-30 NOTE — Telephone Encounter (Signed)
May patient receive 90-day supply (180 pills) with next refill?  [facility] reports quantity of sixty pills were dispensed 09-04-2018.

## 2018-10-06 ENCOUNTER — Telehealth: Payer: Self-pay | Admitting: Oncology

## 2018-10-06 NOTE — Telephone Encounter (Signed)
Called pt per 7/22 sch message - no answer. Left message for patient to call back to reschedule.

## 2018-10-10 ENCOUNTER — Other Ambulatory Visit: Payer: Self-pay

## 2018-10-10 ENCOUNTER — Inpatient Hospital Stay: Payer: 59 | Admitting: Adult Health

## 2018-10-11 ENCOUNTER — Inpatient Hospital Stay (HOSPITAL_BASED_OUTPATIENT_CLINIC_OR_DEPARTMENT_OTHER): Payer: 59 | Admitting: Oncology

## 2018-10-11 ENCOUNTER — Inpatient Hospital Stay: Payer: 59

## 2018-10-11 ENCOUNTER — Other Ambulatory Visit: Payer: Self-pay

## 2018-10-11 VITALS — BP 146/77 | HR 74 | Temp 99.1°F | Resp 16 | Ht 65.5 in | Wt 187.4 lb

## 2018-10-11 DIAGNOSIS — R0602 Shortness of breath: Secondary | ICD-10-CM

## 2018-10-11 DIAGNOSIS — R252 Cramp and spasm: Secondary | ICD-10-CM | POA: Diagnosis not present

## 2018-10-11 DIAGNOSIS — E559 Vitamin D deficiency, unspecified: Secondary | ICD-10-CM

## 2018-10-11 DIAGNOSIS — R232 Flushing: Secondary | ICD-10-CM | POA: Diagnosis not present

## 2018-10-11 DIAGNOSIS — Z79899 Other long term (current) drug therapy: Secondary | ICD-10-CM

## 2018-10-11 DIAGNOSIS — E039 Hypothyroidism, unspecified: Secondary | ICD-10-CM | POA: Diagnosis not present

## 2018-10-11 DIAGNOSIS — I10 Essential (primary) hypertension: Secondary | ICD-10-CM | POA: Diagnosis not present

## 2018-10-11 DIAGNOSIS — K449 Diaphragmatic hernia without obstruction or gangrene: Secondary | ICD-10-CM | POA: Diagnosis not present

## 2018-10-11 DIAGNOSIS — Z87891 Personal history of nicotine dependence: Secondary | ICD-10-CM

## 2018-10-11 DIAGNOSIS — F419 Anxiety disorder, unspecified: Secondary | ICD-10-CM | POA: Diagnosis not present

## 2018-10-11 DIAGNOSIS — J449 Chronic obstructive pulmonary disease, unspecified: Secondary | ICD-10-CM | POA: Diagnosis not present

## 2018-10-11 DIAGNOSIS — Z170 Estrogen receptor positive status [ER+]: Secondary | ICD-10-CM

## 2018-10-11 DIAGNOSIS — Z800 Family history of malignant neoplasm of digestive organs: Secondary | ICD-10-CM

## 2018-10-11 DIAGNOSIS — C50412 Malignant neoplasm of upper-outer quadrant of left female breast: Secondary | ICD-10-CM

## 2018-10-11 DIAGNOSIS — Z9181 History of falling: Secondary | ICD-10-CM

## 2018-10-11 DIAGNOSIS — Z79810 Long term (current) use of selective estrogen receptor modulators (SERMs): Secondary | ICD-10-CM | POA: Diagnosis not present

## 2018-10-11 LAB — COMPREHENSIVE METABOLIC PANEL
ALT: 20 U/L (ref 0–44)
AST: 16 U/L (ref 15–41)
Albumin: 3.6 g/dL (ref 3.5–5.0)
Alkaline Phosphatase: 71 U/L (ref 38–126)
Anion gap: 7 (ref 5–15)
BUN: 10 mg/dL (ref 6–20)
CO2: 27 mmol/L (ref 22–32)
Calcium: 8 mg/dL — ABNORMAL LOW (ref 8.9–10.3)
Chloride: 107 mmol/L (ref 98–111)
Creatinine, Ser: 0.9 mg/dL (ref 0.44–1.00)
GFR calc Af Amer: >60 mL/min (ref >60)
GFR calc non Af Amer: >60 mL/min (ref >60)
Glucose, Bld: 92 mg/dL (ref 70–99)
Potassium: 4.2 mmol/L (ref 3.5–5.1)
Sodium: 141 mmol/L (ref 135–145)
Total Bilirubin: 0.3 mg/dL (ref 0.3–1.2)
Total Protein: 6.7 g/dL (ref 6.5–8.1)

## 2018-10-11 LAB — CBC WITH DIFFERENTIAL/PLATELET
Abs Immature Granulocytes: 0.01 10*3/uL (ref 0.00–0.07)
Basophils Absolute: 0 10*3/uL (ref 0.0–0.1)
Basophils Relative: 0 %
Eosinophils Absolute: 0.2 10*3/uL (ref 0.0–0.5)
Eosinophils Relative: 4 %
HCT: 39.8 % (ref 36.0–46.0)
Hemoglobin: 12.9 g/dL (ref 12.0–15.0)
Immature Granulocytes: 0 %
Lymphocytes Relative: 33 %
Lymphs Abs: 1.6 10*3/uL (ref 0.7–4.0)
MCH: 29.8 pg (ref 26.0–34.0)
MCHC: 32.4 g/dL (ref 30.0–36.0)
MCV: 91.9 fL (ref 80.0–100.0)
Monocytes Absolute: 0.3 10*3/uL (ref 0.1–1.0)
Monocytes Relative: 7 %
Neutro Abs: 2.7 10*3/uL (ref 1.7–7.7)
Neutrophils Relative %: 56 %
Platelets: 271 10*3/uL (ref 150–400)
RBC: 4.33 MIL/uL (ref 3.87–5.11)
RDW: 13.3 % (ref 11.5–15.5)
WBC: 4.9 10*3/uL (ref 4.0–10.5)
nRBC: 0 % (ref 0.0–0.2)

## 2018-10-11 MED ORDER — TAMOXIFEN CITRATE 10 MG PO TABS: 10.0000 mg | ORAL_TABLET | Freq: Two times a day (BID) | ORAL | 4 refills | Status: DC

## 2018-10-11 NOTE — Progress Notes (Signed)
[place]  [phone] [phone]     ID: Bethany [name] DOB: 1960-06-16  MR#: [id number]  EXN#:[id number]  Patient Care Team: Leeroy Cha, MD as PCP - General (Internal Medicine) Erroll Luna, MD as Consulting Physician (General Surgery) Magrinat, Virgie Dad, MD as Consulting Physician (Oncology) Kyung Rudd, MD as Consulting Physician (Radiation Oncology) Irene Shipper, MD as Consulting Physician (Gastroenterology) [name], MD as Consulting Physician (Obstetrics and Gynecology) Delrae Rend, MD as Consulting Physician (Endocrinology) Mauro Kaufmann, RN as Oncology Nurse Navigator Rockwell Germany, RN as Oncology Nurse Navigator OTHER MD:   CHIEF COMPLAINT: Estrogen receptor positive breast cancer  CURRENT TREATMENT: tamoxifen at 10 mg/ d   INTERVAL HISTORY: Bethany [name] was seen today for follow-up and treatment of her estrogen receptor positive breast cancer.   She continues on tamoxifen. She states she felt sick taking a whole pill, so she began to cut them in half. She reports she has tolerated the half pill daily much better. She reports leg cramps at night.  She is still having hot flashes even at the lower dose.  Since her last visit, she has not undergone any additional studies. Her last mammogram was on 10/26/2017 at [facility].   REVIEW OF SYSTEMS: Stefannie reports she feels more anxious lately. She also reports shortness of breath. She denies wheezing, cough, and fever, just feels like she can't take a full breath at times.  She has also fallen several times.  She fell 3 times with the same set of 3 steps.  One time she was wearing different shoes but the other time she was not.  There have also been a couple of falls at home.  She really does not have a good explanation for her balance issues.  She states she takes a sleep aid. At home is her, her husband, and their two dogs. Her grandchildren are ages 23, 17, 38,  18, and 3. She notes the youngest has Down Syndrome and recently turned 3. A detailed review of systems was otherwise noncontributory.     HISTORY OF CURRENT ILLNESS: The original intake note:  "Bethany [name]" had routine screening mammography on 10/26/2017 showing a possible abnormality in the left breast. She underwent unilateral left diagnostic mammography with tomography and left breast ultrasonography at [facility] on 10/31/2017 showing: breast density category B. There was a hypoechoic lesion consistent of a mass at the 2:30 o'clock upper outer quadrant middle depth and measuring 0.5 x 0.3 x 0.4 cm. Sonographic evaluation of the left axilla shows no enlarged or abnormal lymph nodes.  An attempt was made to obtain a biopsy of this lesion on 11/11/2017, however the patient had repeated episodes of syncope during the attempted procedure.  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 Margins were negative for carcinoma. Prognostic indicators significant for: estrogen receptor, 90% positive and progesterone receptor, 80% positive, both with strong staining intensity. Proliferation marker Ki67 at 3%. HER2 negative with an immunohistochemistry of (1+).  Then in a separate procedure she underwent left sentinel lymph node sampling on 12/09/2017 showing ([id number]): Four left axillary sentinel lymph were negative for carcinoma. (0/4).   Her subsequent history is as detailed below.   PAST MEDICAL HISTORY: Past Medical History:  Diagnosis Date  . Anxiety   . Breast mass, left 11/2017   going thru radiation until 02/2018  . Cancer ([place])    left breast cancer/diagnosed in  10/2017/taking radiation treatment until 03/15/18  . COPD (chronic obstructive pulmonary disease) ([place])    beginning stages/small scar  . Dental crowns present   . History of hiatal hernia    no current med.  . Hypertension    states under  control with med., has been on med. x 1 yr.  . Hypothyroidism   . MVP (mitral valve prolapse)   . PONV (postoperative nausea and vomiting)   . Vitamin D deficiency   Thyroid cancer, GERD   PAST SURGICAL HISTORY: Past Surgical History:  Procedure Laterality Date  . ABDOMINAL HYSTERECTOMY     partial  . APPENDECTOMY    . AXILLARY SENTINEL NODE BIOPSY Left 12/09/2017   Procedure: AXILLARY SENTINEL NODE BIOPSY;  Surgeon: Erroll Luna, MD;  Location: [place];  Service: General;  Laterality: Left;  . BREAST LUMPECTOMY WITH NEEDLE LOCALIZATION Left 11/25/2017   Procedure: LEFT BREAST LUMPECTOMY WITH NEEDLE LOCALIZATION;  Surgeon: Erroll Luna, MD;  Location: [facility];  Service: General;  Laterality: Left;  . COLONOSCOPY WITH PROPOFOL  10/03/2015  . LAPAROSCOPIC APPENDECTOMY N/A 10/03/2015   Procedure: APPENDECTOMY LAPAROSCOPIC;  Surgeon: Rolm Bookbinder, MD;  Location: [facility];  Service: General;  Laterality: N/A;  . TOTAL THYROIDECTOMY  04/02/2003  Hysterectomy without BSO   FAMILY HISTORY Family History  Problem Relation Age of Onset  . Hypertension Father   . Heart disease Father   . Non-Hodgkin's lymphoma Father   . Hypertension Brother   . Heart disease Paternal Grandfather   . Colon cancer Other        great maternal aunt   . Colon polyps Mother   . Stomach cancer Neg Hx   . Rectal cancer Neg Hx   . Esophageal cancer Neg Hx    The patient's father died in 04-11-17 age age 40 due to non-Hodgkin's lymphoma. The patient's mother is alive at age 6 as of October 2019. The patient has 1 brother, no sisters. There was a paternal aunt with lung cancer. There was a paternal grandmother and maternal grandfather with throat cancer. She denies a family history of breast or ovarian cancer.    GYNECOLOGIC HISTORY:  No LMP recorded. Patient has had a hysterectomy. Menarche: 58 years old Age at first live birth: 58 years old She is GX P3.  She  is status post partial hysterectomy (without BSO) in her late 7's. She did not have HRT.    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 Her husband, Patrick Jupiter, is a Clinical cytogeneticist for [employer]. The patient's oldest son, Washington, has 3 children and lives in [place] and works in Nurse, children's for [employer]. The patient's son, Alpha Gula lives in [place] with 1 daughter and works as a Teacher, music. The patient's youngest son, Aline Brochure lives in [place] and works for [employer] in [place]. The patient has 5 grandchildren total. She does not currently belong to a church, but she is Psychologist, forensic.     ADVANCED DIRECTIVES:    HEALTH MAINTENANCE: Social History   Tobacco Use  . Smoking status: Former Smoker    Quit date: 08/14/2014    Years since quitting: 4.1  . Smokeless tobacco: Never Used  Substance Use Topics  . Alcohol use: Yes    Comment: rarely  . Drug use: No    Colonoscopy: 03/15/2018- to be repeated in 3 years under Dr. Henrene Pastor  PAP:   Bone density: 10/25/2017 showed osteopenia   Allergies  Allergen Reactions  . Oxycodone Nausea And  Vomiting  . Latex Rash  . Sulfa Antibiotics     hives    Current Outpatient Medications  Medication Sig Dispense Refill  . buPROPion (WELLBUTRIN XL) 150 MG 24 hr tablet Take 1 tablet (150 mg total) by mouth daily. 90 tablet 4  . cetirizine (ZYRTEC ALLERGY) 10 MG tablet Take 1 tablet (10 mg total) by mouth daily. 30 tablet 1  . ergocalciferol (VITAMIN D2) 50000 UNITS capsule Take 50,000 Units by mouth 2 (two) times a week. On Thurs & Sun    . hydrochlorothiazide (MICROZIDE) 12.5 MG capsule Take 12.5 mg by mouth daily.    Marland Kitchen levothyroxine (SYNTHROID, LEVOTHROID) 137 MCG tablet Take 150 mcg by mouth at bedtime.     Marland Kitchen losartan (COZAAR) 25 MG tablet Take 25 mg by mouth daily.    . phentermine 37.5 MG capsule Take 1 capsule (37.5 mg total) by mouth every morning. 90 capsule 0  . venlafaxine XR (EFFEXOR-XR) 37.5 MG 24 hr capsule TAKE 1 CAPSULE BY MOUTH DAILY  WITH BREAKFAST FOR 2 WEEKS & THEN MAY INCREASE TO 2 CAPSULES DAILY 180 capsule 2   Current Facility-Administered Medications  Medication Dose Route Frequency Provider Last Rate Last Dose  . 0.9 %  sodium chloride infusion  500 mL Intravenous Once Irene Shipper, MD        OBJECTIVE: Middle-aged white woman in no acute distress  There were no vitals filed for this visit.   There is no height or weight on file to calculate BMI.   Wt Readings from Last 3 Encounters:  06/22/18 202 lb (91.6 kg)  03/15/18 202 lb (91.6 kg)  03/02/18 202 lb 6.4 oz (91.8 kg)      ECOG FS:1 - Symptomatic but completely ambulatory  Sclerae unicteric, EOMs intact Wearing a mask No cervical or supraclavicular adenopathy Lungs no rales or rhonchi Heart regular rate and rhythm Abd soft, nontender, positive bowel sounds MSK no focal spinal tenderness, no upper extremity lymphedema Neuro: nonfocal, well oriented, appropriate affect Breasts: The right breast is unremarkable.  The left breast is status post lumpectomy and radiation.  There is no evidence of local recurrence.  Both axillae are benign.  LAB RESULTS:  CMP     Component Value Date/Time   NA 141 03/01/2018 1358   K 4.2 03/01/2018 1358   CL 106 03/01/2018 1358   CO2 28 03/01/2018 1358   GLUCOSE 99 03/01/2018 1358   BUN 12 03/01/2018 1358   CREATININE 0.85 03/01/2018 1358   CREATININE 0.78 12/16/2017 1540   CALCIUM 8.7 (L) 03/01/2018 1358   PROT 6.7 03/01/2018 1358   ALBUMIN 3.8 03/01/2018 1358   AST 14 (L) 03/01/2018 1358   AST 12 (L) 12/16/2017 1540   ALT 20 03/01/2018 1358   ALT 16 12/16/2017 1540   ALKPHOS 77 03/01/2018 1358   BILITOT 0.4 03/01/2018 1358   BILITOT 0.4 12/16/2017 1540   GFRNONAA >60 03/01/2018 1358   GFRNONAA >60 12/16/2017 1540   GFRAA >60 03/01/2018 1358   GFRAA >60 12/16/2017 1540    No results found for: TOTALPROTELP, ALBUMINELP, A1GS, A2GS, BETS, BETA2SER, GAMS, MSPIKE, SPEI  No results found for:  KPAFRELGTCHN, LAMBDASER, KAPLAMBRATIO  Lab Results  Component Value Date   WBC 5.7 03/01/2018   NEUTROABS 3.6 03/01/2018   HGB 12.3 03/01/2018   HCT 39.0 03/01/2018   MCV 93.3 03/01/2018   PLT 317 03/01/2018    '@LASTCHEMISTRY'$ @  No results found for: LABCA2  No components found for: QMGNOI370  No  results for input(s): INR in the last 168 hours.  No results found for: LABCA2  No results found for: BTD176  No results found for: HYW737  No results found for: TGG269  No results found for: CA2729  No components found for: HGQUANT  No results found for: CEA1 / No results found for: CEA1   No results found for: AFPTUMOR  No results found for: CHROMOGRNA  No results found for: PSA1  No visits with results within 3 Day(s) from this visit.  Latest known visit with results is:  Appointment on 03/01/2018  Component Date Value Ref Range Status  . Sodium 03/01/2018 141  135 - 145 mmol/L Final  . Potassium 03/01/2018 4.2  3.5 - 5.1 mmol/L Final  . Chloride 03/01/2018 106  98 - 111 mmol/L Final  . CO2 03/01/2018 28  22 - 32 mmol/L Final  . Glucose, Bld 03/01/2018 99  70 - 99 mg/dL Final  . BUN 03/01/2018 12  6 - 20 mg/dL Final  . Creatinine, Ser 03/01/2018 0.85  0.44 - 1.00 mg/dL Final  . Calcium 03/01/2018 8.7* 8.9 - 10.3 mg/dL Final  . Total Protein 03/01/2018 6.7  6.5 - 8.1 g/dL Final  . Albumin 03/01/2018 3.8  3.5 - 5.0 g/dL Final  . AST 03/01/2018 14* 15 - 41 U/L Final  . ALT 03/01/2018 20  0 - 44 U/L Final  . Alkaline Phosphatase 03/01/2018 77  38 - 126 U/L Final  . Total Bilirubin 03/01/2018 0.4  0.3 - 1.2 mg/dL Final  . GFR calc non Af Amer 03/01/2018 >60  >60 mL/min Final  . GFR calc Af Amer 03/01/2018 >60  >60 mL/min Final  . Anion gap 03/01/2018 7  5 - 15 Final   Performed at [facility], [place] [address] [place], [place] [address]  . WBC 03/01/2018 5.7  4.0 - 10.5 K/uL Final  . RBC 03/01/2018 4.18  3.87 - 5.11 MIL/uL Final  .  Hemoglobin 03/01/2018 12.3  12.0 - 15.0 g/dL Final  . HCT 03/01/2018 39.0  36.0 - 46.0 % Final  . MCV 03/01/2018 93.3  80.0 - 100.0 fL Final  . MCH 03/01/2018 29.4  26.0 - 34.0 pg Final  . MCHC 03/01/2018 31.5  30.0 - 36.0 g/dL Final  . RDW 03/01/2018 13.2  11.5 - 15.5 % Final  . Platelets 03/01/2018 317  150 - 400 K/uL Final  . nRBC 03/01/2018 0.0  0.0 - 0.2 % Final  . Neutrophils Relative % 03/01/2018 64  % Final  . Neutro Abs 03/01/2018 3.6  1.7 - 7.7 K/uL Final  . Lymphocytes Relative 03/01/2018 25  % Final  . Lymphs Abs 03/01/2018 1.4  0.7 - 4.0 K/uL Final  . Monocytes Relative 03/01/2018 7  % Final  . Monocytes Absolute 03/01/2018 0.4  0.1 - 1.0 K/uL Final  . Eosinophils Relative 03/01/2018 3  % Final  . Eosinophils Absolute 03/01/2018 0.2  0.0 - 0.5 K/uL Final  . Basophils Relative 03/01/2018 1  % Final  . Basophils Absolute 03/01/2018 0.0  0.0 - 0.1 K/uL Final  . Immature Granulocytes 03/01/2018 0  % Final  . Abs Immature Granulocytes 03/01/2018 0.02  0.00 - 0.07 K/uL Final   Performed at [facility], [place] [address], [place], [place] [address]    (this displays the last labs from the last 3 days)  No results found for: TOTALPROTELP, ALBUMINELP, A1GS, A2GS, BETS, BETA2SER, GAMS, MSPIKE, SPEI (this displays SPEP labs)  No results found for: KPAFRELGTCHN, LAMBDASER, KAPLAMBRATIO (kappa/lambda light chains)  No results found for: HGBA, HGBA2QUANT, HGBFQUANT, HGBSQUAN (Hemoglobinopathy evaluation)   No results found for: LDH  No results found for: IRON, TIBC, IRONPCTSAT (Iron and TIBC)  No results found for: FERRITIN  Urinalysis    Component Value Date/Time   COLORURINE YELLOW 10/02/2015 2022   APPEARANCEUR CLOUDY (A) 10/02/2015 2022   LABSPEC 1.020 10/02/2015 2022   PHURINE 6.5 10/02/2015 2022   GLUCOSEU NEGATIVE 10/02/2015 2022   HGBUR NEGATIVE 10/02/2015 2022   BILIRUBINUR NEGATIVE 10/02/2015 2022   KETONESUR 15 (A) 10/02/2015 2022    PROTEINUR NEGATIVE 10/02/2015 2022   UROBILINOGEN 0.2 09/20/2014 1536   NITRITE NEGATIVE 10/02/2015 2022   LEUKOCYTESUR SMALL (A) 10/02/2015 2022     STUDIES: No results found.   ELIGIBLE FOR AVAILABLE RESEARCH PROTOCOL: No   ASSESSMENT: 58 y.o. [name], [place] woman status post left breast upper outer quadrant lumpectomy 11/25/2017 for a pT1b pNX, stage Ia invasive ductal carcinoma, grade 1, estrogen and progesterone receptor positive, HER-2/neu negative, with an MIB-1 of 3%  (1) status post left axillary lymph node sampling 12/09/2017, all 4 sentinel lymph nodes clear  (2) Oncotype score of 24 predicts a 10-year risk of recurrence outside the breast over the next 9 years if the patient's only systemic therapy is an estrogens for 5 years.  It also predicts no benefit from chemotherapy  (3) adjuvant radiation to be completed 03/15/2018  (4) started tamoxifen 04/16/2018  (a) changed to 10 mg daily May 2020 per patient   PLAN: Bethany [name] was not able to tolerate tamoxifen on 20 mg but has discovered that she does quite well with 20 mg daily.  We reviewed the data on tamoxifen dosing including the recent study using 5 mg in noninvasive disease.  I think taking all in all I am not uncomfortable with her continuing tamoxifen at 10 mg daily and that will be her dose.  I gave her a copy of her CT of the chest obtained 4 years ago which showed some bullous changes in her lungs.  She quit smoking around that time.  I do not think the shortness of breath she is experiencing is going to be due to that.  I suspect deconditioning is the real reason.  I suggested she shoot for 45 minutes of walking 5 days a week.  We will review that at the next visit  I have no explanation for her falling.  I think she would benefit from rehab for this and I have placed the referral  She will be due for mammography next month and I have entered that order  Otherwise she has close follow-up through her multiple other  physicians and she will see me again March of next year.  She knows to call for any other issues that may develop before then.  Magrinat, Virgie Dad, MD  10/11/18 8:58 AM Medical Oncology and Hematology [facility] [address] [place], [place] [address] Tel. [phone]    Fax. [phone]   I, Wilburn Mylar, am acting as scribe for Dr. Virgie Dad. Magrinat.  I, Lurline Del MD, have reviewed the above documentation for accuracy and completeness, and I agree with the above.

## 2018-10-12 ENCOUNTER — Ambulatory Visit (INDEPENDENT_AMBULATORY_CARE_PROVIDER_SITE_OTHER): Payer: 59 | Admitting: Obstetrics & Gynecology

## 2018-10-12 ENCOUNTER — Encounter: Payer: Self-pay | Admitting: Obstetrics & Gynecology

## 2018-10-12 VITALS — BP 150/94 | Ht 65.5 in | Wt 188.0 lb

## 2018-10-12 DIAGNOSIS — Z713 Dietary counseling and surveillance: Secondary | ICD-10-CM

## 2018-10-12 DIAGNOSIS — E6609 Other obesity due to excess calories: Secondary | ICD-10-CM

## 2018-10-12 DIAGNOSIS — Z6830 Body mass index (BMI) 30.0-30.9, adult: Secondary | ICD-10-CM | POA: Diagnosis not present

## 2018-10-12 MED ORDER — PHENTERMINE HCL 30 MG PO CAPS: 30.0000 mg | ORAL_CAPSULE | ORAL | 0 refills | Status: DC

## 2018-10-13 ENCOUNTER — Encounter: Payer: Self-pay | Admitting: Obstetrics & Gynecology

## 2018-10-13 NOTE — Patient Instructions (Signed)
1. Weight loss counseling, encounter for Successful weight loss on phentermine.  Blood pressure increased on exam today.  Per patient, blood pressures were within normal at home and at her last medical visit.  On losartan for chronic hypertension.  Will f/u with Fam MD to adjust medication.  Continue low-salt diet.  Agreed to represcribed phentermine but only for 1 month.  Patient will monitor her blood pressure at home.  Intermittent fasting recommended.  2. Class 1 obesity due to excess calories without serious comorbidity with body mass index (BMI) of 30.0 to 30.9 in adult As above.  Other orders - phentermine 30 MG capsule; Take 1 capsule (30 mg total) by mouth every morning.  Jeannie Done, it was a pleasure seeing you today!

## 2018-10-13 NOTE — Progress Notes (Signed)
    Bethany [name] June 08, 1960 [id number]        58 y.o.  [id number] Married  RP: Weight loss counseling  HPI: Weight loss on Phentermine.  Lost 14 Lbs since 06/2018.  Low calorie/carb diet and increased physical activities.     OB History  Gravida Para Term Preterm AB Living  3 3 3     3   SAB TAB Ectopic Multiple Live Births               # Outcome Date GA Lbr Len/2nd Weight Sex Delivery Anes PTL Lv  3 Term           2 Term           1 Term             Past medical history,surgical history, problem list, medications, allergies, family history and social history were all reviewed and documented in the EPIC chart.   Directed ROS with pertinent positives and negatives documented in the history of present illness/assessment and plan.  Exam:  Vitals:   10/12/18 1235  BP: (!) 150/94  Weight: 188 lb (85.3 kg)  Height: 5' 5.5" (1.664 m)   BMI 30.8  General appearance:  Normal  Gyn exam deferred   Assessment/Plan:  58 y.o. G3P3003   1. Weight loss counseling, encounter for Successful weight loss on phentermine.  Blood pressure increased on exam today.  Per patient, blood pressures were within normal at home and at her last medical visit.  On losartan for chronic hypertension.  Will f/u with Fam MD to adjust medication.  Continue low-salt diet.  Agreed to represcribed phentermine but only for 1 month.  Patient will monitor her blood pressure at home.  Intermittent fasting recommended.  2. Class 1 obesity due to excess calories without serious comorbidity with body mass index (BMI) of 30.0 to 30.9 in adult As above.  Other orders - phentermine 30 MG capsule; Take 1 capsule (30 mg total) by mouth every morning.  Counseling on above issues and coordination of care more than 50% for 25 minutes.  [name] MD, 8:30 AM 10/13/2018

## 2018-10-17 ENCOUNTER — Ambulatory Visit: Payer: 59 | Admitting: Rehabilitation

## 2018-10-17 ENCOUNTER — Encounter: Payer: 59 | Admitting: Adult Health

## 2018-10-26 ENCOUNTER — Encounter: Payer: Self-pay | Admitting: Pulmonary Disease

## 2018-10-26 ENCOUNTER — Other Ambulatory Visit: Payer: Self-pay

## 2018-10-26 ENCOUNTER — Ambulatory Visit (INDEPENDENT_AMBULATORY_CARE_PROVIDER_SITE_OTHER): Payer: 59 | Admitting: Pulmonary Disease

## 2018-10-26 VITALS — BP 130/88 | HR 77 | Temp 97.6°F | Ht 65.75 in | Wt 186.8 lb

## 2018-10-26 DIAGNOSIS — Z87891 Personal history of nicotine dependence: Secondary | ICD-10-CM

## 2018-10-26 DIAGNOSIS — Z923 Personal history of irradiation: Secondary | ICD-10-CM

## 2018-10-26 DIAGNOSIS — C50912 Malignant neoplasm of unspecified site of left female breast: Secondary | ICD-10-CM

## 2018-10-26 DIAGNOSIS — R0602 Shortness of breath: Secondary | ICD-10-CM | POA: Diagnosis not present

## 2018-10-26 MED ORDER — ANORO ELLIPTA 62.5-25 MCG/INH IN AEPB: 1.0000 | INHALATION_SPRAY | Freq: Every day | RESPIRATORY_TRACT | 0 refills | Status: DC

## 2018-10-26 NOTE — Progress Notes (Signed)
Patient seen in the office today and instructed on use of Anoro.  Patient expressed understanding and demonstrated technique. 

## 2018-10-26 NOTE — Patient Instructions (Addendum)
Thank you for visiting Dr. Valeta Harms at [facility]. Today we recommend the following:  Orders Placed This Encounter  Procedures  . CT CHEST WO CONTRAST  . Pulmonary Function Test   Return in about 2-3 weeks (around 11/09/2018) for with APP.    Please do your part to reduce the spread of COVID-19.

## 2018-10-26 NOTE — Progress Notes (Signed)
Synopsis: Referred in August 2020 for shortness of breath by Leeroy Cha,*  Subjective:   PATIENT ID: Bethany [name] GENDER: female DOB: 02/16/1961, MRN: [id number]  Chief Complaint  Patient presents with  . Consult    Consult re: SOB. She reports she developed SOB in 2016 after a plane ride. She has been told it might be Emphysema. Over the last couple months it has gotten worse. She is currenlty going through Radiation for Left Breast Cancer. Completed radiation 02/2018.     58 year old female found to have left breast mass diagnosed with estrogen receptor positive breast cancer, underwent lumpectomy in September 2019, grade 1 invasive ductal carcinoma, margins negative, 90% positive ER, progesterone +80% HER-2 negative.  Underwent left sentinel lymph node sampling, 0 out of 4, negative for carcinoma.  Patient was started on tamoxifen.  Patient received planning from radiation oncology consistent with whole breast radio technique for 50.4 Gy. Former smoker, 16-52, 0.5ppd.   OV 10/26/2018: Today, still having issues with SOB. She feels winded. She noticed it first 4 years ago. She was told she might have COPD. She is feeling like she doesn't have the stamina.  She has a 30-year-old grandchild that she watches on a regular basis.  She feels short of breath she is out doing things or trying to be active.  She does occasionally have hot flashes that she attributes to being on the tamoxifen.  She has no chest pain.  Denies fevers chills night sweats weight loss.  Predominant complaint is dyspnea.   Past Medical History:  Diagnosis Date  . Anxiety   . Breast mass, left 11/2017   going thru radiation until 02/2018  . Cancer [facility])    left breast cancer/diagnosed in 10/2017/taking radiation treatment until 03/15/18  . COPD (chronic obstructive pulmonary disease) ([place])    beginning stages/small scar  . Dental crowns present   . History of hiatal hernia    no current med.  .  Hypertension    states under control with med., has been on med. x 1 yr.  . Hypothyroidism   . MVP (mitral valve prolapse)   . PONV (postoperative nausea and vomiting)   . Vitamin D deficiency      Family History  Problem Relation Age of Onset  . Hypertension Father   . Heart disease Father   . Non-Hodgkin's lymphoma Father   . Hypertension Brother   . Heart disease Paternal Grandfather   . Colon cancer Other        great maternal aunt   . Colon polyps Mother   . Stomach cancer Neg Hx   . Rectal cancer Neg Hx   . Esophageal cancer Neg Hx      Past Surgical History:  Procedure Laterality Date  . ABDOMINAL HYSTERECTOMY     partial  . APPENDECTOMY    . AXILLARY SENTINEL NODE BIOPSY Left 12/09/2017   Procedure: AXILLARY SENTINEL NODE BIOPSY;  Surgeon: Erroll Luna, MD;  Location: [place];  Service: General;  Laterality: Left;  . BREAST LUMPECTOMY WITH NEEDLE LOCALIZATION Left 11/25/2017   Procedure: LEFT BREAST LUMPECTOMY WITH NEEDLE LOCALIZATION;  Surgeon: Erroll Luna, MD;  Location: [place];  Service: General;  Laterality: Left;  . COLONOSCOPY WITH PROPOFOL  10/03/2015  . LAPAROSCOPIC APPENDECTOMY N/A 10/03/2015   Procedure: APPENDECTOMY LAPAROSCOPIC;  Surgeon: Rolm Bookbinder, MD;  Location: [place];  Service: General;  Laterality: N/A;  . TOTAL THYROIDECTOMY  04/02/2003    Social History  Socioeconomic History  . Marital status: Married    Spouse name: Not on file  . Number of children: Not on file  . Years of education: Not on file  . Highest education level: Not on file  Occupational History  . Not on file  Social Needs  . Financial resource strain: Not on file  . Food insecurity    Worry: Not on file    Inability: Not on file  . Transportation needs    Medical: No    Non-medical: No  Tobacco Use  . Smoking status: Former Smoker    Quit date: 08/14/2014    Years since quitting: 4.2  . Smokeless tobacco: Never Used   Substance and Sexual Activity  . Alcohol use: Yes    Comment: rarely  . Drug use: No  . Sexual activity: Yes    Partners: Male  Lifestyle  . Physical activity    Days per week: Not on file    Minutes per session: Not on file  . Stress: Not on file  Relationships  . Social Herbalist on phone: Not on file    Gets together: Not on file    Attends religious service: Not on file    Active member of club or organization: Not on file    Attends meetings of clubs or organizations: Not on file    Relationship status: Not on file  . Intimate partner violence    Fear of current or ex partner: No    Emotionally abused: No    Physically abused: No    Forced sexual activity: No  Other Topics Concern  . Not on file  Social History Narrative  . Not on file     Allergies  Allergen Reactions  . Oxycodone Nausea And Vomiting  . Latex Rash  . Sulfa Antibiotics     hives     Outpatient Medications Prior to Visit  Medication Sig Dispense Refill  . ergocalciferol (VITAMIN D2) 50000 UNITS capsule Take 50,000 Units by mouth 2 (two) times a week. On Thurs & Sun    . levothyroxine (SYNTHROID, LEVOTHROID) 137 MCG tablet Take 150 mcg by mouth at bedtime.     Marland Kitchen losartan (COZAAR) 25 MG tablet Take 25 mg by mouth daily.    . phentermine 30 MG capsule Take 1 capsule (30 mg total) by mouth every morning. 30 capsule 0  . tamoxifen (NOLVADEX) 10 MG tablet Take 1 tablet (10 mg total) by mouth 2 (two) times daily. 90 tablet 4  . venlafaxine XR (EFFEXOR-XR) 37.5 MG 24 hr capsule TAKE 1 CAPSULE BY MOUTH DAILY WITH BREAKFAST FOR 2 WEEKS & THEN MAY INCREASE TO 2 CAPSULES DAILY 180 capsule 2  . losartan-hydrochlorothiazide (HYZAAR) 50-12.5 MG tablet Take 1 tablet by mouth daily.     Facility-Administered Medications Prior to Visit  Medication Dose Route Frequency Provider Last Rate Last Dose  . 0.9 %  sodium chloride infusion  500 mL Intravenous Once Irene Shipper, MD        Review of Systems   Constitutional: Negative for chills, fever, malaise/fatigue and weight loss.  HENT: Negative for hearing loss, sore throat and tinnitus.   Eyes: Negative for blurred vision and double vision.  Respiratory: Positive for shortness of breath. Negative for cough, hemoptysis, sputum production, wheezing and stridor.   Cardiovascular: Negative for chest pain, palpitations, orthopnea, leg swelling and PND.  Gastrointestinal: Negative for abdominal pain, constipation, diarrhea, heartburn, nausea and vomiting.  Genitourinary: Negative  for dysuria, hematuria and urgency.  Musculoskeletal: Negative for joint pain and myalgias.  Skin: Negative for itching and rash.  Neurological: Negative for dizziness, tingling, weakness and headaches.  Endo/Heme/Allergies: Negative for environmental allergies. Does not bruise/bleed easily.  Psychiatric/Behavioral: Negative for depression. The patient is not nervous/anxious and does not have insomnia.   All other systems reviewed and are negative.    Objective:  Physical Exam Vitals signs reviewed.  Constitutional:      General: She is not in acute distress.    Appearance: She is well-developed.  HENT:     Head: Normocephalic and atraumatic.  Eyes:     General: No scleral icterus.    Conjunctiva/sclera: Conjunctivae normal.     Pupils: Pupils are equal, round, and reactive to light.  Neck:     Musculoskeletal: Neck supple.     Vascular: No JVD.     Trachea: No tracheal deviation.  Cardiovascular:     Rate and Rhythm: Normal rate and regular rhythm.     Heart sounds: Normal heart sounds. No murmur.  Pulmonary:     Effort: Pulmonary effort is normal. No tachypnea, accessory muscle usage or respiratory distress.     Breath sounds: Normal breath sounds. No stridor. No wheezing, rhonchi or rales.  Abdominal:     General: Bowel sounds are normal. There is no distension.     Palpations: Abdomen is soft.     Tenderness: There is no abdominal tenderness.   Musculoskeletal:        General: No tenderness.  Lymphadenopathy:     Cervical: No cervical adenopathy.  Skin:    General: Skin is warm and dry.     Capillary Refill: Capillary refill takes less than 2 seconds.     Findings: No rash.  Neurological:     Mental Status: She is alert and oriented to person, place, and time.  Psychiatric:        Behavior: Behavior normal.      Vitals:   10/26/18 1447  BP: 130/88  Pulse: 77  Temp: 97.6 F (36.4 C)  TempSrc: Oral  SpO2: 99%  Weight: 186 lb 12.8 oz (84.7 kg)  Height: 5' 5.75" (1.67 m)   99% on RA BMI Readings from Last 3 Encounters:  10/26/18 30.38 kg/m  10/12/18 30.81 kg/m  10/11/18 30.72 kg/m   Wt Readings from Last 3 Encounters:  10/26/18 186 lb 12.8 oz (84.7 kg)  10/12/18 188 lb (85.3 kg)  10/11/18 187 lb 7 oz (85 kg)     CBC    Component Value Date/Time   WBC 4.9 10/11/2018 1037   RBC 4.33 10/11/2018 1037   HGB 12.9 10/11/2018 1037   HGB 12.7 12/16/2017 1540   HCT 39.8 10/11/2018 1037   PLT 271 10/11/2018 1037   PLT 305 12/16/2017 1540   MCV 91.9 10/11/2018 1037   MCH 29.8 10/11/2018 1037   MCHC 32.4 10/11/2018 1037   RDW 13.3 10/11/2018 1037   LYMPHSABS 1.6 10/11/2018 1037   MONOABS 0.3 10/11/2018 1037   EOSABS 0.2 10/11/2018 1037   BASOSABS 0.0 10/11/2018 1037    Chest Imaging: No recent chest imaging   2016 CT chest with evidence of apical blebs.  Brought in by patient report. The patient's images have been independently reviewed by me.    Pulmonary Functions Testing Results: No flowsheet data found.  FeNO: None   Pathology: None   Echocardiogram: None   Heart Catheterization: None     Assessment & Plan:  ICD-10-CM   1. SOB (shortness of breath)  R06.02 Pulmonary Function Test  2. Hormone receptor positive breast cancer, left ([place])  C50.912   3. H/O therapeutic radiation  Z92.3   4. Former smoker  Z87.891   5. Shortness of breath  R06.02 CT CHEST WO CONTRAST    CANCELED: CT  CHEST WO CONTRAST    Discussion:  This is a 58 year old female with a longstanding history of smoking, approximately 20-pack-year history.  Quit 4 years ago.  She had CT imaging with apical blebs and concern for possible early emphysema.  So she stopped smoking at this time.  No recent pulmonary function test.  In 2019 patient was diagnosed with breast cancer.  Underwent surgery and radiation to the left chest.  She also had significant shortness of breath following radiation treatments.  I think she can have multifactorial reasons for her shortness of breath. We should investigate to start with pulmonary function test. We will also complete a CAT scan of the chest to look for any evidence of radiation-induced fibrosis.  We will give patient sample of Anoro today just to see if it makes any of her symptoms better.  She may very well have mild COPD.  Symptoms would suggest this.  Patient to return to clinic in 2 to 3 weeks or our next available slot for PFTs.  She can see one of our nurse practitioners to review these pulmonary function test as well as CT scan once they are back.  Greater than 50% of this patient's 45-minute of visit was spent face-to-face discussing the above recommendations and treatment plan.  Review of medical record, prior procedural history and imaging.   Current Outpatient Medications:  .  ergocalciferol (VITAMIN D2) 50000 UNITS capsule, Take 50,000 Units by mouth 2 (two) times a week. On Thurs & Sun, Disp: , Rfl:  .  levothyroxine (SYNTHROID, LEVOTHROID) 137 MCG tablet, Take 150 mcg by mouth at bedtime. , Disp: , Rfl:  .  losartan (COZAAR) 25 MG tablet, Take 25 mg by mouth daily., Disp: , Rfl:  .  phentermine 30 MG capsule, Take 1 capsule (30 mg total) by mouth every morning., Disp: 30 capsule, Rfl: 0 .  tamoxifen (NOLVADEX) 10 MG tablet, Take 1 tablet (10 mg total) by mouth 2 (two) times daily., Disp: 90 tablet, Rfl: 4 .  venlafaxine XR (EFFEXOR-XR) 37.5 MG 24 hr  capsule, TAKE 1 CAPSULE BY MOUTH DAILY WITH BREAKFAST FOR 2 WEEKS & THEN MAY INCREASE TO 2 CAPSULES DAILY, Disp: 180 capsule, Rfl: 2 .  losartan-hydrochlorothiazide (HYZAAR) 50-12.5 MG tablet, Take 1 tablet by mouth daily., Disp: , Rfl:   Current Facility-Administered Medications:  .  0.9 %  sodium chloride infusion, 500 mL, Intravenous, Once, Irene Shipper, MD   Garner Nash, DO [facility] 10/26/2018 3:18 PM

## 2018-11-03 ENCOUNTER — Ambulatory Visit: Payer: 59

## 2018-11-04 ENCOUNTER — Other Ambulatory Visit: Payer: Self-pay

## 2018-11-04 ENCOUNTER — Ambulatory Visit
Admission: RE | Admit: 2018-11-04 | Discharge: 2018-11-04 | Disposition: A | Discharge status: Home / Self Care | Payer: 59 | Source: Ambulatory Visit | Attending: Pulmonary Disease | Admitting: Pulmonary Disease

## 2018-11-04 DIAGNOSIS — R0602 Shortness of breath: Secondary | ICD-10-CM | POA: Insufficient documentation

## 2018-11-04 IMAGING — CT CT CHEST WITHOUT CONTRAST
2 of 4 series · 15 of 36 positions shown, 18 images · non-contrast
Comparison: CT chest, [DATE]

CLINICAL DATA: Shortness of breath, history of left breast
radiation, former smoker

EXAM:
CT CHEST WITHOUT CONTRAST
TECHNIQUE: Multidetector CT imaging of the chest was performed following the
standard protocol without IV contrast.

[Series 2: axial chest · axial · 0.59mm/px · z∈[-1186,-900]mm · 12 of 171 slices shown, 15 images]
[im 14/171  mediastinal]
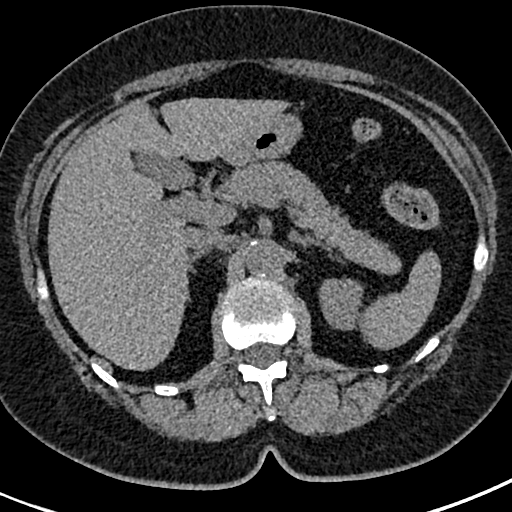
[im 14/171  lung]
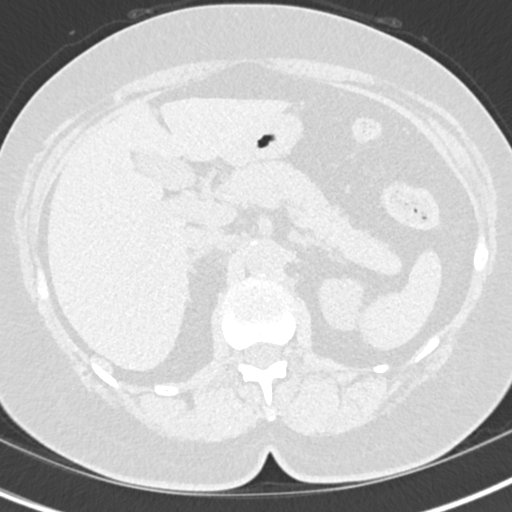
[im 27/171  lung]
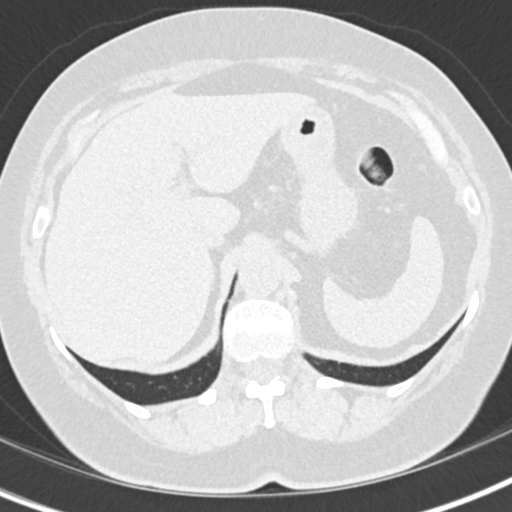
[im 40/171  lung]
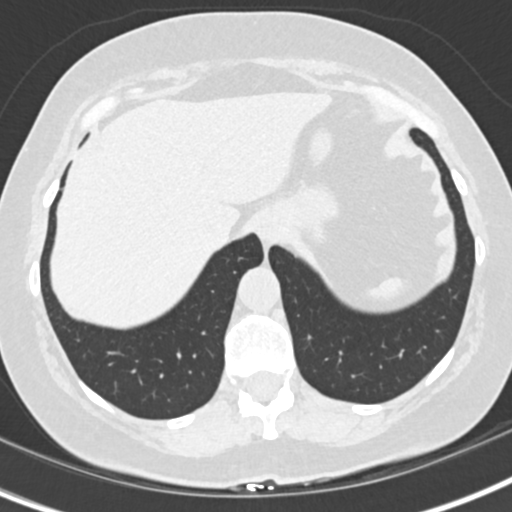
[im 53/171  lung]
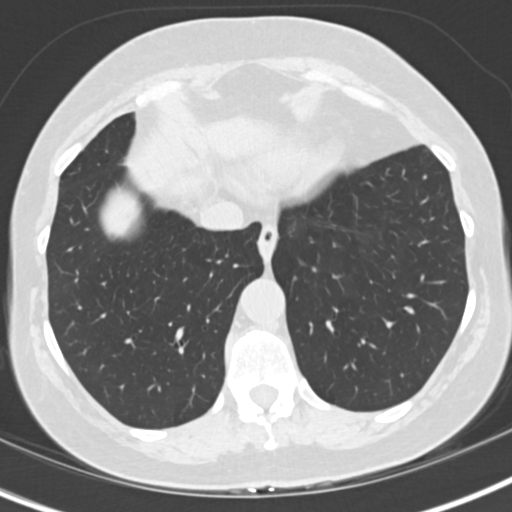
[im 66/171  mediastinal]
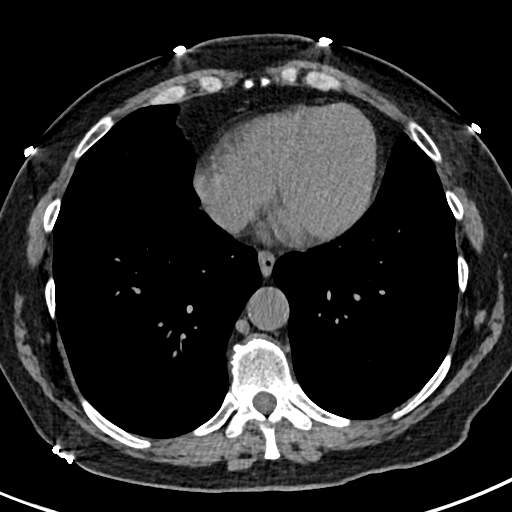
[im 66/171  lung]
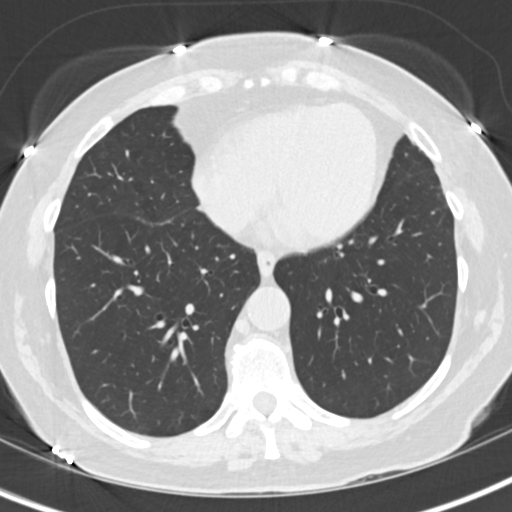
[im 79/171  lung]
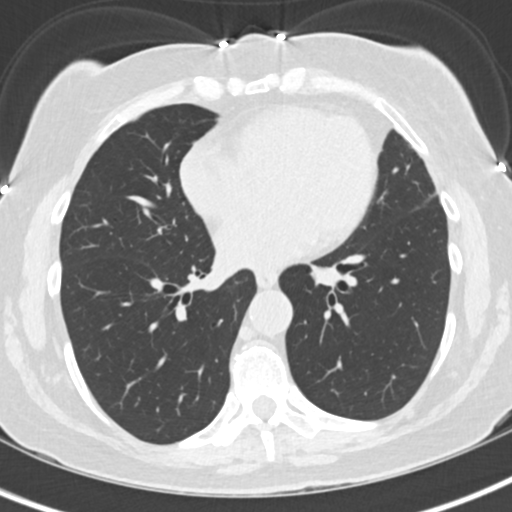
[im 92/171  lung]
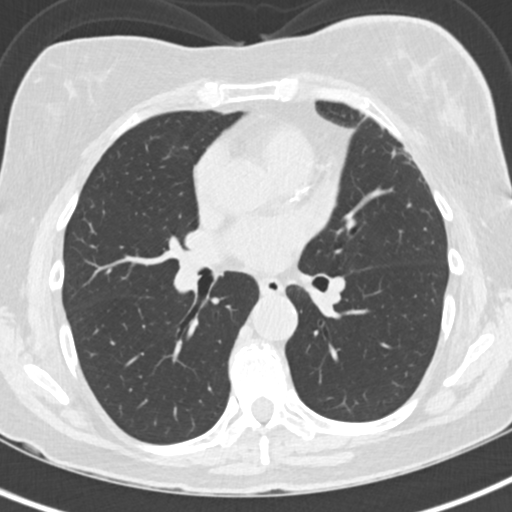
[im 105/171  lung]
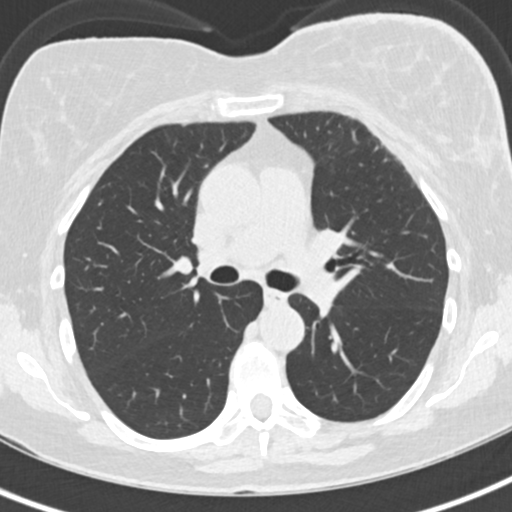
[im 118/171  mediastinal]
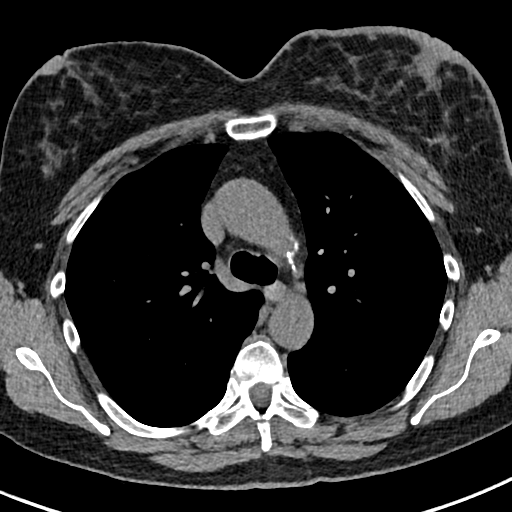
[im 118/171  lung]
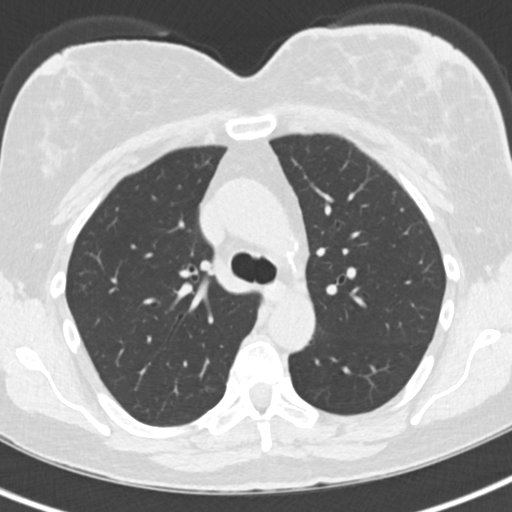
[im 131/171  lung]
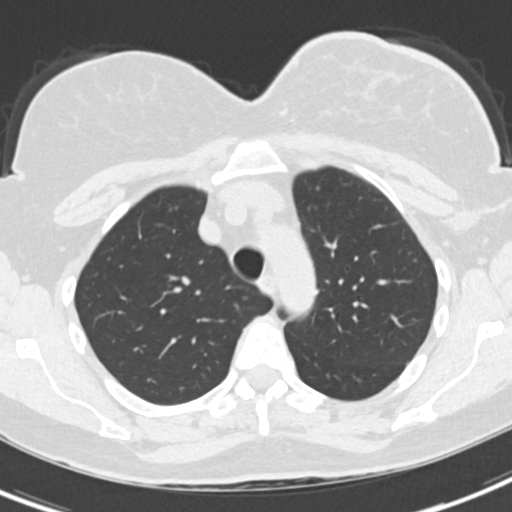
[im 144/171  lung]
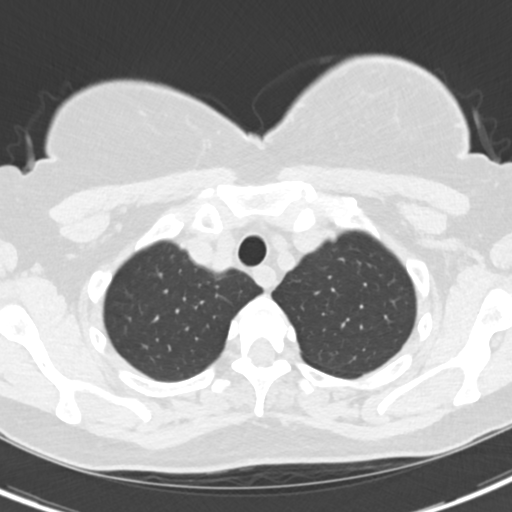
[im 157/171  lung]
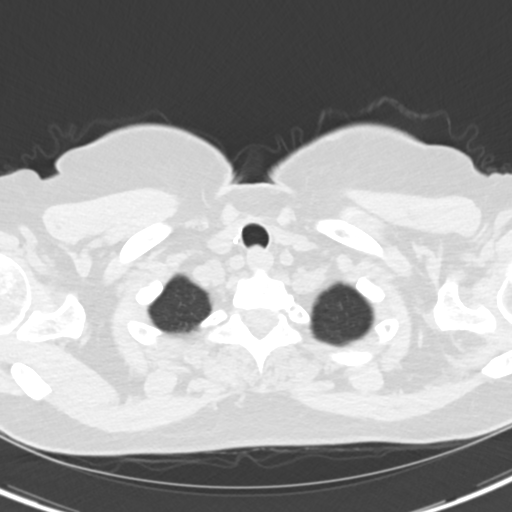

[Series 5: coronal chest · coronal · 0.59mm/px · 3 of 141 slices shown]
[im 29/141  lung]
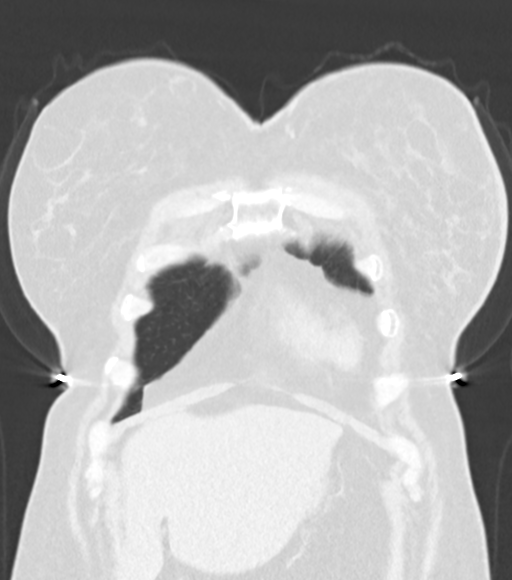
[im 57/141  lung]
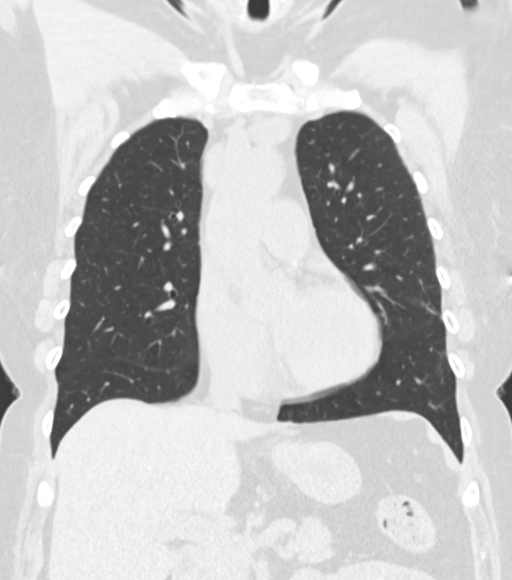
[im 85/141  lung]
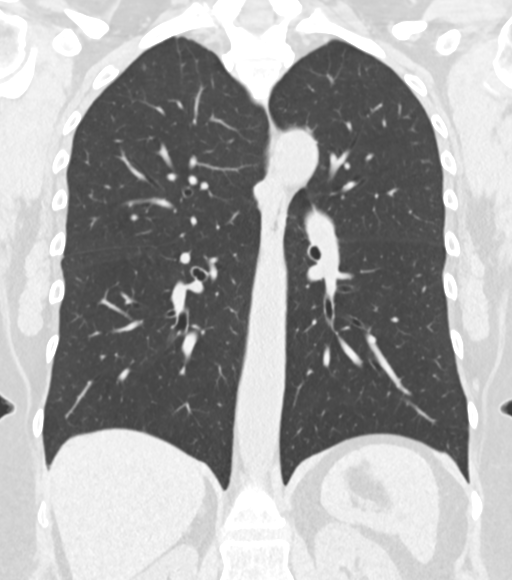

[15 of 36 positions shown; findings below may reference images not displayed]

FINDINGS: Cardiovascular: Minimal aortic atherosclerosis. Normal heart size.
Scattered three-vessel coronary artery calcifications. No
pericardial effusion.

Mediastinum/Nodes: No enlarged mediastinal, hilar, or axillary lymph
nodes. Status post thyroidectomy. Trachea, and esophagus demonstrate
no significant findings.

Lungs/Pleura: There is mild subpleural reticulation and
bronchiolectasis of the anterior left upper lobe and lingula (series
3, image 80). There is a stable, benign 3 mm pulmonary nodule of the
right lower lobe (series 3, image 84). No pleural effusion or
pneumothorax.

Upper Abdomen: No acute abnormality.

Musculoskeletal: No chest wall mass or suspicious bone lesions
identified. There is skin thickening of the left breast in keeping
with history of malignancy.
IMPRESSION: 1. There is mild subpleural reticulation and bronchiolectasis of the
anterior left upper lobe and lingula (series 3, image 80). Findings
are in keeping with minimal radiation fibrosis. No other CT findings
of the lungs to explain shortness of breath.

2.  Coronary artery disease and aortic atherosclerosis.

3. There is skin thickening of the left breast in keeping with
history of malignancy.

## 2018-11-07 ENCOUNTER — Ambulatory Visit: Payer: 59 | Admitting: Rehabilitation

## 2018-11-07 ENCOUNTER — Telehealth: Payer: Self-pay | Admitting: Pulmonary Disease

## 2018-11-07 NOTE — Telephone Encounter (Signed)
Called and talked to patient. Let her know the results weren't back yet. I will route this to Dr. Valeta Harms to result the CT from 11/04/18. Patient also calling in regards to a PFT and when she needs to have it. I explained to her if she was going to have it someone from our office would call her and schedule it, along with a Covid test. Patient verbalized understanding.   Dr. Valeta Harms please advise

## 2018-11-08 NOTE — Telephone Encounter (Signed)
ATC - mailbox full

## 2018-11-08 NOTE — Telephone Encounter (Signed)
Pt returning call and can be reached @ [phone].Bethany [name]

## 2018-11-08 NOTE — Telephone Encounter (Signed)
Advised pt of results. Pt understood and nothing further is needed.   

## 2018-11-08 NOTE — Telephone Encounter (Signed)
PCCM:  You can let the patient know that there is evidence of small amount of fibrotic changes due to her radiation received for breast cancer treatments within the lung. This could explain some of her mild SOB symptoms.   Garner Nash, DO [facility] 11/08/2018 9:25 AM

## 2018-11-08 NOTE — Telephone Encounter (Signed)
Patient is returning your phone call.  Patient phone number is [phone].

## 2018-11-08 NOTE — Telephone Encounter (Signed)
PCCM:  I called and spoke with the patient answered all of her questions.  She was appreciative of the call.  Patient to return to clinic following PFTs.  We will ensure that her PFTs are scheduled.  [facility] 11/08/2018 5:32 PM

## 2018-11-08 NOTE — Telephone Encounter (Signed)
Spoke with pt,, she does have questions:  1. How is she going to treat this?  2. Did the radiation affect her heart?  3. She saw something on the CT about a nodule. Does she not worry about this?

## 2018-11-08 NOTE — Telephone Encounter (Signed)
LMTCB for pt 

## 2018-11-09 ENCOUNTER — Other Ambulatory Visit: Payer: Self-pay

## 2018-11-09 ENCOUNTER — Ambulatory Visit
Admission: RE | Admit: 2018-11-09 | Discharge: 2018-11-09 | Disposition: A | Discharge status: Home / Self Care | Payer: 59 | Source: Ambulatory Visit | Attending: Oncology | Admitting: Oncology

## 2018-11-09 DIAGNOSIS — C50412 Malignant neoplasm of upper-outer quadrant of left female breast: Secondary | ICD-10-CM

## 2018-11-09 DIAGNOSIS — Z170 Estrogen receptor positive status [ER+]: Secondary | ICD-10-CM

## 2018-11-09 IMAGING — MG DIGITAL DIAGNOSTIC BILATERAL MAMMOGRAM WITH TOMO AND CAD
6 of 11 series · 6 of 31 positions shown · non-contrast
Comparison: Previous exam(s).

CLINICAL DATA: History of left breast invasive ductal carcinoma
status post breast conserving therapy. Here for annual mammogram

EXAM:
DIGITAL DIAGNOSTIC BILATERAL MAMMOGRAM WITH CAD AND TOMO

[L MLO]
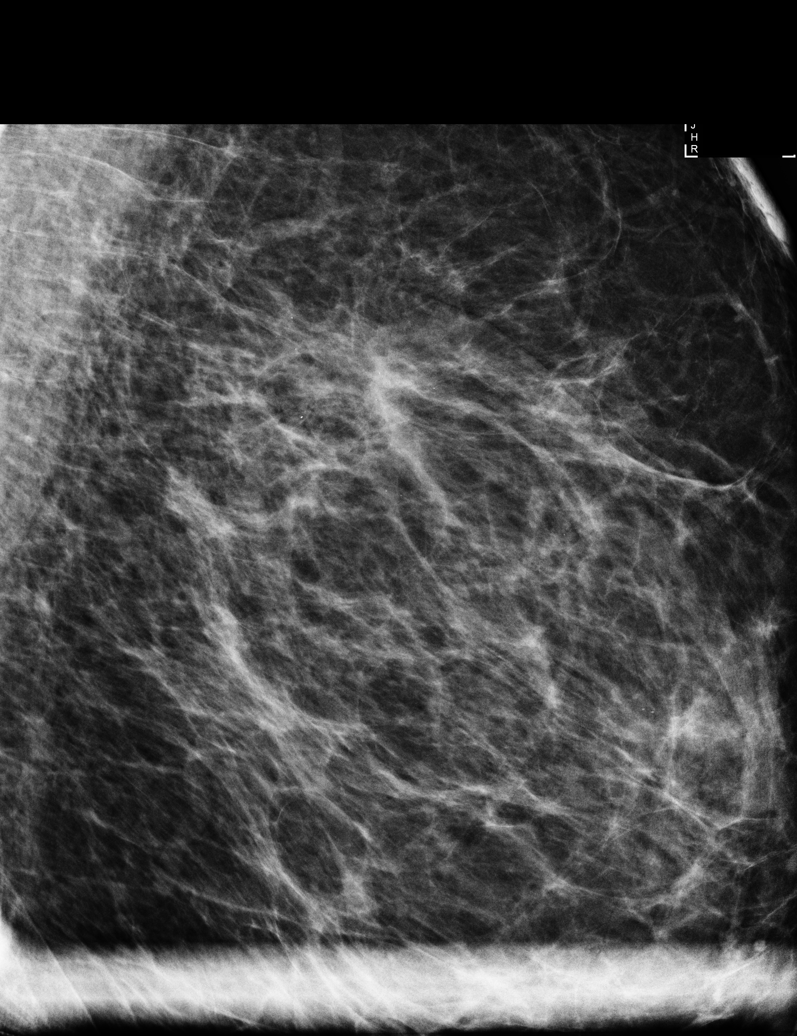

[R CC synth-2D]
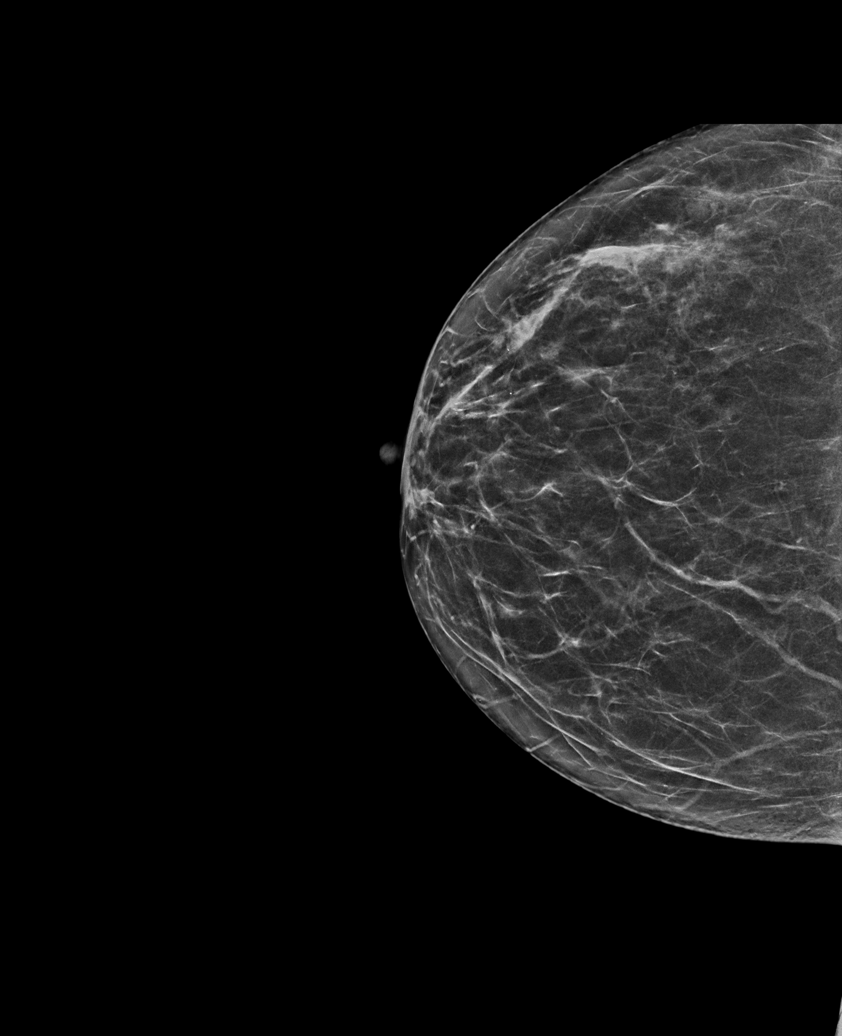

[L MLO synth-2D]
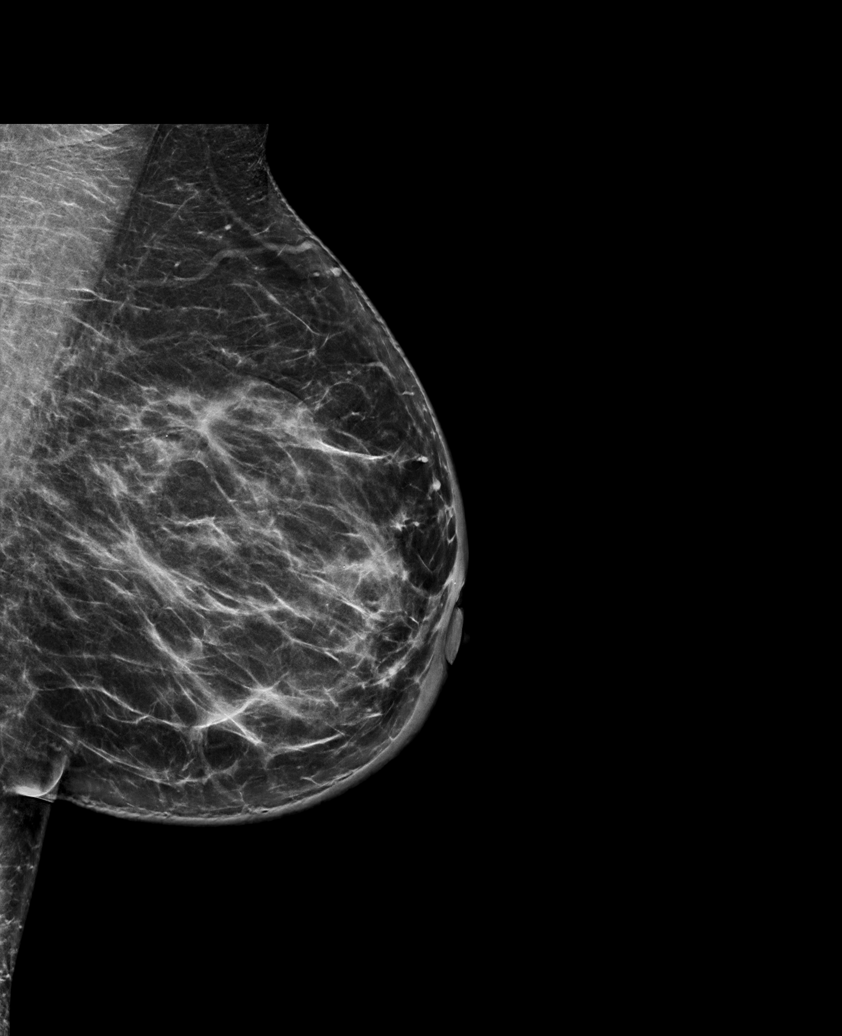

[L CC synth-2D]
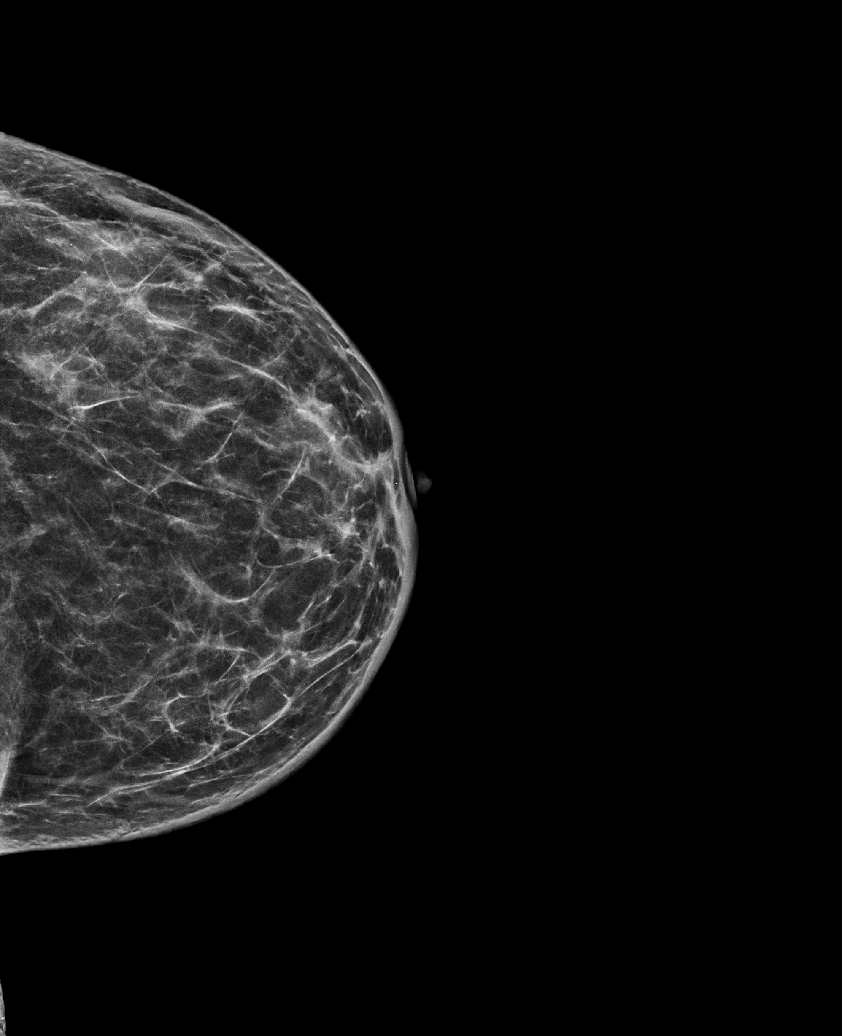

[R MLO synth-2D]
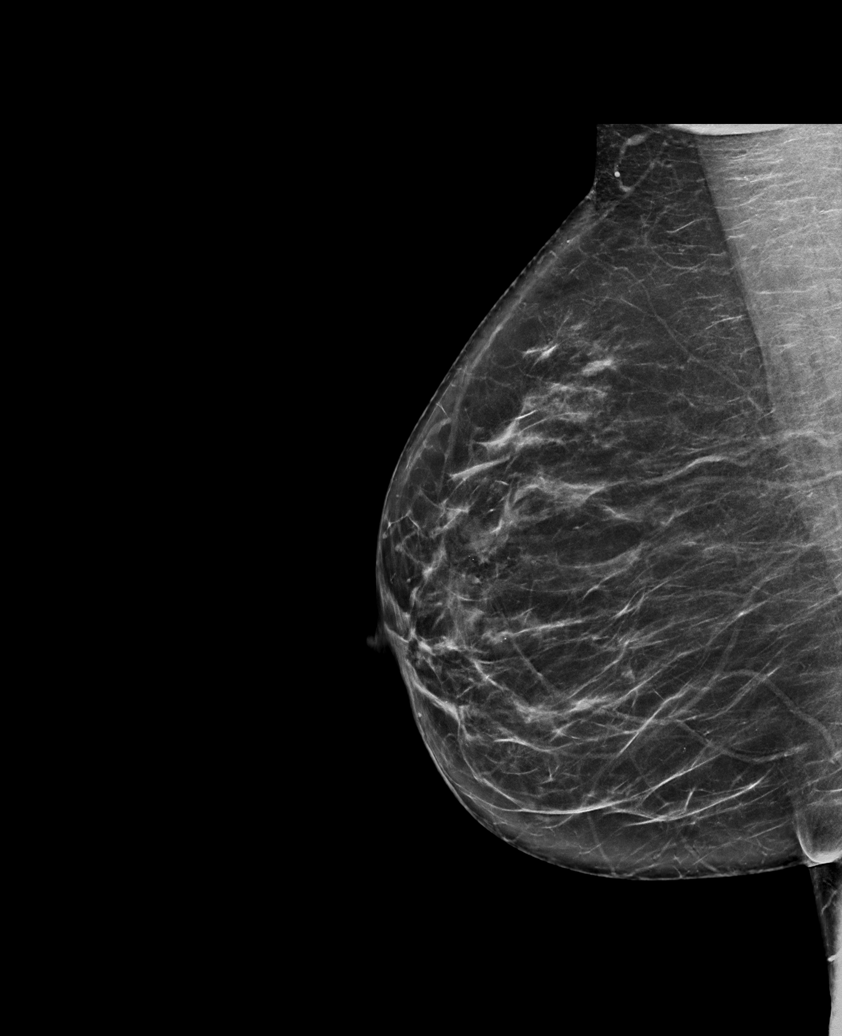

[L XCCL synth-2D]
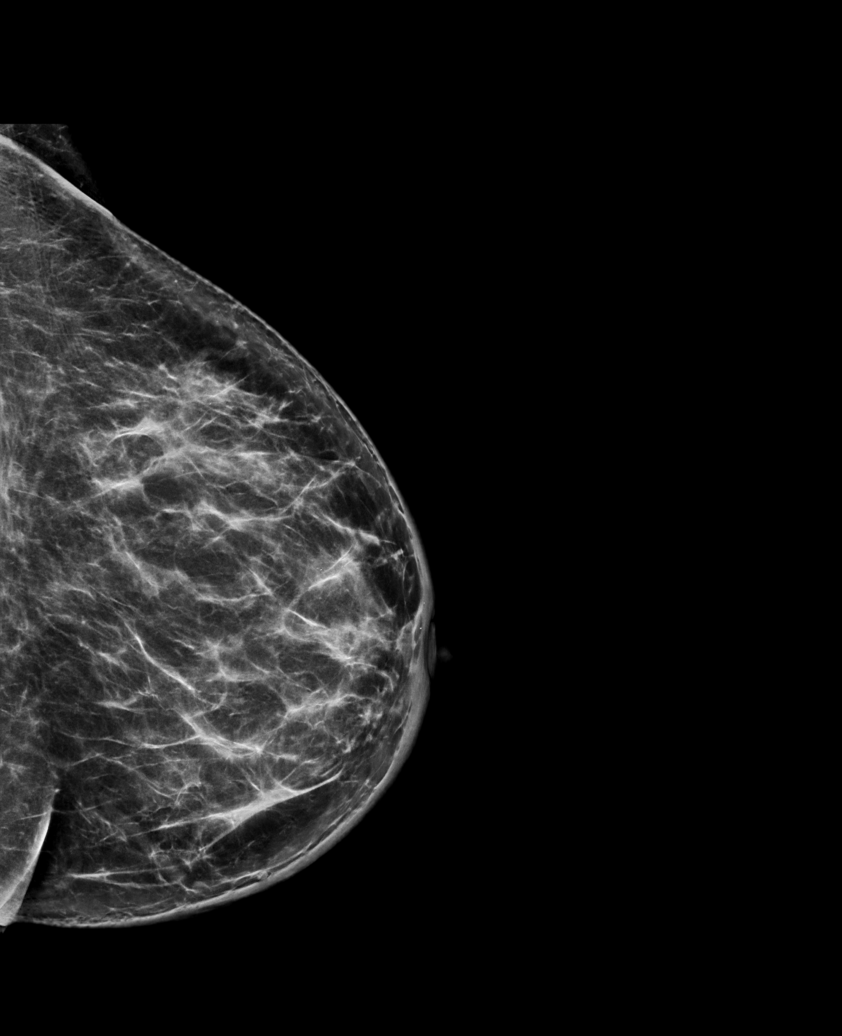

[6 of 31 positions shown; findings below may reference images not displayed]

ACR Breast Density Category b: There are scattered areas of
fibroglandular density.
FINDINGS: Postlumpectomy changes are noted in the left breast. No suspicious
mass, microcalcification, or other finding is identified in either
breast.

Mammographic images were processed with CAD.
IMPRESSION: No evidence of malignancy.

RECOMMENDATION:
Recommend routine annual diagnostic mammogram in 1 year.

I have discussed the findings and recommendations with the patient.
If applicable, a reminder letter will be sent to the patient
regarding the next appointment.

BI-RADS CATEGORY  2: Benign.

## 2018-11-09 NOTE — Telephone Encounter (Signed)
Noted. Will route to Bethany [name] as FYI for PFT. Thanks team.

## 2018-11-10 NOTE — Telephone Encounter (Signed)
Attempted to call pt to schedule pft - vm full -pr  °

## 2018-11-11 NOTE — Telephone Encounter (Signed)
Attempted to call pt - vm full - pr

## 2018-11-15 ENCOUNTER — Other Ambulatory Visit: Payer: Self-pay | Admitting: Pulmonary Disease

## 2018-11-15 NOTE — Telephone Encounter (Signed)
Called and schedule pft for 9/10-pr

## 2018-11-22 ENCOUNTER — Telehealth: Payer: Self-pay

## 2018-11-22 ENCOUNTER — Other Ambulatory Visit (HOSPITAL_COMMUNITY)
Admission: RE | Admit: 2018-11-22 | Discharge: 2018-11-22 | Disposition: A | Discharge status: Home / Self Care | Payer: 59 | Source: Ambulatory Visit | Attending: Pulmonary Disease | Admitting: Pulmonary Disease

## 2018-11-22 DIAGNOSIS — Z01812 Encounter for preprocedural laboratory examination: Secondary | ICD-10-CM | POA: Insufficient documentation

## 2018-11-22 DIAGNOSIS — Z20828 Contact with and (suspected) exposure to other viral communicable diseases: Secondary | ICD-10-CM | POA: Insufficient documentation

## 2018-11-22 LAB — SARS CORONAVIRUS 2 (TAT 6-24 HRS): SARS Coronavirus 2: NEGATIVE

## 2018-11-22 NOTE — Telephone Encounter (Signed)
Pt lvm stating that she has stopped taking her tamoxifen d/t increasing body aches and pains despite being on a lower dose.  She stated she also has bruises on her arms and legs. RN returned call.  LVM for to call back.

## 2018-11-22 NOTE — Telephone Encounter (Signed)
Per Dr Jana Hakim, pt may stay off of the tamoxifen.  Options will be discussed at her appt with Mendel Ryder in Oct.  Pt verbalizes understanding.

## 2018-11-23 ENCOUNTER — Telehealth: Payer: Self-pay | Admitting: *Deleted

## 2018-11-23 DIAGNOSIS — R55 Syncope and collapse: Secondary | ICD-10-CM

## 2018-11-23 DIAGNOSIS — C50412 Malignant neoplasm of upper-outer quadrant of left female breast: Secondary | ICD-10-CM

## 2018-11-23 NOTE — Telephone Encounter (Signed)
This RN spoke with pt and her husband per concern with having another fall yesterday- her husband is concerned  " should she be driving ?"  Note pt's fall yesterday was unexpected " just got off balance and fell and hit my forehead "  She states her husband is concerned " and has few questions "  He husband stated concerns " should she be driving ?"  Discussed issues relating to concerns with driving and pt's balance issues. Per discussion with pt she does not have any " black out " or memory loss relating to falling " more just my balance"  Patrick Jupiter then also asked " I don't think she drinks enough and could that be a problem ?"  This RN discussed need for 1-2 liters of fluid daily and per discussion with pt verified medication list including losartan with HCTZ which could be dehydrating her. Donicia states she was " falling before starting the BP medication "   She states falls started shortly after starting the tamoxifen with having " oh at least 20 falls in total " since Jan 2020. Falls did not seem to become worse when she was started on the BP medication.  This RN discussed concerns with falls- and not a common or expected side effect of tamoxifen, but since falls seemed to have occurred post starting we are concerned. She stopped the tamoxifen over the weekend and understands it may take up to 2 weeks for full clearance of the medication from her body.  Per phone call- plan is pt will hydrate more aggressively over the next few days and not drive during this time. She will be referred to Physical Therapy for vestibular balance evaluation.  Of note this RN did suggest pt also monitor and may need to contact the primary MD if her symptoms continue and are not related to the tamoxifen. She is scheduled to be seen early October 2020 but understands if needed she could be seen sooner.  No further needs at this time.

## 2018-11-24 ENCOUNTER — Encounter: Payer: Self-pay | Admitting: Pulmonary Disease

## 2018-11-24 ENCOUNTER — Other Ambulatory Visit: Payer: Self-pay

## 2018-11-24 ENCOUNTER — Ambulatory Visit (INDEPENDENT_AMBULATORY_CARE_PROVIDER_SITE_OTHER): Payer: 59 | Admitting: Pulmonary Disease

## 2018-11-24 VITALS — BP 132/80 | HR 68 | Temp 97.8°F | Ht 67.0 in | Wt 191.0 lb

## 2018-11-24 DIAGNOSIS — J4520 Mild intermittent asthma, uncomplicated: Secondary | ICD-10-CM

## 2018-11-24 DIAGNOSIS — R0602 Shortness of breath: Secondary | ICD-10-CM | POA: Diagnosis not present

## 2018-11-24 DIAGNOSIS — C50912 Malignant neoplasm of unspecified site of left female breast: Secondary | ICD-10-CM | POA: Diagnosis not present

## 2018-11-24 DIAGNOSIS — Z87891 Personal history of nicotine dependence: Secondary | ICD-10-CM | POA: Diagnosis not present

## 2018-11-24 DIAGNOSIS — Z923 Personal history of irradiation: Secondary | ICD-10-CM

## 2018-11-24 LAB — PULMONARY FUNCTION TEST
DL/VA % pred: 119 %
DL/VA: 4.99 ml/min/mmHg/L
DLCO cor % pred: 111 %
DLCO cor: 25.31 ml/min/mmHg
DLCO unc % pred: 109 %
DLCO unc: 24.92 ml/min/mmHg
FEF 25-75 Post: 2.78 L/sec
FEF 25-75 Pre: 2.31 L/sec
FEF2575-%Change-Post: 20 %
FEF2575-%Pred-Post: 103 %
FEF2575-%Pred-Pre: 85 %
FEV1-%Change-Post: 3 %
FEV1-%Pred-Post: 88 %
FEV1-%Pred-Pre: 85 %
FEV1-Post: 2.61 L
FEV1-Pre: 2.51 L
FEV1FVC-%Change-Post: 4 %
FEV1FVC-%Pred-Pre: 100 %
FEV6-%Change-Post: 0 %
FEV6-%Pred-Post: 86 %
FEV6-%Pred-Pre: 86 %
FEV6-Post: 3.16 L
FEV6-Pre: 3.18 L
FEV6FVC-%Change-Post: 0 %
FEV6FVC-%Pred-Post: 103 %
FEV6FVC-%Pred-Pre: 103 %
FVC-%Change-Post: 0 %
FVC-%Pred-Post: 83 %
FVC-%Pred-Pre: 84 %
FVC-Post: 3.16 L
FVC-Pre: 3.18 L
Post FEV1/FVC ratio: 82 %
Post FEV6/FVC ratio: 100 %
Pre FEV1/FVC ratio: 79 %
Pre FEV6/FVC Ratio: 100 %
RV % pred: 97 %
RV: 2 L
TLC % pred: 90 %
TLC: 4.98 L

## 2018-11-24 MED ORDER — BUDESONIDE-FORMOTEROL FUMARATE 80-4.5 MCG/ACT IN AERO: 2.0000 | INHALATION_SPRAY | Freq: Two times a day (BID) | RESPIRATORY_TRACT | 0 refills | Status: DC

## 2018-11-24 MED ORDER — ALBUTEROL SULFATE HFA 108 (90 BASE) MCG/ACT IN AERS: 2.0000 | INHALATION_SPRAY | Freq: Four times a day (QID) | RESPIRATORY_TRACT | 6 refills | Status: AC | PRN

## 2018-11-24 MED ORDER — BUDESONIDE-FORMOTEROL FUMARATE 80-4.5 MCG/ACT IN AERO: 2.0000 | INHALATION_SPRAY | Freq: Two times a day (BID) | RESPIRATORY_TRACT | 6 refills | Status: DC

## 2018-11-24 NOTE — Progress Notes (Signed)
Full PFT performed today. °

## 2018-11-24 NOTE — Progress Notes (Signed)
Synopsis: Referred in August 2020 for shortness of breath by Leeroy Cha,*  Subjective:   PATIENT ID: Bethany [name] GENDER: female DOB: [birth year], MRN: [id number]  Chief Complaint  Patient presents with  . Shortness of Breath    Discuss PFT results    58 year old female found to have left breast mass diagnosed with estrogen receptor positive breast cancer, underwent lumpectomy in September 2019, grade 1 invasive ductal carcinoma, margins negative, 90% positive ER, progesterone +80% HER-2 negative.  Underwent left sentinel lymph node sampling, 0 out of 4, negative for carcinoma.  Patient was started on tamoxifen.  Patient received planning from radiation oncology consistent with whole breast radio technique for 50.4 Gy. Former smoker, 16-52, 0.5ppd.   OV 10/26/2018: Today, still having issues with SOB. She feels winded. She noticed it first 4 years ago. She was told she might have COPD. She is feeling like she doesn't have the stamina.  She has a 60-year-old grandchild that she watches on a regular basis.  She feels short of breath she is out doing things or trying to be active.  She does occasionally have hot flashes that she attributes to being on the tamoxifen.  She has no chest pain.  Denies fevers chills night sweats weight loss.  Predominant complaint is dyspnea.  OV 11/24/2018: Patient seen today for follow-up of shortness of breath/dyspnea.  Patient had pulmonary function test completed prior to today's office visit.  Revealed a normal FEV1 and FVC, normal ratio, normal TLC 90% predicted, ERV 30%, DLCO 109%.  The inhaler that she was started on last time did improve her symptomatology.  She is occasionally having the symptoms with significant exertion.  She is trying to increase her exercise tolerance.  She has noticed some symptoms at nighttime in comparison to the day.  No obvious wheezing she thinks.  She denies any significant allergies.   Past Medical History:  Diagnosis  Date  . Anxiety   . Breast mass, left 11/2017   going thru radiation until 02/2018  . Cancer [facility])    left breast cancer/diagnosed in 10/2017/taking radiation treatment until 03/15/18  . COPD (chronic obstructive pulmonary disease) ([place])    beginning stages/small scar  . Dental crowns present   . History of hiatal hernia    no current med.  . Hypertension    states under control with med., has been on med. x 1 yr.  . Hypothyroidism   . MVP (mitral valve prolapse)   . Personal history of radiation therapy   . PONV (postoperative nausea and vomiting)   . Vitamin D deficiency      Family History  Problem Relation Age of Onset  . Hypertension Father   . Heart disease Father   . Non-Hodgkin's lymphoma Father   . Hypertension Brother   . Heart disease Paternal Grandfather   . Colon cancer Other        great maternal aunt   . Colon polyps Mother   . Stomach cancer Neg Hx   . Rectal cancer Neg Hx   . Esophageal cancer Neg Hx      Past Surgical History:  Procedure Laterality Date  . ABDOMINAL HYSTERECTOMY     partial  . APPENDECTOMY    . AXILLARY SENTINEL NODE BIOPSY Left 12/09/2017   Procedure: AXILLARY SENTINEL NODE BIOPSY;  Surgeon: Erroll Luna, MD;  Location: [place];  Service: General;  Laterality: Left;  . BREAST LUMPECTOMY Left 11/2017  . BREAST LUMPECTOMY WITH NEEDLE LOCALIZATION  Left 11/25/2017   Procedure: LEFT BREAST LUMPECTOMY WITH NEEDLE LOCALIZATION;  Surgeon: Erroll Luna, MD;  Location: [place];  Service: General;  Laterality: Left;  . COLONOSCOPY WITH PROPOFOL  10/03/2015  . LAPAROSCOPIC APPENDECTOMY N/A 10/03/2015   Procedure: APPENDECTOMY LAPAROSCOPIC;  Surgeon: Rolm Bookbinder, MD;  Location: [place];  Service: General;  Laterality: N/A;  . TOTAL THYROIDECTOMY  04/02/2003    Social History   Socioeconomic History  . Marital status: Married    Spouse name: Not on file  . Number of children: Not on file  . Years  of education: Not on file  . Highest education level: Not on file  Occupational History  . Not on file  Social Needs  . Financial resource strain: Not on file  . Food insecurity    Worry: Not on file    Inability: Not on file  . Transportation needs    Medical: No    Non-medical: No  Tobacco Use  . Smoking status: Former Smoker    Quit date: 08/14/2014    Years since quitting: 4.2  . Smokeless tobacco: Never Used  Substance and Sexual Activity  . Alcohol use: Yes    Comment: rarely  . Drug use: No  . Sexual activity: Yes    Partners: Male  Lifestyle  . Physical activity    Days per week: Not on file    Minutes per session: Not on file  . Stress: Not on file  Relationships  . Social Herbalist on phone: Not on file    Gets together: Not on file    Attends religious service: Not on file    Active member of club or organization: Not on file    Attends meetings of clubs or organizations: Not on file    Relationship status: Not on file  . Intimate partner violence    Fear of current or ex partner: No    Emotionally abused: No    Physically abused: No    Forced sexual activity: No  Other Topics Concern  . Not on file  Social History Narrative  . Not on file     Allergies  Allergen Reactions  . Oxycodone Nausea And Vomiting  . Latex Rash  . Sulfa Antibiotics     hives     Outpatient Medications Prior to Visit  Medication Sig Dispense Refill  . ergocalciferol (VITAMIN D2) 50000 UNITS capsule Take 50,000 Units by mouth 2 (two) times a week. On Thurs & Sun    . levothyroxine (SYNTHROID, LEVOTHROID) 137 MCG tablet Take 150 mcg by mouth at bedtime.     Marland Kitchen losartan-hydrochlorothiazide (HYZAAR) 50-12.5 MG tablet Take 1 tablet by mouth daily.    Marland Kitchen umeclidinium-vilanterol (ANORO ELLIPTA) 62.5-25 MCG/INH AEPB Inhale 1 puff into the lungs daily. 3 each 0  . venlafaxine XR (EFFEXOR-XR) 37.5 MG 24 hr capsule TAKE 1 CAPSULE BY MOUTH DAILY WITH BREAKFAST FOR 2 WEEKS &  THEN MAY INCREASE TO 2 CAPSULES DAILY 180 capsule 2   Facility-Administered Medications Prior to Visit  Medication Dose Route Frequency Provider Last Rate Last Dose  . 0.9 %  sodium chloride infusion  500 mL Intravenous Once Irene Shipper, MD        Review of Systems  Constitutional: Negative for chills, fever, malaise/fatigue and weight loss.  HENT: Negative for hearing loss, sore throat and tinnitus.   Eyes: Negative for blurred vision and double vision.  Respiratory: Positive for shortness of breath. Negative  for cough, hemoptysis, sputum production, wheezing and stridor.   Cardiovascular: Negative for chest pain, palpitations, orthopnea, leg swelling and PND.  Gastrointestinal: Negative for abdominal pain, constipation, diarrhea, heartburn, nausea and vomiting.  Genitourinary: Negative for dysuria, hematuria and urgency.  Musculoskeletal: Negative for joint pain and myalgias.  Skin: Negative for itching and rash.  Neurological: Negative for dizziness, tingling, weakness and headaches.  Endo/Heme/Allergies: Negative for environmental allergies. Does not bruise/bleed easily.  Psychiatric/Behavioral: Negative for depression. The patient is not nervous/anxious and does not have insomnia.   All other systems reviewed and are negative.    Objective:  Physical Exam Vitals signs reviewed.  Constitutional:      General: She is not in acute distress.    Appearance: She is well-developed.  HENT:     Head: Normocephalic and atraumatic.  Eyes:     General: No scleral icterus.    Conjunctiva/sclera: Conjunctivae normal.     Pupils: Pupils are equal, round, and reactive to light.  Neck:     Musculoskeletal: Neck supple.     Vascular: No JVD.     Trachea: No tracheal deviation.  Cardiovascular:     Rate and Rhythm: Normal rate and regular rhythm.     Heart sounds: Normal heart sounds. No murmur.  Pulmonary:     Effort: Pulmonary effort is normal. No tachypnea, accessory muscle usage  or respiratory distress.     Breath sounds: Normal breath sounds. No stridor. No wheezing, rhonchi or rales.  Abdominal:     General: Bowel sounds are normal. There is no distension.     Palpations: Abdomen is soft.     Tenderness: There is no abdominal tenderness.  Musculoskeletal:        General: No tenderness.  Lymphadenopathy:     Cervical: No cervical adenopathy.  Skin:    General: Skin is warm and dry.     Capillary Refill: Capillary refill takes less than 2 seconds.     Findings: No rash.  Neurological:     Mental Status: She is alert and oriented to person, place, and time.  Psychiatric:        Behavior: Behavior normal.      Vitals:   11/24/18 1052  BP: 132/80  Pulse: 68  Temp: 97.8 F (36.6 C)  SpO2: 94%  Weight: 191 lb (86.6 kg)  Height: '5\' 7"'$  (1.702 m)   94% on RA BMI Readings from Last 3 Encounters:  11/24/18 29.91 kg/m  10/26/18 30.38 kg/m  10/12/18 30.81 kg/m   Wt Readings from Last 3 Encounters:  11/24/18 191 lb (86.6 kg)  10/26/18 186 lb 12.8 oz (84.7 kg)  10/12/18 188 lb (85.3 kg)     CBC    Component Value Date/Time   WBC 4.9 10/11/2018 1037   RBC 4.33 10/11/2018 1037   HGB 12.9 10/11/2018 1037   HGB 12.7 12/16/2017 1540   HCT 39.8 10/11/2018 1037   PLT 271 10/11/2018 1037   PLT 305 12/16/2017 1540   MCV 91.9 10/11/2018 1037   MCH 29.8 10/11/2018 1037   MCHC 32.4 10/11/2018 1037   RDW 13.3 10/11/2018 1037   LYMPHSABS 1.6 10/11/2018 1037   MONOABS 0.3 10/11/2018 1037   EOSABS 0.2 10/11/2018 1037   BASOSABS 0.0 10/11/2018 1037    Chest Imaging: No recent chest imaging   2016 CT chest with evidence of apical blebs.  Brought in by patient report. The patient's images have been independently reviewed by me.    11/04/2018 CT chest: Mild  subpleural reticulation and bronchiolectasis of the anterior left upper lobe and lingula consistent with minimal radiation-induced fibrosis. The patient's images have been independently reviewed  by me.    Pulmonary Functions Testing Results: PFT Results Latest Ref Rng & Units 11/24/2018  FVC-Pre L 3.18  FVC-Predicted Pre % 84  FVC-Post L 3.16  FVC-Predicted Post % 83  Pre FEV1/FVC % % 79  Post FEV1/FCV % % 82  FEV1-Pre L 2.51  FEV1-Predicted Pre % 85  FEV1-Post L 2.61  DLCO UNC% % 109  DLCO COR %Predicted % 119  TLC L 4.98  TLC % Predicted % 90  RV % Predicted % 97    FeNO: None   Pathology: None   Echocardiogram: None   Heart Catheterization: None     Assessment & Plan:     ICD-10-CM   1. Mild intermittent asthma without complication  Q00.37   2. Hormone receptor positive breast cancer, left ([place])  C50.912   3. H/O therapeutic radiation  Z92.3   4. Former smoker  Z87.891   5. SOB (shortness of breath)  R06.02 budesonide-formoterol (SYMBICORT) 80-4.5 MCG/ACT inhaler    DISCONTINUED: budesonide-formoterol (SYMBICORT) 80-4.5 MCG/ACT inhaler    Discussion:  58 year old female, longstanding history of smoking, approximately 20-pack-year history, quit 4 years ago, CT with apical blebs and possible early emphysema type changes.  CT scan recently for follow-up of post radiation to the left chest following breast cancer.  She does have some scarring and radiation-induced changes.  As for her mild intermittent symptoms and pulmonary function test today that reveal a DLCO of 109% predicted could consider mild asthma symptoms as being her cause.  We will change her from Anoro to Symbicort 80.  We will see how she does with this.  She also does not like using dry powder inhaler.  Patient to return to our clinic in 6 months. Continue use of Symbicort 80+ PRN albuterol.  Greater than 50% of this patient's 25-minute of visit was been face-to-face discussing above recommendations and treatment plan.  We also reviewed patient's CT scan as well as pulmonary function test completed today.   Current Outpatient Medications:  .  ergocalciferol (VITAMIN D2) 50000 UNITS capsule,  Take 50,000 Units by mouth 2 (two) times a week. On Thurs & Sun, Disp: , Rfl:  .  levothyroxine (SYNTHROID, LEVOTHROID) 137 MCG tablet, Take 150 mcg by mouth at bedtime. , Disp: , Rfl:  .  losartan-hydrochlorothiazide (HYZAAR) 50-12.5 MG tablet, Take 1 tablet by mouth daily., Disp: , Rfl:  .  umeclidinium-vilanterol (ANORO ELLIPTA) 62.5-25 MCG/INH AEPB, Inhale 1 puff into the lungs daily., Disp: 3 each, Rfl: 0 .  venlafaxine XR (EFFEXOR-XR) 37.5 MG 24 hr capsule, TAKE 1 CAPSULE BY MOUTH DAILY WITH BREAKFAST FOR 2 WEEKS & THEN MAY INCREASE TO 2 CAPSULES DAILY, Disp: 180 capsule, Rfl: 2 .  budesonide-formoterol (SYMBICORT) 80-4.5 MCG/ACT inhaler, Inhale 2 puffs into the lungs 2 (two) times daily., Disp: 1 Inhaler, Rfl: 6  Current Facility-Administered Medications:  .  0.9 %  sodium chloride infusion, 500 mL, Intravenous, Once, Irene Shipper, MD   Garner Nash, DO [facility] 11/24/2018 11:01 AM

## 2018-11-24 NOTE — Patient Instructions (Addendum)
Thank you for visiting Dr. Valeta Harms at [facility]. Today we recommend the following:  Meds ordered this encounter  Medications  . budesonide-formoterol (SYMBICORT) 80-4.5 MCG/ACT inhaler    Sig: Inhale 2 puffs into the lungs 2 (two) times daily.    Dispense:  1 Inhaler    Refill:  6    Order Specific Question:   Lot Number?    Answer:   [id number]    Order Specific Question:   Expiration Date?    Answer:   08/14/2019    Order Specific Question:   Manufacturer?    Answer:   [employer] [[id number]]    Order Specific Question:   Quantity    Answer:   1   Albuterol inhaler as needed for shortness of breath and wheezing   Return in about 6 months (around 05/24/2019).    Please do your part to reduce the spread of COVID-19.

## 2018-12-02 ENCOUNTER — Telehealth: Payer: Self-pay | Admitting: Adult Health

## 2018-12-02 NOTE — Telephone Encounter (Signed)
I COULD not reach patient regarding video visit change form 10/2 to 10/8 I will mail

## 2018-12-05 ENCOUNTER — Telehealth: Payer: Self-pay | Admitting: Adult Health

## 2018-12-05 NOTE — Telephone Encounter (Signed)
I could not reach patient regarding scp change to video visit. Mailbox has been full for every call placed

## 2018-12-13 ENCOUNTER — Encounter: Payer: Self-pay | Admitting: Gynecology

## 2018-12-19 ENCOUNTER — Telehealth: Payer: Self-pay | Admitting: Adult Health

## 2018-12-19 ENCOUNTER — Encounter: Payer: 59 | Admitting: Adult Health

## 2018-12-19 NOTE — Telephone Encounter (Signed)
I talk with patient regarding reschedule from 10/8 to 10/15

## 2018-12-22 ENCOUNTER — Inpatient Hospital Stay: Payer: 59 | Admitting: Adult Health

## 2018-12-28 ENCOUNTER — Telehealth: Payer: Self-pay | Admitting: Adult Health

## 2018-12-29 ENCOUNTER — Encounter: Payer: Self-pay | Admitting: Adult Health

## 2018-12-29 ENCOUNTER — Inpatient Hospital Stay: Payer: 59 | Attending: Oncology | Admitting: Adult Health

## 2018-12-29 DIAGNOSIS — Z170 Estrogen receptor positive status [ER+]: Secondary | ICD-10-CM

## 2018-12-29 DIAGNOSIS — C50412 Malignant neoplasm of upper-outer quadrant of left female breast: Secondary | ICD-10-CM | POA: Diagnosis not present

## 2018-12-29 NOTE — Addendum Note (Signed)
Addended by: Scot Dock on: 12/29/2018 04:53 PM   Modules accepted: Orders

## 2018-12-29 NOTE — Progress Notes (Signed)
SURVIVORSHIP VIRTUAL VISIT:  I connected with Bethany [name] on 12/29/18 at  3:30 PM EDT by my chart video and verified that I am speaking with the correct person using two identifiers.  I discussed the limitations, risks, security and privacy concerns of performing an evaluation and management service virtually and the availability of in person appointments. I also discussed with the patient that there may be a patient responsible charge related to this service. The patient expressed understanding and agreed to proceed.   BRIEF ONCOLOGIC HISTORY:  Oncology History  Malignant neoplasm of upper-outer quadrant of left breast in female, estrogen receptor positive ([place])   Initial Diagnosis   Malignant neoplasm of upper-outer quadrant of left breast in female, estrogen receptor positive ([place])   11/25/2017 Surgery   [place], [place] woman status post left breast upper outer quadrant lumpectomy 11/25/2017 for a pT1b pNX, stage Ia invasive ductal carcinoma, grade 1, estrogen and progesterone receptor positive, HER-2/neu negative, with an MIB-1 of 3%   11/25/2017 Oncotype testing   24/10%   12/09/2017 Surgery   status post left axillary lymph node sampling 12/09/2017, all 4 sentinel lymph nodes clear   01/27/2018 - 03/15/2018 Radiation Therapy   The patient initially received a dose of 50.4 Gy in 28 fractions to the left breast using whole-breast tangent fields. This was delivered using a 3-D conformal technique. The patient then received a boost to the seroma. This delivered an additional 10 Gy in 5 fractions using 10X, 6X photons with a Complex Isodose technique. The total dose was 60.4 Gy.   04/2018 -  Anti-estrogen oral therapy   '20mg'$  Tamoxifen daily, decreased to '10mg'$  in 07/2018     INTERVAL HISTORY:  Bethany [name] to review her survivorship care plan detailing her treatment course for breast cancer, as well as monitoring long-term side effects of that treatment, education regarding health maintenance,  screening, and overall wellness and health promotion.     Overall, Bethany [name] reports feeling quite well.  She stopped taking tamoxifen about 1 month ago. While on tamoxifen she had noted an increase in her ability to balance.  She also was experiencing leg cramps and difficulty sleeping.  Furthermore she had experienced falls, about 10-15.  She notes that since stopping tamoxifen everything has improved.  She is anxious about the adverse effects of anastrozole, but knows that she needs to take something.    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  She has undergone evaluation with Dr. Valeta Harms, and her breathing is improving.  She is due to see him again in about 6 months.    REVIEW OF SYSTEMS:  Review of Systems  Constitutional: Negative for appetite change, chills, fatigue, fever and unexpected weight change.  HENT:   Negative for hearing loss, lump/mass, sore throat and trouble swallowing.   Eyes: Negative for eye problems and icterus.  Respiratory: Negative for chest tightness, cough and shortness of breath.   Cardiovascular: Negative for chest pain, leg swelling and palpitations.  Gastrointestinal: Negative for abdominal distention, abdominal pain, constipation, diarrhea, nausea and vomiting.  Endocrine: Negative for hot flashes.  Genitourinary: Negative for difficulty urinating.   Musculoskeletal: Negative for arthralgias.  Skin: Negative for itching and rash.  Neurological: Negative for dizziness, extremity weakness, headaches and numbness.  Hematological: Negative for adenopathy. Does not bruise/bleed easily.  Psychiatric/Behavioral: Negative for depression. The patient is not nervous/anxious.   Breast: Denies any new nodularity, masses, tenderness, nipple changes, or nipple discharge.    ONCOLOGY TREATMENT  TEAM:  1. Surgeon:  Dr. Brantley Stage at [facility] 2. Medical Oncologist: Dr. Jana Hakim  3. Radiation Oncologist: Dr. Lisbeth Renshaw     PAST MEDICAL/SURGICAL HISTORY:  Past Medical History:  Diagnosis Date  . Anxiety   . Breast mass, left 11/2017   going thru radiation until 02/2018  . Cancer [facility])    left breast cancer/diagnosed in 10/2017/taking radiation treatment until 03/15/18  . COPD (chronic obstructive pulmonary disease) ([place])    beginning stages/small scar  . Dental crowns present   . History of hiatal hernia    no current med.  . Hypertension    states under control with med., has been on med. x 1 yr.  . Hypothyroidism   . MVP (mitral valve prolapse)   . Personal history of radiation therapy   . PONV (postoperative nausea and vomiting)   . Vitamin D deficiency    Past Surgical History:  Procedure Laterality Date  . ABDOMINAL HYSTERECTOMY     partial  . APPENDECTOMY    . AXILLARY SENTINEL NODE BIOPSY Left 12/09/2017   Procedure: AXILLARY SENTINEL NODE BIOPSY;  Surgeon: Erroll Luna, MD;  Location: [facility];  Service: General;  Laterality: Left;  . BREAST LUMPECTOMY Left 11/2017  . BREAST LUMPECTOMY WITH NEEDLE LOCALIZATION Left 11/25/2017   Procedure: LEFT BREAST LUMPECTOMY WITH NEEDLE LOCALIZATION;  Surgeon: Erroll Luna, MD;  Location: [facility];  Service: General;  Laterality: Left;  . COLONOSCOPY WITH PROPOFOL  10/03/2015  . LAPAROSCOPIC APPENDECTOMY N/A 10/03/2015   Procedure: APPENDECTOMY LAPAROSCOPIC;  Surgeon: Rolm Bookbinder, MD;  Location: [facility];  Service: General;  Laterality: N/A;  . TOTAL THYROIDECTOMY  04/02/2003     ALLERGIES:  Allergies  Allergen Reactions  . Oxycodone Nausea And Vomiting  . Latex Rash  . Sulfa Antibiotics     hives     CURRENT MEDICATIONS:  Outpatient Encounter Medications as of 12/29/2018  Medication Sig Note  . albuterol (VENTOLIN HFA) 108 (90 Base) MCG/ACT inhaler Inhale 2 puffs into the lungs every 6 (six) hours as needed for wheezing or shortness of breath.   . budesonide-formoterol (SYMBICORT) 80-4.5 MCG/ACT  inhaler Inhale 2 puffs into the lungs 2 (two) times daily.   . ergocalciferol (VITAMIN D2) 50000 UNITS capsule Take 50,000 Units by mouth 2 (two) times a week. On Thurs & Sun 10/02/2015: SUNDAYS AND THURSDAYS  . levothyroxine (SYNTHROID, LEVOTHROID) 137 MCG tablet Take 150 mcg by mouth at bedtime.    Marland Kitchen losartan-hydrochlorothiazide (HYZAAR) 50-12.5 MG tablet Take 1 tablet by mouth daily.   Marland Kitchen umeclidinium-vilanterol (ANORO ELLIPTA) 62.5-25 MCG/INH AEPB Inhale 1 puff into the lungs daily.   Marland Kitchen venlafaxine XR (EFFEXOR-XR) 37.5 MG 24 hr capsule TAKE 1 CAPSULE BY MOUTH DAILY WITH BREAKFAST FOR 2 WEEKS & THEN MAY INCREASE TO 2 CAPSULES DAILY    Facility-Administered Encounter Medications as of 12/29/2018  Medication  . 0.9 %  sodium chloride infusion     ONCOLOGIC FAMILY HISTORY:  Family History  Problem Relation Age of Onset  . Hypertension Father   . Heart disease Father   . Non-Hodgkin's lymphoma Father   . Hypertension Brother   . Heart disease Paternal Grandfather   . Colon cancer Other        great maternal aunt   . Colon polyps Mother   . Stomach cancer Neg Hx   . Rectal cancer Neg Hx   . Esophageal cancer Neg Hx      GENETIC COUNSELING/TESTING: Not at this time  SOCIAL HISTORY:  Social History   Socioeconomic History  . Marital status: Married    Spouse name: Not on file  . Number of children: Not on file  . Years of education: Not on file  . Highest education level: Not on file  Occupational History  . Not on file  Social Needs  . Financial resource strain: Not on file  . Food insecurity    Worry: Not on file    Inability: Not on file  . Transportation needs    Medical: No    Non-medical: No  Tobacco Use  . Smoking status: Former Smoker    Quit date: 08/14/2014    Years since quitting: 4.3  . Smokeless tobacco: Never Used  Substance and Sexual Activity  . Alcohol use: Yes    Comment: rarely  . Drug use: No  . Sexual activity: Yes    Partners: Male   Lifestyle  . Physical activity    Days per week: Not on file    Minutes per session: Not on file  . Stress: Not on file  Relationships  . Social Herbalist on phone: Not on file    Gets together: Not on file    Attends religious service: Not on file    Active member of club or organization: Not on file    Attends meetings of clubs or organizations: Not on file    Relationship status: Not on file  . Intimate partner violence    Fear of current or ex partner: No    Emotionally abused: No    Physically abused: No    Forced sexual activity: No  Other Topics Concern  . Not on file  Social History Narrative  . Not on file     OBSERVATIONS/OBJECTIVE:  Patient appears well.  She is in no apparent distress.  Mood and behavior are normal..  Skin visualized is normal. Breathing is non labored.    LABORATORY DATA:  None for this visit.  DIAGNOSTIC IMAGING:  None for this visit.      ASSESSMENT AND PLAN:  Ms.. [name] is a pleasant 58 y.o. female with Stage IA left breast invasive ductal carcinoma, ER+/PR+/HER2-, diagnosed in 10/2017, treated with lumpectomy, adjuvant radiation therapy, and anti-estrogen therapy with Tamoxifen, that she was unable to tolerate.  She presents to the Survivorship Clinic for our initial meeting and routine follow-up post-completion of treatment for breast cancer.    1. Stage IA left breast cancer:  Bethany [name] is continuing to recover from definitive treatment for breast cancer. She will follow-up with her medical oncologist, Dr. Jana Hakim in 05/2019 with history and physical exam per surveillance protocol.  Her mammogram is due 10/2019. Today, a comprehensive survivorship care plan and treatment summary was reviewed with the patient today detailing her breast cancer diagnosis, treatment course, potential late/long-term effects of treatment, appropriate follow-up care with recommendations for the future, and patient education resources.  A copy of this  summary, along with a letter will be sent to the patient's primary care provider via mail/fax/In Basket message after today's visit.    2.  Inability to tolerate Tamoxifen: She has stopped tamoxifen.  We reviewed that with her breast cancer, her risk could be as high as 20% without antiestrogen therapy.  She will try Anastrozole.  Since she is 32 and s/p hysterectomy, she will come in for lab only to check her menopausal status.  Once we ensure she is indeed post menopausal, we will then write  for Anastrozole.  3. Bone health:  Given Bethany [name] age/history of breast cancer and her current treatment regimen including anti-estrogen therapy with Anastrozole, she is at risk for bone demineralization.  Her last DEXA scan was 10/2017, which showed osteopenia with a t score of -1.4.  Due to the rate of decline, she was recommended by her PCP to have a repeat bone density in 10/2018, which she has not yet done because of [place].  She will go ahead and get this done.  In the meantime, she was encouraged to increase her consumption of foods rich in calcium, as well as increase her weight-bearing activities.  She was given education on specific activities to promote bone health.  We also discussed bisphosphonate therapy in detail.  4. Cancer screening:  Due to Bethany [name]'s history and her age, she should receive screening for skin cancers, colon cancer, and gynecologic cancers.  The information and recommendations are listed on the patient's comprehensive care plan/treatment summary and were reviewed in detail with the patient.    5. Health maintenance and wellness promotion: Bethany [name] was encouraged to consume 5-7 servings of fruits and vegetables per day. We reviewed the "Nutrition Rainbow" handout, as well as the handout "Take Control of Your Health and Reduce Your Cancer Risk" from the [place].  She was also encouraged to engage in moderate to vigorous exercise for 30 minutes per day most days of  the week. We discussed the [employer] fitness program, which is designed for cancer survivors to help them become more physically fit after cancer treatments.  She was instructed to limit her alcohol consumption and continue to abstain from tobacco use.     6. Support services/counseling: It is not uncommon for this period of the patient's cancer care trajectory to be one of many emotions and stressors.  We discussed how this can be increasingly difficult during the times of quarantine and social distancing due to the COVID-19 pandemic.   She was given information regarding our available services and encouraged to contact me with any questions or for help enrolling in any of our support group/programs.    Follow up instructions:    -Return to cancer center in 05/2019  -Mammogram due in 10/2019 -Follow up with surgery 01/2019 -Bone density 12/2018 -She is welcome to return back to the Survivorship Clinic at any time; no additional follow-up needed at this time.  -Consider referral back to survivorship as a long-term survivor for continued surveillance  The patient was provided an opportunity to ask questions and all were answered. The patient agreed with the plan and demonstrated an understanding of the instructions.   The patient was advised to call back or seek an in-person evaluation if the symptoms worsen or if the condition fails to improve as anticipated.   I provided 25 minutes of face-to-face video visit time during this encounter, and > 50% was spent counseling as documented under my assessment & plan.  Scot Dock, NP

## 2019-01-02 ENCOUNTER — Telehealth: Payer: Self-pay | Admitting: Adult Health

## 2019-01-02 NOTE — Telephone Encounter (Signed)
Called pt per 10/15 sch message - no answer and vmail full - unable to leave message for an appt .  msg sent to RN and Delice Bison to let them know

## 2019-01-03 ENCOUNTER — Ambulatory Visit: Payer: 59 | Admitting: Obstetrics & Gynecology

## 2019-01-25 ENCOUNTER — Ambulatory Visit: Payer: 59 | Admitting: Obstetrics & Gynecology

## 2019-02-07 ENCOUNTER — Encounter: Payer: Self-pay | Admitting: Obstetrics & Gynecology

## 2019-02-07 ENCOUNTER — Ambulatory Visit (INDEPENDENT_AMBULATORY_CARE_PROVIDER_SITE_OTHER): Payer: 59 | Admitting: Obstetrics & Gynecology

## 2019-02-07 ENCOUNTER — Other Ambulatory Visit: Payer: Self-pay

## 2019-02-07 VITALS — BP 150/88 | Wt 195.0 lb

## 2019-02-07 DIAGNOSIS — Z6830 Body mass index (BMI) 30.0-30.9, adult: Secondary | ICD-10-CM

## 2019-02-07 DIAGNOSIS — Z23 Encounter for immunization: Secondary | ICD-10-CM | POA: Diagnosis not present

## 2019-02-07 DIAGNOSIS — I10 Essential (primary) hypertension: Secondary | ICD-10-CM

## 2019-02-07 DIAGNOSIS — E6609 Other obesity due to excess calories: Secondary | ICD-10-CM | POA: Diagnosis not present

## 2019-02-07 DIAGNOSIS — Z713 Dietary counseling and surveillance: Secondary | ICD-10-CM

## 2019-02-07 DIAGNOSIS — N393 Stress incontinence (female) (male): Secondary | ICD-10-CM | POA: Diagnosis not present

## 2019-02-07 DIAGNOSIS — E66811 Obesity, class 1: Secondary | ICD-10-CM

## 2019-02-07 NOTE — Progress Notes (Signed)
    Sheritta Fiest Scherger November 26, 1960 [id number]        58 y.o.  [id number] Married  RP: Obesity with difficulty loosing weight  HPI: Weight gain from 188 Lbs 10/12/18 to 195 Lbs today.  Not currently taking the Phentermine.  cHTN on Hyzaar 50/12.5 daily with BPs elevated at appointments, today 150/88, patient reports BPs 130's/80's at home.  Patient is on Synthroid for Hypothyroidism and on Effexor XR for Depression.  Feels that she gains weight easily when deviating from her low carb diet.  Not exercising consistently.  BMs normal, no constipation.  Complains of stress urinary incontinence.   OB History  Gravida Para Term Preterm AB Living  3 3 3     3   SAB TAB Ectopic Multiple Live Births               # Outcome Date GA Lbr Len/2nd Weight Sex Delivery Anes PTL Lv  3 Term           2 Term           1 Term             Past medical history,surgical history, problem list, medications, allergies, family history and social history were all reviewed and documented in the EPIC chart.   Directed ROS with pertinent positives and negatives documented in the history of present illness/assessment and plan.  Exam:  Vitals:   02/07/19 0940  BP: (!) 150/88  Weight: 195 lb (88.5 kg)   General appearance:  Normal  Gyn exam deferred   Assessment/Plan:  58 y.o. [id number]   1. Weight loss counseling, encounter for Difficulty losing weight with actual weight gain of 7 pounds since the end of July 2020.  Contraindication to continue on phentermine with chronic hypertension which does not seem to be completely controlled.  Will verify a TSH today to make sure that her Synthroid dosage is adequate for her hypothyroidism.  Patient will consider stopping Effexor Exar if no symptoms of depression are present.  Recommend a consistent lower calorie/carb diet such as [employer].  Intermittent fasting discussed again today.  Would probably be beneficial to join Weight Watchers.  Recommend aerobic activities 5  times a week and small weight lifting every 2 days. - TSH  2. Class 1 obesity due to excess calories without serious comorbidity with body mass index (BMI) of 30.0 to 30.9 in adult As above. - TSH  3. Chronic hypertension Recommend reassessment of chronic hypertension treatment with family physician.  4. SUI (stress urinary incontinence, female) Refer to urologist for sling procedure.  5. Need for immunization against influenza - Flu Vaccine QUAD 36+ mos IM  Counseling on above issues and coordination of care more than 50% for 25 minutes.  [name] MD, 10:06 AM 02/07/2019

## 2019-02-08 ENCOUNTER — Encounter: Payer: Self-pay | Admitting: Obstetrics & Gynecology

## 2019-02-08 LAB — TSH: TSH: 1.91 mIU/L (ref 0.40–4.50)

## 2019-02-08 NOTE — Patient Instructions (Signed)
1. Weight loss counseling, encounter for Difficulty losing weight with actual weight gain of 7 pounds since the end of July 2020.  Contraindication to continue on phentermine with chronic hypertension which does not seem to be completely controlled.  Will verify a TSH today to make sure that her Synthroid dosage is adequate for her hypothyroidism.  Patient will consider stopping Effexor Exar if no symptoms of depression are present.  Recommend a consistent lower calorie/carb diet such as [employer].  Intermittent fasting discussed again today.  Would probably be beneficial to join Weight Watchers.  Recommend aerobic activities 5 times a week and small weight lifting every 2 days. - TSH  2. Class 1 obesity due to excess calories without serious comorbidity with body mass index (BMI) of 30.0 to 30.9 in adult As above. - TSH  3. Chronic hypertension Recommend reassessment of chronic hypertension treatment with family physician.  4. SUI (stress urinary incontinence, female) Refer to urologist for sling procedure.  5. Need for immunization against influenza - Flu Vaccine QUAD 36+ mos IM  Bethany [name], it was a pleasure seeing you today!  I will inform you of your results as soon as they are available.

## 2019-02-13 ENCOUNTER — Telehealth: Payer: Self-pay | Admitting: *Deleted

## 2019-02-13 NOTE — Telephone Encounter (Signed)
Referral placed in [facility] and notes faxed to [facility] they will call to schedule. Patient aware of this as well.

## 2019-02-13 NOTE — Telephone Encounter (Signed)
-----   Message from [name], MD sent at 02/07/2019 10:07 AM EST ----- Regarding: Refer to Urology Worsening Stress Urinary Incontinence.  Wants to proceed with Sling Procedure.  Refer to [employer].

## 2019-02-15 ENCOUNTER — Telehealth: Payer: Self-pay

## 2019-02-15 NOTE — Telephone Encounter (Signed)
Patient called stating Dr. Dellis Filbert was going to refer her to urologist but she has not heard anything.  I called her back but her voice mail box was full.  She called later stating she missed a call and I did inform her her voice mail box full and [facility] may have tried to call her. I told her referral is in the system and [facility] sent records to.  I provided her with the phone number and told her it will be fine for her to call to schedule the appointment with them.

## 2019-02-17 NOTE — Telephone Encounter (Signed)
patient scheduled on 03/09/19 with Dr. Gloriann Loan.

## 2019-02-22 NOTE — Telephone Encounter (Signed)
Tried to call patient but mailbox is full and I cannot leave a message.

## 2019-03-17 HISTORY — PX: BREAST EXCISIONAL BIOPSY: SUR124

## 2019-03-23 ENCOUNTER — Telehealth: Payer: Self-pay | Admitting: *Deleted

## 2019-03-23 NOTE — Telephone Encounter (Signed)
This RN returned call to pt per her VM stating she has had intermittent " shortness of breath and feeling kinda heavy in my chest "  " I think since I had the radiation fibrosis last year that maybe it is affecting my heart" " I need to see someone or get a referral to a cardiologist "  This RN informed pt above is best managed by seeing her primary MD first- due to need for  evaluation as well as appropriate referral process per her insurance.  " I thought since the last time your office referred me to the pulmonary MD your office could do it to a cardiologist"  This RN again reiterated of need for appropriate evaluation by her primary MD- due to may be related to something else. Presently she   This RN verified pt has a primary MD ( as noted on MD dictation ) and again highly recommended for her to contact her primary MD.  Pt verbalized understanding.

## 2019-04-03 ENCOUNTER — Encounter (HOSPITAL_COMMUNITY): Payer: Self-pay

## 2019-04-03 ENCOUNTER — Emergency Department (HOSPITAL_COMMUNITY): Payer: 59

## 2019-04-03 ENCOUNTER — Other Ambulatory Visit: Payer: Self-pay

## 2019-04-03 ENCOUNTER — Emergency Department (HOSPITAL_COMMUNITY)
Admission: EM | Admit: 2019-04-03 | Discharge: 2019-04-03 | Disposition: A | Discharge status: Home / Self Care | Payer: 59 | Attending: Emergency Medicine | Admitting: Emergency Medicine

## 2019-04-03 DIAGNOSIS — Z79899 Other long term (current) drug therapy: Secondary | ICD-10-CM | POA: Insufficient documentation

## 2019-04-03 DIAGNOSIS — Z7982 Long term (current) use of aspirin: Secondary | ICD-10-CM | POA: Insufficient documentation

## 2019-04-03 DIAGNOSIS — Z87891 Personal history of nicotine dependence: Secondary | ICD-10-CM | POA: Diagnosis not present

## 2019-04-03 DIAGNOSIS — I10 Essential (primary) hypertension: Secondary | ICD-10-CM | POA: Diagnosis not present

## 2019-04-03 DIAGNOSIS — J449 Chronic obstructive pulmonary disease, unspecified: Secondary | ICD-10-CM | POA: Insufficient documentation

## 2019-04-03 DIAGNOSIS — E039 Hypothyroidism, unspecified: Secondary | ICD-10-CM | POA: Insufficient documentation

## 2019-04-03 DIAGNOSIS — I2510 Atherosclerotic heart disease of native coronary artery without angina pectoris: Secondary | ICD-10-CM | POA: Insufficient documentation

## 2019-04-03 DIAGNOSIS — Z91040 Latex allergy status: Secondary | ICD-10-CM | POA: Insufficient documentation

## 2019-04-03 DIAGNOSIS — Z20822 Contact with and (suspected) exposure to covid-19: Secondary | ICD-10-CM | POA: Diagnosis not present

## 2019-04-03 DIAGNOSIS — R0789 Other chest pain: Secondary | ICD-10-CM | POA: Insufficient documentation

## 2019-04-03 HISTORY — DX: Atherosclerotic heart disease of native coronary artery without angina pectoris: I25.10

## 2019-04-03 HISTORY — DX: Chronic and other pulmonary manifestations due to radiation: J70.1

## 2019-04-03 LAB — CBC
HCT: 40.7 % (ref 36.0–46.0)
Hemoglobin: 13 g/dL (ref 12.0–15.0)
MCH: 30.2 pg (ref 26.0–34.0)
MCHC: 31.9 g/dL (ref 30.0–36.0)
MCV: 94.7 fL (ref 80.0–100.0)
Platelets: 282 10*3/uL (ref 150–400)
RBC: 4.3 MIL/uL (ref 3.87–5.11)
RDW: 12.8 % (ref 11.5–15.5)
WBC: 5.5 10*3/uL (ref 4.0–10.5)
nRBC: 0 % (ref 0.0–0.2)

## 2019-04-03 LAB — HEPATIC FUNCTION PANEL
ALT: 27 U/L (ref 0–44)
AST: 24 U/L (ref 15–41)
Albumin: 4 g/dL (ref 3.5–5.0)
Alkaline Phosphatase: 63 U/L (ref 38–126)
Bilirubin, Direct: 0.1 mg/dL (ref 0.0–0.2)
Indirect Bilirubin: 0.3 mg/dL (ref 0.3–0.9)
Total Bilirubin: 0.4 mg/dL (ref 0.3–1.2)
Total Protein: 7 g/dL (ref 6.5–8.1)

## 2019-04-03 LAB — BASIC METABOLIC PANEL
Anion gap: 7 (ref 5–15)
BUN: 14 mg/dL (ref 6–20)
CO2: 29 mmol/L (ref 22–32)
Calcium: 9 mg/dL (ref 8.9–10.3)
Chloride: 104 mmol/L (ref 98–111)
Creatinine, Ser: 0.76 mg/dL (ref 0.44–1.00)
GFR calc Af Amer: >60 mL/min (ref >60)
GFR calc non Af Amer: >60 mL/min (ref >60)
Glucose, Bld: 100 mg/dL — ABNORMAL HIGH (ref 70–99)
Potassium: 3.9 mmol/L (ref 3.5–5.1)
Sodium: 140 mmol/L (ref 135–145)

## 2019-04-03 LAB — POC SARS CORONAVIRUS 2 AG -  ED: SARS Coronavirus 2 Ag: NEGATIVE

## 2019-04-03 LAB — TROPONIN I (HIGH SENSITIVITY)
Troponin I (High Sensitivity): 4 ng/L (ref <18)
Troponin I (High Sensitivity): 6 ng/L (ref <18)

## 2019-04-03 LAB — LIPASE, BLOOD: Lipase: 26 U/L (ref 11–51)

## 2019-04-03 LAB — D-DIMER, QUANTITATIVE: D-Dimer, Quant: 0.4 ug/mL-FEU (ref 0.00–0.50)

## 2019-04-03 LAB — I-STAT BETA HCG BLOOD, ED (MC, WL, AP ONLY): I-stat hCG, quantitative: <5 m[IU]/mL (ref <5)

## 2019-04-03 IMAGING — CR DG CHEST 2V
2 series · 2 of 2 positions shown · non-contrast
Comparison: CT chest [DATE], chest radiograph [DATE]

CLINICAL DATA: Chest pain. Additional history provided by
technologist: Intermittent chest pain for 2 weeks, worse for the
past 2-3 days.

EXAM:
CHEST - 2 VIEW

[w chest pa]
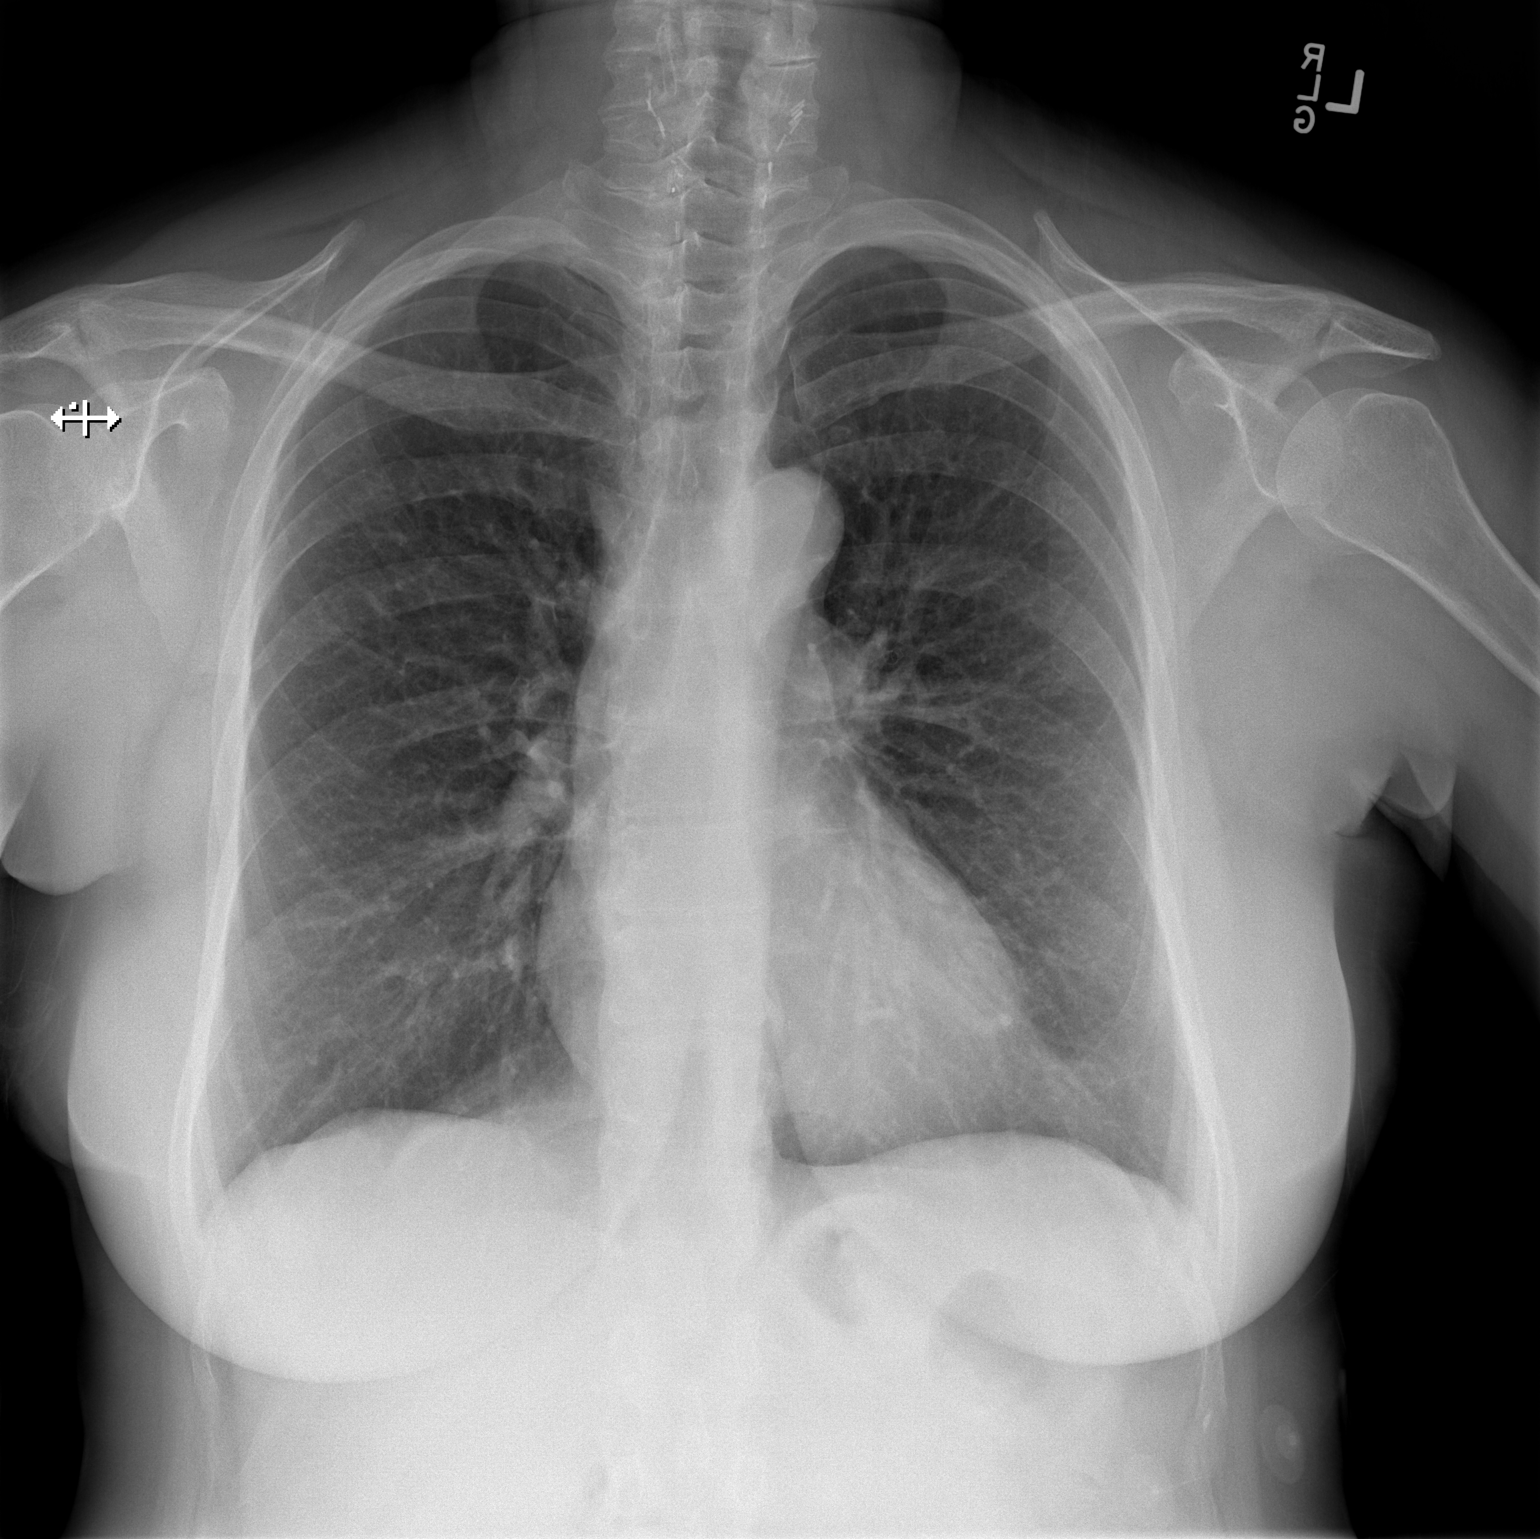

[w chest lat]
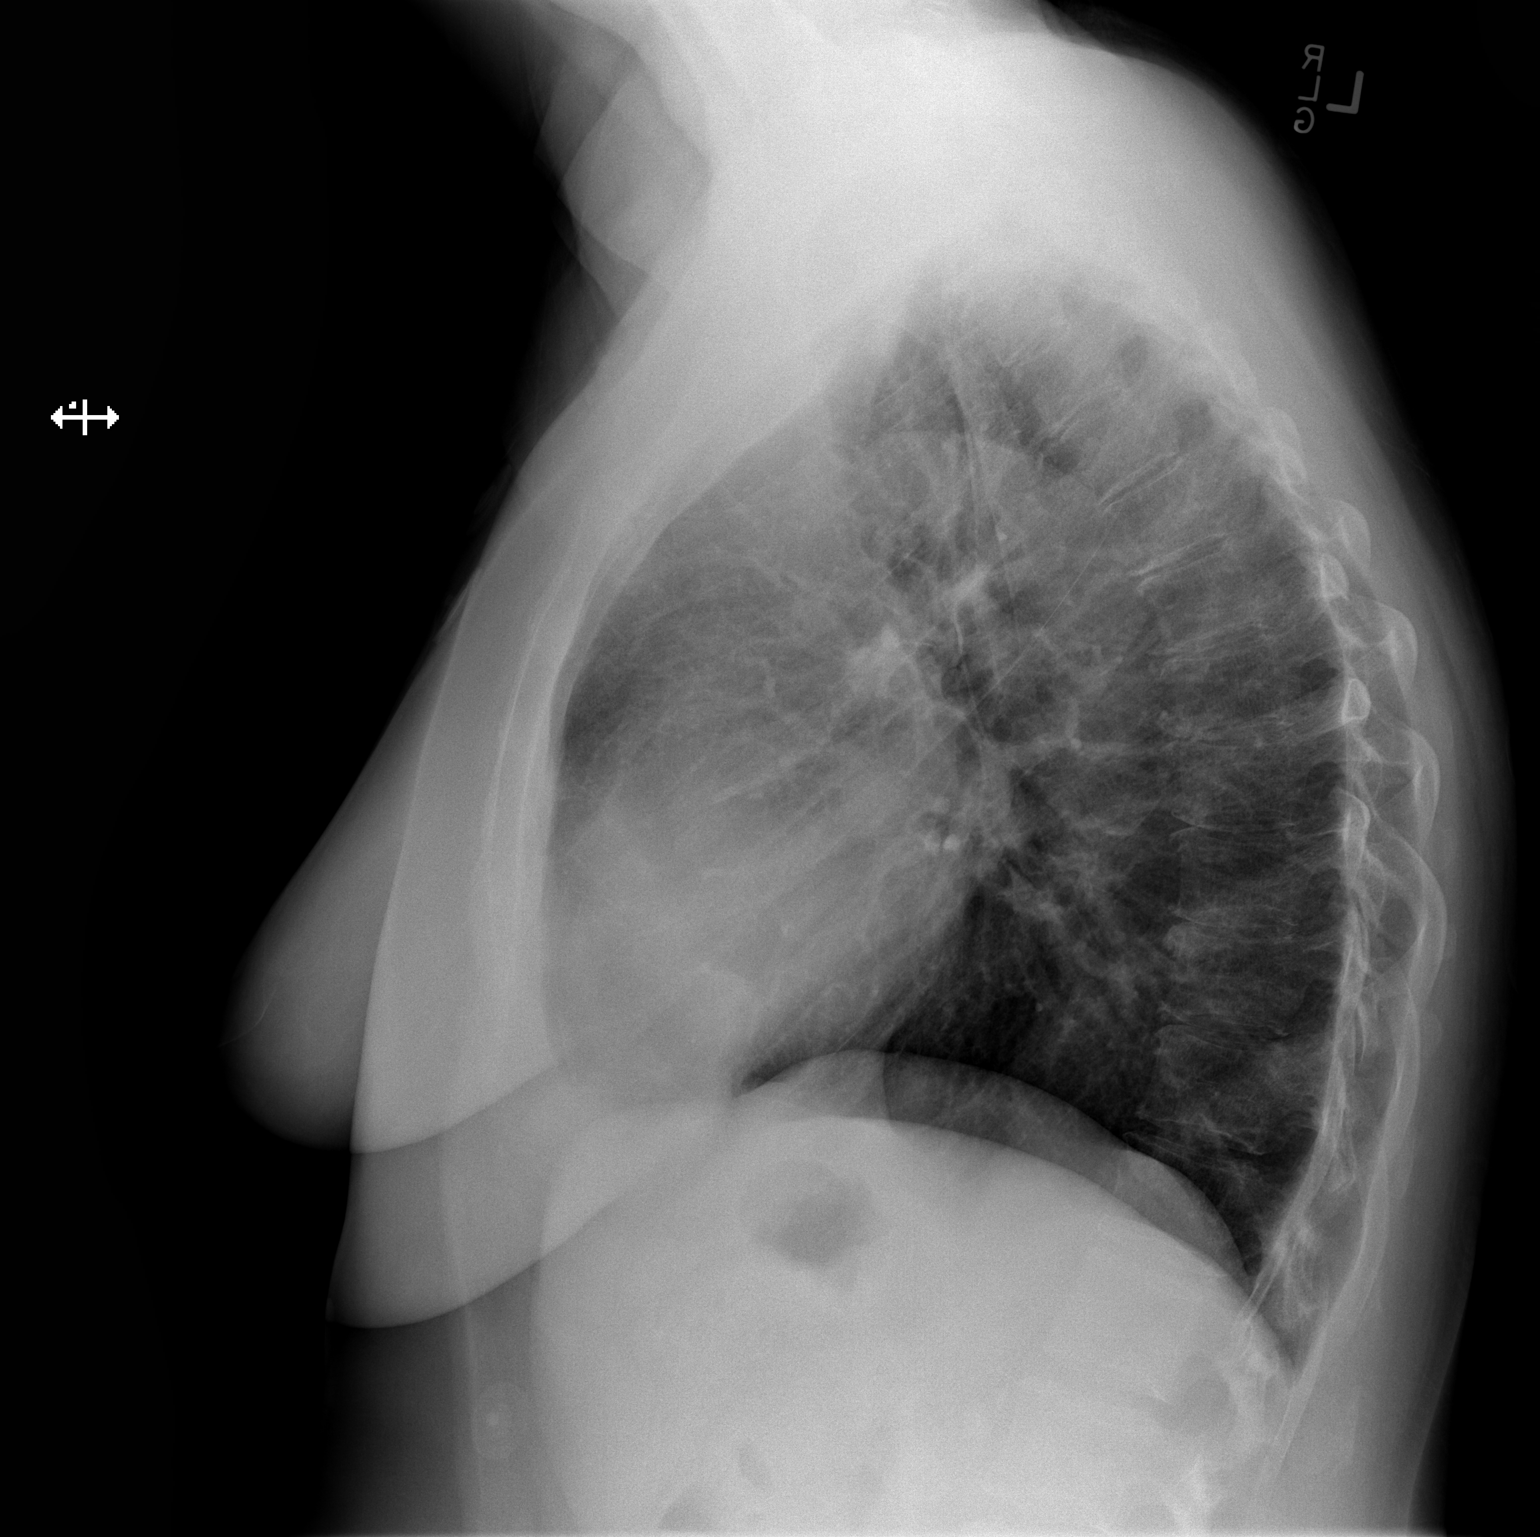

[2 of 2 positions shown; findings below may reference images not displayed]

FINDINGS: Heart size within normal limits. No evidence of airspace
consolidation. No pleural effusion or pneumothorax. No acute bony
abnormality. Thoracic spondylosis. Surgical clips project in the
region of the lower neck.
IMPRESSION: No evidence of acute cardiopulmonary abnormality.

## 2019-04-03 MED ORDER — SODIUM CHLORIDE 0.9% FLUSH: 3.0000 mL | Freq: Once | INTRAVENOUS | Status: DC

## 2019-04-03 NOTE — ED Provider Notes (Signed)
[place] DEPT Provider Note   CSN: [id number] Arrival date & time: 04/03/19  [id number]     History Chief Complaint  Patient presents with  . Chest Pain  . Shortness of Breath    Bethany [name] is a 59 y.o. female.  The history is provided by the patient.  Chest Pain Pain location:  Substernal area Pain quality: burning   Pain radiates to:  Does not radiate Pain severity:  Mild Onset quality:  Gradual Duration:  2 weeks Timing:  Intermittent Progression:  Waxing and waning Chronicity:  New Context: breathing and at rest   Relieved by:  Nothing Worsened by:  Nothing Associated symptoms: shortness of breath   Associated symptoms: no abdominal pain, no back pain, no cough, no fever, no nausea, no palpitations and no vomiting   Risk factors: hypertension   Risk factors: no coronary artery disease (actually does not have CAD), no diabetes mellitus and no high cholesterol (no medications)        Past Medical History:  Diagnosis Date  . Anxiety   . Breast mass, left 11/2017   going thru radiation until 02/2018  . CAD (coronary artery disease)   . Cancer [facility])    left breast cancer/diagnosed in 10/2017/taking radiation treatment until 03/15/18  . COPD (chronic obstructive pulmonary disease) ([place])    beginning stages/small scar  . Dental crowns present   . History of hiatal hernia    no current med.  . Hypertension    states under control with med., has been on med. x 1 yr.  . Hypothyroidism   . MVP (mitral valve prolapse)   . Personal history of radiation therapy   . PONV (postoperative nausea and vomiting)   . 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 Vitamin D deficiency     Patient Active Problem List   Diagnosis Date Noted  . History of thyroid cancer 12/15/2017  . 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 Ischemic chest pain ([place])   . Chest pain 06/19/2014  . Malignant neoplasm of upper-outer quadrant of left breast in female, estrogen  receptor positive ([place])   . Vitamin D deficiency   . Urinary incontinence   . SYNCOPE AND COLLAPSE 12/25/2008    Past Surgical History:  Procedure Laterality Date  . ABDOMINAL HYSTERECTOMY     partial  . APPENDECTOMY    . AXILLARY SENTINEL NODE BIOPSY Left 12/09/2017   Procedure: AXILLARY SENTINEL NODE BIOPSY;  Surgeon: Erroll Luna, MD;  Location: [place];  Service: General;  Laterality: Left;  . BREAST LUMPECTOMY Left 11/2017  . BREAST LUMPECTOMY WITH NEEDLE LOCALIZATION Left 11/25/2017   Procedure: LEFT BREAST LUMPECTOMY WITH NEEDLE LOCALIZATION;  Surgeon: Erroll Luna, MD;  Location: [place];  Service: General;  Laterality: Left;  . COLONOSCOPY WITH PROPOFOL  10/03/2015  . LAPAROSCOPIC APPENDECTOMY N/A 10/03/2015   Procedure: APPENDECTOMY LAPAROSCOPIC;  Surgeon: Rolm Bookbinder, MD;  Location: [place];  Service: General;  Laterality: N/A;  . TOTAL THYROIDECTOMY  04/02/2003     OB History    Gravida  3   Para  3   Term  3   Preterm      AB      Living  3     SAB      TAB      Ectopic      Multiple      Live Births  Family History  Problem Relation Age of Onset  . Hypertension Father   . Heart disease Father   . Non-Hodgkin's lymphoma Father   . Hypertension Brother   . Heart disease Paternal Grandfather   . Colon cancer Other        great maternal aunt   . Colon polyps Mother   . Stomach cancer Neg Hx   . Rectal cancer Neg Hx   . Esophageal cancer Neg Hx     Social History   Tobacco Use  . Smoking status: Former Smoker    Quit date: 08/14/2014    Years since quitting: 4.6  . Smokeless tobacco: Never Used  Substance Use Topics  . Alcohol use: Yes    Comment: rarely  . Drug use: No    Home Medications Prior to Admission medications   Medication Sig Start Date End Date Taking? Authorizing Provider  acetaminophen (TYLENOL) 325 MG tablet Take 975 mg by mouth every 6 (six) hours as needed  for mild pain or headache.   Yes [provider]  albuterol (VENTOLIN HFA) 108 (90 Base) MCG/ACT inhaler Inhale 2 puffs into the lungs every 6 (six) hours as needed for wheezing or shortness of breath. 11/24/18  Yes Icard, Octavio Graves, DO  aspirin EC 81 MG tablet Take 81 mg by mouth daily.   Yes [provider]  ergocalciferol (VITAMIN D2) 50000 UNITS capsule Take 50,000 Units by mouth once a week. On Thurs & Sun   Yes [provider]  ibuprofen (ADVIL) 200 MG tablet Take 600 mg by mouth every 6 (six) hours as needed for fever, headache or moderate pain.   Yes [provider]  levothyroxine (SYNTHROID, LEVOTHROID) 137 MCG tablet Take 137 mcg by mouth at bedtime.    Yes [provider]  losartan-hydrochlorothiazide (HYZAAR) 50-12.5 MG tablet Take 1 tablet by mouth daily. 10/21/18  Yes [provider]  Multiple Vitamin (MULTIVITAMIN WITH MINERALS) TABS tablet Take 1 tablet by mouth daily.   Yes [provider]  omeprazole (PRILOSEC) 20 MG capsule Take 20 mg by mouth daily as needed (acid reflux).   Yes [provider]  venlafaxine XR (EFFEXOR-XR) 37.5 MG 24 hr capsule TAKE 1 CAPSULE BY MOUTH DAILY WITH BREAKFAST FOR 2 WEEKS & THEN MAY INCREASE TO 2 CAPSULES DAILY Patient taking differently: Take 37.5 mg by mouth daily with breakfast.  09/30/18  Yes Magrinat, Virgie Dad, MD  budesonide-formoterol (SYMBICORT) 80-4.5 MCG/ACT inhaler Inhale 2 puffs into the lungs 2 (two) times daily. Patient not taking: Reported on 04/03/2019 11/24/18   Icard, Octavio Graves, DO  umeclidinium-vilanterol (ANORO ELLIPTA) 62.5-25 MCG/INH AEPB Inhale 1 puff into the lungs daily. Patient not taking: Reported on 04/03/2019 10/26/18   June Leap L, DO    Allergies    Oxycodone, Latex, and Sulfa antibiotics  Review of Systems   Review of Systems  Constitutional: Negative for chills and fever.  HENT: Negative for ear pain and sore throat.   Eyes: Negative for pain  and visual disturbance.  Respiratory: Positive for shortness of breath. Negative for cough.   Cardiovascular: Positive for chest pain. Negative for palpitations.  Gastrointestinal: Negative for abdominal pain, nausea and vomiting.  Genitourinary: Negative for dysuria and hematuria.  Musculoskeletal: Negative for arthralgias and back pain.  Skin: Negative for color change and rash.  Neurological: Negative for seizures and syncope.  All other systems reviewed and are negative.   Physical Exam Updated Vital Signs  ED Triage Vitals [04/03/19 0736]  Enc Vitals Group     BP (!) 144/94     Pulse Rate 63     Resp 15     Temp 97.6 F (36.4 C)     Temp Source Oral     SpO2 98 %     Weight 194 lb (88 kg)     Height 5\' 6"  (1.676 m)     Head Circumference      Peak Flow      Pain Score 0     Pain Loc      Pain Edu?      Excl. in [place]?     Physical Exam Vitals and nursing note reviewed.  Constitutional:      General: She is not in acute distress.    Appearance: She is well-developed.  HENT:     Head: Normocephalic and atraumatic.  Eyes:     Conjunctiva/sclera: Conjunctivae normal.     Pupils: Pupils are equal, round, and reactive to light.  Cardiovascular:     Rate and Rhythm: Normal rate and regular rhythm.     Pulses:          Radial pulses are 2+ on the right side and 2+ on the left side.     Heart sounds: No murmur.  Pulmonary:     Effort: Pulmonary effort is normal. No respiratory distress.     Breath sounds: Normal breath sounds. No decreased breath sounds, wheezing, rhonchi or rales.  Abdominal:     Palpations: Abdomen is soft.     Tenderness: There is no abdominal tenderness.  Musculoskeletal:        General: Normal range of motion.     Cervical back: Normal range of motion and neck supple.     Right lower leg: No edema.  Skin:    General: Skin is warm and dry.     Capillary Refill: Capillary refill takes less than 2 seconds.  Neurological:     General: No focal  deficit present.     Mental Status: She is alert.  Psychiatric:        Mood and Affect: Mood normal.     ED Results / Procedures / Treatments   Labs (all labs ordered are listed, but only abnormal results are displayed) Labs Reviewed  BASIC METABOLIC PANEL - Abnormal; Notable for the following components:      Result Value   Glucose, Bld 100 (*)    All other components within normal limits  CBC  HEPATIC FUNCTION PANEL  LIPASE, BLOOD  D-DIMER, QUANTITATIVE (NOT AT [facility])  I-STAT BETA HCG BLOOD, ED ([facility], [facility], [facility] ONLY)  POC SARS CORONAVIRUS 2 AG -  ED  TROPONIN I (HIGH SENSITIVITY)  TROPONIN I (HIGH SENSITIVITY)    EKG EKG Interpretation  Date/Time:  Monday April 03 2019 07:32:17 EST Ventricular Rate:  64 PR Interval:    QRS Duration: 96 QT Interval:  419 QTC Calculation: 433 R Axis:   40 Text Interpretation: Sinus rhythm Low voltage, precordial leads Abnormal R-wave progression, early transition Confirmed by Lennice Sites [phone]) on 04/03/2019 7:34:18 AM   Radiology DG Chest 2 View  Result Date: 04/03/2019 CLINICAL DATA:  Chest pain. Additional history provided by technologist: Intermittent chest pain for 2 weeks, worse for the past 2-3 days. EXAM: CHEST - 2 VIEW COMPARISON:  CT chest 11/04/2018, chest radiograph 06/19/2014 FINDINGS: Heart size within normal limits. No evidence of airspace consolidation. No pleural effusion or pneumothorax. No acute bony abnormality. Thoracic spondylosis. Surgical clips  project in the region of the lower neck. IMPRESSION: No evidence of acute cardiopulmonary abnormality. Electronically Signed   By: Kellie Simmering DO   On: 04/03/2019 08:19    Procedures Procedures (including critical care time)  Medications Ordered in ED Medications  sodium chloride flush (NS) 0.9 % injection 3 mL (has no administration in time range)    ED Course  I have reviewed the triage vital signs and the nursing notes.  Pertinent labs & imaging results that  were available during my care of the patient were reviewed by me and considered in my medical decision making (see chart for details).    MDM Rules/Calculators/A&P  Britain Schupp Gallina is a 59 year old female with history of breast cancer status post radiation, hypertension who presents to the ED with chest pain, shortness of breath.  Patient with normal vitals.  No fever.  No infectious symptoms.  Pain on and off for the last several weeks.  Described as a burning pain that appears to be worse at night.  Has a history of hiatal hernia.  Does not know what makes it worse or better.  EKG shows sinus rhythm.  Patient has some cardiac risk factors and has a heart score of 2.  We will get troponins.  EKG shows no ischemic changes.  Does have a history of breast cancer.  Will get D-dimer to evaluate for PE.  Will get chest x-ray basic labs to further evaluate for cause of chest pain or shortness of breath.  She has clear breath sounds.  No concern for reactive airway process.  Possibly GI related/GERD.  Patient with normal D-dimer.  Doubt PE.  Serial troponins are normal.  Doubt ACS.  Covid test is negative.  No significant electrolyte abnormality, kidney injury, leukocytosis.  Chest x-ray without signs of infection.  Overall suspect likely GI related pain.  Given reassurance and discharged from the ED in good condition.  This chart was dictated using voice recognition software.  Despite best efforts to proofread,  errors can occur which can change the documentation meaning.    Final Clinical Impression(s) / ED Diagnoses Final diagnoses:  Atypical chest pain    Rx / DC Orders ED Discharge Orders    None       Lennice Sites, DO 04/03/19 1119

## 2019-04-03 NOTE — ED Triage Notes (Signed)
Patient c/o intermittent chest pain x 2 weeks and worse for the past 2-3 days. Patient c/o "feeling like I can not get my breath."

## 2019-04-06 ENCOUNTER — Ambulatory Visit (INDEPENDENT_AMBULATORY_CARE_PROVIDER_SITE_OTHER): Payer: 59 | Admitting: Internal Medicine

## 2019-04-06 ENCOUNTER — Other Ambulatory Visit: Payer: Self-pay

## 2019-04-06 ENCOUNTER — Encounter: Payer: Self-pay | Admitting: Internal Medicine

## 2019-04-06 VITALS — BP 134/88 | HR 80 | Temp 98.0°F | Ht 65.5 in | Wt 197.2 lb

## 2019-04-06 DIAGNOSIS — R079 Chest pain, unspecified: Secondary | ICD-10-CM

## 2019-04-06 DIAGNOSIS — Z01818 Encounter for other preprocedural examination: Secondary | ICD-10-CM | POA: Diagnosis not present

## 2019-04-06 DIAGNOSIS — D369 Benign neoplasm, unspecified site: Secondary | ICD-10-CM

## 2019-04-06 NOTE — Patient Instructions (Signed)
You have been scheduled for an endoscopy. Please follow written instructions given to you at your visit today. If you use inhalers (even only as needed), please bring them with you on the day of your procedure.   

## 2019-04-06 NOTE — Progress Notes (Signed)
HISTORY OF PRESENT ILLNESS:  Bethany [name] is a 59 y.o. female with COPD, left breast cancer status post radiation therapy completed December 2019, anxiety, and history of adenomatous colon polyps.  The patient presents herself today for evaluation of chest pain after being seen in the emergency room 3 days ago for the same.  I have reviewed the emergency room record including laboratories, EKG, and x-rays.  She was last seen in this office December 2019 for colon polyp surveillance.  Noted to have diverticulosis but no neoplasia.  Due to history of multiple adenomatous polyps, follow-up in 5 years recommended.  She tells me that her history is that of intermittent chest pain over the past few weeks.  She describes it as a mid chest tightness which is not related to exertion.  She will notice some radiation into the neck and arms bilaterally.  She becomes very anxious with this and subsequently describes sensations of shortness of breath.  This can occur several times daily.  Generally lasts for minutes then abates.  Thorough ER evaluation April 03, 2019 was unremarkable.  Chest x-ray revealed no acute abnormalities.  EKG was unremarkable.  D-dimer was normal.  Other laboratories were unremarkable including normal liver tests and normal blood counts with hemoglobin 13.0.  She initiated Prilosec OTC about 3 days ago.  Has not seen any difference in symptoms.  She has no history of acid reflux symptoms.  No dysphagia.  She has had some gradual weight gain.  GI review of systems is otherwise negative.  REVIEW OF SYSTEMS:  All non-GI ROS negative unless otherwise stated in the HPI except for anxiety, fatigue, sleeping problems, night sweats  Past Medical History:  Diagnosis Date  . Anxiety   . Breast cancer ([place])    left breast cancer/diagnosed in 10/2017/taking radiation treatment until 03/15/18  . Breast mass, left 11/2017   going thru radiation until 02/2018  . CAD (coronary artery disease)   .  COPD (chronic obstructive pulmonary disease) ([place])    beginning stages/small scar  . Dental crowns present   . History of hiatal hernia    no current med.  . Hypertension    states under control with med., has been on med. x 1 yr.  . Hypothyroidism   . MVP (mitral valve prolapse)   . Personal history of radiation therapy   . PONV (postoperative nausea and vomiting)   . Radiation fibrosis of lung ([place])   . Vitamin D deficiency     Past Surgical History:  Procedure Laterality Date  . ABDOMINAL HYSTERECTOMY     partial  . APPENDECTOMY    . AXILLARY SENTINEL NODE BIOPSY Left 12/09/2017   Procedure: AXILLARY SENTINEL NODE BIOPSY;  Surgeon: Erroll Luna, MD;  Location: [place];  Service: General;  Laterality: Left;  . BREAST LUMPECTOMY Left 11/2017  . BREAST LUMPECTOMY WITH NEEDLE LOCALIZATION Left 11/25/2017   Procedure: LEFT BREAST LUMPECTOMY WITH NEEDLE LOCALIZATION;  Surgeon: Erroll Luna, MD;  Location: [place];  Service: General;  Laterality: Left;  . COLONOSCOPY WITH PROPOFOL  10/03/2015  . LAPAROSCOPIC APPENDECTOMY N/A 10/03/2015   Procedure: APPENDECTOMY LAPAROSCOPIC;  Surgeon: Rolm Bookbinder, MD;  Location: [place];  Service: General;  Laterality: N/A;  . TOTAL THYROIDECTOMY  04/02/2003    Social History Bethany [name]  reports that she quit smoking about 4 years ago. She has never used smokeless tobacco. She reports current alcohol use. She reports that she does not use drugs.  family history includes Colon cancer in an other family member; Colon polyps in her mother; Heart disease in her father and paternal grandfather; Hypertension in her brother and father; Non-Hodgkin's lymphoma in her father.  Allergies  Allergen Reactions  . Oxycodone Nausea And Vomiting  . Latex Rash  . Sulfa Antibiotics     hives       PHYSICAL EXAMINATION: Vital signs: BP 134/88 (BP Location: Left Arm, Patient Position: Sitting, Cuff Size: Normal)    Pulse 80   Temp 98 F (36.7 C)   Ht 5' 5.5" (1.664 m) Comment: height measured without shoes  Wt 197 lb 4 oz (89.5 kg)   BMI 32.32 kg/m   Constitutional: generally well-appearing, no acute distress Psychiatric: alert and oriented x3, cooperative Eyes: extraocular movements intact, anicteric, conjunctiva pink Mouth: oral pharynx moist, no lesions Neck: supple no lymphadenopathy Cardiovascular: heart regular rate and rhythm, no murmur Lungs: clear to auscultation bilaterally Abdomen: soft, nontender, nondistended, no obvious ascites, no peritoneal signs, normal bowel sounds, no organomegaly Rectal: Omitted Extremities: no clubbing, cyanosis, or lower extremity edema bilaterally Skin: no lesions on visible extremities Neuro: No focal deficits.  Cranial nerves intact  ASSESSMENT:  1.  Atypical chest pain.  Negative recent ER evaluation.  Not obviously GI.  Requires further investigation 2.  History of multiple adenomas.  Surveillance up-to-date 3.  Multiple medical problems   PLAN:  1.  Continue PPI.  We will empirically increase her omeprazole from 20 mg daily to 40 mg daily. 2.  Schedule upper endoscopy.The nature of the procedure, as well as the risks, benefits, and alternatives were carefully and thoroughly reviewed with the patient. Ample time for discussion and questions allowed. The patient understood, was satisfied, and agreed to proceed. 3.  If endoscopy unrevealing, then return to the care of her primary provider regarding additional assessment of her chest pain.

## 2019-04-10 ENCOUNTER — Ambulatory Visit (INDEPENDENT_AMBULATORY_CARE_PROVIDER_SITE_OTHER): Payer: 59

## 2019-04-10 ENCOUNTER — Encounter: Payer: Self-pay | Admitting: Internal Medicine

## 2019-04-10 DIAGNOSIS — Z1159 Encounter for screening for other viral diseases: Secondary | ICD-10-CM

## 2019-04-11 LAB — SARS CORONAVIRUS 2 (TAT 6-24 HRS): SARS Coronavirus 2: NEGATIVE

## 2019-04-12 ENCOUNTER — Other Ambulatory Visit: Payer: Self-pay

## 2019-04-12 ENCOUNTER — Ambulatory Visit (AMBULATORY_SURGERY_CENTER): Payer: 59 | Admitting: Internal Medicine

## 2019-04-12 ENCOUNTER — Encounter: Payer: Self-pay | Admitting: Internal Medicine

## 2019-04-12 VITALS — BP 158/82 | HR 61 | Temp 98.0°F | Resp 14 | Ht 65.0 in | Wt 197.0 lb

## 2019-04-12 DIAGNOSIS — K449 Diaphragmatic hernia without obstruction or gangrene: Secondary | ICD-10-CM

## 2019-04-12 DIAGNOSIS — R079 Chest pain, unspecified: Secondary | ICD-10-CM

## 2019-04-12 MED ORDER — SODIUM CHLORIDE 0.9 % IV SOLN: 500.0000 mL | Freq: Once | INTRAVENOUS | Status: DC

## 2019-04-12 NOTE — Op Note (Signed)
[facility] Patient Name: Bethany [name] Procedure Date: 04/12/2019 2:22 PM MRN: [id number] Endoscopist: Docia Chuck. Henrene Pastor , MD Age: 59 Referring MD:  Date of Birth: [birth year] Gender: Female Account #: [id number] Procedure:                Upper GI endoscopy Indications:              Unexplained chest pain Medicines:                Monitored Anesthesia Care Procedure:                Pre-Anesthesia Assessment:                           - Prior to the procedure, a History and Physical                            was performed, and patient medications and                            allergies were reviewed. The patient's tolerance of                            previous anesthesia was also reviewed. The risks                            and benefits of the procedure and the sedation                            options and risks were discussed with the patient.                            All questions were answered, and informed consent                            was obtained. Prior Anticoagulants: The patient has                            taken no previous anticoagulant or antiplatelet                            agents. ASA Grade Assessment: II - A patient with                            mild systemic disease. After reviewing the risks                            and benefits, the patient was deemed in                            satisfactory condition to undergo the procedure.                           After obtaining informed consent, the endoscope was  passed under direct vision. Throughout the                            procedure, the patient's blood pressure, pulse, and                            oxygen saturations were monitored continuously. The                            Endoscope was introduced through the mouth, and                            advanced to the second part of duodenum. The upper                            GI endoscopy was accomplished  without difficulty.                            The patient tolerated the procedure well. Scope In: Scope Out: Findings:                 The esophagus was normal.                           The stomach was normal. Very small hiatal hernia                           The examined duodenum was normal.                           The cardia and gastric fundus were normal on                            retroflexion. Complications:            No immediate complications. Estimated Blood Loss:     Estimated blood loss: none. Impression:               1. Normal upper endoscopy.                           2. No cause for chest pain found. Recommendation:           - Patient has a contact number available for                            emergencies. The signs and symptoms of potential                            delayed complications were discussed with the                            patient. Return to normal activities tomorrow.                            Written discharge instructions were provided to the  patient.                           - Resume previous diet.                           - Continue present medications. Complete 1 month                            course of omeprazole, then stop                           - Return to the care of your primary provider Docia Chuck. Henrene Pastor, MD 04/12/2019 2:30:46 PM This report has been signed electronically.

## 2019-04-12 NOTE — Progress Notes (Signed)
A and O x3. Report to RN. Tolerated MAC anesthesia well.Teeth unchanged after procedure.

## 2019-04-12 NOTE — Patient Instructions (Signed)
YOU HAD AN ENDOSCOPIC PROCEDURE TODAY AT [facility]:   Refer to the procedure report that was given to you for any specific questions about what was found during the examination.  If the procedure report does not answer your questions, please call your gastroenterologist to clarify.  If you requested that your care partner not be given the details of your procedure findings, then the procedure report has been included in a sealed envelope for you to review at your convenience later.  YOU SHOULD EXPECT: Some feelings of bloating in the abdomen. Passage of more gas than usual.  Walking can help get rid of the air that was put into your GI tract during the procedure and reduce the bloating. If you had a lower endoscopy (such as a colonoscopy or flexible sigmoidoscopy) you may notice spotting of blood in your stool or on the toilet paper. If you underwent a bowel prep for your procedure, you may not have a normal bowel movement for a few days.  Please Note:  You might notice some irritation and congestion in your nose or some drainage.  This is from the oxygen used during your procedure.  There is no need for concern and it should clear up in a day or so.  SYMPTOMS TO REPORT IMMEDIATELY:   Following upper endoscopy (EGD)  Vomiting of blood or coffee ground material  New chest pain or pain under the shoulder blades  Painful or persistently difficult swallowing  New shortness of breath  Fever of 100F or higher  Black, tarry-looking stools  For urgent or emergent issues, a gastroenterologist can be reached at any hour by calling [phone].   DIET:  We do recommend a small meal at first, but then you may proceed to your regular diet.  Drink plenty of fluids but you should avoid alcoholic beverages for 24 hours.  ACTIVITY:  You should plan to take it easy for the rest of today and you should NOT DRIVE or use heavy machinery until tomorrow (because of the sedation medicines used  during the test).    FOLLOW UP: Our staff will call the number listed on your records 48-72 hours following your procedure to check on you and address any questions or concerns that you may have regarding the information given to you following your procedure. If we do not reach you, we will leave a message.  We will attempt to reach you two times.  During this call, we will ask if you have developed any symptoms of COVID 19. If you develop any symptoms (ie: fever, flu-like symptoms, shortness of breath, cough etc.) before then, please call [phone].  If you test positive for Covid 19 in the 2 weeks post procedure, please call and report this information to [place].    If any biopsies were taken you will be contacted by phone or by letter within the next 1-3 weeks.  Please call us at [phone] if you have not heard about the biopsies in 3 weeks.    SIGNATURES/CONFIDENTIALITY: You and/or your care partner have signed paperwork which will be entered into your electronic medical record.  These signatures attest to the fact that that the information above on your After Visit Summary has been reviewed and is understood.  Full responsibility of the confidentiality of this discharge information lies with you and/or your care-partner.   Thank you for allowing [place] to provide your healthcare today.

## 2019-04-14 ENCOUNTER — Telehealth: Payer: Self-pay

## 2019-04-14 NOTE — Telephone Encounter (Signed)
No answer, unable to leave a message, B.Ellen Goris RN. 

## 2019-04-14 NOTE — Telephone Encounter (Signed)
First attempt follow up call to pt, unable to leave message 

## 2019-04-19 ENCOUNTER — Telehealth: Payer: Self-pay | Admitting: *Deleted

## 2019-04-19 DIAGNOSIS — N63 Unspecified lump in unspecified breast: Secondary | ICD-10-CM

## 2019-04-19 NOTE — Telephone Encounter (Signed)
  Follow up Call-  Call back number 04/12/2019 03/15/2018  Post procedure Call Back phone  # [phone]  Permission to leave phone message Yes Yes  Some recent data might be hidden     Left message

## 2019-04-19 NOTE — Telephone Encounter (Signed)
This RN spoke with pt per her call stating she has located a lump in her right breast- she has history of left breast cancer with last mammogram August of 2020.  She states the lump is on the " outside of the right breast - and about the size of a gumball "  Per above - informed pt recommendation is for a dedicated diagnostic mammogram of her right breast - with ultrasound and if needed proceed with biopsy.  Pt is in agreement to the above.  Orders placed.  No further needs at this time.

## 2019-04-20 ENCOUNTER — Other Ambulatory Visit: Payer: Self-pay | Admitting: Oncology

## 2019-04-20 DIAGNOSIS — N63 Unspecified lump in unspecified breast: Secondary | ICD-10-CM

## 2019-04-26 ENCOUNTER — Ambulatory Visit
Admission: RE | Admit: 2019-04-26 | Discharge: 2019-04-26 | Disposition: A | Discharge status: Home / Self Care | Payer: 59 | Source: Ambulatory Visit | Attending: Oncology | Admitting: Oncology

## 2019-04-26 ENCOUNTER — Other Ambulatory Visit: Payer: Self-pay

## 2019-04-26 ENCOUNTER — Other Ambulatory Visit: Payer: Self-pay | Admitting: Oncology

## 2019-04-26 ENCOUNTER — Telehealth: Payer: Self-pay | Admitting: *Deleted

## 2019-04-26 DIAGNOSIS — N63 Unspecified lump in unspecified breast: Secondary | ICD-10-CM

## 2019-04-26 IMAGING — US US BREAST*R* LIMITED INC AXILLA
1 series · 10 of 10 positions shown · non-contrast
Comparison: Previous exam(s).

CLINICAL DATA: Patient with history of left breast cancer. Patient
presents for evaluation of palpable abnormality within the outer
aspect of the right breast.

EXAM:
DIGITAL DIAGNOSTIC RIGHT MAMMOGRAM WITH CAD AND TOMO
ULTRASOUND RIGHT BREAST

[Series 1: us breast*right* limited inc axilla · 0.06mm/px · 10 of 10 slices shown]
[im 1/10]
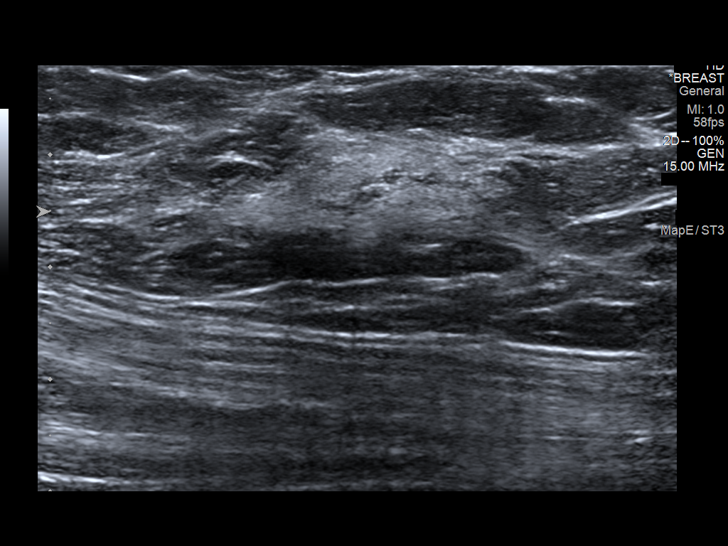
[im 2/10]
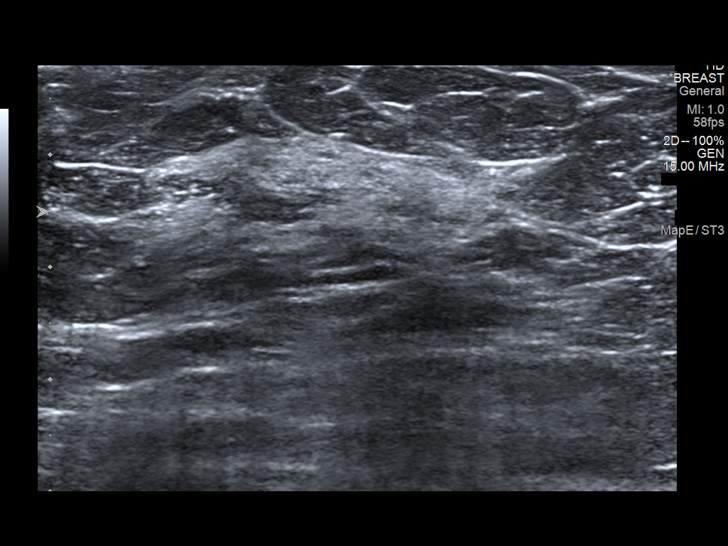
[im 3/10]
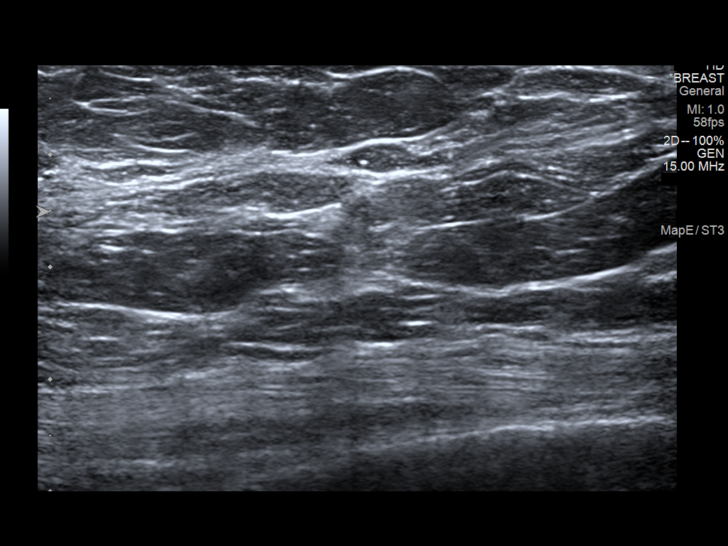
[im 4/10]
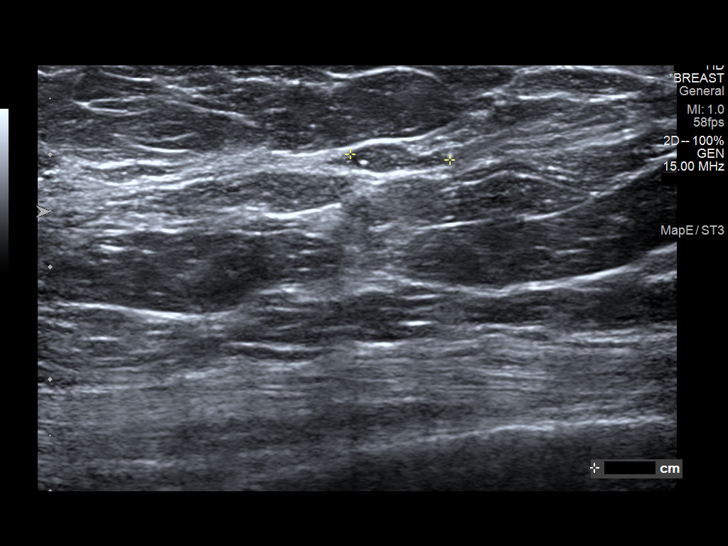
[im 5/10]
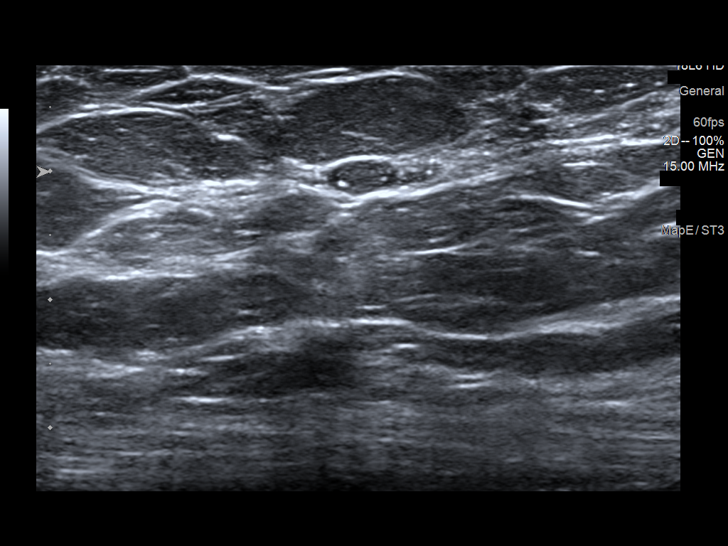
[im 6/10]
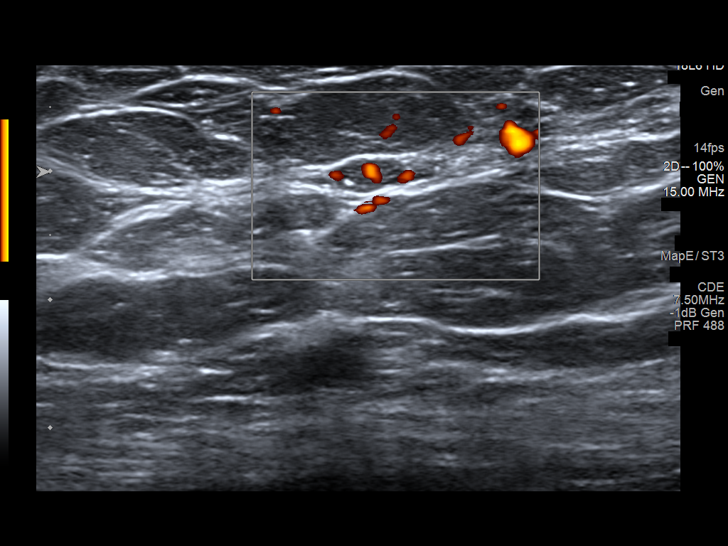
[im 7/10]
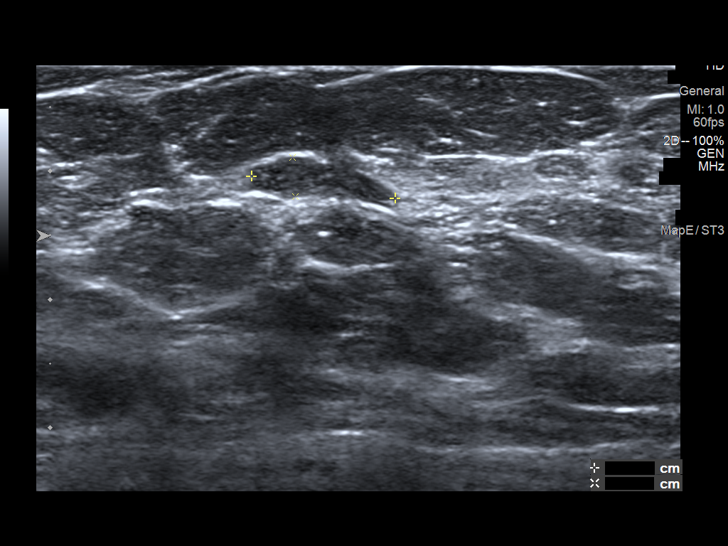
[im 8/10]
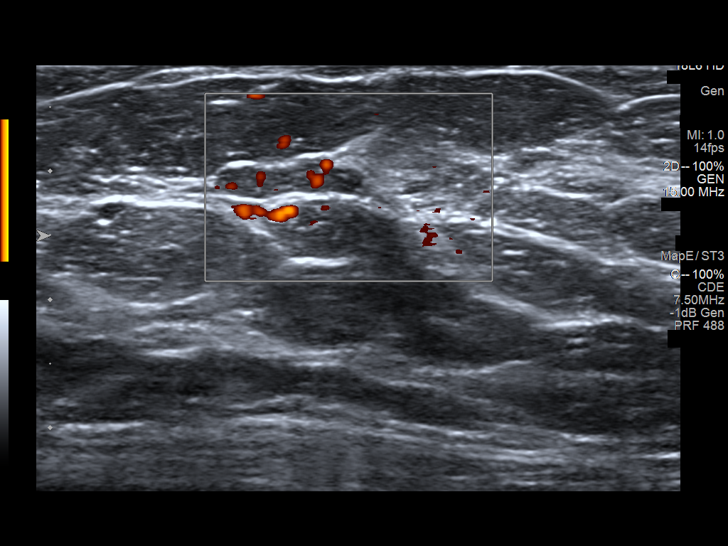
[im 9/10]
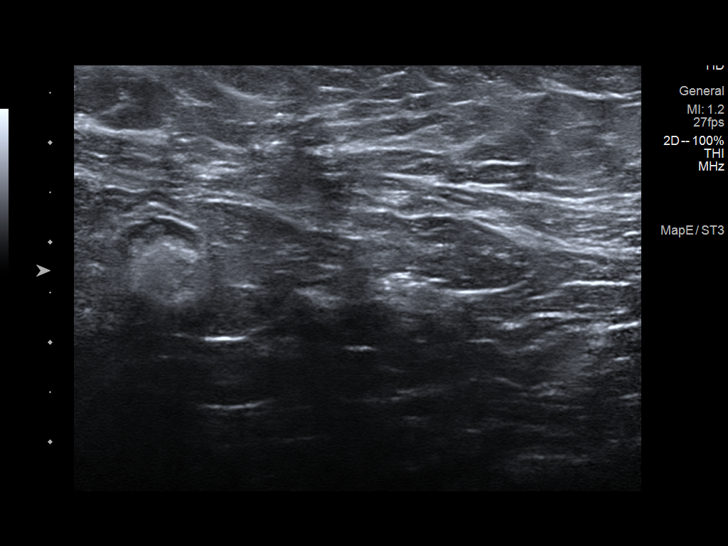
[im 10/10]
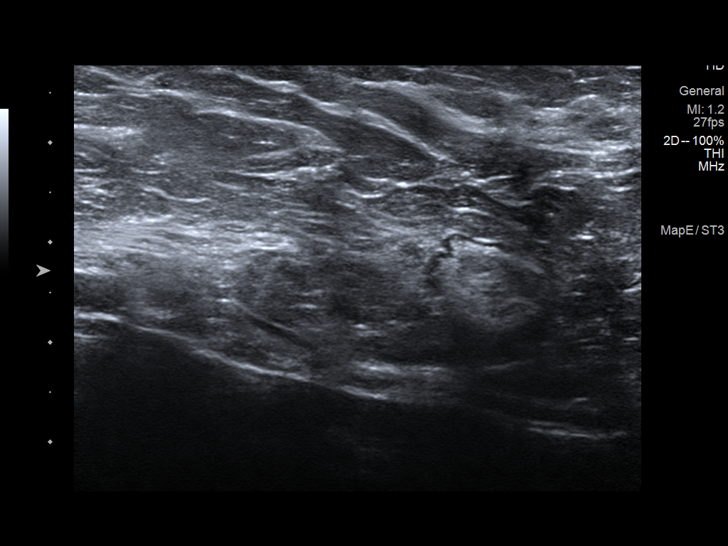

[10 of 10 positions shown; findings below may reference images not displayed]

ACR Breast Density Category c: The breast tissue is heterogeneously
dense, which may obscure small masses.
FINDINGS: No concerning masses, calcifications or distortion identified within
the right breast.

Mammographic images were processed with CAD.

On physical exam, dense tissue is palpated within the upper-outer
right breast.

Targeted ultrasound is performed, showing normal dense tissue
without suspicious mass right breast 10 o'clock position 6 cm from
nipple at the reported site of palpable concern.

Within the right breast 10 o'clock position 3 cm from nipple there
is a 9 x 11 x 3 mm lobular hypoechoic mass which may be intraductal.

No right axillary adenopathy.
IMPRESSION: Indeterminate right breast mass 10 o'clock position 3 cm from
nipple.

No suspicious abnormality at the site of palpable concern right
breast 10 o'clock position 6 cm from nipple.

RECOMMENDATION:
Ultrasound-guided core needle biopsy indeterminate right breast mass
10 o'clock 3 cm from nipple position.

I have discussed the findings and recommendations with the patient.
If applicable, a reminder letter will be sent to the patient
regarding the next appointment.

BI-RADS CATEGORY  4: Suspicious.

## 2019-04-26 IMAGING — MG MM DIGITAL DIAGNOSTIC UNILAT*R* W/ TOMO W/ CAD
6 series · 6 of 18 positions shown · non-contrast
Comparison: Previous exam(s).

CLINICAL DATA: Patient with history of left breast cancer. Patient
presents for evaluation of palpable abnormality within the outer
aspect of the right breast.

EXAM:
DIGITAL DIAGNOSTIC RIGHT MAMMOGRAM WITH CAD AND TOMO
ULTRASOUND RIGHT BREAST

[R MLO synth-2D]
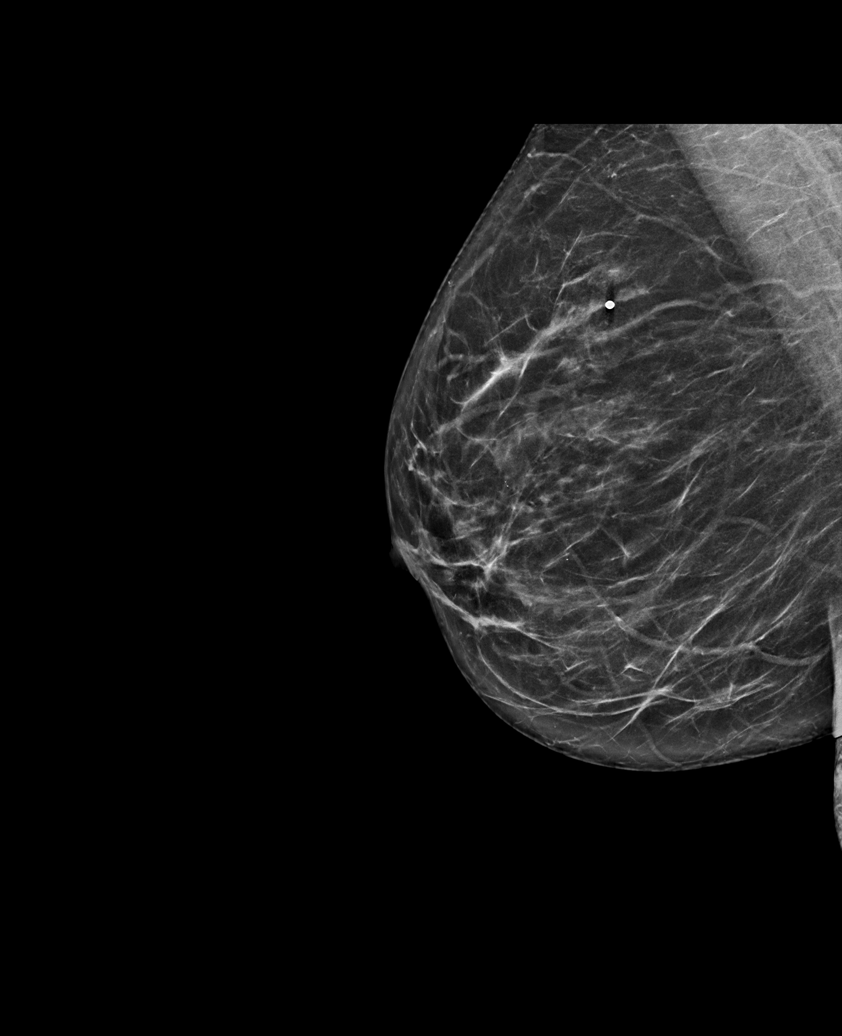

[R CC synth-2D]
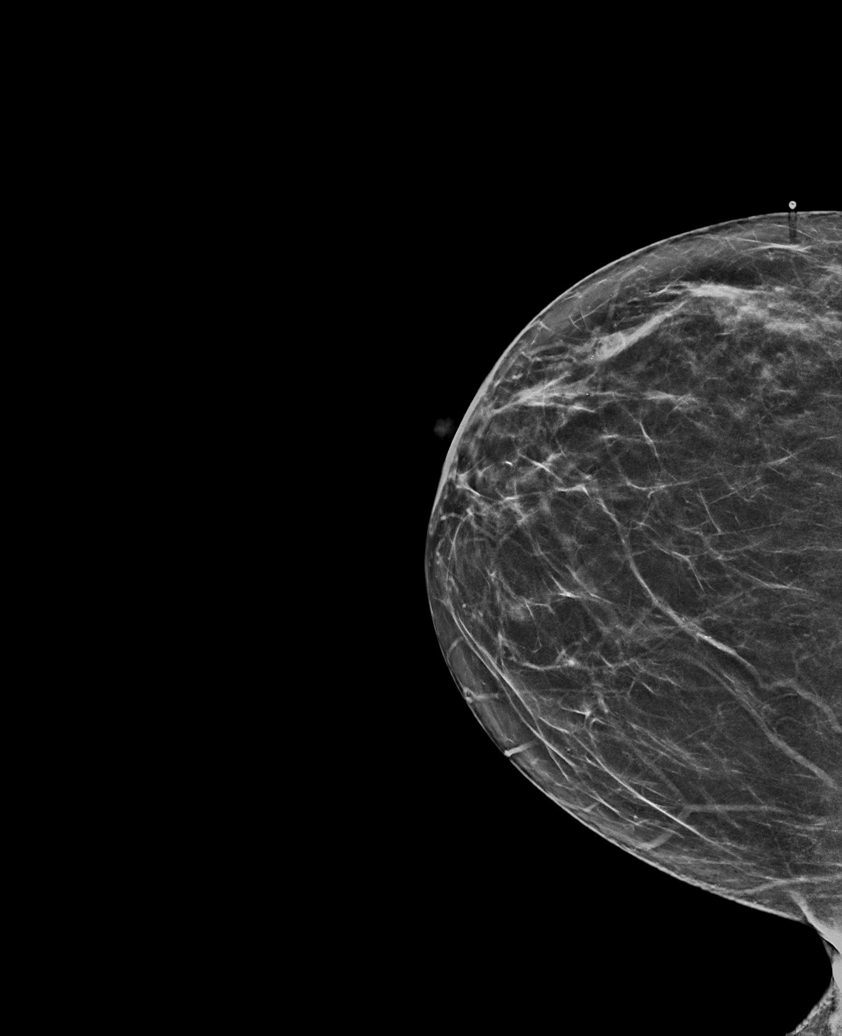

[R TAN synth-2D]
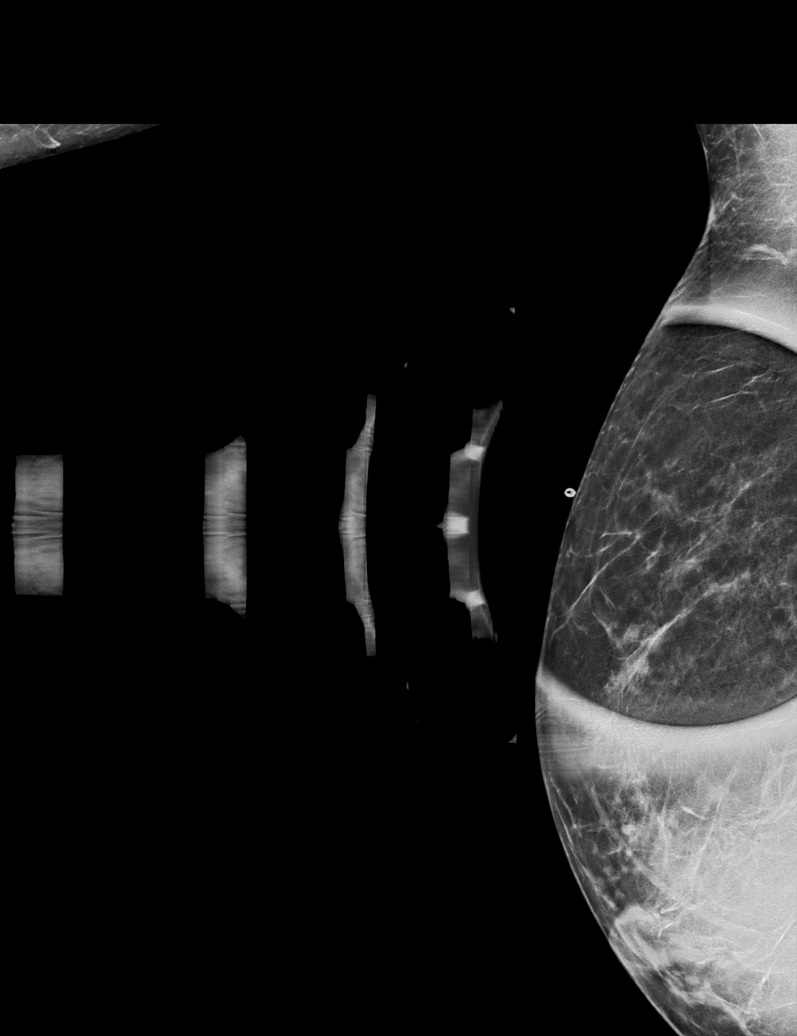

[R CC tomo · tomo slice 33/66.0]
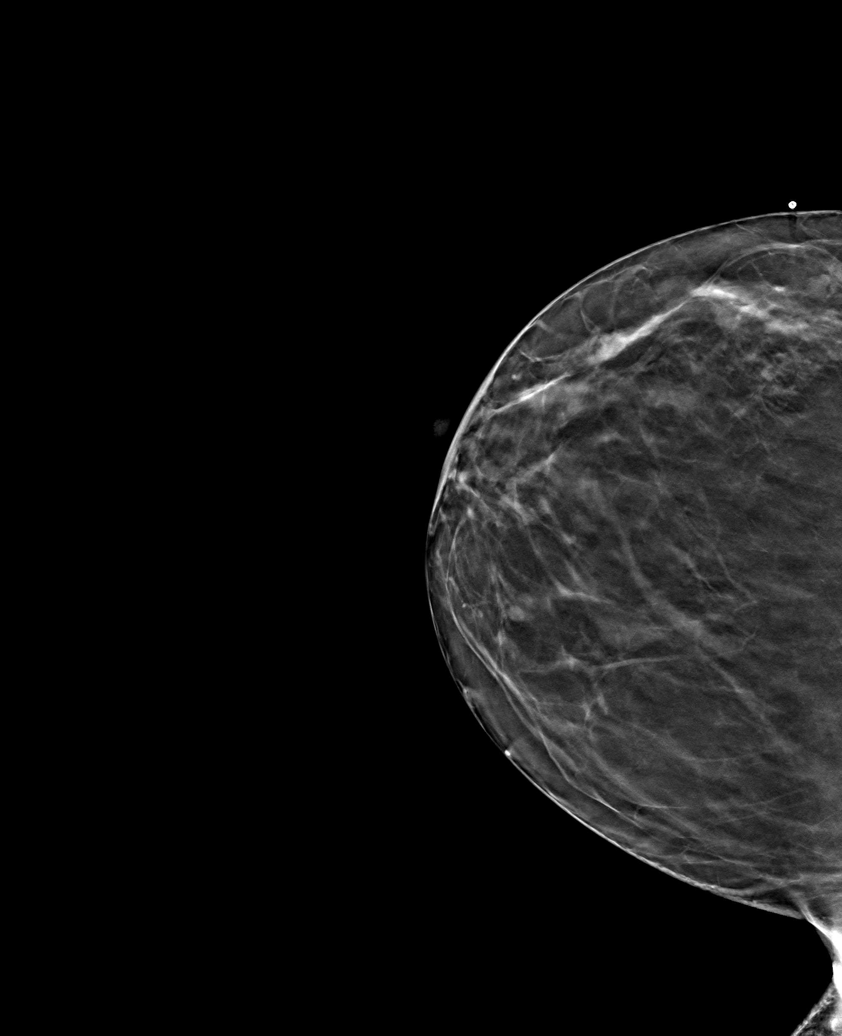

[R TAN tomo · tomo slice 28/55.0]
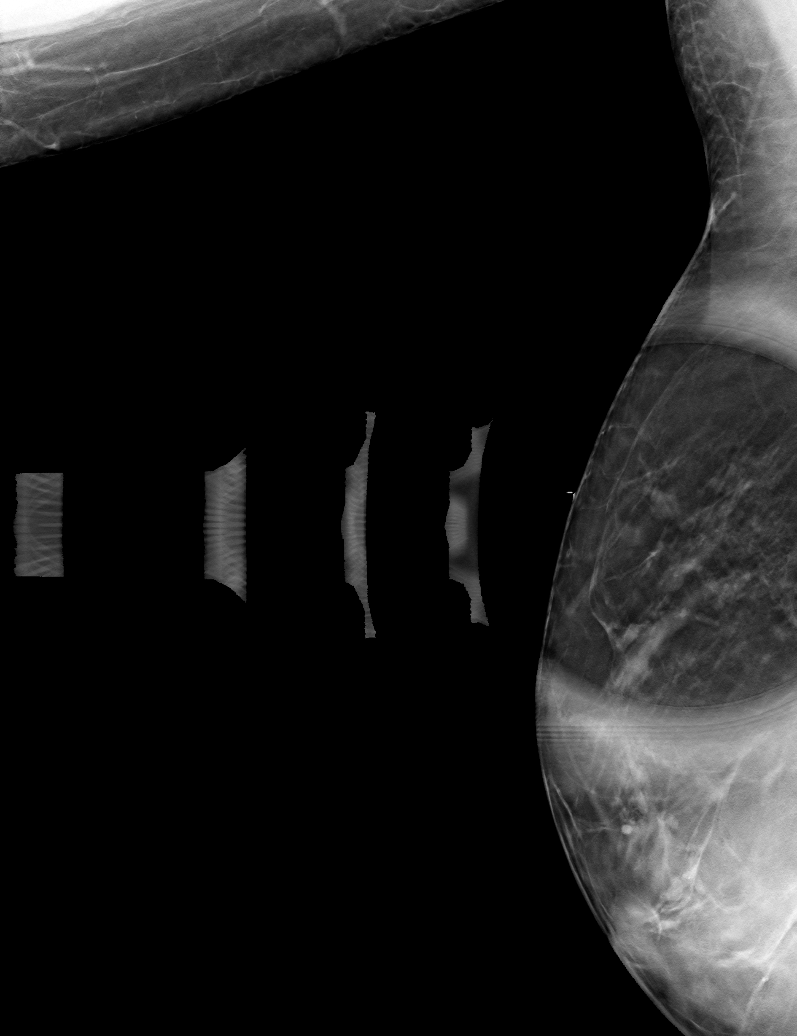

[R MLO tomo · tomo slice 37/72.0]
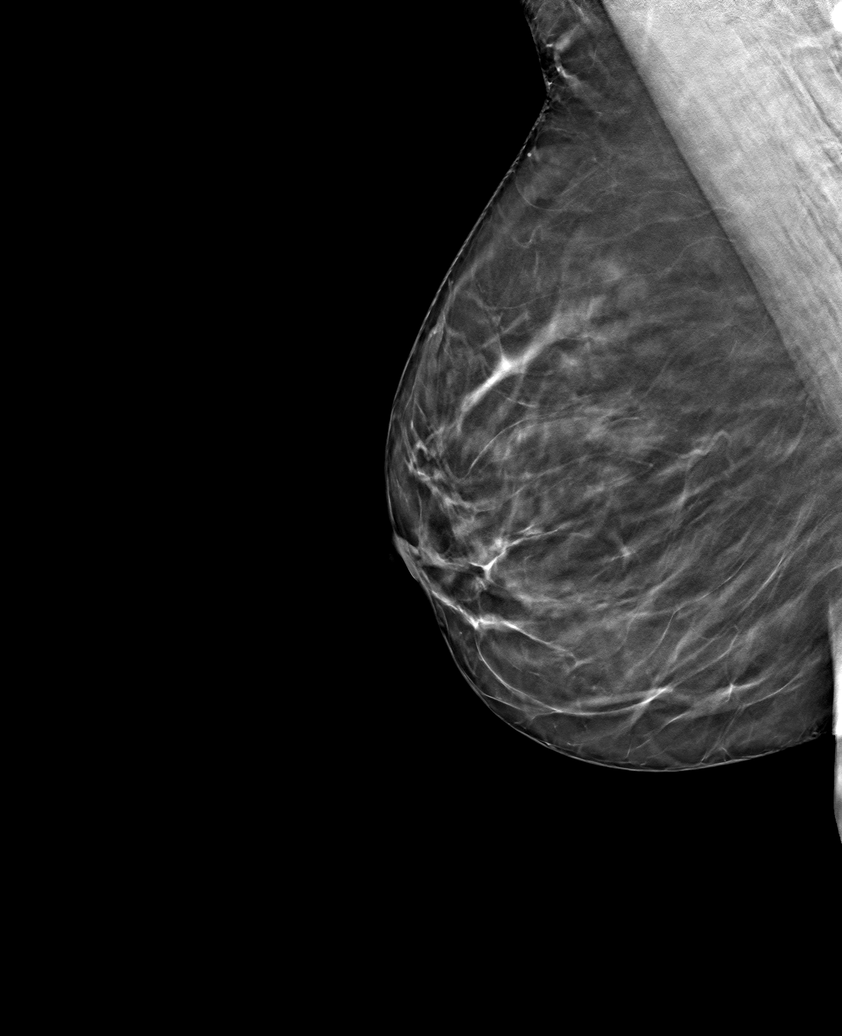

[6 of 18 positions shown; findings below may reference images not displayed]

ACR Breast Density Category c: The breast tissue is heterogeneously
dense, which may obscure small masses.
FINDINGS: No concerning masses, calcifications or distortion identified within
the right breast.

Mammographic images were processed with CAD.

On physical exam, dense tissue is palpated within the upper-outer
right breast.

Targeted ultrasound is performed, showing normal dense tissue
without suspicious mass right breast 10 o'clock position 6 cm from
nipple at the reported site of palpable concern.

Within the right breast 10 o'clock position 3 cm from nipple there
is a 9 x 11 x 3 mm lobular hypoechoic mass which may be intraductal.

No right axillary adenopathy.
IMPRESSION: Indeterminate right breast mass 10 o'clock position 3 cm from
nipple.

No suspicious abnormality at the site of palpable concern right
breast 10 o'clock position 6 cm from nipple.

RECOMMENDATION:
Ultrasound-guided core needle biopsy indeterminate right breast mass
10 o'clock 3 cm from nipple position.

I have discussed the findings and recommendations with the patient.
If applicable, a reminder letter will be sent to the patient
regarding the next appointment.

BI-RADS CATEGORY  4: Suspicious.

## 2019-04-26 NOTE — Telephone Encounter (Signed)
This RN spoke with pt per her call with questions regarding abnormal  Right mammo and [place] with recommendation for biopsy.  " at the [facility] - they mentioned I likely would need surgery - so why wouldn't I see the surgeon instead and discuss just doing surgery instead of having a biopsy and then surgery?"  Note pt had lumpectomy under Dr Brantley Stage in September of 2019 for left breast cancer.  This RN discussed reasons biopsies are done even if surgery will be done- including if a cancer- chemotherapy could shrink the tumor for better outcome with surgery.  She asked if she was prone to have more lumps and then further biopsies- with this RN stating due to not knowing what this abnormality is - that question cannot be well answered.  Per phone discussion- pt would prefer to see Dr Brantley Stage - this note will be forwarded to him and his office for review and scheduling of an appointment.

## 2019-05-05 ENCOUNTER — Other Ambulatory Visit: Payer: 59

## 2019-05-15 ENCOUNTER — Ambulatory Visit
Admission: RE | Admit: 2019-05-15 | Discharge: 2019-05-15 | Disposition: A | Discharge status: Home / Self Care | Payer: 59 | Source: Ambulatory Visit | Attending: Oncology | Admitting: Oncology

## 2019-05-15 ENCOUNTER — Encounter: Payer: Self-pay | Admitting: Cardiovascular Disease

## 2019-05-15 ENCOUNTER — Ambulatory Visit (INDEPENDENT_AMBULATORY_CARE_PROVIDER_SITE_OTHER): Payer: 59 | Admitting: Cardiovascular Disease

## 2019-05-15 ENCOUNTER — Other Ambulatory Visit: Payer: Self-pay

## 2019-05-15 VITALS — BP 122/80 | HR 73 | Ht 65.0 in | Wt 197.0 lb

## 2019-05-15 DIAGNOSIS — R079 Chest pain, unspecified: Secondary | ICD-10-CM

## 2019-05-15 DIAGNOSIS — N63 Unspecified lump in unspecified breast: Secondary | ICD-10-CM

## 2019-05-15 LAB — BASIC METABOLIC PANEL
BUN/Creatinine Ratio: 13 (ref 9–23)
BUN: 12 mg/dL (ref 6–24)
CO2: 25 mmol/L (ref 20–29)
Calcium: 9.3 mg/dL (ref 8.7–10.2)
Chloride: 103 mmol/L (ref 96–106)
Creatinine, Ser: 0.91 mg/dL (ref 0.57–1.00)
GFR calc Af Amer: 81 mL/min/{1.73_m2} (ref >59)
GFR calc non Af Amer: 70 mL/min/{1.73_m2} (ref >59)
Glucose: 95 mg/dL (ref 65–99)
Potassium: 4.2 mmol/L (ref 3.5–5.2)
Sodium: 139 mmol/L (ref 134–144)

## 2019-05-15 IMAGING — MG MM BREAST LOCALIZATION CLIP
4 series · 4 of 12 positions shown · non-contrast
Comparison: Previous exam(s).

CLINICAL DATA: Post biopsy mammogram of right breast for clip
placement.

EXAM:
DIAGNOSTIC RIGHT MAMMOGRAM POST ULTRASOUND BIOPSY

[R ML synth-2D]
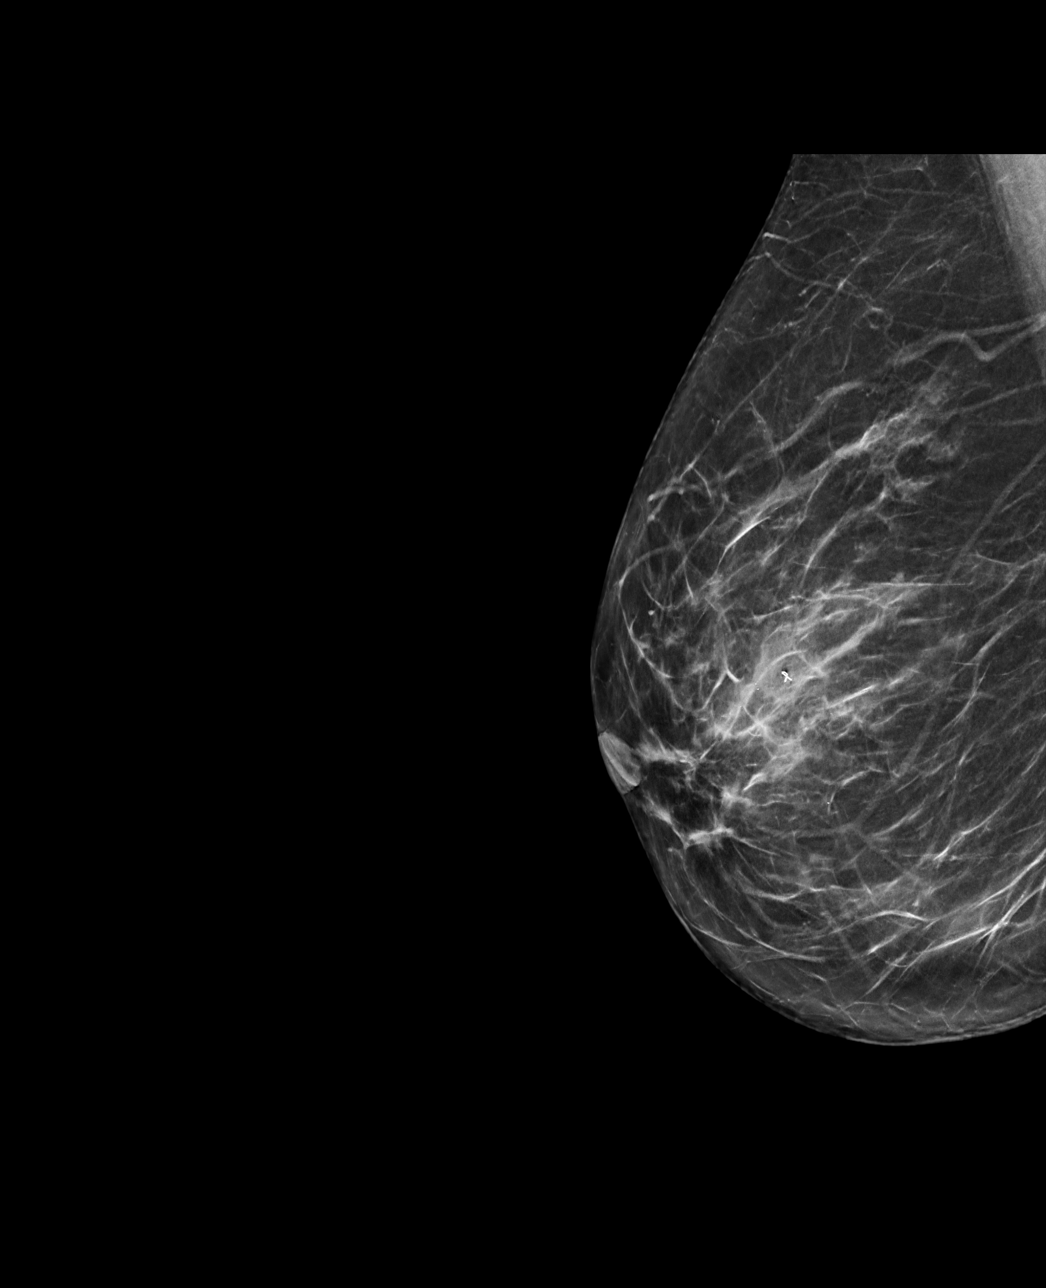

[R CC synth-2D]
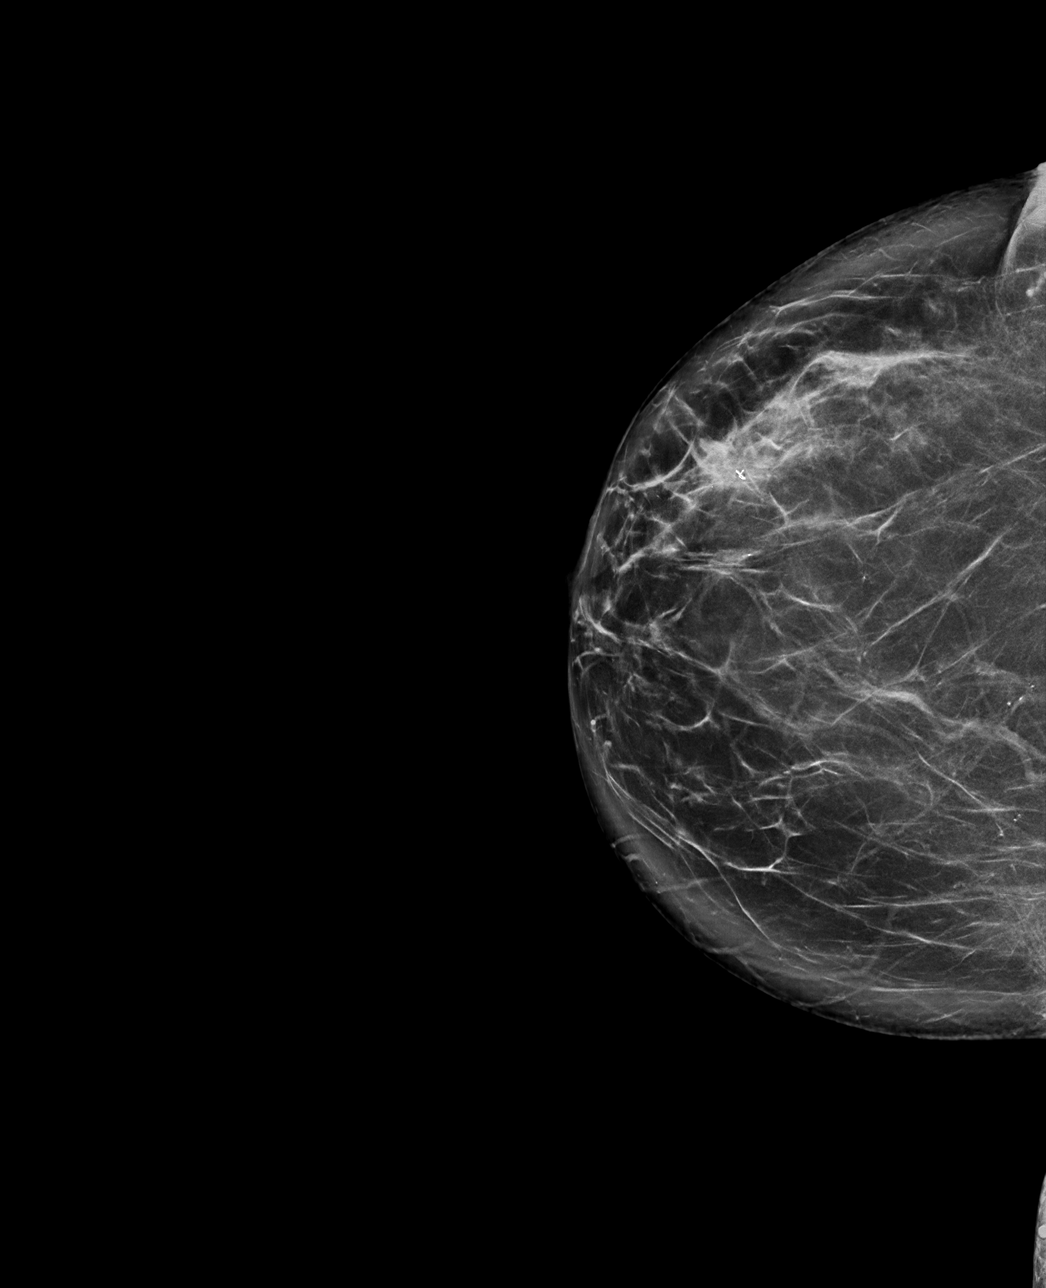

[R ML tomo · tomo slice 38/75.0]
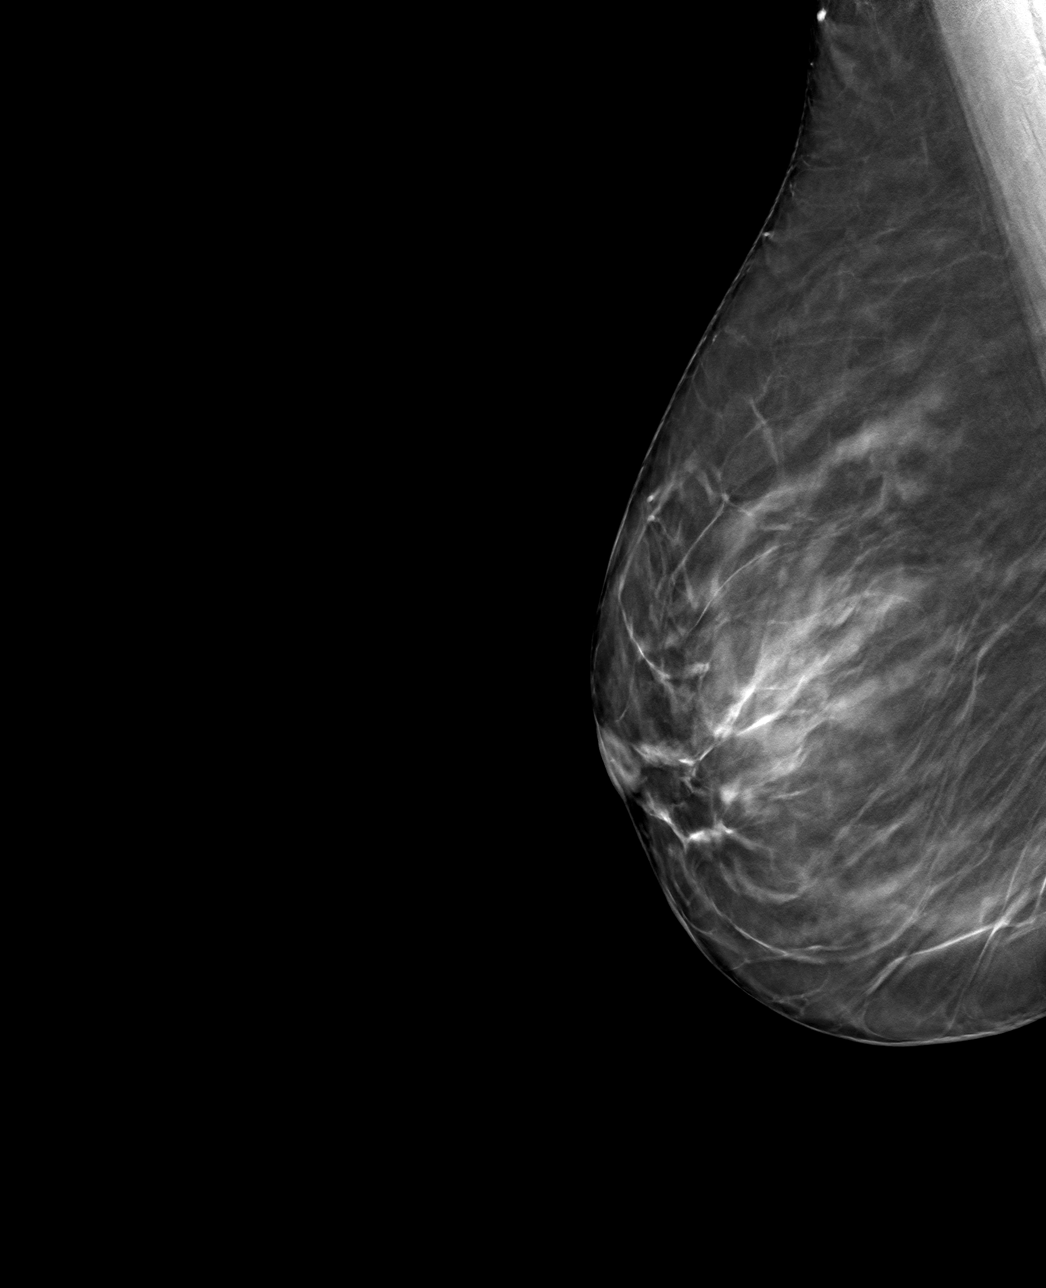

[R CC tomo · tomo slice 43/85.0]
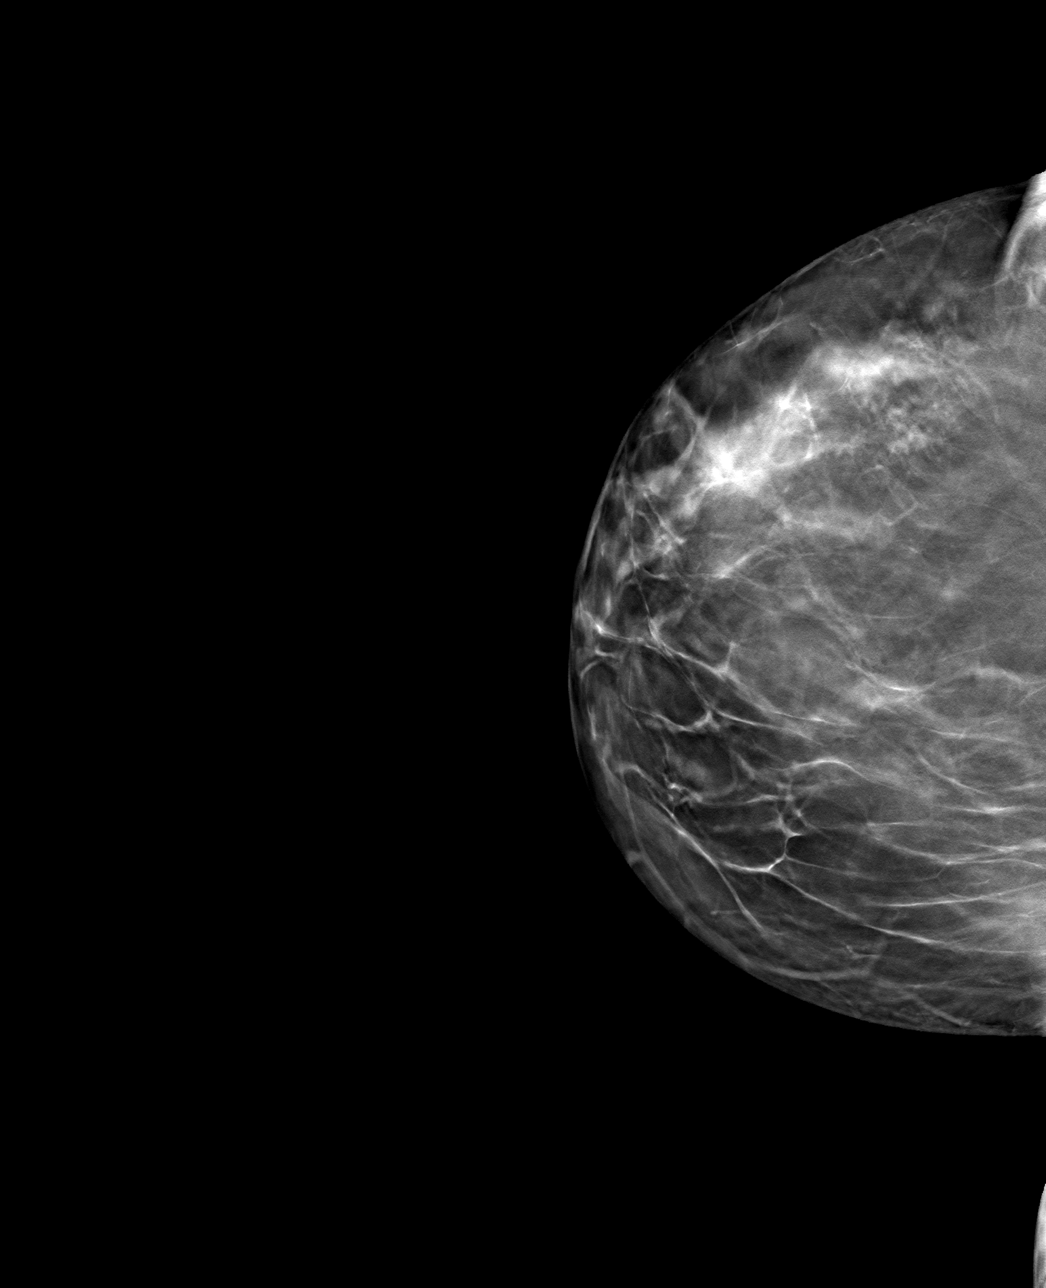

[4 of 12 positions shown; findings below may reference images not displayed]

FINDINGS: Mammographic images were obtained following ultrasound guided biopsy
of a mass in the right breast at 10 o'clock. The biopsy marking clip
is in expected position at the site of biopsy.
IMPRESSION: Appropriate positioning of the ribbon shaped biopsy marking clip at
the site of biopsy in the upper-outer right breast.

Final Assessment: Post Procedure Mammograms for Marker Placement

## 2019-05-15 IMAGING — US US  BREAST BX W/ LOC DEV 1ST LESION IMG BX SPEC US GUIDE*R*
1 series · 9 of 9 positions shown · non-contrast
Comparison: Previous exam(s).
COMPARISON: Previous exam(s).

Addendum:
CLINICAL DATA: 58-year-old female presenting for ultrasound-guided
biopsy of a right breast mass.

EXAM:
ULTRASOUND GUIDED RIGHT BREAST CORE NEEDLE BIOPSY

[Series 1: us breast bx w/ loc dev 1st lesion img bx spec us  · 0.06mm/px · 9 of 9 slices shown]
[im 1/9]
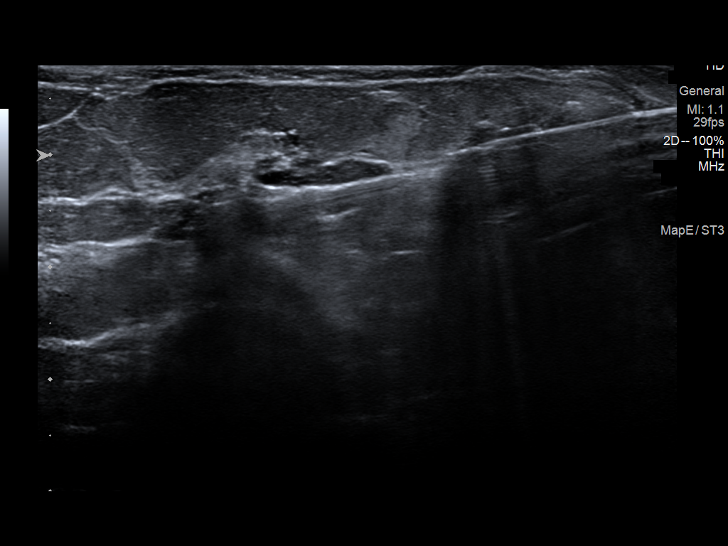
[im 2/9]
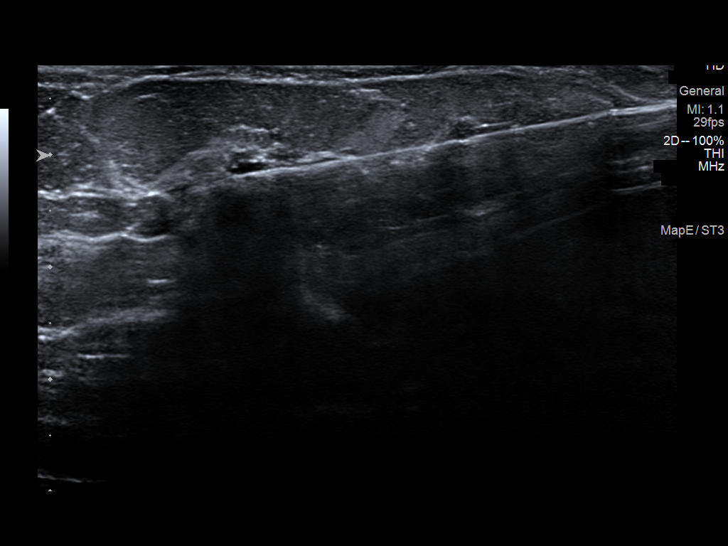
[im 3/9]
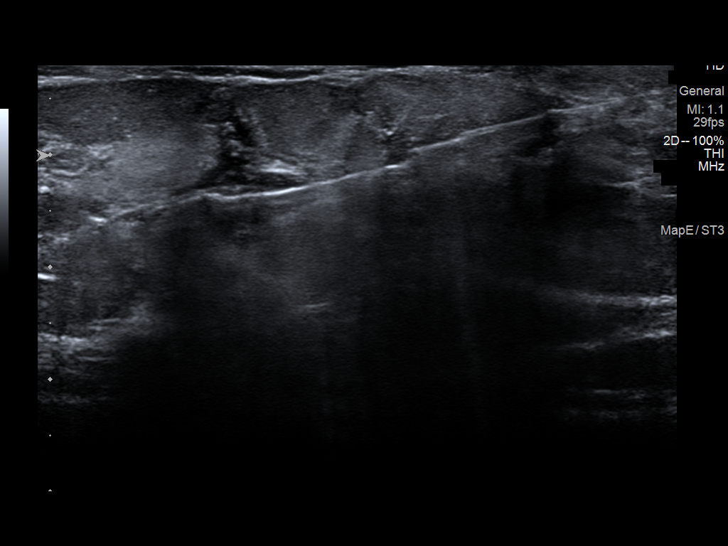
[im 4/9]
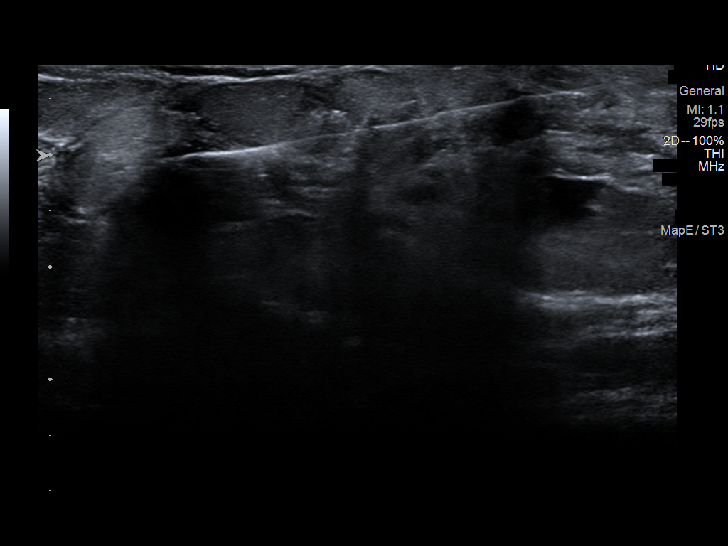
[im 5/9]
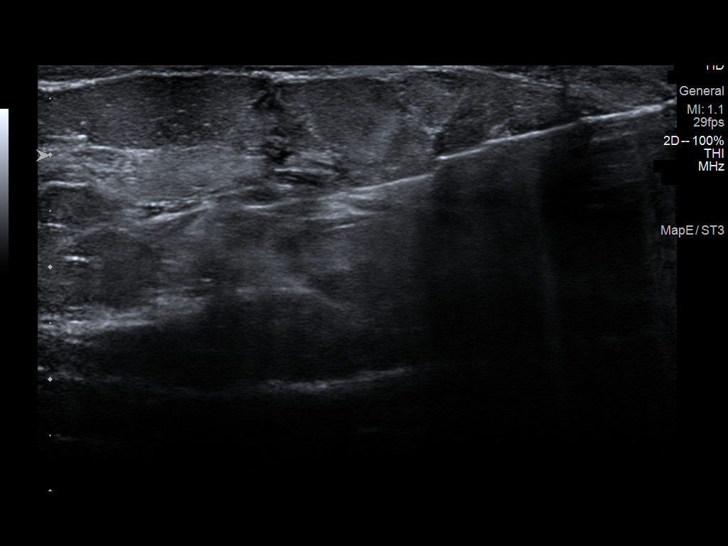
[im 6/9]
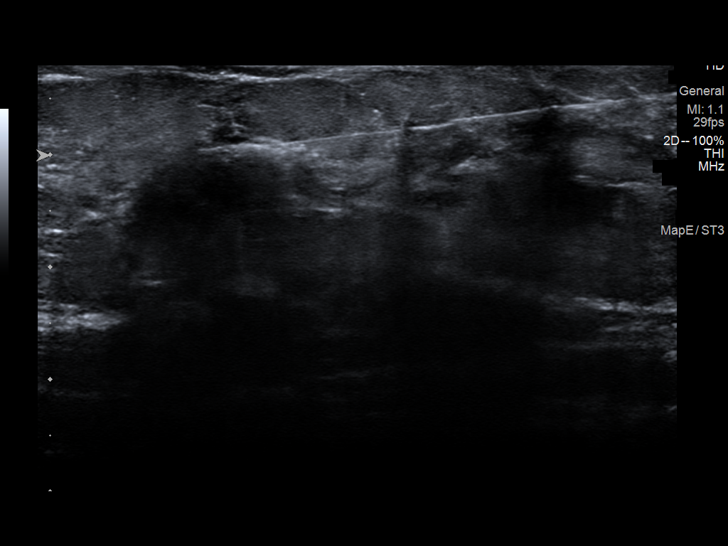
[im 7/9]
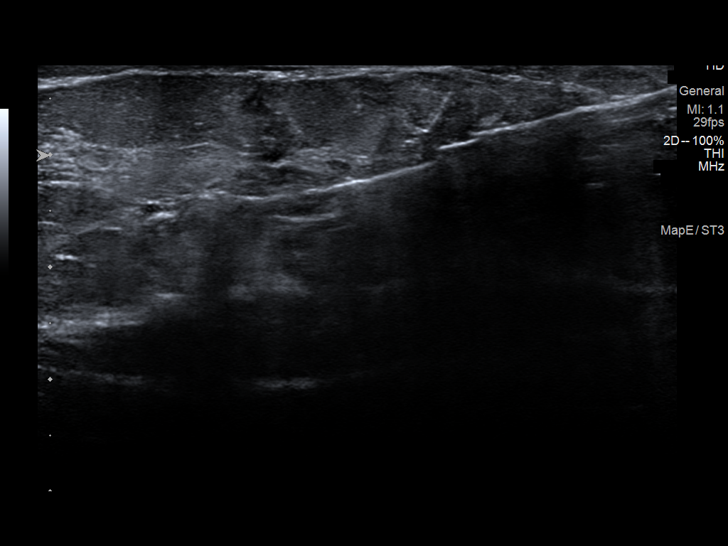
[im 8/9]
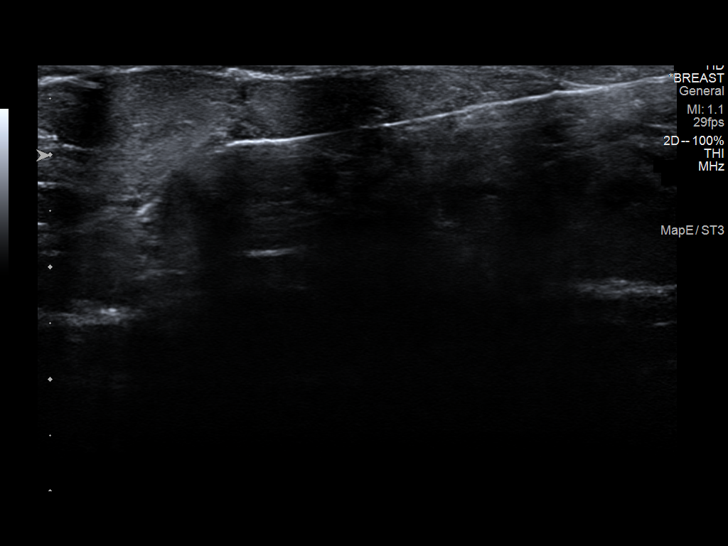
[im 9/9]
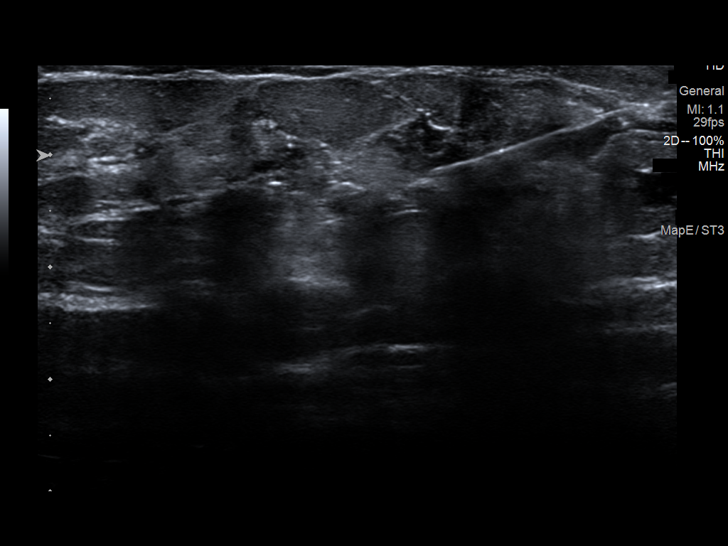

[9 of 9 positions shown; findings below may reference images not displayed]



Lesion quadrant: Upper-outer quadrant

Using sterile technique and 1% Lidocaine as local anesthetic, under
direct ultrasound visualization, a 14 gauge ADELIA device was
used to perform biopsy of a mass in the right breast at 10 o'clock
using an inferior approach. At the conclusion of the procedure
ribbon shaped tissue marker clip was deployed into the biopsy
cavity. Follow up 2 view mammogram was performed and dictated
separately.
IMPRESSION: Ultrasound guided biopsy of a right breast mass at 10 o'clock. No
apparent complications.

ADDENDUM:
Pathology revealed INTRADUCTAL PAPILLOMA of the RIGHT breast, 10
o'clock. This was found to be concordant by Dr. ADELIA,
with excision recommended.

Pathology results were discussed with the patient by telephone. The
patient reported doing well after the biopsy with tenderness at the
site. Post biopsy instructions and care were reviewed and questions
were answered. The patient was encouraged to call The [REDACTED]

Surgical consultation has been arranged with Dr. ADELIA at
[REDACTED] on [DATE].

Pathology results reported by ADELIA RN on [DATE].



Lesion quadrant: Upper-outer quadrant

Using sterile technique and 1% Lidocaine as local anesthetic, under
direct ultrasound visualization, a 14 gauge ADELIA device was
used to perform biopsy of a mass in the right breast at 10 o'clock
using an inferior approach. At the conclusion of the procedure
ribbon shaped tissue marker clip was deployed into the biopsy
cavity. Follow up 2 view mammogram was performed and dictated
separately.
IMPRESSION: Ultrasound guided biopsy of a right breast mass at 10 o'clock. No
apparent complications.

## 2019-05-15 MED ORDER — METOPROLOL TARTRATE 100 MG PO TABS: 100.0000 mg | ORAL_TABLET | Freq: Once | ORAL | 0 refills | Status: DC

## 2019-05-15 NOTE — Patient Instructions (Addendum)
Medication Instructions:  Your provider recommends that you continue on your current medications as directed. Please refer to the Current Medication list given to you today.   *If you need a refill on your cardiac medications before your next appointment, please call your pharmacy*  Lab Work: TODAY: BMET If you have labs (blood work) drawn today and your tests are completely normal, you will receive your results only by: Marland Kitchen MyChart Message (if you have MyChart) OR . A paper copy in the mail If you have any lab test that is abnormal or we need to change your treatment, we will call you to review the results.  Testing/Procedures: Dr. Angelena Form recommends you have a CARDIAC CT.  Your provider has requested that you have an echocardiogram. Echocardiography is a painless test that uses sound waves to create images of your heart. It provides your doctor with information about the size and shape of your heart and how well your heart's chambers and valves are working. This procedure takes approximately one hour. There are no restrictions for this procedure.  Follow-Up: Your provider recommends that you schedule a follow-up appointment in 4-6 weeks with Dr. Angelena Form.

## 2019-05-15 NOTE — Progress Notes (Signed)
Chief Complaint  Patient presents with  . New Patient (Initial Visit)    Chest pain   History of Present Illness: 59 yo female with history of CAD, breast cancer in 2019 s/p radiation therapy, hiatal hernia, thyroid cancer, hypothyroidism, HTN former tobacco abuse and anxiety here today as a new patient for evaluation of chest pain. She was seen in the ED January 2021 with chest pain. Troponin was negative. EKG without ischemic changes. Normal EGD per Dr. Henrene Pastor 04/12/19 with no cause for chest pain isolated. She is known to have radiation induced lung fibrosis. Chest CT August 2020 with evidence of coronary calcification.   She tells me today that she continues to have chest pain. This occurs mostly at rest and radiates to her neck. She notices it at night. Associated dyspnea. Lasts for several minutes. No exertional chest pain.   Primary Care Physician: Leeroy Cha, MD   Past Medical History:  Diagnosis Date  . Anxiety   . Breast cancer ([place])    left breast cancer/diagnosed in 10/2017/taking radiation treatment until 03/15/18  . Breast mass, left 11/2017   going thru radiation until 02/2018  . CAD (coronary artery disease)   . Chest pain 06/19/2014  . COPD (chronic obstructive pulmonary disease) ([place])    beginning stages/small scar  . Dental crowns present   . History of hiatal hernia    no current med.  Marland Kitchen History of thyroid cancer 12/15/2017  . Hypertension    states under control with med., has been on med. x 1 yr.  . Hypothyroidism   . Ischemic chest pain ([place])   . Malignant neoplasm of upper-outer quadrant of left breast in female, estrogen receptor positive ([place])   . 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 MVP (mitral valve prolapse)   . Personal history of radiation therapy   . PONV (postoperative nausea and vomiting)   . Radiation fibrosis of lung ([place])   . Syncope and collapse 12/25/2008   Qualifier: Diagnosis of  By: Philemon Kingdom    . Urinary incontinence     USI   . Vitamin D deficiency     Past Surgical History:  Procedure Laterality Date  . ABDOMINAL HYSTERECTOMY     partial  . APPENDECTOMY    . AXILLARY SENTINEL NODE BIOPSY Left 12/09/2017   Procedure: AXILLARY SENTINEL NODE BIOPSY;  Surgeon: Erroll Luna, MD;  Location: [facility];  Service: General;  Laterality: Left;  . BREAST LUMPECTOMY Left 11/2017  . BREAST LUMPECTOMY WITH NEEDLE LOCALIZATION Left 11/25/2017   Procedure: LEFT BREAST LUMPECTOMY WITH NEEDLE LOCALIZATION;  Surgeon: Erroll Luna, MD;  Location: [facility];  Service: General;  Laterality: Left;  . COLONOSCOPY WITH PROPOFOL  10/03/2015  . LAPAROSCOPIC APPENDECTOMY N/A 10/03/2015   Procedure: APPENDECTOMY LAPAROSCOPIC;  Surgeon: Rolm Bookbinder, MD;  Location: [facility];  Service: General;  Laterality: N/A;  . TOTAL THYROIDECTOMY  04/02/2003    Current Outpatient Medications  Medication Sig Dispense Refill  . acetaminophen (TYLENOL) 325 MG tablet Take 975 mg by mouth every 6 (six) hours as needed for mild pain or headache.    . albuterol (VENTOLIN HFA) 108 (90 Base) MCG/ACT inhaler Inhale 2 puffs into the lungs every 6 (six) hours as needed for wheezing or shortness of breath. 18 g 6  . aspirin EC 81 MG tablet Take 81 mg by mouth daily.    . ergocalciferol (VITAMIN D2) 50000 UNITS capsule Take 50,000 Units by mouth once a week. On Thurs &  Sun    . ibuprofen (ADVIL) 200 MG tablet Take 600 mg by mouth every 6 (six) hours as needed for fever, headache or moderate pain.    Marland Kitchen levothyroxine (SYNTHROID, LEVOTHROID) 137 MCG tablet Take 137 mcg by mouth at bedtime.     Marland Kitchen losartan-hydrochlorothiazide (HYZAAR) 50-12.5 MG tablet Take 1 tablet by mouth daily.    . Multiple Vitamin (MULTIVITAMIN WITH MINERALS) TABS tablet Take 1 tablet by mouth daily.    Marland Kitchen omeprazole (PRILOSEC) 20 MG capsule Take 20 mg by mouth daily as needed (acid reflux).    . venlafaxine (EFFEXOR) 37.5 MG tablet Take 37.5 mg by  mouth 2 (two) times daily.    . metoprolol tartrate (LOPRESSOR) 100 MG tablet Take 1 tablet (100 mg total) by mouth once for 1 dose. Take 2 hours prior to your cardiac CT. 1 tablet 0   Current Facility-Administered Medications  Medication Dose Route Frequency Provider Last Rate Last Admin  . 0.9 %  sodium chloride infusion  500 mL Intravenous Once Irene Shipper, MD        Allergies  Allergen Reactions  . Oxycodone Nausea And Vomiting  . Latex Rash  . Sulfa Antibiotics     hives    Social History   Socioeconomic History  . Marital status: Married    Spouse name: Not on file  . Number of children: 3  . Years of education: Not on file  . Highest education level: Not on file  Occupational History  . Occupation: Self employed  Tobacco Use  . Smoking status: Former Smoker    Quit date: 08/14/2014    Years since quitting: 4.7  . Smokeless tobacco: Never Used  Substance and Sexual Activity  . Alcohol use: Yes    Comment: rarely  . Drug use: No  . Sexual activity: Yes    Partners: Male  Other Topics Concern  . Not on file  Social History Narrative  . Not on file   Social Determinants of Health   Financial Resource Strain:   . Difficulty of Paying Living Expenses: Not on file  Food Insecurity:   . Worried About Charity fundraiser in the Last Year: Not on file  . Ran Out of Food in the Last Year: Not on file  Transportation Needs:   . Lack of Transportation (Medical): Not on file  . Lack of Transportation (Non-Medical): Not on file  Physical Activity:   . Days of Exercise per Week: Not on file  . Minutes of Exercise per Session: Not on file  Stress:   . Feeling of Stress : Not on file  Social Connections:   . Frequency of Communication with Friends and Family: Not on file  . Frequency of Social Gatherings with Friends and Family: Not on file  . Attends Religious Services: Not on file  . Active Member of Clubs or Organizations: Not on file  . Attends Theatre manager Meetings: Not on file  . Marital Status: Not on file  Intimate Partner Violence:   . Fear of Current or Ex-Partner: Not on file  . Emotionally Abused: Not on file  . Physically Abused: Not on file  . Sexually Abused: Not on file    Family History  Problem Relation Age of Onset  . Hypertension Father   . Heart disease Father   . Non-Hodgkin's lymphoma Father   . Hypertension Brother   . Heart disease Paternal Grandfather   . Colon cancer Other  great maternal aunt   . Colon polyps Mother   . Stomach cancer Neg Hx   . Rectal cancer Neg Hx   . Esophageal cancer Neg Hx     Review of Systems:  As stated in the HPI and otherwise negative.   BP 122/80   Pulse 73   Ht 5\' 5"  (1.651 m)   Wt 197 lb (89.4 kg)   SpO2 95%   BMI 32.78 kg/m   Physical Examination: General: Well developed, well nourished, NAD  HEENT: OP clear, mucus membranes moist  SKIN: warm, dry. No rashes. Neuro: No focal deficits  Musculoskeletal: Muscle strength 5/5 all ext  Psychiatric: Mood and affect normal  Neck: No JVD, no carotid bruits, no thyromegaly, no lymphadenopathy.  Lungs:Clear bilaterally, no wheezes, rhonci, crackles Cardiovascular: Regular rate and rhythm. No murmurs, gallops or rubs. Abdomen:Soft. Bowel sounds present. Non-tender.  Extremities: No lower extremity edema. Pulses are 2 + in the bilateral DP/PT.  EKG:  EKG is ordered today. The ekg ordered today demonstrates NSR, rate 73 bpm.   Recent Labs: 02/07/2019: TSH 1.91 04/03/2019: ALT 27; BUN 14; Creatinine, Ser 0.76; Hemoglobin 13.0; Platelets 282; Potassium 3.9; Sodium 140   Lipid Panel    Component Value Date/Time   CHOL 192 06/19/2014 2234   TRIG 90 06/19/2014 2234   HDL 52 06/19/2014 2234   CHOLHDL 3.7 06/19/2014 2234   VLDL 18 06/19/2014 2234   LDLCALC 122 (H) 06/19/2014 2234     Wt Readings from Last 3 Encounters:  05/15/19 197 lb (89.4 kg)  04/12/19 197 lb (89.4 kg)  04/06/19 197 lb 4 oz (89.5  kg)     Other studies Reviewed: Additional studies/ records that were reviewed today include:  Review of the above records demonstrates:    Assessment and Plan:   1. Chest pain: Cannot exclude angina. Risk factors for CAD include FH of CAD, former tobacco abuse, HTN, HLD. Evidence of coronary artery calcification on chest CT in 2020. Will arrange a gated cardiac CTA to assess for obstructive CAD. Will arrange an echo to assess LV size and function.   Current medicines are reviewed at length with the patient today.  The patient does not have concerns regarding medicines.  The following changes have been made:  no change  Labs/ tests ordered today include:   Orders Placed This Encounter  Procedures  . CT CORONARY MORPH W/CTA COR W/SCORE W/CA W/CM &/OR WO/CM  . CT CORONARY FRACTIONAL FLOW RESERVE DATA PREP  . CT CORONARY FRACTIONAL FLOW RESERVE FLUID ANALYSIS  . Basic metabolic panel  . EKG 12-Lead  . ECHOCARDIOGRAM COMPLETE   Disposition:   FU with me in 4-6 weeks.    Signed, [name], MD 05/15/2019 9:37 AM    [facility] [address], [place], [place]  [address] Phone: [phone]; Fax: [phone]

## 2019-05-19 ENCOUNTER — Other Ambulatory Visit: Payer: Self-pay | Admitting: Surgery

## 2019-05-19 ENCOUNTER — Ambulatory Visit: Payer: Self-pay | Admitting: Surgery

## 2019-05-19 DIAGNOSIS — D241 Benign neoplasm of right breast: Secondary | ICD-10-CM

## 2019-05-19 NOTE — H&P (Signed)
Rashelle Ireland Randol Documented: 05/12/2019 10:41 AM Location: [facility] Patient #: [id number] DOB: 13-May-1960 Married / Language: [name] / Race: White Female   History of Present Illness Marcello Moores A. Lydell Moga MD; 05/12/2019 12:30 PM) Patient words: Ms.. Trudel is a pleasant 59 y.o. female with Stage IA left breast invasive ductal carcinoma, ER+/PR+/HER2-, diagnosed in 10/2017, treated with lumpectomy, adjuvant radiation therapy, and anti-estrogen therapy with Tamoxifen, that she was unable to tolerate   She returns at her 6 month mammogram on the left revealed a mass was felt to be indeterminate left upper quadrant measuring 1 cm. A biopsy was not performed. She is here today to discuss this as well as for breast cancer follow-up. She was unable to tolerate anti-estrogen therapy and that was stopped. She has no complaints of pain, discharge or mass.        Patient with history of left breast cancer. Patient presents for evaluation of palpable abnormality within the outer aspect of the right breast.  EXAM: DIGITAL DIAGNOSTIC RIGHT MAMMOGRAM WITH CAD AND TOMO  ULTRASOUND RIGHT BREAST  COMPARISON: Previous exam(s).  ACR Breast Density Category c: The breast tissue is heterogeneously dense, which may obscure small masses.  FINDINGS: No concerning masses, calcifications or distortion identified within the right breast.  Mammographic images were processed with CAD.  On physical exam, dense tissue is palpated within the upper-outer right breast.  Targeted ultrasound is performed, showing normal dense tissue without suspicious mass right breast 10 o'clock position 6 cm from nipple at the reported site of palpable concern.  Within the right breast 10 o'clock position 3 cm from nipple there is a 9 x 11 x 3 mm lobular hypoechoic mass which may be intraductal.  No right axillary adenopathy.  IMPRESSION: Indeterminate right breast mass 10 o'clock position 3 cm  from nipple.  No suspicious abnormality at the site of palpable concern right breast 10 o'clock position 6 cm from nipple.  RECOMMENDATION: Ultrasound-guided core needle biopsy indeterminate right breast mass 10 o'clock 3 cm from nipple position.  I have discussed the findings and recommendations with the patient. If applicable, a reminder letter will be sent to the patient regarding the next appointment.  BI-RADS CATEGORY 4: Suspicious.   Electronically Signed By: Lovey Newcomer M.D. On: 04/26/2019 12:40.  The patient is a 59 year old female.   Problem List/Past Medical Sharyn Lull R. Brooks, CMA; 05/12/2019 10:42 AM) BREAST CANCER, LEFT (C50.912)  BREAST CANCER, FEMALE (C50.919)  POSTOPERATIVE STATE [phone])  LEFT BREAST MASS (N63.20)   Past Surgical History Sharyn Lull R. Brooks, CMA; 05/12/2019 10:42 AM) Appendectomy  Colon Polyp Removal - Colonoscopy  Hysterectomy (not due to cancer) - Partial  Oral Surgery  Thyroid Surgery   Diagnostic Studies History Sharyn Lull R. Rolena Infante, CMA; 05/12/2019 10:42 AM) Colonoscopy  within last year Mammogram  1-3 years ago Pap Smear  1-5 years ago  Allergies Sharyn Lull R. Brooks, CMA; 05/12/2019 10:44 AM) oxyCODONE HCl *ANALGESICS - OPIOID*   Medication History Sharyn Lull R. Brooks, CMA; 05/12/2019 10:44 AM) Venlafaxine HCl ER (37.'5MG'$  Capsule ER 24HR, Oral) Active. Albuterol (90MCG/ACT Aerosol Soln, Inhalation) Active. Levothyroxine Sodium (137MCG Tablet, Oral) Active. Vitamin D (Ergocalciferol) (50000UNIT Capsule, Oral) Active. Medications Reconciled  Social History Sharyn Lull R. Brooks, CMA; 05/12/2019 10:42 AM) Alcohol use  Occasional alcohol use. Caffeine use  Coffee. No drug use  Tobacco use  Former smoker.  Family History Sharyn Lull R. Rolena Infante, CMA; 05/12/2019 10:42 AM) Alcohol Abuse  Family Members In General. Cancer  Father. Heart Disease  Father. Heart  disease in female family member before age 33   Hypertension  Father. Respiratory Condition  Father.  Pregnancy / Birth History Sharyn Lull R. Rolena Infante, CMA; 05/12/2019 10:42 AM) Age at menarche  64 years. Gravida  3 Length (months) of breastfeeding  3-6 Maternal age  83-20 Para  3  Other Problems Sharyn Lull R. Brooks, CMA; 05/12/2019 10:42 AM) Back Pain  Heart murmur  Pancreatitis  Thyroid Cancer  Thyroid Disease   Vitals Sharyn Lull R. Brooks CMA; 05/12/2019 10:42 AM) 05/12/2019 10:41 AM Weight: 195.38 lb Height: 66in Body Surface Area: 1.98 m Body Mass Index: 31.53 kg/m  Pulse: 82 (Regular)  BP: 132/82 (Sitting, Left Arm, Standard)       Physical Exam (Stephenson Cichy A. Lanijah Warzecha MD; 05/12/2019 12:31 PM) General Mental Status-Alert. General Appearance-Consistent with stated age. Hydration-Well hydrated. Voice-Normal.  Eye Eyeball - Bilateral-Extraocular movements intact. Sclera/Conjunctiva - Bilateral-No scleral icterus.  Breast Note: Left breast shows no masses. No nipple discharge. Right breast is postoperative change without mass lesion.   Lymphatic Head & Neck  General Head & Neck Lymphatics: Bilateral - Description - Normal. Axillary  General Axillary Region: Bilateral - Description - Normal. Tenderness - Non Tender.    Assessment & Plan (Jarmarcus Wambold A. Lowry Bala MD; 05/12/2019 12:32 PM) BREAST CANCER, LEFT (C50.912) Impression: NAD  TOTAL TIME 20 MINUTES BREAST MASS, RIGHT (N63.10) Impression: schedule core bx right breast mass Further recommendations once the biopsies done Current Plans Pt Education - [employer] Free Text Education/Instructions: discussed with patient and provided information. Started Valium 2 MG Oral Tablet, 1 (one) Tablet as needed, #1, 05/12/2019, No Refill.   Signed electronically by Turner Daniels, MD (05/12/2019 12:34 PM)

## 2019-05-24 ENCOUNTER — Other Ambulatory Visit: Payer: Self-pay | Admitting: Surgery

## 2019-05-24 DIAGNOSIS — D241 Benign neoplasm of right breast: Secondary | ICD-10-CM

## 2019-05-25 ENCOUNTER — Encounter (HOSPITAL_COMMUNITY): Payer: Self-pay

## 2019-05-25 ENCOUNTER — Emergency Department (HOSPITAL_COMMUNITY)
Admission: EM | Admit: 2019-05-25 | Discharge: 2019-05-25 | Disposition: A | Discharge status: Home / Self Care | Payer: 59 | Attending: Emergency Medicine | Admitting: Emergency Medicine

## 2019-05-25 ENCOUNTER — Emergency Department (HOSPITAL_COMMUNITY): Payer: 59

## 2019-05-25 ENCOUNTER — Other Ambulatory Visit: Payer: Self-pay

## 2019-05-25 DIAGNOSIS — I10 Essential (primary) hypertension: Secondary | ICD-10-CM | POA: Diagnosis not present

## 2019-05-25 DIAGNOSIS — W010XXA Fall on same level from slipping, tripping and stumbling without subsequent striking against object, initial encounter: Secondary | ICD-10-CM | POA: Insufficient documentation

## 2019-05-25 DIAGNOSIS — Y998 Other external cause status: Secondary | ICD-10-CM | POA: Insufficient documentation

## 2019-05-25 DIAGNOSIS — Z7982 Long term (current) use of aspirin: Secondary | ICD-10-CM | POA: Insufficient documentation

## 2019-05-25 DIAGNOSIS — S52132A Displaced fracture of neck of left radius, initial encounter for closed fracture: Secondary | ICD-10-CM

## 2019-05-25 DIAGNOSIS — Y92838 Other recreation area as the place of occurrence of the external cause: Secondary | ICD-10-CM | POA: Diagnosis not present

## 2019-05-25 DIAGNOSIS — Z91040 Latex allergy status: Secondary | ICD-10-CM | POA: Diagnosis not present

## 2019-05-25 DIAGNOSIS — Z87891 Personal history of nicotine dependence: Secondary | ICD-10-CM | POA: Insufficient documentation

## 2019-05-25 DIAGNOSIS — Z85850 Personal history of malignant neoplasm of thyroid: Secondary | ICD-10-CM | POA: Insufficient documentation

## 2019-05-25 DIAGNOSIS — J449 Chronic obstructive pulmonary disease, unspecified: Secondary | ICD-10-CM | POA: Diagnosis not present

## 2019-05-25 DIAGNOSIS — Z79899 Other long term (current) drug therapy: Secondary | ICD-10-CM | POA: Insufficient documentation

## 2019-05-25 DIAGNOSIS — E039 Hypothyroidism, unspecified: Secondary | ICD-10-CM | POA: Insufficient documentation

## 2019-05-25 DIAGNOSIS — Y9301 Activity, walking, marching and hiking: Secondary | ICD-10-CM | POA: Diagnosis not present

## 2019-05-25 DIAGNOSIS — Z853 Personal history of malignant neoplasm of breast: Secondary | ICD-10-CM | POA: Diagnosis not present

## 2019-05-25 DIAGNOSIS — S59912A Unspecified injury of left forearm, initial encounter: Secondary | ICD-10-CM | POA: Diagnosis present

## 2019-05-25 IMAGING — CR DG ELBOW 2V*L*
3 series · 3 of 3 positions shown · non-contrast
Comparison: Forearm radiograph earlier this day demonstrating
probable radial neck fracture

CLINICAL DATA: Post fall today with diffuse elbow pain.

EXAM:
LEFT ELBOW - 2 VIEW

[x elbow ap left]
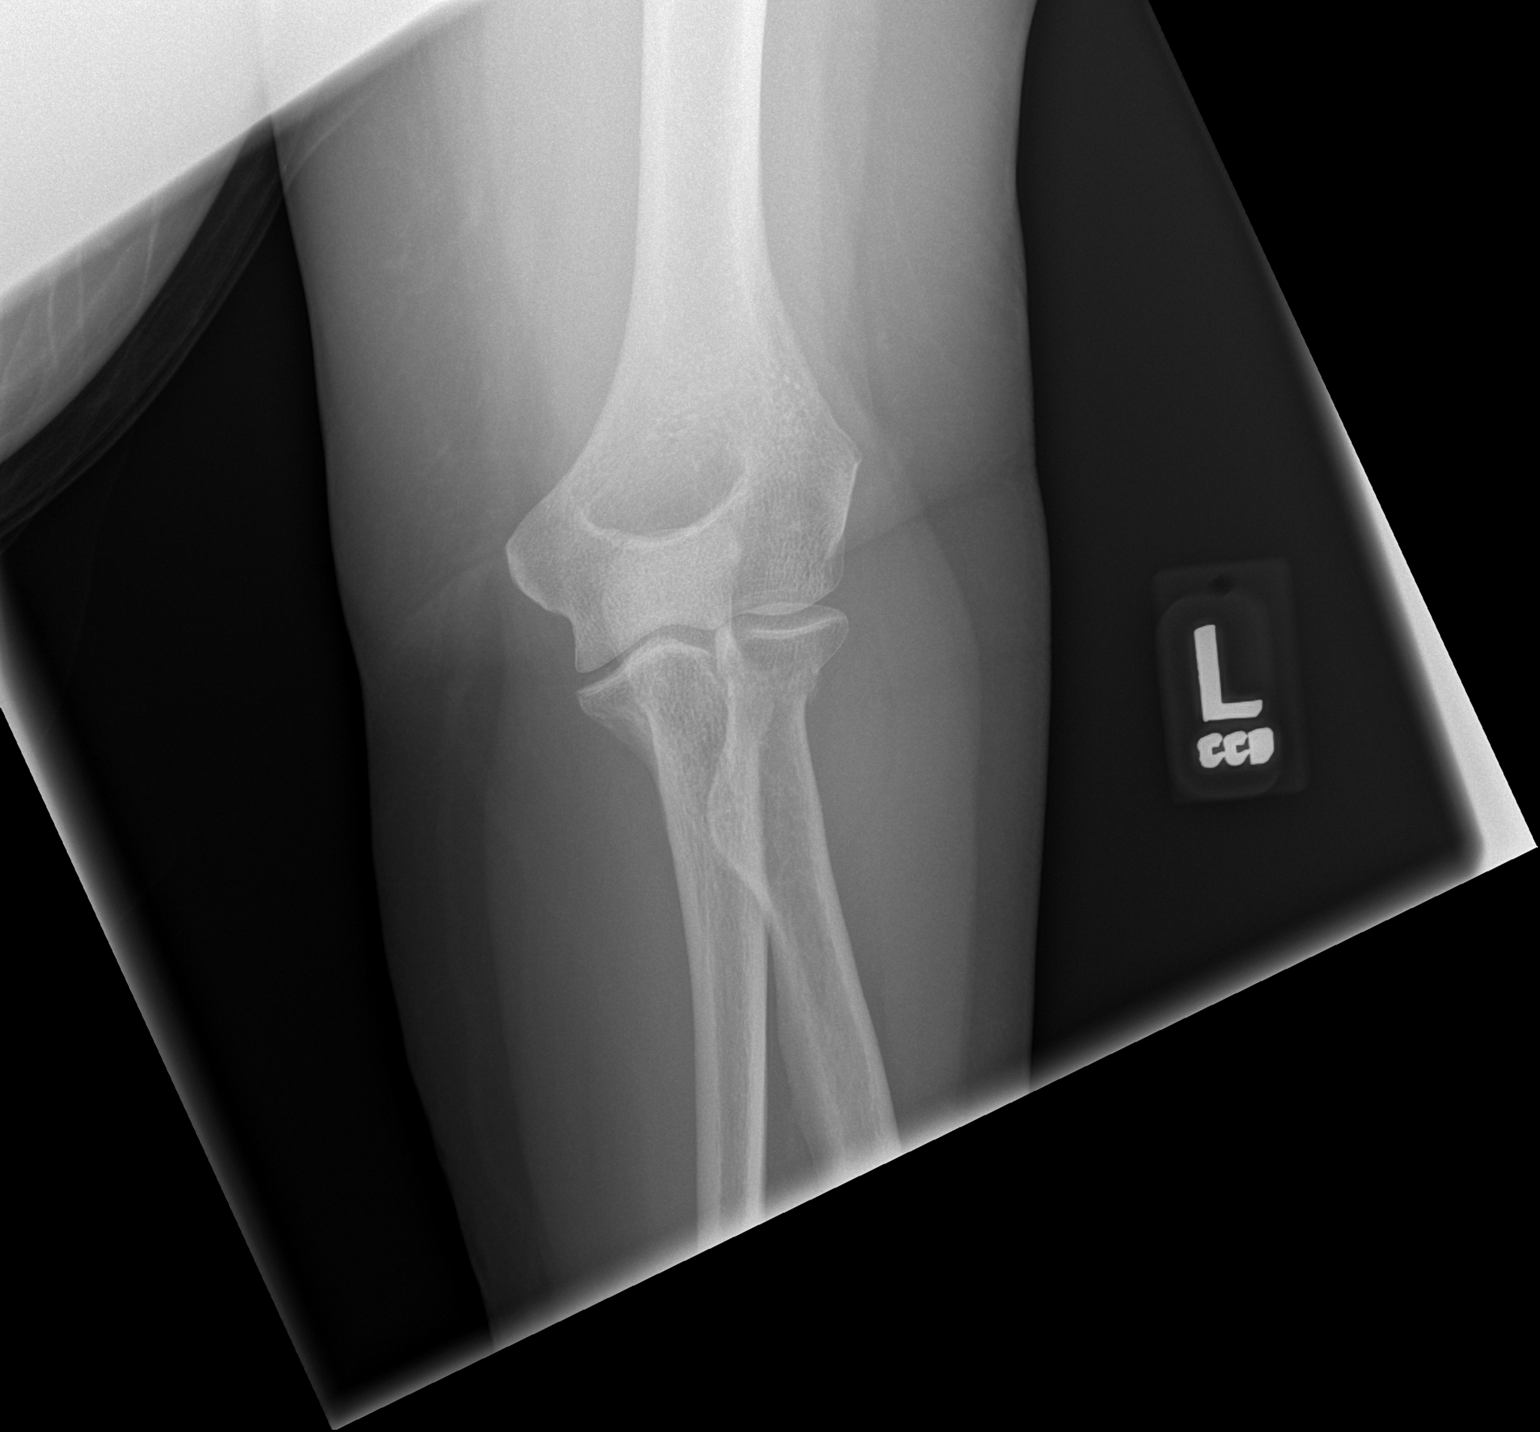

[x elbow lat left (1 of 2)]
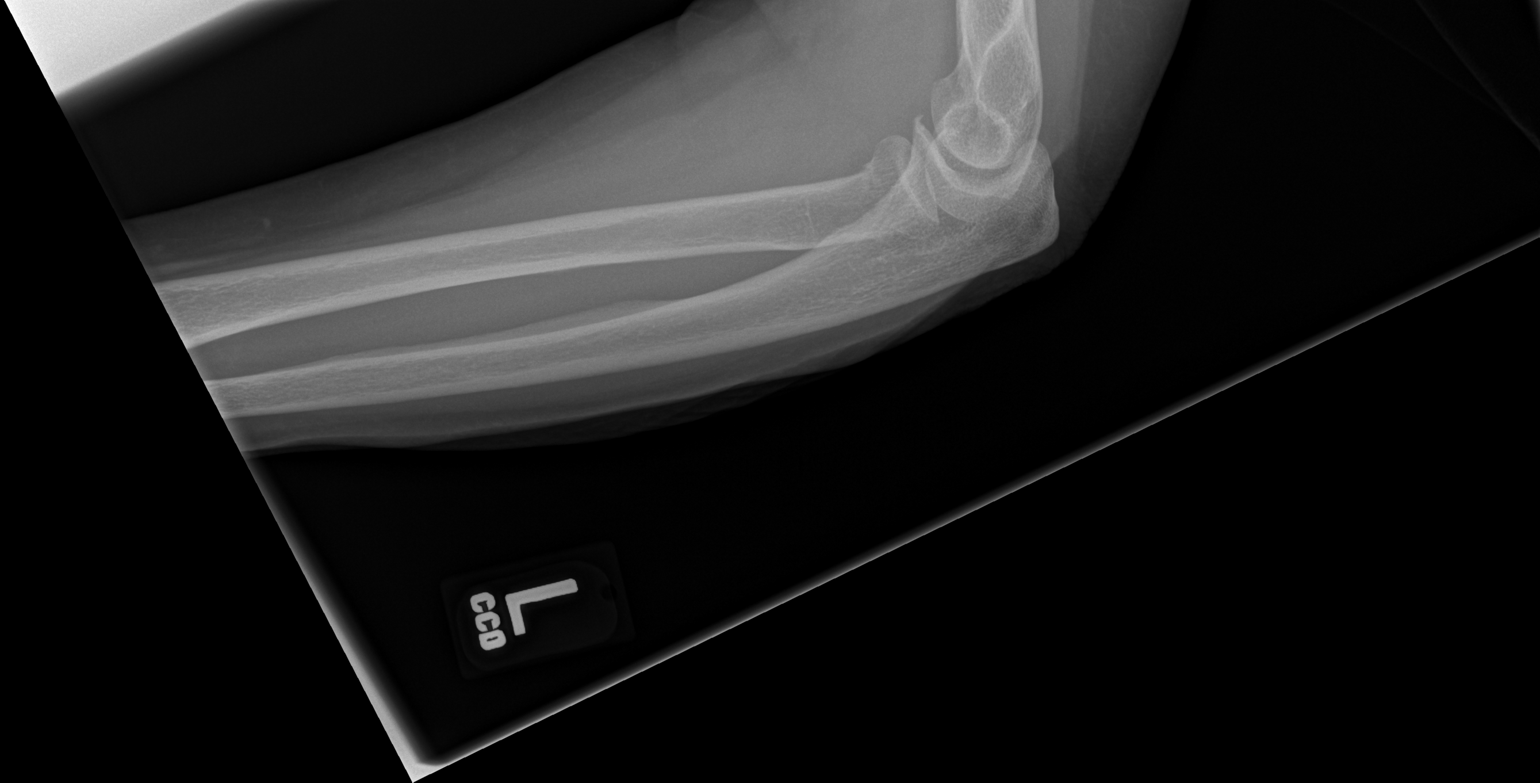

[x elbow lat left (2 of 2)]
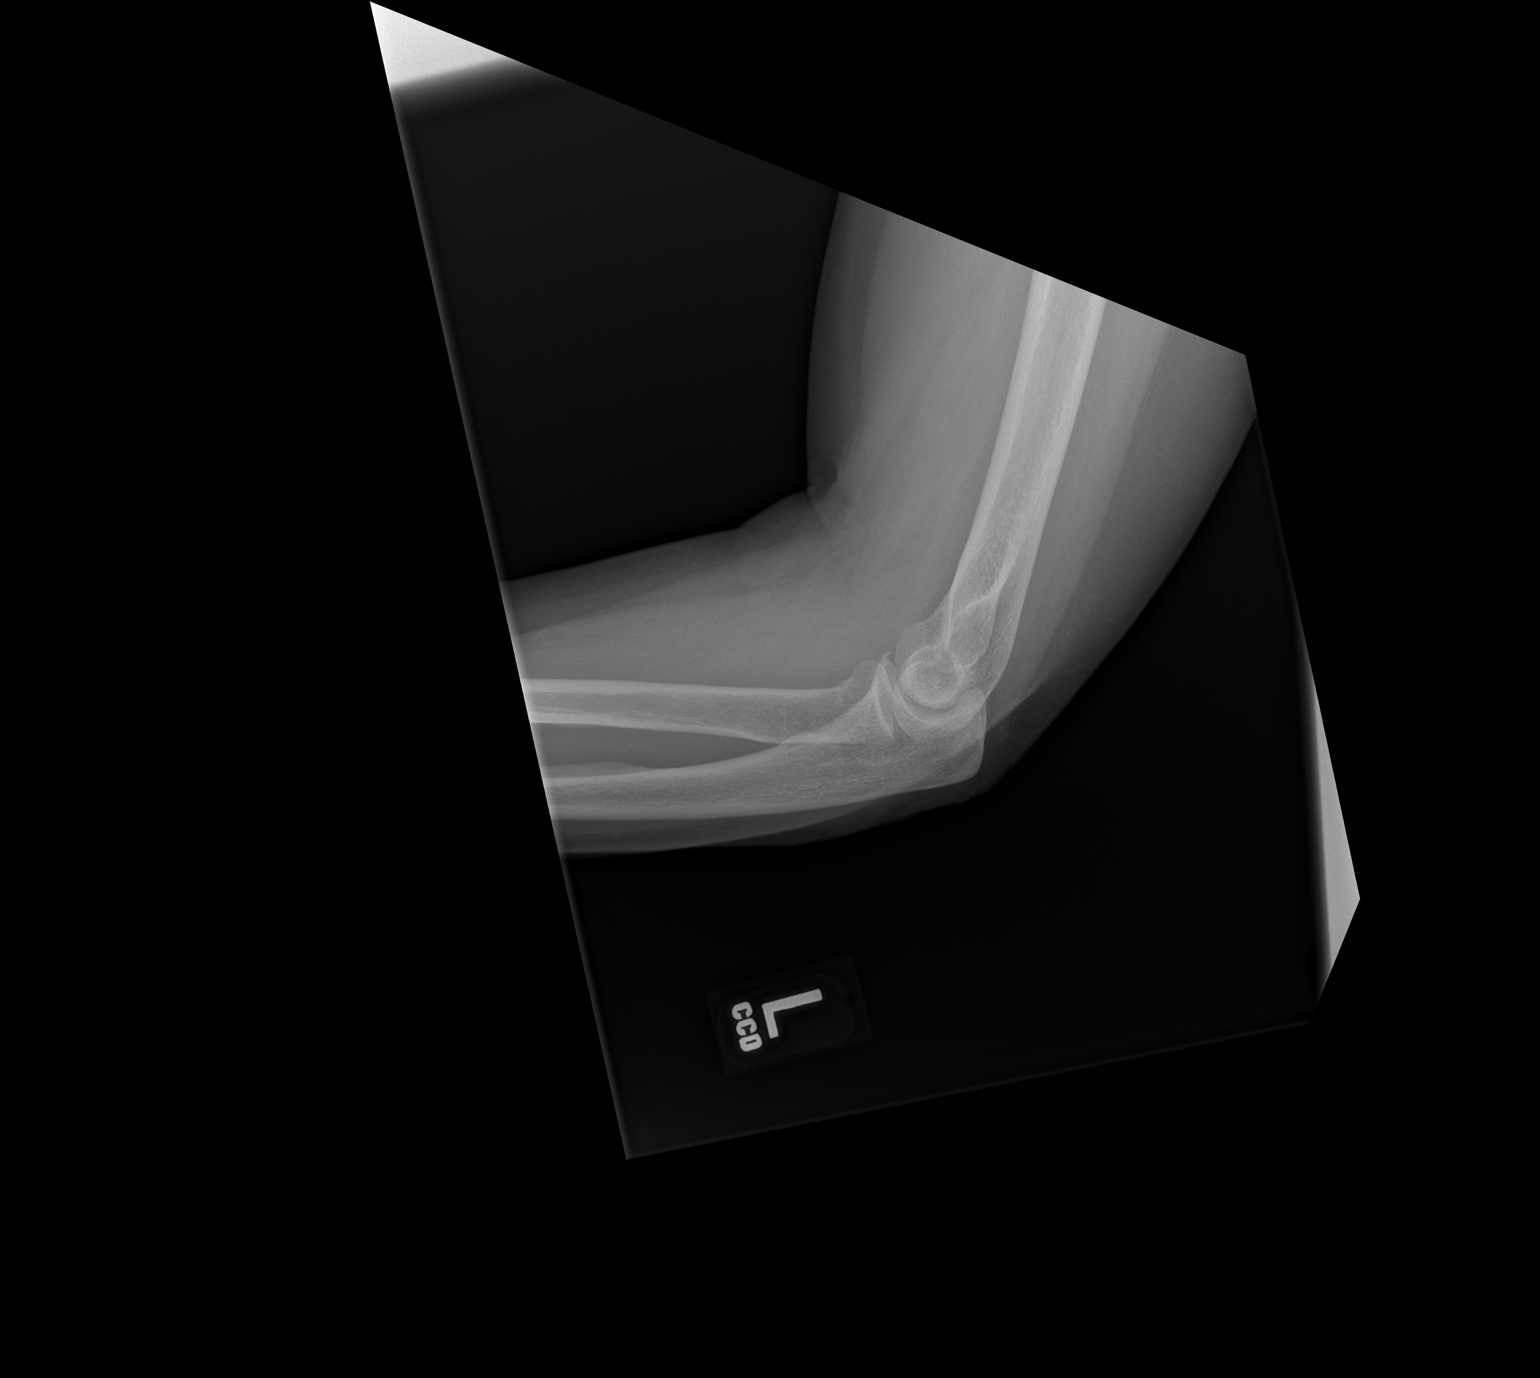

[3 of 3 positions shown; findings below may reference images not displayed]

FINDINGS: Minimally displaced radial neck fracture is confirmed. No definite
associated elbow joint effusion. No additional fracture of the
elbow.
IMPRESSION: Minimally displaced radial neck fracture.

## 2019-05-25 IMAGING — CR DG FOREARM 2V*L*
2 series · 2 of 2 positions shown · non-contrast
Comparison: None.

CLINICAL DATA: Fall today, pain.

EXAM:
LEFT FOREARM - 2 VIEW

[x forearm lat left]
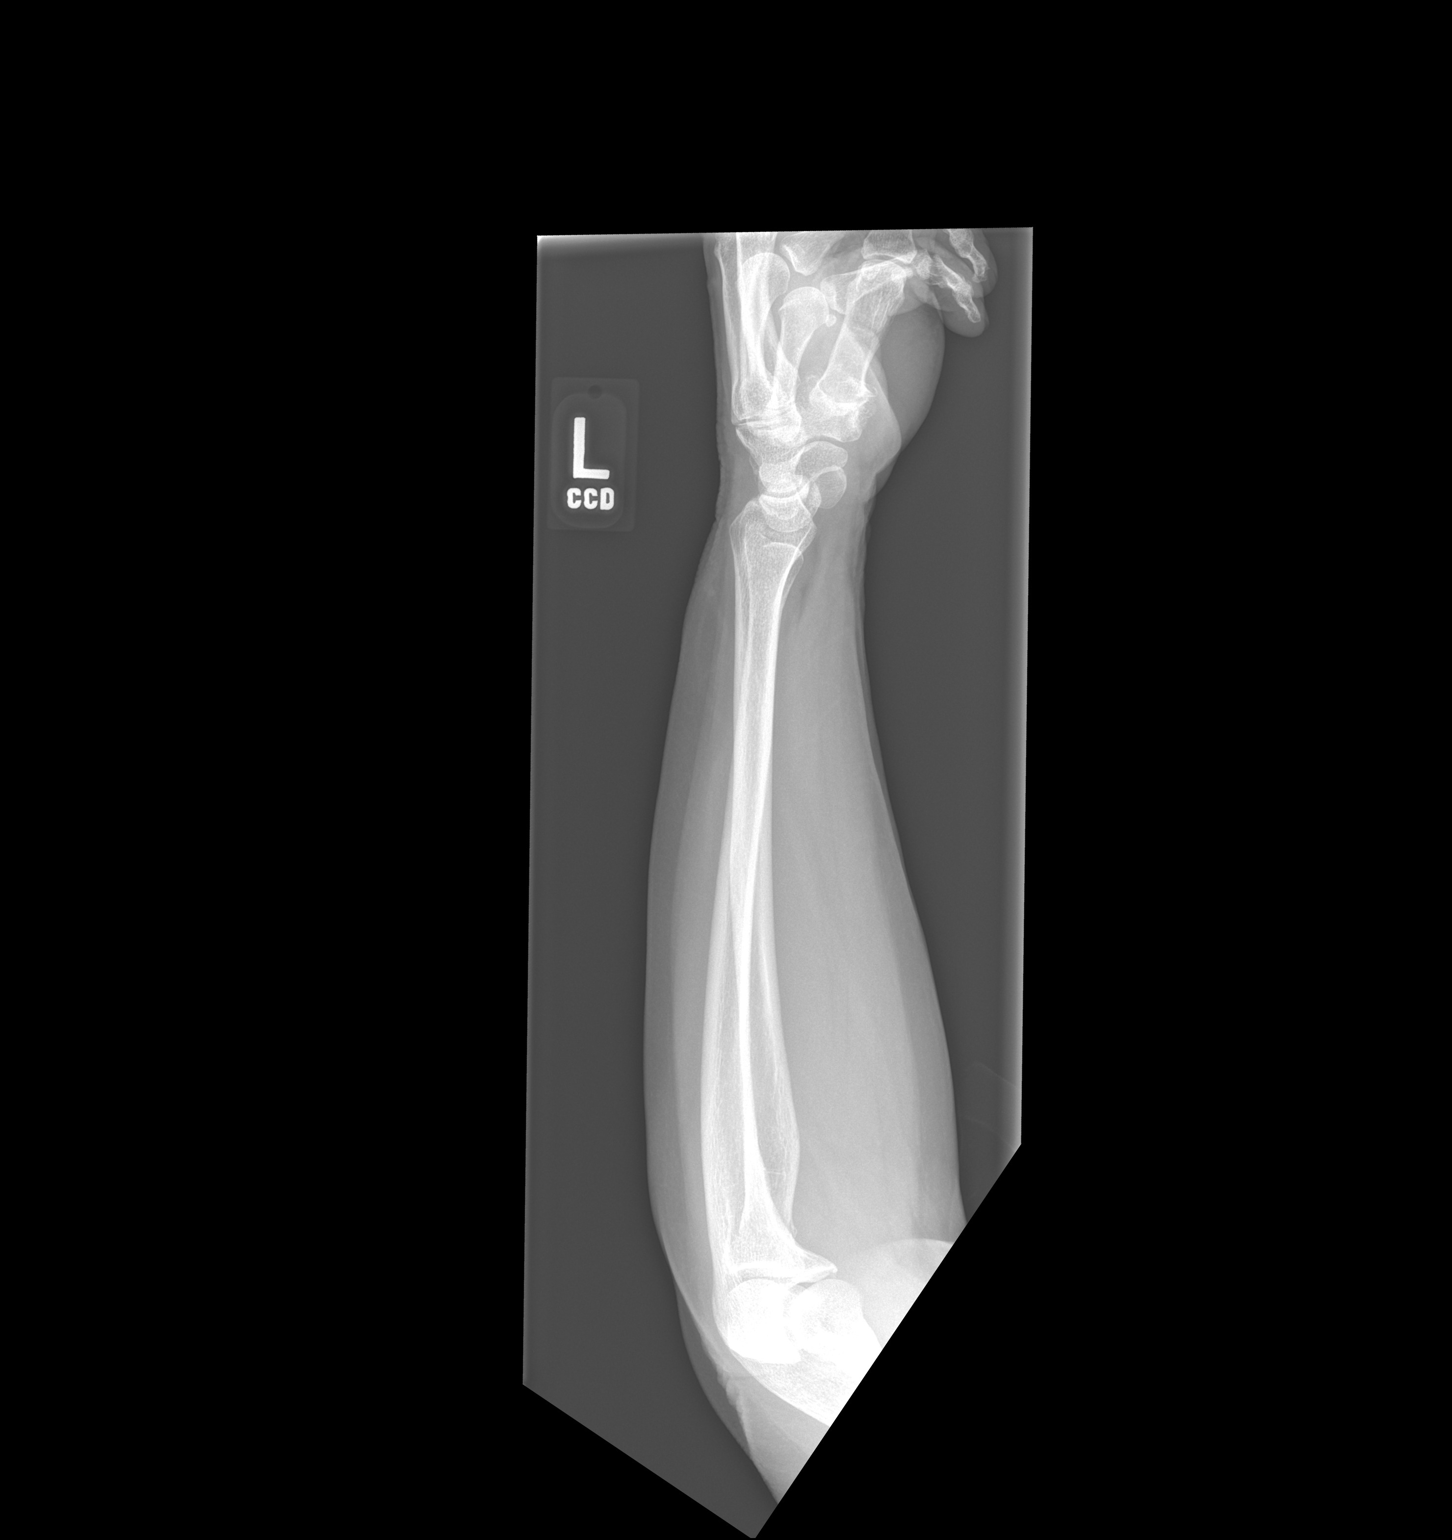

[x forearm ap left]
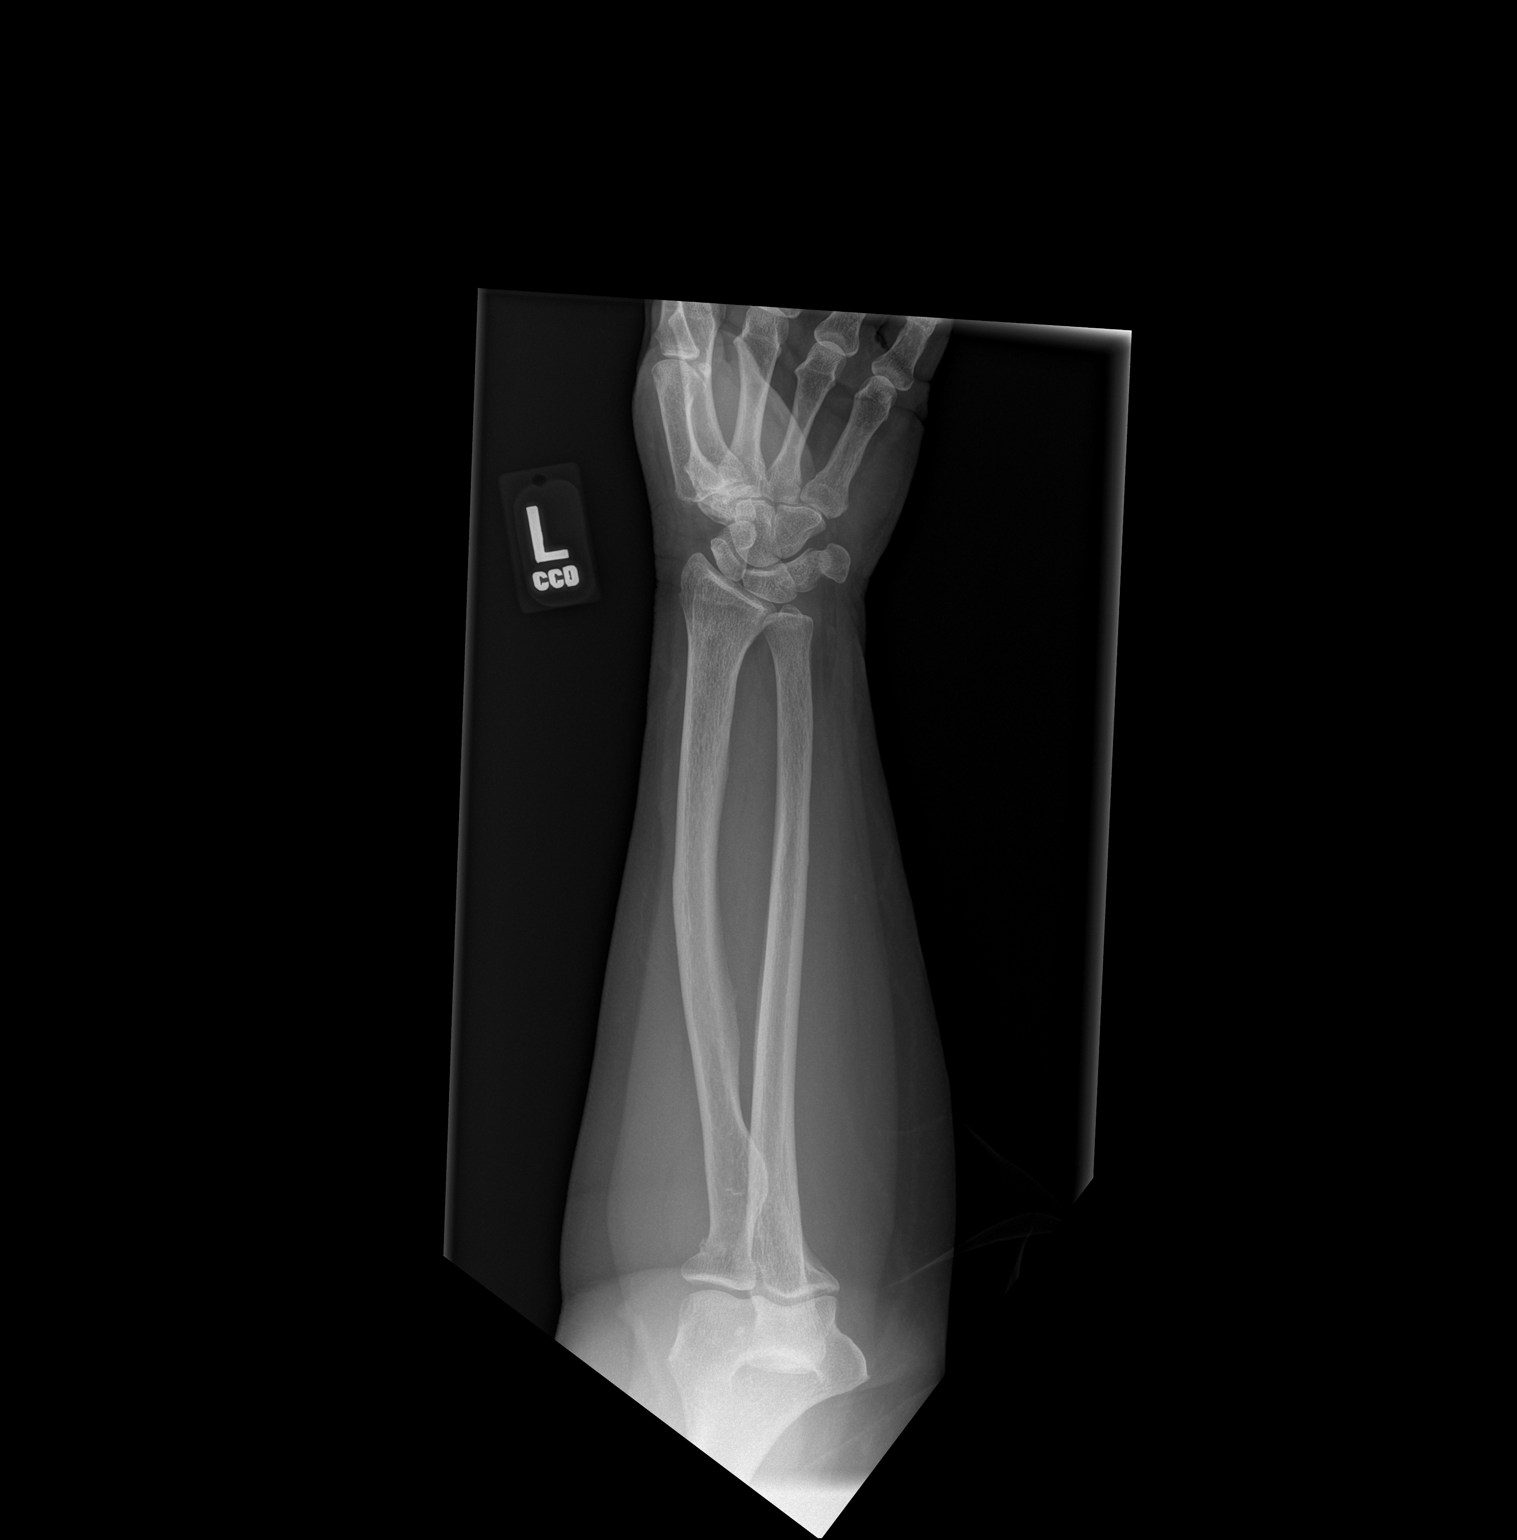

[2 of 2 positions shown; findings below may reference images not displayed]

FINDINGS: Subtle cortical deformity within the proximal LEFT radius, radial
neck region, suspicious for nondisplaced or minimally displaced
fracture. Remainder of the radius appears intact and normally
aligned. Ulna appears intact and normally aligned. Soft tissues
about the LEFT forearm are unremarkable.
IMPRESSION: Probable nondisplaced or minimally displaced fracture within the
proximal LEFT radius, at the level of the radial neck. Recommend
dedicated plain film of the LEFT elbow for more definitive
characterization.

## 2019-05-25 MED ORDER — FENTANYL CITRATE (PF) 100 MCG/2ML IJ SOLN
25.0000 ug | Freq: Once | INTRAMUSCULAR | Status: AC
  Administered 2019-05-25: 25 ug via INTRAVENOUS
  Filled 2019-05-25: qty 2

## 2019-05-25 MED ORDER — ONDANSETRON HCL 4 MG/2ML IJ SOLN
4.0000 mg | Freq: Once | INTRAMUSCULAR | Status: AC
  Administered 2019-05-25: 4 mg via INTRAVENOUS
  Filled 2019-05-25: qty 2

## 2019-05-25 MED ORDER — OXYCODONE-ACETAMINOPHEN 5-325 MG PO TABS: 1.0000 | ORAL_TABLET | Freq: Four times a day (QID) | ORAL | 0 refills | Status: DC | PRN

## 2019-05-25 MED ORDER — ONDANSETRON HCL 4 MG PO TABS: 4.0000 mg | ORAL_TABLET | Freq: Four times a day (QID) | ORAL | 0 refills | Status: DC | PRN

## 2019-05-25 NOTE — ED Triage Notes (Signed)
Pt BIB EMS from home. Pt tripped while walking dogs. Pt fell on left forearm and felt a "pop". Pt c/o pain in left forearm. Pt denies LOC.   18G R AC 100 mcg of fentanyl (in 25 mcg increments) 4 mg zofran  128/72 68 HR 18 Resp 98% RA

## 2019-05-25 NOTE — Discharge Instructions (Addendum)
You have been seen today after falling. Please read and follow all provided instructions. Return to the emergency room for worsening condition or new concerning symptoms.    The x-ray showed you have a minimally displaced radial neck fracture.  1. Medications:  Prescription sent to your pharmacy for Percocet.  This is a narcotic pain medicine.  Please take for severe pain only.  This medicine can make you drowsy so do not drive or work when taking.  This medication also has Tylenol in it so do not take any additional Tylenol at the same time. -You can take ibuprofen as needed for pain.  Please take as directed on the bottle. -If you are NOT taking the Percocet you can take Tylenol for pain.  Do not take more than 4 g of Tylenol in 24 hours.  Continue usual home medications Take medications as prescribed. Please review all of the medicines and only take them if you do not have an allergy to them.   2. Treatment: rest.  Wear the splint until you follow-up with the orthopedist.  You can wear the sling for comfort.  3. Follow Up:  Please follow up with the orthopedist Dr. Ninfa Linden.  Call his office tomorrow to schedule an appointment to be seen in 1 week.  When you call please say this is emergency department follow-up visit.   It is also a possibility that you have an allergic reaction to any of the medicines that you have been prescribed - Everybody reacts differently to medications and while MOST people have no trouble with most medicines, you may have a reaction such as nausea, vomiting, rash, swelling, shortness of breath. If this is the case, please stop taking the medicine immediately and contact your physician.  ?

## 2019-05-25 NOTE — ED Notes (Signed)
An After Visit Summary was printed and given to the patient. Discharge instructions given and no further questions at this time.  

## 2019-05-25 NOTE — ED Provider Notes (Signed)
[facility] DEPT Provider Note   CSN: [id number] Arrival date & time: 05/25/19  1339     History Chief Complaint  Patient presents with  . Left Forearm Pain    Bethany [name] is a 59 y.o. female past medical history as listed below presents to emergency department today via EMS after mechanical fall.  This happened proximately 1 hour prior to arrival.  Patient states she was walking outside with her dog when he went between her legs causing her to fall.  She landed on her outstretched left arm.  She denies hitting her head or loss of consciousness.  She was able to get up without assistance and continuity of her for help.  She is reporting constant aching pain in her left forearm that radiates to her elbow.  She rates pain 10 of 10 in severity.  She was given fentanyl by EMS in route and states that helped improve pain.  She is also endorsing tingling sensation in her left thumb.  No open wounds, no bleeding.  She denies head injury, headache, neck pain, visual changes, back pain, arthralgias. Patient is not anticoagulated.  History provided by patient with additional history obtained from chart review.      Past Medical History:  Diagnosis Date  . Anxiety   . Breast cancer ([place])    left breast cancer/diagnosed in 10/2017/taking radiation treatment until 03/15/18  . Breast mass, left 11/2017   going thru radiation until 02/2018  . CAD (coronary artery disease)   . Chest pain 06/19/2014  . COPD (chronic obstructive pulmonary disease) ([place])    beginning stages/small scar  . Dental crowns present   . History of hiatal hernia    no current med.  Marland Kitchen History of thyroid cancer 12/15/2017  . Hypertension    states under control with med., has been on med. x 1 yr.  . Hypothyroidism   . Ischemic chest pain ([place])   . Malignant neoplasm of upper-outer quadrant of left breast in female, estrogen receptor positive ([place])   . 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 MVP  (mitral valve prolapse)   . Personal history of radiation therapy   . PONV (postoperative nausea and vomiting)   . 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 Syncope and collapse 12/25/2008   Qualifier: Diagnosis of  By: Philemon Kingdom    . Urinary incontinence    USI   . Vitamin D deficiency     Patient Active Problem List   Diagnosis Date Noted  . History of thyroid cancer 12/15/2017  . 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 Ischemic chest pain ([place])   . Chest pain 06/19/2014  . Malignant neoplasm of upper-outer quadrant of left breast in female, estrogen receptor positive ([place])   . Vitamin D deficiency   . Urinary incontinence   . SYNCOPE AND COLLAPSE 12/25/2008    Past Surgical History:  Procedure Laterality Date  . ABDOMINAL HYSTERECTOMY     partial  . APPENDECTOMY    . AXILLARY SENTINEL NODE BIOPSY Left 12/09/2017   Procedure: AXILLARY SENTINEL NODE BIOPSY;  Surgeon: Erroll Luna, MD;  Location: [facility];  Service: General;  Laterality: Left;  . BREAST LUMPECTOMY Left 11/2017  . BREAST LUMPECTOMY WITH NEEDLE LOCALIZATION Left 11/25/2017   Procedure: LEFT BREAST LUMPECTOMY WITH NEEDLE LOCALIZATION;  Surgeon: Erroll Luna, MD;  Location: [facility];  Service: General;  Laterality: Left;  . COLONOSCOPY WITH PROPOFOL  10/03/2015  . LAPAROSCOPIC APPENDECTOMY  N/A 10/03/2015   Procedure: APPENDECTOMY LAPAROSCOPIC;  Surgeon: Rolm Bookbinder, MD;  Location: [facility];  Service: General;  Laterality: N/A;  . TOTAL THYROIDECTOMY  04/02/2003     OB History    Gravida  3   Para  3   Term  3   Preterm      AB      Living  3     SAB      TAB      Ectopic      Multiple      Live Births              Family History  Problem Relation Age of Onset  . Hypertension Father   . Heart disease Father   . Non-Hodgkin's lymphoma Father   . Hypertension Brother   . Heart disease Paternal Grandfather   . Colon cancer Other        great  maternal aunt   . Colon polyps Mother   . Stomach cancer Neg Hx   . Rectal cancer Neg Hx   . Esophageal cancer Neg Hx     Social History   Tobacco Use  . Smoking status: Former Smoker    Quit date: 08/14/2014    Years since quitting: 4.7  . Smokeless tobacco: Never Used  Substance Use Topics  . Alcohol use: Yes    Comment: rarely  . Drug use: No    Home Medications Prior to Admission medications   Medication Sig Start Date End Date Taking? Authorizing Provider  albuterol (VENTOLIN HFA) 108 (90 Base) MCG/ACT inhaler Inhale 2 puffs into the lungs every 6 (six) hours as needed for wheezing or shortness of breath. 11/24/18   Garner Nash, DO  aspirin EC 81 MG tablet Take 81 mg by mouth daily.    [provider]  ergocalciferol (VITAMIN D2) 50000 UNITS capsule Take 50,000 Units by mouth once a week. On Thurs & Sun    [provider]  ibuprofen (ADVIL) 200 MG tablet Take 600 mg by mouth every 6 (six) hours as needed for fever, headache or moderate pain.    [provider]  levothyroxine (SYNTHROID, LEVOTHROID) 137 MCG tablet Take 137 mcg by mouth at bedtime.     [provider]  losartan-hydrochlorothiazide (HYZAAR) 50-12.5 MG tablet Take 1 tablet by mouth daily. 10/21/18   [provider]  metoprolol tartrate (LOPRESSOR) 100 MG tablet Take 1 tablet (100 mg total) by mouth once for 1 dose. Take 2 hours prior to your cardiac CT. 05/15/19 05/15/19  Burnell Blanks, MD  Multiple Vitamin (MULTIVITAMIN WITH MINERALS) TABS tablet Take 1 tablet by mouth daily.    [provider]  omeprazole (PRILOSEC) 20 MG capsule Take 20 mg by mouth daily as needed (acid reflux).    [provider]  ondansetron (ZOFRAN) 4 MG tablet Take 1 tablet (4 mg total) by mouth every 6 (six) hours as needed for nausea or vomiting. 05/25/19   Fermina Mishkin, Harley Hallmark, PA-C  oxyCODONE-acetaminophen (PERCOCET/ROXICET) 5-325 MG tablet Take 1 tablet by mouth every 6  (six) hours as needed for severe pain. 05/25/19   Auden Wettstein E, PA-C  venlafaxine (EFFEXOR) 37.5 MG tablet Take 37.5 mg by mouth 2 (two) times daily.    [provider]    Allergies    Oxycodone, Latex, and Sulfa antibiotics  Review of Systems   Review of Systems All other systems are reviewed and are negative for acute change except as noted  in the HPI.  Physical Exam Updated Vital Signs BP 137/74   Pulse 70   Temp 98.2 F (36.8 C) (Oral)   Resp 18   SpO2 98%   Physical Exam Vitals and nursing note reviewed.  Constitutional:      Appearance: She is well-developed. She is not ill-appearing or toxic-appearing.  HENT:     Head: Normocephalic and atraumatic.     Comments: No tenderness to palpation of skull. No deformities or crepitus noted. No open wounds, abrasions or lacerations.    Nose: Nose normal.  Eyes:     General: No scleral icterus.       Right eye: No discharge.        Left eye: No discharge.     Conjunctiva/sclera: Conjunctivae normal.  Neck:     Vascular: No JVD.     Comments: Full ROM intact without spinous process TTP. No bony stepoffs or deformities, no paraspinous muscle TTP or muscle spasms. No rigidity or meningeal signs. No bruising, erythema, or swelling.  Cardiovascular:     Rate and Rhythm: Normal rate and regular rhythm.     Pulses: Normal pulses.     Heart sounds: Normal heart sounds.  Pulmonary:     Effort: Pulmonary effort is normal.     Breath sounds: Normal breath sounds.  Abdominal:     General: There is no distension.  Musculoskeletal:        General: Normal range of motion.     Cervical back: Normal range of motion.     Comments: Tenderness to palpation of left wrist and left forearm. Decreased ROM secondary to pain. Radial pulse 2+ bilaterally. Sensation intact to sharp and dull touch. Neurovascularly intact distally. Compartments soft above and below affected joint.   No tenderness to palpation of left elbow or  shoulder.  No midline tenderness to thoracic or lumbar spine.  No deformity or step-off.  No crepitus. Moving bilateral lower extremities without signs of injury. Pelvis is stable.  Ambulates with steady gait.  Skin:    General: Skin is warm and dry.     Capillary Refill: Capillary refill takes less than 2 seconds.  Neurological:     Mental Status: She is oriented to person, place, and time.     GCS: GCS eye subscore is 4. GCS verbal subscore is 5. GCS motor subscore is 6.     Comments: Fluent speech, no facial droop.  Psychiatric:        Behavior: Behavior normal.     ED Results / Procedures / Treatments   Labs (all labs ordered are listed, but only abnormal results are displayed) Labs Reviewed - No data to display  EKG None  Radiology DG Elbow 2 Views Left  Result Date: 05/25/2019 CLINICAL DATA:  Post fall today with diffuse elbow pain. EXAM: LEFT ELBOW - 2 VIEW COMPARISON:  Forearm radiograph earlier this day demonstrating probable radial neck fracture FINDINGS: Minimally displaced radial neck fracture is confirmed. No definite associated elbow joint effusion. No additional fracture of the elbow. IMPRESSION: Minimally displaced radial neck fracture. Electronically Signed   By: Keith Rake M.D.   On: 05/25/2019 14:46   DG Forearm Left  Result Date: 05/25/2019 CLINICAL DATA:  Fall today, pain. EXAM: LEFT FOREARM - 2 VIEW COMPARISON:  None. FINDINGS: Subtle cortical deformity within the proximal LEFT radius, radial neck region, suspicious for nondisplaced or minimally displaced fracture. Remainder of the radius appears intact and normally aligned. Ulna appears intact and normally aligned. Soft  tissues about the LEFT forearm are unremarkable. IMPRESSION: Probable nondisplaced or minimally displaced fracture within the proximal LEFT radius, at the level of the radial neck. Recommend dedicated plain film of the LEFT elbow for more definitive characterization. Electronically Signed    By: Franki Cabot M.D.   On: 05/25/2019 14:23    Procedures Procedures (including critical care time)  Medications Ordered in ED Medications  fentaNYL (SUBLIMAZE) injection 25 mcg (has no administration in time range)    ED Course  I have reviewed the triage vital signs and the nursing notes.  Pertinent labs & imaging results that were available during my care of the patient were reviewed by me and considered in my medical decision making (see chart for details).    MDM Rules/Calculators/A&P                      Patient seen and examined. Patient presents awake, alert, hemodynamically stable, afebrile, non toxic. On exam she has no signs of serious head, neck or back injury. She has tenderness to left forearm. Neurovascularly intact distally. Compartments soft above and below affected joint.  Ambulates with steady gait.   Patient X-Ray viewed by me with minimally displaced radial neck fracture. Pain managed in ED. Case discussed with on-call orthopedist Dr. Ninfa Linden who recommends posterior splint and sling with patient to follow-up in office in 1 week.   Pain managed in the department. Discussed conservative therapy recommended and discussed. I have reviewed the PDMP during this encounter. Patient has no recent narcotic prescriptions.  Will discharge with short course of Percocet for severe pain.  Also will write for Zofran as she has nausea with pain medicine typically.  Discussed with patient she should take ibuprofen and Tylenol for mild to moderate pain without exceeding 4 g of Tylenol in 24 hours.  Patient evaluated after splint application and remains neurologically intact.  Patient will be dc home & is agreeable with above plan. I have also discussed reasons to return immediately to the ER.  Patient expresses understanding and agrees with plan. Findings and plan of care discussed with supervising physician Dr. Gilford Raid   Portions of this note were generated with Dragon dictation  software. Dictation errors may occur despite best attempts at proofreading.    Final Clinical Impression(s) / ED Diagnoses Final diagnoses:  Closed displaced fracture of neck of left radius, initial encounter    Rx / DC Orders ED Discharge Orders         Ordered    oxyCODONE-acetaminophen (PERCOCET/ROXICET) 5-325 MG tablet  Every 6 hours PRN     05/25/19 1539    ondansetron (ZOFRAN) 4 MG tablet  Every 6 hours PRN     05/25/19 1539           Salmaan Patchin, Harley Hallmark, PA-C 05/25/19 1611    Isla Pence, MD 05/27/19 0710

## 2019-06-01 ENCOUNTER — Ambulatory Visit: Payer: Self-pay

## 2019-06-01 ENCOUNTER — Other Ambulatory Visit: Payer: Self-pay

## 2019-06-01 ENCOUNTER — Ambulatory Visit (INDEPENDENT_AMBULATORY_CARE_PROVIDER_SITE_OTHER): Payer: 59 | Admitting: Orthopaedic Surgery

## 2019-06-01 ENCOUNTER — Encounter: Payer: Self-pay | Admitting: Orthopaedic Surgery

## 2019-06-01 DIAGNOSIS — S52132A Displaced fracture of neck of left radius, initial encounter for closed fracture: Secondary | ICD-10-CM | POA: Diagnosis not present

## 2019-06-01 DIAGNOSIS — M25532 Pain in left wrist: Secondary | ICD-10-CM

## 2019-06-01 MED ORDER — OXYCODONE-ACETAMINOPHEN 5-325 MG PO TABS: 1.0000 | ORAL_TABLET | Freq: Four times a day (QID) | ORAL | 0 refills | Status: DC | PRN

## 2019-06-01 NOTE — Progress Notes (Signed)
Office Visit Note   Patient: Bethany [name]           Date of Birth: 13-Apr-1960           MRN: [id number] Visit Date: 06/01/2019              Requested by: Leeroy Cha, MD [address] [place],  [place] [id number] PCP: Leeroy Cha, MD   Assessment & Plan: Visit Diagnoses:  1. Closed displaced fracture of neck of left radius, initial encounter   2. Pain in left wrist     Plan: She understands that we can treat this injury conservatively.  She can come in and out of the splint to work on gentle range of motion of her left elbow.  If she decides to go without the splint she can wear a sling coming in and out of it as comfort allows.  She knows to avoid heavy lifting.  I would like to see her back in 2 and half weeks with a repeat AP and lateral of the left elbow.  All questions and concerns were answered and addressed.  We will send in some more oxycodone for pain.  Follow-Up Instructions: No follow-ups on file.   Orders:  Orders Placed This Encounter  Procedures  . XR Elbow 2 Views Left  . XR Wrist 2 Views Left   No orders of the defined types were placed in this encounter.     Procedures: No procedures performed   Clinical Data: No additional findings.   Subjective: Chief Complaint  Patient presents with  . Left Elbow - Injury, Pain  The patient is a very pleasant 59 year old female who sustained a mechanical fall a week ago when she was dealing with a dog and tripped.  She landed on her left elbow injuring her left elbow and her left wrist and thumb.  She does report severe thumb pain she points about her thumb joint.  She said has been going on for years and has been slowly getting worse.  The elbow pain is from her current fracture.  She does have a known nondisplaced radial neck fracture on the left side.  She is right-hand dominant.  She was placed appropriate in a splint and given follow-up in her office today.  She denies any  numbness and tingling in her hand.  She does report significant pain.  HPI  Review of Systems She currently denies any headache, chest pain, shortness of breath, fever, chills, nausea, vomiting  Objective: Vital Signs: There were no vitals taken for this visit.  Physical Exam She is alert and orient x3 and in no acute distress Ortho Exam  examination of her left elbow does show pain with range of motion and palpation.  There is swelling around the elbow and bruising.  There is also swelling and bruising around the base of her left thumb.  There is a positive grind test and severe pain with the thumb.  Her left wrist is ligamentously stable.  Her left hand is well-perfused and neurovascularly intact. Specialty Comments:  No specialty comments available.  Imaging: XR Elbow 2 Views Left  Result Date: 06/01/2019 2 views left elbow show a nondisplaced radial neck fracture.  The elbow is otherwise well located.  XR Wrist 2 Views Left  Result Date: 06/01/2019 2 views of the left wrist show no fracture or dislocation.  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    PMFS History: Patient Active Problem List  Diagnosis Date Noted  . History of thyroid cancer 12/15/2017  . 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 Ischemic chest pain ([place])   . Chest pain 06/19/2014  . Malignant neoplasm of upper-outer quadrant of left breast in female, estrogen receptor positive ([place])   . Vitamin D deficiency   . Urinary incontinence   . SYNCOPE AND COLLAPSE 12/25/2008   Past Medical History:  Diagnosis Date  . Anxiety   . Breast cancer ([place])    left breast cancer/diagnosed in 10/2017/taking radiation treatment until 03/15/18  . Breast mass, left 11/2017   going thru radiation until 02/2018  . CAD (coronary artery disease)   . Chest pain 06/19/2014  . COPD (chronic obstructive pulmonary disease) ([place])    beginning stages/small scar  . Dental crowns present   . History of hiatal hernia     no current med.  Marland Kitchen History of thyroid cancer 12/15/2017  . Hypertension    states under control with med., has been on med. x 1 yr.  . Hypothyroidism   . Ischemic chest pain ([place])   . Malignant neoplasm of upper-outer quadrant of left breast in female, estrogen receptor positive ([place])   . 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 MVP (mitral valve prolapse)   . Personal history of radiation therapy   . PONV (postoperative nausea and vomiting)   . Radiation fibrosis of lung ([place])   . Syncope and collapse 12/25/2008   Qualifier: Diagnosis of  By: Philemon Kingdom    . Urinary incontinence    USI   . Vitamin D deficiency     Family History  Problem Relation Age of Onset  . Hypertension Father   . Heart disease Father   . Non-Hodgkin's lymphoma Father   . Hypertension Brother   . Heart disease Paternal Grandfather   . Colon cancer Other        great maternal aunt   . Colon polyps Mother   . Stomach cancer Neg Hx   . Rectal cancer Neg Hx   . Esophageal cancer Neg Hx     Past Surgical History:  Procedure Laterality Date  . ABDOMINAL HYSTERECTOMY     partial  . APPENDECTOMY    . AXILLARY SENTINEL NODE BIOPSY Left 12/09/2017   Procedure: AXILLARY SENTINEL NODE BIOPSY;  Surgeon: Erroll Luna, MD;  Location: [facility];  Service: General;  Laterality: Left;  . BREAST LUMPECTOMY Left 11/2017  . BREAST LUMPECTOMY WITH NEEDLE LOCALIZATION Left 11/25/2017   Procedure: LEFT BREAST LUMPECTOMY WITH NEEDLE LOCALIZATION;  Surgeon: Erroll Luna, MD;  Location: [facility];  Service: General;  Laterality: Left;  . COLONOSCOPY WITH PROPOFOL  10/03/2015  . LAPAROSCOPIC APPENDECTOMY N/A 10/03/2015   Procedure: APPENDECTOMY LAPAROSCOPIC;  Surgeon: Rolm Bookbinder, MD;  Location: [facility];  Service: General;  Laterality: N/A;  . TOTAL THYROIDECTOMY  04/02/2003   Social History   Occupational History  . Occupation: Self employed  Tobacco Use  . Smoking status: Former  Smoker    Quit date: 08/14/2014    Years since quitting: 4.8  . Smokeless tobacco: Never Used  Substance and Sexual Activity  . Alcohol use: Yes    Comment: rarely  . Drug use: No  . Sexual activity: Yes    Partners: Male

## 2019-06-02 ENCOUNTER — Ambulatory Visit: Payer: 59

## 2019-06-05 ENCOUNTER — Ambulatory Visit (HOSPITAL_COMMUNITY): Payer: 59 | Attending: Cardiovascular Disease

## 2019-06-05 ENCOUNTER — Other Ambulatory Visit: Payer: Self-pay

## 2019-06-05 DIAGNOSIS — R079 Chest pain, unspecified: Secondary | ICD-10-CM

## 2019-06-06 ENCOUNTER — Encounter: Payer: Self-pay | Admitting: *Deleted

## 2019-06-11 ENCOUNTER — Ambulatory Visit: Payer: 59 | Attending: Internal Medicine

## 2019-06-11 DIAGNOSIS — Z23 Encounter for immunization: Secondary | ICD-10-CM

## 2019-06-11 NOTE — Progress Notes (Signed)
[place]  [phone] [phone]     ID: [name] Heitman Virani DOB: 03/08/1961  MR#: [id number]  MLJ#:[id number]  Patient Care Team: Leeroy Cha, MD as PCP - General (Internal Medicine) Erroll Luna, MD as Consulting Physician (General Surgery) Etienne Millward, Virgie Dad, MD as Consulting Physician (Oncology) Kyung Rudd, MD as Consulting Physician (Radiation Oncology) Irene Shipper, MD as Consulting Physician (Gastroenterology) [name], MD as Consulting Physician (Obstetrics and Gynecology) Delrae Rend, MD as Consulting Physician (Endocrinology) Mauro Kaufmann, RN as Oncology Nurse Navigator Rockwell Germany, RN as Oncology Nurse Navigator Icard, Octavio Graves, DO as Consulting Physician (Pulmonary Disease) OTHER MD:   CHIEF COMPLAINT: Estrogen receptor positive breast cancer  CURRENT TREATMENT: tamoxifen at 10 mg/ d   INTERVAL HISTORY: Bethany [name] was seen today for follow-up and treatment of her estrogen receptor positive breast cancer.   She discontinued tamoxifen on her own initiative in December 2020. She felt it was causing her hot flashes, cramps, and headaches as well as perhaps increased risk of falling. After going off the drug the hot flashes and falling persist but the cramps and the headaches are gone.  Since her last visit, she underwent chest CT on 11/04/2018 showing: findings consistent with minimal radiation fibrosis.  She also underwent bilateral diagnostic mammography with tomography at [facility] on 11/09/2018 showing: breast density category B; no evidence of malignancy in either breast.   She also presented to the ED on 04/03/2019 with worsening intermittent chest pain. She underwent chest x-ray at that time, which was negative.  She palpated an abnormality in the outer right breast. She underwent right diagnostic mammogram and right breast ultrasound on 04/26/2019 showing: breast density category C; indeterminate  11 mm right breast mass at 10 o'clock; no suspicious abnormality at the site of palpable concern; no right axillary adenopathy.   She proceeded to biopsy of the right breast area in question on 05/15/2019. Pathology [id number]) showed intraductal papilloma. She is scheduled for right lumpectomy on 06/29/2019 under Dr. Brantley Stage.  She also fell while walking her dog on 05/25/2019. She was found to have a minimally displaced radial neck fracture. She was referred to Dr. Ninfa Linden, whom she saw on 06/01/2019. She was recommended to wear a splint or sling and to take it off to work on range of motion exercises. She is scheduled to follow up with him on 06/26/2019.   REVIEW OF SYSTEMS: Bethany [name] received her first Covid vaccine dose [employer]) yesterday June 11, 2019. She tolerated it well. She is not exercising regularly. She has of course her left arm wrapped up and has limited mobility there. She tells me Dr. Dellis Filbert is giving her increased doses of vitamin D. She denies current headaches, visual changes, nausea, vomiting, or vertigo. She has no peripheral neuropathy. A detailed review of systems was otherwise stable.    HISTORY OF CURRENT ILLNESS: The original intake note:  "Bethany [name]" had routine screening mammography on 10/26/2017 showing a possible abnormality in the left breast. She underwent unilateral left diagnostic mammography with tomography and left breast ultrason[facility] on 11/03[facility]2017 showing: breast density category B. There was a hypoechoic lesion consistent of a mass at the 2:30 o'clock upper outer quadrant middle depth and measuring 0.5 x 0.3 x 0.4 cm. Sonographic evaluation of the left axilla shows no enlarged or abnormal lymph nodes.  An attempt was made to obtain a biopsy of this lesion on 11/11/2017, however the patient had repeated episodes of syncope during the  attempted procedure.  She underwent left lumpectomy of the lesion on 11/25/2017 showing ([id number]): Invasive  ductal carcinoma, grade I spanning 1.0 cm. Margins were negative for carcinoma. Prognostic indicators significant for: estrogen receptor, 90% positive and progesterone receptor, 80% positive, both with strong staining intensity. Proliferation marker Ki67 at 3%. HER2 negative with an immunohistochemistry of (1+).  Then in a separate procedure she underwent left sentinel lymph node sampling on 12/09/2017 showing ([id number]): Four left axillary sentinel lymph were negative for carcinoma. (0/4).   Her subsequent history is as detailed below.   PAST MEDICAL HISTORY: Past Medical History:  Diagnosis Date  . Anxiety   . Breast cancer ([place])    left breast cancer/diagnosed in 10/2017/taking radiation treatment until 03/15/18  . Breast mass, left 11/2017   going thru radiation until 02/2018  . CAD (coronary artery disease)   . Chest pain 06/19/2014  . COPD (chronic obstructive pulmonary disease) ([place])    beginning stages/small scar  . Dental crowns present   . History of hiatal hernia    no current med.  Marland Kitchen History of thyroid cancer 12/15/2017  . Hypertension    states under control with med., has been on med. x 1 yr.  . Hypothyroidism   . Ischemic chest pain ([place])   . Malignant neoplasm of upper-outer quadrant of left breast in female, estrogen receptor positive ([place])   . 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 MVP (mitral valve prolapse)   . Personal history of radiation therapy   . PONV (postoperative nausea and vomiting)   . 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 Syncope and collapse 12/25/2008   Qualifier: Diagnosis of  By: Philemon Kingdom    . Urinary incontinence    USI   . Vitamin D deficiency   Thyroid cancer, GERD   PAST SURGICAL HISTORY: Past Surgical History:  Procedure Laterality Date  . ABDOMINAL HYSTERECTOMY     partial  . APPENDECTOMY    . AXILLARY SENTINEL NODE BIOPSY Left 12/09/2017   Procedure: AXILLARY SENTINEL NODE BIOPSY;  Surgeon: Erroll Luna, MD;  Location:  [place];  Service: General;  Laterality: Left;  . BREAST LUMPECTOMY Left 11/2017  . BREAST LUMPECTOMY WITH NEEDLE LOCALIZATION Left 11/25/2017   Procedure: LEFT BREAST LUMPECTOMY WITH NEEDLE LOCALIZATION;  Surgeon: Erroll Luna, MD;  Location: [place];  Service: General;  Laterality: Left;  . COLONOSCOPY WITH PROPOFOL  10/03/2015  . LAPAROSCOPIC APPENDECTOMY N/A 10/03/2015   Procedure: APPENDECTOMY LAPAROSCOPIC;  Surgeon: Rolm Bookbinder, MD;  Location: [place];  Service: General;  Laterality: N/A;  . TOTAL THYROIDECTOMY  04/02/2003  Hysterectomy without BSO   FAMILY HISTORY Family History  Problem Relation Age of Onset  . Hypertension Father   . Heart disease Father   . Non-Hodgkin's lymphoma Father   . Hypertension Brother   . Heart disease Paternal Grandfather   . Colon cancer Other        great maternal aunt   . Colon polyps Mother   . Stomach cancer Neg Hx   . Rectal cancer Neg Hx   . Esophageal cancer Neg Hx    The patient's father died in 04/07/2017 age age 103 due to non-Hodgkin's lymphoma. The patient's mother is alive at age 1 as of October 2019. The patient has 1 brother, no sisters. There was a paternal aunt with lung cancer. There was a paternal grandmother and maternal grandfather with throat cancer. She denies a family history of breast or ovarian cancer.  GYNECOLOGIC HISTORY:  No LMP recorded. Patient has had a hysterectomy. Menarche: 59 years old Age at first live birth: 59 years old She is GX P3.  She is status post partial hysterectomy (without BSO) in her late 45's. She did not have HRT.    SOCIAL HISTORY: (updated 09/2018) Atoya owns a jewelry business. Her husband, Patrick Jupiter, is a Clinical cytogeneticist for [employer]. At home is her, her husband, and their two dogs. The patient's oldest son, [place], has 3 children and lives in [place] and works in Nurse, children's for [employer]. The patient's son, Alpha Gula lives in [place] with  1 daughter and works as a Teacher, music. The patient's youngest son, Aline Brochure lives in [place] and works for [employer] in [place]. The patient has 5 grandchildren total. Her grandchildren are ages 27, 14, 58, 82, and 3. She notes the youngest has Down Syndrome and recently turned 3. She does not currently belong to a church, but she is Psychologist, forensic.     ADVANCED DIRECTIVES:    HEALTH MAINTENANCE: Social History   Tobacco Use  . Smoking status: Former Smoker    Quit date: 08/14/2014    Years since quitting: 4.8  . Smokeless tobacco: Never Used  Substance Use Topics  . Alcohol use: Yes    Comment: rarely  . Drug use: No    Colonoscopy: 03/15/2018- to be repeated in 3 years under Dr. Henrene Pastor  PAP:   Bone density: 10/25/2017 showed osteopenia   Allergies  Allergen Reactions  . Oxycodone Nausea And Vomiting  . Latex Rash  . Sulfa Antibiotics     hives    Current Outpatient Medications  Medication Sig Dispense Refill  . albuterol (VENTOLIN HFA) 108 (90 Base) MCG/ACT inhaler Inhale 2 puffs into the lungs every 6 (six) hours as needed for wheezing or shortness of breath. 18 g 6  . aspirin EC 81 MG tablet Take 81 mg by mouth daily.    . ergocalciferol (VITAMIN D2) 50000 UNITS capsule Take 50,000 Units by mouth once a week. On Thurs & Sun    . ibuprofen (ADVIL) 200 MG tablet Take 600 mg by mouth every 6 (six) hours as needed for fever, headache or moderate pain.    Marland Kitchen levothyroxine (SYNTHROID, LEVOTHROID) 137 MCG tablet Take 137 mcg by mouth at bedtime.     Marland Kitchen losartan-hydrochlorothiazide (HYZAAR) 50-12.5 MG tablet Take 1 tablet by mouth daily.    . metoprolol tartrate (LOPRESSOR) 100 MG tablet Take 1 tablet (100 mg total) by mouth once for 1 dose. Take 2 hours prior to your cardiac CT. 1 tablet 0  . Multiple Vitamin (MULTIVITAMIN WITH MINERALS) TABS tablet Take 1 tablet by mouth daily.    Marland Kitchen omeprazole (PRILOSEC) 20 MG capsule Take 20 mg by mouth daily as needed (acid reflux).    . ondansetron  (ZOFRAN) 4 MG tablet Take 1 tablet (4 mg total) by mouth every 6 (six) hours as needed for nausea or vomiting. 12 tablet 0  . venlafaxine XR (EFFEXOR XR) 150 MG 24 hr capsule Take 1 capsule (150 mg total) by mouth daily with breakfast. 60 capsule 6   Current Facility-Administered Medications  Medication Dose Route Frequency Provider Last Rate Last Admin  . 0.9 %  sodium chloride infusion  500 mL Intravenous Once Irene Shipper, MD        OBJECTIVE: Middle-aged white woman who appears stated age  85:   06/12/19 1044  BP: (!) 142/89  Pulse: 63  Resp: 17  Temp: 98.3 F (  36.8 C)  SpO2: 99%     Body mass index is 32.82 kg/m.   Wt Readings from Last 3 Encounters:  06/12/19 197 lb 3.2 oz (89.4 kg)  05/15/19 197 lb (89.4 kg)  04/12/19 197 lb (89.4 kg)      ECOG FS:1 - Symptomatic but completely ambulatory  Sclerae unicteric, EOMs intact Wearing a mask No cervical or supraclavicular adenopathy Lungs no rales or rhonchi Heart regular rate and rhythm Abd soft, nontender, positive bowel sounds MSK no focal spinal tenderness, left upper extremity is wrapped following recent fall and fracture Neuro: nonfocal, well oriented, appropriate affect Breasts: The right breast is benign. The left breast is status post lumpectomy and radiation. There is no evidence of local recurrence. Both axillae are benign.   LAB RESULTS:  CMP     Component Value Date/Time   NA 138 06/12/2019 1023   NA 139 05/15/2019 0930   K 3.9 06/12/2019 1023   CL 103 06/12/2019 1023   CO2 27 06/12/2019 1023   GLUCOSE 101 (H) 06/12/2019 1023   BUN 10 06/12/2019 1023   BUN 12 05/15/2019 0930   CREATININE 0.84 06/12/2019 1023   CREATININE 0.78 12/16/2017 1540   CALCIUM 9.1 06/12/2019 1023   PROT 7.2 06/12/2019 1023   ALBUMIN 4.1 06/12/2019 1023   AST 17 06/12/2019 1023   AST 12 (L) 12/16/2017 1540   ALT 19 06/12/2019 1023   ALT 16 12/16/2017 1540   ALKPHOS 84 06/12/2019 1023   BILITOT 0.5 06/12/2019 1023     BILITOT 0.4 12/16/2017 1540   GFRNONAA >60 06/12/2019 1023   GFRNONAA >60 12/16/2017 1540   GFRAA >60 06/12/2019 1023   GFRAA >60 12/16/2017 1540    No results found for: TOTALPROTELP, ALBUMINELP, A1GS, A2GS, BETS, BETA2SER, GAMS, MSPIKE, SPEI  No results found for: KPAFRELGTCHN, LAMBDASER, KAPLAMBRATIO  Lab Results  Component Value Date   WBC 5.6 06/12/2019   NEUTROABS 3.6 06/12/2019   HGB 12.6 06/12/2019   HCT 39.0 06/12/2019   MCV 93.3 06/12/2019   PLT 309 06/12/2019   No results found for: LABCA2  No components found for: [id number]  No results for input(s): INR in the last 168 hours.  No results found for: LABCA2  No results found for: XQJ194  No results found for: RDE081  No results found for: KGY185  No results found for: CA2729  No components found for: HGQUANT  No results found for: CEA1 / No results found for: CEA1   No results found for: AFPTUMOR  No results found for: CHROMOGRNA  No results found for: HGBA, HGBA2QUANT, HGBFQUANT, HGBSQUAN (Hemoglobinopathy evaluation)   No results found for: LDH  No results found for: IRON, TIBC, IRONPCTSAT (Iron and TIBC)  No results found for: FERRITIN  Urinalysis    Component Value Date/Time   COLORURINE YELLOW 10/02/2015 2022   APPEARANCEUR CLOUDY (A) 10/02/2015 2022   LABSPEC 1.020 10/02/2015 2022   PHURINE 6.5 10/02/2015 2022   GLUCOSEU NEGATIVE 10/02/2015 2022   HGBUR NEGATIVE 10/02/2015 2022   [place] NEGATIVE 10/02/2015 2022   KETONESUR 15 (A) 10/02/2015 2022   PROTEINUR NEGATIVE 10/02/2015 2022   UROBILINOGEN 0.2 09/20/2014 1536   NITRITE NEGATIVE 10/02/2015 2022   LEUKOCYTESUR SMALL (A) 10/02/2015 2022     STUDIES: DG Elbow 2 Views Left  Result Date: 05/25/2019 CLINICAL DATA:  Post fall today with diffuse elbow pain. EXAM: LEFT ELBOW - 2 VIEW COMPARISON:  Forearm radiograph earlier this day demonstrating probable radial neck fracture FINDINGS: Minimally  displaced radial neck  fracture is confirmed. No definite associated elbow joint effusion. No additional fracture of the elbow. IMPRESSION: Minimally displaced radial neck fracture. Electronically Signed   By: Keith Rake M.D.   On: 05/25/2019 14:46   DG Forearm Left  Result Date: 05/25/2019 CLINICAL DATA:  Fall today, pain. EXAM: LEFT FOREARM - 2 VIEW COMPARISON:  None. FINDINGS: Subtle cortical deformity within the proximal LEFT radius, radial neck region, suspicious for nondisplaced or minimally displaced fracture. Remainder of the radius appears intact and normally aligned. Ulna appears intact and normally aligned. Soft tissues about the LEFT forearm are unremarkable. IMPRESSION: Probable nondisplaced or minimally displaced fracture within the proximal LEFT radius, at the level of the radial neck. Recommend dedicated plain film of the LEFT elbow for more definitive characterization. Electronically Signed   By: Franki Cabot M.D.   On: 05/25/2019 14:23   ECHOCARDIOGRAM COMPLETE  Result Date: 06/05/2019    ECHOCARDIOGRAM REPORT   Patient Name:   CHEYENNE BORDEAUX [facility] Date of Exam: 06/05/2019 Medical Rec #:  [id number]       Height:       65.0 in Accession #:    [id number]      Weight:       197.0 lb Date of Birth:  1960/09/07       BSA:          1.965 m Patient Age:    48 years        BP:           122/80 mmHg Patient Gender: F               HR:           70 bpm. Exam Location:  [address] Procedure: 2D Echo, Cardiac Doppler and Color Doppler Indications:    R07.9  History:        Patient has no prior history of Echocardiogram examinations.                 CAD, COPD, Signs/Symptoms:Chest Pain; Risk Factors:Former Smoker                 and Hypertension.  Sonographer:    Coralyn Helling RDCS Referring Phys: [place]  1. Left ventricular ejection fraction, by estimation, is 60 to 65%. The left ventricle has normal function. The left ventricle has no regional wall motion abnormalities. There is mild  asymmetric left ventricular hypertrophy. Left ventricular diastolic parameters were normal.  2. Right ventricular systolic function is normal. The right ventricular size is normal. There is mildly elevated pulmonary artery systolic pressure.  3. The mitral valve is normal in structure. No evidence of mitral valve regurgitation. No evidence of mitral stenosis.  4. The aortic valve is normal in structure. Aortic valve regurgitation is not visualized. No aortic stenosis is present. FINDINGS  Left Ventricle: Left ventricular ejection fraction, by estimation, is 60 to 65%. The left ventricle has normal function. The left ventricle has no regional wall motion abnormalities. The left ventricular internal cavity size was normal in size. There is  mild asymmetric left ventricular hypertrophy. Left ventricular diastolic parameters were normal. Right Ventricle: The right ventricular size is normal. No increase in right ventricular wall thickness. Right ventricular systolic function is normal. There is mildly elevated pulmonary artery systolic pressure. The tricuspid regurgitant velocity is 2.31  m/s, and with an assumed right atrial pressure of 10 mmHg, the estimated right ventricular systolic pressure is 07.3 mmHg. Left  Atrium: Left atrial size was normal in size. Right Atrium: Right atrial size was normal in size. Pericardium: There is no evidence of pericardial effusion. Mitral Valve: The mitral valve is normal in structure. No evidence of mitral valve regurgitation. No evidence of mitral valve stenosis. Tricuspid Valve: The tricuspid valve is normal in structure. Tricuspid valve regurgitation is trivial. Aortic Valve: The aortic valve is normal in structure. Aortic valve regurgitation is not visualized. No aortic stenosis is present. Pulmonic Valve: The pulmonic valve was normal in structure. Pulmonic valve regurgitation is not visualized. No evidence of pulmonic stenosis. Aorta: The aortic root and ascending aorta are  structurally normal, with no evidence of dilitation. IAS/Shunts: The atrial septum is grossly normal.  LEFT VENTRICLE PLAX 2D LVIDd:         3.80 cm  Diastology LVIDs:         2.40 cm  LV e' lateral:   9.25 cm/s LV PW:         1.10 cm  LV E/e' lateral: 8.8 LV IVS:        1.40 cm  LV e' medial:    8.49 cm/s LVOT diam:     2.00 cm  LV E/e' medial:  9.6 LV SV:         87 LV SV Index:   44 LVOT Area:     3.14 cm  RIGHT VENTRICLE            IVC TAPSE (M-mode): 2.0 cm     IVC diam: 1.40 cm RVSP:           24.3 mmHg LEFT ATRIUM             Index       RIGHT ATRIUM           Index LA diam:        3.10 cm 1.58 cm/m  RA Pressure: 3.00 mmHg LA Vol (A2C):   51.8 ml 26.36 ml/m RA Area:     8.25 cm LA Vol (A4C):   48.3 ml 24.57 ml/m RA Volume:   16.70 ml  8.50 ml/m LA Biplane Vol: 52.0 ml 26.46 ml/m  AORTIC VALVE LVOT Vmax:   123.00 cm/s LVOT Vmean:  83.300 cm/s LVOT VTI:    0.277 m  AORTA Ao Root diam: 3.30 cm Ao Asc diam:  3.40 cm MV E velocity: 81.20 cm/s  TRICUSPID VALVE MV A velocity: 85.20 cm/s  TR Peak grad:   21.3 mmHg MV E/A ratio:  0.95        TR Vmax:        231.00 cm/s                            Estimated RAP:  3.00 mmHg                            RVSP:           24.3 mmHg                             SHUNTS                            Systemic VTI:  0.28 m  Systemic Diam: 2.00 cm Mertie Moores MD Electronically signed by Mertie Moores MD Signature Date/Time: 06/05/2019/4:43:41 PM    Final    MM CLIP PLACEMENT RIGHT  Result Date: 05/15/2019 CLINICAL DATA:  Post biopsy mammogram of right breast for clip placement. EXAM: DIAGNOSTIC RIGHT MAMMOGRAM POST ULTRASOUND BIOPSY COMPARISON:  Previous exam(s). FINDINGS: Mammographic images were obtained following ultrasound guided biopsy of a mass in the right breast at 10 o'clock. The biopsy marking clip is in expected position at the site of biopsy. IMPRESSION: Appropriate positioning of the ribbon shaped biopsy marking clip at the site of  biopsy in the upper-outer right breast. Final Assessment: Post Procedure Mammograms for Marker Placement Electronically Signed   By: Ammie Ferrier M.D.   On: 05/15/2019 15:51   XR Elbow 2 Views Left  Result Date: 06/01/2019 2 views left elbow show a nondisplaced radial neck fracture.  The elbow is otherwise well located.  XR Wrist 2 Views Left  Result Date: 06/01/2019 2 views of the left wrist show no fracture or dislocation.  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 This was found to be concordant by Dr. Ammie Ferrier, with excision recommended. Pathology results were discussed with the patient by telephone. The patient reported doing well after the biopsy with tenderness at the site. Post biopsy instructions and care were reviewed and questions were answered. The patient was encouraged to call [facility] for any additional concerns. Surgical consultation has been arranged with Dr. Erroll Luna at [facility] on May 17, 2019. Pathology results reported by Stacie Acres RN on 05/18/2019. Electronically Signed   By: Ammie Ferrier M.D.   On: 05/18/2019 10:46   Result Date: 05/18/2019 CLINICAL DATA:  59 year old female presenting for ultrasound-guided biopsy of a right breast mass. EXAM: ULTRASOUND GUIDED RIGHT BREAST CORE NEEDLE BIOPSY COMPARISON:  Previous exam(s). PROCEDURE: I met with the patient and we discussed the procedure of ultrasound-guided biopsy, including benefits and alternatives. We discussed the high likelihood of a successful procedure. We discussed the risks of the procedure, including infection, bleeding, tissue injury, clip migration, and inadequate sampling. Informed written consent was given. The usual time-out  protocol was performed immediately prior to the procedure. Lesion quadrant: Upper-outer quadrant Using sterile technique and 1% Lidocaine as local anesthetic, under direct ultrasound visualization, a 14 gauge spring-loaded device was used to perform biopsy of a mass in the right breast at 10 o'clock using an inferior approach. At the conclusion of the procedure ribbon shaped tissue marker clip was deployed into the biopsy cavity. Follow up 2 view mammogram was performed and dictated separately. IMPRESSION: Ultrasound guided biopsy of a right breast mass at 10 o'clock. No apparent complications. Electronically Signed: By: Ammie Ferrier M.D. On: 05/15/2019 15:38     ELIGIBLE FOR AVAILABLE RESEARCH PROTOCOL: No   ASSESSMENT: 59 y.o. [name], [place] woman status post left breast upper outer quadrant lumpectomy 11/25/2017 for a pT1b pNX, stage Ia invasive ductal carcinoma, grade 1, estrogen and progesterone receptor positive, HER-2/neu negative, with an MIB-1 of 3%  (1) status post left axillary lymph node sampling 12/09/2017, all 4 sentinel lymph nodes clear  (2) Oncotype score of 24 predicts a 10 % risk of recurrence outside the breast over the next 9 years if  the patient's only systemic therapy is an estrogens for 5 years.  It also predicts no benefit from chemotherapy  (3) adjuvant radiation 01/27/2018 - 03/15/2018  (a) left breast / 50.4 Gy in 28 fractions  (b) boost / 10 Gy in 5 fractions  (4) started tamoxifen 04/16/2018  (a) changed to 10 mg daily May 2020 per patient  (b) discontinued per patient December 2020 secondary to side effects  (5) anastrozole started June 12, 2019  (6) surgery for papilloma June 29, 2019   PLAN: Bethany [name] was not able to tolerate the tamoxifen. She may be able to tolerate the aromatase inhibitors better and we are going to try anastrozole. We discussed the possible toxicities side effects and complications and she tells me she already has an appointment  with [facility] in April and is due for a repeat bone density at that time. She will make sure that I get a copy of that report.  Given her papilloma she wonders if she should have intensified screening with MRI every year alternating with her mammography. We discussed the false positive issues and we will discuss this further when she returns to see me in about 3 months.  She is interested in weight loss and I have given her information on our weight loss management program at [facility].  She has been feeling more depressed and wonders if we should increase the venlafaxine. She has tolerated it well so far so we have gone ahead and opted from 75 250 mg daily.  I do not have a simple explanation for her falls. Again we can consider the rehab program for balance.  She is going to see me again in about 3 months. If she is tolerating the anastrozole well and has significant osteopenia we can consider a pharmacologic agent such as Fosamax or Boniva or denosumab/Prolia to improve her bone density  She knows to call for any other issue that may develop before the next visit.  Total encounter time 30 minutes.*  Aarohi Redditt, Virgie Dad, MD  06/12/19 11:16 AM Medical Oncology and Hematology [facility] [place], [place] [address] Tel. [phone]    Fax. [phone]   I, Wilburn Mylar, am acting as scribe for Dr. Virgie Dad. Coye Dawood.  I, Lurline Del MD, have reviewed the above documentation for accuracy and completeness, and I agree with the above.   *Total Encounter Time as defined by the Centers for Medicare and Medicaid Services includes, in addition to the face-to-face time of a patient visit (documented in the note above) non-face-to-face time: obtaining and reviewing outside history, ordering and reviewing medications, tests or procedures, care coordination (communications with other health care professionals or caregivers) and documentation in the medical  record.

## 2019-06-11 NOTE — Progress Notes (Signed)
   Covid-19 Vaccination Clinic  Name:  Bethany [name]    MRN: [id number] DOB: [birth year]  06/11/2019  Bethany [name] was observed post Covid-19 immunization for 15 minutes without incident. She was provided with Vaccine Information Sheet and instruction to access the V-Safe system.   Bethany [name] was instructed to call 911 with any severe reactions post vaccine: Marland Kitchen Difficulty breathing  . Swelling of face and throat  . A fast heartbeat  . A bad rash all over body  . Dizziness and weakness   Immunizations Administered    Name Date Dose VIS Date Route   Pfizer COVID-19 Vaccine 06/11/2019  2:06 PM 0.3 mL 02/24/2019 Intramuscular   Manufacturer: [place]   Lot: [id number]   [place]: [id number]

## 2019-06-12 ENCOUNTER — Inpatient Hospital Stay: Payer: 59 | Attending: Oncology | Admitting: Oncology

## 2019-06-12 ENCOUNTER — Inpatient Hospital Stay: Payer: 59

## 2019-06-12 ENCOUNTER — Other Ambulatory Visit: Payer: Self-pay

## 2019-06-12 ENCOUNTER — Telehealth: Payer: Self-pay | Admitting: Oncology

## 2019-06-12 ENCOUNTER — Other Ambulatory Visit: Payer: 59

## 2019-06-12 VITALS — BP 142/89 | HR 63 | Temp 98.3°F | Resp 17 | Ht 65.0 in | Wt 197.2 lb

## 2019-06-12 DIAGNOSIS — I10 Essential (primary) hypertension: Secondary | ICD-10-CM | POA: Diagnosis not present

## 2019-06-12 DIAGNOSIS — Z85850 Personal history of malignant neoplasm of thyroid: Secondary | ICD-10-CM | POA: Insufficient documentation

## 2019-06-12 DIAGNOSIS — Z79811 Long term (current) use of aromatase inhibitors: Secondary | ICD-10-CM | POA: Insufficient documentation

## 2019-06-12 DIAGNOSIS — R232 Flushing: Secondary | ICD-10-CM | POA: Diagnosis not present

## 2019-06-12 DIAGNOSIS — C50412 Malignant neoplasm of upper-outer quadrant of left female breast: Secondary | ICD-10-CM | POA: Diagnosis not present

## 2019-06-12 DIAGNOSIS — F419 Anxiety disorder, unspecified: Secondary | ICD-10-CM | POA: Insufficient documentation

## 2019-06-12 DIAGNOSIS — I341 Nonrheumatic mitral (valve) prolapse: Secondary | ICD-10-CM | POA: Insufficient documentation

## 2019-06-12 DIAGNOSIS — Z7982 Long term (current) use of aspirin: Secondary | ICD-10-CM | POA: Diagnosis not present

## 2019-06-12 DIAGNOSIS — R55 Syncope and collapse: Secondary | ICD-10-CM | POA: Insufficient documentation

## 2019-06-12 DIAGNOSIS — K219 Gastro-esophageal reflux disease without esophagitis: Secondary | ICD-10-CM | POA: Insufficient documentation

## 2019-06-12 DIAGNOSIS — I2510 Atherosclerotic heart disease of native coronary artery without angina pectoris: Secondary | ICD-10-CM | POA: Diagnosis not present

## 2019-06-12 DIAGNOSIS — J449 Chronic obstructive pulmonary disease, unspecified: Secondary | ICD-10-CM | POA: Diagnosis not present

## 2019-06-12 DIAGNOSIS — E039 Hypothyroidism, unspecified: Secondary | ICD-10-CM | POA: Diagnosis not present

## 2019-06-12 DIAGNOSIS — C50411 Malignant neoplasm of upper-outer quadrant of right female breast: Secondary | ICD-10-CM | POA: Insufficient documentation

## 2019-06-12 DIAGNOSIS — Z79810 Long term (current) use of selective estrogen receptor modulators (SERMs): Secondary | ICD-10-CM | POA: Insufficient documentation

## 2019-06-12 DIAGNOSIS — Z170 Estrogen receptor positive status [ER+]: Secondary | ICD-10-CM | POA: Insufficient documentation

## 2019-06-12 DIAGNOSIS — Z79899 Other long term (current) drug therapy: Secondary | ICD-10-CM | POA: Insufficient documentation

## 2019-06-12 DIAGNOSIS — Z87891 Personal history of nicotine dependence: Secondary | ICD-10-CM | POA: Insufficient documentation

## 2019-06-12 LAB — COMPREHENSIVE METABOLIC PANEL
ALT: 19 U/L (ref 0–44)
AST: 17 U/L (ref 15–41)
Albumin: 4.1 g/dL (ref 3.5–5.0)
Alkaline Phosphatase: 84 U/L (ref 38–126)
Anion gap: 8 (ref 5–15)
BUN: 10 mg/dL (ref 6–20)
CO2: 27 mmol/L (ref 22–32)
Calcium: 9.1 mg/dL (ref 8.9–10.3)
Chloride: 103 mmol/L (ref 98–111)
Creatinine, Ser: 0.84 mg/dL (ref 0.44–1.00)
GFR calc Af Amer: >60 mL/min (ref >60)
GFR calc non Af Amer: >60 mL/min (ref >60)
Glucose, Bld: 101 mg/dL — ABNORMAL HIGH (ref 70–99)
Potassium: 3.9 mmol/L (ref 3.5–5.1)
Sodium: 138 mmol/L (ref 135–145)
Total Bilirubin: 0.5 mg/dL (ref 0.3–1.2)
Total Protein: 7.2 g/dL (ref 6.5–8.1)

## 2019-06-12 LAB — CBC WITH DIFFERENTIAL/PLATELET
Abs Immature Granulocytes: 0.02 10*3/uL (ref 0.00–0.07)
Basophils Absolute: 0 10*3/uL (ref 0.0–0.1)
Basophils Relative: 1 %
Eosinophils Absolute: 0.1 10*3/uL (ref 0.0–0.5)
Eosinophils Relative: 3 %
HCT: 39 % (ref 36.0–46.0)
Hemoglobin: 12.6 g/dL (ref 12.0–15.0)
Immature Granulocytes: 0 %
Lymphocytes Relative: 26 %
Lymphs Abs: 1.5 10*3/uL (ref 0.7–4.0)
MCH: 30.1 pg (ref 26.0–34.0)
MCHC: 32.3 g/dL (ref 30.0–36.0)
MCV: 93.3 fL (ref 80.0–100.0)
Monocytes Absolute: 0.4 10*3/uL (ref 0.1–1.0)
Monocytes Relative: 6 %
Neutro Abs: 3.6 10*3/uL (ref 1.7–7.7)
Neutrophils Relative %: 64 %
Platelets: 309 10*3/uL (ref 150–400)
RBC: 4.18 MIL/uL (ref 3.87–5.11)
RDW: 12.8 % (ref 11.5–15.5)
WBC: 5.6 10*3/uL (ref 4.0–10.5)
nRBC: 0 % (ref 0.0–0.2)

## 2019-06-12 MED ORDER — ANASTROZOLE 1 MG PO TABS: 1.0000 mg | ORAL_TABLET | Freq: Every day | ORAL | 4 refills | Status: DC

## 2019-06-12 MED ORDER — VENLAFAXINE HCL ER 150 MG PO CP24: 150.0000 mg | ORAL_CAPSULE | Freq: Every day | ORAL | 6 refills | Status: DC

## 2019-06-12 NOTE — Telephone Encounter (Signed)
Scheduled appt per 3/29 los. Called pt to inform. Pt's voicemail box was full. Mailed appt reminder and calendar.

## 2019-06-13 LAB — LUTEINIZING HORMONE: LH: 59 m[IU]/mL

## 2019-06-13 LAB — FOLLICLE STIMULATING HORMONE: FSH: 83.8 m[IU]/mL

## 2019-06-14 ENCOUNTER — Other Ambulatory Visit: Payer: Self-pay | Admitting: Oncology

## 2019-06-19 ENCOUNTER — Telehealth (HOSPITAL_COMMUNITY): Payer: Self-pay | Admitting: Emergency Medicine

## 2019-06-19 ENCOUNTER — Encounter (HOSPITAL_COMMUNITY): Payer: Self-pay

## 2019-06-19 LAB — ESTRADIOL, ULTRA SENS: Estradiol, Sensitive: 6.9 pg/mL

## 2019-06-19 NOTE — Telephone Encounter (Signed)
VM box full. Unable to leave new message

## 2019-06-20 ENCOUNTER — Ambulatory Visit (HOSPITAL_COMMUNITY)
Admission: RE | Admit: 2019-06-20 | Discharge: 2019-06-20 | Disposition: A | Discharge status: Home / Self Care | Payer: 59 | Source: Ambulatory Visit | Attending: Cardiovascular Disease | Admitting: Cardiovascular Disease

## 2019-06-20 ENCOUNTER — Other Ambulatory Visit: Payer: Self-pay

## 2019-06-20 DIAGNOSIS — I2510 Atherosclerotic heart disease of native coronary artery without angina pectoris: Secondary | ICD-10-CM | POA: Diagnosis not present

## 2019-06-20 DIAGNOSIS — R079 Chest pain, unspecified: Secondary | ICD-10-CM | POA: Diagnosis present

## 2019-06-20 IMAGING — CT CT HEART MORP W/ CTA COR W/ SCORE W/ CA W/CM &/OR W/O CM
4 of 7 series · 8 of 20 positions shown, 9 images · non-contrast
Comparison: [DATE]

Addendum:
CLINICAL DATA: Chest pain

EXAM:
Cardiac CTA
MEDICATIONS:
Sub lingual nitro.  4mg
TECHNIQUE: The patient was scanned on a Siemens Force [REDACTED]ice scanner. Gantry
rotation speed was 250 msecs. Collimation was .6 mm. A 100 kV
prospective scan was triggered in the ascending thoracic aorta at
140 HU's Full mA was used between 35% and 75% of the R-R interval.
Average HR during the scan was 51 bpm. The 3D data set was
interpreted on a dedicated work station using MPR, MIP and VRT
modes. A total of 80cc of contrast was used.

[Series 6: best diast 74 % · axial · 0.39mm/px · z∈[-108,-61]mm · 2 of 356 slices shown, 3 images]
[im 119/356  vessel]
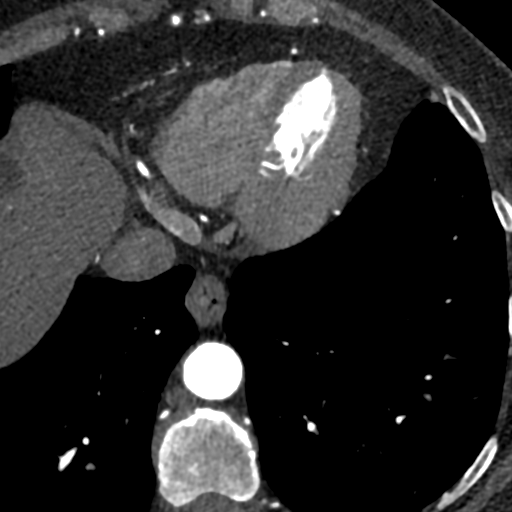
[im 119/356  lung]
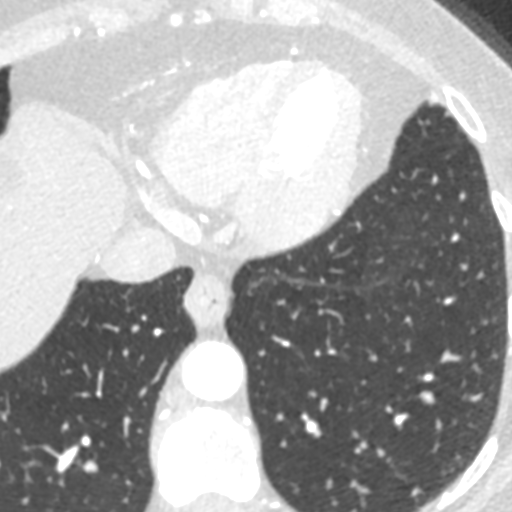
[im 237/356  vessel]
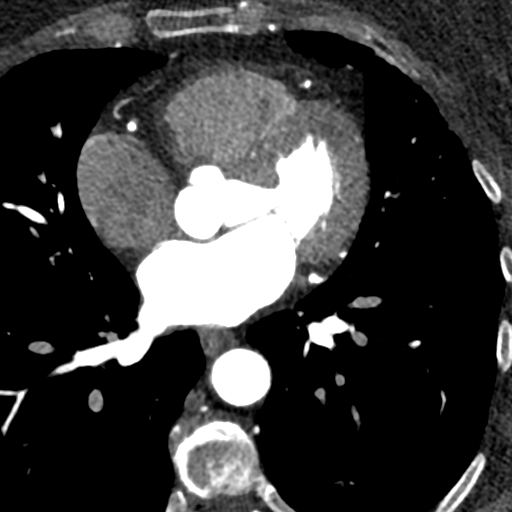

[Series 7: best syst 33 % · axial · 0.39mm/px · z∈[-108,-61]mm · 2 of 356 slices shown]
[im 119/356  vessel]
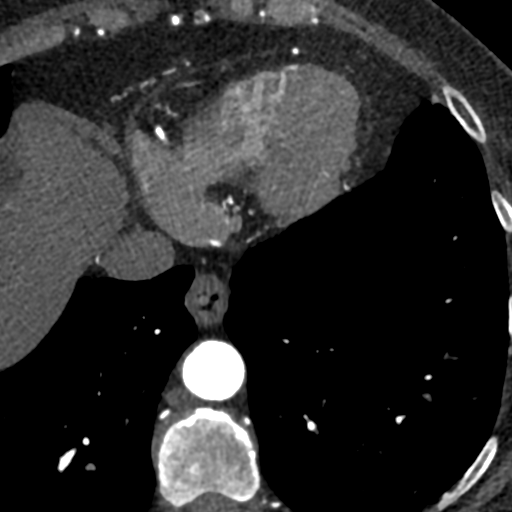
[im 237/356  vessel]
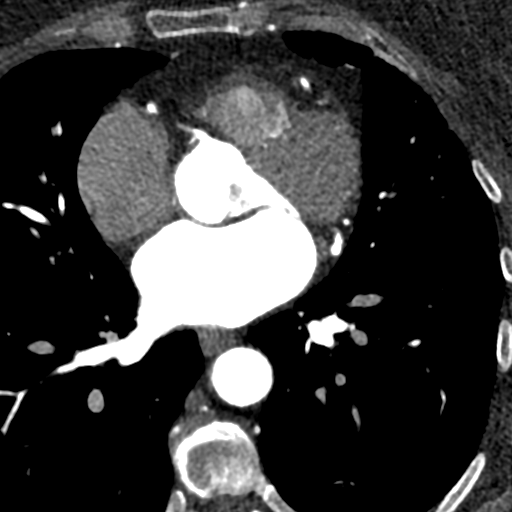

[Series 8: ts diast sharp 33 % · axial · 0.39mm/px · z∈[-108,-61]mm · 2 of 356 slices shown]
[im 119/356  lung]
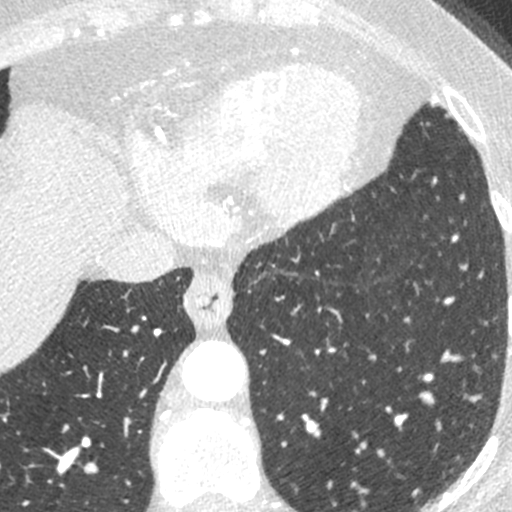
[im 237/356  lung]
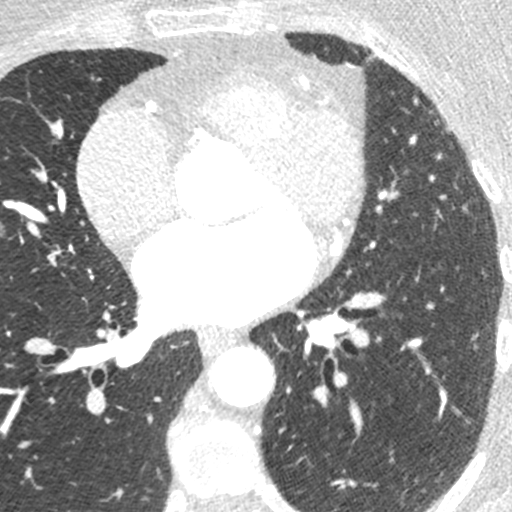

[Series 9: ts syst sharp 33 % · axial · 0.39mm/px · z∈[-108,-61]mm · 2 of 356 slices shown]
[im 119/356  lung]
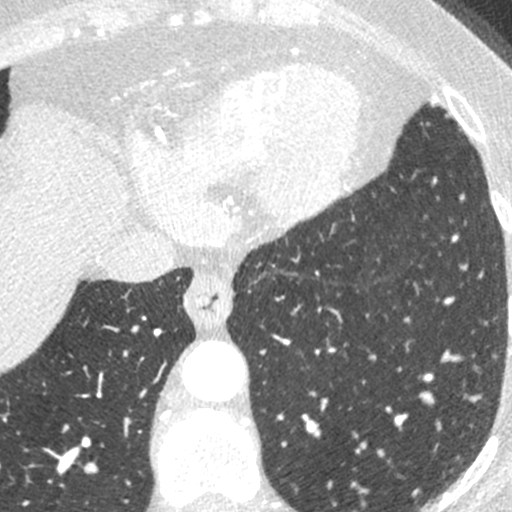
[im 237/356  lung]
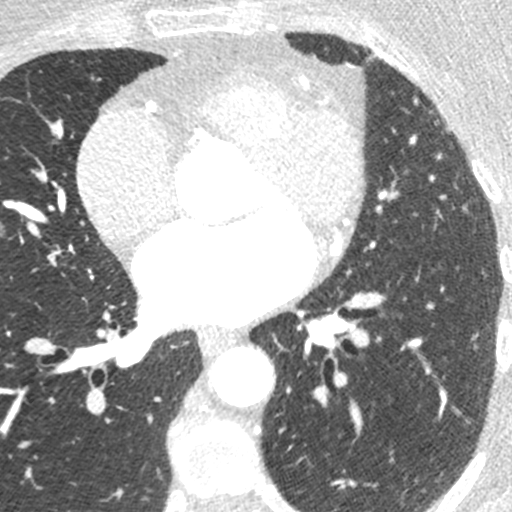

[8 of 20 positions shown; findings below may reference images not displayed]

FINDINGS: Non-cardiac: See separate report from [REDACTED]. No
significant findings on limited lung and soft tissue windows.

Calcium Score: Calcium noted in proximal and mid LAD and RCA

Coronary Arteries: Right dominant with no anomalies

LM: Normal

LAD: 25-49% calcified plaque in proximal vessel > 70% mixed plaque
in mid LAD

D1: Small vessel

D2: Small vessel

Circumflex: Normal

OM1: Normal

OM2: Normal

RCA: 1-24% calcified plaque in proximal vessel distal vessel is
tortuous

PDA: Normal

PLA: Normal
IMPRESSION: 1.  Calcium score 263 which is 98 th percentile for age/sex

2.   Normal aortic root 3.6 cm

3. CAD-RADS 4 likely > 70% hemodynamically significant stenosis in
mid LAD Study sent for FFR CT

JERILYN

EXAM:
OVER-READ INTERPRETATION  CT CHEST

The following report is an over-read performed by radiologist Dr.
JERILYN [REDACTED] on [DATE]. This over-read
does not include interpretation of cardiac or coronary anatomy or
pathology. The coronary CTA interpretation by the cardiologist is
attached.
FINDINGS: Vascular: Aortic atherosclerosis. No central pulmonary embolism, on
this non-dedicated study.

Mediastinum/Nodes: No imaged thoracic adenopathy.

Lungs/Pleura: No pleural fluid. Minimal lingular subpleural
radiation fibrosis again identified.

Upper Abdomen: Hepatic dome 1.9 cm cyst. Normal imaged portions of
the spleen, stomach.

Musculoskeletal: Radiation induced skin thickening involving the
left breast. Midthoracic spondylosis.
IMPRESSION: No acute findings in the imaged extracardiac chest.

Aortic Atherosclerosis ([BZ]-[BZ]).

*** End of Addendum ***
FINDINGS: Non-cardiac: See separate report from [REDACTED]. No
significant findings on limited lung and soft tissue windows.

Calcium Score: Calcium noted in proximal and mid LAD and RCA

Coronary Arteries: Right dominant with no anomalies

LM: Normal

LAD: 25-49% calcified plaque in proximal vessel > 70% mixed plaque
in mid LAD

D1: Small vessel

D2: Small vessel

Circumflex: Normal

OM1: Normal

OM2: Normal

RCA: 1-24% calcified plaque in proximal vessel distal vessel is
tortuous

PDA: Normal

PLA: Normal
IMPRESSION: 1.  Calcium score 263 which is 98 th percentile for age/sex

2.   Normal aortic root 3.6 cm

3. CAD-RADS 4 likely > 70% hemodynamically significant stenosis in
mid LAD Study sent for FFR CT

JERILYN

## 2019-06-20 MED ORDER — NITROGLYCERIN 0.4 MG SL SUBL
SUBLINGUAL_TABLET | SUBLINGUAL | Status: AC
  Administered 2019-06-20: 0.8 mg via SUBLINGUAL
  Filled 2019-06-20: qty 2

## 2019-06-20 MED ORDER — IOHEXOL 350 MG/ML SOLN
100.0000 mL | Freq: Once | INTRAVENOUS | Status: AC | PRN
  Administered 2019-06-20: 18:00:00 100 mL via INTRAVENOUS

## 2019-06-20 MED ORDER — NITROGLYCERIN 0.4 MG SL SUBL: 0.8000 mg | SUBLINGUAL_TABLET | Freq: Once | SUBLINGUAL | Status: AC

## 2019-06-21 ENCOUNTER — Telehealth: Payer: Self-pay

## 2019-06-21 ENCOUNTER — Ambulatory Visit (HOSPITAL_COMMUNITY)
Admission: RE | Admit: 2019-06-21 | Discharge: 2019-06-21 | Disposition: A | Discharge status: Home / Self Care | Payer: 59 | Source: Ambulatory Visit | Attending: Cardiovascular Disease | Admitting: Cardiovascular Disease

## 2019-06-21 DIAGNOSIS — R079 Chest pain, unspecified: Secondary | ICD-10-CM | POA: Diagnosis present

## 2019-06-21 DIAGNOSIS — I2510 Atherosclerotic heart disease of native coronary artery without angina pectoris: Secondary | ICD-10-CM | POA: Diagnosis not present

## 2019-06-21 NOTE — Telephone Encounter (Signed)
-----   Message from Burnell Blanks, MD sent at 06/21/2019 10:51 AM EDT ----- Her coronary CTA is abnormal. Can we see if she could come in to see me tomorrow at 8am or in the open afternoon slot? I will need to discuss a cath. Thanks, chris

## 2019-06-21 NOTE — Telephone Encounter (Signed)
The patient has been notified of the Coronary CT result and verbalized understanding.  All questions (if any) were answered. Frederik Schmidt, RN 06/21/2019 10:58 AM

## 2019-06-22 ENCOUNTER — Other Ambulatory Visit: Payer: Self-pay

## 2019-06-22 ENCOUNTER — Encounter: Payer: Self-pay | Admitting: Cardiovascular Disease

## 2019-06-22 ENCOUNTER — Ambulatory Visit (INDEPENDENT_AMBULATORY_CARE_PROVIDER_SITE_OTHER): Payer: 59 | Admitting: Cardiovascular Disease

## 2019-06-22 VITALS — BP 124/86 | HR 77 | Ht 65.0 in | Wt 200.0 lb

## 2019-06-22 DIAGNOSIS — I25110 Atherosclerotic heart disease of native coronary artery with unstable angina pectoris: Secondary | ICD-10-CM

## 2019-06-22 MED ORDER — ROSUVASTATIN CALCIUM 10 MG PO TABS: 10.0000 mg | ORAL_TABLET | Freq: Every day | ORAL | 3 refills | Status: DC

## 2019-06-22 MED ORDER — NITROGLYCERIN 0.4 MG SL SUBL: 0.4000 mg | SUBLINGUAL_TABLET | SUBLINGUAL | 3 refills | Status: AC | PRN

## 2019-06-22 NOTE — H&P (View-Only) (Signed)
Chief Complaint  Patient presents with  . Follow-up    CAD   History of Present Illness: 59 yo female with history of CAD, breast cancer in 2019 s/p radiation therapy, hiatal hernia, thyroid cancer, hypothyroidism, HTN former tobacco abuse and anxiety here today for cardiac follow up. I saw her as a new patient for evaluation of chest pain 05/15/19.Bethany [name] She was seen in the ED January 2021 with chest pain. Troponin was negative. EKG without ischemic changes. Normal EGD per Dr. Henrene Pastor 04/12/19 with no cause for chest pain isolated. She is known to have radiation induced lung fibrosis. Chest CT August 2020 with evidence of coronary calcification. I arranged a gated cardiac CTA on 06/20/19 and this showed a LAD stenosis that appears to be flow limiting. Echo 06/05/19 showed normal LV systolic function and no valve disease She has been diagnosed with breast cancer in 2019 and has been undergoing radiation therapy with plans for lumpectomy on 06/29/19.    She is here today for follow up. The patient denies any dyspnea, palpitations, lower extremity edema, orthopnea, PND, dizziness, near syncope or syncope. She continue to have episodes of chest pain  With exertion.   Primary Care Physician: Leeroy Cha, MD   Past Medical History:  Diagnosis Date  . Anxiety   . Breast cancer ([place])    left breast cancer/diagnosed in 10/2017/taking radiation treatment until 03/15/18  . Breast mass, left 11/2017   going thru radiation until 02/2018  . CAD (coronary artery disease)   . Chest pain 06/19/2014  . COPD (chronic obstructive pulmonary disease) ([place])    beginning stages/small scar  . Dental crowns present   . History of hiatal hernia    no current med.  Bethany [name] History of thyroid cancer 12/15/2017  . Hypertension    states under control with med., has been on med. x 1 yr.  . Hypothyroidism   . Ischemic chest pain ([place])   . Malignant neoplasm of upper-outer quadrant of left breast in female, estrogen receptor  positive ([place])   . 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 MVP (mitral valve prolapse)   . Personal history of radiation therapy   . PONV (postoperative nausea and vomiting)   . Radiation fibrosis of lung ([place])   . Syncope and collapse 12/25/2008   Qualifier: Diagnosis of  By: Philemon Kingdom    . Urinary incontinence    USI   . Vitamin D deficiency     Past Surgical History:  Procedure Laterality Date  . ABDOMINAL HYSTERECTOMY     partial  . APPENDECTOMY    . AXILLARY SENTINEL NODE BIOPSY Left 12/09/2017   Procedure: AXILLARY SENTINEL NODE BIOPSY;  Surgeon: Erroll Luna, MD;  Location: [facility];  Service: General;  Laterality: Left;  . BREAST LUMPECTOMY Left 11/2017  . BREAST LUMPECTOMY WITH NEEDLE LOCALIZATION Left 11/25/2017   Procedure: LEFT BREAST LUMPECTOMY WITH NEEDLE LOCALIZATION;  Surgeon: Erroll Luna, MD;  Location: [facility];  Service: General;  Laterality: Left;  . COLONOSCOPY WITH PROPOFOL  10/03/2015  . LAPAROSCOPIC APPENDECTOMY N/A 10/03/2015   Procedure: APPENDECTOMY LAPAROSCOPIC;  Surgeon: Rolm Bookbinder, MD;  Location: [facility];  Service: General;  Laterality: N/A;  . TOTAL THYROIDECTOMY  04/02/2003    Current Outpatient Medications  Medication Sig Dispense Refill  . albuterol (VENTOLIN HFA) 108 (90 Base) MCG/ACT inhaler Inhale 2 puffs into the lungs every 6 (six) hours as needed for wheezing or shortness of breath. 18 g 6  . anastrozole (  ARIMIDEX) 1 MG tablet Take 1 tablet (1 mg total) by mouth daily. 90 tablet 4  . aspirin EC 81 MG tablet Take 81 mg by mouth daily.    . ergocalciferol (VITAMIN D2) 50000 UNITS capsule Take 50,000 Units by mouth once a week. On Thurs & Sun    . ibuprofen (ADVIL) 200 MG tablet Take 600 mg by mouth every 6 (six) hours as needed for fever, headache or moderate pain.    Bethany [name] levothyroxine (SYNTHROID, LEVOTHROID) 137 MCG tablet Take 137 mcg by mouth at bedtime.     Bethany [name] losartan-hydrochlorothiazide  (HYZAAR) 50-12.5 MG tablet Take 1 tablet by mouth daily.    . Multiple Vitamin (MULTIVITAMIN WITH MINERALS) TABS tablet Take 1 tablet by mouth daily.    Bethany [name] omeprazole (PRILOSEC) 20 MG capsule Take 20 mg by mouth daily as needed (acid reflux).    . ondansetron (ZOFRAN) 4 MG tablet Take 1 tablet (4 mg total) by mouth every 6 (six) hours as needed for nausea or vomiting. 12 tablet 0  . venlafaxine XR (EFFEXOR XR) 150 MG 24 hr capsule Take 1 capsule (150 mg total) by mouth daily with breakfast. 60 capsule 6  . nitroGLYCERIN (NITROSTAT) 0.4 MG SL tablet Place 1 tablet (0.4 mg total) under the tongue every 5 (five) minutes as needed for chest pain. 25 tablet 3  . rosuvastatin (CRESTOR) 10 MG tablet Take 1 tablet (10 mg total) by mouth daily. 90 tablet 3   Current Facility-Administered Medications  Medication Dose Route Frequency Provider Last Rate Last Admin  . 0.9 %  sodium chloride infusion  500 mL Intravenous Once Irene Shipper, MD        Allergies  Allergen Reactions  . Oxycodone Nausea And Vomiting  . Latex Rash  . Sulfa Antibiotics     hives    Social History   Socioeconomic History  . Marital status: Married    Spouse name: Not on file  . Number of children: 3  . Years of education: Not on file  . Highest education level: Not on file  Occupational History  . Occupation: Self employed  Tobacco Use  . Smoking status: Former Smoker    Quit date: 08/14/2014    Years since quitting: 4.8  . Smokeless tobacco: Never Used  Substance and Sexual Activity  . Alcohol use: Yes    Comment: rarely  . Drug use: No  . Sexual activity: Yes    Partners: Male  Other Topics Concern  . Not on file  Social History Narrative  . Not on file   Social Determinants of Health   Financial Resource Strain:   . Difficulty of Paying Living Expenses:   Food Insecurity:   . Worried About Charity fundraiser in the Last Year:   . Arboriculturist in the Last Year:   Transportation Needs:   . Lexicographer (Medical):   Bethany [name] Lack of Transportation (Non-Medical):   Physical Activity:   . Days of Exercise per Week:   . Minutes of Exercise per Session:   Stress:   . Feeling of Stress :   Social Connections:   . Frequency of Communication with Friends and Family:   . Frequency of Social Gatherings with Friends and Family:   . Attends Religious Services:   . Active Member of Clubs or Organizations:   . Attends Archivist Meetings:   Bethany [name] Marital Status:   Intimate Partner Violence:   . Fear of Current  or Ex-Partner:   . Emotionally Abused:   Bethany [name] Physically Abused:   . Sexually Abused:     Family History  Problem Relation Age of Onset  . Hypertension Father   . Heart disease Father   . Non-Hodgkin's lymphoma Father   . Hypertension Brother   . Heart disease Paternal Grandfather   . Colon cancer Other        great maternal aunt   . Colon polyps Mother   . Stomach cancer Neg Hx   . Rectal cancer Neg Hx   . Esophageal cancer Neg Hx     Review of Systems:  As stated in the HPI and otherwise negative.   BP 124/86   Pulse 77   Ht 5\' 5"  (1.651 m)   Wt 200 lb (90.7 kg)   SpO2 95%   BMI 33.28 kg/m   Physical Examination: General: Well developed, well nourished, NAD  HEENT: OP clear, mucus membranes moist  SKIN: warm, dry. No rashes. Neuro: No focal deficits  Musculoskeletal: Muscle strength 5/5 all ext  Psychiatric: Mood and affect normal  Neck: No JVD, no carotid bruits, no thyromegaly, no lymphadenopathy.  Lungs:Clear bilaterally, no wheezes, rhonci, crackles Cardiovascular: Regular rate and rhythm. No murmurs, gallops or rubs. Abdomen:Soft. Bowel sounds present. Non-tender.  Extremities: No lower extremity edema. Pulses are 2 + in the bilateral DP/PT.  EKG:  EKG is ordered today. The ekg ordered today demonstrates NSR, rate 77 bpm.   Echo 06/05/19: 1. Left ventricular ejection fraction, by estimation, is 60 to 65%. The  left ventricle has normal  function. The left ventricle has no regional  wall motion abnormalities. There is mild asymmetric left ventricular  hypertrophy. Left ventricular diastolic  parameters were normal.  2. Right ventricular systolic function is normal. The right ventricular  size is normal. There is mildly elevated pulmonary artery systolic  pressure.  3. The mitral valve is normal in structure. No evidence of mitral valve  regurgitation. No evidence of mitral stenosis.  4. The aortic valve is normal in structure. Aortic valve regurgitation is  not visualized. No aortic stenosis is present.   Cardiac CTA 06/20/19: FINDINGS: Non-cardiac: See separate report from [facility]. No significant findings on limited lung and soft tissue windows.  Calcium Score: Calcium noted in proximal and mid LAD and RCA  Coronary Arteries: Right dominant with no anomalies  LM: Normal  LAD: 25-49% calcified plaque in proximal vessel > 70% mixed plaque in mid LAD  D1: Small vessel  D2: Small vessel  Circumflex: Normal  OM1: Normal  OM2: Normal  RCA: 1-24% calcified plaque in proximal vessel distal vessel is tortuous  PDA: Normal  PLA: Normal  IMPRESSION: 1.  Calcium score 263 which is 98 th percentile for age/sex  2.   Normal aortic root 3.6 cm  3. CAD-RADS 4 likely > 70% hemodynamically significant stenosis in mid LAD Study sent for FFR CT  FINDINGS: FFR CT positive in mid LAD 0.79 and 0.76 in distal LAD  IMPRESSION: FFR CT suggesting hemodynamically significant CAD in mid LAD Patient will be referred for heart catheterization  Recent Labs: 02/07/2019: TSH 1.91 06/12/2019: ALT 19; BUN 10; Creatinine, Ser 0.84; Hemoglobin 12.6; Platelets 309; Potassium 3.9; Sodium 138   Lipid Panel    Component Value Date/Time   CHOL 192 06/19/2014 2234   TRIG 90 06/19/2014 2234   HDL 52 06/19/2014 2234   CHOLHDL 3.7 06/19/2014 2234   VLDL 18 06/19/2014 2234   LDLCALC 122 (H)  06/19/2014 2234     Wt Readings from Last 3 Encounters:  06/22/19 200 lb (90.7 kg)  06/12/19 197 lb 3.2 oz (89.4 kg)  05/15/19 197 lb (89.4 kg)     Other studies Reviewed: Additional studies/ records that were reviewed today include:  Review of the above records demonstrates:    Assessment and Plan:   1. CAD with unstable angina: Gated cardiac CTA with evidence of LAD stenosis.  She continues to have chest pain. Cardiac cath is indicated. I will plan cardiac cath at [facility] 06/29/19 at 10:30 am.  I have reviewed the risks, indications, and alternatives to cardiac catheterization, possible angioplasty, and stenting with the patient. Risks include but are not limited to bleeding, infection, vascular injury, stroke, myocardial infection, arrhythmia, kidney injury, radiation-related injury in the case of prolonged fluoroscopy use, emergency cardiac surgery, and death. The patient understands the risks of serious complication is 1-2 in [id number] with diagnostic cardiac cath and 1-2% or less with angioplasty/stenting. - We will give her a prescription for NTG to use as needed today.  -Continue ASA and beta blocker.  -Start Crestor 10 mg daily   Current medicines are reviewed at length with the patient today.  The patient does not have concerns regarding medicines.  The following changes have been made:  no change  Labs/ tests ordered today include:   Orders Placed This Encounter  Procedures  . EKG 12-Lead   Disposition:   FU with me or office APP in 4-6 weeks.    Signed, [name], MD 06/22/2019 4:32 PM    [facility] [place], [place], [place]  [address] Phone: [phone]; Fax: [phone]

## 2019-06-22 NOTE — Progress Notes (Signed)
Chief Complaint  Patient presents with  . Follow-up    CAD   History of Present Illness: 59 yo female with history of CAD, breast cancer in 2019 s/p radiation therapy, hiatal hernia, thyroid cancer, hypothyroidism, HTN former tobacco abuse and anxiety here today for cardiac follow up. I saw her as a new patient for evaluation of chest pain 05/15/19.Marland Kitchen She was seen in the ED January 2021 with chest pain. Troponin was negative. EKG without ischemic changes. Normal EGD per Dr. Henrene Pastor 04/12/19 with no cause for chest pain isolated. She is known to have radiation induced lung fibrosis. Chest CT August 2020 with evidence of coronary calcification. I arranged a gated cardiac CTA on 06/20/19 and this showed a LAD stenosis that appears to be flow limiting. Echo 06/05/19 showed normal LV systolic function and no valve disease She has been diagnosed with breast cancer in 2019 and has been undergoing radiation therapy with plans for lumpectomy on 06/29/19.    She is here today for follow up. The patient denies any dyspnea, palpitations, lower extremity edema, orthopnea, PND, dizziness, near syncope or syncope. She continue to have episodes of chest pain  With exertion.   Primary Care Physician: Leeroy Cha, MD   Past Medical History:  Diagnosis Date  . Anxiety   . Breast cancer ([place])    left breast cancer/diagnosed in 10/2017/taking radiation treatment until 03/15/18  . Breast mass, left 11/2017   going thru radiation until 02/2018  . CAD (coronary artery disease)   . Chest pain 06/19/2014  . COPD (chronic obstructive pulmonary disease) ([place])    beginning stages/small scar  . Dental crowns present   . History of hiatal hernia    no current med.  Marland Kitchen History of thyroid cancer 12/15/2017  . Hypertension    states under control with med., has been on med. x 1 yr.  . Hypothyroidism   . Ischemic chest pain ([place])   . Malignant neoplasm of upper-outer quadrant of left breast in female, estrogen receptor  positive ([place])   . 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 MVP (mitral valve prolapse)   . Personal history of radiation therapy   . PONV (postoperative nausea and vomiting)   . Radiation fibrosis of lung ([place])   . Syncope and collapse 12/25/2008   Qualifier: Diagnosis of  By: Philemon Kingdom    . Urinary incontinence    USI   . Vitamin D deficiency     Past Surgical History:  Procedure Laterality Date  . ABDOMINAL HYSTERECTOMY     partial  . APPENDECTOMY    . AXILLARY SENTINEL NODE BIOPSY Left 12/09/2017   Procedure: AXILLARY SENTINEL NODE BIOPSY;  Surgeon: Erroll Luna, MD;  Location: [facility];  Service: General;  Laterality: Left;  . BREAST LUMPECTOMY Left 11/2017  . BREAST LUMPECTOMY WITH NEEDLE LOCALIZATION Left 11/25/2017   Procedure: LEFT BREAST LUMPECTOMY WITH NEEDLE LOCALIZATION;  Surgeon: Erroll Luna, MD;  Location: [facility];  Service: General;  Laterality: Left;  . COLONOSCOPY WITH PROPOFOL  10/03/2015  . LAPAROSCOPIC APPENDECTOMY N/A 10/03/2015   Procedure: APPENDECTOMY LAPAROSCOPIC;  Surgeon: Rolm Bookbinder, MD;  Location: [facility];  Service: General;  Laterality: N/A;  . TOTAL THYROIDECTOMY  04/02/2003    Current Outpatient Medications  Medication Sig Dispense Refill  . albuterol (VENTOLIN HFA) 108 (90 Base) MCG/ACT inhaler Inhale 2 puffs into the lungs every 6 (six) hours as needed for wheezing or shortness of breath. 18 g 6  . anastrozole (  ARIMIDEX) 1 MG tablet Take 1 tablet (1 mg total) by mouth daily. 90 tablet 4  . aspirin EC 81 MG tablet Take 81 mg by mouth daily.    . ergocalciferol (VITAMIN D2) 50000 UNITS capsule Take 50,000 Units by mouth once a week. On Thurs & Sun    . ibuprofen (ADVIL) 200 MG tablet Take 600 mg by mouth every 6 (six) hours as needed for fever, headache or moderate pain.    Marland Kitchen levothyroxine (SYNTHROID, LEVOTHROID) 137 MCG tablet Take 137 mcg by mouth at bedtime.     Marland Kitchen losartan-hydrochlorothiazide  (HYZAAR) 50-12.5 MG tablet Take 1 tablet by mouth daily.    . Multiple Vitamin (MULTIVITAMIN WITH MINERALS) TABS tablet Take 1 tablet by mouth daily.    Marland Kitchen omeprazole (PRILOSEC) 20 MG capsule Take 20 mg by mouth daily as needed (acid reflux).    . ondansetron (ZOFRAN) 4 MG tablet Take 1 tablet (4 mg total) by mouth every 6 (six) hours as needed for nausea or vomiting. 12 tablet 0  . venlafaxine XR (EFFEXOR XR) 150 MG 24 hr capsule Take 1 capsule (150 mg total) by mouth daily with breakfast. 60 capsule 6  . nitroGLYCERIN (NITROSTAT) 0.4 MG SL tablet Place 1 tablet (0.4 mg total) under the tongue every 5 (five) minutes as needed for chest pain. 25 tablet 3  . rosuvastatin (CRESTOR) 10 MG tablet Take 1 tablet (10 mg total) by mouth daily. 90 tablet 3   Current Facility-Administered Medications  Medication Dose Route Frequency Provider Last Rate Last Admin  . 0.9 %  sodium chloride infusion  500 mL Intravenous Once Irene Shipper, MD        Allergies  Allergen Reactions  . Oxycodone Nausea And Vomiting  . Latex Rash  . Sulfa Antibiotics     hives    Social History   Socioeconomic History  . Marital status: Married    Spouse name: Not on file  . Number of children: 3  . Years of education: Not on file  . Highest education level: Not on file  Occupational History  . Occupation: Self employed  Tobacco Use  . Smoking status: Former Smoker    Quit date: 08/14/2014    Years since quitting: 4.8  . Smokeless tobacco: Never Used  Substance and Sexual Activity  . Alcohol use: Yes    Comment: rarely  . Drug use: No  . Sexual activity: Yes    Partners: Male  Other Topics Concern  . Not on file  Social History Narrative  . Not on file   Social Determinants of Health   Financial Resource Strain:   . Difficulty of Paying Living Expenses:   Food Insecurity:   . Worried About Charity fundraiser in the Last Year:   . Arboriculturist in the Last Year:   Transportation Needs:   . Lexicographer (Medical):   Marland Kitchen Lack of Transportation (Non-Medical):   Physical Activity:   . Days of Exercise per Week:   . Minutes of Exercise per Session:   Stress:   . Feeling of Stress :   Social Connections:   . Frequency of Communication with Friends and Family:   . Frequency of Social Gatherings with Friends and Family:   . Attends Religious Services:   . Active Member of Clubs or Organizations:   . Attends Archivist Meetings:   Marland Kitchen Marital Status:   Intimate Partner Violence:   . Fear of Current  or Ex-Partner:   . Emotionally Abused:   Marland Kitchen Physically Abused:   . Sexually Abused:     Family History  Problem Relation Age of Onset  . Hypertension Father   . Heart disease Father   . Non-Hodgkin's lymphoma Father   . Hypertension Brother   . Heart disease Paternal Grandfather   . Colon cancer Other        great maternal aunt   . Colon polyps Mother   . Stomach cancer Neg Hx   . Rectal cancer Neg Hx   . Esophageal cancer Neg Hx     Review of Systems:  As stated in the HPI and otherwise negative.   BP 124/86   Pulse 77   Ht 5\' 5"  (1.651 m)   Wt 200 lb (90.7 kg)   SpO2 95%   BMI 33.28 kg/m   Physical Examination: General: Well developed, well nourished, NAD  HEENT: OP clear, mucus membranes moist  SKIN: warm, dry. No rashes. Neuro: No focal deficits  Musculoskeletal: Muscle strength 5/5 all ext  Psychiatric: Mood and affect normal  Neck: No JVD, no carotid bruits, no thyromegaly, no lymphadenopathy.  Lungs:Clear bilaterally, no wheezes, rhonci, crackles Cardiovascular: Regular rate and rhythm. No murmurs, gallops or rubs. Abdomen:Soft. Bowel sounds present. Non-tender.  Extremities: No lower extremity edema. Pulses are 2 + in the bilateral DP/PT.  EKG:  EKG is ordered today. The ekg ordered today demonstrates NSR, rate 77 bpm.   Echo 06/05/19: 1. Left ventricular ejection fraction, by estimation, is 60 to 65%. The  left ventricle has normal  function. The left ventricle has no regional  wall motion abnormalities. There is mild asymmetric left ventricular  hypertrophy. Left ventricular diastolic  parameters were normal.  2. Right ventricular systolic function is normal. The right ventricular  size is normal. There is mildly elevated pulmonary artery systolic  pressure.  3. The mitral valve is normal in structure. No evidence of mitral valve  regurgitation. No evidence of mitral stenosis.  4. The aortic valve is normal in structure. Aortic valve regurgitation is  not visualized. No aortic stenosis is present.   Cardiac CTA 06/20/19: FINDINGS: Non-cardiac: See separate report from [facility]. No significant findings on limited lung and soft tissue windows.  Calcium Score: Calcium noted in proximal and mid LAD and RCA  Coronary Arteries: Right dominant with no anomalies  LM: Normal  LAD: 25-49% calcified plaque in proximal vessel > 70% mixed plaque in mid LAD  D1: Small vessel  D2: Small vessel  Circumflex: Normal  OM1: Normal  OM2: Normal  RCA: 1-24% calcified plaque in proximal vessel distal vessel is tortuous  PDA: Normal  PLA: Normal  IMPRESSION: 1.  Calcium score 263 which is 98 th percentile for age/sex  2.   Normal aortic root 3.6 cm  3. CAD-RADS 4 likely > 70% hemodynamically significant stenosis in mid LAD Study sent for FFR CT  FINDINGS: FFR CT positive in mid LAD 0.79 and 0.76 in distal LAD  IMPRESSION: FFR CT suggesting hemodynamically significant CAD in mid LAD Patient will be referred for heart catheterization  Recent Labs: 02/07/2019: TSH 1.91 06/12/2019: ALT 19; BUN 10; Creatinine, Ser 0.84; Hemoglobin 12.6; Platelets 309; Potassium 3.9; Sodium 138   Lipid Panel    Component Value Date/Time   CHOL 192 06/19/2014 2234   TRIG 90 06/19/2014 2234   HDL 52 06/19/2014 2234   CHOLHDL 3.7 06/19/2014 2234   VLDL 18 06/19/2014 2234   LDLCALC 122 (H)  06/19/2014 2234     Wt Readings from Last 3 Encounters:  06/22/19 200 lb (90.7 kg)  06/12/19 197 lb 3.2 oz (89.4 kg)  05/15/19 197 lb (89.4 kg)     Other studies Reviewed: Additional studies/ records that were reviewed today include:  Review of the above records demonstrates:    Assessment and Plan:   1. CAD with unstable angina: Gated cardiac CTA with evidence of LAD stenosis.  She continues to have chest pain. Cardiac cath is indicated. I will plan cardiac cath at [facility] 06/29/19 at 10:30 am.  I have reviewed the risks, indications, and alternatives to cardiac catheterization, possible angioplasty, and stenting with the patient. Risks include but are not limited to bleeding, infection, vascular injury, stroke, myocardial infection, arrhythmia, kidney injury, radiation-related injury in the case of prolonged fluoroscopy use, emergency cardiac surgery, and death. The patient understands the risks of serious complication is 1-2 in [id number] with diagnostic cardiac cath and 1-2% or less with angioplasty/stenting. - We will give her a prescription for NTG to use as needed today.  -Continue ASA and beta blocker.  -Start Crestor 10 mg daily   Current medicines are reviewed at length with the patient today.  The patient does not have concerns regarding medicines.  The following changes have been made:  no change  Labs/ tests ordered today include:   Orders Placed This Encounter  Procedures  . EKG 12-Lead   Disposition:   FU with me or office APP in 4-6 weeks.    Signed, [name], MD 06/22/2019 4:32 PM    [facility] [place], [place], [place]  [address] Phone: [phone]; Fax: [phone]

## 2019-06-22 NOTE — Patient Instructions (Addendum)
Medication Instructions:  Your physician has recommended you make the following change in your medication:  1.) start rosuvastatin (Crestor) 10 mg once a day 2.) nitroglycerin (NTG) sublingual - place one tablet under your tongue every 5 minutes as needed for chest pain.  --up to 3 doses --if you need to take the 3rd dose, -take it and call 911. 3.) aspirin 81 mg daily  *If you need a refill on your cardiac medications before your next appointment, please call your pharmacy*   Lab Work: none If you have labs (blood work) drawn today and your tests are completely normal, you will receive your results only by: Marland Kitchen MyChart Message (if you have MyChart) OR . A paper copy in the mail If you have any lab test that is abnormal or we need to change your treatment, we will call you to review the results.   Testing/Procedures: Your physician has requested that you have a cardiac catheterization. Cardiac catheterization is used to diagnose and/or treat various heart conditions. Doctors may recommend this procedure for a number of different reasons. The most common reason is to evaluate chest pain. Chest pain can be a symptom of coronary artery disease (CAD), and cardiac catheterization can show whether plaque is narrowing or blocking your heart's arteries. This procedure is also used to evaluate the valves, as well as measure the blood flow and oxygen levels in different parts of your heart. For further information please visit [email or web address]. Please follow instruction sheet, as given.   Follow-Up: At [facility], you and your health needs are our priority.  As part of our continuing mission to provide you with exceptional heart care, we have created designated Provider Care Teams.  These Care Teams include your primary Cardiologist (physician) and Advanced Practice Providers (APPs -  Physician Assistants and Nurse Practitioners) who all work together to provide you with the care you need, when you  need it.  Your next appointment:   4 week(s)  The format for your next appointment:   In Person  Provider:   You may see Dr. [name] or one of the following Advanced Practice Providers on your designated Care Team:    Melina Copa, PA-C  Ermalinda Barrios, PA-C    Other Instructions:  The cath lab has blocked a spot for you on 06/29/19 at 10:30.  Once we are able to schedule this, we will notify you and provide instructions.

## 2019-06-23 ENCOUNTER — Encounter: Payer: 59 | Admitting: Obstetrics & Gynecology

## 2019-06-23 ENCOUNTER — Encounter: Payer: Self-pay | Admitting: *Deleted

## 2019-06-26 ENCOUNTER — Ambulatory Visit (INDEPENDENT_AMBULATORY_CARE_PROVIDER_SITE_OTHER): Payer: 59

## 2019-06-26 ENCOUNTER — Encounter: Payer: Self-pay | Admitting: Orthopaedic Surgery

## 2019-06-26 ENCOUNTER — Ambulatory Visit (INDEPENDENT_AMBULATORY_CARE_PROVIDER_SITE_OTHER): Payer: 59 | Admitting: Orthopaedic Surgery

## 2019-06-26 ENCOUNTER — Other Ambulatory Visit: Payer: Self-pay

## 2019-06-26 ENCOUNTER — Other Ambulatory Visit (HOSPITAL_COMMUNITY)
Admission: RE | Admit: 2019-06-26 | Discharge: 2019-06-26 | Disposition: A | Discharge status: Home / Self Care | Payer: 59 | Source: Ambulatory Visit | Attending: Cardiovascular Disease | Admitting: Cardiovascular Disease

## 2019-06-26 ENCOUNTER — Telehealth: Payer: Self-pay | Admitting: Cardiovascular Disease

## 2019-06-26 DIAGNOSIS — S52132A Displaced fracture of neck of left radius, initial encounter for closed fracture: Secondary | ICD-10-CM | POA: Diagnosis not present

## 2019-06-26 DIAGNOSIS — Z20822 Contact with and (suspected) exposure to covid-19: Secondary | ICD-10-CM | POA: Diagnosis not present

## 2019-06-26 DIAGNOSIS — Z01812 Encounter for preprocedural laboratory examination: Secondary | ICD-10-CM | POA: Insufficient documentation

## 2019-06-26 LAB — SARS CORONAVIRUS 2 (TAT 6-24 HRS): SARS Coronavirus 2: NEGATIVE

## 2019-06-26 NOTE — Telephone Encounter (Signed)
I spoke to the patient who is calling because she had CP last night and today she is "clammy/sweaty".  She is scheduled for a heart cath on 4/15 and prefers to wait until then.   I told her that if pain returns or symptoms worsen, she should go to the ED for further evaluation.  She will monitor and verbalized understanding.

## 2019-06-26 NOTE — Progress Notes (Signed)
The patient is well-known to me.  She is 4 weeks status post a mechanical fall in which she sustained a left elbow radial neck fracture.  She is first in a splint now we have her just in an elastic sleeve around her elbow which she wears for comfort.  She does report elbow pain but has had better flexion extension of the elbow.  Of note she is scheduled to have a cardiac catheterization this week due to significant blockage.  She also has surgery at a later date for removing her breast mass.  She does still report left wrist pain as well.  On examination of her left elbow her flexion is now full and her extension is almost full.  Her pronation and supination are quite limited.  The DRUJ of the left wrist is stable.  2 views left elbow are obtained and show that the fracture is showing signs of healing of the radial neck and the elbow is well located.  This is an extra-articular fracture.  At this point she will still slowly increase her mobility and motion of the elbow as comfort allows.  She will still avoid any heavy lifting of the left elbow.  I wish her luck on her upcoming catheterization of her heart.  We will see her back in 4 weeks with a repeat 2 views of the left elbow.

## 2019-06-26 NOTE — Telephone Encounter (Signed)
New message   Pt c/o of Chest Pain: STAT if CP now or developed within 24 hours  1. Are you having CP right now?no   2. Are you experiencing any other symptoms (ex. SOB, nausea, vomiting, sweating)? Patient has a cold clammy sweat, left side of neck hurts   3. How long have you been experiencing CP? Had chest pains last night   4. Is your CP continuous or coming and going?coming and going   5. Have you taken Nitroglycerin? No  ?

## 2019-06-27 ENCOUNTER — Inpatient Hospital Stay (HOSPITAL_COMMUNITY): Admission: RE | Admit: 2019-06-27 | Payer: 59 | Source: Ambulatory Visit

## 2019-06-27 ENCOUNTER — Telehealth: Payer: Self-pay | Admitting: *Deleted

## 2019-06-27 NOTE — Telephone Encounter (Signed)
Pt contacted pre-catheterization scheduled at [facility] for: Thursday June 29, 2019 10:30 AM Verified arrival time and place: [place] [facility]) at: 8:30 AM   No solid food after midnight prior to cath, clear liquids until 5 AM day of procedure.  Hold: Losartan-HCT-AM of procedure   Except hold medications AM meds can be  taken pre-cath with sip of water including: ASA 81 mg   Confirmed patient has responsible adult to drive home post procedure and observe 24 hours after arriving home: yes  Currently, due to Covid-19 pandemic, only one person will be allowed with patient. Must be the same person for patient's entire stay and will be required to wear a mask. They will be asked to wait in the waiting room for the duration of the patient's stay.  Patients are required to wear a mask when they enter the hospital.      COVID-19 Pre-Screening Questions:  . In the past 7 to 10 days have you had a cough,  shortness of breath, headache, congestion, fever (100 or greater) body aches, chills, sore throat, or sudden loss of taste or sense of smell? Shortness of breath, not new . Have you been around anyone with known Covid 19 in the past 7 to 10 days? no . Have you been around anyone who is awaiting Covid 19 test results in the past 7 to 10 days? no . Have you been around anyone who has mentioned symptoms of Covid 19 within the past 7 to 10 days? no  Reviewed procedure/mask/visitor instructions, COVID-19 screening questions with patient.

## 2019-06-29 ENCOUNTER — Ambulatory Visit (HOSPITAL_BASED_OUTPATIENT_CLINIC_OR_DEPARTMENT_OTHER)
Admission: RE | Admit: 2019-06-29 | Discharge: 2019-06-30 | Disposition: A | Discharge status: Home / Self Care | Payer: 59 | Attending: Cardiovascular Disease | Admitting: Cardiovascular Disease

## 2019-06-29 ENCOUNTER — Other Ambulatory Visit: Payer: Self-pay

## 2019-06-29 ENCOUNTER — Ambulatory Visit (HOSPITAL_COMMUNITY)
Admission: RE | Disposition: A | Discharge status: Home / Self Care | Payer: 59 | Source: Home / Self Care | Attending: Cardiovascular Disease

## 2019-06-29 ENCOUNTER — Encounter (HOSPITAL_COMMUNITY)
Admission: RE | Disposition: A | Discharge status: Home / Self Care | Payer: 59 | Source: Home / Self Care | Attending: Cardiovascular Disease

## 2019-06-29 DIAGNOSIS — I10 Essential (primary) hypertension: Secondary | ICD-10-CM | POA: Insufficient documentation

## 2019-06-29 DIAGNOSIS — Z79890 Hormone replacement therapy (postmenopausal): Secondary | ICD-10-CM | POA: Diagnosis not present

## 2019-06-29 DIAGNOSIS — Z882 Allergy status to sulfonamides status: Secondary | ICD-10-CM | POA: Insufficient documentation

## 2019-06-29 DIAGNOSIS — Z853 Personal history of malignant neoplasm of breast: Secondary | ICD-10-CM | POA: Diagnosis not present

## 2019-06-29 DIAGNOSIS — Z79899 Other long term (current) drug therapy: Secondary | ICD-10-CM | POA: Diagnosis not present

## 2019-06-29 DIAGNOSIS — I200 Unstable angina: Secondary | ICD-10-CM

## 2019-06-29 DIAGNOSIS — E785 Hyperlipidemia, unspecified: Secondary | ICD-10-CM | POA: Insufficient documentation

## 2019-06-29 DIAGNOSIS — I341 Nonrheumatic mitral (valve) prolapse: Secondary | ICD-10-CM | POA: Insufficient documentation

## 2019-06-29 DIAGNOSIS — Z955 Presence of coronary angioplasty implant and graft: Secondary | ICD-10-CM

## 2019-06-29 DIAGNOSIS — J449 Chronic obstructive pulmonary disease, unspecified: Secondary | ICD-10-CM | POA: Diagnosis not present

## 2019-06-29 DIAGNOSIS — F419 Anxiety disorder, unspecified: Secondary | ICD-10-CM | POA: Diagnosis not present

## 2019-06-29 DIAGNOSIS — E559 Vitamin D deficiency, unspecified: Secondary | ICD-10-CM | POA: Diagnosis not present

## 2019-06-29 DIAGNOSIS — Z885 Allergy status to narcotic agent status: Secondary | ICD-10-CM | POA: Diagnosis not present

## 2019-06-29 DIAGNOSIS — D369 Benign neoplasm, unspecified site: Secondary | ICD-10-CM

## 2019-06-29 DIAGNOSIS — K449 Diaphragmatic hernia without obstruction or gangrene: Secondary | ICD-10-CM | POA: Insufficient documentation

## 2019-06-29 DIAGNOSIS — Z87891 Personal history of nicotine dependence: Secondary | ICD-10-CM | POA: Insufficient documentation

## 2019-06-29 DIAGNOSIS — E039 Hypothyroidism, unspecified: Secondary | ICD-10-CM | POA: Diagnosis not present

## 2019-06-29 DIAGNOSIS — Z923 Personal history of irradiation: Secondary | ICD-10-CM | POA: Diagnosis not present

## 2019-06-29 DIAGNOSIS — Z7902 Long term (current) use of antithrombotics/antiplatelets: Secondary | ICD-10-CM | POA: Insufficient documentation

## 2019-06-29 DIAGNOSIS — Z85850 Personal history of malignant neoplasm of thyroid: Secondary | ICD-10-CM | POA: Diagnosis not present

## 2019-06-29 DIAGNOSIS — Z7982 Long term (current) use of aspirin: Secondary | ICD-10-CM | POA: Diagnosis not present

## 2019-06-29 DIAGNOSIS — I25110 Atherosclerotic heart disease of native coronary artery with unstable angina pectoris: Secondary | ICD-10-CM

## 2019-06-29 HISTORY — PX: CORONARY STENT INTERVENTION: CATH118234

## 2019-06-29 HISTORY — PX: LEFT HEART CATH AND CORONARY ANGIOGRAPHY: CATH118249

## 2019-06-29 LAB — MRSA PCR SCREENING: MRSA by PCR: NEGATIVE

## 2019-06-29 LAB — POCT ACTIVATED CLOTTING TIME: Activated Clotting Time: 301 seconds

## 2019-06-29 SURGERY — LEFT HEART CATH AND CORONARY ANGIOGRAPHY
Anesthesia: LOCAL

## 2019-06-29 SURGERY — BREAST LUMPECTOMY WITH RADIOACTIVE SEED LOCALIZATION
Anesthesia: General | Site: Breast | Laterality: Right

## 2019-06-29 MED ORDER — ASPIRIN 81 MG PO CHEW: 81.0000 mg | CHEWABLE_TABLET | Freq: Every day | ORAL | Status: DC

## 2019-06-29 MED ORDER — SODIUM CHLORIDE 0.9 % IV SOLN: INTRAVENOUS | Status: AC

## 2019-06-29 MED ORDER — NITROGLYCERIN 0.4 MG SL SUBL: 0.4000 mg | SUBLINGUAL_TABLET | SUBLINGUAL | Status: DC | PRN

## 2019-06-29 MED ORDER — SODIUM CHLORIDE 0.9% FLUSH: 3.0000 mL | INTRAVENOUS | Status: DC | PRN

## 2019-06-29 MED ORDER — LIDOCAINE HCL (PF) 1 % IJ SOLN
INTRAMUSCULAR | Status: AC
  Filled 2019-06-29: qty 30

## 2019-06-29 MED ORDER — IBUPROFEN 200 MG PO TABS: 600.0000 mg | ORAL_TABLET | Freq: Four times a day (QID) | ORAL | Status: DC | PRN

## 2019-06-29 MED ORDER — SODIUM CHLORIDE 0.9 % IV SOLN: 250.0000 mL | INTRAVENOUS | Status: DC | PRN

## 2019-06-29 MED ORDER — MELATONIN 3 MG PO TABS
3.0000 mg | ORAL_TABLET | Freq: Every day | ORAL | Status: AC
  Administered 2019-06-29: 22:00:00 3 mg via ORAL
  Filled 2019-06-29: qty 1

## 2019-06-29 MED ORDER — NITROGLYCERIN 1 MG/10 ML FOR IR/CATH LAB
INTRA_ARTERIAL | Status: AC
  Filled 2019-06-29: qty 10

## 2019-06-29 MED ORDER — HEPARIN SODIUM (PORCINE) 1000 UNIT/ML IJ SOLN
INTRAMUSCULAR | Status: DC | PRN
  Administered 2019-06-29: 4500 [IU] via INTRAVENOUS
  Administered 2019-06-29: 5500 [IU] via INTRAVENOUS

## 2019-06-29 MED ORDER — SODIUM CHLORIDE 0.9 % WEIGHT BASED INFUSION
3.0000 mL/kg/h | INTRAVENOUS | Status: DC
  Administered 2019-06-29: 3 mL/kg/h via INTRAVENOUS

## 2019-06-29 MED ORDER — LABETALOL HCL 5 MG/ML IV SOLN
INTRAVENOUS | Status: AC
  Filled 2019-06-29: qty 4

## 2019-06-29 MED ORDER — LEVOTHYROXINE SODIUM 25 MCG PO TABS
137.0000 ug | ORAL_TABLET | Freq: Every day | ORAL | Status: DC
  Administered 2019-06-29: 137 ug via ORAL
  Filled 2019-06-29: qty 1

## 2019-06-29 MED ORDER — CLOPIDOGREL BISULFATE 300 MG PO TABS
ORAL_TABLET | ORAL | Status: DC | PRN
  Administered 2019-06-29: 600 mg via ORAL

## 2019-06-29 MED ORDER — CLOPIDOGREL BISULFATE 300 MG PO TABS
ORAL_TABLET | ORAL | Status: AC
  Filled 2019-06-29: qty 2

## 2019-06-29 MED ORDER — HYDRALAZINE HCL 20 MG/ML IJ SOLN: 10.0000 mg | INTRAMUSCULAR | Status: AC | PRN

## 2019-06-29 MED ORDER — LOSARTAN POTASSIUM 50 MG PO TABS
50.0000 mg | ORAL_TABLET | Freq: Every day | ORAL | Status: DC
  Administered 2019-06-30: 10:00:00 50 mg via ORAL
  Filled 2019-06-29: qty 1

## 2019-06-29 MED ORDER — MIDAZOLAM HCL 2 MG/2ML IJ SOLN
INTRAMUSCULAR | Status: AC
  Filled 2019-06-29: qty 2

## 2019-06-29 MED ORDER — ONDANSETRON HCL 4 MG PO TABS: 4.0000 mg | ORAL_TABLET | Freq: Four times a day (QID) | ORAL | Status: DC | PRN

## 2019-06-29 MED ORDER — HEPARIN SODIUM (PORCINE) 1000 UNIT/ML IJ SOLN
INTRAMUSCULAR | Status: AC
  Filled 2019-06-29: qty 2

## 2019-06-29 MED ORDER — FAMOTIDINE IN NACL 20-0.9 MG/50ML-% IV SOLN
INTRAVENOUS | Status: AC | PRN
  Administered 2019-06-29: 20 mg via INTRAVENOUS

## 2019-06-29 MED ORDER — FENTANYL CITRATE (PF) 100 MCG/2ML IJ SOLN
INTRAMUSCULAR | Status: DC | PRN
  Administered 2019-06-29 (×2): 25 ug via INTRAVENOUS
  Administered 2019-06-29: 50 ug via INTRAVENOUS

## 2019-06-29 MED ORDER — LOSARTAN POTASSIUM-HCTZ 50-12.5 MG PO TABS: 1.0000 | ORAL_TABLET | Freq: Every day | ORAL | Status: DC

## 2019-06-29 MED ORDER — VENLAFAXINE HCL ER 150 MG PO CP24
150.0000 mg | ORAL_CAPSULE | Freq: Every day | ORAL | Status: DC
  Filled 2019-06-29: qty 1

## 2019-06-29 MED ORDER — VITAMIN D (ERGOCALCIFEROL) 1.25 MG (50000 UNIT) PO CAPS: 50000.0000 [IU] | ORAL_CAPSULE | ORAL | Status: DC

## 2019-06-29 MED ORDER — HEPARIN (PORCINE) IN NACL 1000-0.9 UT/500ML-% IV SOLN
INTRAVENOUS | Status: AC
  Filled 2019-06-29: qty 1000

## 2019-06-29 MED ORDER — ANASTROZOLE 1 MG PO TABS
1.0000 mg | ORAL_TABLET | Freq: Every day | ORAL | Status: DC
  Filled 2019-06-29: qty 1

## 2019-06-29 MED ORDER — ACETAMINOPHEN 325 MG PO TABS: 650.0000 mg | ORAL_TABLET | ORAL | Status: DC | PRN

## 2019-06-29 MED ORDER — IOHEXOL 350 MG/ML SOLN
INTRAVENOUS | Status: DC | PRN
  Administered 2019-06-29: 13:00:00 105 mL via INTRA_ARTERIAL

## 2019-06-29 MED ORDER — FAMOTIDINE IN NACL 20-0.9 MG/50ML-% IV SOLN
INTRAVENOUS | Status: AC
  Filled 2019-06-29: qty 50

## 2019-06-29 MED ORDER — ASPIRIN EC 81 MG PO TBEC
81.0000 mg | DELAYED_RELEASE_TABLET | Freq: Every day | ORAL | Status: DC
  Administered 2019-06-30: 81 mg via ORAL
  Filled 2019-06-29: qty 1

## 2019-06-29 MED ORDER — ASPIRIN 81 MG PO CHEW: 81.0000 mg | CHEWABLE_TABLET | ORAL | Status: DC

## 2019-06-29 MED ORDER — FENTANYL CITRATE (PF) 100 MCG/2ML IJ SOLN
INTRAMUSCULAR | Status: AC
  Filled 2019-06-29: qty 2

## 2019-06-29 MED ORDER — VENLAFAXINE HCL ER 150 MG PO CP24
150.0000 mg | ORAL_CAPSULE | Freq: Every day | ORAL | Status: DC
  Administered 2019-06-29: 22:00:00 150 mg via ORAL
  Filled 2019-06-29 (×2): qty 1

## 2019-06-29 MED ORDER — HYDROCHLOROTHIAZIDE 12.5 MG PO CAPS
12.5000 mg | ORAL_CAPSULE | Freq: Every day | ORAL | Status: DC
  Administered 2019-06-30: 12.5 mg via ORAL
  Filled 2019-06-29: qty 1

## 2019-06-29 MED ORDER — LABETALOL HCL 5 MG/ML IV SOLN
10.0000 mg | INTRAVENOUS | Status: AC | PRN
  Administered 2019-06-29: 10 mg via INTRAVENOUS

## 2019-06-29 MED ORDER — CLOPIDOGREL BISULFATE 75 MG PO TABS
75.0000 mg | ORAL_TABLET | Freq: Every day | ORAL | Status: DC
  Administered 2019-06-30: 10:00:00 75 mg via ORAL
  Filled 2019-06-29: qty 1

## 2019-06-29 MED ORDER — ALBUTEROL SULFATE (2.5 MG/3ML) 0.083% IN NEBU: 3.0000 mL | INHALATION_SOLUTION | Freq: Four times a day (QID) | RESPIRATORY_TRACT | Status: DC | PRN

## 2019-06-29 MED ORDER — VERAPAMIL HCL 2.5 MG/ML IV SOLN
INTRAVENOUS | Status: DC | PRN
  Administered 2019-06-29: 13:00:00 10 mL via INTRA_ARTERIAL

## 2019-06-29 MED ORDER — SODIUM CHLORIDE 0.9% FLUSH
3.0000 mL | Freq: Two times a day (BID) | INTRAVENOUS | Status: DC
  Administered 2019-06-29: 3 mL via INTRAVENOUS

## 2019-06-29 MED ORDER — ROSUVASTATIN CALCIUM 5 MG PO TABS
10.0000 mg | ORAL_TABLET | Freq: Every day | ORAL | Status: DC
  Administered 2019-06-29: 22:00:00 10 mg via ORAL
  Filled 2019-06-29: qty 2

## 2019-06-29 MED ORDER — MIDAZOLAM HCL 2 MG/2ML IJ SOLN
INTRAMUSCULAR | Status: DC | PRN
  Administered 2019-06-29: 2 mg via INTRAVENOUS
  Administered 2019-06-29: 1 mg via INTRAVENOUS

## 2019-06-29 MED ORDER — ONDANSETRON HCL 4 MG/2ML IJ SOLN: 4.0000 mg | Freq: Four times a day (QID) | INTRAMUSCULAR | Status: DC | PRN

## 2019-06-29 MED ORDER — SODIUM CHLORIDE 0.9 % WEIGHT BASED INFUSION
1.0000 mL/kg/h | INTRAVENOUS | Status: DC
  Administered 2019-06-29: 09:00:00 1 mL/kg/h via INTRAVENOUS

## 2019-06-29 MED ORDER — CLOPIDOGREL BISULFATE 75 MG PO TABS: 75.0000 mg | ORAL_TABLET | Freq: Every day | ORAL | Status: DC

## 2019-06-29 MED ORDER — SODIUM CHLORIDE 0.9 % IV SOLN: 500.0000 mL | Freq: Once | INTRAVENOUS | Status: DC

## 2019-06-29 MED ORDER — HEPARIN (PORCINE) IN NACL 1000-0.9 UT/500ML-% IV SOLN
INTRAVENOUS | Status: DC | PRN
  Administered 2019-06-29 (×2): 500 mL

## 2019-06-29 MED ORDER — SODIUM CHLORIDE 0.9% FLUSH: 3.0000 mL | Freq: Two times a day (BID) | INTRAVENOUS | Status: DC

## 2019-06-29 MED ORDER — LIDOCAINE HCL (PF) 1 % IJ SOLN
INTRAMUSCULAR | Status: DC | PRN
  Administered 2019-06-29: 2 mL via INTRADERMAL

## 2019-06-29 SURGICAL SUPPLY — 16 items
BALLN SAPPHIRE 2.0X12 (BALLOONS) ×2
BALLN SAPPHIRE ~~LOC~~ 3.25X15 (BALLOONS) ×1 IMPLANT
BALLOON SAPPHIRE 2.0X12 (BALLOONS) IMPLANT
CATH 5FR JL3.5 JR4 ANG PIG MP (CATHETERS) ×1 IMPLANT
CATH VISTA GUIDE 6FR XBLAD3.5 (CATHETERS) ×1 IMPLANT
DEVICE RAD COMP TR BAND LRG (VASCULAR PRODUCTS) ×1 IMPLANT
GLIDESHEATH SLEND SS 6F .021 (SHEATH) ×1 IMPLANT
GUIDEWIRE INQWIRE 1.5J.035X260 (WIRE) IMPLANT
INQWIRE 1.5J .035X260CM (WIRE) ×2
KIT ENCORE 26 ADVANTAGE (KITS) ×1 IMPLANT
KIT HEART LEFT (KITS) ×2 IMPLANT
PACK CARDIAC CATHETERIZATION (CUSTOM PROCEDURE TRAY) ×2 IMPLANT
STENT RESOLUTE ONYX 3.0X18 (Permanent Stent) ×1 IMPLANT
TRANSDUCER W/STOPCOCK (MISCELLANEOUS) ×2 IMPLANT
TUBING CIL FLEX 10 FLL-RA (TUBING) ×2 IMPLANT
WIRE COUGAR XT STRL 190CM (WIRE) ×1 IMPLANT

## 2019-06-29 NOTE — Interval H&P Note (Signed)
History and Physical Interval Note:  06/29/2019 10:14 AM  Bethany [name]  has presented today for surgery, with the diagnosis of Abnormal CT.  The various methods of treatment have been discussed with the patient and family. After consideration of risks, benefits and other options for treatment, the patient has consented to  Procedure(s): LEFT HEART CATH AND CORONARY ANGIOGRAPHY (N/A) as a surgical intervention.  The patient's history has been reviewed, patient examined, no change in status, stable for surgery.  I have reviewed the patient's chart and labs.  Questions were answered to the patient's satisfaction.    Cath Lab Visit (complete for each Cath Lab visit)  Clinical Evaluation Leading to the Procedure:   ACS: No.  Non-ACS:    Anginal Classification: CCS III  Anti-ischemic medical therapy: No Therapy  Non-Invasive Test Results: High-risk stress test findings: cardiac mortality >3%/year Coronary CTA with severe LAD lesion  Prior CABG: No previous CABG        [name]

## 2019-06-29 NOTE — Progress Notes (Signed)
TR BAND REMOVAL  LOCATION:    Radial  Right radial arterial site  DEFLATED PER PROTOCOL:   yes  TIME BAND OFF / DRESSING APPLIED:    1900/gauze and tegaderm  SITE UPON ARRIVAL:    Level 0  SITE AFTER BAND REMOVAL:    Level 0  CIRCULATION SENSATION AND MOVEMENT:    Within Normal Limits : rt hand and fingers warm and pink, sensation present, rt arm resting up on pillow  COMMENTS:

## 2019-06-30 ENCOUNTER — Encounter: Payer: Self-pay | Admitting: Cardiology

## 2019-06-30 DIAGNOSIS — E785 Hyperlipidemia, unspecified: Secondary | ICD-10-CM

## 2019-06-30 DIAGNOSIS — I200 Unstable angina: Secondary | ICD-10-CM

## 2019-06-30 DIAGNOSIS — K449 Diaphragmatic hernia without obstruction or gangrene: Secondary | ICD-10-CM | POA: Diagnosis not present

## 2019-06-30 DIAGNOSIS — I25118 Atherosclerotic heart disease of native coronary artery with other forms of angina pectoris: Secondary | ICD-10-CM

## 2019-06-30 DIAGNOSIS — F419 Anxiety disorder, unspecified: Secondary | ICD-10-CM | POA: Diagnosis not present

## 2019-06-30 DIAGNOSIS — I10 Essential (primary) hypertension: Secondary | ICD-10-CM

## 2019-06-30 DIAGNOSIS — I25110 Atherosclerotic heart disease of native coronary artery with unstable angina pectoris: Secondary | ICD-10-CM | POA: Diagnosis not present

## 2019-06-30 DIAGNOSIS — E039 Hypothyroidism, unspecified: Secondary | ICD-10-CM | POA: Diagnosis not present

## 2019-06-30 LAB — BASIC METABOLIC PANEL
Anion gap: 10 (ref 5–15)
BUN: 13 mg/dL (ref 6–20)
CO2: 26 mmol/L (ref 22–32)
Calcium: 8.5 mg/dL — ABNORMAL LOW (ref 8.9–10.3)
Chloride: 104 mmol/L (ref 98–111)
Creatinine, Ser: 0.83 mg/dL (ref 0.44–1.00)
GFR calc Af Amer: >60 mL/min (ref >60)
GFR calc non Af Amer: >60 mL/min (ref >60)
Glucose, Bld: 96 mg/dL (ref 70–99)
Potassium: 3.5 mmol/L (ref 3.5–5.1)
Sodium: 140 mmol/L (ref 135–145)

## 2019-06-30 LAB — CBC
HCT: 37.6 % (ref 36.0–46.0)
Hemoglobin: 12.1 g/dL (ref 12.0–15.0)
MCH: 30.1 pg (ref 26.0–34.0)
MCHC: 32.2 g/dL (ref 30.0–36.0)
MCV: 93.5 fL (ref 80.0–100.0)
Platelets: 277 10*3/uL (ref 150–400)
RBC: 4.02 MIL/uL (ref 3.87–5.11)
RDW: 13.1 % (ref 11.5–15.5)
WBC: 5.7 10*3/uL (ref 4.0–10.5)
nRBC: 0 % (ref 0.0–0.2)

## 2019-06-30 MED ORDER — CLOPIDOGREL BISULFATE 75 MG PO TABS: 75.0000 mg | ORAL_TABLET | Freq: Every day | ORAL | 2 refills | Status: DC

## 2019-06-30 MED FILL — Nitroglycerin IV Soln 100 MCG/ML in D5W: INTRA_ARTERIAL | Qty: 10 | Status: AC

## 2019-06-30 MED FILL — CLOPIDOGREL 75 MG TABLET: 75 | 30 days supply | Qty: 30 | Fill #0

## 2019-06-30 NOTE — Plan of Care (Signed)
  Problem: Education: Goal: Knowledge of General Education information will improve Description: Including pain rating scale, medication(s)/side effects and non-pharmacologic comfort measures Outcome: Progressing   Problem: Health Behavior/Discharge Planning: Goal: Ability to manage health-related needs will improve Outcome: Progressing   Problem: Clinical Measurements: Goal: Ability to maintain clinical measurements within normal limits will improve Outcome: Progressing Goal: Will remain free from infection Outcome: Progressing Goal: Diagnostic test results will improve Outcome: Progressing Goal: Respiratory complications will improve Outcome: Progressing Goal: Cardiovascular complication will be avoided Outcome: Progressing   Problem: Activity: Goal: Risk for activity intolerance will decrease Outcome: Progressing   Problem: Education: Goal: Knowledge of General Education information will improve Description: Including pain rating scale, medication(s)/side effects and non-pharmacologic comfort measures Outcome: Progressing   Problem: Health Behavior/Discharge Planning: Goal: Ability to manage health-related needs will improve Outcome: Progressing   Problem: Clinical Measurements: Goal: Ability to maintain clinical measurements within normal limits will improve Outcome: Progressing Goal: Will remain free from infection Outcome: Progressing Goal: Diagnostic test results will improve Outcome: Progressing Goal: Respiratory complications will improve Outcome: Progressing Goal: Cardiovascular complication will be avoided Outcome: Progressing   Problem: Activity: Goal: Risk for activity intolerance will decrease Outcome: Progressing

## 2019-06-30 NOTE — Progress Notes (Signed)
CARDIAC REHAB PHASE I   PRE:  Rate/Rhythm: 70 SR  BP:  Supine: 127/79  Sitting:   Standing:    SaO2: 97%RA  MODE:  Ambulation: 400 ft   POST:  Rate/Rhythm: 86 SR  BP:  Supine:   Sitting: 152/92  Standing:    SaO2: 98%RA [id number] Pt's RN stated pt had some ectopy prior to my visit so I educated pt first to watch monitor. No ectopy seen during ed or during walk. Pt stated her breathing had improved with procedure.. Discussed importance of plavix with stent. Reviewed NTG use, heart healthy food choices, walking for exercise, and CRP 2. Referred to [place] program. Pt is interested in Virtual also. Pt told me that she fell and fractured her elbow 05/25/19. She is to see Dr Ninfa Linden in three weeks. Discussed with pt that CR would need his approval prior to participating in program. She has had a few falls in last 6 months that she is hoping will be eliminated since PCI. Pt knows to start her walking at home slowly and not to walk if any dizziness or lightheadedness. Pt is interested in participating in Virtual Cardiac and Pulmonary Rehab. Pt advised that Virtual Cardiac and Pulmonary Rehab is provided at no cost to the patient.  Checklist:  1. Pt has smart device  ie smartphone and/or ipad for downloading an app  Yes 2. Reliable internet/wifi service    Yes 3. Understands how to use their smartphone and navigate within an app.  Yes   Pt verbalized understanding and is in agreement.    Graylon Good, RN BSN  06/30/2019 10:17 AM

## 2019-06-30 NOTE — Discharge Summary (Addendum)
Discharge Summary    Patient ID: Bethany [name],  MRN: [id number], DOB/AGE: 04-06-1960 59 y.o.  Admit date: 06/29/2019 Discharge date: 06/30/2019  Primary Care Provider: Leeroy Cha Primary Cardiologist: [name], MD  Discharge Diagnoses    Principal Problem:   Unstable angina [facility]) Active Problems:   Hypertension   Hyperlipidemia  Allergies Allergies  Allergen Reactions   Oxycodone Nausea And Vomiting   Latex Rash   Sulfa Antibiotics     hives    Diagnostic Studies/Procedures    Cath: 06/29/19  Mid LAD lesion is 80% stenosed. A drug-eluting stent was successfully placed using a STENT RESOLUTE ONYX 3.0X18. Post intervention, there is a 0% residual stenosis.   1. Severe stenosis mid LAD 2. Successful PTCA/DES x 1 mid LAD   Recommendations: 3 months of DAPT with ASA and Plavix and then will stop for her breast surgery.   Diagnostic Dominance: Right  Intervention    _____________   History of Present Illness     59 yo female with history of CAD, breast cancer in 2019 s/p radiation therapy, hiatal hernia, thyroid cancer, hypothyroidism, HTN former tobacco abuse and anxiety here today for cardiac follow up. She was seen as a new patient by Dr. Angelena Form for evaluation of chest pain 05/15/19. She was seen prior to this visit in the ED January 2021 with chest pain. Troponin was negative. EKG without ischemic changes. Normal EGD per Dr. Henrene Pastor 04/12/19 with no cause for chest pain isolated. She is known to have radiation induced lung fibrosis. Chest CT August 2020 with evidence of coronary calcification. It was arranged for a gated cardiac CTA on 06/20/19 and this showed a LAD stenosis that appeared  to be flow limiting. Echo 06/05/19 showed normal LV systolic function and no valve disease She has been diagnosed with breast cancer in 2019 and has been undergoing radiation therapy with plans for lumpectomy on 06/29/19.     Given her symptoms and CT  finding it was recommended that she undergo cardiac cath. Started on Crestor 10mg  daily at her office visit.   Hospital Course     Underwent cardiac cath noted above with PCI/DES of the pLAD with Dr. Angelena Form. Placed on DAPT with ASA/plavix for at least 3 months, then ok to stop her her breat surgery. No complications noted overnight. Worked well with cardiac rehab without recurrent chest pain. She will be continued on home medications without significant change other than the addition of plavix.   Bethany [name] was seen by Dr. Gwenlyn Found and determined stable for discharge home. Follow up in the office has been arranged. Medications are listed below.   _____________  Discharge Vitals Blood pressure 127/79, pulse 66, temperature 97.7 F (36.5 C), temperature source Oral, resp. rate 13, height 5\' 6"  (1.676 m), weight 88.5 kg, SpO2 98 %.  Filed Weights   06/29/19 0902  Weight: 88.5 kg    Labs & Radiologic Studies    CBC Recent Labs    06/30/19 0555  WBC 5.7  HGB 12.1  HCT 37.6  MCV 93.5  PLT 99991111   Basic Metabolic Panel Recent Labs    06/30/19 0555  NA 140  K 3.5  CL 104  CO2 26  GLUCOSE 96  BUN 13  CREATININE 0.83  CALCIUM 8.5*   Liver Function Tests No results for input(s): AST, ALT, ALKPHOS, BILITOT, PROT, ALBUMIN in the last 72 hours. No results for input(s): LIPASE, AMYLASE in the last 72 hours. Cardiac Enzymes  No results for input(s): CKTOTAL, CKMB, CKMBINDEX, TROPONINI in the last 72 hours. BNP Invalid input(s): POCBNP D-Dimer No results for input(s): DDIMER in the last 72 hours. Hemoglobin A1C No results for input(s): HGBA1C in the last 72 hours. Fasting Lipid Panel No results for input(s): CHOL, HDL, LDLCALC, TRIG, CHOLHDL, LDLDIRECT in the last 72 hours. Thyroid Function Tests No results for input(s): TSH, T4TOTAL, T3FREE, THYROIDAB in the last 72 hours.  Invalid input(s): FREET3 _____________  CARDIAC CATHETERIZATION  Result Date: 06/29/2019  Mid  LAD lesion is 80% stenosed.  A drug-eluting stent was successfully placed using a STENT RESOLUTE ONYX 3.0X18.  Post intervention, there is a 0% residual stenosis.  1. Severe stenosis mid LAD 2. Successful PTCA/DES x 1 mid LAD Recommendations: 3 months of DAPT with ASA and Plavix and then will stop for her breast surgery.   CT CORONARY MORPH W/CTA COR W/SCORE W/CA W/CM &/OR WO/CM  Result Date: 06/20/2019 CLINICAL DATA:  Chest pain EXAM: Cardiac CTA MEDICATIONS: Sub lingual nitro.  4mg  TECHNIQUE: The patient was scanned on a Siemens Force [id number] slice scanner. Gantry rotation speed was 250 msecs. Collimation was .6 mm. A 100 kV prospective scan was triggered in the ascending thoracic aorta at 140 HU's Full mA was used between 35% and 75% of the R-R interval. Average HR during the scan was 51 bpm. The 3D data set was interpreted on a dedicated work station using MPR, MIP and VRT modes. A total of 80cc of contrast was used. FINDINGS: Non-cardiac: See separate report from [facility]. No significant findings on limited lung and soft tissue windows. Calcium Score: Calcium noted in proximal and mid LAD and RCA Coronary Arteries: Right dominant with no anomalies LM: Normal LAD: 25-49% calcified plaque in proximal vessel > 70% mixed plaque in mid LAD D1: Small vessel D2: Small vessel Circumflex: Normal OM1: Normal OM2: Normal RCA: 1-24% calcified plaque in proximal vessel distal vessel is tortuous PDA: Normal PLA: Normal IMPRESSION: 1.  Calcium score 263 which is 98 th percentile for age/sex 2.   Normal aortic root 3.6 cm 3. CAD-RADS 4 likely > 70% hemodynamically significant stenosis in mid LAD Study sent for FFR CT [place] Electronically Signed   By: Jenkins Rouge M.D.   On: 06/20/2019 17:53   CT CORONARY FRACTIONAL FLOW RESERVE DATA PREP  Result Date: 06/21/2019 CLINICAL DATA:  CAD EXAM: FFR CT TECHNIQUE: The best systolic and diastolic phases of the patients gated cardiac CTA sent for hemodynamic  analysis FINDINGS: FFR CT positive in mid LAD 0.79 and 0.76 in distal LAD IMPRESSION: FFR CT suggesting hemodynamically significant CAD in mid LAD Patient will be referred for heart catheterization [place] Electronically Signed   By: Jenkins Rouge M.D.   On: 06/21/2019 09:33   ECHOCARDIOGRAM COMPLETE  Result Date: 06/05/2019    ECHOCARDIOGRAM REPORT   Patient Name:   KATALIYAH PRINZO [facility] Date of Exam: 06/05/2019 Medical Rec #:  [id number]       Height:       65.0 in Accession #:    [id number]      Weight:       197.0 lb Date of Birth:  04/10/1960       BSA:          1.965 m Patient Age:    41 years        BP:           122/80 mmHg Patient Gender: F  HR:           70 bpm. Exam Location:  [place] Procedure: 2D Echo, Cardiac Doppler and Color Doppler Indications:    R07.9  History:        Patient has no prior history of Echocardiogram examinations.                 CAD, COPD, Signs/Symptoms:Chest Pain; Risk Factors:Former Smoker                 and Hypertension.  Sonographer:    Coralyn Helling RDCS Referring Phys: [place]  1. Left ventricular ejection fraction, by estimation, is 60 to 65%. The left ventricle has normal function. The left ventricle has no regional wall motion abnormalities. There is mild asymmetric left ventricular hypertrophy. Left ventricular diastolic parameters were normal.  2. Right ventricular systolic function is normal. The right ventricular size is normal. There is mildly elevated pulmonary artery systolic pressure.  3. The mitral valve is normal in structure. No evidence of mitral valve regurgitation. No evidence of mitral stenosis.  4. The aortic valve is normal in structure. Aortic valve regurgitation is not visualized. No aortic stenosis is present. FINDINGS  Left Ventricle: Left ventricular ejection fraction, by estimation, is 60 to 65%. The left ventricle has normal function. The left ventricle has no regional wall motion abnormalities.  The left ventricular internal cavity size was normal in size. There is  mild asymmetric left ventricular hypertrophy. Left ventricular diastolic parameters were normal. Right Ventricle: The right ventricular size is normal. No increase in right ventricular wall thickness. Right ventricular systolic function is normal. There is mildly elevated pulmonary artery systolic pressure. The tricuspid regurgitant velocity is 2.31  m/s, and with an assumed right atrial pressure of 10 mmHg, the estimated right ventricular systolic pressure is 123456 mmHg. Left Atrium: Left atrial size was normal in size. Right Atrium: Right atrial size was normal in size. Pericardium: There is no evidence of pericardial effusion. Mitral Valve: The mitral valve is normal in structure. No evidence of mitral valve regurgitation. No evidence of mitral valve stenosis. Tricuspid Valve: The tricuspid valve is normal in structure. Tricuspid valve regurgitation is trivial. Aortic Valve: The aortic valve is normal in structure. Aortic valve regurgitation is not visualized. No aortic stenosis is present. Pulmonic Valve: The pulmonic valve was normal in structure. Pulmonic valve regurgitation is not visualized. No evidence of pulmonic stenosis. Aorta: The aortic root and ascending aorta are structurally normal, with no evidence of dilitation. IAS/Shunts: The atrial septum is grossly normal.  LEFT VENTRICLE PLAX 2D LVIDd:         3.80 cm  Diastology LVIDs:         2.40 cm  LV e' lateral:   9.25 cm/s LV PW:         1.10 cm  LV E/e' lateral: 8.8 LV IVS:        1.40 cm  LV e' medial:    8.49 cm/s LVOT diam:     2.00 cm  LV E/e' medial:  9.6 LV SV:         87 LV SV Index:   44 LVOT Area:     3.14 cm  RIGHT VENTRICLE            IVC TAPSE (M-mode): 2.0 cm     IVC diam: 1.40 cm RVSP:           24.3 mmHg LEFT ATRIUM  Index       RIGHT ATRIUM           Index LA diam:        3.10 cm 1.58 cm/m  RA Pressure: 3.00 mmHg LA Vol (A2C):   51.8 ml 26.36 ml/m RA  Area:     8.25 cm LA Vol (A4C):   48.3 ml 24.57 ml/m RA Volume:   16.70 ml  8.50 ml/m LA Biplane Vol: 52.0 ml 26.46 ml/m  AORTIC VALVE LVOT Vmax:   123.00 cm/s LVOT Vmean:  83.300 cm/s LVOT VTI:    0.277 m  AORTA Ao Root diam: 3.30 cm Ao Asc diam:  3.40 cm MV E velocity: 81.20 cm/s  TRICUSPID VALVE MV A velocity: 85.20 cm/s  TR Peak grad:   21.3 mmHg MV E/A ratio:  0.95        TR Vmax:        231.00 cm/s                            Estimated RAP:  3.00 mmHg                            RVSP:           24.3 mmHg                             SHUNTS                            Systemic VTI:  0.28 m                            Systemic Diam: 2.00 cm Mertie Moores MD Electronically signed by Mertie Moores MD Signature Date/Time: 06/05/2019/4:43:41 PM    Final    XR Elbow 2 Views Left  Result Date: 06/26/2019 2 views of the left elbow show a healing radial neck fracture with minimal displacement.  XR Elbow 2 Views Left  Result Date: 06/01/2019 2 views left elbow show a nondisplaced radial neck fracture.  The elbow is otherwise well located.  XR Wrist 2 Views Left  Result Date: 06/01/2019 2 views of the left wrist show no fracture or dislocation.  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  Disposition   Pt is being discharged home today in good condition.  Follow-up Plans & Appointments    Follow-up Information     [name], PA-C Follow up on 07/21/2019.   Specialties: Cardiology, Radiology Why: at 1:45pm for your follow up appt. Contact information: [address] [place] [address] [phone]           Discharge Instructions     Amb Referral to Cardiac Rehabilitation   Complete by: As directed    Diagnosis: Coronary Stents   After initial evaluation and assessments completed: Virtual Based Care may be provided alone or in conjunction with Phase 2 Cardiac Rehab based on patient barriers.: Yes   Call MD for:  redness, tenderness, or signs of  infection (pain, swelling, redness, odor or green/yellow discharge around incision site)   Complete by: As directed    Diet - low sodium heart healthy   Complete by: As directed    Discharge instructions   Complete by: As  directed    Radial Site Care Refer to this sheet in the next few weeks. These instructions provide you with information on caring for yourself after your procedure. Your caregiver may also give you more specific instructions. Your treatment has been planned according to current medical practices, but problems sometimes occur. Call your caregiver if you have any problems or questions after your procedure. HOME CARE INSTRUCTIONS You may shower the day after the procedure. Remove the bandage (dressing) and gently wash the site with plain soap and water. Gently pat the site dry.  Do not apply powder or lotion to the site.  Do not submerge the affected site in water for 3 to 5 days.  Inspect the site at least twice daily.  Do not flex or bend the affected arm for 24 hours.  No lifting over 5 pounds (2.3 kg) for 5 days after your procedure.  Do not drive home if you are discharged the same day of the procedure. Have someone else drive you.  You may drive 24 hours after the procedure unless otherwise instructed by your caregiver.  What to expect: Any bruising will usually fade within 1 to 2 weeks.  Blood that collects in the tissue (hematoma) may be painful to the touch. It should usually decrease in size and tenderness within 1 to 2 weeks.  SEEK IMMEDIATE MEDICAL CARE IF: You have unusual pain at the radial site.  You have redness, warmth, swelling, or pain at the radial site.  You have drainage (other than a small amount of blood on the dressing).  You have chills.  You have a fever or persistent symptoms for more than 72 hours.  You have a fever and your symptoms suddenly get worse.  Your arm becomes pale, cool, tingly, or numb.  You have heavy bleeding from the site. Hold  pressure on the site.   PLEASE DO NOT MISS ANY DOSES OF YOUR PLAVIX!!!!! Also keep a log of you blood pressures and bring back to your follow up appt. Please call the office with any questions.   Patients taking blood thinners should generally stay away from medicines like ibuprofen, Advil, Motrin, naproxen, and Aleve due to risk of stomach bleeding. You may take Tylenol as directed or talk to your primary doctor about alternatives.   Increase activity slowly   Complete by: As directed       Discharge Medications     Medication List     STOP taking these medications    ibuprofen 200 MG tablet Commonly known as: ADVIL       TAKE these medications    albuterol 108 (90 Base) MCG/ACT inhaler Commonly known as: VENTOLIN HFA Inhale 2 puffs into the lungs every 6 (six) hours as needed for wheezing or shortness of breath.   anastrozole 1 MG tablet Commonly known as: ARIMIDEX Take 1 tablet (1 mg total) by mouth daily.   aspirin EC 81 MG tablet Take 81 mg by mouth daily.   clopidogrel 75 MG tablet Commonly known as: PLAVIX Take 1 tablet (75 mg total) by mouth daily. Start taking on: July 01, 2019   ergocalciferol 1.25 MG (50000 UT) capsule Commonly known as: VITAMIN D2 Take 50,000 Units by mouth once a week.   levothyroxine 137 MCG tablet Commonly known as: SYNTHROID Take 137 mcg by mouth at bedtime.   losartan-hydrochlorothiazide 50-12.5 MG tablet Commonly known as: HYZAAR Take 1 tablet by mouth daily.   multivitamin with minerals Tabs tablet Take 1 tablet by  mouth daily.   nitroGLYCERIN 0.4 MG SL tablet Commonly known as: NITROSTAT Place 1 tablet (0.4 mg total) under the tongue every 5 (five) minutes as needed for chest pain.   ondansetron 4 MG tablet Commonly known as: ZOFRAN Take 1 tablet (4 mg total) by mouth every 6 (six) hours as needed for nausea or vomiting.   rosuvastatin 10 MG tablet Commonly known as: CRESTOR Take 1 tablet (10 mg total) by mouth  daily.   venlafaxine XR 150 MG 24 hr capsule Commonly known as: Effexor XR Take 1 capsule (150 mg total) by mouth daily with breakfast.        No                               Did the patient have a percutaneous coronary intervention (stent / angioplasty)?:  Yes.     Cath/PCI Registry Performance & Quality Measures: Aspirin prescribed? - Yes ADP Receptor Inhibitor (Plavix/Clopidogrel, Brilinta/Ticagrelor or Effient/Prasugrel) prescribed (includes medically managed patients)? - Yes High Intensity Statin (Lipitor 40-80mg  or Crestor 20-40mg ) prescribed? - No - recently started on Crestor 10mg  daily, consider increasing at follow up  if continues to tolerate For EF <40%, was ACEI/ARB prescribed? - Yes For EF <40%, Aldosterone Antagonist (Spironolactone or Eplerenone) prescribed? - Not Applicable (EF >/= [id number]) Cardiac Rehab Phase II ordered (Included Medically managed Patients)? - Yes   Outstanding Labs/Studies   FLP/LFTs in 6 weeks.  Duration of Discharge Encounter   Greater than 30 minutes including physician time.  Signed, Reino Bellis NP-C 06/30/2019, 11:02 AM  Agree with note by Reino Bellis NP-C  Ms. Adel had a successful outpatient LAD intervention yesterday by Dr. Angelena Form for chest pain and positive CT FFR.  She does have a history of breast cancer and has had chest irradiation shortness of breath thought to be related to radiation-induced pulmonary fibrosis.  She is on dual antiplatelet therapy.  Her wrist is stable.  Her exam is benign.  She says she is breathing better today which makes me think that her shortness of breath may in part have been caused by myocardial ischemia.  She will be discharged home today .    Lorretta Harp, M.D., [place], [facility], [place] [address] [place], [place]  [address]  [phone] 06/30/2019 11:07 AM

## 2019-06-30 NOTE — Progress Notes (Signed)
Pt discharged home with husband.

## 2019-06-30 NOTE — Progress Notes (Signed)
Progress Note  Patient Name: Bethany [name] Date of Encounter: 06/30/2019  Primary Cardiologist: Dr. Darlina Guys  Subjective   Postop day #1 proximal to mid LAD stenting.  Right radial approach.  She has no other significant CAD and normal LV function.  She is done well overnight.  Plan discharge home today on aspirin Plavix.  Inpatient Medications    Scheduled Meds: . anastrozole  1 mg Oral Daily  . aspirin EC  81 mg Oral Daily  . clopidogrel  75 mg Oral Daily  . losartan  50 mg Oral Daily   And  . hydrochlorothiazide  12.5 mg Oral Daily  . levothyroxine  137 mcg Oral QHS  . rosuvastatin  10 mg Oral QHS  . sodium chloride flush  3 mL Intravenous Q12H  . venlafaxine XR  150 mg Oral QHS  . [START ON 07/05/2019] Vitamin D (Ergocalciferol)  50,000 Units Oral Q Wed   Continuous Infusions: . sodium chloride    . sodium chloride     PRN Meds: sodium chloride, acetaminophen, albuterol, nitroGLYCERIN, ondansetron (ZOFRAN) IV, ondansetron, sodium chloride flush   Vital Signs    Vitals:   06/29/19 1905 06/29/19 2324 06/30/19 0312 06/30/19 0720  BP: (!) 148/86 (!) 103/56 122/73 (!) 143/91  Pulse: 62 66 65 63  Resp: 14 15 14 20   Temp:  97.7 F (36.5 C) 98 F (36.7 C) 97.7 F (36.5 C)  TempSrc:  Oral Oral Oral  SpO2: 96% 96%  96%  Weight:      Height:       No intake or output data in the 24 hours ending 06/30/19 0857 Last 3 Weights 06/29/2019 06/22/2019 06/12/2019  Weight (lbs) 195 lb 200 lb 197 lb 3.2 oz  Weight (kg) 88.451 kg 90.719 kg 89.449 kg      Telemetry    Sinus rhythm- Personally Reviewed  ECG    Normal sinus rhythm at 65 without ST or T wave changes.- Personally Reviewed  Physical Exam   GEN: No acute distress.   Neck: No JVD Cardiac: RRR, no murmurs, rubs, or gallops.  Respiratory: Clear to auscultation bilaterally. GI: Soft, nontender, non-distended  MS: No edema; No deformity. Neuro:  Nonfocal  Psych: Normal affect   Labs    High  Sensitivity Troponin:  No results for input(s): TROPONINIHS in the last 720 hours.    Chemistry Recent Labs  Lab 06/30/19 0555  NA 140  K 3.5  CL 104  CO2 26  GLUCOSE 96  BUN 13  CREATININE 0.83  CALCIUM 8.5*  GFRNONAA >60  GFRAA >60  ANIONGAP 10     Hematology Recent Labs  Lab 06/30/19 0555  WBC 5.7  RBC 4.02  HGB 12.1  HCT 37.6  MCV 93.5  MCH 30.1  MCHC 32.2  RDW 13.1  PLT 277    BNPNo results for input(s): BNP, PROBNP in the last 168 hours.   DDimer No results for input(s): DDIMER in the last 168 hours.   Radiology    CARDIAC CATHETERIZATION  Result Date: 06/29/2019  Mid LAD lesion is 80% stenosed.  A drug-eluting stent was successfully placed using a STENT RESOLUTE ONYX 3.0X18.  Post intervention, there is a 0% residual stenosis.  1. Severe stenosis mid LAD 2. Successful PTCA/DES x 1 mid LAD Recommendations: 3 months of DAPT with ASA and Plavix and then will stop for her breast surgery.    Cardiac Studies   Cardiac catheterization (06/30/2019)  Conclusion    Mid  LAD lesion is 80% stenosed.  A drug-eluting stent was successfully placed using a STENT RESOLUTE ONYX 3.0X18.  Post intervention, there is a 0% residual stenosis.   1. Severe stenosis mid LAD 2. Successful PTCA/DES x 1 mid LAD  Recommendations: 3 months of DAPT with ASA and Plavix and then will stop for her breast surgery.    Coronary Diagrams  Diagnostic Dominance: Right  Intervention   Implants     Patient Profile     Bethany [name] with breast cancer status post chest radiation who had chest pain and shortness of breath and a positive coronary CTA/FFR suggesting proximal to mid LAD disease.  She was admitted yesterday for same day cardiac cath which Dr. Angelena Form performed radially revealing a 75% smooth proximal LAD lesion which was successfully stented.  She had no other significant CAD and normal LV function.  Assessment & Plan    1: Coronary artery disease-postop  day one proximal LAD PCI and drug-eluting stenting by Dr. Angelena Form via the right radial approach for chest pain and positive CTA/FFR.  She has done well overnight.  She is on aspirin Plavix.  Her wrist is stable.  She is stable for discharge.  2: Essential hypertension-stable on current medications.  Postop day one proximal to mid LAD PCI and stenting by Dr. Angelena Form.  Patient stable for discharge on aspirin Plavix.  We will arrange follow-up with him in 2 to 3 weeks.  For questions or updates, please contact [name] Please consult [email or web address] for contact info under        Signed, Quay Burow, MD  06/30/2019, 8:57 AM

## 2019-06-30 NOTE — Discharge Instructions (Signed)
Radial Site Care  This sheet gives you information about how to care for yourself after your procedure. Your health care provider may also give you more specific instructions. If you have problems or questions, contact your health care provider. What can I expect after the procedure? After the procedure, it is common to have:  Bruising and tenderness at the catheter insertion area. Follow these instructions at home: Medicines  Take over-the-counter and prescription medicines only as told by your health care provider. Insertion site care  Follow instructions from your health care provider about how to take care of your insertion site. Make sure you: ? Wash your hands with soap and water before you change your bandage (dressing). If soap and water are not available, use hand sanitizer. ? Change your dressing as told by your health care provider. ? Leave stitches (sutures), skin glue, or adhesive strips in place. These skin closures may need to stay in place for 2 weeks or longer. If adhesive strip edges start to loosen and curl up, you may trim the loose edges. Do not remove adhesive strips completely unless your health care provider tells you to do that.  Check your insertion site every day for signs of infection. Check for: ? Redness, swelling, or pain. ? Fluid or blood. ? Pus or a bad smell. ? Warmth.  Do not take baths, swim, or use a hot tub until your health care provider approves.  You may shower 24-48 hours after the procedure, or as directed by your health care provider. ? Remove the dressing and gently wash the site with plain soap and water. ? Pat the area dry with a clean towel. ? Do not rub the site. That could cause bleeding.  Do not apply powder or lotion to the site. Activity   For 24 hours after the procedure, or as directed by your health care provider: ? Do not flex or bend the affected arm. ? Do not push or pull heavy objects with the affected arm. ? Do not  drive yourself home from the hospital or clinic. You may drive 24 hours after the procedure unless your health care provider tells you not to. ? Do not operate machinery or power tools.  Do not lift anything that is heavier than 10 lb (4.5 kg), or the limit that you are told, until your health care provider says that it is safe.  Ask your health care provider when it is okay to: ? Return to work or school. ? Resume usual physical activities or sports. ? Resume sexual activity. General instructions  If the catheter site starts to bleed, raise your arm and put firm pressure on the site. If the bleeding does not stop, get help right away. This is a medical emergency.  If you went home on the same day as your procedure, a responsible adult should be with you for the first 24 hours after you arrive home.  Keep all follow-up visits as told by your health care provider. This is important. Contact a health care provider if:  You have a fever.  You have redness, swelling, or yellow drainage around your insertion site. Get help right away if:  You have unusual pain at the radial site.  The catheter insertion area swells very fast.  The insertion area is bleeding, and the bleeding does not stop when you hold steady pressure on the area.  Your arm or hand becomes pale, cool, tingly, or numb. These symptoms may represent a serious problem   that is an emergency. Do not wait to see if the symptoms will go away. Get medical help right away. Call your local emergency services (911 in the [place] Do not drive yourself to the hospital. Summary  After the procedure, it is common to have bruising and tenderness at the site.  Follow instructions from your health care provider about how to take care of your radial site wound. Check the wound every day for signs of infection.  Do not lift anything that is heavier than 10 lb (4.5 kg), or the limit that you are told, until your health care provider says  that it is safe. This information is not intended to replace advice given to you by your health care provider. Make sure you discuss any questions you have with your health care provider. Document Revised: 04/07/2017 Document Reviewed: 04/07/2017 Elsevier Patient Education  2020 Elsevier Inc.  

## 2019-06-30 NOTE — TOC Progression Note (Signed)
Transition of Care [facility]) - Progression Note    Patient Details  Name: Bethany [name] MRN: [id number] Date of Birth: [birth year]  [facility]) CM/SW Contact  Zenon Mayo, RN Phone Number: 06/30/2019, 9:42 AM  Clinical Narrative:    NCM received consult for plavix, this already has a generic and is at affordable price, patient has insurance, TOC can fill for patient.        Expected Discharge Plan and Services                                                 Social Determinants of Health (SDOH) Interventions    Readmission Risk Interventions No flowsheet data found.

## 2019-07-03 ENCOUNTER — Telehealth (HOSPITAL_COMMUNITY): Payer: Self-pay

## 2019-07-03 NOTE — Telephone Encounter (Signed)
Attempted to call patient in regards to Cardiac Rehab - unable to leave VM, VM full.

## 2019-07-03 NOTE — Telephone Encounter (Signed)
Pt insurance is active and benefits verified through [facility]. Co-pay $0.00, DED $3,000.00/$3,000.00 met, out of pocket $6,850.00/$6,206.75 met, co-insurance 20%. No pre-authorization required. Passport, 07/03/19 @ 11:21AM, TWK#[id number]  Will contact patient to see if she is interested in the Cardiac Rehab Program. If interested, patient will need to complete follow up appt. Once completed, patient will be contacted for scheduling upon review by the RN Navigator.

## 2019-07-04 ENCOUNTER — Other Ambulatory Visit: Payer: Self-pay | Admitting: Oncology

## 2019-07-05 ENCOUNTER — Ambulatory Visit: Payer: 59 | Attending: Internal Medicine

## 2019-07-05 DIAGNOSIS — Z23 Encounter for immunization: Secondary | ICD-10-CM

## 2019-07-05 NOTE — Progress Notes (Signed)
   Covid-19 Vaccination Clinic  Name:  CATHAY LIPE    MRN: [id number] DOB: 26-Aug-1960  07/05/2019  Ms. Poncedeleon was observed post Covid-19 immunization for 15 minutes without incident. She was provided with Vaccine Information Sheet and instruction to access the V-Safe system.   Ms. Gaier was instructed to call 911 with any severe reactions post vaccine: Marland Kitchen Difficulty breathing  . Swelling of face and throat  . A fast heartbeat  . A bad rash all over body  . Dizziness and weakness   Immunizations Administered    Name Date Dose VIS Date Route   Pfizer COVID-19 Vaccine 07/05/2019  3:51 PM 0.3 mL 05/10/2018 Intramuscular   Manufacturer: [employer], [employer]   Lot: [id number]   [place]: [id number]

## 2019-07-10 ENCOUNTER — Encounter: Payer: Self-pay | Admitting: Cardiovascular Disease

## 2019-07-19 ENCOUNTER — Encounter: Payer: Self-pay | Admitting: Physician Assistant

## 2019-07-19 NOTE — Progress Notes (Signed)
Cardiology Office Note    Date:  07/21/2019   ID:  Bethany [name], DOB Jan 31, 1961, MRN [id number]  PCP:  Leeroy Cha, MD  Cardiologist:  [name], MD  Electrophysiologist:  None   Chief Complaint: post cath f/u  History of Present Illness:   Bethany [name] is a 59 y.o. female with history of CAD s/p DES to LAD 06/2019, breast cancer in 2019 s/p radiation therapy, hiatal hernia, thyroid cancer, hypothyroidism, HTN, former tobacco abuse and anxiety who presents for post-cath follow-up. She was seen as a new patient by Dr. Angelena Form for evaluation of chest pain 05/15/19. She had had EGD per Dr. Henrene Pastor 04/12/19 with no cause for chest pain isolated. She is known to have radiation induced lung fibrosis. She underwent gated cardiac CTA on 06/20/19 and this showed a LAD stenosis that appeared to be flow limiting. Echo 06/05/19 showed normal LV systolic function, EF 123456, no valve disease, mild asymmetric LVH, mildly elevated PASP. No evidence of MVP previously mentioned in [place]. She previously had breast cancer in the left breast that was treated, and more recently on the right. She has been undergoing radiation therapy with plans for lumpectomy. Due to cardiac finding, this was deferred. She underwent LHC 06/29/19 showing 80% mLAD lesion, treated with PTCA/DES. It was recommended that she could continue 3 months of DAPT with ASA and Plavix and then will stop for her breast surgery. Last labs personally reviewed 06/2019 Hgb 12.1, K 3.5, Cr 0.83, LFTs OK, 01/2019 TSH wnl, no recent lipids on file - 2016 LDL 122.  She is seen back for follow-up today and doing well. She has not had any more chest pain. She continues to have chronic dyspnea likely related to her known lung fibrosis but in general she has felt much better. She inquires about use of diet pill phentermine. She is tolerating Crestor well.   Past Medical History:  Diagnosis Date  . Anxiety   . Breast cancer ([place])    left  breast cancer/diagnosed in 10/2017/taking radiation treatment until 03/15/18  . Breast mass, left 11/2017   going thru radiation until 02/2018  . CAD (coronary artery disease)    a. DES to LAD 06/2019.  Marland Kitchen Chest pain 06/19/2014  . COPD (chronic obstructive pulmonary disease) ([place])    beginning stages/small scar  . Dental crowns present   . Hiatal hernia   . History of hiatal hernia    no current med.  Marland Kitchen History of thyroid cancer 12/15/2017  . Hypertension    states under control with med., has been on med. x 1 yr.  . Hypothyroidism   . Malignant neoplasm of upper-outer quadrant of left breast in female, estrogen receptor positive ([place])   . Mild hyperlipidemia   . 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 MVP (mitral valve prolapse)   . Personal history of radiation therapy   . PONV (postoperative nausea and vomiting)   . Radiation fibrosis of lung ([place])   . Syncope and collapse 12/25/2008   Qualifier: Diagnosis of  By: Philemon Kingdom    . Urinary incontinence    USI   . Vitamin D deficiency     Past Surgical History:  Procedure Laterality Date  . ABDOMINAL HYSTERECTOMY     partial  . APPENDECTOMY    . AXILLARY SENTINEL NODE BIOPSY Left 12/09/2017   Procedure: AXILLARY SENTINEL NODE BIOPSY;  Surgeon: Erroll Luna, MD;  Location: [facility];  Service: General;  Laterality: Left;  .  BREAST LUMPECTOMY Left 11/2017  . BREAST LUMPECTOMY WITH NEEDLE LOCALIZATION Left 11/25/2017   Procedure: LEFT BREAST LUMPECTOMY WITH NEEDLE LOCALIZATION;  Surgeon: Erroll Luna, MD;  Location: [facility];  Service: General;  Laterality: Left;  . COLONOSCOPY WITH PROPOFOL  10/03/2015  . CORONARY STENT INTERVENTION N/A 06/29/2019   Procedure: CORONARY STENT INTERVENTION;  Surgeon: [place] Blanks, MD;  Location: [facility];  Service: Cardiovascular;  Laterality: N/A;  . LAPAROSCOPIC APPENDECTOMY N/A 10/03/2015   Procedure: APPENDECTOMY LAPAROSCOPIC;  Surgeon:  Rolm Bookbinder, MD;  Location: [facility];  Service: General;  Laterality: N/A;  . LEFT HEART CATH AND CORONARY ANGIOGRAPHY N/A 06/29/2019   Procedure: LEFT HEART CATH AND CORONARY ANGIOGRAPHY;  Surgeon: Burn[place] Blanks, MD;  Location: [facility];  Service: Cardiovascular;  Laterality: N/A;  . TOTAL THYROIDECTOMY  04/02/2003    Current Medications: Current Meds  Medication Sig  . albuterol (VENTOLIN HFA) 108 (90 Base) MCG/ACT inhaler Inhale 2 puffs into the lungs every 6 (six) hours as needed for wheezing or shortness of breath.  . anastrozole (ARIMIDEX) 1 MG tablet Take 1 tablet (1 mg total) by mouth daily.  Marland Kitchen aspirin EC 81 MG tablet Take 81 mg by mouth daily.  . clopidogrel (PLAVIX) 75 MG tablet Take 1 tablet (75 mg total) by mouth daily.  . ergocalciferol (VITAMIN D2) 50000 UNITS capsule Take 50,000 Units by mouth once a week.   . levothyroxine (SYNTHROID, LEVOTHROID) 137 MCG tablet Take 137 mcg by mouth at bedtime.   Marland Kitchen losartan-hydrochlorothiazide (HYZAAR) 50-12.5 MG tablet Take 1 tablet by mouth daily.  . Multiple Vitamin (MULTIVITAMIN WITH MINERALS) TABS tablet Take 1 tablet by mouth daily.  . nitroGLYCERIN (NITROSTAT) 0.4 MG SL tablet Place 1 tablet (0.4 mg total) under the tongue every 5 (five) minutes as needed for chest pain.  Marland Kitchen ondansetron (ZOFRAN) 4 MG tablet Take 1 tablet (4 mg total) by mouth every 6 (six) hours as needed for nausea or vomiting.  . rosuvastatin (CRESTOR) 20 MG tablet Take 1 tablet (20 mg total) by mouth daily.  Marland Kitchen venlafaxine XR (EFFEXOR-XR) 150 MG 24 hr capsule TAKE 1 CAPSULE (150 MG) BY MOUTH ONCE A DAY WITH BREAKFAST  . rosuvastatin (CRESTOR) 10 MG tablet Take 1 tablet (10 mg total) by mouth daily.      Allergies:   Oxycodone, Latex, and Sulfa antibiotics   Social History   Socioeconomic History  . Marital status: Married    Spouse name: Not on file  . Number of children: 3  . Years of education: Not on file  . Highest education level:  Not on file  Occupational History  . Occupation: Self employed  Tobacco Use  . Smoking status: Former Smoker    Quit date: 08/14/2014    Years since quitting: 4.9  . Smokeless tobacco: Never Used  Substance and Sexual Activity  . Alcohol use: Yes    Comment: rarely  . Drug use: No  . Sexual activity: Yes    Partners: Male  Other Topics Concern  . Not on file  Social History Narrative  . Not on file   Social Determinants of Health   Financial Resource Strain:   . Difficulty of Paying Living Expenses:   Food Insecurity:   . Worried About Charity fundraiser in the Last Year:   . Arboriculturist in the Last Year:   Transportation Needs:   . Film/video editor (Medical):   Marland Kitchen Lack of  Transportation (Non-Medical):   Physical Activity:   . Days of Exercise per Week:   . Minutes of Exercise per Session:   Stress:   . Feeling of Stress :   Social Connections:   . Frequency of Communication with Friends and Family:   . Frequency of Social Gatherings with Friends and Family:   . Attends Religious Services:   . Active Member of Clubs or Organizations:   . Attends Archivist Meetings:   Marland Kitchen Marital Status:      Family History:  The patient's family history includes Colon cancer in an other family member; Colon polyps in her mother; Heart disease in her father and paternal grandfather; Hypertension in her brother and father; Non-Hodgkin's lymphoma in her father. There is no history of Stomach cancer, Rectal cancer, or Esophageal cancer.  ROS:   Please see the history of present illness.  All other systems are reviewed and otherwise negative.    EKGs/Labs/Other Studies Reviewed:    Studies reviewed are outlined and summarized above. Reports included below if pertinent.  Coronary CTA 06/2019 CLINICAL DATA:  Chest pain  EXAM: Cardiac CTA  MEDICATIONS: Sub lingual nitro.  4mg   TECHNIQUE: The patient was scanned on a Siemens Force [id number] slice scanner. Gantry  rotation speed was 250 msecs. Collimation was .6 mm. A 100 kV prospective scan was triggered in the ascending thoracic aorta at 140 HU's Full mA was used between 35% and 75% of the R-R interval. Average HR during the scan was 51 bpm. The 3D data set was interpreted on a dedicated work station using MPR, MIP and VRT modes. A total of 80cc of contrast was used.  FINDINGS: Non-cardiac: See separate report from [facility]. No significant findings on limited lung and soft tissue windows.  Calcium Score: Calcium noted in proximal and mid LAD and RCA  Coronary Arteries: Right dominant with no anomalies  LM: Normal  LAD: 25-49% calcified plaque in proximal vessel > 70% mixed plaque in mid LAD  D1: Small vessel  D2: Small vessel  Circumflex: Normal  OM1: Normal  OM2: Normal  RCA: 1-24% calcified plaque in proximal vessel distal vessel is tortuous  PDA: Normal  PLA: Normal  IMPRESSION: 1.  Calcium score 263 which is 98 th percentile for age/sex  2.   Normal aortic root 3.6 cm  3. CAD-RADS 4 likely > 70% hemodynamically significant stenosis in mid LAD Study sent for FFR CT  Jenkins Rouge   Electronically Signed   By: Jenkins Rouge M.D.   On: 06/20/2019 17:53  CLINICAL DATA:  CAD  EXAM: FFR CT  TECHNIQUE: The best systolic and diastolic phases of the patients gated cardiac CTA sent for hemodynamic analysis  FINDINGS: FFR CT positive in mid LAD 0.79 and 0.76 in distal LAD  IMPRESSION: FFR CT suggesting hemodynamically significant CAD in mid LAD Patient will be referred for heart catheterization  Jenkins Rouge   Electronically Signed   By: Jenkins Rouge M.D.   On: 06/21/2019 09:33  Do not see overread.  2D Echo 06/05/19 IMPRESSIONS    1. Left ventricular ejection fraction, by estimation, is 60 to 65%. The  left ventricle has normal function. The left ventricle has no regional  wall motion abnormalities. There is  mild asymmetric left ventricular  hypertrophy. Left ventricular diastolic  parameters were normal.  2. Right ventricular systolic function is normal. The right ventricular  size is normal. There is mildly elevated pulmonary artery systolic  pressure.  3. The  mitral valve is normal in structure. No evidence of mitral valve  regurgitation. No evidence of mitral stenosis.  4. The aortic valve is normal in structure. Aortic valve regurgitation is  not visualized. No aortic stenosis is present.   LHC 06/29/19  Mid LAD lesion is 80% stenosed.  A drug-eluting stent was successfully placed using a STENT RESOLUTE ONYX 3.0X18.  Post intervention, there is a 0% residual stenosis.   1. Severe stenosis mid LAD 2. Successful PTCA/DES x 1 mid LAD  Recommendations: 3 months of DAPT with ASA and Plavix and then will stop for her breast surgery.      EKG:  EKG is ordered today, personally reviewed, demonstrating NSR 71bpm no acute STT changes.  Recent Labs: 02/07/2019: TSH 1.91 06/12/2019: ALT 19 06/30/2019: BUN 13; Creatinine, Ser 0.83; Hemoglobin 12.1; Platelets 277; Potassium 3.5; Sodium 140  Recent Lipid Panel    Component Value Date/Time   CHOL 192 06/19/2014 2234   TRIG 90 06/19/2014 2234   HDL 52 06/19/2014 2234   CHOLHDL 3.7 06/19/2014 2234   VLDL 18 06/19/2014 2234   LDLCALC 122 (H) 06/19/2014 2234    PHYSICAL EXAM:    VS:  BP 130/90   Pulse 71   Ht 5\' 6"  (1.676 m)   Wt 198 lb (89.8 kg)   SpO2 94%   BMI 31.96 kg/m   BMI: Body mass index is 31.96 kg/m.  GEN: Well nourished, well developed WF, in no acute distress HEENT: normocephalic, atraumatic Neck: no JVD, carotid bruits, or masses Cardiac: RRR; no murmurs, rubs, or gallops, no edema  Respiratory:  clear to auscultation bilaterally, normal work of breathing GI: soft, nontender, nondistended, + BS MS: no deformity or atrophy Skin: warm and dry, no rash, right radial cath site without hematoma or ecchymosis; good  pulse. Neuro:  Alert and Oriented x 3, Strength and sensation are intact, follows commands Psych: euthymic mood, full affect  Wt Readings from Last 3 Encounters:  07/21/19 198 lb (89.8 kg)  06/29/19 195 lb (88.5 kg)  06/22/19 200 lb (90.7 kg)     ASSESSMENT & PLAN:   1. CAD, also being managed in the context of breast cancer - clinically doing well. EKG is normal. Residual dyspnea is likely due to her chronic lung fibrosis. Continue ASA, Plavix, and titrate statin as below. Tentatively it looks like her breast surgery is scheduled for 09/20/19. However, her PCI was 06/29/19 with plans to continue DAPT for 3 months then hold. She'll likely require a 5-7 day hold depending on surgeon preference which would put her around 7/22. I will reach out to Dr. Angelena Form for his input and then we will relay to surgeon's office. 2. Essential HTN - BP upper limits of normal. SBP was previously normal. She was instructed to monitor BP daily over the next several days and call/send in readings on Tuesday for our review. If BP remains elevated, would consider titration of her Hyzaar to 100/12.5.g daily. 3. Hyperlipidemia goal LDL <70 - given confirmation of CAD, guidelines indicate she should be on at least moderate-high intensity statin dosing. Will increase Crestor to 20mg  daily with f/u CMET/lipids in 6 weeks. If LDL is >70, would increase further to 40mg  daily. 4. Obesity - she request input on diet pill phentermine. This is contraindicated in patients with cardiac disease therefore I advised against this. She has struggled with weight in the context of not having a thyroid. She is on supplementation. She also reports h/o insulin resistance. She would  be a great candidate for the [facility] [place] program to device a plan that takes into account these conditions. She would like a referral which we placed.  Disposition: F/u with Dr. Angelena Form in 3 months (August 2021) in person.  Medication  Adjustments/Labs and Tests Ordered: Current medicines are reviewed at length with the patient today.  Concerns regarding medicines are outlined above. Medication changes, Labs and Tests ordered today are summarized above and listed in the Patient Instructions accessible in Encounters.   Signed, [name], PA-C  07/21/2019 2:26 PM    [facility] [address], [place], [place]  [id number] Phone: [phone]; Fax: [phone]

## 2019-07-21 ENCOUNTER — Encounter: Payer: Self-pay | Admitting: Physician Assistant

## 2019-07-21 ENCOUNTER — Ambulatory Visit (INDEPENDENT_AMBULATORY_CARE_PROVIDER_SITE_OTHER): Payer: 59 | Admitting: Physician Assistant

## 2019-07-21 ENCOUNTER — Other Ambulatory Visit: Payer: Self-pay

## 2019-07-21 VITALS — BP 130/90 | HR 71 | Ht 66.0 in | Wt 198.0 lb

## 2019-07-21 DIAGNOSIS — I10 Essential (primary) hypertension: Secondary | ICD-10-CM | POA: Diagnosis not present

## 2019-07-21 DIAGNOSIS — Z853 Personal history of malignant neoplasm of breast: Secondary | ICD-10-CM

## 2019-07-21 DIAGNOSIS — E669 Obesity, unspecified: Secondary | ICD-10-CM | POA: Diagnosis not present

## 2019-07-21 DIAGNOSIS — E785 Hyperlipidemia, unspecified: Secondary | ICD-10-CM | POA: Diagnosis not present

## 2019-07-21 DIAGNOSIS — I2510 Atherosclerotic heart disease of native coronary artery without angina pectoris: Secondary | ICD-10-CM | POA: Diagnosis not present

## 2019-07-21 MED ORDER — ROSUVASTATIN CALCIUM 20 MG PO TABS: 20.0000 mg | ORAL_TABLET | Freq: Every day | ORAL | 3 refills | Status: DC

## 2019-07-21 NOTE — Patient Instructions (Addendum)
Medication Instructions:  Your physician has recommended you make the following change in your medication:   INCREASE: rosuvastatin (crestor) to 20 mg once a day--You may take 2 tablets once a day of your current 10 mg tablets, when you pick up your new prescription it will be for a 20 mg tablet. You will only need to take 1 tablet once a day of these.  *If you need a refill on your cardiac medications before your next appointment, please call your pharmacy*   Lab Work: Your physician recommends that you return for a FASTING lipid profile and complete metabolic panel on 0000000  If you have labs (blood work) drawn today and your tests are completely normal, you will receive your results only by: Marland Kitchen MyChart Message (if you have MyChart) OR . A paper copy in the mail If you have any lab test that is abnormal or we need to change your treatment, we will call you to review the results.   Testing/Procedures: None ordered   Follow-Up: You have been referred to the [facility] [place]  Follow up with Dr. Angelena Form on 10/30/19 at 2:40 PM    Other Instructions  I would recommend using a blood pressure cuff that goes on your arm. The wrist ones can be inaccurate. If possible, try to select one that also reports your heart rate. To check your blood pressure, choose a time at least 3 hours after taking your blood pressure medicines. If you can sample it at different times of the day, that's great - it might give you more information about how your blood pressure fluctuates. Remain seated in a chair for 5 minutes quietly beforehand, then check it. Please record a list of those readings and call us/send in MyChart message with them for our review on Tuesday.

## 2019-07-24 ENCOUNTER — Telehealth: Payer: Self-pay | Admitting: Physician Assistant

## 2019-07-24 ENCOUNTER — Ambulatory Visit: Payer: 59 | Admitting: Orthopaedic Surgery

## 2019-07-24 NOTE — Telephone Encounter (Signed)
No, to clarify she is on ASA + Plavix now because she had a stent placed less than a month ago. So when the surgeon wants her to begin holding her Plavix +/- aspirin 5-7 days prior to surgery (scheduled for 7/7), she has gotten the green light from [place]. After surgery she should restart aspirin only when OK with surgeon.

## 2019-07-24 NOTE — Telephone Encounter (Signed)
   Please let pt know I spoke with Dr. Angelena Form about surgery timing. Her lumpectomy is currently scheduled for 09/20/19 which is a little earlier than 3 months post PCI (had stent 06/29/19). This would mean she'd have to begin holding her blood thinners around 09/13/19 (final date at discretion of surgeon)  Dr. Angelena Form feels she is OK to keep surgery as planned and OK to hold beginning that date if needed. He recommends to put her on aspirin alone post surgery.  We typically leave it up to the surgeons whether they feel that they have to hold both blood thinners (ASA + Plavix or just Plavix alone). If able continue aspirin throughout, that's usually our default but otherwise she now has the clearance for the above. Can you please let surgeon Dr. Josetta Huddle office know as well? Thanks. Johanna Stafford PA-C

## 2019-07-24 NOTE — Telephone Encounter (Signed)
Call placed to pt re: message below.  Voicemail box was full, will try later.

## 2019-07-25 ENCOUNTER — Telehealth (HOSPITAL_COMMUNITY): Payer: Self-pay

## 2019-07-25 NOTE — Telephone Encounter (Signed)
Called and spoke with pt in regards to CR, pt stated she keeps her grandchildren. Also she stated she has an upcoming surgery and wouldn't want to stop in the middle of the program. She will call when she is ready to schedule.  Closed referral

## 2019-07-26 NOTE — Telephone Encounter (Signed)
2nd attempt to reach pt re: message below.  Voicemail is full and cannot leave a message.

## 2019-07-27 MED ORDER — CLOPIDOGREL BISULFATE 75 MG PO TABS: 75.0000 mg | ORAL_TABLET | Freq: Every day | ORAL | 0 refills | Status: DC

## 2019-07-27 NOTE — Telephone Encounter (Signed)
Pt sent mychart message re: surgery and medication directions along with her bp readings. I sent her this information back as correspondence.  Will route to Carlene Coria, CMA, with Dr. Brantley Stage, to make her aware as well.

## 2019-08-02 ENCOUNTER — Ambulatory Visit: Payer: 59 | Admitting: Orthopaedic Surgery

## 2019-08-09 ENCOUNTER — Ambulatory Visit: Payer: Self-pay

## 2019-08-09 ENCOUNTER — Other Ambulatory Visit: Payer: Self-pay

## 2019-08-09 ENCOUNTER — Encounter: Payer: Self-pay | Admitting: Orthopaedic Surgery

## 2019-08-09 ENCOUNTER — Ambulatory Visit (INDEPENDENT_AMBULATORY_CARE_PROVIDER_SITE_OTHER): Payer: 59 | Admitting: Orthopaedic Surgery

## 2019-08-09 DIAGNOSIS — M79642 Pain in left hand: Secondary | ICD-10-CM | POA: Diagnosis not present

## 2019-08-09 DIAGNOSIS — S52132A Displaced fracture of neck of left radius, initial encounter for closed fracture: Secondary | ICD-10-CM

## 2019-08-09 NOTE — Progress Notes (Signed)
The patient is now about 4 weeks status post an injury to her left elbow in which she sustained a nondisplaced radial neck fracture.  She does report some bruising in the elbow but does feel like it is getting better each day.  She has noticed a "knot" on her left hand she points to palmar aspect of the hand along the fourth ray at the ring finger.  Examination of her hand there is a palpable area that is almost consistent with a cord such as with Dupuytren's contracture.  The remainder of her hand exam is normal except for pain at the base of the thumb on the left and right side which she has had pre-existing.  There is no locking of the fingers.  Clicking or triggering.  Her hand is well-perfused.  Examination of her left elbow shows full range of motion left elbow with some swelling and bruising.  There is pain over the radial head.  2 views left elbow show that the fracture line still visible at the radial neck but the elbow is congruent and well located.  3 views of her left hand showed no acute findings.  From a hand standpoint, this may be evidence of a Dupuytren's contracture that she did not notice before.  We will just watch this conservatively.  From a elbow standpoint, she is not in any type of bracing but I want her to avoid heavy lifting with the elbow.  I would like to see her back in about 5 weeks with a repeat 2 views of her left elbow.  All questions and concerns were answered and addressed.

## 2019-08-10 ENCOUNTER — Encounter: Payer: 59 | Admitting: Obstetrics & Gynecology

## 2019-09-01 ENCOUNTER — Other Ambulatory Visit: Payer: 59

## 2019-09-03 ENCOUNTER — Encounter: Payer: Self-pay | Admitting: Emergency Medicine

## 2019-09-03 ENCOUNTER — Other Ambulatory Visit: Payer: Self-pay

## 2019-09-03 ENCOUNTER — Ambulatory Visit
Admission: EM | Admit: 2019-09-03 | Discharge: 2019-09-03 | Disposition: A | Discharge status: Home / Self Care | Payer: 59 | Attending: Emergency Medicine | Admitting: Emergency Medicine

## 2019-09-03 ENCOUNTER — Ambulatory Visit
Admission: RE | Admit: 2019-09-03 | Discharge: 2019-09-03 | Disposition: A | Discharge status: Home / Self Care | Payer: 59 | Source: Ambulatory Visit

## 2019-09-03 DIAGNOSIS — R05 Cough: Secondary | ICD-10-CM

## 2019-09-03 DIAGNOSIS — J0100 Acute maxillary sinusitis, unspecified: Secondary | ICD-10-CM | POA: Diagnosis present

## 2019-09-03 DIAGNOSIS — J069 Acute upper respiratory infection, unspecified: Secondary | ICD-10-CM | POA: Diagnosis not present

## 2019-09-03 LAB — POCT RAPID STREP A (OFFICE): Rapid Strep A Screen: NEGATIVE

## 2019-09-03 MED ORDER — CETIRIZINE HCL 10 MG PO TABS: 10.0000 mg | ORAL_TABLET | Freq: Every day | ORAL | 0 refills | Status: DC

## 2019-09-03 MED ORDER — AZITHROMYCIN 250 MG PO TABS: 250.0000 mg | ORAL_TABLET | Freq: Every day | ORAL | 0 refills | Status: DC

## 2019-09-03 MED ORDER — PREDNISONE 10 MG PO TABS: 20.0000 mg | ORAL_TABLET | Freq: Every day | ORAL | 0 refills | Status: DC

## 2019-09-03 NOTE — ED Provider Notes (Addendum)
[place]   [id number] 09/03/19 Arrival Time: 1506   Chief Complaint  Patient presents with  . Nasal Congestion     SUBJECTIVE: History from: patient.  Bethany [name] is a 59 y.o. female who presents to the urgent care for complaint of nasal congestion, cough, sinus pressure and pain and sore throat for the past 5 to 6 days.  Report her grandkids are having the same symptom.  Denies recent travel.  History OTC medication with no relief.  Symptoms are made worse with lying down.  Denies previous symptoms in the past.   Denies fever, chills, fatigue, rhinorrhea, SOB, wheezing, chest pain, nausea, changes in bowel or bladder habits.         Received flu shot this year: yes.  ROS: As per HPI.  All other pertinent ROS negative.     Past Medical History:  Diagnosis Date  . Anxiety   . Breast cancer ([place])    left breast cancer/diagnosed in 10/2017/taking radiation treatment until 03/15/18  . Breast mass, left 11/2017   going thru radiation until 02/2018  . CAD (coronary artery disease)    a. DES to LAD 06/2019.  Marland Kitchen Chest pain 06/19/2014  . COPD (chronic obstructive pulmonary disease) ([place])    beginning stages/small scar  . Dental crowns present   . Hiatal hernia   . History of hiatal hernia    no current med.  Marland Kitchen History of thyroid cancer 12/15/2017  . Hypertension    states under control with med., has been on med. x 1 yr.  . Hypothyroidism   . Malignant neoplasm of upper-outer quadrant of left breast in female, estrogen receptor positive ([place])   . Mild hyperlipidemia   . 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 MVP (mitral valve prolapse)   . Personal history of radiation therapy   . PONV (postoperative nausea and vomiting)   . 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 Syncope and collapse 12/25/2008   Qualifier: Diagnosis of  By: Philemon Kingdom    . Urinary incontinence    USI   . Vitamin D deficiency    Past Surgical History:  Procedure Laterality Date  .  ABDOMINAL HYSTERECTOMY     partial  . APPENDECTOMY    . AXILLARY SENTINEL NODE BIOPSY Left 12/09/2017   Procedure: AXILLARY SENTINEL NODE BIOPSY;  Surgeon: Erroll Luna, MD;  Location: [facility];  Service: General;  Laterality: Left;  . BREAST LUMPECTOMY Left 11/2017  . BREAST LUMPECTOMY WITH NEEDLE LOCALIZATION Left 11/25/2017   Procedure: LEFT BREAST LUMPECTOMY WITH NEEDLE LOCALIZATION;  Surgeon: Erroll Luna, MD;  Location: [facility];  Service: General;  Laterality: Left;  . COLONOSCOPY WITH PROPOFOL  10/03/2015  . CORONARY STENT INTERVENTION N/A 06/29/2019   Procedure: CORONARY STENT INTERVENTION;  Surgeon: Burnell Blanks, MD;  Location: [facility];  Service: Cardiovascular;  Laterality: N/A;  . LAPAROSCOPIC APPENDECTOMY N/A 10/03/2015   Procedure: APPENDECTOMY LAPAROSCOPIC;  Surgeon: Rolm Bookbinder, MD;  Location: [facility];  Service: General;  Laterality: N/A;  . LEFT HEART CATH AND CORONARY ANGIOGRAPHY N/A 06/29/2019   Procedure: LEFT HEART CATH AND CORONARY ANGIOGRAPHY;  Surgeon: Burnell Blanks, MD;  Location: [facility];  Service: Cardiovascular;  Laterality: N/A;  . TOTAL THYROIDECTOMY  04/02/2003   Allergies  Allergen Reactions  . Oxycodone Nausea And Vomiting  . Latex Rash  . Sulfa Antibiotics     hives   No current facility-administered medications on file  prior to encounter.   Current Outpatient Medications on File Prior to Encounter  Medication Sig Dispense Refill  . albuterol (VENTOLIN HFA) 108 (90 Base) MCG/ACT inhaler Inhale 2 puffs into the lungs every 6 (six) hours as needed for wheezing or shortness of breath. 18 g 6  . anastrozole (ARIMIDEX) 1 MG tablet Take 1 tablet (1 mg total) by mouth daily. 90 tablet 4  . aspirin EC 81 MG tablet Take 81 mg by mouth daily.    . clopidogrel (PLAVIX) 75 MG tablet Take 1 tablet (75 mg total) by mouth daily. 90 tablet 0  . ergocalciferol (VITAMIN D2) 50000 UNITS  capsule Take 50,000 Units by mouth once a week.     . levothyroxine (SYNTHROID, LEVOTHROID) 137 MCG tablet Take 137 mcg by mouth at bedtime.     Marland Kitchen losartan-hydrochlorothiazide (HYZAAR) 50-12.5 MG tablet Take 1 tablet by mouth daily.    . Multiple Vitamin (MULTIVITAMIN WITH MINERALS) TABS tablet Take 1 tablet by mouth daily.    . nitroGLYCERIN (NITROSTAT) 0.4 MG SL tablet Place 1 tablet (0.4 mg total) under the tongue every 5 (five) minutes as needed for chest pain. 25 tablet 3  . ondansetron (ZOFRAN) 4 MG tablet Take 1 tablet (4 mg total) by mouth every 6 (six) hours as needed for nausea or vomiting. 12 tablet 0  . rosuvastatin (CRESTOR) 20 MG tablet Take 1 tablet (20 mg total) by mouth daily. 90 tablet 3  . venlafaxine XR (EFFEXOR-XR) 150 MG 24 hr capsule TAKE 1 CAPSULE (150 MG) BY MOUTH ONCE A DAY WITH BREAKFAST 90 capsule 5   Social History   Socioeconomic History  . Marital status: Married    Spouse name: Not on file  . Number of children: 3  . Years of education: Not on file  . Highest education level: Not on file  Occupational History  . Occupation: Self employed  Tobacco Use  . Smoking status: Former Smoker    Quit date: 08/14/2014    Years since quitting: 5.0  . Smokeless tobacco: Never Used  Vaping Use  . Vaping Use: Never used  Substance and Sexual Activity  . Alcohol use: Yes    Comment: rarely  . Drug use: No  . Sexual activity: Yes    Partners: Male  Other Topics Concern  . Not on file  Social History Narrative  . Not on file   Social Determinants of Health   Financial Resource Strain:   . Difficulty of Paying Living Expenses:   Food Insecurity:   . Worried About Charity fundraiser in the Last Year:   . Arboriculturist in the Last Year:   Transportation Needs:   . Film/video editor (Medical):   Marland Kitchen Lack of Transportation (Non-Medical):   Physical Activity:   . Days of Exercise per Week:   . Minutes of Exercise per Session:   Stress:   . Feeling of  Stress :   Social Connections:   . Frequency of Communication with Friends and Family:   . Frequency of Social Gatherings with Friends and Family:   . Attends Religious Services:   . Active Member of Clubs or Organizations:   . Attends Archivist Meetings:   Marland Kitchen Marital Status:   Intimate Partner Violence:   . Fear of Current or Ex-Partner:   . Emotionally Abused:   Marland Kitchen Physically Abused:   . Sexually Abused:    Family History  Problem Relation Age of Onset  . Hypertension  Father   . Heart disease Father   . Non-Hodgkin's lymphoma Father   . Hypertension Brother   . Heart disease Paternal Grandfather   . Colon cancer Other        great maternal aunt   . Colon polyps Mother   . Stomach cancer Neg Hx   . Rectal cancer Neg Hx   . Esophageal cancer Neg Hx     OBJECTIVE:  Vitals:   09/03/19 1514 09/03/19 1518  BP:  (!) 147/91  Pulse:  70  Resp:  18  Temp:  98.2 F (36.8 C)  TempSrc:  Oral  SpO2:  95%  Weight: 190 lb (86.2 kg)   Height: 5\' 6"  (1.676 m)      General appearance: alert; appears fatigued, but nontoxic; speaking in full sentences and tolerating own secretions HEENT: NCAT; Ears: EACs clear, TMs pearly gray; Eyes: PERRL.  EOM grossly intact. Sinuses: Maxillary sinuses tender; Nose: congestion with nares patent without rhinorrhea, Throat: oropharynx clear, tonsils 1+ non erythematous or enlarged, uvula midline  Neck: supple without LAD Lungs: unlabored respirations, symmetrical air entry; cough: mild; no respiratory distress; CTAB Heart: regular rate and rhythm.  Radial pulses 2+ symmetrical bilaterally Skin: warm and dry Psychological: alert and cooperative; normal mood and affect  LABS:  Results for orders placed or performed during the hospital encounter of 09/03/19 (from the past 24 hour(s))  POCT rapid strep A     Status: None   Collection Time: 09/03/19  3:25 PM  Result Value Ref Range   Rapid Strep A Screen Negative Negative     ASSESSMENT  & PLAN:  1. Acute non-recurrent maxillary sinusitis   2. URI with cough and congestion     Meds ordered this encounter  Medications  . azithromycin (ZITHROMAX) 250 MG tablet    Sig: Take 1 tablet (250 mg total) by mouth daily. Take first 2 tablets together, then 1 every day until finished.    Dispense:  6 tablet    Refill:  0  . cetirizine (ZYRTEC ALLERGY) 10 MG tablet    Sig: Take 1 tablet (10 mg total) by mouth daily.    Dispense:  30 tablet    Refill:  0  . predniSONE (DELTASONE) 10 MG tablet    Sig: Take 2 tablets (20 mg total) by mouth daily.    Dispense:  15 tablet    Refill:  0    Discharge instructions Get plenty of rest and push fluids Prescribe Zyrtec for nasal congestion and of sore throat Prednisone was prescribed Azithromycin were prescribed Use medications daily for symptom relief Use OTC medications like ibuprofen or tylenol as needed fever or pain Call or go to the ED if you have any new or worsening symptoms such as fever, worsening cough, shortness of breath, chest tightness, chest pain, turning blue, changes in mental status, etc...   Reviewed expectations re: course of current medical issues. Questions answered. Outlined signs and symptoms indicating need for more acute intervention. Patient verbalized understanding. After Visit Summary given.    Note: This document was prepared using Dragon voice recognition software and may include unintentional dictation errors.     Emerson Monte, FNP 09/03/19 1558    Emerson Monte, FNP 09/03/19 1558

## 2019-09-03 NOTE — Discharge Instructions (Addendum)
Get plenty of rest and push fluids Prescribe Zyrtec for nasal congestion and of sore throat Prednisone was prescribed Azithromycin were prescribed Use medications daily for symptom relief Use OTC medications like ibuprofen or tylenol as needed fever or pain Call or go to the ED if you have any new or worsening symptoms such as fever, worsening cough, shortness of breath, chest tightness, chest pain, turning blue, changes in mental status, etc.

## 2019-09-03 NOTE — ED Triage Notes (Addendum)
Sinus pressure, nasal congestion, sore throat and cough since Tuesday.  Has been around her grand kids that were sick with the same symptoms.

## 2019-09-06 ENCOUNTER — Other Ambulatory Visit: Payer: 59

## 2019-09-07 LAB — CULTURE, GROUP A STREP (THRC)

## 2019-09-10 ENCOUNTER — Encounter (INDEPENDENT_AMBULATORY_CARE_PROVIDER_SITE_OTHER): Payer: Self-pay

## 2019-09-13 ENCOUNTER — Ambulatory Visit: Payer: 59 | Admitting: Orthopaedic Surgery

## 2019-09-13 ENCOUNTER — Other Ambulatory Visit: Payer: Self-pay

## 2019-09-13 ENCOUNTER — Encounter (HOSPITAL_BASED_OUTPATIENT_CLINIC_OR_DEPARTMENT_OTHER): Payer: Self-pay | Admitting: Surgery

## 2019-09-14 ENCOUNTER — Other Ambulatory Visit: Payer: 59

## 2019-09-16 ENCOUNTER — Other Ambulatory Visit (HOSPITAL_COMMUNITY): Payer: 59

## 2019-09-19 ENCOUNTER — Other Ambulatory Visit: Payer: Self-pay

## 2019-09-19 ENCOUNTER — Telehealth: Payer: Self-pay | Admitting: Oncology

## 2019-09-19 ENCOUNTER — Other Ambulatory Visit: Payer: 59

## 2019-09-19 ENCOUNTER — Other Ambulatory Visit (HOSPITAL_COMMUNITY)
Admission: RE | Admit: 2019-09-19 | Discharge: 2019-09-19 | Disposition: A | Discharge status: Home / Self Care | Payer: 59 | Source: Ambulatory Visit | Attending: Surgery | Admitting: Surgery

## 2019-09-19 ENCOUNTER — Ambulatory Visit
Admission: RE | Admit: 2019-09-19 | Discharge: 2019-09-19 | Disposition: A | Discharge status: Home / Self Care | Payer: 59 | Source: Ambulatory Visit | Attending: Surgery | Admitting: Surgery

## 2019-09-19 DIAGNOSIS — E669 Obesity, unspecified: Secondary | ICD-10-CM

## 2019-09-19 DIAGNOSIS — Z20822 Contact with and (suspected) exposure to covid-19: Secondary | ICD-10-CM | POA: Insufficient documentation

## 2019-09-19 DIAGNOSIS — E785 Hyperlipidemia, unspecified: Secondary | ICD-10-CM

## 2019-09-19 DIAGNOSIS — D241 Benign neoplasm of right breast: Secondary | ICD-10-CM

## 2019-09-19 DIAGNOSIS — I2510 Atherosclerotic heart disease of native coronary artery without angina pectoris: Secondary | ICD-10-CM

## 2019-09-19 DIAGNOSIS — I10 Essential (primary) hypertension: Secondary | ICD-10-CM

## 2019-09-19 LAB — COMPREHENSIVE METABOLIC PANEL
ALT: 16 IU/L (ref 0–32)
AST: 14 IU/L (ref 0–40)
Albumin/Globulin Ratio: 2 (ref 1.2–2.2)
Albumin: 4.3 g/dL (ref 3.8–4.9)
Alkaline Phosphatase: 77 IU/L (ref 48–121)
BUN/Creatinine Ratio: 13 (ref 9–23)
BUN: 11 mg/dL (ref 6–24)
Bilirubin Total: 0.3 mg/dL (ref 0.0–1.2)
CO2: 23 mmol/L (ref 20–29)
Calcium: 9.8 mg/dL (ref 8.7–10.2)
Chloride: 105 mmol/L (ref 96–106)
Creatinine, Ser: 0.83 mg/dL (ref 0.57–1.00)
GFR calc Af Amer: 91 mL/min/{1.73_m2} (ref >59)
GFR calc non Af Amer: 79 mL/min/{1.73_m2} (ref >59)
Globulin, Total: 2.2 g/dL (ref 1.5–4.5)
Glucose: 103 mg/dL — ABNORMAL HIGH (ref 65–99)
Potassium: 4.3 mmol/L (ref 3.5–5.2)
Sodium: 141 mmol/L (ref 134–144)
Total Protein: 6.5 g/dL (ref 6.0–8.5)

## 2019-09-19 LAB — LIPID PANEL
Chol/HDL Ratio: 2.3 ratio (ref 0.0–4.4)
Cholesterol, Total: 159 mg/dL (ref 100–199)
HDL: 69 mg/dL (ref >39)
LDL Chol Calc (NIH): 76 mg/dL (ref 0–99)
Triglycerides: 76 mg/dL (ref 0–149)
VLDL Cholesterol Cal: 14 mg/dL (ref 5–40)

## 2019-09-19 LAB — SARS CORONAVIRUS 2 BY RT PCR (HOSPITAL ORDER, PERFORMED IN ~~LOC~~ HOSPITAL LAB): SARS Coronavirus 2: NEGATIVE

## 2019-09-19 IMAGING — MG MM PLC BREAST LOC DEV 1ST LESION INC*R*
7 series · 7 of 7 positions shown · non-contrast
Comparison: Previous exam(s).

CLINICAL DATA: Localization prior to surgery.

EXAM:
MAMMOGRAPHIC GUIDED RADIOACTIVE SEED LOCALIZATION OF THE RIGHT
BREAST

[R ML (1 of 4)]
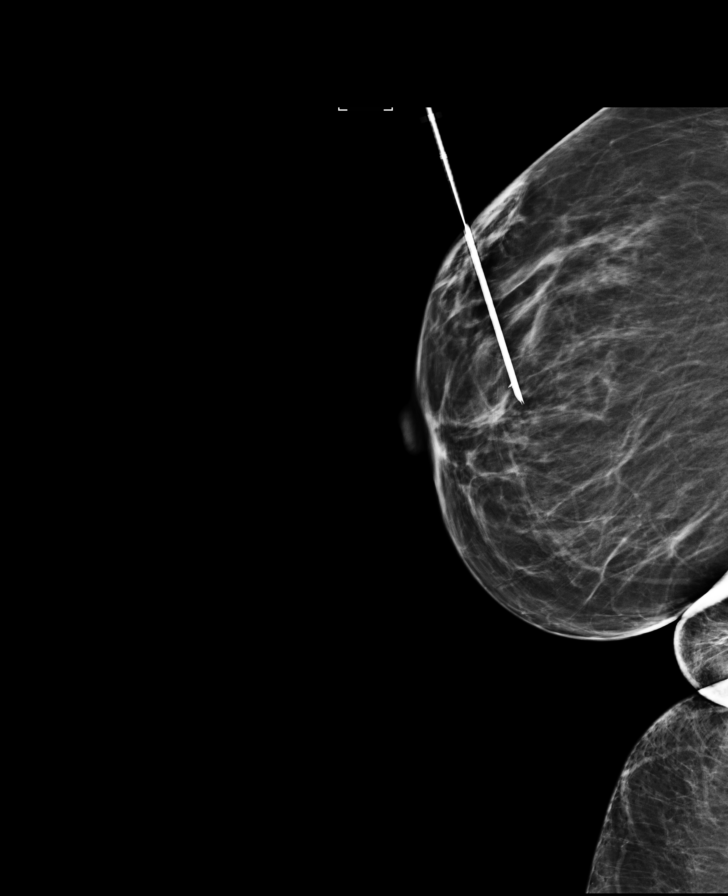

[R ML (2 of 4)]
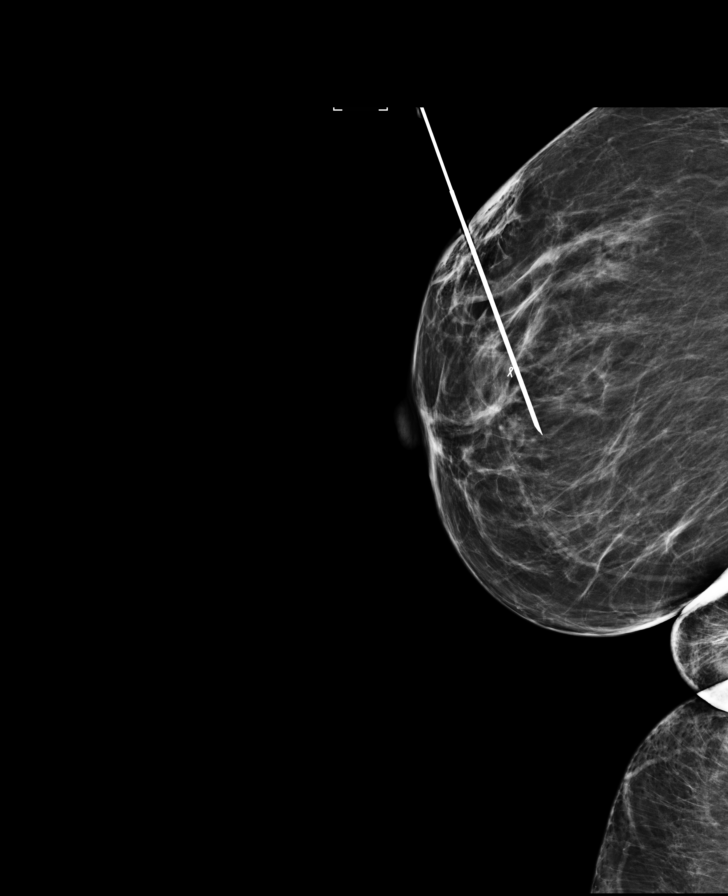

[R CC (1 of 3)]
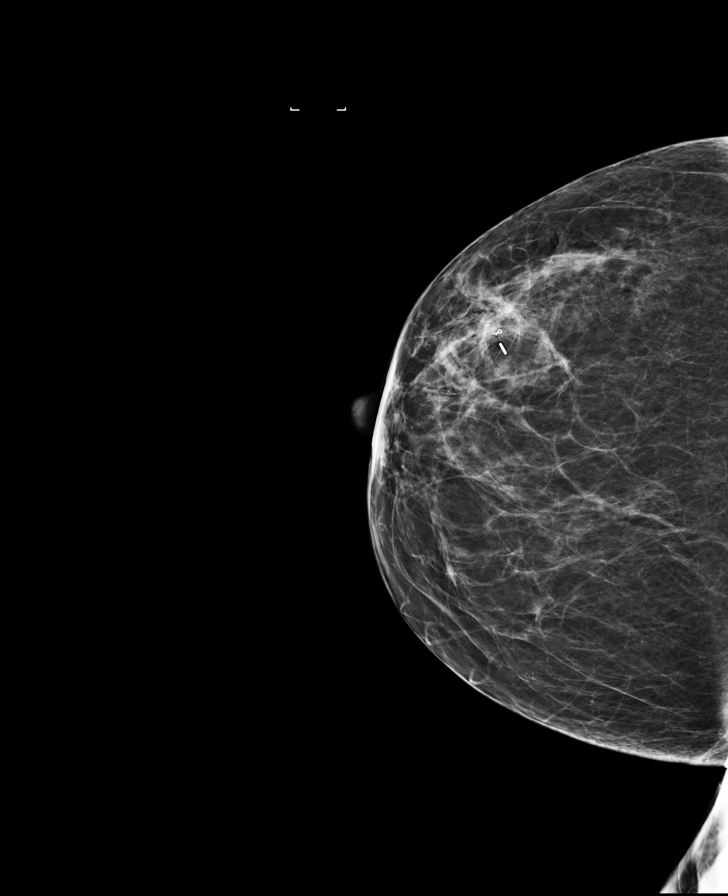

[R ML (3 of 4)]
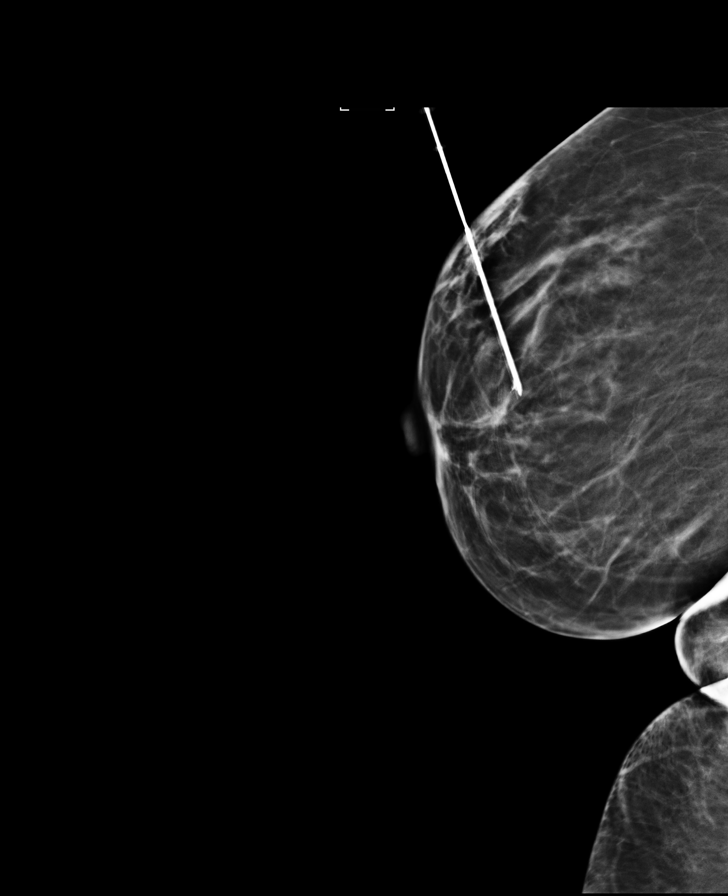

[R CC (2 of 3)]
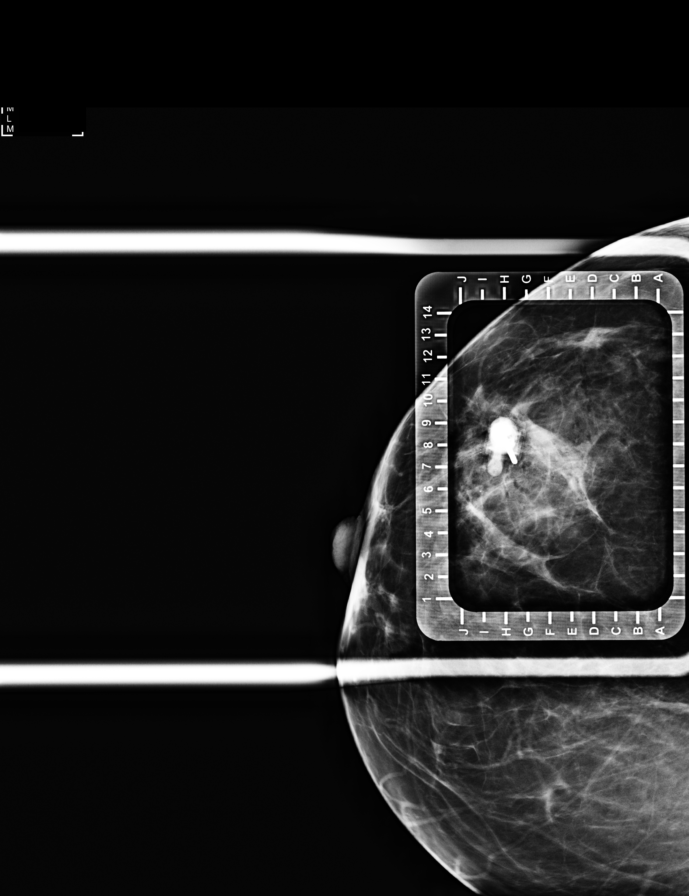

[R ML (4 of 4)]
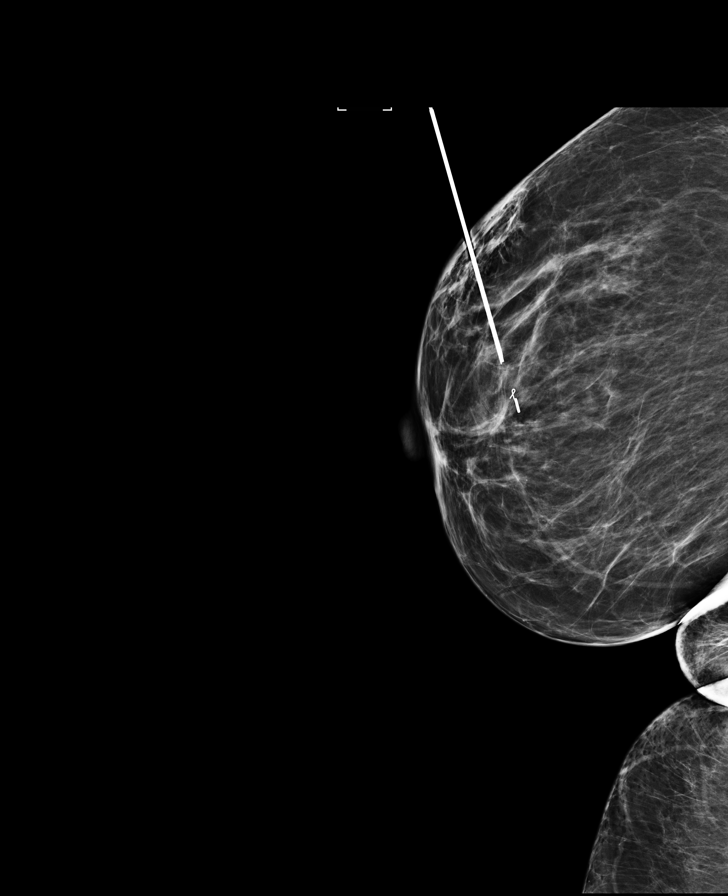

[R CC (3 of 3)]
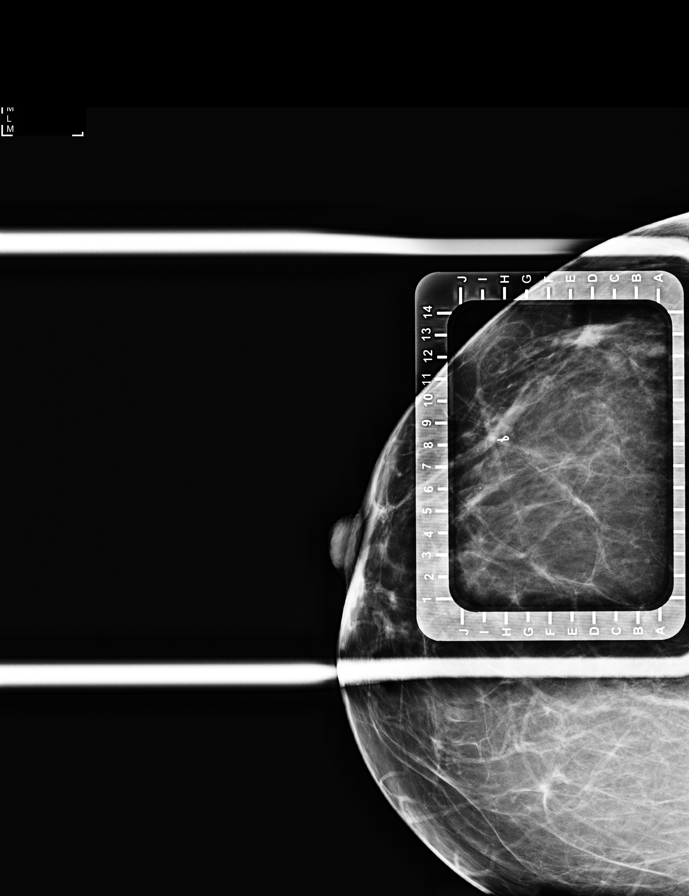

[7 of 7 positions shown; findings below may reference images not displayed]



The usual time-out protocol was performed immediately prior to the
procedure.

Using mammographic guidance, sterile technique, 1% lidocaine and an
[ER] radioactive seed, the biopsy clip was localized using a
superior approach. The follow-up mammogram images confirm the seed
in the expected location and were marked for the surgeon.

Follow-up survey of the patient confirms presence of the radioactive
seed.

Order number of [ER] seed:  [PHONE_NUMBER].

Total activity:  0.246 millicuries reference Date: [DATE]

The patient tolerated the procedure well and was released from the
[REDACTED]. She was given instructions regarding seed removal.
IMPRESSION: Radioactive seed localization right breast. No apparent
complications.

## 2019-09-19 NOTE — Progress Notes (Signed)
CHG wash given with instructions. Pt verbalized understanding    LABS done via office today per pt. Covered         Enhanced Recovery after Surgery for Orthopedics Enhanced Recovery after Surgery is a protocol used to improve the stress on your body and your recovery after surgery.  Patient Instructions   The night before surgery:  o No food after midnight. ONLY clear liquids after midnight   The day of surgery (if you do NOT have diabetes):  o Drink ONE (1) Pre-Surgery Clear Ensure as directed.   o This drink was given to you during your hospital  pre-op appointment visit. o The pre-op nurse will instruct you on the time to drink the  Pre-Surgery Ensure depending on your surgery time. o Finish the drink at the designated time by the pre-op nurse.  o Nothing else to drink after completing the  Pre-Surgery Clear Ensure.   The day of surgery (if you have diabetes): o Drink ONE (1) Gatorade 2 (G2) as directed. o This drink was given to you during your hospital  pre-op appointment visit.  o The pre-op nurse will instruct you on the time to drink the   Gatorade 2 (G2) depending on your surgery time. o Color of the Gatorade may vary. Red is not allowed. o Nothing else to drink after completing the  Gatorade 2 (G2).         If you have questions, please contact your surgeons office.

## 2019-09-19 NOTE — Telephone Encounter (Signed)
R/s appt on 7/8 per 7/2 sch message - unable to reach pt . Left message with appt date and time

## 2019-09-20 ENCOUNTER — Ambulatory Visit (HOSPITAL_BASED_OUTPATIENT_CLINIC_OR_DEPARTMENT_OTHER)
Admission: RE | Admit: 2019-09-20 | Discharge: 2019-09-20 | Disposition: A | Discharge status: Home / Self Care | Payer: 59 | Attending: Surgery | Admitting: Surgery

## 2019-09-20 ENCOUNTER — Other Ambulatory Visit: Payer: Self-pay

## 2019-09-20 ENCOUNTER — Encounter (HOSPITAL_BASED_OUTPATIENT_CLINIC_OR_DEPARTMENT_OTHER): Payer: Self-pay | Admitting: Surgery

## 2019-09-20 ENCOUNTER — Ambulatory Visit
Admission: RE | Admit: 2019-09-20 | Discharge: 2019-09-20 | Disposition: A | Discharge status: Home / Self Care | Payer: 59 | Source: Ambulatory Visit | Attending: Surgery | Admitting: Surgery

## 2019-09-20 ENCOUNTER — Encounter (HOSPITAL_BASED_OUTPATIENT_CLINIC_OR_DEPARTMENT_OTHER)
Admission: RE | Disposition: A | Discharge status: Home / Self Care | Payer: Self-pay | Source: Home / Self Care | Attending: Surgery

## 2019-09-20 ENCOUNTER — Telehealth: Payer: Self-pay | Admitting: Cardiovascular Disease

## 2019-09-20 ENCOUNTER — Ambulatory Visit (HOSPITAL_BASED_OUTPATIENT_CLINIC_OR_DEPARTMENT_OTHER): Payer: 59 | Admitting: Anesthesiology

## 2019-09-20 DIAGNOSIS — Z79890 Hormone replacement therapy (postmenopausal): Secondary | ICD-10-CM | POA: Diagnosis not present

## 2019-09-20 DIAGNOSIS — J449 Chronic obstructive pulmonary disease, unspecified: Secondary | ICD-10-CM | POA: Diagnosis not present

## 2019-09-20 DIAGNOSIS — E039 Hypothyroidism, unspecified: Secondary | ICD-10-CM | POA: Diagnosis not present

## 2019-09-20 DIAGNOSIS — F419 Anxiety disorder, unspecified: Secondary | ICD-10-CM | POA: Insufficient documentation

## 2019-09-20 DIAGNOSIS — D241 Benign neoplasm of right breast: Secondary | ICD-10-CM | POA: Diagnosis not present

## 2019-09-20 DIAGNOSIS — N6011 Diffuse cystic mastopathy of right breast: Secondary | ICD-10-CM | POA: Diagnosis not present

## 2019-09-20 DIAGNOSIS — Z955 Presence of coronary angioplasty implant and graft: Secondary | ICD-10-CM | POA: Insufficient documentation

## 2019-09-20 DIAGNOSIS — Z8719 Personal history of other diseases of the digestive system: Secondary | ICD-10-CM | POA: Diagnosis not present

## 2019-09-20 DIAGNOSIS — Z85850 Personal history of malignant neoplasm of thyroid: Secondary | ICD-10-CM | POA: Insufficient documentation

## 2019-09-20 DIAGNOSIS — Z6830 Body mass index (BMI) 30.0-30.9, adult: Secondary | ICD-10-CM | POA: Diagnosis not present

## 2019-09-20 DIAGNOSIS — Z853 Personal history of malignant neoplasm of breast: Secondary | ICD-10-CM | POA: Diagnosis not present

## 2019-09-20 DIAGNOSIS — Z79899 Other long term (current) drug therapy: Secondary | ICD-10-CM | POA: Diagnosis not present

## 2019-09-20 DIAGNOSIS — I2510 Atherosclerotic heart disease of native coronary artery without angina pectoris: Secondary | ICD-10-CM | POA: Insufficient documentation

## 2019-09-20 DIAGNOSIS — I10 Essential (primary) hypertension: Secondary | ICD-10-CM | POA: Diagnosis not present

## 2019-09-20 DIAGNOSIS — F329 Major depressive disorder, single episode, unspecified: Secondary | ICD-10-CM | POA: Diagnosis not present

## 2019-09-20 DIAGNOSIS — E669 Obesity, unspecified: Secondary | ICD-10-CM | POA: Insufficient documentation

## 2019-09-20 DIAGNOSIS — Z923 Personal history of irradiation: Secondary | ICD-10-CM | POA: Diagnosis not present

## 2019-09-20 DIAGNOSIS — Z87891 Personal history of nicotine dependence: Secondary | ICD-10-CM | POA: Diagnosis not present

## 2019-09-20 HISTORY — PX: BREAST EXCISIONAL BIOPSY: SUR124

## 2019-09-20 HISTORY — DX: Depression, unspecified: F32.A

## 2019-09-20 HISTORY — PX: BREAST LUMPECTOMY WITH RADIOACTIVE SEED LOCALIZATION: SHX6424

## 2019-09-20 IMAGING — DX MM BREAST SURGICAL SPECIMEN
1 series · 2 of 2 positions shown · non-contrast
Comparison: Previous exam(s).

CLINICAL DATA: Status post right breast surgery for removal
papilloma.

EXAM:
SPECIMEN RADIOGRAPH OF THE right BREAST

[Series 2: specimen digital x-ray, derived · right · 0.10mm/px · 2 of 2 slices shown]
[im 1/2]
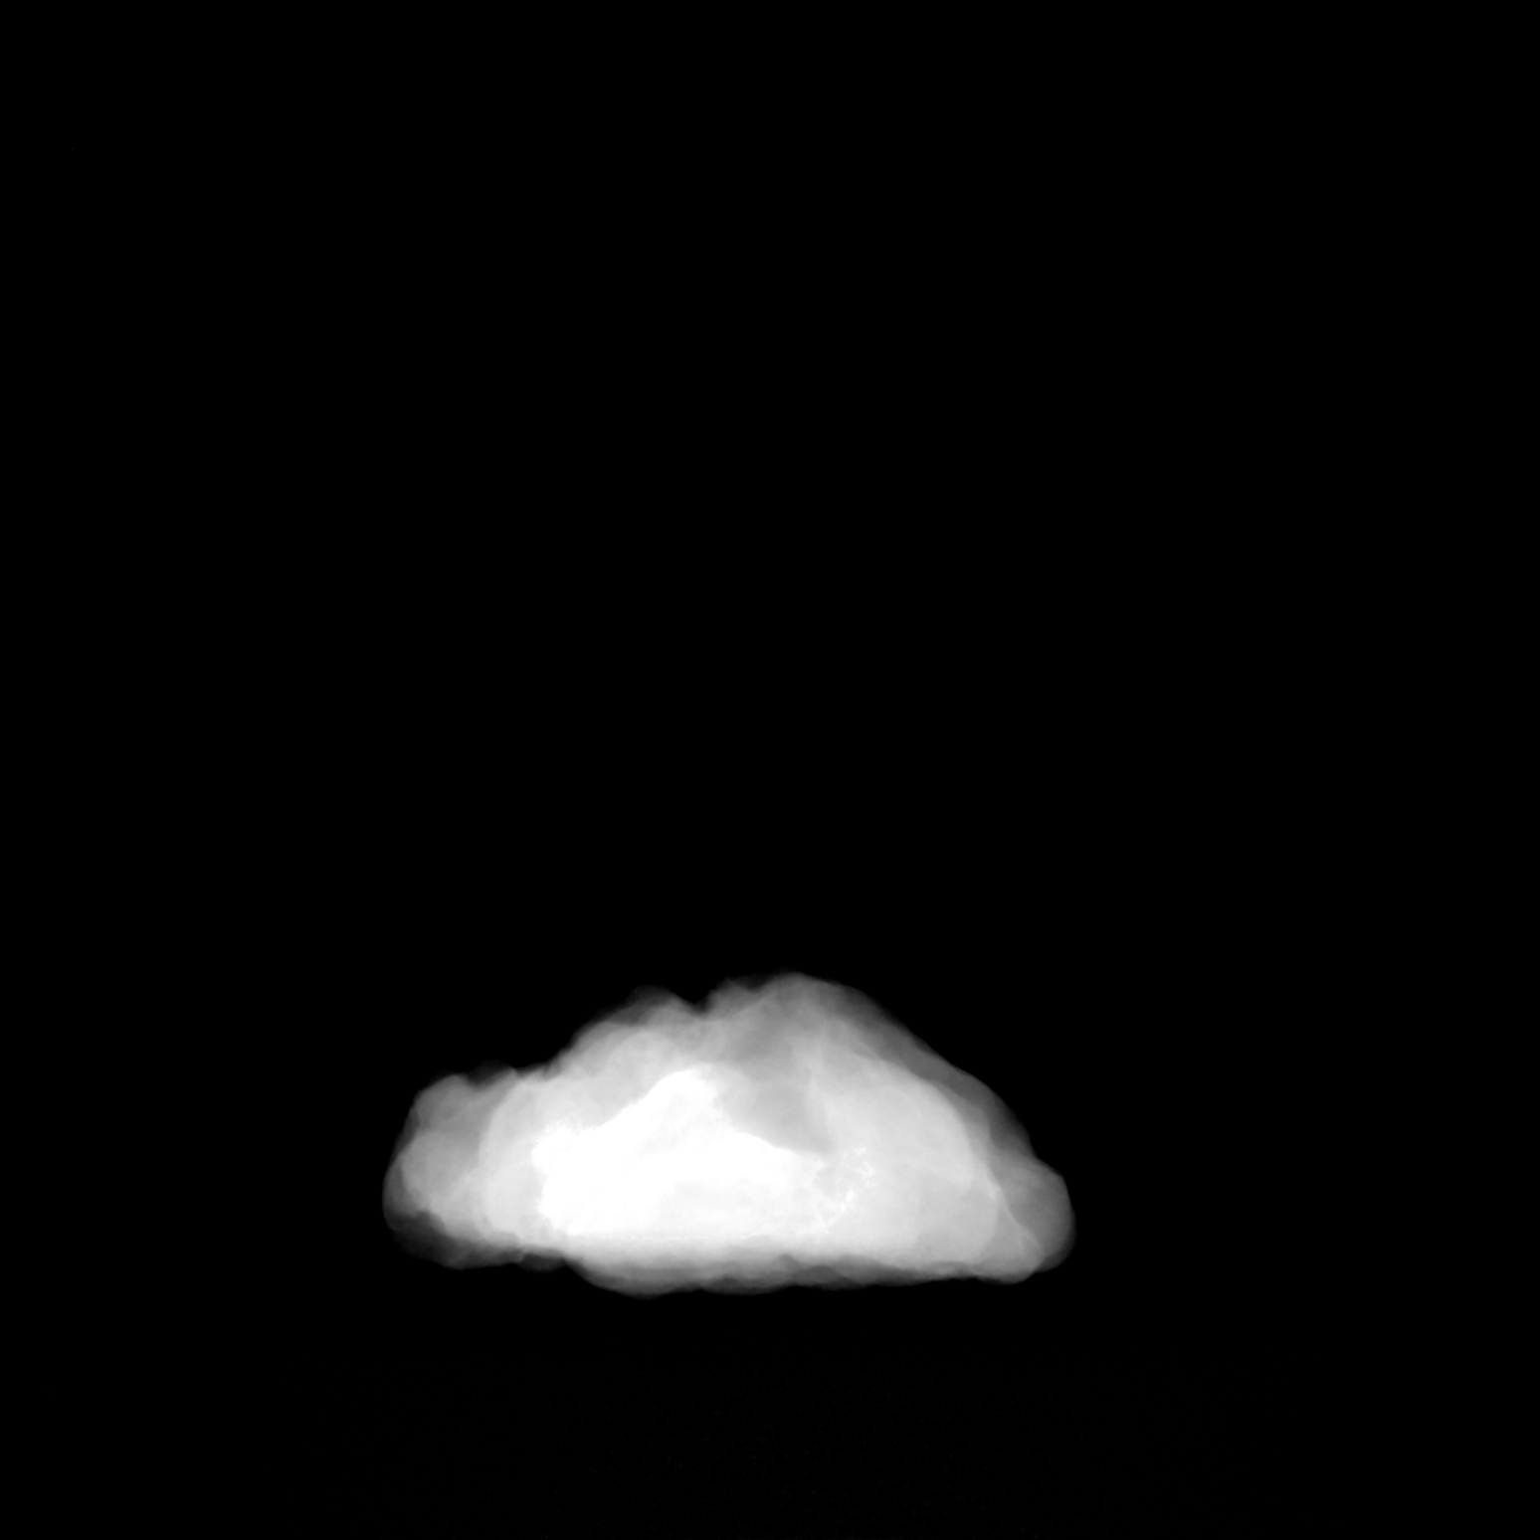
[im 2/2]
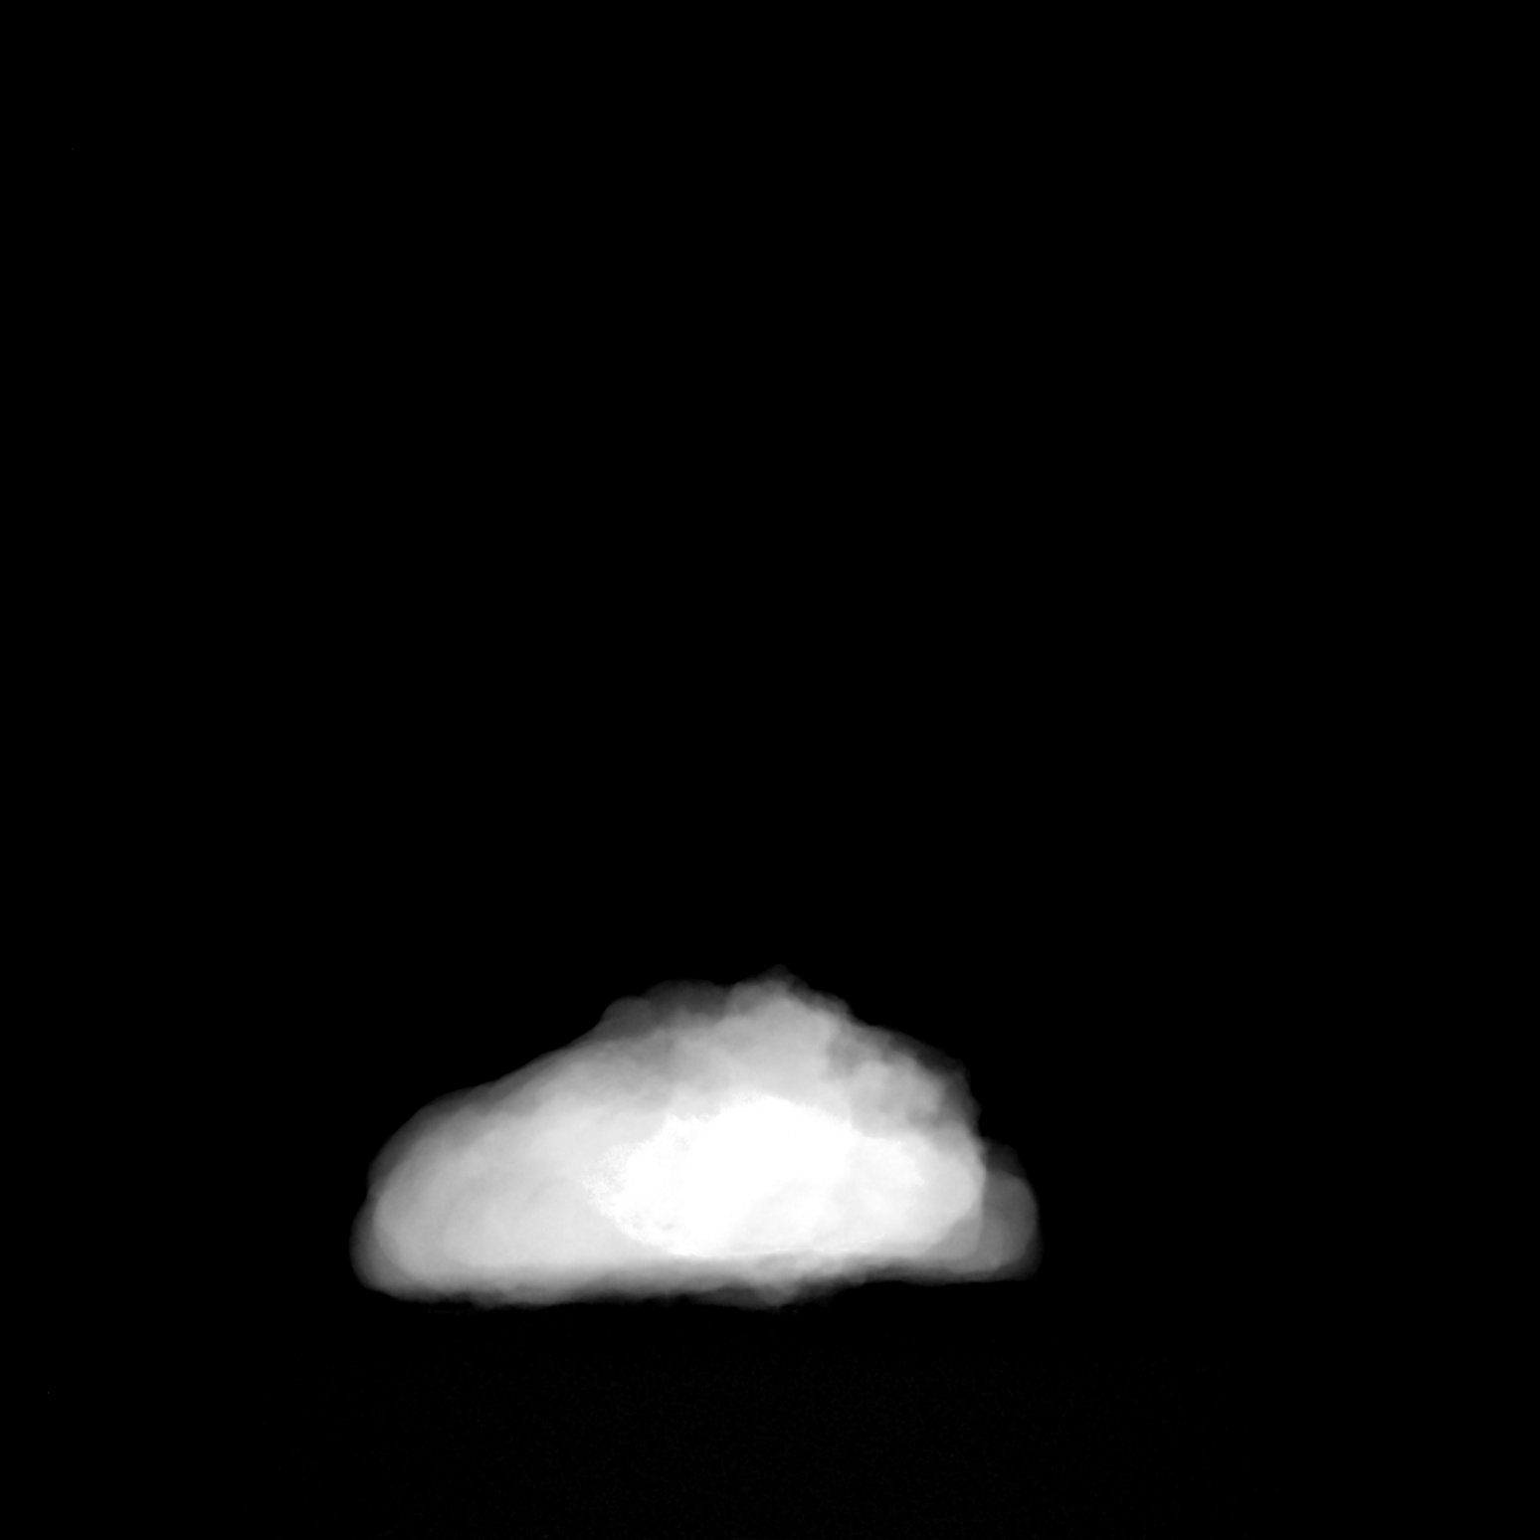

[2 of 2 positions shown; findings below may reference images not displayed]

FINDINGS: Status post excision of the right breast. The radioactive seed and
biopsy marker clip are present, completely intact, and were marked
for pathology.
IMPRESSION: Specimen radiograph of the right breast.

## 2019-09-20 SURGERY — BREAST LUMPECTOMY WITH RADIOACTIVE SEED LOCALIZATION
Anesthesia: General | Site: Breast | Laterality: Right

## 2019-09-20 MED ORDER — MIDAZOLAM HCL 2 MG/2ML IJ SOLN
INTRAMUSCULAR | Status: AC
  Filled 2019-09-20: qty 2

## 2019-09-20 MED ORDER — MEPERIDINE HCL 25 MG/ML IJ SOLN: 6.2500 mg | INTRAMUSCULAR | Status: DC | PRN

## 2019-09-20 MED ORDER — IBUPROFEN 800 MG PO TABS
800.0000 mg | ORAL_TABLET | Freq: Three times a day (TID) | ORAL | 0 refills | Status: DC | PRN
Start: 2019-09-20 — End: 2019-10-16

## 2019-09-20 MED ORDER — HYDROCODONE-ACETAMINOPHEN 5-325 MG PO TABS
1.0000 | ORAL_TABLET | Freq: Four times a day (QID) | ORAL | 0 refills | Status: DC | PRN
Start: 2019-09-20 — End: 2019-10-16

## 2019-09-20 MED ORDER — FENTANYL CITRATE (PF) 100 MCG/2ML IJ SOLN
INTRAMUSCULAR | Status: DC | PRN
  Administered 2019-09-20: 50 ug via INTRAVENOUS

## 2019-09-20 MED ORDER — ONDANSETRON HCL 4 MG/2ML IJ SOLN: 4.0000 mg | Freq: Once | INTRAMUSCULAR | Status: DC | PRN

## 2019-09-20 MED ORDER — FENTANYL CITRATE (PF) 100 MCG/2ML IJ SOLN: 25.0000 ug | INTRAMUSCULAR | Status: DC | PRN

## 2019-09-20 MED ORDER — MIDAZOLAM HCL 5 MG/5ML IJ SOLN
INTRAMUSCULAR | Status: DC | PRN
  Administered 2019-09-20: 2 mg via INTRAVENOUS

## 2019-09-20 MED ORDER — PROPOFOL 10 MG/ML IV BOLUS
INTRAVENOUS | Status: DC | PRN
  Administered 2019-09-20: 150 mg via INTRAVENOUS

## 2019-09-20 MED ORDER — CEFAZOLIN SODIUM-DEXTROSE 2-3 GM-%(50ML) IV SOLR
INTRAVENOUS | Status: DC | PRN
  Administered 2019-09-20: 2 g via INTRAVENOUS

## 2019-09-20 MED ORDER — DEXAMETHASONE SODIUM PHOSPHATE 4 MG/ML IJ SOLN
INTRAMUSCULAR | Status: DC | PRN
  Administered 2019-09-20: 5 mg via INTRAVENOUS

## 2019-09-20 MED ORDER — HYDROCODONE-ACETAMINOPHEN 5-325 MG PO TABS
ORAL_TABLET | ORAL | Status: AC
  Filled 2019-09-20: qty 1

## 2019-09-20 MED ORDER — DEXAMETHASONE SODIUM PHOSPHATE 10 MG/ML IJ SOLN
INTRAMUSCULAR | Status: AC
  Filled 2019-09-20: qty 1

## 2019-09-20 MED ORDER — FENTANYL CITRATE (PF) 100 MCG/2ML IJ SOLN
INTRAMUSCULAR | Status: AC
  Filled 2019-09-20: qty 2

## 2019-09-20 MED ORDER — LIDOCAINE 2% (20 MG/ML) 5 ML SYRINGE
INTRAMUSCULAR | Status: AC
  Filled 2019-09-20: qty 5

## 2019-09-20 MED ORDER — BUPIVACAINE HCL 0.25 % IJ SOLN
INTRAMUSCULAR | Status: DC | PRN
  Administered 2019-09-20: 20 mL

## 2019-09-20 MED ORDER — ONDANSETRON HCL 4 MG/2ML IJ SOLN
INTRAMUSCULAR | Status: DC | PRN
  Administered 2019-09-20: 4 mg via INTRAVENOUS

## 2019-09-20 MED ORDER — BUPIVACAINE HCL (PF) 0.25 % IJ SOLN
INTRAMUSCULAR | Status: AC
  Filled 2019-09-20: qty 90

## 2019-09-20 MED ORDER — LACTATED RINGERS IV SOLN: INTRAVENOUS | Status: DC

## 2019-09-20 MED ORDER — EPHEDRINE SULFATE 50 MG/ML IJ SOLN
INTRAMUSCULAR | Status: DC | PRN
  Administered 2019-09-20 (×2): 10 mg via INTRAVENOUS

## 2019-09-20 MED ORDER — HYDROCODONE-ACETAMINOPHEN 5-325 MG PO TABS
1.0000 | ORAL_TABLET | Freq: Once | ORAL | Status: AC
  Administered 2019-09-20: 1 via ORAL

## 2019-09-20 MED ORDER — ONDANSETRON HCL 4 MG/2ML IJ SOLN
INTRAMUSCULAR | Status: AC
  Filled 2019-09-20: qty 2

## 2019-09-20 MED ORDER — PHENYLEPHRINE HCL (PRESSORS) 10 MG/ML IV SOLN
INTRAVENOUS | Status: DC | PRN
Start: 2019-09-20 — End: 2019-09-20
  Administered 2019-09-20: 80 ug via INTRAVENOUS

## 2019-09-20 MED ORDER — LIDOCAINE HCL (CARDIAC) PF 100 MG/5ML IV SOSY
PREFILLED_SYRINGE | INTRAVENOUS | Status: DC | PRN
  Administered 2019-09-20: 100 mg via INTRAVENOUS

## 2019-09-20 MED ORDER — CEFAZOLIN SODIUM-DEXTROSE 2-4 GM/100ML-% IV SOLN
INTRAVENOUS | Status: AC
  Filled 2019-09-20: qty 100

## 2019-09-20 MED ORDER — PROPOFOL 500 MG/50ML IV EMUL
INTRAVENOUS | Status: AC
  Filled 2019-09-20: qty 50

## 2019-09-20 SURGICAL SUPPLY — 36 items
ADH SKN CLS APL DERMABOND .7 (GAUZE/BANDAGES/DRESSINGS) ×1
APL PRP STRL LF DISP 70% ISPRP (MISCELLANEOUS) ×1
BINDER BREAST XLRG (GAUZE/BANDAGES/DRESSINGS) ×2 IMPLANT
BINDER BREAST XXLRG (GAUZE/BANDAGES/DRESSINGS) IMPLANT
BLADE SURG 15 STRL LF DISP TIS (BLADE) ×1 IMPLANT
BLADE SURG 15 STRL SS (BLADE) ×3
CHLORAPREP W/TINT 26 (MISCELLANEOUS) ×3 IMPLANT
COVER BACK TABLE 60X90IN (DRAPES) ×3 IMPLANT
COVER MAYO STAND STRL (DRAPES) ×3 IMPLANT
COVER PROBE W GEL 5X96 (DRAPES) ×3 IMPLANT
DERMABOND ADVANCED (GAUZE/BANDAGES/DRESSINGS) ×2
DERMABOND ADVANCED .7 DNX12 (GAUZE/BANDAGES/DRESSINGS) ×1 IMPLANT
DRAPE LAPAROTOMY 100X72 PEDS (DRAPES) ×3 IMPLANT
DRAPE UTILITY XL STRL (DRAPES) ×3 IMPLANT
ELECT COATED BLADE 2.86 ST (ELECTRODE) ×3 IMPLANT
ELECT REM PT RETURN 9FT ADLT (ELECTROSURGICAL) ×3
ELECTRODE REM PT RTRN 9FT ADLT (ELECTROSURGICAL) ×1 IMPLANT
GLOVE BIOGEL PI IND STRL 8 (GLOVE) ×1 IMPLANT
GLOVE BIOGEL PI INDICATOR 8 (GLOVE) ×2
GLOVE SURG SS PI 6.5 STRL IVOR (GLOVE) ×2 IMPLANT
GLOVE SURG SS PI 8.0 STRL IVOR (GLOVE) ×2 IMPLANT
GOWN STRL REUS W/ TWL LRG LVL3 (GOWN DISPOSABLE) ×2 IMPLANT
GOWN STRL REUS W/TWL LRG LVL3 (GOWN DISPOSABLE) ×6
KIT MARKER MARGIN INK (KITS) ×3 IMPLANT
NDL HYPO 25X1 1.5 SAFETY (NEEDLE) ×1 IMPLANT
NEEDLE HYPO 25X1 1.5 SAFETY (NEEDLE) ×3 IMPLANT
NS IRRIG 1000ML POUR BTL (IV SOLUTION) ×3 IMPLANT
PACK BASIN DAY SURGERY FS (CUSTOM PROCEDURE TRAY) ×3 IMPLANT
PENCIL SMOKE EVACUATOR (MISCELLANEOUS) ×3 IMPLANT
SLEEVE SCD COMPRESS KNEE MED (MISCELLANEOUS) ×3 IMPLANT
SPONGE LAP 4X18 RFD (DISPOSABLE) ×3 IMPLANT
SUT MNCRL AB 4-0 PS2 18 (SUTURE) ×3 IMPLANT
SUT VICRYL 3-0 CR8 SH (SUTURE) ×3 IMPLANT
SYR CONTROL 10ML LL (SYRINGE) ×3 IMPLANT
TOWEL GREEN STERILE FF (TOWEL DISPOSABLE) ×3 IMPLANT
TRAY FAXITRON CT DISP (TRAY / TRAY PROCEDURE) ×3 IMPLANT

## 2019-09-20 NOTE — Telephone Encounter (Signed)
Left message for patient to call back.  Spoke with Melina Copa, PA who saw the patient in May and addressed holding the ASA and plavix prior to her breast surgery. Per Dayna, the patient is to restart ASA only, not plavix, after her surgery once the surgeon deemed it safe to restart.

## 2019-09-20 NOTE — Anesthesia Preprocedure Evaluation (Addendum)
Anesthesia Evaluation  Patient identified by MRN, date of birth, ID band Patient awake    Reviewed: Allergy & Precautions, NPO status , Patient's Chart, lab work & pertinent test results  History of Anesthesia Complications (+) PONV and history of anesthetic complications  Airway Mallampati: III  TM Distance: >3 FB Neck ROM: Full    Dental  (+) Poor Dentition, Missing, Dental Advisory Given   Pulmonary COPD,  COPD inhaler, former smoker,    Pulmonary exam normal breath sounds clear to auscultation       Cardiovascular hypertension, Pt. on medications + angina + CAD and + Cardiac Stents  Normal cardiovascular exam Rhythm:Regular Rate:Normal  DES to LAD 4/21  EKG 08/04/19 NSR, No acute changes  Echo 06/05/19 1. Left ventricular ejection fraction, by estimation, is 60 to 65%. The left ventricle has normal function. The left ventricle has no regional wall motion abnormalities. There is mild asymmetric left ventricular hypertrophy. Left ventricular diastolic parameters were normal.  2. Right ventricular systolic function is normal. The right ventricular size is normal. There is mildly elevated pulmonary artery systolic pressure.  3. The mitral valve is normal in structure. No evidence of mitral valve regurgitation. No evidence of mitral stenosis.  4. The aortic valve is normal in structure. Aortic valve regurgitation is not visualized. No aortic stenosis is present.   Cardiac Cath 06/29/19  Mid LAD lesion is 80% stenosed.  A drug-eluting stent was successfully placed using a STENT RESOLUTE ONYX 3.0X18.  Post intervention, there is a 0% residual stenosis.   1. Severe stenosis mid LAD 2. Successful PTCA/DES x 1 mid LAD    Neuro/Psych PSYCHIATRIC DISORDERS Anxiety Depression negative neurological ROS     GI/Hepatic Neg liver ROS, hiatal hernia,   Endo/Other  Hypothyroidism Hyperlipidemia Hx/o left breast Ca S/P RT Right  breast papilloma Hx/o thyroid Ca Obesity  Renal/GU negative Renal ROS  negative genitourinary   Musculoskeletal negative musculoskeletal ROS (+)   Abdominal (+) + obese,   Peds  Hematology Plavix therapy- last dose 6/30   Anesthesia Other Findings   Reproductive/Obstetrics                            Anesthesia Physical Anesthesia Plan  ASA: III  Anesthesia Plan: General   Post-op Pain Management:    Induction: Intravenous  PONV Risk Score and Plan: 4 or greater and Scopolamine patch - Pre-op, Midazolam, Dexamethasone, Ondansetron and Treatment may vary due to age or medical condition  Airway Management Planned: LMA  Additional Equipment:   Intra-op Plan:   Post-operative Plan: Extubation in OR  Informed Consent: I have reviewed the patients History and Physical, chart, labs and discussed the procedure including the risks, benefits and alternatives for the proposed anesthesia with the patient or authorized representative who has indicated his/her understanding and acceptance.     Dental advisory given  Plan Discussed with: CRNA, Surgeon and Anesthesiologist  Anesthesia Plan Comments:        Anesthesia Quick Evaluation

## 2019-09-20 NOTE — Op Note (Signed)
Preoperative diagnosis: Right breast papilloma  Postoperative diagnosis: Same  Procedure: Right breast seed localized lumpectomy  Surgeon: Erroll Luna, MD  Anesthesia: LMA with 0.25% Marcaine plain local  EBL: Minimal  Specimen: Right breast tissue with seed and clip verified by Faxitron  Drains: None  IV fluids: Per anesthesia record  Indications for procedure: The patient presents for right breast lumpectomy secondary to mammographic abnormality which showed a lesion in the right breast upper outer quadrant.  Core biopsy showed this to be a papilloma.  Discussed possible upgrade risk of 20% to a more malignant lesion.  Observation offered.  She agreed to proceed with lumpectomy on the right.  The procedure has been discussed with the patient. Alternatives to surgery have been discussed with the patient.  Risks of surgery include bleeding,  Infection,  Seroma formation, death,  and the need for further surgery.   The patient understands and wishes to proceed.   Description of procedure: Patient met in holding area.  Neoprobe used to verify seed location in the right breast.  All questions were answered and this was marked as the correct site.  She was then taken back to the operating room.  She was placed supine upon the OR table.  After induction of general anesthesia, right breast was prepped and draped in sterile fashion timeout performed.  Neoprobe used to verify seed location right breast upper outer quadrant and films available for review.  Once the hotspot identified it was marked with a pen.  Curvilinear incision was made in the lateral right breast.  Dissection was carried down all tissue around the seed and clip were excised with a grossly negative margin.  Images revealed both seed and clip to be in the specimen and this was oriented with ink and sent to pathology.  Hemostasis achieved.  Cavity infiltrated with 0.25% Marcaine plain.  Hemostasis was excellent.  Wound closed with  3-0 Vicryl and 4 Monocryl.  Dermabond applied.  Breast binder placed.  All counts found to be correct.  Patient was awoke extubated taken to recovery in satisfactory condition.

## 2019-09-20 NOTE — Discharge Instructions (Signed)
[facility] °Office Phone Number [phone] ° °BREAST BIOPSY/ PARTIAL MASTECTOMY: POST OP INSTRUCTIONS ° °Always review your discharge instruction sheet given to you by the facility where your surgery was performed. ° °IF YOU HAVE DISABILITY OR FAMILY LEAVE FORMS, YOU MUST BRING THEM TO THE OFFICE FOR PROCESSING.  DO NOT GIVE THEM TO YOUR DOCTOR. ° °1. A prescription for pain medication may be given to you upon discharge.  Take your pain medication as prescribed, if needed.  If narcotic pain medicine is not needed, then you may take acetaminophen (Tylenol) or ibuprofen (Advil) as needed. °2. Take your usually prescribed medications unless otherwise directed °3. If you need a refill on your pain medication, please contact your pharmacy.  They will contact our office to request authorization.  Prescriptions will not be filled after 5pm or on week-ends. °4. You should eat very light the first 24 hours after surgery, such as soup, crackers, pudding, etc.  Resume your normal diet the day after surgery. °5. Most patients will experience some swelling and bruising in the breast.  Ice packs and a good support bra will help.  Swelling and bruising can take several days to resolve.  °6. It is common to experience some constipation if taking pain medication after surgery.  Increasing fluid intake and taking a stool softener will usually help or prevent this problem from occurring.  A mild laxative (Milk of Magnesia or Miralax) should be taken according to package directions if there are no bowel movements after 48 hours. °7. Unless discharge instructions indicate otherwise, you may remove your bandages 24-48 hours after surgery, and you may shower at that time.  You may have steri-strips (small skin tapes) in place directly over the incision.  These strips should be left on the skin for 7-10 days.  If your surgeon used skin glue on the incision, you may shower in 24 hours.  The glue will flake off over the  next 2-3 weeks.  Any sutures or staples will be removed at the office during your follow-up visit. °8. ACTIVITIES:  You may resume regular daily activities (gradually increasing) beginning the next day.  Wearing a good support bra or sports bra minimizes pain and swelling.  You may have sexual intercourse when it is comfortable. °a. You may drive when you no longer are taking prescription pain medication, you can comfortably wear a seatbelt, and you can safely maneuver your car and apply brakes. °b. RETURN TO WORK:  ______________________________________________________________________________________ °9. You should see your doctor in the office for a follow-up appointment approximately two weeks after your surgery.  Your doctor’s nurse will typically make your follow-up appointment when she calls you with your pathology report.  Expect your pathology report 2-3 business days after your surgery.  You may call to check if you do not hear from us after three days. °10. OTHER INSTRUCTIONS: _______________________________________________________________________________________________ _____________________________________________________________________________________________________________________________________ °_____________________________________________________________________________________________________________________________________ °_____________________________________________________________________________________________________________________________________ ° °WHEN TO CALL YOUR DOCTOR: °1. Fever over 101.0 °2. Nausea and/or vomiting. °3. Extreme swelling or bruising. °4. Continued bleeding from incision. °5. Increased pain, redness, or drainage from the incision. ° °The clinic staff is available to answer your questions during regular business hours.  Please don’t hesitate to call and ask to speak to one of the nurses for clinical concerns.  If you have a medical emergency, go to the nearest  emergency room or call 911.  A surgeon from [facility] is always on call at the hospital. ° °For further questions, please visit [email or web address]  ° ° ° ° °  Post Anesthesia Home Care Instructions ° °Activity: °Get plenty of rest for the remainder of the day. A responsible individual must stay with you for 24 hours following the procedure.  °For the next 24 hours, DO NOT: °-Drive a car °-Operate machinery °-Drink alcoholic beverages °-Take any medication unless instructed by your physician °-Make any legal decisions or sign important papers. ° °Meals: °Start with liquid foods such as gelatin or soup. Progress to regular foods as tolerated. Avoid greasy, spicy, heavy foods. If nausea and/or vomiting occur, drink only clear liquids until the nausea and/or vomiting subsides. Call your physician if vomiting continues. ° °Special Instructions/Symptoms: °Your throat may feel dry or sore from the anesthesia or the breathing tube placed in your throat during surgery. If this causes discomfort, gargle with warm salt water. The discomfort should disappear within 24 hours. ° °If you had a scopolamine patch placed behind your ear for the management of post- operative nausea and/or vomiting: ° °1. The medication in the patch is effective for 72 hours, after which it should be removed.  Wrap patch in a tissue and discard in the trash. Wash hands thoroughly with soap and water. °2. You may remove the patch earlier than 72 hours if you experience unpleasant side effects which may include dry mouth, dizziness or visual disturbances. °3. Avoid touching the patch. Wash your hands with soap and water after contact with the patch. °  ° °

## 2019-09-20 NOTE — H&P (Signed)
History of Present Illness PM) Patient words: [name] is a pleasant 59 y.o. female with Stage IA left breast invasive ductal carcinoma, ER+/PR+/HER2-, diagnosed in 10/2017, treated with lumpectomy, adjuvant radiation therapy, and anti-estrogen therapy with Tamoxifen, that she was unable to tolerate   She has no complaints of pain, discharge or mass.        Patient with history of left breast cancer. Patient presents for evaluation of palpable abnormality within the outer aspect of the right breast. Work up revealed papilloma   EXAM: DIGITAL DIAGNOSTIC RIGHT MAMMOGRAM WITH CAD AND TOMO  ULTRASOUND RIGHT BREAST  COMPARISON: Previous exam(s).  ACR Breast Density Category c: The breast tissue is heterogeneously dense, which may obscure small masses.  FINDINGS: No concerning masses, calcifications or distortion identified within the right breast.  Mammographic images were processed with CAD.  On physical exam, dense tissue is palpated within the upper-outer right breast.  Targeted ultrasound is performed, showing normal dense tissue without suspicious mass right breast 10 o'clock position 6 cm from nipple at the reported site of palpable concern.  Within the right breast 10 o'clock position 3 cm from nipple there is a 9 x 11 x 3 mm lobular hypoechoic mass which may be intraductal.  No right axillary adenopathy.  IMPRESSION: Indeterminate right breast mass 10 o'clock position 3 cm from nipple.  No suspicious abnormality at the site of palpable concern right breast 10 o'clock position 6 cm from nipple.  RECOMMENDATION: Ultrasound-guided core needle biopsy indeterminate right breast mass 10 o'clock 3 cm from nipple position.  I have discussed the findings and recommendations with the patient. If applicable, a reminder letter will be sent to the patient regarding the next appointment.  BI-RADS CATEGORY 4: Suspicious.   Electronically  Signed By: Lovey Newcomer M.D. On: 04/26/2019 12:40.  The patient is a 59 year old female.   Problem List/Past Medical 10:42 AM) BREAST CANCER, LEFT (C50.912)  BREAST CANCER, FEMALE (C50.919)  POSTOPERATIVE STATE (Z98.890)  LEFT BREAST MASS (N63.20)   Past Surgical History ) Appendectomy  Colon Polyp Removal - Colonoscopy  Hysterectomy (not due to cancer) - Partial  Oral Surgery  Thyroid Surgery   Diagnostic Studies History Colonoscopy  within last year Mammogram  1-3 years ago Pap Smear  1-5 years ago  AllergiesoxyCODONE HCl *ANALGESICS - OPIOID*   Medication History  Venlafaxine HCl ER (37.'5MG'$  Capsule ER 24HR, Oral) Active. Albuterol (90MCG/ACT Aerosol Soln, Inhalation) Active. Levothyroxine Sodium (137MCG Tablet, Oral) Active. Vitamin D (Ergocalciferol) (50000UNIT Capsule, Oral) Active. Medications Reconciled  Social History Alcohol use  Occasional alcohol use. Caffeine use  Coffee. No drug use  Tobacco use  Former smoker.  Family History  Alcohol Abuse  Family Members In General. Cancer  Father. Heart Disease  Father. Heart disease in female family member before age 61  Hypertension  Father. Respiratory Condition  Father.  Pregnancy / Birth HistoryAM) Age at menarche  80 years. Gravida  3 Length (months) of breastfeeding  3-6 Maternal age  11-20 Para  3  Other Problems  Back Pain  Heart murmur  Pancreatitis  Thyroid Cancer  Thyroid Disease   Vitals 05/12/2019 10:41 AM Weight: 195.38 lb Height: 66in Body Surface Area: 1.98 m Body Mass Index: 31.53 kg/m  Pulse: 82 (Regular)  BP: 132/82 (Sitting, Left Arm, Standard)       Physical Exam General Mental Status-Alert. General Appearance-Consistent with stated age. Hydration-Well hydrated. Voice-Normal.  Eye Eyeball - Bilateral-Extraocular movements intact. Sclera/Conjunctiva - Bilateral-No scleral  icterus.  Breast  Note: Left breast shows no masses. No nipple discharge. Right breast is postoperative change without mass lesion.   Lymphatic Head & Neck  General Head & Neck Lymphatics: Bilateral - Description - Normal. Axillary  General Axillary Region: Bilateral - Description - Normal. Tenderness - Non Tender.    Assessment & Plan  BREAST CANCER, LEFT (C50.912) Impression: NAD  TOTAL TIME 20 MINUTES BREAST MASS, RIGHT (N63.10) Right breast papilloma Recommend right breast seed lumpectomy due to possible upgrade risk  The procedure has been discussed with the patient. Alternatives to surgery have been discussed with the patient.  Risks of surgery include bleeding,  Infection,  Seroma formation, death,  and the need for further surgery.   The patient understands and wishes to proceed. Current Plans Pt Education - [facility] Free Text Education/Instructions: discussed with patient and provided information. Started Valium 2 MG Oral Tablet, 1 (one) Tablet as needed, #1, 05/12/2019, No Refill.

## 2019-09-20 NOTE — Interval H&P Note (Signed)
History and Physical Interval Note:  09/20/2019 8:23 AM  Bethany [name]  has presented today for surgery, with the diagnosis of PAPILLOMA.  The various methods of treatment have been discussed with the patient and family. After consideration of risks, benefits and other options for treatment, the patient has consented to  Procedure(s): RIGHT BREAST LUMPECTOMY WITH RADIOACTIVE SEED LOCALIZATION (Right) as a surgical intervention.  The patient's history has been reviewed, patient examined, no change in status, stable for surgery.  I have reviewed the patient's chart and labs.  Questions were answered to the patient's satisfaction.     Turner Daniels MD

## 2019-09-20 NOTE — Transfer of Care (Signed)
Immediate Anesthesia Transfer of Care Note  Patient: Bethany [name]  Procedure(s) Performed: RIGHT BREAST LUMPECTOMY WITH RADIOACTIVE SEED LOCALIZATION (Right Breast)  Patient Location: PACU  Anesthesia Type:General  Level of Consciousness: sedated  Airway & Oxygen Therapy: Patient Spontanous Breathing and Patient connected to face mask oxygen  Post-op Assessment: Report given to RN and Post -op Vital signs reviewed and stable  Post vital signs: Reviewed and stable  Last Vitals:  Vitals Value Taken Time  BP 123/77 09/20/19 0916  Temp    Pulse 73 09/20/19 0918  Resp 12 09/20/19 0918  SpO2 100 % 09/20/19 0918  Vitals shown include unvalidated device data.  Last Pain:  Vitals:   09/20/19 0738  TempSrc: Oral  PainSc: 0-No pain      Patients Stated Pain Goal: 6 (81/84/03 7543)  Complications: No complications documented.

## 2019-09-20 NOTE — Telephone Encounter (Signed)
Patient had surgery this morning. She wants to know when she should restart taking her plavix again.  Or if she even needs to continue taking it.

## 2019-09-20 NOTE — Anesthesia Postprocedure Evaluation (Signed)
Anesthesia Post Note  Patient: Bethany [name]  Procedure(s) Performed: RIGHT BREAST LUMPECTOMY WITH RADIOACTIVE SEED LOCALIZATION (Right Breast)     Patient location during evaluation: PACU Anesthesia Type: General Level of consciousness: awake Pain management: pain level controlled Vital Signs Assessment: post-procedure vital signs reviewed and stable Respiratory status: spontaneous breathing Cardiovascular status: stable Postop Assessment: no apparent nausea or vomiting Anesthetic complications: no   No complications documented.  Last Vitals:  Vitals:   09/20/19 0930 09/20/19 0945  BP: 123/68 121/71  Pulse: 75 73  Resp: 18 15  Temp:    SpO2: 99% 94%    Last Pain:  Vitals:   09/20/19 0945  TempSrc:   PainSc: 0-No pain                 Desmond Tufano

## 2019-09-20 NOTE — Anesthesia Procedure Notes (Signed)
Procedure Name: LMA Insertion Date/Time: 09/20/2019 8:33 AM Performed by: Maryella Shivers, CRNA Pre-anesthesia Checklist: Patient identified, Emergency Drugs available, Suction available and Patient being monitored Patient Re-evaluated:Patient Re-evaluated prior to induction Oxygen Delivery Method: Circle system utilized Preoxygenation: Pre-oxygenation with 100% oxygen Induction Type: IV induction Ventilation: Mask ventilation without difficulty LMA: LMA inserted LMA Size: 4.0 Number of attempts: 1 Airway Equipment and Method: Bite block Placement Confirmation: positive ETCO2 Tube secured with: Tape Dental Injury: Teeth and Oropharynx as per pre-operative assessment

## 2019-09-21 ENCOUNTER — Telehealth: Payer: Self-pay | Admitting: *Deleted

## 2019-09-21 ENCOUNTER — Inpatient Hospital Stay: Payer: 59

## 2019-09-21 ENCOUNTER — Encounter (HOSPITAL_BASED_OUTPATIENT_CLINIC_OR_DEPARTMENT_OTHER): Payer: Self-pay | Admitting: Surgery

## 2019-09-21 ENCOUNTER — Inpatient Hospital Stay: Payer: 59 | Admitting: Oncology

## 2019-09-21 ENCOUNTER — Telehealth: Payer: Self-pay | Admitting: Physician Assistant

## 2019-09-21 DIAGNOSIS — E785 Hyperlipidemia, unspecified: Secondary | ICD-10-CM

## 2019-09-21 LAB — SURGICAL PATHOLOGY

## 2019-09-21 MED ORDER — ROSUVASTATIN CALCIUM 40 MG PO TABS: 40.0000 mg | ORAL_TABLET | Freq: Every day | ORAL | 3 refills | Status: DC

## 2019-09-21 NOTE — Telephone Encounter (Signed)
-----   Message from [name], [place] sent at 09/19/2019  5:04 PM EDT ----- Please let pt know labs are generally fine. Blood sugar a few pts above normal, so watch sugar intake (not yet in diabetes range but something to monitor going forward with primary care). Lipid panel shows LDL is 76. This is pretty good but goal LDL for h/o CAD is <70. If she tolerated the increase in Crestor to 20mg , would increase to 40mg  and repeat LFTs/lipids in about 3 months. Good luck on surgery tomorrow!

## 2019-09-21 NOTE — Telephone Encounter (Signed)
Transferred call to Jennifer.

## 2019-09-22 NOTE — Telephone Encounter (Signed)
Left message for patient to call back  

## 2019-09-22 NOTE — Telephone Encounter (Signed)
Pt notified via mychart to restart asa and NOT plavix.

## 2019-10-02 ENCOUNTER — Ambulatory Visit: Payer: 59 | Admitting: Orthopaedic Surgery

## 2019-10-10 ENCOUNTER — Encounter: Payer: Self-pay | Admitting: Orthopaedic Surgery

## 2019-10-10 ENCOUNTER — Ambulatory Visit (INDEPENDENT_AMBULATORY_CARE_PROVIDER_SITE_OTHER): Payer: 59 | Admitting: Orthopaedic Surgery

## 2019-10-10 ENCOUNTER — Other Ambulatory Visit: Payer: Self-pay

## 2019-10-10 ENCOUNTER — Ambulatory Visit (INDEPENDENT_AMBULATORY_CARE_PROVIDER_SITE_OTHER): Payer: 59

## 2019-10-10 DIAGNOSIS — S52132A Displaced fracture of neck of left radius, initial encounter for closed fracture: Secondary | ICD-10-CM

## 2019-10-10 DIAGNOSIS — S52132D Displaced fracture of neck of left radius, subsequent encounter for closed fracture with routine healing: Secondary | ICD-10-CM

## 2019-10-10 NOTE — Progress Notes (Signed)
The patient is now 4 months status post an injury to her left elbow in which she sustained a nondisplaced left radial neck fracture.  Her last follow-up visit in which she was still in the postoperative post 90-day fracture.  Was canceled due to me being out with a GI virus.  This would have been a follow-up for her.  She denies any pain with her left elbow and says she is doing well.  On examination of her left elbow there is no pain over the radial head or neck.  Her range of motion is entirely full and she has 5 out of 5 strength of the elbow.  Her elbow is ligamentously stable.  2 views left elbow show a healed radial neck fracture.  This point follow-up with me can be as needed.  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  We will set up a follow-up appointment with Dr. Erlinda Hong for the possibility of surgery on her left basilar thumb joint to treat CMC arthritis.

## 2019-10-12 ENCOUNTER — Inpatient Hospital Stay (HOSPITAL_BASED_OUTPATIENT_CLINIC_OR_DEPARTMENT_OTHER): Payer: 59 | Admitting: Oncology

## 2019-10-12 ENCOUNTER — Inpatient Hospital Stay: Payer: 59 | Attending: Oncology

## 2019-10-12 ENCOUNTER — Other Ambulatory Visit: Payer: Self-pay

## 2019-10-12 ENCOUNTER — Telehealth: Payer: Self-pay | Admitting: Oncology

## 2019-10-12 VITALS — BP 134/88 | HR 66 | Temp 98.5°F | Resp 20 | Ht 66.0 in | Wt 195.8 lb

## 2019-10-12 DIAGNOSIS — F418 Other specified anxiety disorders: Secondary | ICD-10-CM | POA: Insufficient documentation

## 2019-10-12 DIAGNOSIS — I10 Essential (primary) hypertension: Secondary | ICD-10-CM | POA: Diagnosis not present

## 2019-10-12 DIAGNOSIS — I341 Nonrheumatic mitral (valve) prolapse: Secondary | ICD-10-CM | POA: Insufficient documentation

## 2019-10-12 DIAGNOSIS — E785 Hyperlipidemia, unspecified: Secondary | ICD-10-CM | POA: Insufficient documentation

## 2019-10-12 DIAGNOSIS — I2510 Atherosclerotic heart disease of native coronary artery without angina pectoris: Secondary | ICD-10-CM | POA: Insufficient documentation

## 2019-10-12 DIAGNOSIS — Z7982 Long term (current) use of aspirin: Secondary | ICD-10-CM | POA: Diagnosis not present

## 2019-10-12 DIAGNOSIS — C50412 Malignant neoplasm of upper-outer quadrant of left female breast: Secondary | ICD-10-CM | POA: Diagnosis not present

## 2019-10-12 DIAGNOSIS — C773 Secondary and unspecified malignant neoplasm of axilla and upper limb lymph nodes: Secondary | ICD-10-CM | POA: Diagnosis not present

## 2019-10-12 DIAGNOSIS — K219 Gastro-esophageal reflux disease without esophagitis: Secondary | ICD-10-CM | POA: Diagnosis not present

## 2019-10-12 DIAGNOSIS — F329 Major depressive disorder, single episode, unspecified: Secondary | ICD-10-CM | POA: Insufficient documentation

## 2019-10-12 DIAGNOSIS — J449 Chronic obstructive pulmonary disease, unspecified: Secondary | ICD-10-CM | POA: Diagnosis not present

## 2019-10-12 DIAGNOSIS — I158 Other secondary hypertension: Secondary | ICD-10-CM | POA: Diagnosis not present

## 2019-10-12 DIAGNOSIS — I209 Angina pectoris, unspecified: Secondary | ICD-10-CM | POA: Diagnosis not present

## 2019-10-12 DIAGNOSIS — Z79811 Long term (current) use of aromatase inhibitors: Secondary | ICD-10-CM | POA: Diagnosis not present

## 2019-10-12 DIAGNOSIS — Z801 Family history of malignant neoplasm of trachea, bronchus and lung: Secondary | ICD-10-CM | POA: Diagnosis not present

## 2019-10-12 DIAGNOSIS — Z87891 Personal history of nicotine dependence: Secondary | ICD-10-CM | POA: Insufficient documentation

## 2019-10-12 DIAGNOSIS — Z85850 Personal history of malignant neoplasm of thyroid: Secondary | ICD-10-CM | POA: Insufficient documentation

## 2019-10-12 DIAGNOSIS — Z800 Family history of malignant neoplasm of digestive organs: Secondary | ICD-10-CM | POA: Insufficient documentation

## 2019-10-12 DIAGNOSIS — Z170 Estrogen receptor positive status [ER+]: Secondary | ICD-10-CM

## 2019-10-12 DIAGNOSIS — E039 Hypothyroidism, unspecified: Secondary | ICD-10-CM | POA: Diagnosis not present

## 2019-10-12 DIAGNOSIS — Z79899 Other long term (current) drug therapy: Secondary | ICD-10-CM | POA: Diagnosis not present

## 2019-10-12 LAB — CBC WITH DIFFERENTIAL/PLATELET
Abs Immature Granulocytes: 0.02 10*3/uL (ref 0.00–0.07)
Basophils Absolute: 0 10*3/uL (ref 0.0–0.1)
Basophils Relative: 1 %
Eosinophils Absolute: 0.3 10*3/uL (ref 0.0–0.5)
Eosinophils Relative: 6 %
HCT: 38 % (ref 36.0–46.0)
Hemoglobin: 12.2 g/dL (ref 12.0–15.0)
Immature Granulocytes: 0 %
Lymphocytes Relative: 33 %
Lymphs Abs: 1.8 10*3/uL (ref 0.7–4.0)
MCH: 30 pg (ref 26.0–34.0)
MCHC: 32.1 g/dL (ref 30.0–36.0)
MCV: 93.4 fL (ref 80.0–100.0)
Monocytes Absolute: 0.5 10*3/uL (ref 0.1–1.0)
Monocytes Relative: 9 %
Neutro Abs: 2.9 10*3/uL (ref 1.7–7.7)
Neutrophils Relative %: 51 %
Platelets: 262 10*3/uL (ref 150–400)
RBC: 4.07 MIL/uL (ref 3.87–5.11)
RDW: 13.4 % (ref 11.5–15.5)
WBC: 5.5 10*3/uL (ref 4.0–10.5)
nRBC: 0 % (ref 0.0–0.2)

## 2019-10-12 LAB — COMPREHENSIVE METABOLIC PANEL
ALT: 27 U/L (ref 0–44)
AST: 20 U/L (ref 15–41)
Albumin: 3.8 g/dL (ref 3.5–5.0)
Alkaline Phosphatase: 78 U/L (ref 38–126)
Anion gap: 8 (ref 5–15)
BUN: 17 mg/dL (ref 6–20)
CO2: 27 mmol/L (ref 22–32)
Calcium: 9.7 mg/dL (ref 8.9–10.3)
Chloride: 105 mmol/L (ref 98–111)
Creatinine, Ser: 0.86 mg/dL (ref 0.44–1.00)
GFR calc Af Amer: >60 mL/min (ref >60)
GFR calc non Af Amer: >60 mL/min (ref >60)
Glucose, Bld: 97 mg/dL (ref 70–99)
Potassium: 4.2 mmol/L (ref 3.5–5.1)
Sodium: 140 mmol/L (ref 135–145)
Total Bilirubin: 0.4 mg/dL (ref 0.3–1.2)
Total Protein: 6.7 g/dL (ref 6.5–8.1)

## 2019-10-12 MED ORDER — EXEMESTANE 25 MG PO TABS
25.0000 mg | ORAL_TABLET | Freq: Every day | ORAL | 4 refills | Status: DC
Start: 2019-10-12 — End: 2019-10-16

## 2019-10-12 NOTE — Progress Notes (Signed)
[place]  [phone] [phone]     ID: Bethany [name] DOB: Apr 11, 1960  MR#: [id number]  JOI#:[id number]  Patient Care Team: Leeroy Cha, MD as PCP - General (Internal Medicine) Burnell Blanks, MD as PCP - Cardiology (Cardiology) Erroll Luna, MD as Consulting Physician (General Surgery) Nachum Derossett, Virgie Dad, MD as Consulting Physician (Oncology) Kyung Rudd, MD as Consulting Physician (Radiation Oncology) Irene Shipper, MD as Consulting Physician (Gastroenterology) [name], MD as Consulting Physician (Obstetrics and Gynecology) Delrae Rend, MD as Consulting Physician (Endocrinology) Mauro Kaufmann, RN as Oncology Nurse Navigator Rockwell Germany, RN as Oncology Nurse Navigator Icard, Octavio Graves, DO as Consulting Physician (Pulmonary Disease) Mcarthur Rossetti, MD as Consulting Physician (Orthopedic Surgery) OTHER MD:   CHIEF COMPLAINT: Estrogen receptor positive breast cancer  CURRENT TREATMENT: anastrozole   INTERVAL HISTORY: Bethany [name] was seen today for follow-up of her estrogen receptor positive breast cancer.   She was switched to anastrozole at her last visit on 06/12/2019.  However after her fall when she broke her wrist she took herself off and has not restarted the medication.  She says that it made her crampy and depressed and she was worried about osteoporosis issues.  She was scheduled to undergo right lumpectomy for papilloma on 06/29/2019, but this was rescheduled due to higher need for heart catheterization.  She is status post stenting and is followed by Dr. Angelena Form for that.  She underwent right lumpectomy on 09/20/2019 under Dr. Brantley Stage. Pathology from the procedure [phone]) showed: ductal papilloma; fibrocystic changes with calcifications.   REVIEW OF SYSTEMS: Bethany [name] tells me she is very depressed despite the venlafaxine.  She did well with her surgery however, without significant  problems with pain or bleeding and of course she is pleased that the benign results.  She is anxious because she is not taking antiestrogens but she is also anxious because of possible side effects.  She is worried about bone density issues.  She is now off the Plavix which she only took for 3 months.  She was told not to restarted after surgery.  A detailed review of systems today was otherwise stable    HISTORY OF CURRENT ILLNESS: The original intake note:  "Bethany [name]" had routine screening mammography on 10/26/2017 showing a possible abnormality in the left breast. She underwent unilateral left diagnostic mammography with tomography and left breast ultrasonography at [facility] on 10/16/2017 showing: breast density category B. There was a hypoechoic lesion consistent of a mass at the 2:30 o'clock upper outer quadrant middle depth and measuring 0.5 x 0.3 x 0.4 cm. Sonographic evaluation of the left axilla shows no enlarged or abnormal lymph nodes.  An attempt was made to obtain a biopsy of this lesion on 10/20/2017, however the patient had repeated episodes of syncope during the attempted procedure.  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 Margins were negative for carcinoma. Prognostic indicators significant for: estrogen receptor, 90% positive and progesterone receptor, 80% positive, both with strong staining intensity. Proliferation marker Ki67 at 3%. HER2 negative with an immunohistochemistry of (1+).  Then in a separate procedure she underwent left sentinel lymph node sampling on 12/09/2017 showing ([id number]): Four left axillary sentinel lymph were negative for carcinoma. (0/4).   Her subsequent history is as detailed below.   PAST MEDICAL HISTORY: Past Medical History:  Diagnosis Date  . Anxiety   . Breast cancer ([place])  left breast cancer/diagnosed in 10/2017/taking radiation treatment  until 03/15/18  . Breast mass, left 11/2017   going thru radiation until 02/2018  . CAD (coronary artery disease)    a. DES to LAD 06/2019.  Marland Kitchen Chest pain 06/19/2014  . COPD (chronic obstructive pulmonary disease) (HCC)    beginning stages/small scar  . Dental crowns present   . Depression   . Hiatal hernia   . History of hiatal hernia    no current med.  Marland Kitchen History of thyroid cancer 12/15/2017  . Hypertension    states under control with med., has been on med. x 1 yr.  . Hypothyroidism   . Malignant neoplasm of upper-outer quadrant of left breast in female, estrogen receptor positive (HCC)   . Mild hyperlipidemia   . 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 MVP (mitral valve prolapse)   . Personal history of radiation therapy   . PONV (postoperative nausea and vomiting)   . Radiation fibrosis of lung (HCC)   . Syncope and collapse 12/25/2008   Qualifier: Diagnosis of  By: [facility] Pod    . Urinary incontinence    USI   . Vitamin D deficiency   Thyroid cancer, GERD   PAST SURGICAL HISTORY: Past Surgical History:  Procedure Laterality Date  . ABDOMINAL HYSTERECTOMY     partial  . APPENDECTOMY    . AXILLARY SENTINEL NODE BIOPSY Left 12/09/2017   Procedure: AXILLARY SENTINEL NODE BIOPSY;  Surgeon: Harriette Bouillon, MD;  Location: [facility];  Service: General;  Laterality: Left;  . BREAST LUMPECTOMY Left 11/2017  . BREAST LUMPECTOMY WITH NEEDLE LOCALIZATION Left 11/25/2017   Procedure: LEFT BREAST LUMPECTOMY WITH NEEDLE LOCALIZATION;  Surgeon: Harriette Bouillon, MD;  Location: [facility];  Service: General;  Laterality: Left;  . BREAST LUMPECTOMY WITH RADIOACTIVE SEED LOCALIZATION Right 09/20/2019   Procedure: RIGHT BREAST LUMPECTOMY WITH RADIOACTIVE SEED LOCALIZATION;  Surgeon: Harriette Bouillon, MD;  Location: [facility];  Service: General;  Laterality: Right;  . COLONOSCOPY WITH PROPOFOL  10/03/2015  . CORONARY STENT INTERVENTION N/A  06/29/2019   Procedure: CORONARY STENT INTERVENTION;  Surgeon: Kathleene Hazel, MD;  Location: [facility];  Service: Cardiovascular;  Laterality: N/A;  . LAPAROSCOPIC APPENDECTOMY N/A 10/03/2015   Procedure: APPENDECTOMY LAPAROSCOPIC;  Surgeon: Emelia Loron, MD;  Location[facility];  Service: General;  Laterality: N/A;  . LEFT HEART CATH AND CORONARY ANGIOGRAPHY N/A 06/29/2019   Procedure: LEFT HEART CATH AND CORONARY ANGIOGRAPHY;  Surgeon: Kathleene Hazel, MD;  Location: [facility];  Service: Cardiovascular;  Laterality: N/A;  . TOTAL THYROIDECTOMY  04/02/2003  Hysterectomy without BSO   FAMILY HISTORY Family History  Problem Relation Age of Onset  . Hypertension Father   . Heart disease Father   . Non-Hodgkin's lymphoma Father   . Hypertension Brother   . Heart disease Paternal Grandfather   . Colon cancer Other        great maternal aunt   . Colon polyps Mother   . Stomach cancer Neg Hx   . Rectal cancer Neg Hx   . Esophageal cancer Neg Hx    The patient's father died in 2019/01/23age age 15 due to non-Hodgkin's lymphoma. The patient's mother is alive at age 4 as of October 2019. The patient has 1 brother, no sisters. There was a paternal aunt with lung cancer. There was a paternal grandmother and maternal grandfather with throat cancer. She denies a family history of breast  or ovarian cancer.    GYNECOLOGIC HISTORY:  No LMP recorded. Patient has had a hysterectomy. Menarche: 59 years old Age at first live birth: 59 years old She is GX P3.  She is status post partial hysterectomy (without BSO) in her late 2's. She did not have HRT.    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 Her husband, Deniece Portela, is a Copywriter, advertising for [employer]. At home is her, her husband, and their two dogs. The patient's oldest son, [place], has 3 children and lives in [place] and works in Psychologist, clinical for [employer]. The patient's son, Gabriel Cirri  lives in [place] with 1 daughter and works as a Programmer, systems. The patient's youngest son, Romeo Apple lives in [place] and works for [employer] in [place]. The patient has 5 grandchildren total. Her grandchildren are ages 45, 63, 45, 37, and 3. She notes the youngest has Down Syndrome and recently turned 3. She does not currently belong to a church, but she is [place].     ADVANCED DIRECTIVES: In the absence of any documentation to the contrary, the patient's spouse is their HCPOA.    HEALTH MAINTENANCE: Social History   Tobacco Use  . Smoking status: Former Smoker    Quit date: 08/14/2014    Years since quitting: 5.1  . Smokeless tobacco: Never Used  Vaping Use  . Vaping Use: Never used  Substance Use Topics  . Alcohol use: Yes    Comment: rarely  . Drug use: No    Colonoscopy: 03/15/2018- to be repeated in 3 years under Dr. Marina Goodell  PAP:   Bone density: 10/25/2017 showed osteopenia   Allergies  Allergen Reactions  . Oxycodone Nausea And Vomiting  . Latex Rash  . Other Rash    "Sutures caused severe rash."  . Sulfa Antibiotics     hives    Current Outpatient Medications  Medication Sig Dispense Refill  . albuterol (VENTOLIN HFA) 108 (90 Base) MCG/ACT inhaler Inhale 2 puffs into the lungs every 6 (six) hours as needed for wheezing or shortness of breath. 18 g 6  . aspirin EC 81 MG tablet Take 81 mg by mouth daily.    . cetirizine (ZYRTEC ALLERGY) 10 MG tablet Take 1 tablet (10 mg total) by mouth daily. 30 tablet 0  . ergocalciferol (VITAMIN D2) 50000 UNITS capsule Take 50,000 Units by mouth once a week.     Marland Kitchen HYDROcodone-acetaminophen (NORCO/VICODIN) 5-325 MG tablet Take 1 tablet by mouth every 6 (six) hours as needed for moderate pain. 15 tablet 0  . ibuprofen (ADVIL) 800 MG tablet Take 1 tablet (800 mg total) by mouth every 8 (eight) hours as needed. 30 tablet 0  . levothyroxine (SYNTHROID, LEVOTHROID) 137 MCG tablet Take 137 mcg by mouth at bedtime.     Marland Kitchen  losartan-hydrochlorothiazide (HYZAAR) 50-12.5 MG tablet Take 1 tablet by mouth daily.    . Multiple Vitamin (MULTIVITAMIN WITH MINERALS) TABS tablet Take 1 tablet by mouth daily.    . nitroGLYCERIN (NITROSTAT) 0.4 MG SL tablet Place 1 tablet (0.4 mg total) under the tongue every 5 (five) minutes as needed for chest pain. 25 tablet 3  . rosuvastatin (CRESTOR) 40 MG tablet Take 1 tablet (40 mg total) by mouth daily. 90 tablet 3  . venlafaxine XR (EFFEXOR-XR) 150 MG 24 hr capsule TAKE 1 CAPSULE (150 MG) BY MOUTH ONCE A DAY WITH BREAKFAST 90 capsule 5   No current facility-administered medications for this visit.    OBJECTIVE: white woman in no  acute distress  Vitals:   10/12/19 0823  BP: (!) 134/88  Pulse: 66  Resp: 20  Temp: 98.5 F (36.9 C)  SpO2: 99%     Body mass index is 31.6 kg/m.   Wt Readings from Last 3 Encounters:  10/12/19 195 lb 12.8 oz (88.8 kg)  09/20/19 189 lb 9.5 oz (86 kg)  09/03/19 190 lb (86.2 kg)      ECOG FS:1 - Symptomatic but completely ambulatory  Sclerae unicteric, EOMs intact Wearing a mask No cervical or supraclavicular adenopathy Lungs no rales or rhonchi Heart regular rate and rhythm Abd soft, nontender, positive bowel sounds MSK no focal spinal tenderness, no upper extremity lymphedema Neuro: nonfocal, well oriented, appropriate affect Breasts: The right breast is status post recent lumpectomy.  The incision is healing very nicely.  The cosmetic result is excellent.  There is no skin or nipple change of concern.  In the left breast is status post remote lumpectomy and radiation.  There is no evidence of local recurrence.  Both axillae are benign.   LAB RESULTS:  CMP     Component Value Date/Time   NA 141 09/19/2019 1155   K 4.3 09/19/2019 1155   CL 105 09/19/2019 1155   CO2 23 09/19/2019 1155   GLUCOSE 103 (H) 09/19/2019 1155   GLUCOSE 96 06/30/2019 0555   BUN 11 09/19/2019 1155   CREATININE 0.83 09/19/2019 1155   CREATININE 0.78  12/16/2017 1540   CALCIUM 9.8 09/19/2019 1155   PROT 6.5 09/19/2019 1155   ALBUMIN 4.3 09/19/2019 1155   AST 14 09/19/2019 1155   AST 12 (L) 12/16/2017 1540   ALT 16 09/19/2019 1155   ALT 16 12/16/2017 1540   ALKPHOS 77 09/19/2019 1155   BILITOT 0.3 09/19/2019 1155   BILITOT 0.4 12/16/2017 1540   GFRNONAA 79 09/19/2019 1155   GFRNONAA >60 12/16/2017 1540   GFRAA 91 09/19/2019 1155   GFRAA >60 12/16/2017 1540    No results found for: TOTALPROTELP, ALBUMINELP, A1GS, A2GS, BETS, BETA2SER, GAMS, MSPIKE, SPEI  No results found for: KPAFRELGTCHN, LAMBDASER, KAPLAMBRATIO  Lab Results  Component Value Date   WBC 5.5 10/12/2019   NEUTROABS 2.9 10/12/2019   HGB 12.2 10/12/2019   HCT 38.0 10/12/2019   MCV 93.4 10/12/2019   PLT 262 10/12/2019   No results found for: LABCA2  No components found for: [id number]  No results for input(s): INR in the last 168 hours.  No results found for: LABCA2  No results found for: LZJ673  No results found for: ALP379  No results found for: KWI097  No results found for: CA2729  No components found for: HGQUANT  No results found for: CEA1 / No results found for: CEA1   No results found for: AFPTUMOR  No results found for: CHROMOGRNA  No results found for: HGBA, HGBA2QUANT, HGBFQUANT, HGBSQUAN (Hemoglobinopathy evaluation)   No results found for: LDH  No results found for: IRON, TIBC, IRONPCTSAT (Iron and TIBC)  No results found for: FERRITIN  Urinalysis    Component Value Date/Time   COLORURINE YELLOW 10/02/2015 2022   APPEARANCEUR CLOUDY (A) 10/02/2015 2022   LABSPEC 1.020 10/02/2015 2022   PHURINE 6.5 10/02/2015 2022   GLUCOSEU NEGATIVE 10/02/2015 2022   HGBUR NEGATIVE 10/02/2015 2022   BILIRUBINUR NEGATIVE 10/02/2015 2022   KETONESUR 15 (A) 10/02/2015 2022   PROTEINUR NEGATIVE 10/02/2015 2022   UROBILINOGEN 0.2 09/20/2014 1536   NITRITE NEGATIVE 10/02/2015 2022   LEUKOCYTESUR SMALL (A) 10/02/2015 2022  STUDIES: MM Breast Surgical Specimen  Result Date: 09/20/2019 CLINICAL DATA:  Status post right breast surgery for removal papilloma. EXAM: SPECIMEN RADIOGRAPH OF THE right BREAST COMPARISON:  Previous exam(s). FINDINGS: Status post excision of the right breast. The radioactive seed and biopsy marker clip are present, completely intact, and were marked for pathology. IMPRESSION: Specimen radiograph of the right breast. Electronically Signed   By: Abelardo Diesel M.D.   On: 09/20/2019 08:59   MM RT RADIOACTIVE SEED LOC MAMMO GUIDE  Result Date: 09/19/2019 CLINICAL DATA:  Localization prior to surgery. EXAM: MAMMOGRAPHIC GUIDED RADIOACTIVE SEED LOCALIZATION OF THE RIGHT BREAST COMPARISON:  Previous exam(s). FINDINGS: Patient presents for radioactive seed localization prior to surgery. I met with the patient and we discussed the procedure of seed localization including benefits and alternatives. We discussed the high likelihood of a successful procedure. We discussed the risks of the procedure including infection, bleeding, tissue injury and further surgery. We discussed the low dose of radioactivity involved in the procedure. Informed, written consent was given. The usual time-out protocol was performed immediately prior to the procedure. Using mammographic guidance, sterile technique, 1% lidocaine and an I-125 radioactive seed, the biopsy clip was localized using a superior approach. The follow-up mammogram images confirm the seed in the expected location and were marked for the surgeon. Follow-up survey of the patient confirms presence of the radioactive seed. Order number of I-125 seed:  [id number]. Total activity:  3.790 millicuries reference Date: August 24, 2019 The patient tolerated the procedure well and was released from the [facility]. She was given instructions regarding seed removal. IMPRESSION: Radioactive seed localization right breast. No apparent complications. Electronically Signed   By:  Dorise Bullion III M.D   On: 09/19/2019 13:35   XR Elbow 2 Views Left  Result Date: 10/10/2019 2 views of the left elbow compared to previous films show a healed nondisplaced radial neck fracture.  There is no evidence of osteonecrosis and the elbow joint is congruent.    ELIGIBLE FOR AVAILABLE RESEARCH PROTOCOL: No   ASSESSMENT: 59 y.o. Bethany [name], [place] woman status post left breast upper outer quadrant lumpectomy 11/25/2017 for a pT1b pNX, stage Ia invasive ductal carcinoma, grade 1, estrogen and progesterone receptor positive, HER-2/neu negative, with an MIB-1 of 3%  (1) status post left axillary lymph node sampling 12/09/2017, all 4 sentinel lymph nodes clear  (2) Oncotype score of 24 predicts a 10 % risk of recurrence outside the breast over the next 9 years if the patient's only systemic therapy is an estrogens for 5 years.  It also predicts no benefit from chemotherapy  (3) adjuvant radiation 01/27/2018 - 03/15/2018  (a) left breast / 50.4 Gy in 28 fractions  (b) boost / 10 Gy in 5 fractions  (4) started tamoxifen 04/16/2018  (a) changed to 10 mg daily May 2020 per patient  (b) discontinued per patient December 2020 secondary to side effects  (5) anastrozole started June 12, 2019, discontinued by the patient after a few weeks because of depression, cramps, and concerns regarding bone density  (6) right lumpectomy 09/20/2019 showed only a ductal papilloma, no evidence of malignancy  (7) exemestane started 10/12/2019  (a) bone density August 2021  (8) intensified screening:  (a) breast MRI February 2022  PLAN: Bethany [name] has had a Shanda Howells first half of 2021, with a wrist fracture, cardiac disease, and her second breast surgery.  Hopefully all that is now behind her and she can move forward.  She wondered if we could do  something beyond venlafaxine in terms of antidepressant and we certainly can but what I recommended is that she intensify her exercise program, make sure she works  out enough to sweat at least 5 days a week and I think that will relieve a great deal of her stress as well as help her heart, her weight, her bones, and her mood.  We discussed tamoxifen and anastrozole in great detail.  She understands she has a good prognosis without antiestrogens but is very likely to develop further problems with her breast in the future unless she tolerates some antiestrogen therapy.  We ended up discussed the exemestane and I have put in the prescription for her.  We will do a virtual visit in September just to make sure she is tolerating it well and then she will see me again in early December for routine follow-up.  She is appropriately interested in intensified screening and she will have her first breast MRI in February 2022  She knows to call for any other issue that may develop before the next visit  Total encounter time 40 minutes.*   Tray Klayman, Virgie Dad, MD  10/12/19 8:44 AM Medical Oncology and Hematology [facility] [place], [place] [address] Tel. [phone]    Fax. [phone]   I, Wilburn Mylar, am acting as scribe for Dr. Virgie Dad. Jeral Zick.  I, Lurline Del MD, have reviewed the above documentation for accuracy and completeness, and I agree with the above.   *Total Encounter Time as defined by the Centers for Medicare and Medicaid Services includes, in addition to the face-to-face time of a patient visit (documented in the note above) non-face-to-face time: obtaining and reviewing outside history, ordering and reviewing medications, tests or procedures, care coordination (communications with other health care professionals or caregivers) and documentation in the medical record.

## 2019-10-12 NOTE — Telephone Encounter (Signed)
Scheduled appts per 7/29 los. Gave pt a print out of AVS.

## 2019-10-16 ENCOUNTER — Encounter (INDEPENDENT_AMBULATORY_CARE_PROVIDER_SITE_OTHER): Payer: Self-pay | Admitting: Bariatrics

## 2019-10-16 ENCOUNTER — Other Ambulatory Visit: Payer: Self-pay

## 2019-10-16 ENCOUNTER — Ambulatory Visit (INDEPENDENT_AMBULATORY_CARE_PROVIDER_SITE_OTHER): Payer: 59 | Admitting: Bariatrics

## 2019-10-16 VITALS — BP 116/76 | HR 66 | Temp 97.8°F | Ht 66.0 in | Wt 192.0 lb

## 2019-10-16 DIAGNOSIS — Z9189 Other specified personal risk factors, not elsewhere classified: Secondary | ICD-10-CM

## 2019-10-16 DIAGNOSIS — E559 Vitamin D deficiency, unspecified: Secondary | ICD-10-CM

## 2019-10-16 DIAGNOSIS — I10 Essential (primary) hypertension: Secondary | ICD-10-CM | POA: Diagnosis not present

## 2019-10-16 DIAGNOSIS — Z8679 Personal history of other diseases of the circulatory system: Secondary | ICD-10-CM

## 2019-10-16 DIAGNOSIS — R5383 Other fatigue: Secondary | ICD-10-CM | POA: Diagnosis not present

## 2019-10-16 DIAGNOSIS — E6609 Other obesity due to excess calories: Secondary | ICD-10-CM | POA: Insufficient documentation

## 2019-10-16 DIAGNOSIS — Z6831 Body mass index (BMI) 31.0-31.9, adult: Secondary | ICD-10-CM

## 2019-10-16 DIAGNOSIS — Z6830 Body mass index (BMI) 30.0-30.9, adult: Secondary | ICD-10-CM

## 2019-10-16 DIAGNOSIS — R0602 Shortness of breath: Secondary | ICD-10-CM | POA: Diagnosis not present

## 2019-10-16 DIAGNOSIS — Z1331 Encounter for screening for depression: Secondary | ICD-10-CM

## 2019-10-16 DIAGNOSIS — E038 Other specified hypothyroidism: Secondary | ICD-10-CM

## 2019-10-16 DIAGNOSIS — Z0289 Encounter for other administrative examinations: Secondary | ICD-10-CM

## 2019-10-16 DIAGNOSIS — E7849 Other hyperlipidemia: Secondary | ICD-10-CM

## 2019-10-16 DIAGNOSIS — E669 Obesity, unspecified: Secondary | ICD-10-CM

## 2019-10-16 NOTE — Progress Notes (Signed)
Dear Bethany Copa, PA,   Thank you for referring Bethany [name] to our clinic. The following note includes my evaluation and treatment recommendations.  Chief Complaint:   OBESITY Bethany [name] (MR# [id number]) is a 59 y.o. female who presents for evaluation and treatment of obesity and related comorbidities. Current BMI is Body mass index is 30.99 kg/m.Bethany [name] has been struggling with her weight for many years and has been unsuccessful in either losing weight, maintaining weight loss, or reaching her healthy weight goal.  Bethany [name] is currently in the action stage of change and ready to dedicate time achieving and maintaining a healthier weight. Bethany [name] is interested in becoming our patient and working on intensive lifestyle modifications including (but not limited to) diet and exercise for weight loss.  Bethany [name] states that she does not like to cook secondary to making a mess. She states that she is a "picky eater." She skips meals.  Bethany [name]'s habits were reviewed today and are as follows: Her family eats meals together, her desired weight loss is 27 lbs, she started gaining weight in 2005 after thyroid removal, her heaviest weight ever was 205 pounds, she is a picky eater and doesn't like to eat healthier foods, she craves chicken nuggets, cherries, and fries, she snacks frequently in the evenings, she skips breakfast and sometimes lunch, she is frequently drinking liquids with calories, she sometimes makes poor food choices, she sometimes has problems with excessive hunger, she sometimes eats larger portions than normal, she has binge eating behaviors and she struggles with emotional eating.  Depression Screen Bethany [name] (modified PHQ-9) score was 15.  Depression screen PHQ 2/9 10/16/2019  Decreased Interest 2  Down, Depressed, Hopeless 2  PHQ - 2 Score 4  Altered sleeping 1  Tired, decreased energy 2  Change in appetite 2  Feeling bad or failure about yourself  2  Trouble  concentrating 3  Moving slowly or fidgety/restless 1  Suicidal thoughts 0  PHQ-9 Score 15  Difficult doing work/chores Not difficult at all  Some recent data might be hidden   Subjective:   Other fatigue. Bethany [name] admits to daytime somnolence and admits to waking up still tired. Bethany [name] has a history of symptoms of daytime fatigue and Epworth sleepiness scale. Bethany [name] generally gets 6-7 hours of sleep per night, and states that she somewhat has restful sleep. Snoring is sometimes present. Apneic episodes are not present. Epworth Sleepiness Score is 12.  Shortness of breath on exertion. Bethany [name] notes increasing shortness of breath with certain activities and seems to be worsening over time with weight gain. She notes getting out of breath sooner with activity than she used to. This has gotten worse recently. Bethany [name] denies shortness of breath at rest or orthopnea.  Essential hypertension. Bethany [name] is taking Hyzaar. Blood pressure is controlled.  BP Readings from Last 3 Encounters:  10/16/19 116/76  10/12/19 (!) 134/88  09/20/19 (!) (P) 143/78   Lab Results  Component Value Date   CREATININE 0.86 10/12/2019   CREATININE 0.83 09/19/2019   CREATININE 0.83 06/30/2019   Vitamin D deficiency. Bethany [name] is taking a multivitamin.  Other hyperlipidemia. Bethany [name] is taking rosuvastatin.   Lab Results  Component Value Date   CHOL 159 09/19/2019   HDL 69 09/19/2019   LDLCALC 76 09/19/2019   TRIG 76 09/19/2019   CHOLHDL 2.3 09/19/2019   Lab Results  Component Value Date   ALT 27 10/12/2019   AST 20 10/12/2019   ALKPHOS 78 10/12/2019  BILITOT 0.4 10/12/2019   The 10-year ASCVD risk score Bethany [name] DC Jr., et al., 2013) is: 1.8%   Values used to calculate the score:     Age: 59 years     Sex: Female     Is Non-Hispanic African American: No     Diabetic: No     Tobacco smoker: No     Systolic Blood Pressure: 563 mmHg     Is BP treated: Yes     HDL Cholesterol: 69 mg/dL     Total Cholesterol:  159 mg/dL  History of unstable angina. Bethany [name] is taking ASA 81 mg daily. She had a stent placed in April 2021.  Other specified hypothyroidism. Bethany [name] is taking Synthroid.  Lab Results  Component Value Date   TSH 1.91 02/07/2019   Depression screening. Bethany [name] had a strongly positive depression screen with a PHQ-9 score of 15.  At risk for heart disease. Bethany [name] is at a higher than average risk for cardiovascular disease due to hypertension, hyperlipidemia, and obesity.   Assessment/Plan:   Other fatigue. Bethany [name] does feel that her weight is causing her energy to be lower than it should be. Fatigue may be related to obesity, depression or many other causes. Labs will be ordered, and in the meanwhile, Bethany [name] will focus on self care including making healthy food choices, increasing physical activity and focusing on stress reduction. EKG 12-Lead, Hemoglobin A1c, Insulin, random, T3, T4, free, TSH testing ordered today.  Shortness of breath on exertion. Bethany [name] does feel that she gets out of breath more easily that she used to when she exercises. Bethany [name]'s shortness of breath appears to be obesity related and exercise induced. She has agreed to work on weight loss and gradually increase exercise to treat her exercise induced shortness of breath. Will continue to monitor closely.  Essential hypertension. Bethany [name] is working on healthy weight loss and exercise to improve blood pressure control. We will watch for signs of hypotension as she continues her lifestyle modifications. She will continue her medication as directed.   Vitamin D deficiency. Low Vitamin D level contributes to fatigue and are associated with obesity, breast, and colon cancer. VITAMIN D 25 Hydroxy (Vit-D Deficiency, Fractures) level will be checked today.  Other hyperlipidemia. Cardiovascular risk and specific lipid/LDL goals reviewed.  We discussed several lifestyle modifications today and Bethany [name] will continue to work on diet,  exercise and weight loss efforts. Orders and follow up as documented in patient record. Lipid Panel With LDL/HDL Ratio will be checked today.  Counseling Intensive lifestyle modifications are the first line treatment for this issue. . Dietary changes: Increase soluble fiber. Decrease simple carbohydrates. . Exercise changes: Moderate to vigorous-intensity aerobic activity 150 minutes per week if tolerated. . Lipid-lowering medications: see documented in medical record.   History of unstable angina. Bethany [name] will follow-up with her PCP as scheduled and as directed. She will follow-up yearly with her cardiologist.  Other specified hypothyroidism. Patient with long-standing hypothyroidism, on levothyroxine therapy. She appears euthyroid. Orders and follow up as documented in patient record. Bethany [name] will continue Synthroid as directed. Thyroid panel will be checked today.  Counseling . Good thyroid control is important for overall health. Supratherapeutic thyroid levels are dangerous and will not improve weight loss results. . The correct way to take levothyroxine is fasting, with water, separated by at least 30 minutes from breakfast, and separated by more than 4 hours from calcium, iron, multivitamins, acid reflux medications (PPIs).   Depression screening. Bethany [name] had a positive depression screening.  Depression is commonly associated with obesity and often results in emotional eating behaviors. We will monitor this closely and work on CBT to help improve the non-hunger eating patterns. Referral to Psychology may be required if no improvement is seen as she continues in our clinic.  At risk for heart disease. Bethany [name] was given approximately 15 minutes of coronary artery disease prevention counseling today. She is 59 y.o. female and has risk factors for heart disease including obesity. We discussed intensive lifestyle modifications today with an emphasis on specific weight loss instructions and strategies.    Repetitive spaced learning was employed today to elicit superior memory formation and behavioral change.  Class 1 obesity with serious comorbidity and body mass index (BMI) of 31.0 to 31.9 in adult, unspecified obesity type.  Bethany [name] is currently in the action stage of change and her goal is to continue with weight loss efforts. I recommend Bethany [name] begin the structured treatment plan as follows:  She has agreed to the Category 2 Plan.  She will work on meal planning, intentional eating, and increasing her water intake.   We independently reviewed with the patient labs from 10/12/2019 including CMP, CBC, and glucose.  Exercise goals: All adults should avoid inactivity. Some physical activity is better than none, and adults who participate in any amount of physical activity gain some health benefits.   Behavioral modification strategies: increasing lean protein intake, decreasing simple carbohydrates, increasing vegetables, increasing water intake, decreasing eating out, no skipping meals, meal planning and cooking strategies, keeping healthy foods in the home and planning for success.  She was informed of the importance of frequent follow-up visits to maximize her success with intensive lifestyle modifications for her multiple health conditions. She was informed we would discuss her lab results at her next visit unless there is a critical issue that needs to be addressed sooner. Bethany [name] agreed to keep her next visit at the agreed upon time to discuss these results.  Objective:   Blood pressure 116/76, pulse 66, temperature 97.8 F (36.6 C), height 5\' 6"  (1.676 m), weight 192 lb (87.1 kg), SpO2 97 %. Body mass index is 30.99 kg/m.  EKG: Normal sinus rhythm, rate 63 BPM. Within normal limits.  Indirect Calorimeter completed today shows a VO2 of 217 and a REE of 1508.  Her calculated basal metabolic rate is 5366 thus her basal metabolic rate is worse than expected.  General: Cooperative,  alert, well developed, in no acute distress. HEENT: Conjunctivae and lids unremarkable. Cardiovascular: Regular rhythm.  Lungs: Normal work of breathing. Neurologic: No focal deficits.   Lab Results  Component Value Date   CREATININE 0.86 10/12/2019   BUN 17 10/12/2019   NA 140 10/12/2019   K 4.2 10/12/2019   CL 105 10/12/2019   CO2 27 10/12/2019   Lab Results  Component Value Date   ALT 27 10/12/2019   AST 20 10/12/2019   ALKPHOS 78 10/12/2019   BILITOT 0.4 10/12/2019   Lab Results  Component Value Date   HGBA1C 5.7 (H) 06/19/2014   No results found for: INSULIN Lab Results  Component Value Date   TSH 1.91 02/07/2019   Lab Results  Component Value Date   CHOL 159 09/19/2019   HDL 69 09/19/2019   LDLCALC 76 09/19/2019   TRIG 76 09/19/2019   CHOLHDL 2.3 09/19/2019   Lab Results  Component Value Date   WBC 5.5 10/12/2019   HGB 12.2 10/12/2019   HCT 38.0 10/12/2019   MCV 93.4 10/12/2019  PLT 262 10/12/2019   No results found for: IRON, TIBC, FERRITIN  Attestation Statements:   Reviewed by clinician on day of visit: allergies, medications, problem list, medical history, surgical history, family history, social history, and previous encounter notes.  Migdalia Dk, am acting as Location manager for [employer], DO   I have reviewed the above documentation for accuracy and completeness, and I agree with the above. Jearld Lesch, DO

## 2019-10-17 ENCOUNTER — Encounter (INDEPENDENT_AMBULATORY_CARE_PROVIDER_SITE_OTHER): Payer: Self-pay | Admitting: Bariatrics

## 2019-10-17 DIAGNOSIS — R7303 Prediabetes: Secondary | ICD-10-CM | POA: Insufficient documentation

## 2019-10-17 LAB — HEMOGLOBIN A1C
Est. average glucose Bld gHb Est-mCnc: 117 mg/dL
Hgb A1c MFr Bld: 5.7 % — ABNORMAL HIGH (ref 4.8–5.6)

## 2019-10-17 LAB — LIPID PANEL WITH LDL/HDL RATIO
Cholesterol, Total: 191 mg/dL (ref 100–199)
HDL: 73 mg/dL (ref >39)
LDL Chol Calc (NIH): 99 mg/dL (ref 0–99)
LDL/HDL Ratio: 1.4 ratio (ref 0.0–3.2)
Triglycerides: 107 mg/dL (ref 0–149)
VLDL Cholesterol Cal: 19 mg/dL (ref 5–40)

## 2019-10-17 LAB — INSULIN, RANDOM: INSULIN: 11.1 u[IU]/mL (ref 2.6–24.9)

## 2019-10-17 LAB — VITAMIN D 25 HYDROXY (VIT D DEFICIENCY, FRACTURES): Vit D, 25-Hydroxy: 28.1 ng/mL — ABNORMAL LOW (ref 30.0–100.0)

## 2019-10-17 LAB — T3: T3, Total: 121 ng/dL (ref 71–180)

## 2019-10-17 LAB — T4, FREE: Free T4: 1.32 ng/dL (ref 0.82–1.77)

## 2019-10-17 LAB — TSH: TSH: 1.02 u[IU]/mL (ref 0.450–4.500)

## 2019-10-24 ENCOUNTER — Encounter: Payer: Self-pay | Admitting: Orthopaedic Surgery

## 2019-10-24 ENCOUNTER — Ambulatory Visit: Payer: Self-pay

## 2019-10-24 ENCOUNTER — Ambulatory Visit (INDEPENDENT_AMBULATORY_CARE_PROVIDER_SITE_OTHER): Payer: 59 | Admitting: Orthopaedic Surgery

## 2019-10-24 DIAGNOSIS — M1812 Unilateral primary osteoarthritis of first carpometacarpal joint, left hand: Secondary | ICD-10-CM | POA: Diagnosis not present

## 2019-10-24 DIAGNOSIS — M1811 Unilateral primary osteoarthritis of first carpometacarpal joint, right hand: Secondary | ICD-10-CM

## 2019-10-24 NOTE — Progress Notes (Signed)
Office Visit Note   Patient: Bethany [name]           Date of Birth: 11-11-1960           MRN: [id number] Visit Date: 10/24/2019              Requested by: Leeroy Cha, MD [address] [place],  [place] [address] PCP: Leeroy Cha, MD   Assessment & Plan: Visit Diagnoses:  1. Primary osteoarthritis of first carpometacarpal joint of right hand   2. Primary osteoarthritis of first carpometacarpal joint of left hand     Plan: Impression is right greater than left basal joint arthritis.  After discussion of treatment options that include injections, bracing, continue NSAIDs, CMC arthroplasty and the risks and benefits and rehab and recovery the patient has elected to proceed with [facility] arthroplasty of the right hand in the near future.  Questions encouraged and answered.  We will call her in the near future to schedule this.  Follow-Up Instructions: Return if symptoms worsen or fail to improve.   Orders:  Orders Placed This Encounter  Procedures  . XR Hand Complete Right   No orders of the defined types were placed in this encounter.     Procedures: No procedures performed   Clinical Data: No additional findings.   Subjective: Chief Complaint  Patient presents with  . Left Thumb - Pain  . Right Thumb - Pain    Jeannie Done is a referral from Dr. Ninfa Linden for evaluation of bilateral thumb pain due to basal joint arthritis.  She is right-handed and makes jewelry for living.  In the last year she has had a bout with breast cancer and she had a cardiac stent placed but she did not have an MRI.  She is not had any injections but she has tried ibuprofen which gives temporary relief.   Review of Systems  Constitutional: Negative.   HENT: Negative.   Eyes: Negative.   Respiratory: Negative.   Cardiovascular: Negative.   Endocrine: Negative.   Musculoskeletal: Negative.   Neurological: Negative.   Hematological: Negative.     Psychiatric/Behavioral: Negative.   All other systems reviewed and are negative.    Objective: Vital Signs: There were no vitals taken for this visit.  Physical Exam Vitals and nursing note reviewed.  Constitutional:      Appearance: She is well-developed.  Pulmonary:     Effort: Pulmonary effort is normal.  Skin:    General: Skin is warm.     Capillary Refill: Capillary refill takes less than 2 seconds.  Neurological:     Mental Status: She is alert and oriented to person, place, and time.  Psychiatric:        Behavior: Behavior normal.        Thought Content: Thought content normal.        Judgment: Judgment normal.     Ortho Exam Bilateral hands show positive grind test worse on the right side.  No triggering.  No muscle atrophy.  No neurovascular compromise. Specialty Comments:  No specialty comments available.  Imaging: XR Hand Complete Right  Result Date: 10/24/2019 Advanced basal joint arthritis    PMFS History: Patient Active Problem List   Diagnosis Date Noted  . Primary osteoarthritis of first carpometacarpal joint of right hand 10/24/2019  . Primary osteoarthritis of first carpometacarpal joint of left hand 10/24/2019  . Prediabetes 10/17/2019  . Class 1 obesity due to excess calories with body mass index (BMI) of  30.0 to 30.9 in adult 10/16/2019  . Hypertension 06/30/2019  . Hyperlipidemia 06/30/2019  . Unstable angina ([place])   . History of thyroid cancer 12/15/2017  . 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 Ischemic chest pain ([place])   . Chest pain 06/19/2014  . Malignant neoplasm of upper-outer quadrant of left breast in female, estrogen receptor positive ([place])   . Vitamin D deficiency   . Urinary incontinence   . SYNCOPE AND COLLAPSE 12/25/2008   Past Medical History:  Diagnosis Date  . ADD (attention deficit disorder)   . Anxiety   . B12 deficiency   . Breast cancer ([place])    left breast cancer/diagnosed in 10/2017/taking radiation treatment  until 03/15/18  . Breast mass, left 11/2017   going thru radiation until 02/2018  . CAD (coronary artery disease)    a. DES to LAD 06/2019.  Marland Kitchen Chest pain 06/19/2014  . COPD (chronic obstructive pulmonary disease) ([place])    beginning stages/small scar  . Dental crowns present   . Depression   . Hiatal hernia   . History of hiatal hernia    no current med.  Marland Kitchen History of thyroid cancer 12/15/2017  . Hypertension    states under control with med., has been on med. x 1 yr.  . Hypothyroidism   . Malignant neoplasm of upper-outer quadrant of left breast in female, estrogen receptor positive ([place])   . Mild hyperlipidemia   . 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 MVP (mitral valve prolapse)   . Personal history of radiation therapy   . PONV (postoperative nausea and vomiting)   . 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 Syncope and collapse 12/25/2008   Qualifier: Diagnosis of  By: Philemon Kingdom    . Urinary incontinence    USI   . Vitamin D deficiency     Family History  Problem Relation Age of Onset  . Hypertension Father   . Heart disease Father   . Non-Hodgkin's lymphoma Father   . Cancer Father   . Depression Father   . Anxiety disorder Father   . Alcoholism Father   . Hypertension Brother   . Heart disease Paternal Grandfather   . Colon cancer Other        great maternal aunt   . Colon polyps Mother   . High Cholesterol Mother   . Alcoholism Mother   . Stomach cancer Neg Hx   . Rectal cancer Neg Hx   . Esophageal cancer Neg Hx     Past Surgical History:  Procedure Laterality Date  . ABDOMINAL HYSTERECTOMY     partial  . APPENDECTOMY    . AXILLARY SENTINEL NODE BIOPSY Left 12/09/2017   Procedure: AXILLARY SENTINEL NODE BIOPSY;  Surgeon: Erroll Luna, MD;  Location: [facility];  Service: General;  Laterality: Left;  . BREAST LUMPECTOMY Left 11/2017  . BREAST LUMPECTOMY WITH NEEDLE LOCALIZATION Left 11/25/2017   Procedure: LEFT BREAST LUMPECTOMY WITH  NEEDLE LOCALIZATION;  Surgeon: Erroll Luna, MD;  Location: [facility];  Service: General;  Laterality: Left;  . BREAST LUMPECTOMY WITH RADIOACTIVE SEED LOCALIZATION Right 09/20/2019   Procedure: RIGHT BREAST LUMPECTOMY WITH RADIOACTIVE SEED LOCALIZATION;  Surgeon: Erroll Luna, MD;  Location: [facility];  Service: General;  Laterality: Right;  . COLONOSCOPY WITH PROPOFOL  10/03/2015  . CORONARY STENT INTERVENTION N/A 06/29/2019   Procedure: CORONARY STENT INTERVENTION;  Surgeon: Burnell Blanks, MD;  Location: [facility];  Service: Cardiovascular;  Laterality: N/A;  . LAPAROSCOPIC APPENDECTOMY N/A 10/03/2015   Procedure: APPENDECTOMY LAPAROSCOPIC;  Surgeon: Rolm Bookbinder, MD;  Location: [facility];  Service: General;  Laterality: N/A;  . LEFT HEART CATH AND CORONARY ANGIOGRAPHY N/A 06/29/2019   Procedure: LEFT HEART CATH AND CORONARY ANGIOGRAPHY;  Surgeon: Burnell Blanks, MD;  Location: [facility];  Service: Cardiovascular;  Laterality: N/A;  . TOTAL THYROIDECTOMY  04/02/2003   Social History   Occupational History  . Occupation: stay at home spouse  Tobacco Use  . Smoking status: Former Smoker    Quit date: 08/14/2014    Years since quitting: 5.1  . Smokeless tobacco: Never Used  Vaping Use  . Vaping Use: Never used  Substance and Sexual Activity  . Alcohol use: Yes    Comment: rarely  . Drug use: No  . Sexual activity: Yes    Partners: Male    Birth control/protection: Surgical

## 2019-10-26 ENCOUNTER — Other Ambulatory Visit: Payer: Self-pay | Admitting: Physician Assistant

## 2019-10-27 ENCOUNTER — Other Ambulatory Visit: Payer: Self-pay | Admitting: Oncology

## 2019-10-27 DIAGNOSIS — Z853 Personal history of malignant neoplasm of breast: Secondary | ICD-10-CM

## 2019-10-30 ENCOUNTER — Encounter: Payer: Self-pay | Admitting: Cardiovascular Disease

## 2019-10-30 ENCOUNTER — Ambulatory Visit (INDEPENDENT_AMBULATORY_CARE_PROVIDER_SITE_OTHER): Payer: 59 | Admitting: Bariatrics

## 2019-10-30 ENCOUNTER — Other Ambulatory Visit: Payer: Self-pay

## 2019-10-30 ENCOUNTER — Ambulatory Visit (INDEPENDENT_AMBULATORY_CARE_PROVIDER_SITE_OTHER): Payer: 59 | Admitting: Cardiovascular Disease

## 2019-10-30 VITALS — BP 128/80 | HR 75 | Ht 66.0 in | Wt 197.0 lb

## 2019-10-30 DIAGNOSIS — I10 Essential (primary) hypertension: Secondary | ICD-10-CM | POA: Diagnosis not present

## 2019-10-30 DIAGNOSIS — E785 Hyperlipidemia, unspecified: Secondary | ICD-10-CM

## 2019-10-30 DIAGNOSIS — I25118 Atherosclerotic heart disease of native coronary artery with other forms of angina pectoris: Secondary | ICD-10-CM

## 2019-10-30 MED ORDER — CLOPIDOGREL BISULFATE 75 MG PO TABS
75.0000 mg | ORAL_TABLET | Freq: Every day | ORAL | 3 refills | Status: DC
Start: 2019-10-30 — End: 2021-07-03

## 2019-10-30 MED ORDER — ISOSORBIDE MONONITRATE ER 30 MG PO TB24: 15.0000 mg | ORAL_TABLET | Freq: Every day | ORAL | 3 refills | Status: DC

## 2019-10-30 NOTE — Progress Notes (Signed)
Chief Complaint  Patient presents with  . Follow-up    CAD   History of Present Illness: 59 yo female with history of CAD, breast cancer in 2019 s/p radiation therapy, hiatal hernia, thyroid cancer, hypothyroidism, HTN former tobacco abuse and anxiety here today for cardiac follow up. I saw her as a new patient for evaluation of chest pain 05/15/19.Marland Kitchen She was seen in the ED January 2021 with chest pain. Troponin was negative. EKG without ischemic changes. Normal EGD per Dr. Henrene Pastor 04/12/19 with no cause for chest pain isolated. She is known to have radiation induced lung fibrosis. Chest CT August 2020 with evidence of coronary calcification. I arranged a gated cardiac CTA on 06/20/19 and this showed a LAD stenosis that appears to be flow limiting. Echo 06/05/19 showed normal LV systolic function and no valve disease She has been diagnosed with breast cancer in 2019 and has been undergoing radiation therapy with plans for lumpectomy on 06/29/19.  Cardiac cath 06/29/19 with severe mid LAD stenosis treated with a drug eluting stent. She has undergone breast lumpectomy.   She is here today for follow up. The patient denies any dyspnea, palpitations, lower extremity edema, orthopnea, PND, dizziness, near syncope or syncope. She has had several episodes of chest pain over the past few weeks. This lasts for several minutes. Some dyspnea.   Primary Care Physician: Leeroy Cha, MD   Past Medical History:  Diagnosis Date  . ADD (attention deficit disorder)   . Anxiety   . B12 deficiency   . Breast cancer ([place])    left breast cancer/diagnosed in 10/2017/taking radiation treatment until 03/15/18  . Breast mass, left 11/2017   going thru radiation until 02/2018  . CAD (coronary artery disease)    a. DES to LAD 06/2019.  Marland Kitchen Chest pain 06/19/2014  . COPD (chronic obstructive pulmonary disease) ([place])    beginning stages/small scar  . Dental crowns present   . Depression   . Hiatal hernia   . History of  hiatal hernia    no current med.  Marland Kitchen History of thyroid cancer 12/15/2017  . Hypertension    states under control with med., has been on med. x 1 yr.  . Hypothyroidism   . Malignant neoplasm of upper-outer quadrant of left breast in female, estrogen receptor positive ([place])   . Mild hyperlipidemia   . 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 MVP (mitral valve prolapse)   . Personal history of radiation therapy   . PONV (postoperative nausea and vomiting)   . Radiation fibrosis of lung ([place])   . Syncope and collapse 12/25/2008   Qualifier: Diagnosis of  By: Philemon Kingdom    . Urinary incontinence    USI   . Vitamin D deficiency     Past Surgical History:  Procedure Laterality Date  . ABDOMINAL HYSTERECTOMY     partial  . APPENDECTOMY    . AXILLARY SENTINEL NODE BIOPSY Left 12/09/2017   Procedure: AXILLARY SENTINEL NODE BIOPSY;  Surgeon: Erroll Luna, MD;  Location: [facility];  Service: General;  Laterality: Left;  . BREAST LUMPECTOMY Left 11/2017  . BREAST LUMPECTOMY WITH NEEDLE LOCALIZATION Left 11/25/2017   Procedure: LEFT BREAST LUMPECTOMY WITH NEEDLE LOCALIZATION;  Surgeon: Erroll Luna, MD;  Location: [facility];  Service: General;  Laterality: Left;  . BREAST LUMPECTOMY WITH RADIOACTIVE SEED LOCALIZATION Right 09/20/2019   Procedure: RIGHT BREAST LUMPECTOMY WITH RADIOACTIVE SEED LOCALIZATION;  Surgeon: Erroll Luna, MD;  Location: [facility];  Service: General;  Laterality: Right;  . COLONOSCOPY WITH PROPOFOL  10/03/2015  . CORONARY STENT INTERVENTION N/A 06/29/2019   Procedure: CORONARY STENT INTERVENTION;  Surgeon: Burnell Blanks, MD;  Location: [facility];  Service: Cardiovascular;  Laterality: N/A;  . LAPAROSCOPIC APPENDECTOMY N/A 10/03/2015   Procedure: APPENDECTOMY LAPAROSCOPIC;  Surgeon: Rolm Bookbinder, MD;  Location: [facility];  Service: General;  Laterality: N/A;  . LEFT HEART CATH AND CORONARY ANGIOGRAPHY  N/A 06/29/2019   Procedure: LEFT HEART CATH AND CORONARY ANGIOGRAPHY;  Surgeon: Burnell Blanks, MD;  Location: [facility];  Service: Cardiovascular;  Laterality: N/A;  . TOTAL THYROIDECTOMY  04/02/2003    Current Outpatient Medications  Medication Sig Dispense Refill  . albuterol (VENTOLIN HFA) 108 (90 Base) MCG/ACT inhaler Inhale 2 puffs into the lungs every 6 (six) hours as needed for wheezing or shortness of breath. 18 g 6  . aspirin EC 81 MG tablet Take 81 mg by mouth daily.    . ergocalciferol (VITAMIN D2) 50000 UNITS capsule Take 50,000 Units by mouth once a week.     . levothyroxine (SYNTHROID, LEVOTHROID) 137 MCG tablet Take 137 mcg by mouth at bedtime.     Marland Kitchen losartan-hydrochlorothiazide (HYZAAR) 50-12.5 MG tablet Take 1 tablet by mouth daily.    . Multiple Vitamin (MULTIVITAMIN WITH MINERALS) TABS tablet Take 1 tablet by mouth daily.    . nitroGLYCERIN (NITROSTAT) 0.4 MG SL tablet Place 1 tablet (0.4 mg total) under the tongue every 5 (five) minutes as needed for chest pain. 25 tablet 3  . rosuvastatin (CRESTOR) 40 MG tablet Take 1 tablet (40 mg total) by mouth daily. 90 tablet 3  . venlafaxine XR (EFFEXOR-XR) 150 MG 24 hr capsule TAKE 1 CAPSULE (150 MG) BY MOUTH ONCE A DAY WITH BREAKFAST 90 capsule 5  . clopidogrel (PLAVIX) 75 MG tablet Take 1 tablet (75 mg total) by mouth daily. 90 tablet 3  . isosorbide mononitrate (IMDUR) 30 MG 24 hr tablet Take 0.5 tablets (15 mg total) by mouth daily. 45 tablet 3   No current facility-administered medications for this visit.    Allergies  Allergen Reactions  . Oxycodone Nausea And Vomiting  . Latex Rash  . Other Rash    "Sutures caused severe rash."  . Sulfa Antibiotics     hives    Social History   Socioeconomic History  . Marital status: Married    Spouse name: Patrick Jupiter  . Number of children: 3  . Years of education: Not on file  . Highest education level: Not on file  Occupational History  . Occupation: stay at  home spouse  Tobacco Use  . Smoking status: Former Smoker    Quit date: 08/14/2014    Years since quitting: 5.2  . Smokeless tobacco: Never Used  Vaping Use  . Vaping Use: Never used  Substance and Sexual Activity  . Alcohol use: Yes    Comment: rarely  . Drug use: No  . Sexual activity: Yes    Partners: Male    Birth control/protection: Surgical  Other Topics Concern  . Not on file  Social History Narrative  . Not on file   Social Determinants of Health   Financial Resource Strain:   . Difficulty of Paying Living Expenses:   Food Insecurity:   . Worried About Charity fundraiser in the Last Year:   . Arboriculturist in the Last Year:   Transportation Needs:   . Lack  of Transportation (Medical):   Marland Kitchen Lack of Transportation (Non-Medical):   Physical Activity:   . Days of Exercise per Week:   . Minutes of Exercise per Session:   Stress:   . Feeling of Stress :   Social Connections:   . Frequency of Communication with Friends and Family:   . Frequency of Social Gatherings with Friends and Family:   . Attends Religious Services:   . Active Member of Clubs or Organizations:   . Attends Archivist Meetings:   Marland Kitchen Marital Status:   Intimate Partner Violence:   . Fear of Current or Ex-Partner:   . Emotionally Abused:   Marland Kitchen Physically Abused:   . Sexually Abused:     Family History  Problem Relation Age of Onset  . Hypertension Father   . Heart disease Father   . Non-Hodgkin's lymphoma Father   . Cancer Father   . Depression Father   . Anxiety disorder Father   . Alcoholism Father   . Hypertension Brother   . Heart disease Paternal Grandfather   . Colon cancer Other        great maternal aunt   . Colon polyps Mother   . High Cholesterol Mother   . Alcoholism Mother   . Stomach cancer Neg Hx   . Rectal cancer Neg Hx   . Esophageal cancer Neg Hx     Review of Systems:  As stated in the HPI and otherwise negative.   BP 128/80   Pulse 75   Ht 5\' 6"   (1.676 m)   Wt 197 lb (89.4 kg)   SpO2 96%   BMI 31.80 kg/m   Physical Examination: General: Well developed, well nourished, NAD  HEENT: OP clear, mucus membranes moist  SKIN: warm, dry. No rashes. Neuro: No focal deficits  Musculoskeletal: Muscle strength 5/5 all ext  Psychiatric: Mood and affect normal  Neck: No JVD, no carotid bruits, no thyromegaly, no lymphadenopathy.  Lungs:Clear bilaterally, no wheezes, rhonci, crackles Cardiovascular: Regular rate and rhythm. No murmurs, gallops or rubs. Abdomen:Soft. Bowel sounds present. Non-tender.  Extremities: No lower extremity edema. Pulses are 2 + in the bilateral DP/PT.  EKG:  EKG is not ordered today. The ekg ordered today demonstrates  Echo 06/05/19: 1. Left ventricular ejection fraction, by estimation, is 60 to 65%. The  left ventricle has normal function. The left ventricle has no regional  wall motion abnormalities. There is mild asymmetric left ventricular  hypertrophy. Left ventricular diastolic  parameters were normal.  2. Right ventricular systolic function is normal. The right ventricular  size is normal. There is mildly elevated pulmonary artery systolic  pressure.  3. The mitral valve is normal in structure. No evidence of mitral valve  regurgitation. No evidence of mitral stenosis.  4. The aortic valve is normal in structure. Aortic valve regurgitation is  not visualized. No aortic stenosis is present.   Cardiac cath 06/29/19: Left Anterior Descending  Vessel is large.  Mid LAD lesion is 80% stenosed.  Left Circumflex  Vessel is large.  Right Coronary Artery  Vessel is large.  Intervention    Recent Labs: 10/12/2019: ALT 27; BUN 17; Creatinine, Ser 0.86; Hemoglobin 12.2; Platelets 262; Potassium 4.2; Sodium 140 10/16/2019: TSH 1.020   Lipid Panel    Component Value Date/Time   CHOL 191 10/16/2019 1246   TRIG 107 10/16/2019 1246   HDL 73 10/16/2019 1246   CHOLHDL 2.3 09/19/2019 1155   CHOLHDL 3.7  06/19/2014 2234   VLDL  18 06/19/2014 2234   [place] 99 10/16/2019 1246     Wt Readings from Last 3 Encounters:  10/30/19 197 lb (89.4 kg)  10/16/19 192 lb (87.1 kg)  10/12/19 195 lb 12.8 oz (88.8 kg)     Other studies Reviewed: Additional studies/ records that were reviewed today include:  Review of the above records demonstrates:    Assessment and Plan:   1. CAD without angina: She is doing well. Rare chest pains. Continue ASA and statin. She is off of Plavix following recent surgery. I would resume Plavix now. Will also start Imdur 15 mg daily.   2. HTN: BP is controlled. No changes  3. Hyperlipidemia: Continue statin.   Current medicines are reviewed at length with the patient today.  The patient does not have concerns regarding medicines.  The following changes have been made:  no change  Labs/ tests ordered today include:   No orders of the defined types were placed in this encounter.  Disposition:   FU with me in 6 months.     Signed, [name], MD 10/30/2019 3:15 PM    [facility] [address], [place], [place]  [address] Phone: [phone]; Fax: [phone]

## 2019-10-30 NOTE — Patient Instructions (Signed)
Medication Instructions:  1) RESTART PLAVIX 75 mg daily 2) START ISOSORBIDE (Imdur) 15 mg daily *If you need a refill on your cardiac medications before your next appointment, please call your pharmacy*  Follow-Up: At [facility], you and your health needs are our priority.  As part of our continuing mission to provide you with exceptional heart care, we have created designated Provider Care Teams.  These Care Teams include your primary Cardiologist (physician) and Advanced Practice Providers (APPs -  Physician Assistants and Nurse Practitioners) who all work together to provide you with the care you need, when you need it. Your next appointment:   6 month(s) The format for your next appointment:   In Person Provider:   You may see [name], MD or one of the following Advanced Practice Providers on your designated Care Team:    Richardson Dopp, PA-C  [place], [place]

## 2019-10-31 ENCOUNTER — Other Ambulatory Visit: Payer: Self-pay

## 2019-10-31 ENCOUNTER — Ambulatory Visit (INDEPENDENT_AMBULATORY_CARE_PROVIDER_SITE_OTHER): Payer: 59 | Admitting: Bariatrics

## 2019-10-31 ENCOUNTER — Encounter (INDEPENDENT_AMBULATORY_CARE_PROVIDER_SITE_OTHER): Payer: Self-pay | Admitting: Bariatrics

## 2019-10-31 VITALS — BP 134/88 | HR 77 | Temp 97.9°F

## 2019-10-31 DIAGNOSIS — Z6830 Body mass index (BMI) 30.0-30.9, adult: Secondary | ICD-10-CM

## 2019-10-31 DIAGNOSIS — E6609 Other obesity due to excess calories: Secondary | ICD-10-CM | POA: Diagnosis not present

## 2019-10-31 DIAGNOSIS — Z9189 Other specified personal risk factors, not elsewhere classified: Secondary | ICD-10-CM

## 2019-10-31 DIAGNOSIS — E559 Vitamin D deficiency, unspecified: Secondary | ICD-10-CM | POA: Diagnosis not present

## 2019-10-31 DIAGNOSIS — R7303 Prediabetes: Secondary | ICD-10-CM | POA: Diagnosis not present

## 2019-10-31 MED ORDER — VITAMIN D (ERGOCALCIFEROL) 1.25 MG (50000 UNIT) PO CAPS: 50000.0000 [IU] | ORAL_CAPSULE | ORAL | 0 refills | Status: DC

## 2019-10-31 MED ORDER — METFORMIN HCL 500 MG PO TABS: 500.0000 mg | ORAL_TABLET | Freq: Every day | ORAL | 0 refills | Status: DC

## 2019-11-01 ENCOUNTER — Encounter (INDEPENDENT_AMBULATORY_CARE_PROVIDER_SITE_OTHER): Payer: Self-pay | Admitting: Bariatrics

## 2019-11-01 NOTE — Progress Notes (Signed)
Chief Complaint:   OBESITY Bethany [name] is here to discuss her progress with her obesity treatment plan along with follow-up of her obesity related diagnoses. Bethany [name] is on the Category 2 Plan and states she is following her eating plan approximately 40% of the time. Bethany [name] states she is exercising 0 minutes 0 times per week.  Today's visit was #: 2 Starting weight: 192 lbs Starting date: 10/16/2019 Today's weight: 191 lbs Today's date: 10/31/2019 Total lbs lost to date: 1 Total lbs lost since last in-office visit: 1  Interim History: Bethany [name] lost 1 lb since her initial visit. She only did the plan about 40% as her son was visiting and she was cooking and going out.  Subjective:   Prediabetes. Bethany [name] has a diagnosis of prediabetes based on her elevated HgA1c and was informed this puts her at greater risk of developing diabetes. She continues to work on diet and exercise to decrease her risk of diabetes. She denies nausea or hypoglycemia. No polyphagia.  Lab Results  Component Value Date   HGBA1C 5.7 (H) 10/16/2019   Lab Results  Component Value Date   INSULIN 11.1 10/16/2019   Vitamin D deficiency. Bethany [name] is taking Vitamin D2 weekly.   Ref. Range 10/16/2019 12:46  Vitamin D, 25-Hydroxy Latest Ref Range: 30.0 - 100.0 ng/mL 28.1 (L)   At risk for osteoporosis. Bethany [name] is at higher risk of osteopenia and osteoporosis due to Vitamin D deficiency.   Assessment/Plan:   Prediabetes. Bethany [name] will continue to work on weight loss, exercise, increasing healthy fats and protein, and decreasing simple carbohydrates to help decrease the risk of diabetes. Handout was provided on Insulin Resistance and Prediabetes. Prescription was given for metFORMIN (GLUCOPHAGE) 500 MG tablet 1 daily with food at lunch #30 with 0 refills.  Vitamin D deficiency. Low Vitamin D level contributes to fatigue and are associated with obesity, breast, and colon cancer. She was given a prescription for Vitamin D,  Ergocalciferol, (DRISDOL) 1.25 MG (50000 UNIT) CAPS capsule every week #4 with 0 refills. and will follow-up for routine testing of Vitamin D, at least 2-3 times per year to avoid over-replacement.   At risk for osteoporosis. Bethany [name] was given approximately 15 minutes of osteoporosis prevention counseling today. Bethany [name] is at risk for osteopenia and osteoporosis due to her Vitamin D deficiency. She was encouraged to take her Vitamin D and follow her higher calcium diet and increase strengthening exercise to help strengthen her bones and decrease her risk of osteopenia and osteoporosis.  Repetitive spaced learning was employed today to elicit superior memory formation and behavioral change.  Class 1 obesity due to excess calories with serious comorbidity and body mass index (BMI) of 30.0 to 30.9 in adult.  Bethany [name] is currently in the action stage of change. As such, her goal is to continue with weight loss efforts. She has agreed to the Category 2 Plan.   She will work on meal planning, intentional eating, and increasing her water intake. Options for hunger were given, i.e., raw vegetables.  We independently reviewed with the patient labs from 10/16/2019 including lipids, Vitamin D, A1c, insulin, and thyroid panel.  Exercise goals: All adults should avoid inactivity. Some physical activity is better than none, and adults who participate in any amount of physical activity gain some health benefits.  Behavioral modification strategies: increasing lean protein intake, decreasing simple carbohydrates, increasing vegetables, increasing water intake, decreasing eating out, no skipping meals, meal planning and cooking strategies, keeping healthy foods in the  home and planning for success.  Bethany [name] has agreed to follow-up with our clinic in 2 weeks. She was informed of the importance of frequent follow-up visits to maximize her success with intensive lifestyle modifications for her multiple health conditions.     Objective:   Blood pressure 134/88, pulse 77, temperature 97.9 F (36.6 C), height (P) 5\' 6"  (1.676 m), weight (P) 191 lb (86.6 kg), SpO2 98 %. Body mass index is 30.83 kg/m (pended).  General: Cooperative, alert, well developed, in no acute distress. HEENT: Conjunctivae and lids unremarkable. Cardiovascular: Regular rhythm.  Lungs: Normal work of breathing. Neurologic: No focal deficits.   Lab Results  Component Value Date   CREATININE 0.86 10/12/2019   BUN 17 10/12/2019   NA 140 10/12/2019   K 4.2 10/12/2019   CL 105 10/12/2019   CO2 27 10/12/2019   Lab Results  Component Value Date   ALT 27 10/12/2019   AST 20 10/12/2019   ALKPHOS 78 10/12/2019   BILITOT 0.4 10/12/2019   Lab Results  Component Value Date   HGBA1C 5.7 (H) 10/16/2019   HGBA1C 5.7 (H) 06/19/2014   Lab Results  Component Value Date   INSULIN 11.1 10/16/2019   Lab Results  Component Value Date   TSH 1.020 10/16/2019   Lab Results  Component Value Date   CHOL 191 10/16/2019   HDL 73 10/16/2019   LDLCALC 99 10/16/2019   TRIG 107 10/16/2019   CHOLHDL 2.3 09/19/2019   Lab Results  Component Value Date   WBC 5.5 10/12/2019   HGB 12.2 10/12/2019   HCT 38.0 10/12/2019   MCV 93.4 10/12/2019   PLT 262 10/12/2019   No results found for: IRON, TIBC, FERRITIN  Attestation Statements:   Reviewed by clinician on day of visit: allergies, medications, problem list, medical history, surgical history, family history, social history, and previous encounter notes.  Bethany [name], am acting as Location manager for [employer], DO   I have reviewed the above documentation for accuracy and completeness, and I agree with the above. Jearld Lesch, DO

## 2019-11-16 ENCOUNTER — Ambulatory Visit (INDEPENDENT_AMBULATORY_CARE_PROVIDER_SITE_OTHER): Payer: 59 | Admitting: Bariatrics

## 2019-11-16 ENCOUNTER — Encounter (INDEPENDENT_AMBULATORY_CARE_PROVIDER_SITE_OTHER): Payer: Self-pay | Admitting: Bariatrics

## 2019-11-16 ENCOUNTER — Other Ambulatory Visit: Payer: Self-pay

## 2019-11-16 VITALS — BP 134/92 | HR 77 | Temp 98.8°F | Ht 66.0 in | Wt 194.0 lb

## 2019-11-16 DIAGNOSIS — F3289 Other specified depressive episodes: Secondary | ICD-10-CM

## 2019-11-16 DIAGNOSIS — R7303 Prediabetes: Secondary | ICD-10-CM

## 2019-11-16 DIAGNOSIS — E6609 Other obesity due to excess calories: Secondary | ICD-10-CM

## 2019-11-16 DIAGNOSIS — E559 Vitamin D deficiency, unspecified: Secondary | ICD-10-CM

## 2019-11-16 DIAGNOSIS — Z6830 Body mass index (BMI) 30.0-30.9, adult: Secondary | ICD-10-CM

## 2019-11-16 DIAGNOSIS — E7849 Other hyperlipidemia: Secondary | ICD-10-CM | POA: Diagnosis not present

## 2019-11-16 DIAGNOSIS — Z9189 Other specified personal risk factors, not elsewhere classified: Secondary | ICD-10-CM

## 2019-11-16 MED ORDER — TROKENDI XR 50 MG PO CP24: ORAL_CAPSULE | ORAL | 0 refills | Status: DC

## 2019-11-16 MED ORDER — TROKENDI XR 50 MG PO CP24: 50.0000 mg | ORAL_CAPSULE | Freq: Every day | ORAL | 0 refills | Status: DC

## 2019-11-16 MED ORDER — VITAMIN D (ERGOCALCIFEROL) 1.25 MG (50000 UNIT) PO CAPS: 50000.0000 [IU] | ORAL_CAPSULE | ORAL | 0 refills | Status: DC

## 2019-11-21 ENCOUNTER — Encounter (INDEPENDENT_AMBULATORY_CARE_PROVIDER_SITE_OTHER): Payer: Self-pay | Admitting: Bariatrics

## 2019-11-21 NOTE — Progress Notes (Signed)
Chief Complaint:   OBESITY Bethany [name] is here to discuss her progress with her obesity treatment plan along with follow-up of her obesity related diagnoses. [name] is on the Category 2 Plan and states she is following her eating plan approximately 60% of the time. [name] states she is walking 20 minutes 2 times per week.  Today's visit was #: 3 Starting weight: 192 lbs Starting date: 10/16/2019 Today's weight: 195 lbs Today's date: 11/16/2019 Total lbs lost to date: 0 Total lbs lost since last in-office visit: 0  Interim History: [name] is up 4 lbs since her last visit.  Subjective:   Vitamin D deficiency. [name] is on Vitamin D supplementation and denies side effects.   Ref. Range 10/16/2019 12:46  Vitamin D, 25-Hydroxy Latest Ref Range: 30.0 - 100.0 ng/mL 28.1 (L)   Other hyperlipidemia. [name] is taking Crestor.  Lab Results  Component Value Date   CHOL 191 10/16/2019   HDL 73 10/16/2019   LDLCALC 99 10/16/2019   TRIG 107 10/16/2019   CHOLHDL 2.3 09/19/2019   Lab Results  Component Value Date   ALT 27 10/12/2019   AST 20 10/12/2019   ALKPHOS 78 10/12/2019   BILITOT 0.4 10/12/2019   The 10-year ASCVD risk score [name], et al., 2013) is: 2.7%   Values used to calculate the score:     Age: 59 years     Sex: Female     Is Non-Hispanic African American: No     Diabetic: No     Tobacco smoker: No     Systolic Blood Pressure: 431 mmHg     Is BP treated: Yes     HDL Cholesterol: 73 mg/dL     Total Cholesterol: 191 mg/dL  Prediabetes. [name] has a diagnosis of prediabetes based on her elevated HgA1c and was informed this puts her at greater risk of developing diabetes. She continues to work on diet and exercise to decrease her risk of diabetes. She denies nausea or hypoglycemia. [name] was put on metformin, but could not tolerate it.  Lab Results  Component Value Date   HGBA1C 5.7 (H) 10/16/2019   Lab Results  Component Value Date   INSULIN 11.1  10/16/2019   Other depression, with emotional eating. [name] is struggling with emotional eating and using food for comfort to the extent that it is negatively impacting her health. She has been working on behavior modification techniques to help reduce her emotional eating and has been somewhat successful. She shows no sign of suicidal or homicidal ideations. [name] endorses stress eating. She denies history of kidney stones or glaucoma.  At risk for dehydration. [name] is at increased risk for dehydration due to warm weather and walking.  Assessment/Plan:   Vitamin D deficiency. Low Vitamin D level contributes to fatigue and are associated with obesity, breast, and colon cancer. She was given a prescription for Vitamin D, Ergocalciferol, (DRISDOL) 1.25 MG (50000 UNIT) CAPS capsule every week #4 with 0 refills and will follow-up for routine testing of Vitamin D, at least 2-3 times per year to avoid over-replacement.   Other hyperlipidemia. Cardiovascular risk and specific lipid/LDL goals reviewed.  We discussed several lifestyle modifications today and Lurline will continue to work on diet, exercise and weight loss efforts. Orders and follow up as documented in patient record. [name] will avoid trans fats, keep her saturated fat intake for a minimum, and increase MUFA's. Omega 6/Omega 3 ratio should be 4/1.  Counseling Intensive lifestyle modifications  are the first line treatment for this issue. . Dietary changes: Increase soluble fiber. Decrease simple carbohydrates. . Exercise changes: Moderate to vigorous-intensity aerobic activity 150 minutes per week if tolerated.  . Lipid-lowering medications: see documented in medical record.  Prediabetes. [name] will continue to work on weight loss, exercise, and decreasing simple carbohydrates to help decrease the risk of diabetes. She has stopped taking the metformin.  Other depression, with emotional eating. Behavior modification techniques were  discussed today to help [name] deal with her emotional/non-hunger eating behaviors.  Orders and follow up as documented in patient record. Prescription was given for Topiramate ER (TROKENDI XR) 50 MG CP24 1 PO daily #30 with 0 refills. Stacyann reports Trokendi makes soda and sparkling water taste weird (flat).  At risk for dehydration. [name] was given approximately 15 minutes dehydration prevention counseling today. [name] is at risk for dehydration due to weight loss and current medication(s). She was encouraged to hydrate and monitor fluid status to avoid dehydration as well as weight loss plateaus. She will walk in the a.m.  Class 1 obesity due to excess calories with serious comorbidity and body mass index (BMI) of 30.0 to 30.9 in adult.  [name] is currently in the action stage of change. As such, her goal is to continue with weight loss efforts. She has agreed to the Category 2 Plan.   She will work on [employer], especially for dinner, and will work on intentional eating.  Exercise goals: All adults should avoid inactivity. Some physical activity is better than none, and adults who participate in any amount of physical activity gain some health benefits.  Behavioral modification strategies: increasing lean protein intake, decreasing simple carbohydrates, increasing vegetables, increasing water intake, decreasing sodium intake, increasing high fiber foods, better snacking choices and planning for success.  [name] has agreed to follow-up with our clinic in 2-3 weeks. She was informed of the importance of frequent follow-up visits to maximize her success with intensive lifestyle modifications for her multiple health conditions.   Objective:   Blood pressure (!) 134/92, pulse 77, temperature 98.8 F (37.1 C), height 5\' 6"  (1.676 m), weight 194 lb (88 kg), SpO2 96 %. Body mass index is 31.31 kg/m.  General: Cooperative, alert, well developed, in no acute distress. HEENT: Conjunctivae and  lids unremarkable. Cardiovascular: Regular rhythm.  Lungs: Normal work of breathing. Neurologic: No focal deficits.   Lab Results  Component Value Date   CREATININE 0.86 10/12/2019   BUN 17 10/12/2019   NA 140 10/12/2019   K 4.2 10/12/2019   CL 105 10/12/2019   CO2 27 10/12/2019   Lab Results  Component Value Date   ALT 27 10/12/2019   AST 20 10/12/2019   ALKPHOS 78 10/12/2019   BILITOT 0.4 10/12/2019   Lab Results  Component Value Date   HGBA1C 5.7 (H) 10/16/2019   HGBA1C 5.7 (H) 06/19/2014   Lab Results  Component Value Date   INSULIN 11.1 10/16/2019   Lab Results  Component Value Date   TSH 1.020 10/16/2019   Lab Results  Component Value Date   CHOL 191 10/16/2019   HDL 73 10/16/2019   LDLCALC 99 10/16/2019   TRIG 107 10/16/2019   CHOLHDL 2.3 09/19/2019   Lab Results  Component Value Date   WBC 5.5 10/12/2019   HGB 12.2 10/12/2019   HCT 38.0 10/12/2019   MCV 93.4 10/12/2019   PLT 262 10/12/2019   No results found for: IRON, TIBC, FERRITIN  Attestation Statements:   Reviewed  by clinician on day of visit: allergies, medications, problem list, medical history, surgical history, family history, social history, and previous encounter notes.  Migdalia Dk, am acting as Location manager for [employer], DO   I have reviewed the above documentation for accuracy and completeness, and I agree with the above. Jearld Lesch, DO

## 2019-11-22 ENCOUNTER — Other Ambulatory Visit (INDEPENDENT_AMBULATORY_CARE_PROVIDER_SITE_OTHER): Payer: Self-pay | Admitting: Bariatrics

## 2019-11-22 DIAGNOSIS — E559 Vitamin D deficiency, unspecified: Secondary | ICD-10-CM

## 2019-11-23 ENCOUNTER — Encounter (INDEPENDENT_AMBULATORY_CARE_PROVIDER_SITE_OTHER): Payer: Self-pay | Admitting: Bariatrics

## 2019-11-24 ENCOUNTER — Other Ambulatory Visit (INDEPENDENT_AMBULATORY_CARE_PROVIDER_SITE_OTHER): Payer: Self-pay | Admitting: Bariatrics

## 2019-11-24 DIAGNOSIS — R7303 Prediabetes: Secondary | ICD-10-CM

## 2019-11-27 NOTE — Telephone Encounter (Signed)
Please review

## 2019-11-29 ENCOUNTER — Ambulatory Visit
Admission: RE | Admit: 2019-11-29 | Discharge: 2019-11-29 | Disposition: A | Discharge status: Home / Self Care | Payer: 59 | Source: Ambulatory Visit | Attending: Oncology | Admitting: Oncology

## 2019-11-29 ENCOUNTER — Other Ambulatory Visit: Payer: Self-pay

## 2019-11-29 ENCOUNTER — Other Ambulatory Visit: Payer: Self-pay | Admitting: Oncology

## 2019-11-29 DIAGNOSIS — Z853 Personal history of malignant neoplasm of breast: Secondary | ICD-10-CM

## 2019-11-29 DIAGNOSIS — R921 Mammographic calcification found on diagnostic imaging of breast: Secondary | ICD-10-CM

## 2019-11-29 IMAGING — MG DIGITAL DIAGNOSTIC BILAT W/ TOMO W/ CAD
8 of 12 series · 8 of 28 positions shown · non-contrast
Comparison: Previous exam(s).

CLINICAL DATA: History of left breast cancer status post lumpectomy
in [CS].

EXAM:
DIGITAL DIAGNOSTIC BILATERAL MAMMOGRAM WITH TOMO AND CAD

[L MLO]
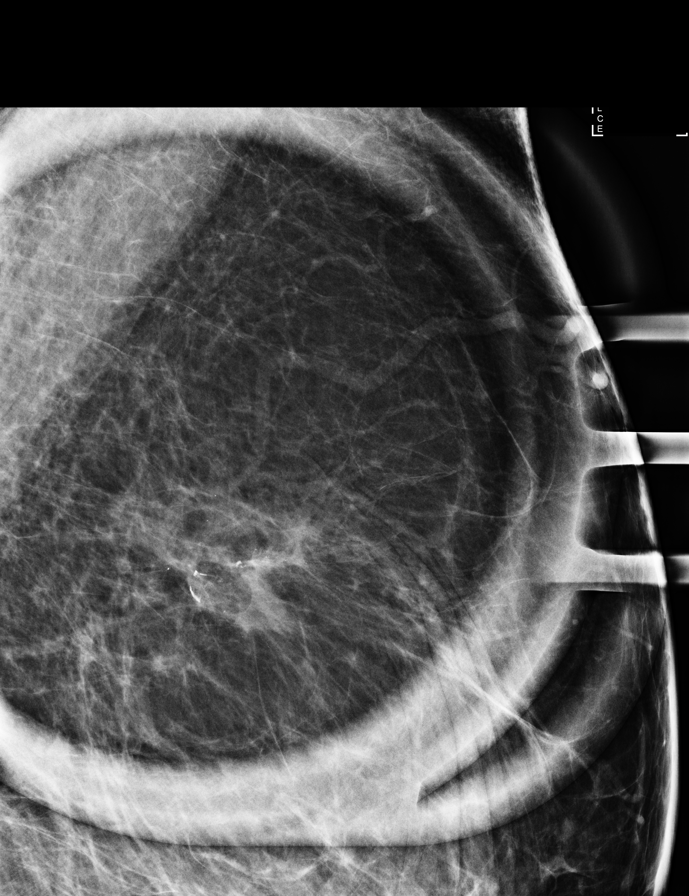

[L CC]
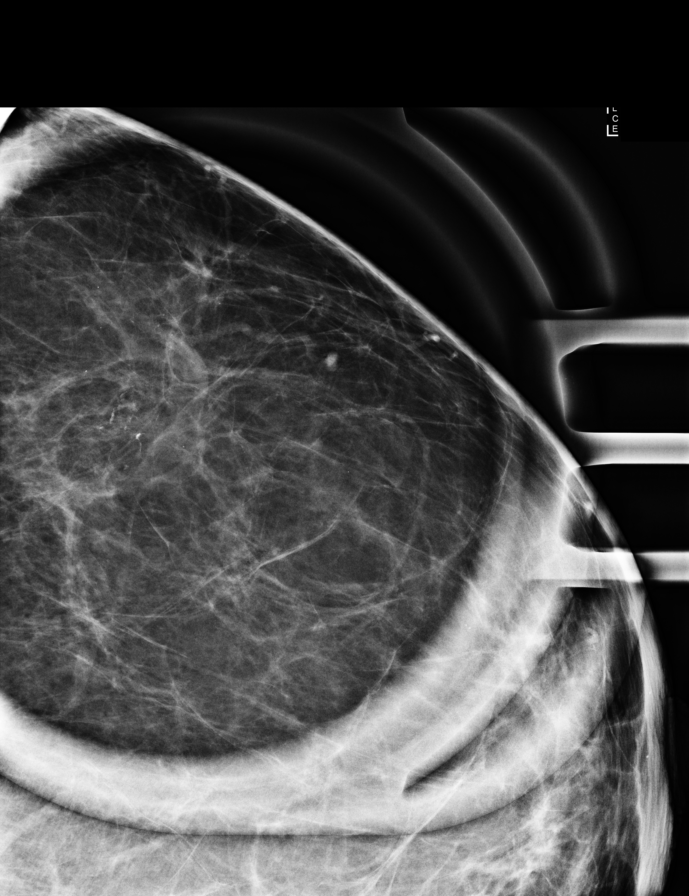

[L ML (1 of 2)]
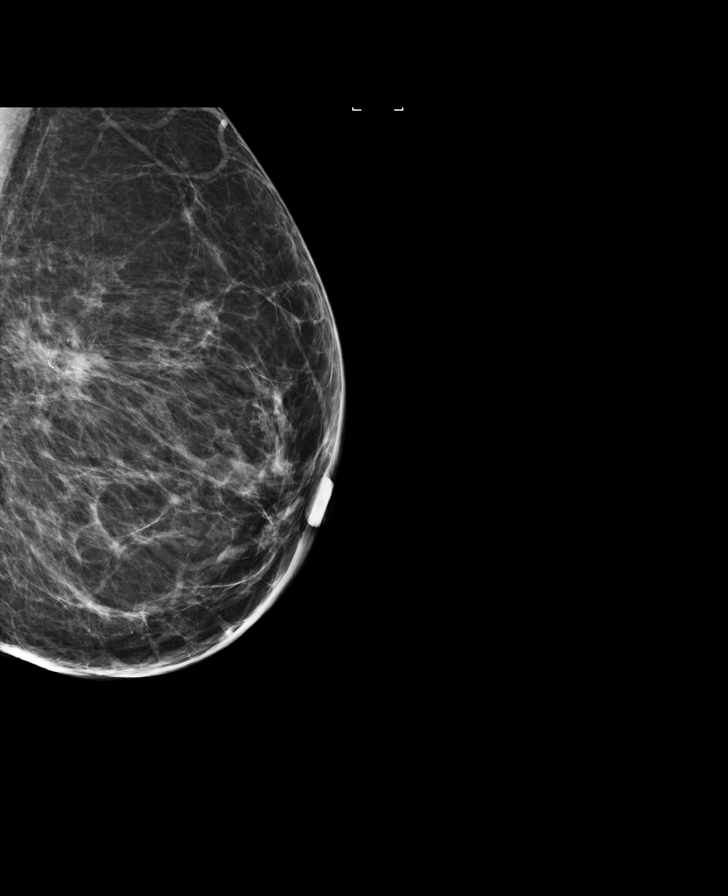

[L ML (2 of 2)]
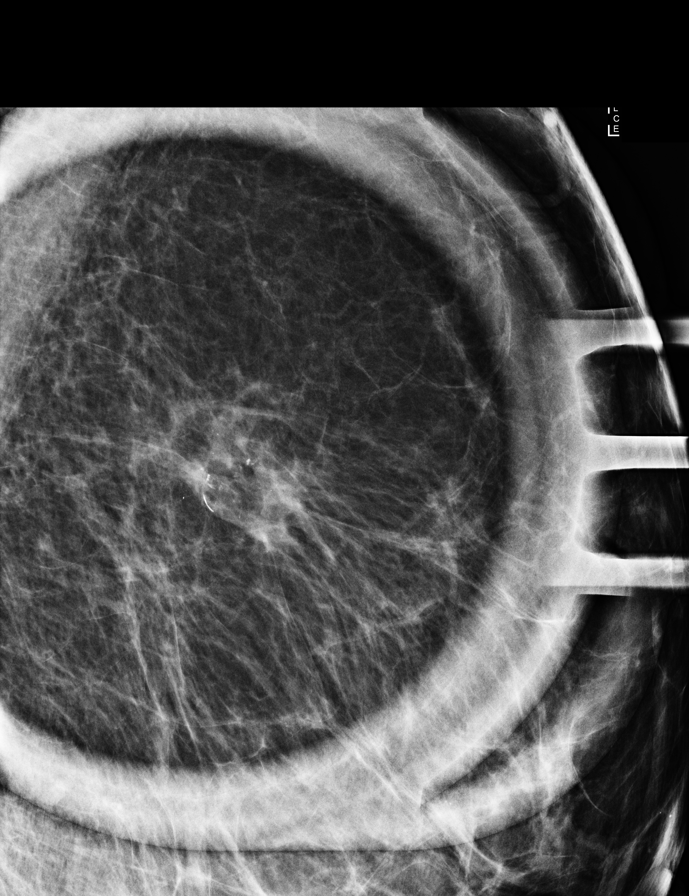

[L MLO synth-2D]
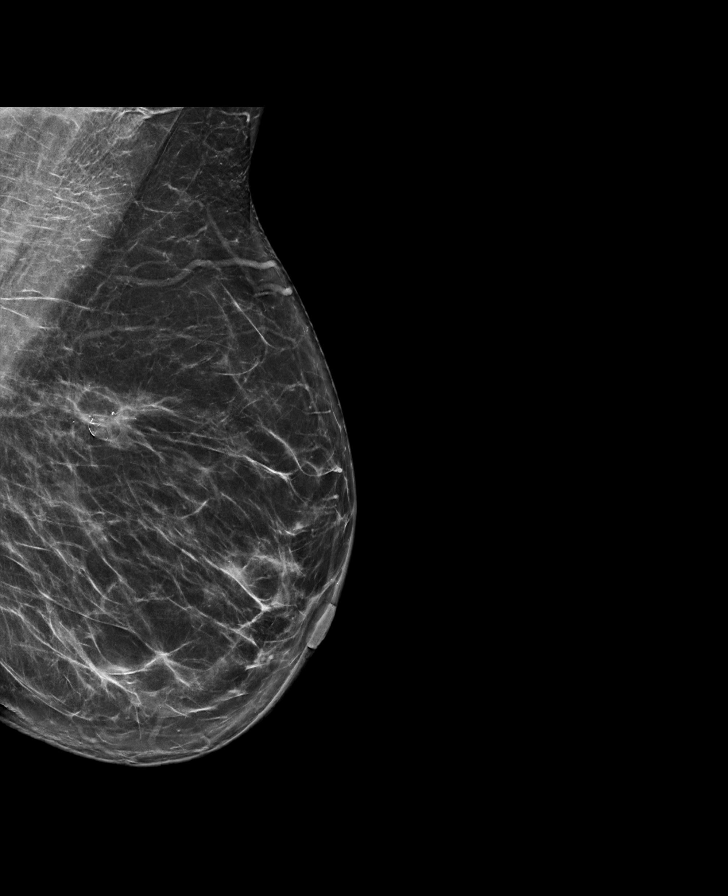

[L CC synth-2D]
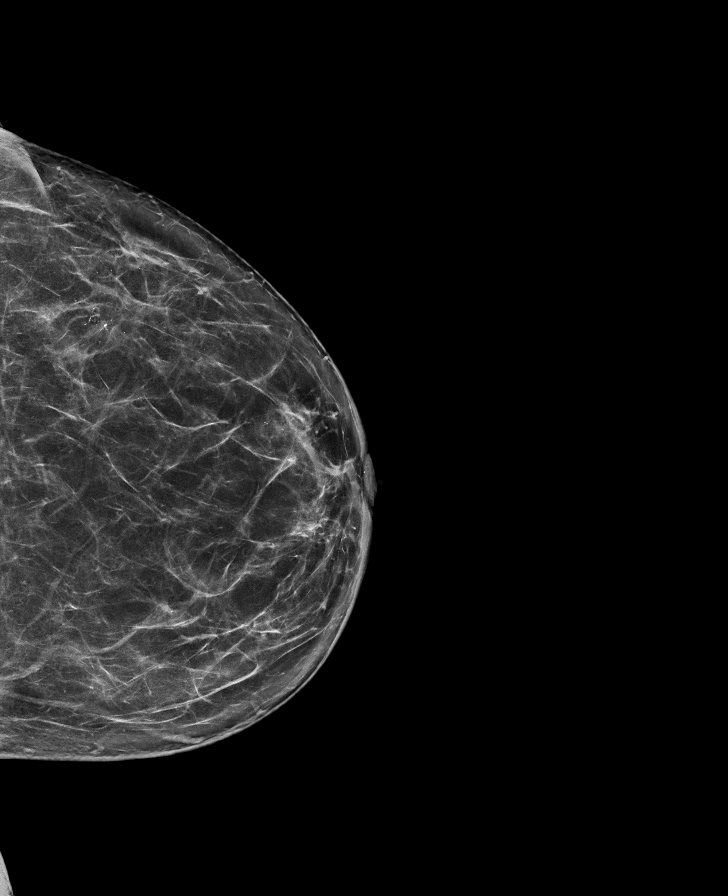

[R CC synth-2D]
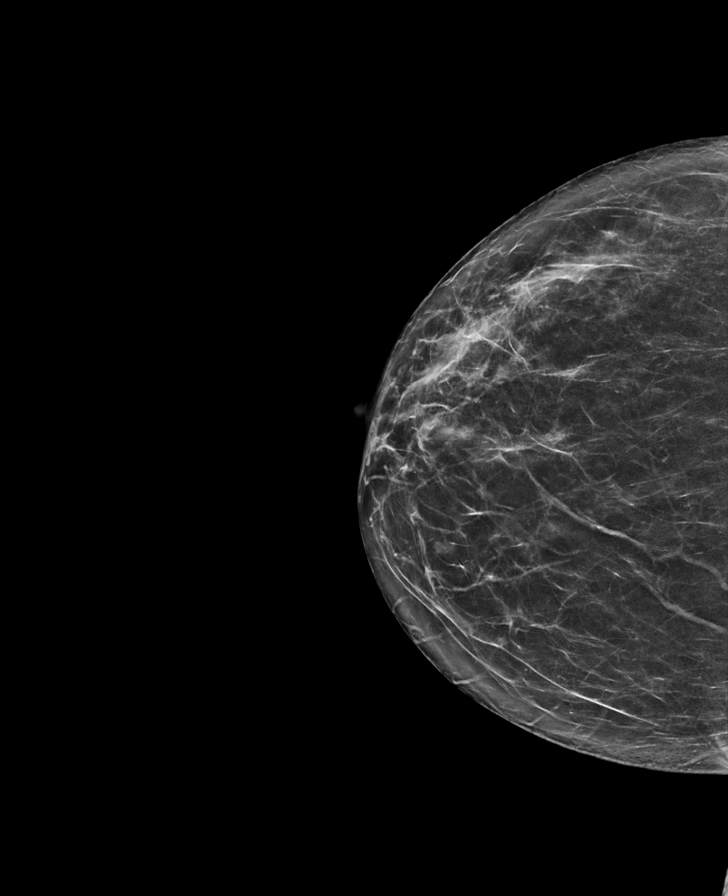

[R MLO synth-2D]
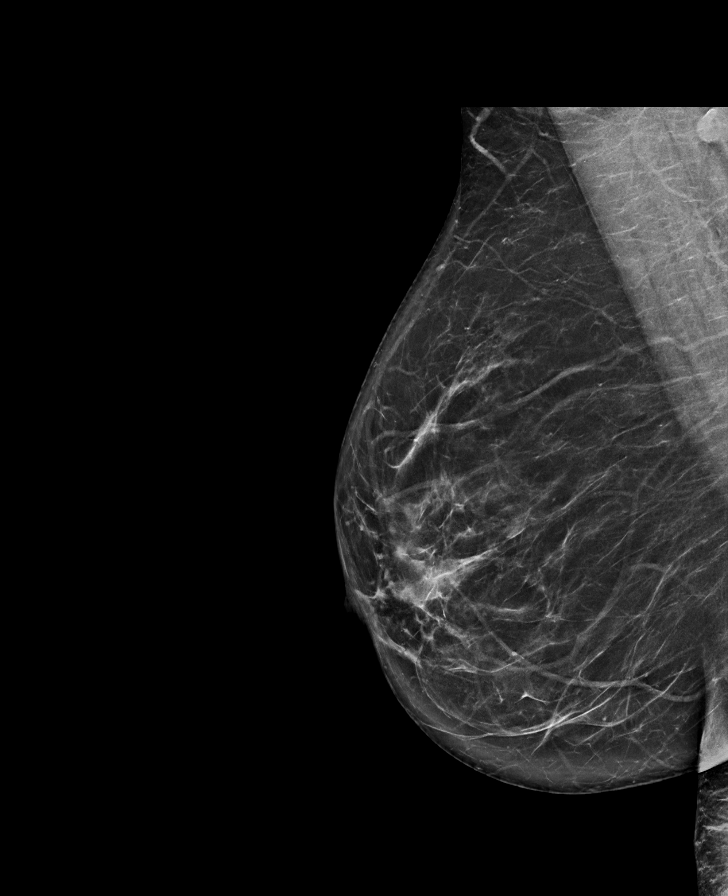

[8 of 28 positions shown; findings below may reference images not displayed]

ACR Breast Density Category b: There are scattered areas of
fibroglandular density.
FINDINGS: Lumpectomy changes are seen in the upper-outer quadrant of the left
breast. There are developing calcifications in the lumpectomy site
that are felt to likely be benign and dystrophic. There is no
suspicious mass in the left breast. No suspicious mass or malignant
type microcalcifications identified in the right breast.

Mammographic images were processed with CAD.
IMPRESSION: Probable benign dystrophic calcifications in the left breast.

RECOMMENDATION:
Short-term interval follow-up left mammogram in 6 months is
recommended.

I have discussed the findings and recommendations with the patient.
If applicable, a reminder letter will be sent to the patient
regarding the next appointment.

BI-RADS CATEGORY  3: Probably benign.

## 2019-12-05 ENCOUNTER — Ambulatory Visit (INDEPENDENT_AMBULATORY_CARE_PROVIDER_SITE_OTHER): Payer: 59 | Admitting: Bariatrics

## 2019-12-05 ENCOUNTER — Encounter (INDEPENDENT_AMBULATORY_CARE_PROVIDER_SITE_OTHER): Payer: Self-pay | Admitting: Bariatrics

## 2019-12-05 ENCOUNTER — Other Ambulatory Visit: Payer: Self-pay

## 2019-12-05 VITALS — BP 133/87 | HR 74 | Temp 98.1°F | Ht 66.0 in | Wt 193.0 lb

## 2019-12-05 DIAGNOSIS — R7303 Prediabetes: Secondary | ICD-10-CM

## 2019-12-05 DIAGNOSIS — E559 Vitamin D deficiency, unspecified: Secondary | ICD-10-CM

## 2019-12-05 DIAGNOSIS — Z6831 Body mass index (BMI) 31.0-31.9, adult: Secondary | ICD-10-CM

## 2019-12-05 DIAGNOSIS — F3289 Other specified depressive episodes: Secondary | ICD-10-CM | POA: Diagnosis not present

## 2019-12-05 DIAGNOSIS — Z9189 Other specified personal risk factors, not elsewhere classified: Secondary | ICD-10-CM

## 2019-12-05 DIAGNOSIS — E669 Obesity, unspecified: Secondary | ICD-10-CM | POA: Diagnosis not present

## 2019-12-05 MED ORDER — PHENTERMINE-TOPIRAMATE ER 7.5-46 MG PO CP24: ORAL_CAPSULE | ORAL | 0 refills | Status: DC

## 2019-12-05 MED ORDER — VITAMIN D (ERGOCALCIFEROL) 1.25 MG (50000 UNIT) PO CAPS: 50000.0000 [IU] | ORAL_CAPSULE | ORAL | 0 refills | Status: DC

## 2019-12-06 ENCOUNTER — Inpatient Hospital Stay: Payer: 59 | Attending: Oncology | Admitting: Oncology

## 2019-12-06 DIAGNOSIS — Z170 Estrogen receptor positive status [ER+]: Secondary | ICD-10-CM | POA: Diagnosis not present

## 2019-12-06 DIAGNOSIS — C50412 Malignant neoplasm of upper-outer quadrant of left female breast: Secondary | ICD-10-CM

## 2019-12-06 NOTE — Progress Notes (Signed)
[facility]  Telephone:([phone]) [phone] Fax:(336) [phone]     ID: Bethany [name] DOB: [birth year]  MR#: [id number]  WNI#:[id number]  Patient Care Team: Leeroy Cha, MD as PCP - General (Internal Medicine) Burnell Blanks, MD as PCP - Cardiology (Cardiology) Erroll Luna, MD as Consulting Physician (General Surgery) Terell Kincy, Virgie Dad, MD as Consulting Physician (Oncology) Kyung Rudd, MD as Consulting Physician (Radiation Oncology) Irene Shipper, MD as Consulting Physician (Gastroenterology) [name], MD as Consulting Physician (Obstetrics and Gynecology) Delrae Rend, MD as Consulting Physician (Endocrinology) Mauro Kaufmann, RN as Oncology Nurse Navigator Rockwell Germany, RN as Oncology Nurse Navigator Icard, Octavio Graves, DO as Consulting Physician (Pulmonary Disease) Mcarthur Rossetti, MD as Consulting Physician (Orthopedic Surgery) OTHER MD:  I connected with Bethany [name] on 12/08/19 at  3:00 PM EDT by telephone visit and verified that I am speaking with the correct person using two identifiers.   I discussed the limitations, risks, security and privacy concerns of performing an evaluation and management service by telemedicine and the availability of in-person appointments. I also discussed with the patient that there may be a patient responsible charge related to this service. The patient expressed understanding and agreed to proceed.   Other persons participating in the visit and their role in the encounter: none  Patient's location: home  Provider's location: [place]: Estrogen receptor positive breast cancer  CURRENT TREATMENT: Observation   INTERVAL HISTORY: Bethany [name] was contacted today for follow-up of her estrogen receptor positive breast cancer.   She was switched to exemestane at her last visit on 10/12/2019.  She has not been able to tolerate this.  She had to stopped after  about 6 to 8 weeks.  She describes this at worse than tamoxifen.  Since her last visit, she underwent bilateral diagnostic mammography with tomography at [facility] on 11/29/2019 showing: breast density category B; probably-benign dystrophic calcifications; no evidence of malignancy in right breast. Follow up left breast mammogram has been scheduled for 06/03/2020.  She is also scheduled for bone density screening on 01/25/2020.   REVIEW OF SYSTEMS: Bethany [name] is very anxious about the findings on her mammography and wants to proceed directly to biopsy.  She wonders if she could take a lower dose of tamoxifen.  Aside from these issues a detailed review of systems today was noncontributory    HISTORY OF CURRENT ILLNESS: The original intake note:  "Bethany [name]" had routine screening mammography on 10/26/2017 showing a possible abnormality in the left breast. She underwent unilateral left diagnostic mammography with tomography and left breast ultrasonography at [facility] on 10/14/2017 showing: breast density category B. There was a hypoechoic lesion consistent of a mass at the 2:30 o'clock upper outer quadrant middle depth and measuring 0.5 x 0.3 x 0.4 cm. Sonographic evaluation of the left axilla shows no enlarged or abnormal lymph nodes.  An attempt was made to obtain a biopsy of this lesion on 11/08/2017, however the patient had repeated episodes of syncope during the attempted procedure.  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 Margins were negative for carcinoma. Prognostic indicators significant for: estrogen receptor, 90% positive and progesterone receptor, 80% positive, both with strong staining intensity. Proliferation marker Ki67 at 3%. HER2 negative with an immunohistochemistry of (1+).  Then in a separate procedure she underwent left sentinel lymph node sampling on 12/09/2017 showing  ([id number]):  Four left axillary sentinel lymph were negative for carcinoma. (0/4).   Her subsequent history is as detailed below.   PAST MEDICAL HISTORY: Past Medical History:  Diagnosis Date  . ADD (attention deficit disorder)   . Anxiety   . B12 deficiency   . Breast cancer ([place]) 2019   left breast cancer/diagnosed in 10/2017/taking radiation treatment until 03/15/18  . Breast mass, left 11/2017   going thru radiation until 02/2018  . CAD (coronary artery disease)    a. DES to LAD 06/2019.  Marland Kitchen Chest pain 06/19/2014  . COPD (chronic obstructive pulmonary disease) ([place])    beginning stages/small scar  . Dental crowns present   . Depression   . Hiatal hernia   . History of hiatal hernia    no current med.  Marland Kitchen History of thyroid cancer 12/15/2017  . Hypertension    states under control with med., has been on med. x 1 yr.  . Hypothyroidism   . Malignant neoplasm of upper-outer quadrant of left breast in female, estrogen receptor positive ([place])   . Mild hyperlipidemia   . 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 MVP (mitral valve prolapse)   . Personal history of radiation therapy 2019   Left Breast Cancer  . PONV (postoperative nausea and vomiting)   . 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 Syncope and collapse 12/25/2008   Qualifier: Diagnosis of  By: Philemon Kingdom    . Urinary incontinence    USI   . Vitamin D deficiency   Thyroid cancer, GERD   PAST SURGICAL HISTORY: Past Surgical History:  Procedure Laterality Date  . ABDOMINAL HYSTERECTOMY     partial  . APPENDECTOMY    . AXILLARY SENTINEL NODE BIOPSY Left 12/09/2017   Procedure: AXILLARY SENTINEL NODE BIOPSY;  Surgeon: Erroll Luna, MD;  Location: [facility];  Service: General;  Laterality: Left;  . BREAST EXCISIONAL BIOPSY Right 09/20/2019  . BREAST LUMPECTOMY Left 11/2017  . BREAST LUMPECTOMY WITH NEEDLE LOCALIZATION Left 11/25/2017   Procedure: LEFT BREAST LUMPECTOMY WITH NEEDLE LOCALIZATION;   Surgeon: Erroll Luna, MD;  Location: [facility];  Service: General;  Laterality: Left;  . BREAST LUMPECTOMY WITH RADIOACTIVE SEED LOCALIZATION Right 09/20/2019   Procedure: RIGHT BREAST LUMPECTOMY WITH RADIOACTIVE SEED LOCALIZATION;  Surgeon: Erroll Luna, MD;  Location: [facility];  Service: General;  Laterality: Right;  . COLONOSCOPY WITH PROPOFOL  10/03/2015  . CORONARY STENT INTERVENTION N/A 06/29/2019   Procedure: CORONARY STENT INTERVENTION;  Surgeon: Burnell Blanks, MD;  Location: [facility];  Service: Cardiovascular;  Laterality: N/A;  . LAPAROSCOPIC APPENDECTOMY N/A 10/03/2015   Procedure: APPENDECTOMY LAPAROSCOPIC;  Surgeon: Rolm Bookbinder, MD;  Location: [facility];  Service: General;  Laterality: N/A;  . LEFT HEART CATH AND CORONARY ANGIOGRAPHY N/A 06/29/2019   Procedure: LEFT HEART CATH AND CORONARY ANGIOGRAPHY;  Surgeon: Burnell Blanks, MD;  Location: [facility];  Service: Cardiovascular;  Laterality: N/A;  . TOTAL THYROIDECTOMY  04/02/2003  Hysterectomy without BSO   FAMILY HISTORY Family History  Problem Relation Age of Onset  . Hypertension Father   . Heart disease Father   . Non-Hodgkin's lymphoma Father   . Cancer Father   . Depression Father   . Anxiety disorder Father   . Alcoholism Father   . Hypertension Brother   . Heart disease Paternal Grandfather   . Colon cancer Other        great maternal aunt   .  Colon polyps Mother   . High Cholesterol Mother   . Alcoholism Mother   . Stomach cancer Neg Hx   . Rectal cancer Neg Hx   . Esophageal cancer Neg Hx    The patient's father died in 03/22/2017 age age 74 due to non-Hodgkin's lymphoma. The patient's mother is alive at age 85 as of October 2019. The patient has 1 brother, no sisters. There was a paternal aunt with lung cancer. There was a paternal grandmother and maternal grandfather with throat cancer. She denies a family history of breast or  ovarian cancer.    GYNECOLOGIC HISTORY:  No LMP recorded. Patient has had a hysterectomy. Menarche: 59 years old Age at first live birth: 59 years old She is GX P3.  She is status post partial hysterectomy (without BSO) in her late 22's. She did not have HRT.    SOCIAL HISTORY: (updated 09/2018) Jordanne owns a jewelry business. Her husband, Patrick Jupiter, is a Clinical cytogeneticist for [employer]. At home is her, her husband, and their two dogs. The patient's oldest son, [place], has 3 children and lives in [place] and works in Nurse, children's for [employer]. The patient's son, Alpha Gula lives in [place] with 1 daughter and works as a Teacher, music. The patient's youngest son, Aline Brochure lives in [place] and works for [employer] in [place]. The patient has 5 grandchildren total. Her grandchildren are ages 19, 44, 74, 66, and 3. She notes the youngest has Down Syndrome and recently turned 3. She does not currently belong to a church, but she is [place].     ADVANCED DIRECTIVES: In the absence of any documentation to the contrary, the patient's spouse is their HCPOA.    HEALTH MAINTENANCE: Social History   Tobacco Use  . Smoking status: Former Smoker    Quit date: 08/14/2014    Years since quitting: 5.3  . Smokeless tobacco: Never Used  Vaping Use  . Vaping Use: Never used  Substance Use Topics  . Alcohol use: Yes    Comment: rarely  . Drug use: No    Colonoscopy: 03/15/2018- to be repeated in 3 years under Dr. Henrene Pastor  PAP:   Bone density: 10/25/2017 showed osteopenia   Allergies  Allergen Reactions  . Oxycodone Nausea And Vomiting  . Latex Rash  . Other Rash    "Sutures caused severe rash."  . Sulfa Antibiotics     hives    Current Outpatient Medications  Medication Sig Dispense Refill  . albuterol (VENTOLIN HFA) 108 (90 Base) MCG/ACT inhaler Inhale 2 puffs into the lungs every 6 (six) hours as needed for wheezing or shortness of breath. 18 g 6  . aspirin EC 81 MG tablet Take 81 mg by mouth daily.     . clopidogrel (PLAVIX) 75 MG tablet Take 1 tablet (75 mg total) by mouth daily. 90 tablet 3  . isosorbide mononitrate (IMDUR) 30 MG 24 hr tablet Take 0.5 tablets (15 mg total) by mouth daily. 45 tablet 3  . levothyroxine (SYNTHROID, LEVOTHROID) 137 MCG tablet Take 137 mcg by mouth at bedtime.     Marland Kitchen losartan-hydrochlorothiazide (HYZAAR) 50-12.5 MG tablet Take 1 tablet by mouth daily.    . Multiple Vitamin (MULTIVITAMIN WITH MINERALS) TABS tablet Take 1 tablet by mouth daily.    . nitroGLYCERIN (NITROSTAT) 0.4 MG SL tablet Place 1 tablet (0.4 mg total) under the tongue every 5 (five) minutes as needed for chest pain. 25 tablet 3  . Phentermine-Topiramate 7.5-46 MG CP24 1 capsule daily in  the am 30 capsule 0  . rosuvastatin (CRESTOR) 40 MG tablet Take 1 tablet (40 mg total) by mouth daily. 90 tablet 3  . venlafaxine XR (EFFEXOR-XR) 150 MG 24 hr capsule TAKE 1 CAPSULE (150 MG) BY MOUTH ONCE A DAY WITH BREAKFAST 90 capsule 5  . Vitamin D, Ergocalciferol, (DRISDOL) 1.25 MG (50000 UNIT) CAPS capsule Take 1 capsule (50,000 Units total) by mouth every 7 (seven) days. 4 capsule 0   No current facility-administered medications for this visit.    OBJECTIVE:   There were no vitals filed for this visit.   There is no height or weight on file to calculate BMI.   Wt Readings from Last 3 Encounters:  12/05/19 193 lb (87.5 kg)  11/16/19 194 lb (88 kg)  10/31/19 (P) 191 lb (86.6 kg)      ECOG FS:1 - Symptomatic but completely ambulatory  Telemedicine visit 12/06/2019  LAB RESULTS:  CMP     Component Value Date/Time   NA 140 10/12/2019 0812   NA 141 09/19/2019 1155   K 4.2 10/12/2019 0812   CL 105 10/12/2019 0812   CO2 27 10/12/2019 0812   GLUCOSE 97 10/12/2019 0812   BUN 17 10/12/2019 0812   BUN 11 09/19/2019 1155   CREATININE 0.86 10/12/2019 0812   CREATININE 0.78 12/16/2017 1540   CALCIUM 9.7 10/12/2019 0812   PROT 6.7 10/12/2019 0812   PROT 6.5 09/19/2019 1155   ALBUMIN 3.8 10/12/2019  0812   ALBUMIN 4.3 09/19/2019 1155   AST 20 10/12/2019 0812   AST 12 (L) 12/16/2017 1540   ALT 27 10/12/2019 0812   ALT 16 12/16/2017 1540   ALKPHOS 78 10/12/2019 0812   BILITOT 0.4 10/12/2019 0812   BILITOT 0.3 09/19/2019 1155   BILITOT 0.4 12/16/2017 1540   GFRNONAA >60 10/12/2019 0812   GFRNONAA >60 12/16/2017 1540   GFRAA >60 10/12/2019 0812   GFRAA >60 12/16/2017 1540    No results found for: TOTALPROTELP, ALBUMINELP, A1GS, A2GS, BETS, BETA2SER, GAMS, MSPIKE, SPEI  No results found for: KPAFRELGTCHN, LAMBDASER, KAPLAMBRATIO  Lab Results  Component Value Date   WBC 5.5 10/12/2019   NEUTROABS 2.9 10/12/2019   HGB 12.2 10/12/2019   HCT 38.0 10/12/2019   MCV 93.4 10/12/2019   PLT 262 10/12/2019   No results found for: LABCA2  No components found for: UTMLYY503  No results for input(s): INR in the last 168 hours.  No results found for: LABCA2  No results found for: TWS568  No results found for: LEX517  No results found for: GYF749  No results found for: CA2729  No components found for: HGQUANT  No results found for: CEA1 / No results found for: CEA1   No results found for: AFPTUMOR  No results found for: CHROMOGRNA  No results found for: HGBA, HGBA2QUANT, HGBFQUANT, HGBSQUAN (Hemoglobinopathy evaluation)   No results found for: LDH  No results found for: IRON, TIBC, IRONPCTSAT (Iron and TIBC)  No results found for: FERRITIN  Urinalysis    Component Value Date/Time   COLORURINE YELLOW 10/02/2015 2022   APPEARANCEUR CLOUDY (A) 10/02/2015 2022   LABSPEC 1.020 10/02/2015 2022   PHURINE 6.5 10/02/2015 2022   GLUCOSEU NEGATIVE 10/02/2015 2022   HGBUR NEGATIVE 10/02/2015 2022   BILIRUBINUR NEGATIVE 10/02/2015 2022   KETONESUR 15 (A) 10/02/2015 2022   PROTEINUR NEGATIVE 10/02/2015 2022   UROBILINOGEN 0.2 09/20/2014 1536   NITRITE NEGATIVE 10/02/2015 2022   LEUKOCYTESUR SMALL (A) 10/02/2015 2022     STUDIES:  MM DIAG BREAST TOMO  BILATERAL  Result Date: 11/29/2019 CLINICAL DATA:  History of left breast cancer status post lumpectomy in 2019. EXAM: DIGITAL DIAGNOSTIC BILATERAL MAMMOGRAM WITH TOMO AND CAD COMPARISON:  Previous exam(s). ACR Breast Density Category b: There are scattered areas of fibroglandular density. FINDINGS: Lumpectomy changes are seen in the upper-outer quadrant of the left breast. There are developing calcifications in the lumpectomy site that are felt to likely be benign and dystrophic. There is no suspicious mass in the left breast. No suspicious mass or malignant type microcalcifications identified in the right breast. Mammographic images were processed with CAD. IMPRESSION: Probable benign dystrophic calcifications in the left breast. RECOMMENDATION: Short-term interval follow-up left mammogram in 6 months is recommended. I have discussed the findings and recommendations with the patient. If applicable, a reminder letter will be sent to the patient regarding the next appointment. BI-RADS CATEGORY  3: Probably benign. Electronically Signed   By: Lillia Mountain M.D.   On: 11/29/2019 16:19     ELIGIBLE FOR AVAILABLE RESEARCH PROTOCOL: No   ASSESSMENT: 59 y.o. Bethany [name], [place] woman status post left breast upper outer quadrant lumpectomy 11/25/2017 for a pT1b pNX, stage Ia invasive ductal carcinoma, grade 1, estrogen and progesterone receptor positive, HER-2/neu negative, with an MIB-1 of 3%  (1) status post left axillary lymph node sampling 12/09/2017, all 4 sentinel lymph nodes clear  (2) Oncotype score of 24 predicts a 10 % risk of recurrence outside the breast over the next 9 years if the patient's only systemic therapy is an estrogens for 5 years.  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  She cannot sleep and she is having difficulty eating and concentrating because she is obsessing about the possibility of cancer.  She would much prefer to proceed to biopsy and I will go ahead and put that order in for her.  Aside from that we are beginning intensified screening.  She just had her mammography earlier this month.  I am setting her up for breast MRI in 6 months and we will continue to alternate those 2 tests every 6 months indefinitely.  She has not been able to tolerate anastrozole tamoxifen or exemestane.  Luckily her risk of recurrence is low.  I do not think we are going to be able to make much headway at this point.  I am going to see her again in March and at that time we will consider whether going on very low-dose of tamoxifen may be acceptable.  She knows to call for any other issue that may develop before the next visit  Ayme Short, Virgie Dad, MD  12/08/19 10:33 AM Medical Oncology and Hematology [facility] [place], [place] [address]  Tel. [phone]    Fax. [phone]   I, Wilburn Mylar, am acting as scribe for Dr. Virgie Dad. Pharaoh Pio.  I, Lurline Del MD, have reviewed the above documentation for accuracy and completeness, and I agree with the above.   *Total Encounter Time as defined by the Centers for Medicare and Medicaid Services  includes, in addition to the face-to-face time of a patient visit (documented in the note above) non-face-to-face time: obtaining and reviewing outside history, ordering and reviewing medications, tests or procedures, care coordination (communications with other health care professionals or caregivers) and documentation in the medical record.

## 2019-12-07 ENCOUNTER — Telehealth: Payer: Self-pay | Admitting: Oncology

## 2019-12-07 ENCOUNTER — Encounter (INDEPENDENT_AMBULATORY_CARE_PROVIDER_SITE_OTHER): Payer: Self-pay | Admitting: Bariatrics

## 2019-12-07 ENCOUNTER — Telehealth: Payer: Self-pay | Admitting: Licensed Clinical Social Worker

## 2019-12-07 ENCOUNTER — Other Ambulatory Visit: Payer: Self-pay | Admitting: Oncology

## 2019-12-07 DIAGNOSIS — C50412 Malignant neoplasm of upper-outer quadrant of left female breast: Secondary | ICD-10-CM

## 2019-12-07 NOTE — Telephone Encounter (Signed)
[place] [facility]  Clinical Social Work was referred by Dr. Jana Hakim for assessment of psychosocial needs.  Clinical Social Worker attempted to contact patient by phone  to offer support and assess for needs.    Called both numbers in chart. No answer and VM full on both numbers so unable to leave message.     Bethany [name], [place], [place] Worker [facility]

## 2019-12-07 NOTE — Progress Notes (Signed)
Chief Complaint:   OBESITY Bethany [name] is here to discuss her progress with her obesity treatment plan along with follow-up of her obesity related diagnoses. Bethany [name] is on the Category 2 Plan and states she is following her eating plan approximately 70% of the time. Bethany [name] states she is walking 40 minutes 1-2 times per week.  Today's visit was #: 4 Starting weight: 192 lbs Starting date: 10/16/2019 Today's weight: 193 lbs Today's date: 12/05/2019 Total lbs lost to date: 0 Total lbs lost since last in-office visit: 1  Interim History: Bethany [name] is down 1 lb. She reports not getting enough water.  Subjective:   Vitamin D deficiency. No nausea, vomiting, or muscle weakness.    Ref. Range 10/16/2019 12:46  Vitamin D, 25-Hydroxy Latest Ref Range: 30.0 - 100.0 ng/mL 28.1 (L)   Prediabetes. Bethany [name] has a diagnosis of prediabetes based on her elevated HgA1c and was informed this puts her at greater risk of developing diabetes. She continues to work on diet and exercise to decrease her risk of diabetes. She denies nausea or hypoglycemia. Bethany [name] is on no medication.  Lab Results  Component Value Date   HGBA1C 5.7 (H) 10/16/2019   Lab Results  Component Value Date   INSULIN 11.1 10/16/2019   Other depression, with emotional eating. Bethany [name] is struggling with emotional eating and using food for comfort to the extent that it is negatively impacting her health. She has been working on behavior modification techniques to help reduce her emotional eating and has been somewhat successful. She shows no sign of suicidal or homicidal ideations. Bethany [name] is taking Trokendi, which she states is not working.  At risk for osteoporosis. Bethany [name] is at higher risk of osteopenia and osteoporosis due to Vitamin D deficiency.   Assessment/Plan:   Vitamin D deficiency. Low Vitamin D level contributes to fatigue and are associated with obesity, breast, and colon cancer. She was given a prescription for Vitamin  D, Ergocalciferol, (DRISDOL) 1.25 MG (50000 UNIT) CAPS capsule every week #4 with 0 refills and will follow-up for routine testing of Vitamin D, at least 2-3 times per year to avoid over-replacement.   Prediabetes. Bethany [name] will continue to work on weight loss, exercise, increasing healthy fats and protein, and decreasing simple carbohydrates to help decrease the risk of diabetes.   Other depression, with emotional eating. Behavior modification techniques were discussed today to help [name] deal with her emotional/non-hunger eating behaviors.  Orders and follow up as documented in patient record. Bethany [name] will stop the Trokendi and will start Qsymia. Prescription was given for Phentermine-Topiramate 7.5-46 MG CP24 1 daily #30 with 0 refills.  At risk for osteoporosis. Bethany [name] was given approximately 15 minutes of osteoporosis prevention counseling today. Bethany [name] is at risk for osteopenia and osteoporosis due to her Vitamin D deficiency. She was encouraged to take her Vitamin D and follow her higher calcium diet and increase strengthening exercise to help strengthen her bones and decrease her risk of osteopenia and osteoporosis.  Repetitive spaced learning was employed today to elicit superior memory formation and behavioral change.  Class 1 obesity with serious comorbidity and body mass index (BMI) of 31.0 to 31.9 in adult, unspecified obesity type.   Bethany [name] is currently in the action stage of change. As such, her goal is to continue with weight loss efforts. She has agreed to the Category 2 Plan.   She will work on meal planning, continuing to adhere to the meal plan, and increasing her water intake.  Exercise  goals: All adults should avoid inactivity. Some physical activity is better than none, and adults who participate in any amount of physical activity gain some health benefits.  Behavioral modification strategies: increasing lean protein intake, decreasing simple carbohydrates, increasing  vegetables, increasing water intake, decreasing eating out, no skipping meals, meal planning and cooking strategies, keeping healthy foods in the home and planning for success.  Bethany [name] has agreed to follow-up with our clinic in 3-4 weeks. She was informed of the importance of frequent follow-up visits to maximize her success with intensive lifestyle modifications for her multiple health conditions.   Objective:   Blood pressure 133/87, pulse 74, temperature 98.1 F (36.7 C), temperature source Oral, height 5\' 6"  (1.676 m), weight 193 lb (87.5 kg), SpO2 95 %. Body mass index is 31.15 kg/m.  General: Cooperative, alert, well developed, in no acute distress. HEENT: Conjunctivae and lids unremarkable. Cardiovascular: Regular rhythm.  Lungs: Normal work of breathing. Neurologic: No focal deficits.   Lab Results  Component Value Date   CREATININE 0.86 10/12/2019   BUN 17 10/12/2019   NA 140 10/12/2019   K 4.2 10/12/2019   CL 105 10/12/2019   CO2 27 10/12/2019   Lab Results  Component Value Date   ALT 27 10/12/2019   AST 20 10/12/2019   ALKPHOS 78 10/12/2019   BILITOT 0.4 10/12/2019   Lab Results  Component Value Date   HGBA1C 5.7 (H) 10/16/2019   HGBA1C 5.7 (H) 06/19/2014   Lab Results  Component Value Date   INSULIN 11.1 10/16/2019   Lab Results  Component Value Date   TSH 1.020 10/16/2019   Lab Results  Component Value Date   CHOL 191 10/16/2019   HDL 73 10/16/2019   LDLCALC 99 10/16/2019   TRIG 107 10/16/2019   CHOLHDL 2.3 09/19/2019   Lab Results  Component Value Date   WBC 5.5 10/12/2019   HGB 12.2 10/12/2019   HCT 38.0 10/12/2019   MCV 93.4 10/12/2019   PLT 262 10/12/2019   No results found for: IRON, TIBC, FERRITIN  Attestation Statements:   Reviewed by clinician on day of visit: allergies, medications, problem list, medical history, surgical history, family history, social history, and previous encounter notes.  Migdalia Dk, am acting as  Location manager for [employer], DO   I have reviewed the above documentation for accuracy and completeness, and I agree with the above. Jearld Lesch, DO

## 2019-12-07 NOTE — Telephone Encounter (Signed)
2nd attempt to contact patient. No answer, VM full.  CSW may be reconsulted if needed.   Edwinna Areola Jamaris Biernat, LCSW

## 2019-12-07 NOTE — Telephone Encounter (Signed)
Scheduled per 9/22 los. Pt will receive an updated appt calendar, per next visit appt notes

## 2019-12-15 ENCOUNTER — Other Ambulatory Visit: Payer: Self-pay

## 2019-12-15 ENCOUNTER — Ambulatory Visit
Admission: RE | Admit: 2019-12-15 | Discharge: 2019-12-15 | Disposition: A | Discharge status: Home / Self Care | Payer: 59 | Source: Ambulatory Visit | Attending: Oncology | Admitting: Oncology

## 2019-12-15 DIAGNOSIS — C50412 Malignant neoplasm of upper-outer quadrant of left female breast: Secondary | ICD-10-CM

## 2019-12-15 IMAGING — MG MM BREAST BX W LOC DEV 1ST LESION IMAGE BX SPEC STEREO GUIDE*L*
8 of 10 series · 8 of 22 positions shown · non-contrast
Comparison: Previous exams.
COMPARISON: Previous exams.

Addendum:
CLINICAL DATA: Patient presents for stereotactic guided core biopsy
of LEFT breast calcifications. History of LEFT lumpectomy in [DATE].

EXAM:
LEFT BREAST STEREOTACTIC CORE NEEDLE BIOPSY

[L (1 of 5)]
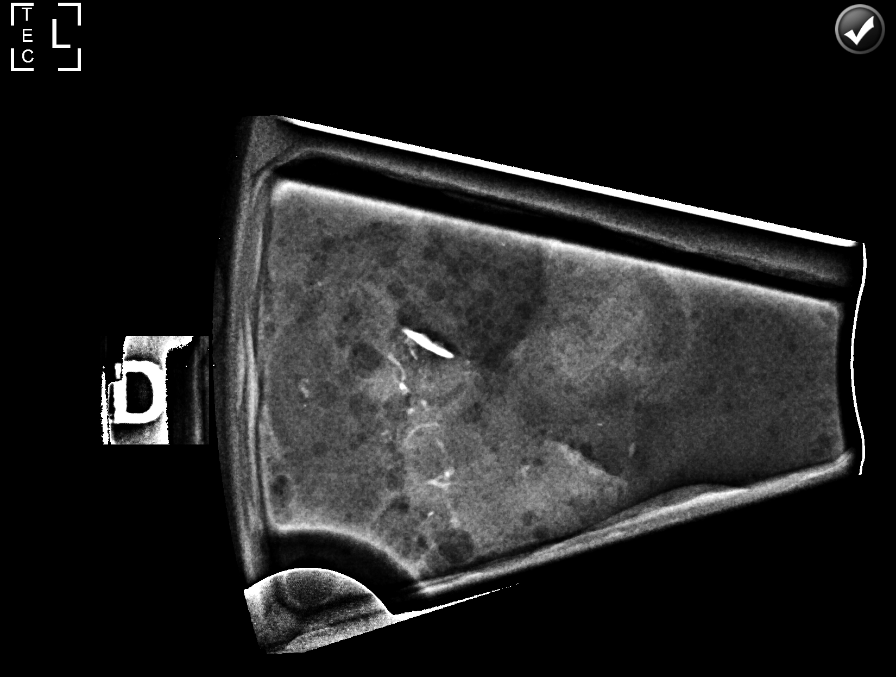

[L (2 of 5)]
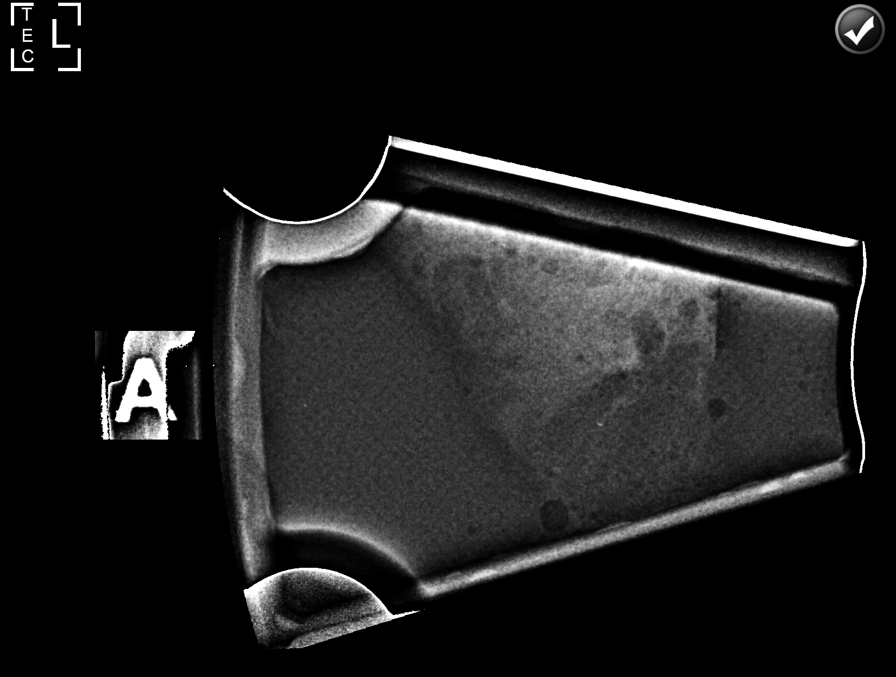

[L (3 of 5)]
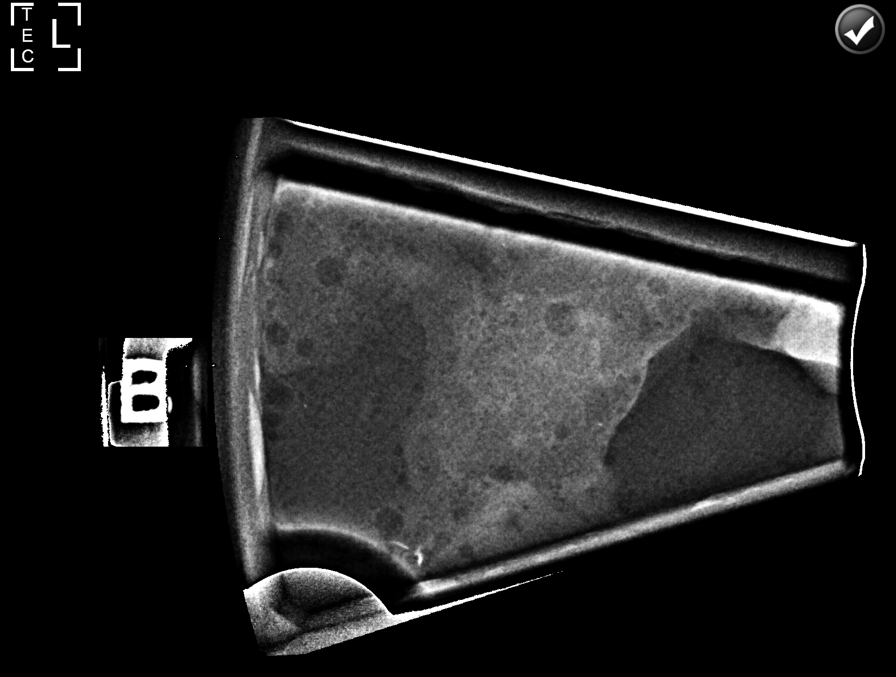

[L (4 of 5)]
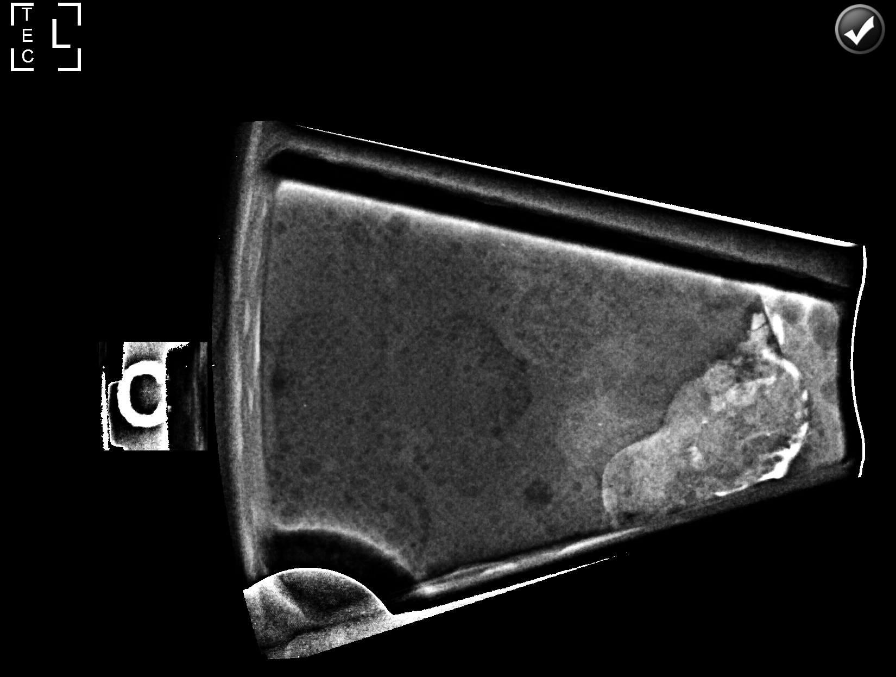

[L (5 of 5)]
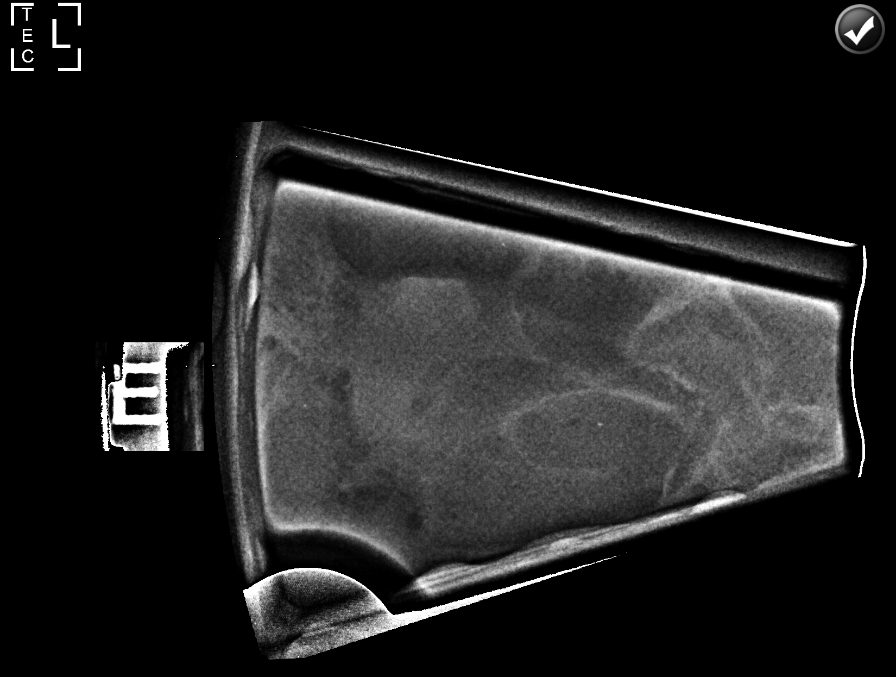

[L LM (1 of 2)]
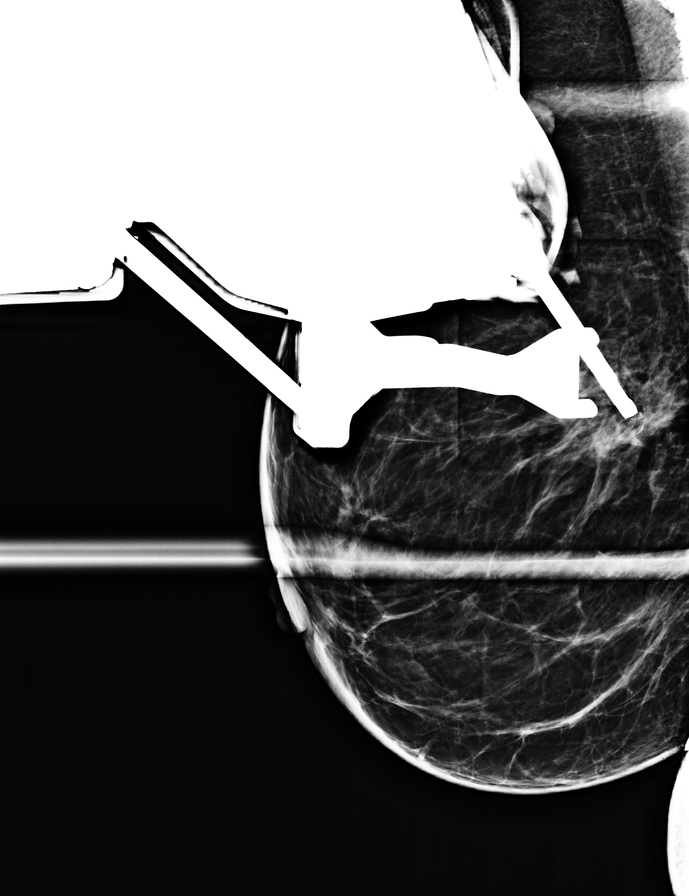

[L LM (2 of 2)]
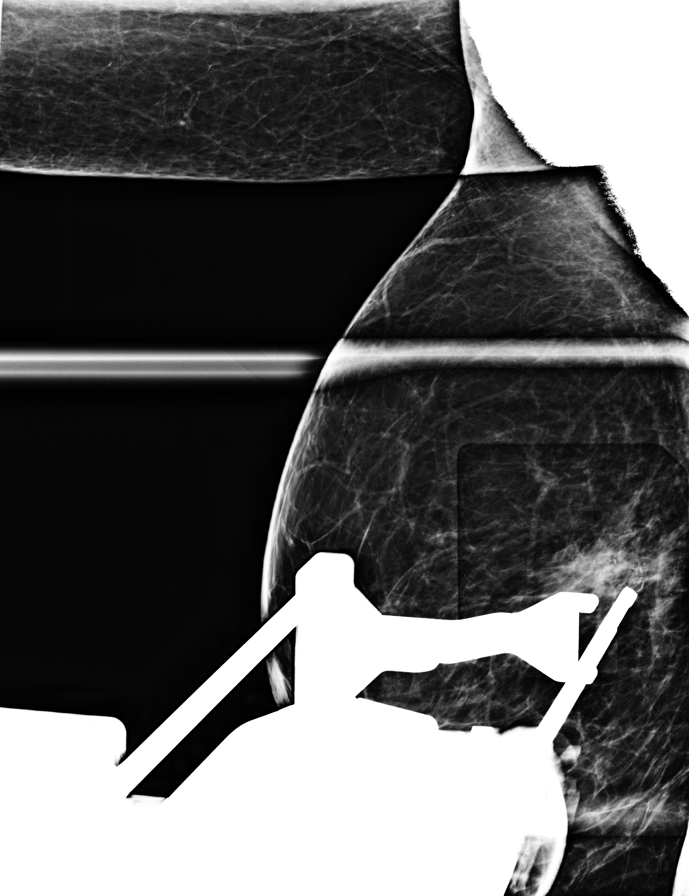

[L LM tomo · tomo slice 33/66.0]
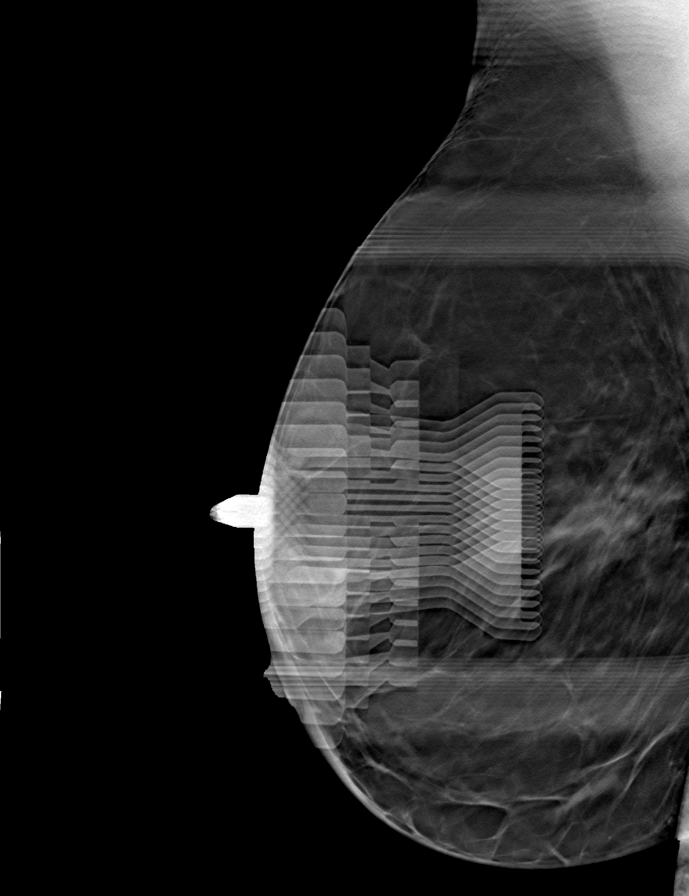

[8 of 22 positions shown; findings below may reference images not displayed]



Using sterile technique and 1% lidocaine and 1% lidocaine with
epinephrine as local anesthetic, under stereotactic guidance, a 9
gauge vacuum assisted device was used to perform core needle biopsy
of calcifications in the UPPER-OUTER QUADRANT of the LEFT breast
using a LATERAL to MEDIAL approach. Specimen radiograph was
performed showing calcifications in numerous tissue samples.
Specimens with calcifications are identified for pathology.

Lesion quadrant: UPPER-OUTER QUADRANT LEFT breast

At the conclusion of the procedure, coil shaped tissue marker clip
was deployed into the biopsy cavity. Follow-up 2-view mammogram was
performed and dictated separately.
IMPRESSION: Stereotactic-guided biopsy of LEFT breast calcifications. No
apparent complications.

ADDENDUM:
Pathology revealed PRIOR PROCEDURE SITE CHANGES WITH CALCIFICATIONS
of the LEFT breast, upper outer quadrant. This was found to be
concordant by Dr. RUDI.

Pathology results were discussed with the patient by telephone. The
patient reported doing well after the biopsy with tenderness at the
site. Post biopsy instructions and care were reviewed and questions
were answered. The patient was encouraged to call The [REDACTED]

The patient was instructed to return for annual diagnostic
mammography due [DATE] and informed a reminder notice would be sent
regarding this appointment.

Pathology results reported by RUDI RN on [DATE].



Using sterile technique and 1% lidocaine and 1% lidocaine with
epinephrine as local anesthetic, under stereotactic guidance, a 9
gauge vacuum assisted device was used to perform core needle biopsy
of calcifications in the UPPER-OUTER QUADRANT of the LEFT breast
using a LATERAL to MEDIAL approach. Specimen radiograph was
performed showing calcifications in numerous tissue samples.
Specimens with calcifications are identified for pathology.

Lesion quadrant: UPPER-OUTER QUADRANT LEFT breast

At the conclusion of the procedure, coil shaped tissue marker clip
was deployed into the biopsy cavity. Follow-up 2-view mammogram was
performed and dictated separately.
IMPRESSION: Stereotactic-guided biopsy of LEFT breast calcifications. No
apparent complications.

## 2019-12-15 IMAGING — MG MM BREAST LOCALIZATION CLIP
4 series · 4 of 12 positions shown · non-contrast
Comparison: Previous exam(s).

CLINICAL DATA: Status post stereotactic guided core biopsy of LEFT
breast calcifications.

EXAM:
DIAGNOSTIC LEFT MAMMOGRAM POST STEREOTACTIC BIOPSY

[L LM synth-2D]
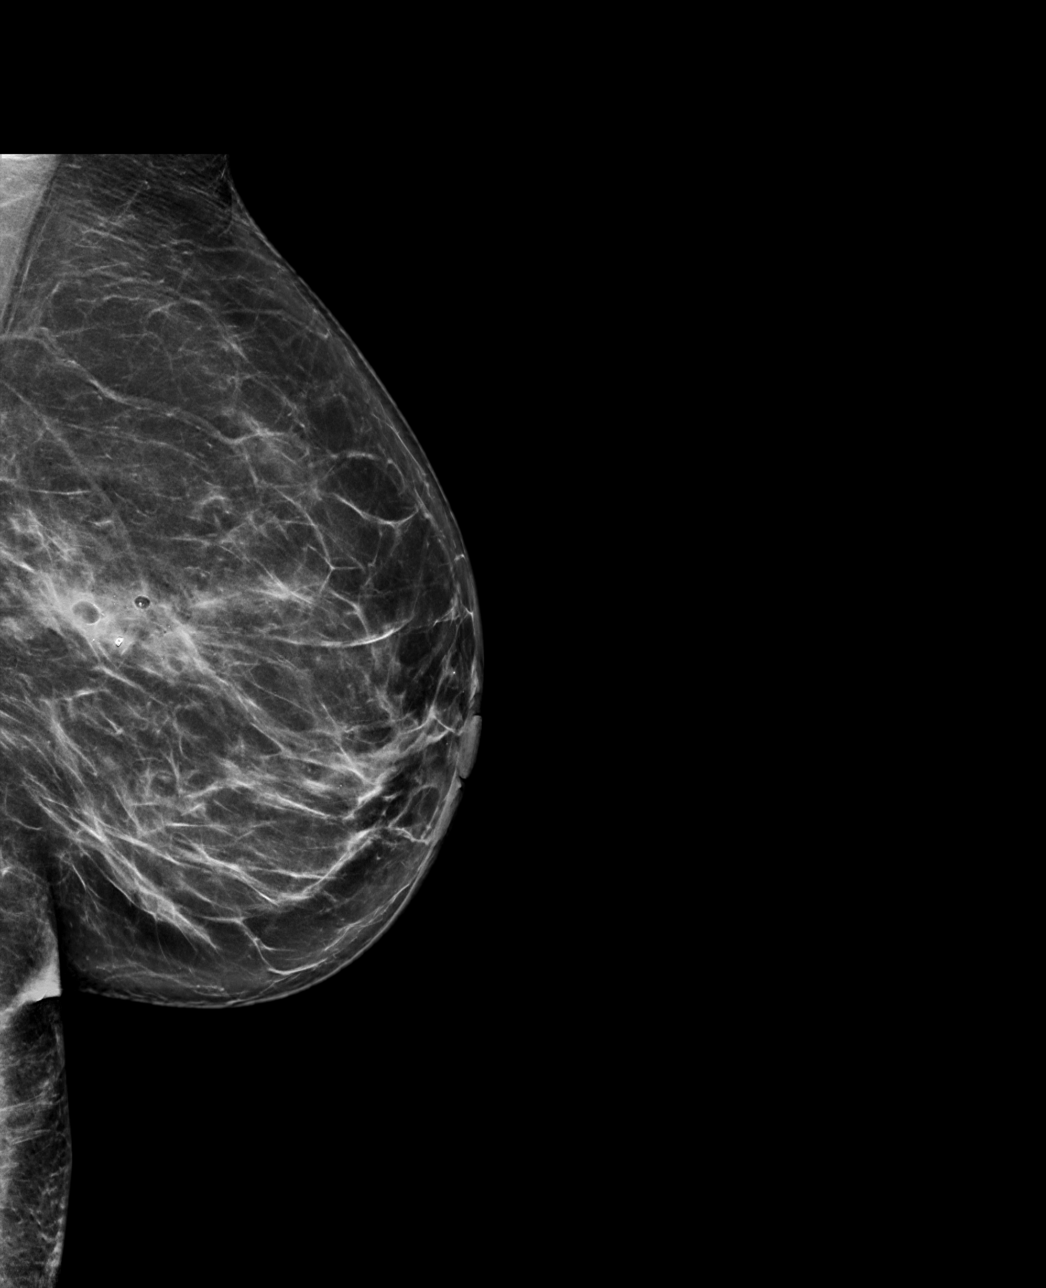

[L CC synth-2D]
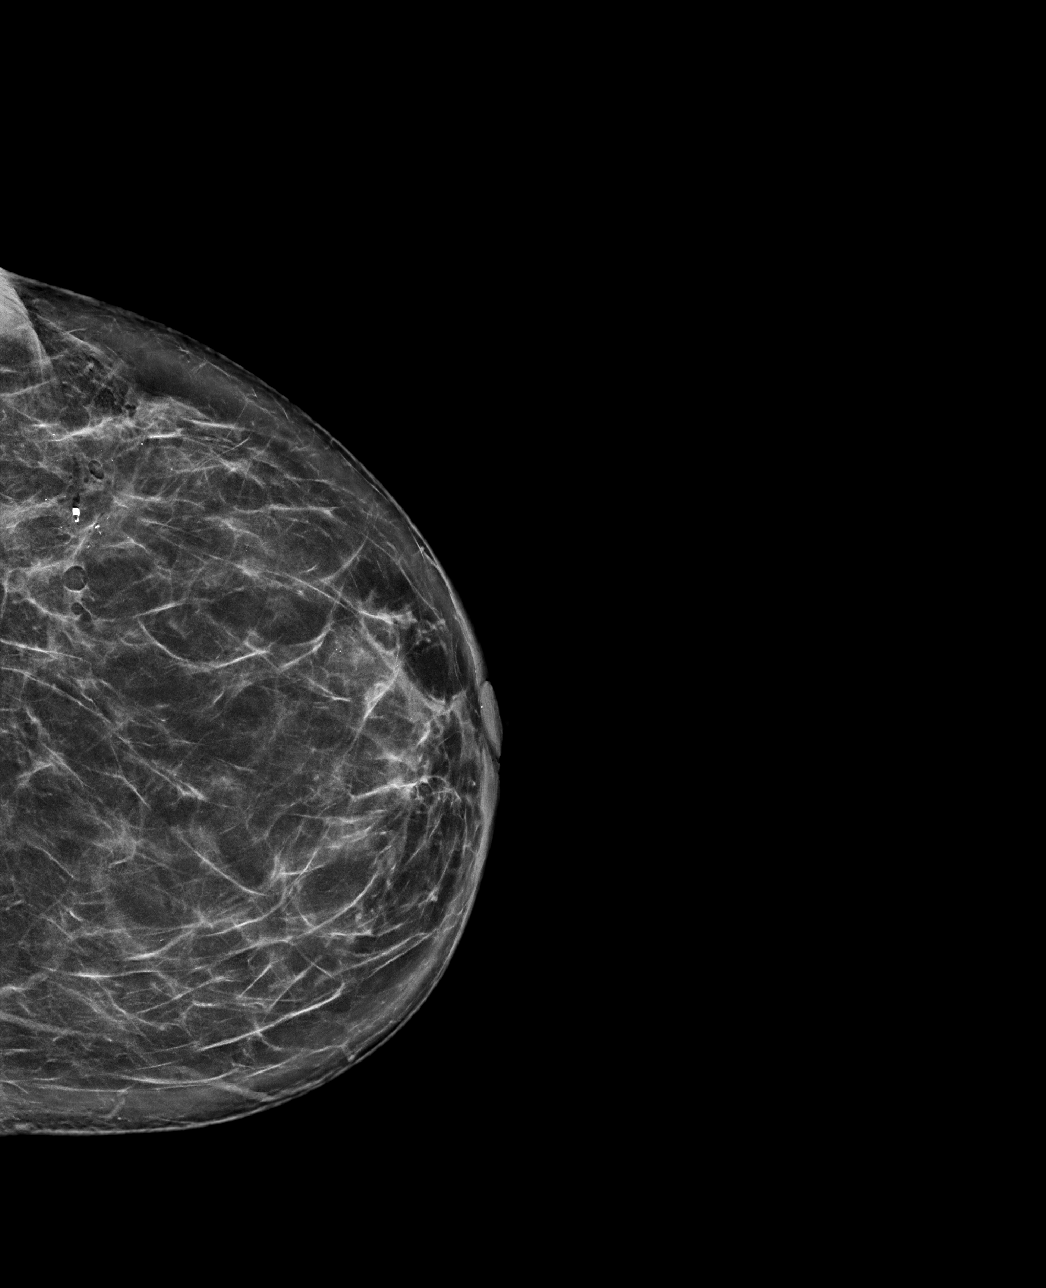

[L CC tomo · tomo slice 39/78.0]
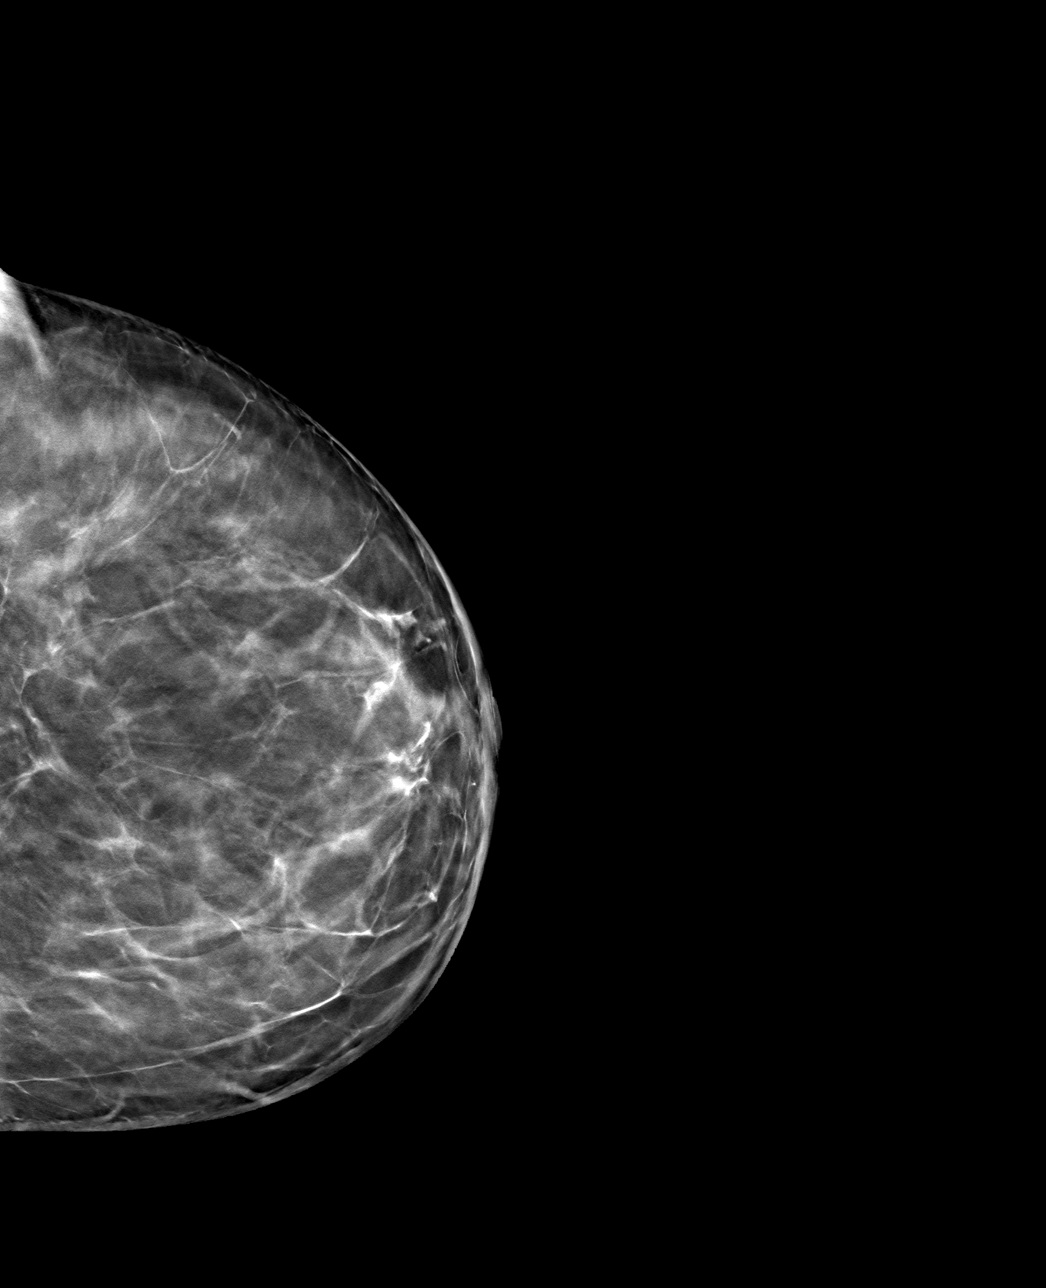

[L LM tomo · tomo slice 42/83.0]
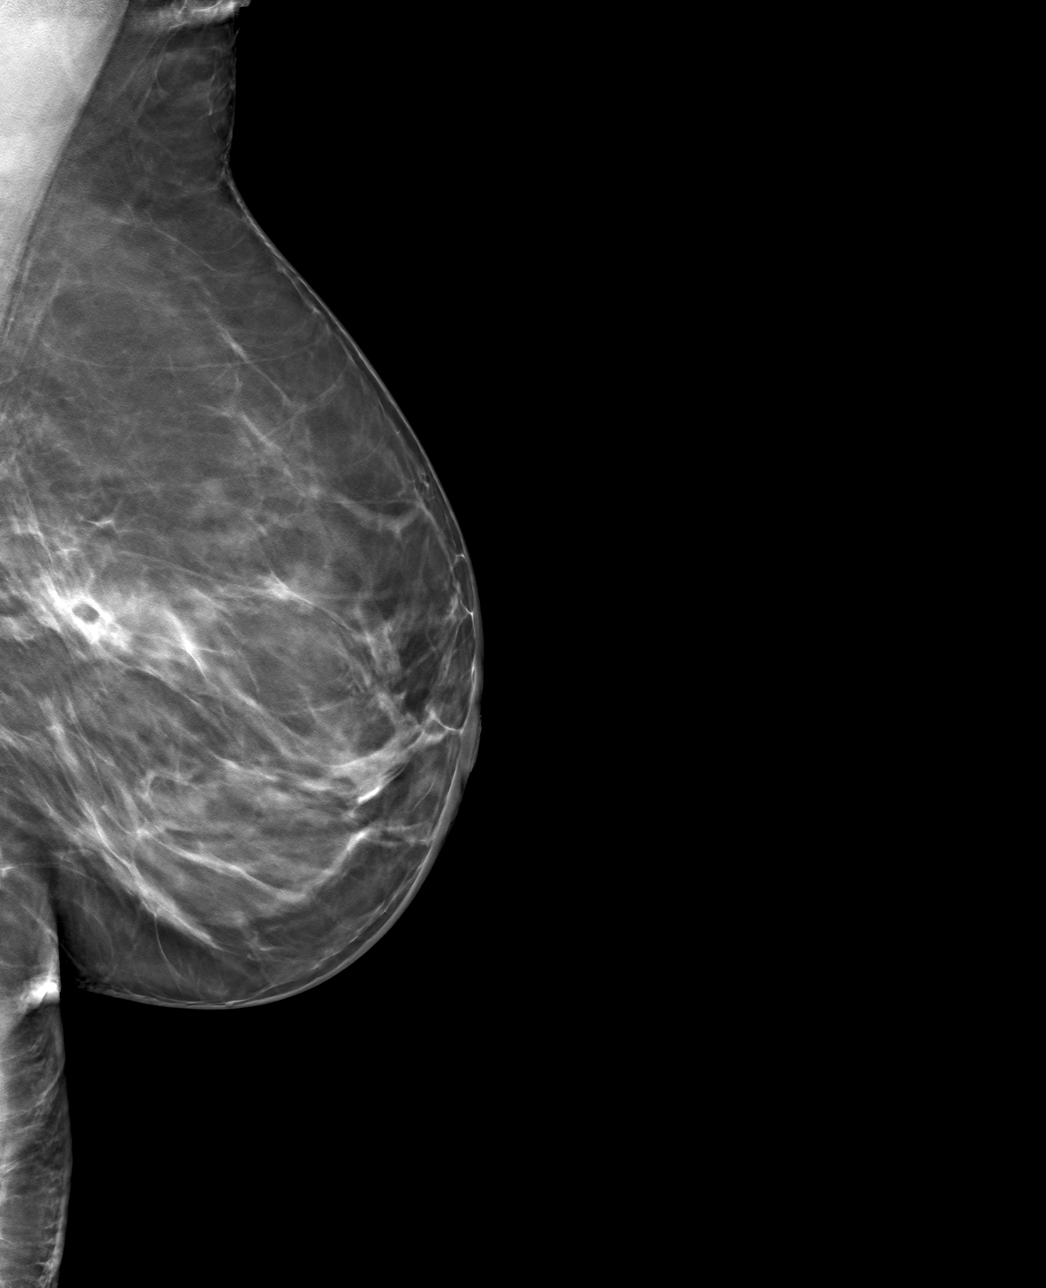

[4 of 12 positions shown; findings below may reference images not displayed]

FINDINGS: Mammographic images were obtained following stereotactic guided
biopsy of calcifications in the UPPER-OUTER QUADRANT of the LEFT
breast and placement of a coil shaped clip. The biopsy marking clip
is in expected position at the site of biopsy. Clip is adjacent to
residual calcifications.
IMPRESSION: Appropriate positioning of the coil shaped biopsy marking clip at
the site of biopsy in the UPPER-OUTER QUADRANT LEFT breast.

Final Assessment: Post Procedure Mammograms for Marker Placement

## 2019-12-18 ENCOUNTER — Other Ambulatory Visit: Payer: Self-pay | Admitting: *Deleted

## 2019-12-18 ENCOUNTER — Encounter: Payer: Self-pay | Admitting: Oncology

## 2019-12-22 ENCOUNTER — Other Ambulatory Visit: Payer: 59

## 2019-12-22 ENCOUNTER — Other Ambulatory Visit: Payer: Self-pay

## 2019-12-22 DIAGNOSIS — Z170 Estrogen receptor positive status [ER+]: Secondary | ICD-10-CM

## 2019-12-22 NOTE — Progress Notes (Signed)
Referral successfully faxed to Dr. Para Skeans office at [phone].

## 2019-12-26 ENCOUNTER — Ambulatory Visit (INDEPENDENT_AMBULATORY_CARE_PROVIDER_SITE_OTHER): Payer: 59 | Admitting: Bariatrics

## 2019-12-30 ENCOUNTER — Other Ambulatory Visit (INDEPENDENT_AMBULATORY_CARE_PROVIDER_SITE_OTHER): Payer: Self-pay | Admitting: Bariatrics

## 2019-12-30 DIAGNOSIS — E559 Vitamin D deficiency, unspecified: Secondary | ICD-10-CM

## 2020-01-02 ENCOUNTER — Ambulatory Visit: Payer: 59 | Admitting: Orthopaedic Surgery

## 2020-01-03 ENCOUNTER — Ambulatory Visit
Admission: RE | Admit: 2020-01-03 | Discharge: 2020-01-03 | Disposition: A | Discharge status: Home / Self Care | Payer: 59 | Source: Ambulatory Visit | Attending: Oncology | Admitting: Oncology

## 2020-01-03 ENCOUNTER — Other Ambulatory Visit: Payer: Self-pay | Admitting: Oncology

## 2020-01-03 DIAGNOSIS — N6320 Unspecified lump in the left breast, unspecified quadrant: Secondary | ICD-10-CM

## 2020-01-03 IMAGING — US US BREAST*L* LIMITED INC AXILLA
1 series · 13 of 19 positions shown · non-contrast
Comparison: Previous exam(s).

CLINICAL DATA: 58-year-old female with history of left breast
cancer status post lumpectomy in [SS]. History of a right breast
papilloma. Patient had a recent benign left breast stereotactic
biopsy for calcifications at the lumpectomy site. She presents today
with a increased thickening at the biopsy site as well as clear left
nipple discharge. She also reports possible intermittent warmth at
the biopsy site.

EXAM:
ULTRASOUND OF THE LEFT BREAST

[Series 1: us breast*left* limited inc axilla · 0.07mm/px · 13 of 19 slices shown]
[im 1/19]
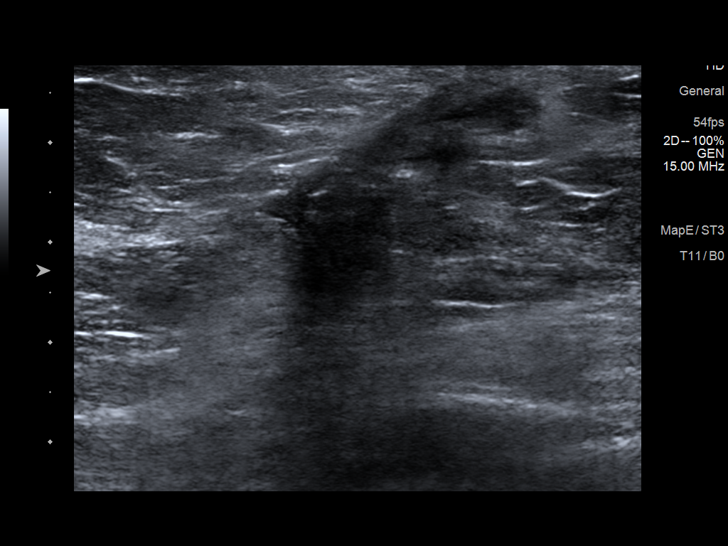
[im 3/19]
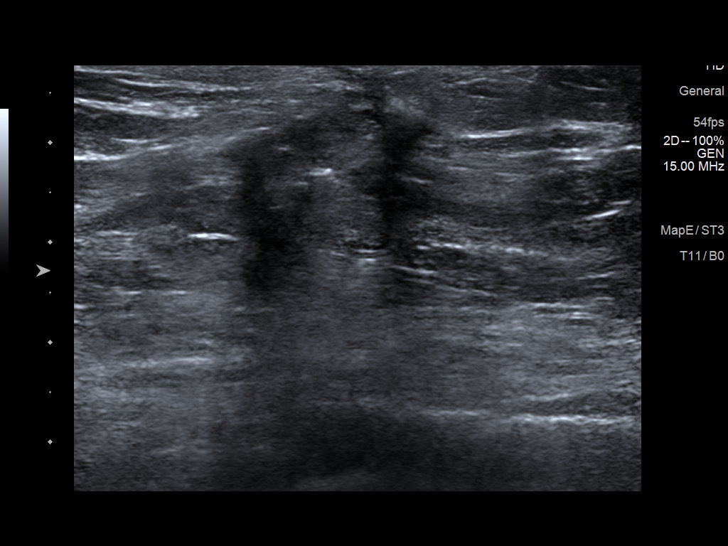
[im 4/19]
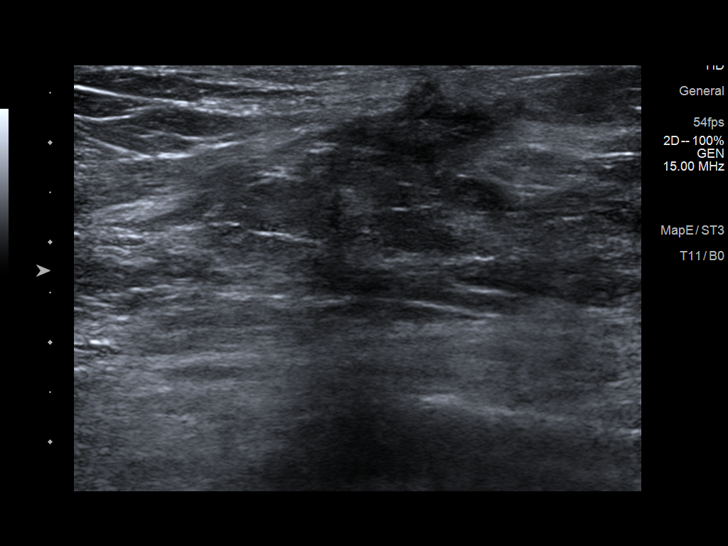
[im 6/19]
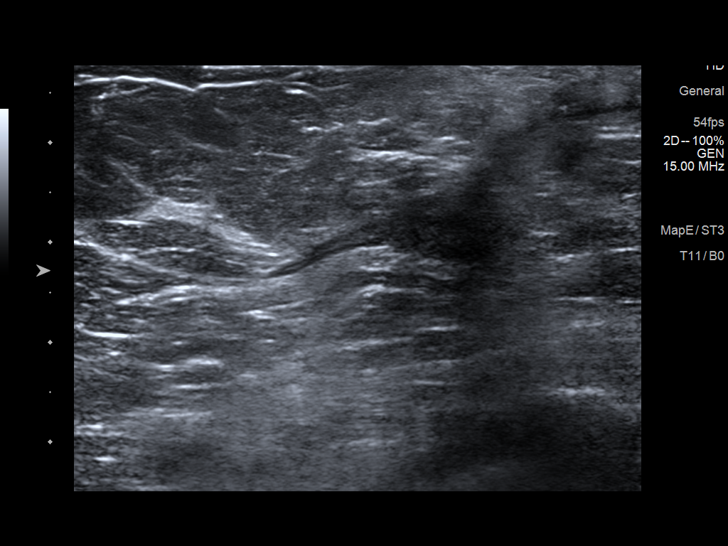
[im 7/19]
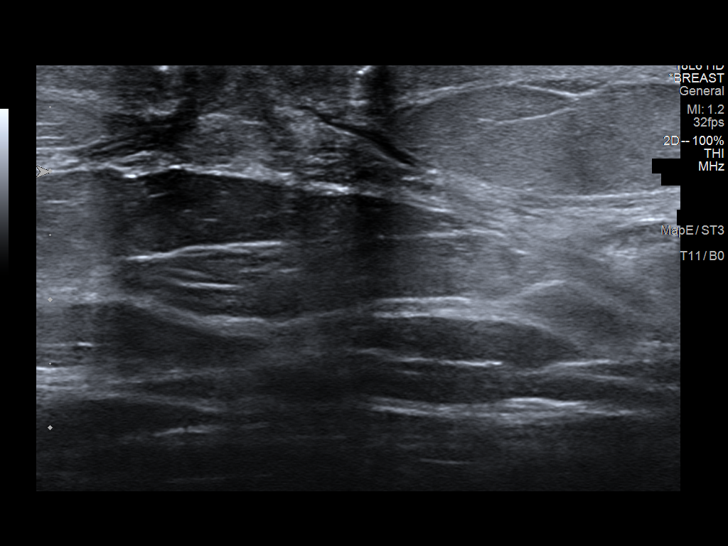
[im 9/19]
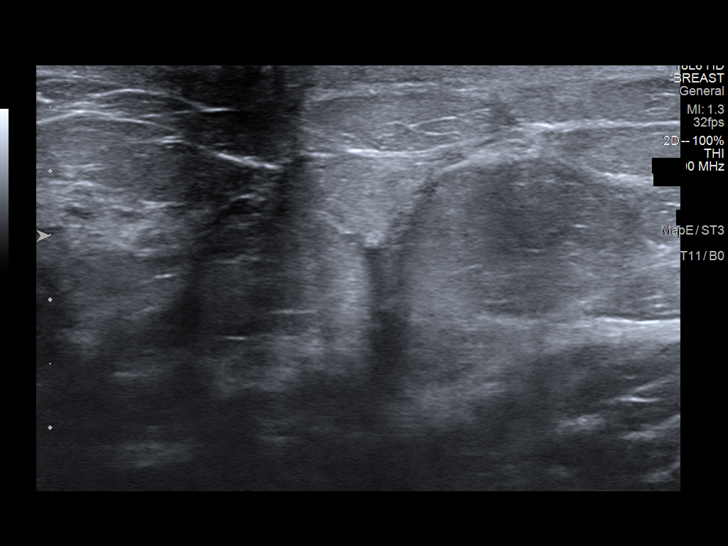
[im 10/19]
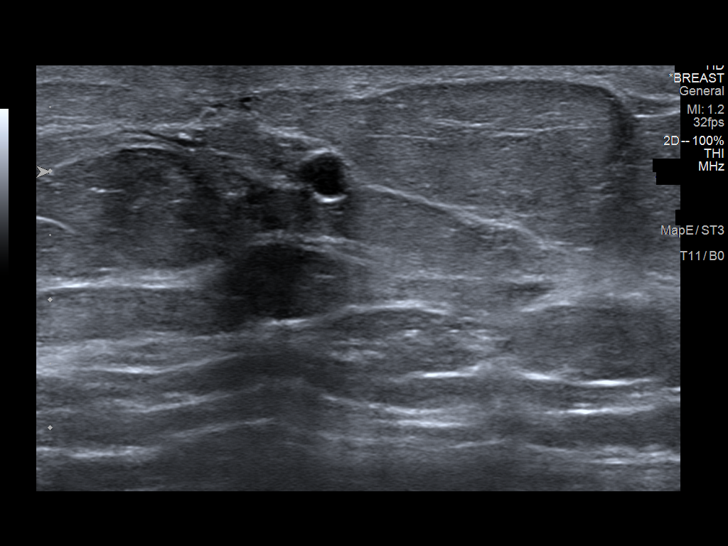
[im 11/19]
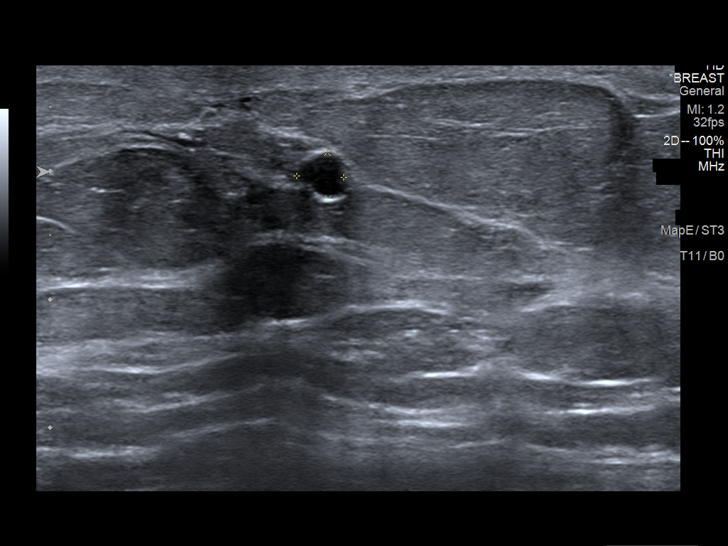
[im 13/19]
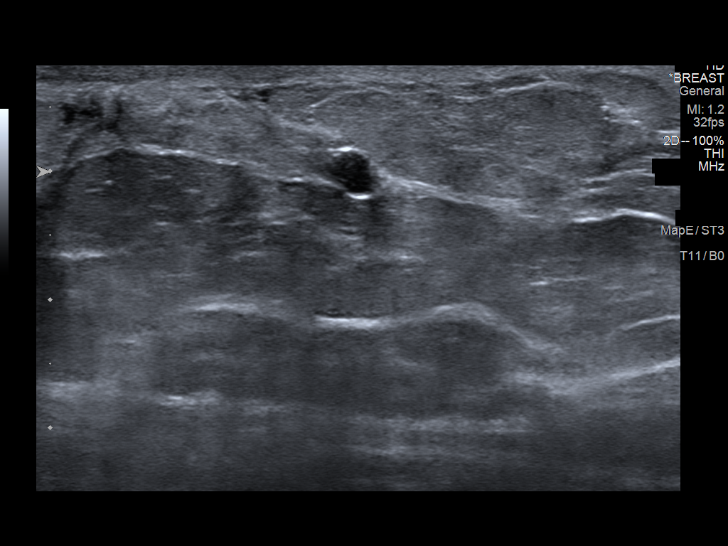
[im 14/19]
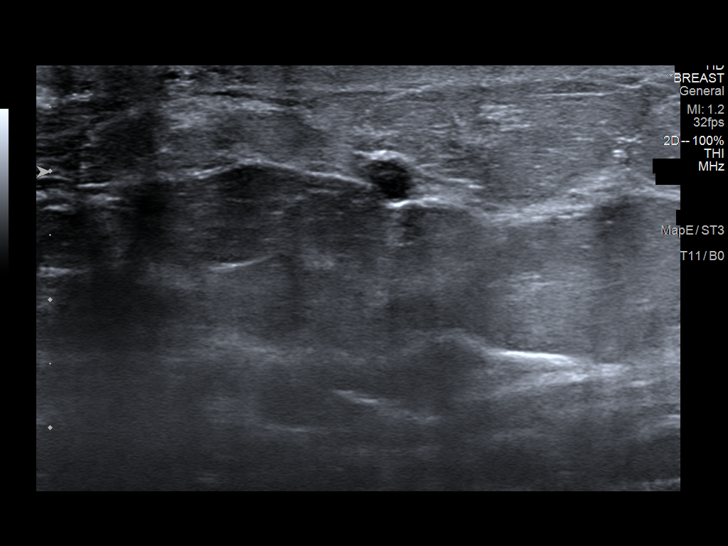
[im 16/19]
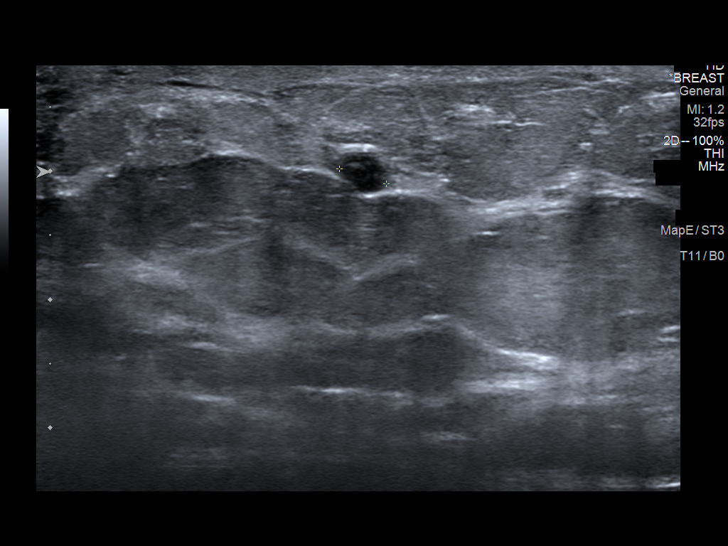
[im 17/19]
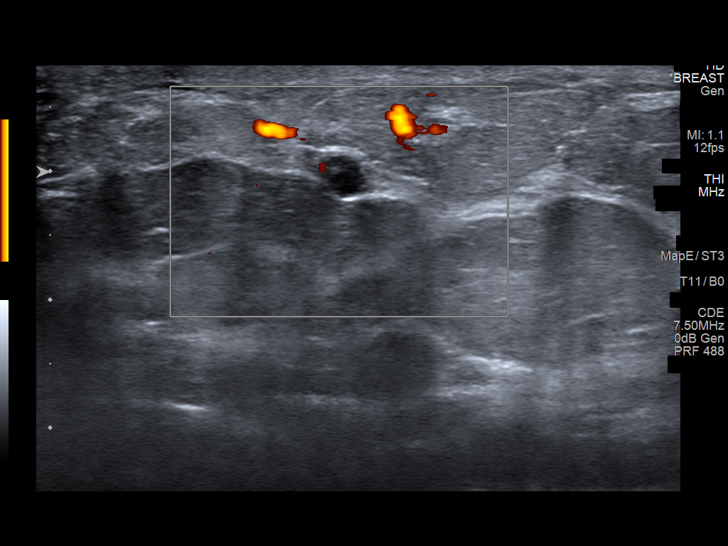
[im 19/19]
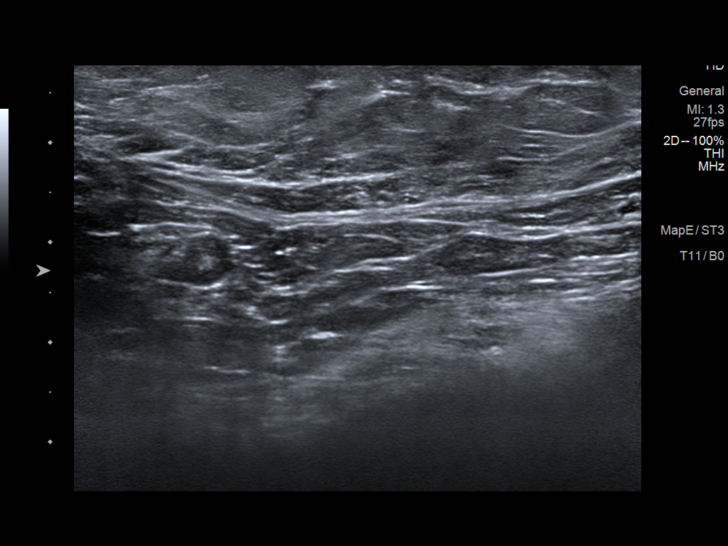

[13 of 19 positions shown; findings below may reference images not displayed]

FINDINGS: On physical exam, I do palpate thickening at the lumpectomy site in
the outer left breast which is likely postsurgical. I do not feel a
fixed discrete mass. There are no overt skin changes or erythema.

Targeted ultrasound is performed in the left breast at 2 o'clock 8
cm from nipple at the surgical site and palpable area of concern
reported by the patient. There is evidence of scarring and biopsy
site changes in the tissue without a discrete mass. No focal fluid
collection.

Targeted ultrasound is performed in the retroareolar left breast
demonstrating no dilated ducts but there is a small oval hypoechoic
mass at 6 o'clock 1 cm from the nipple measuring 0.4 x 0.4 x 0.4 cm.
This could be intraductal however there is not a definite ducts
connecting the mass packed the nipple. Small amount of peripheral
blood flow. Targeted ultrasound of the left axilla demonstrates
normal-appearing lymph nodes.
IMPRESSION: 1. At the palpable site of concern reported by the patient at the
left breast lumpectomy site there is scarring and post biopsy
changes without a discrete mass or fluid collection. No clinical or
imaging signs of infection.

2. In the left breast at 6 o'clock there is a small mass measuring
0.4 cm which may be intraductal. The patient has new left nipple
clear discharge.

RECOMMENDATION:
1. Ultrasound-guided core needle biopsy of the left breast mass at 6
o'clock. If the biopsy returns benign and there is no clear reason
for the patient's left nipple discharge, MRI may be considered.

2. Patient was counseled to continue monitoring the left
lumpectomy/biopsy site.

I have discussed the findings and recommendations with the patient.
If applicable, a reminder letter will be sent to the patient
regarding the next appointment.

BI-RADS CATEGORY  4: Suspicious.

## 2020-01-09 ENCOUNTER — Other Ambulatory Visit: Payer: Self-pay | Admitting: Oncology

## 2020-01-10 ENCOUNTER — Other Ambulatory Visit: Payer: Self-pay | Admitting: Oncology

## 2020-01-10 ENCOUNTER — Encounter: Payer: Self-pay | Admitting: Oncology

## 2020-01-10 NOTE — Progress Notes (Unsigned)
Bethany [name] called leaving a message stating she really wanted a PET scan as soon as possible.  As she is scheduled for breast biopsy 1102 at the [facility].  I have not been able to reach her today as both her home and cell phone send me to voicemail and the voicemails are full in both places.  Send her a letter and offered her an appointment here 01/26/2020.

## 2020-01-11 ENCOUNTER — Telehealth: Payer: Self-pay | Admitting: Oncology

## 2020-01-11 NOTE — Telephone Encounter (Signed)
Scheduled appt per 10/27 sch msg - unable to reach pt . Left message for patient with appt date and time

## 2020-01-16 ENCOUNTER — Other Ambulatory Visit: Payer: Self-pay

## 2020-01-16 ENCOUNTER — Ambulatory Visit
Admission: RE | Admit: 2020-01-16 | Discharge: 2020-01-16 | Disposition: A | Discharge status: Home / Self Care | Payer: 59 | Source: Ambulatory Visit | Attending: Oncology | Admitting: Oncology

## 2020-01-16 DIAGNOSIS — N6320 Unspecified lump in the left breast, unspecified quadrant: Secondary | ICD-10-CM

## 2020-01-16 IMAGING — US US BREAST BX W LOC DEV 1ST LESION IMG BX SPEC US GUIDE*L*
1 series · 9 of 9 positions shown · non-contrast
Comparison: Previous exam(s).
COMPARISON: Previous exam(s).

Addendum:
CLINICAL DATA: 58-year-old female presenting for ultrasound-guided
biopsy of a left breast mass.

EXAM:
ULTRASOUND GUIDED LEFT BREAST CORE NEEDLE BIOPSY

[Series 1: us breast bx w loc dev 1st lesion img bx spec us g · 0.06mm/px · 9 of 9 slices shown]
[im 1/9]
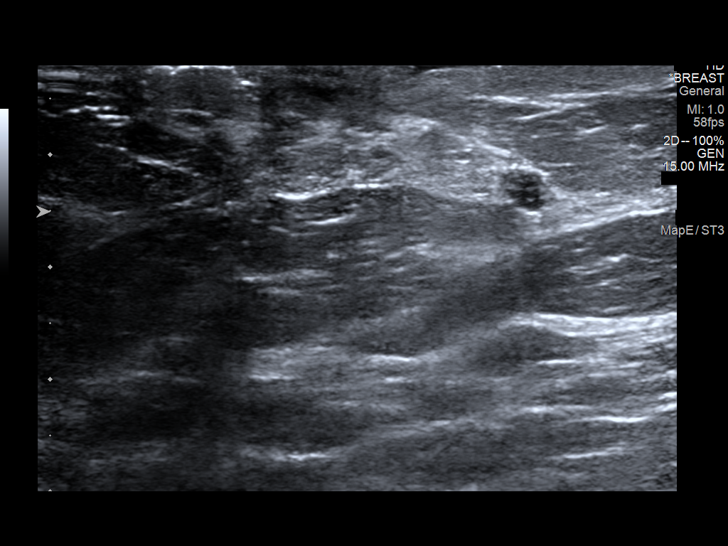
[im 2/9]
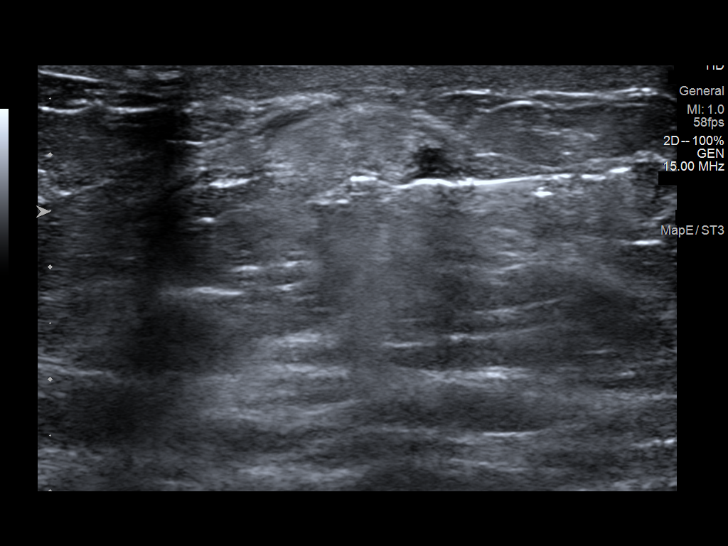
[im 3/9]
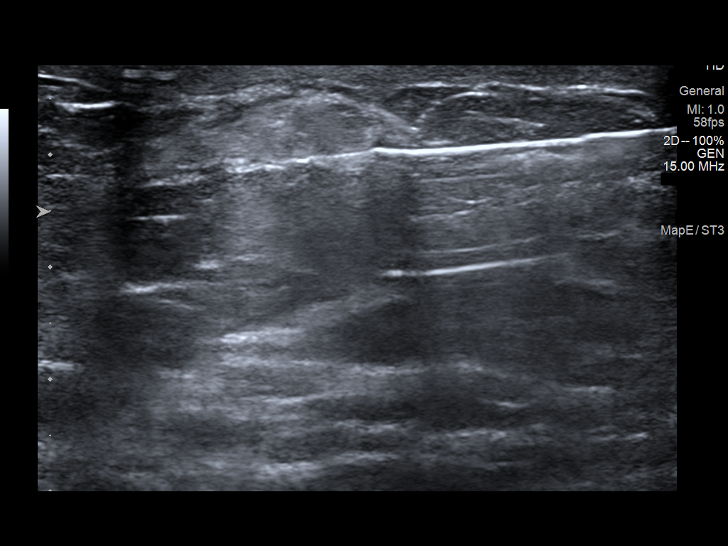
[im 4/9]
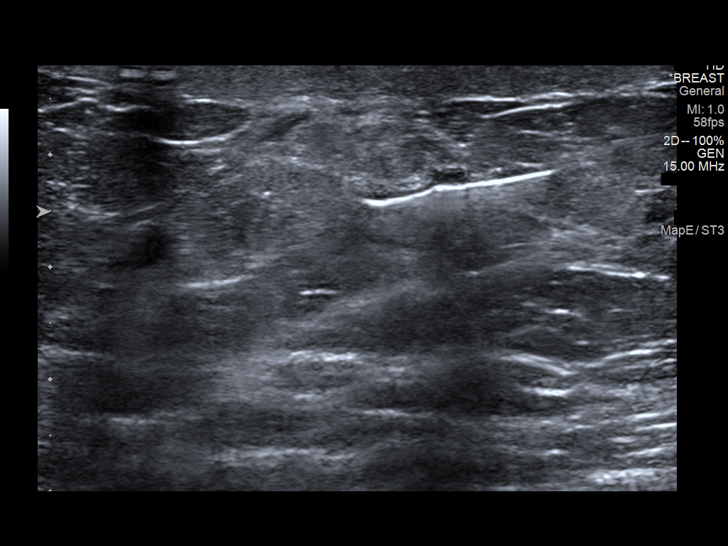
[im 5/9]
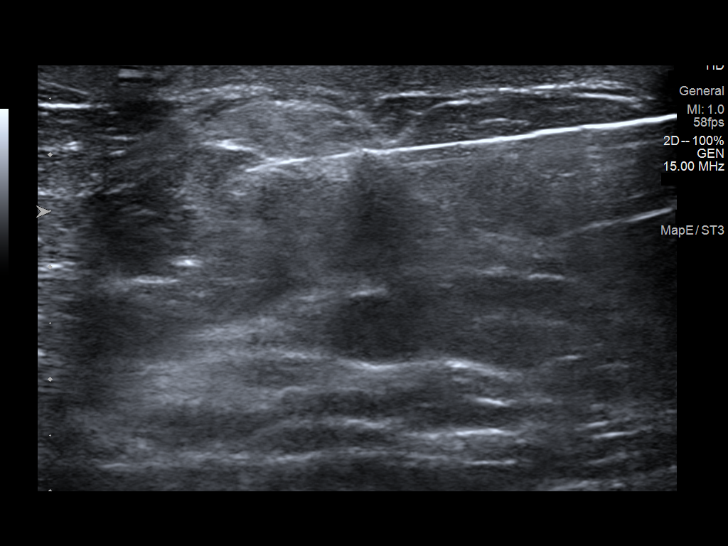
[im 6/9]
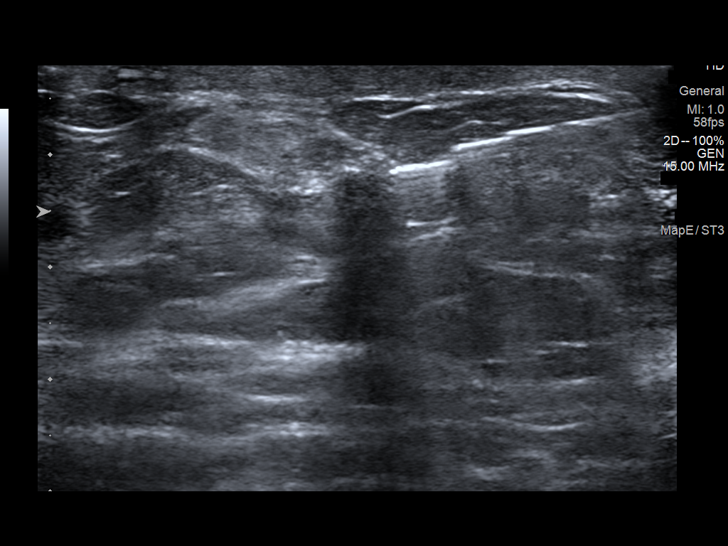
[im 7/9]
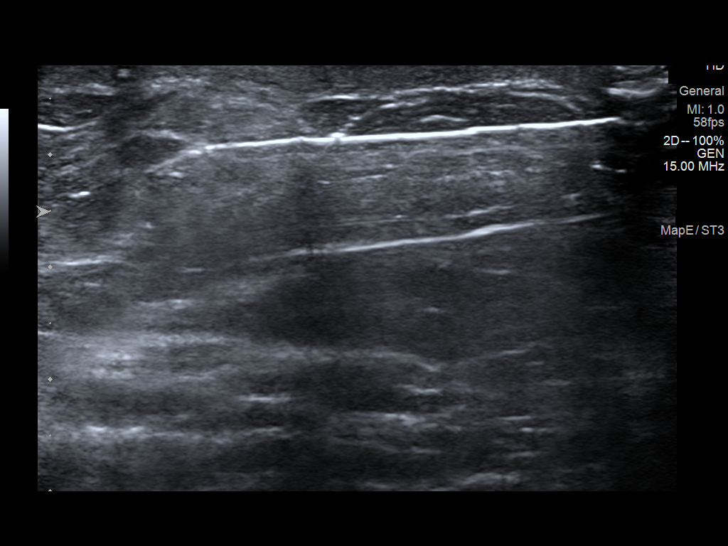
[im 8/9]
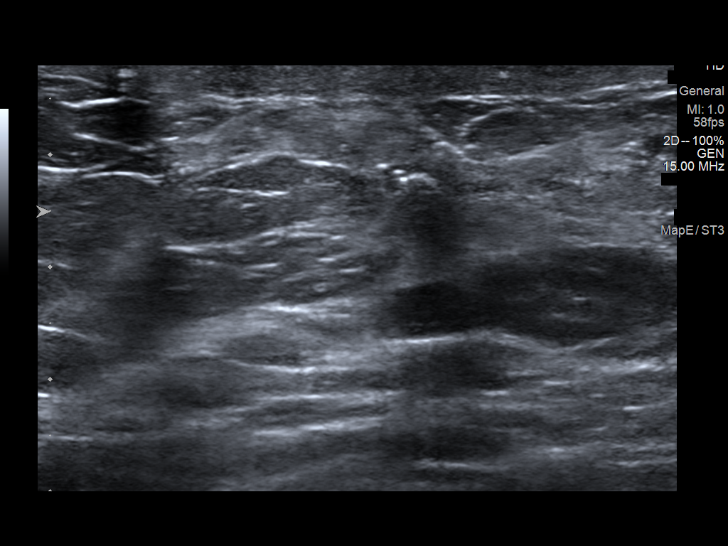
[im 9/9]
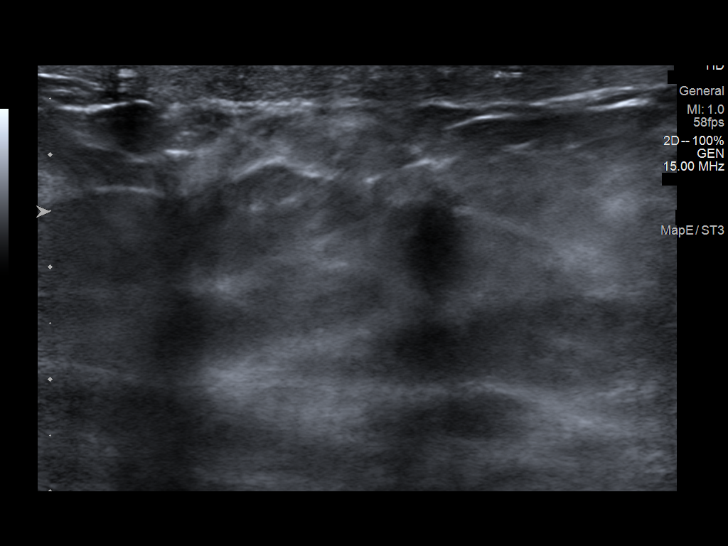

[9 of 9 positions shown; findings below may reference images not displayed]



Lesion quadrant: Lower outer quadrant

Using sterile technique and 1% Lidocaine as local anesthetic, under
direct ultrasound visualization, a 14 gauge ARIZONA device was
used to perform biopsy of a mass in the left breast at 6 o'clock
using an inferior approach. At the conclusion of the procedure a
ribbon shaped tissue marker clip was deployed into the biopsy
cavity. Follow up 2 view mammogram was performed and dictated
separately.
IMPRESSION: Ultrasound guided biopsy of a left breast mass at 6 o'clock. No
apparent complications.

ADDENDUM:
Pathology revealed COLUMNAR CELL AND FIBROCYSTIC CHANGES WITH
CALCIFICATIONS- FEATURES CONSISTENT WITH RUPTURED CYST of the LEFT
breast, 6:00 o'clock. This was found to be concordant by Dr.
ARIZONA.

Pathology results were discussed with the patient by telephone. The
patient reported doing well after the biopsy with tenderness at the
site. Post biopsy instructions and care were reviewed and questions
were answered. The patient was encouraged to call The [REDACTED]

Consider further evaluation of left nipple discharge with MRI.
Continue annual diagnostic mammograms post lumpectomy.

Pathology results reported by ARIZONA RN on [DATE].



Lesion quadrant: Lower outer quadrant

Using sterile technique and 1% Lidocaine as local anesthetic, under
direct ultrasound visualization, a 14 gauge ARIZONA device was
used to perform biopsy of a mass in the left breast at 6 o'clock
using an inferior approach. At the conclusion of the procedure a
ribbon shaped tissue marker clip was deployed into the biopsy
cavity. Follow up 2 view mammogram was performed and dictated
separately.
IMPRESSION: Ultrasound guided biopsy of a left breast mass at 6 o'clock. No
apparent complications.

## 2020-01-16 IMAGING — MG MM BREAST LOCALIZATION CLIP
4 series · 4 of 12 positions shown · non-contrast
Comparison: Previous exam(s).

CLINICAL DATA: Post biopsy mammogram of the left breast for clip
placement.

EXAM:
DIAGNOSTIC LEFT MAMMOGRAM POST ULTRASOUND BIOPSY

[L ML synth-2D]
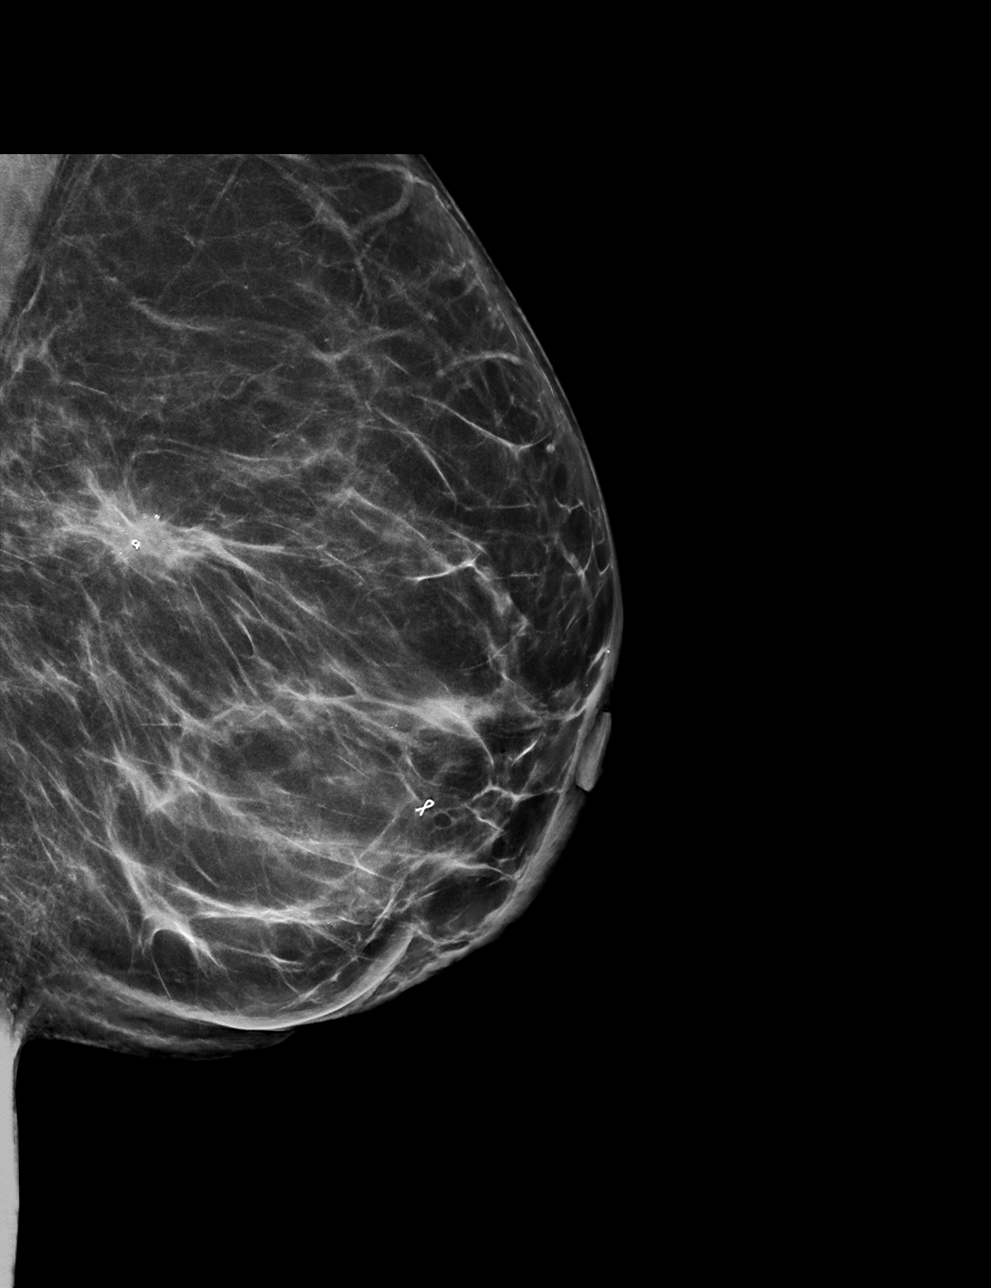

[L CC synth-2D]
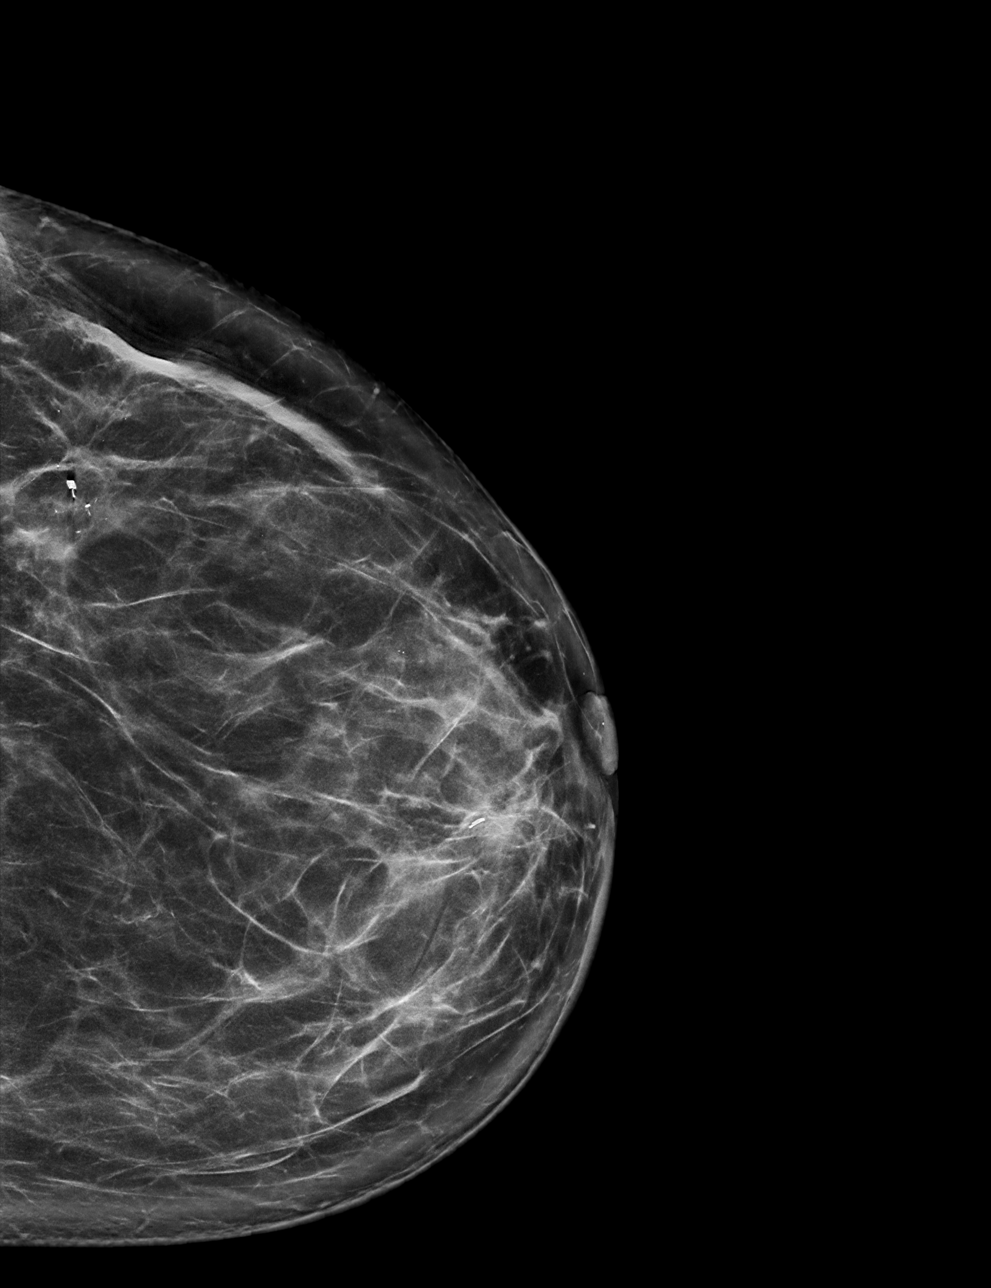

[L ML tomo · tomo slice 39/77.0]
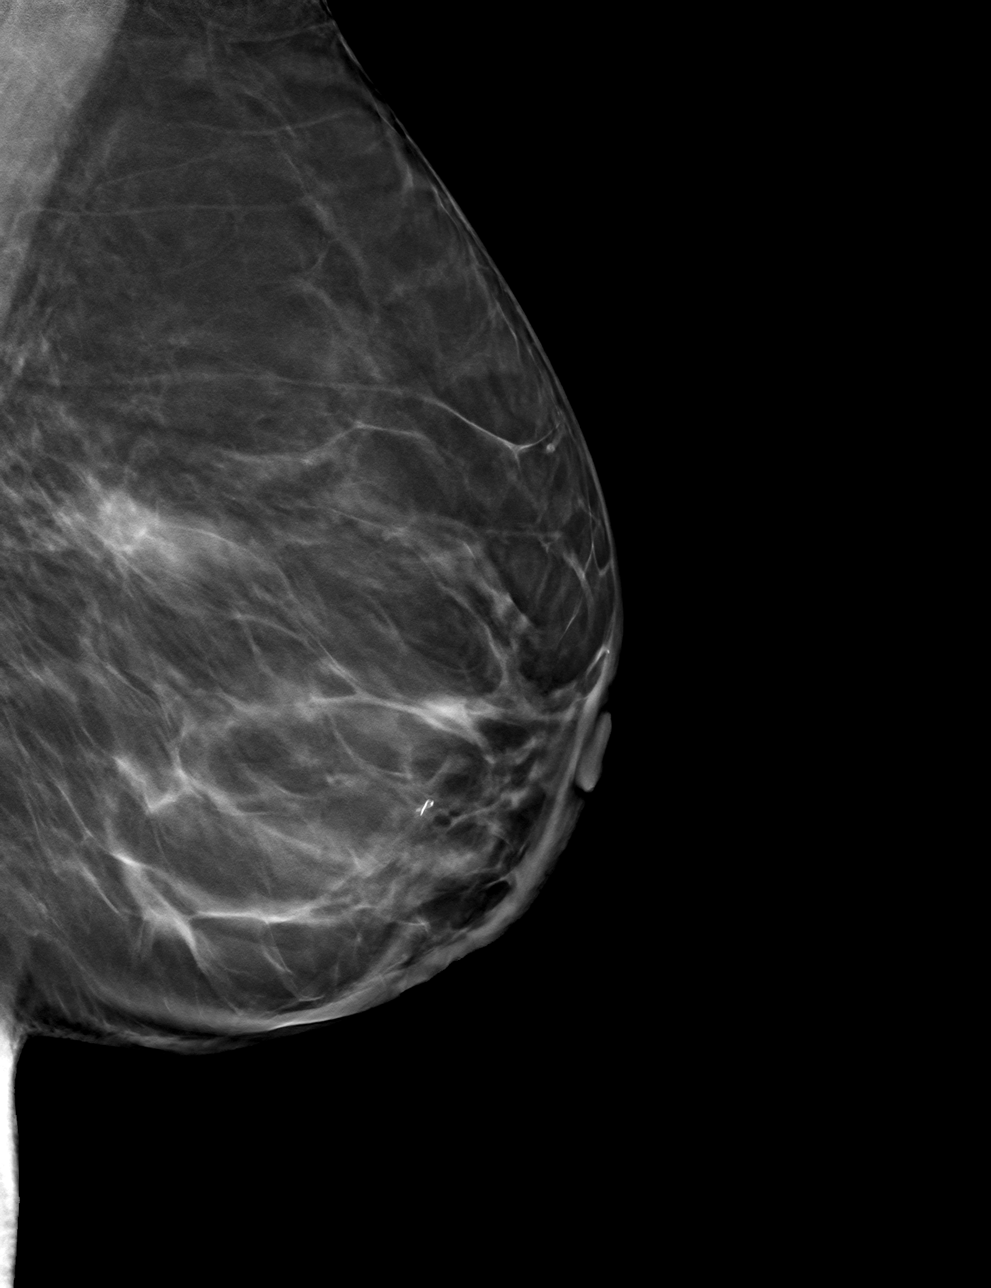

[L CC tomo · tomo slice 37/74.0]
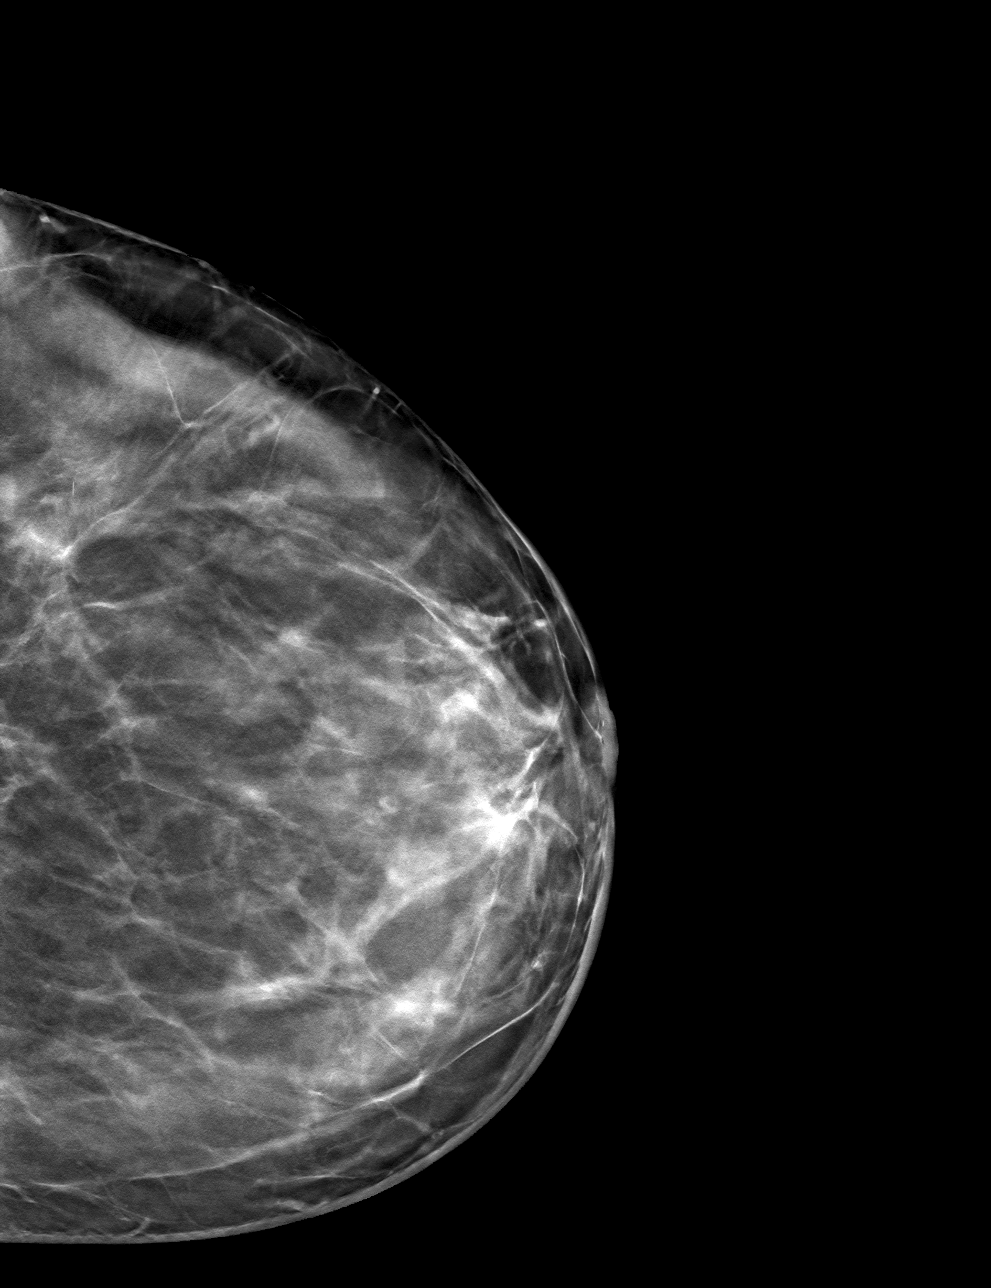

[4 of 12 positions shown; findings below may reference images not displayed]

FINDINGS: Mammographic images were obtained following ultrasound guided biopsy
of a mass in the left breast at 6 o'clock. The biopsy marking clip
is in expected position at the site of biopsy.
IMPRESSION: Appropriate positioning of the ribbon shaped biopsy marking clip at
the site of biopsy in the inferior left breast.

Final Assessment: Post Procedure Mammograms for Marker Placement

## 2020-01-18 ENCOUNTER — Encounter: Payer: Self-pay | Admitting: Oncology

## 2020-01-24 MED ORDER — LOSARTAN POTASSIUM-HCTZ 50-12.5 MG PO TABS: 1.0000 | ORAL_TABLET | Freq: Every day | ORAL | 2 refills | Status: DC

## 2020-01-25 ENCOUNTER — Other Ambulatory Visit: Payer: Self-pay

## 2020-01-25 ENCOUNTER — Ambulatory Visit
Admission: RE | Admit: 2020-01-25 | Discharge: 2020-01-25 | Disposition: A | Discharge status: Home / Self Care | Payer: 59 | Source: Ambulatory Visit | Attending: Oncology | Admitting: Oncology

## 2020-01-25 DIAGNOSIS — I209 Angina pectoris, unspecified: Secondary | ICD-10-CM

## 2020-01-25 DIAGNOSIS — C50412 Malignant neoplasm of upper-outer quadrant of left female breast: Secondary | ICD-10-CM

## 2020-01-25 DIAGNOSIS — I158 Other secondary hypertension: Secondary | ICD-10-CM

## 2020-01-25 DIAGNOSIS — Z170 Estrogen receptor positive status [ER+]: Secondary | ICD-10-CM

## 2020-01-25 NOTE — Progress Notes (Signed)
[place]  Telephone:(336) [phone] Fax:(336) [phone]     ID: Bethany [name] DOB: 11/01/1960  MR#: [id number]  ZTI#:[id number]  Patient Care Team: Leeroy Cha, MD as PCP - General (Internal Medicine) Burnell Blanks, MD as PCP - Cardiology (Cardiology) Erroll Luna, MD as Consulting Physician (General Surgery) Abbye Lao, Virgie Dad, MD as Consulting Physician (Oncology) Kyung Rudd, MD as Consulting Physician (Radiation Oncology) Irene Shipper, MD as Consulting Physician (Gastroenterology) [name], MD as Consulting Physician (Obstetrics and Gynecology) Delrae Rend, MD as Consulting Physician (Endocrinology) Mauro Kaufmann, RN as Oncology Nurse Navigator Rockwell Germany, RN as Oncology Nurse Navigator Icard, Octavio Graves, DO as Consulting Physician (Pulmonary Disease) Mcarthur Rossetti, MD as Consulting Physician (Orthopedic Surgery) OTHER MD:   CHIEF COMPLAINT: Estrogen receptor positive breast cancer  CURRENT TREATMENT: Observation   INTERVAL HISTORY: Bethany [name] returns today for follow-up of her estrogen receptor positive breast cancer. She is now under observation due to her inability to tolerate antiestrogens.  She underwent biopsy of the left breast on 12/15/2019. Pathology [id number]) showed: prior procedure site changes with calcifications.  She presented with increased thickening at the biopsy site, as well as clear left nipple discharge. She underwent left breast ultrasound on 01/03/2020 showing: scarring and post-biopsy changes at site of palpable concern; 0.4 cm mass in left breast at 6 o'clock.  She underwent biopsy of the left breast mass on 01/16/2020. Pathology [id number]) showed: columnar cell and fibrocystic changes with calcifications; findings consistent with ruptured cyst.  Finally, she also underwent bone density screening yesterday, 01/25/2020.  This shows a T score of -1.7, mild to moderate  osteopenia   REVIEW OF SYSTEMS: Bethany [name] remains exceedingly anxious about the possibility of breast cancer.  She does not want to have constant biopsies.  At the same time she wants to have repeated studies for example a PET scan which would indeed mean more biopsies.  She wonders if she should have both her breast removed and be [name] with the problem but she understands that just having her breast removed does not mean the cancer cannot come back.  It is difficult for her and for me to tell if after bilateral mastectomies she would feel any less anxious than she does now.   COVID 19 VACCINATION STATUS: Status post Pfizer x2 most recently April 2021    HISTORY OF CURRENT ILLNESS: The original intake note:  "Bethany [name]" had routine screening mammography on 10/26/2017 showing a possible abnormality in the left breast. She underwent unilateral left diagnostic mammography with tomography and left breast ultrasonography at [facility] on 11/05/2017 showing: breast density category B. There was a hypoechoic lesion consistent of a mass at the 2:30 o'clock upper outer quadrant middle depth and measuring 0.5 x 0.3 x 0.4 cm. Sonographic evaluation of the left axilla shows no enlarged or abnormal lymph nodes.  An attempt was made to obtain a biopsy of this lesion on 10/30/2017, however the patient had repeated episodes of syncope during the attempted procedure.  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 Margins were negative for carcinoma. Prognostic indicators significant for: estrogen receptor, 90% positive and progesterone receptor, 80% positive, both with strong staining intensity. Proliferation marker Ki67 at 3%. HER2 negative with an immunohistochemistry of (1+).  Then in a separate procedure she underwent left sentinel lymph node sampling on 12/09/2017 showing ([id number]): Four left axillary sentinel lymph were  negative for carcinoma. (0/4).  Her subsequent history is as detailed below.   PAST MEDICAL HISTORY: Past Medical History:  Diagnosis Date  . ADD (attention deficit disorder)   . Anxiety   . B12 deficiency   . Breast cancer (HCC) 2019   left breast cancer/diagnosed in 10/2017/taking radiation treatment until 03/15/18  . Breast mass, left 11/2017   going thru radiation until 02/2018  . CAD (coronary artery disease)    a. DES to LAD 06/2019.  Marland Kitchen Chest pain 06/19/2014  . COPD (chronic obstructive pulmonary disease) (HCC)    beginning stages/small scar  . Dental crowns present   . Depression   . Hiatal hernia   . History of hiatal hernia    no current med.  Marland Kitchen History of thyroid cancer 12/15/2017  . Hypertension    states under control with med., has been on med. x 1 yr.  . Hypothyroidism   . Malignant neoplasm of upper-outer quadrant of left breast in female, estrogen receptor positive (HCC)   . Mild hyperlipidemia   . 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 MVP (mitral valve prolapse)   . Personal history of radiation therapy 2019   Left Breast Cancer  . PONV (postoperative nausea and vomiting)   . Radiation fibrosis of lung (HCC)   . Syncope and collapse 12/25/2008   Qualifier: Diagnosis of  By: Mercer Pod    . Urinary incontinence    USI   . Vitamin D deficiency   Thyroid cancer, GERD   PAST SURGICAL HISTORY: Past Surgical History:  Procedure Laterality Date  . ABDOMINAL HYSTERECTOMY     partial  . APPENDECTOMY    . AXILLARY SENTINEL NODE BIOPSY Left 12/09/2017   Procedure: AXILLARY SENTINEL NODE BIOPSY;  Surgeon: Harriette Bouillon, MD;  Location: [facility];  Service: General;  Laterality: Left;  . BREAST EXCISIONAL BIOPSY Right 09/20/2019  . BREAST LUMPECTOMY Left 11/2017  . BREAST LUMPECTOMY WITH NEEDLE LOCALIZATION Left 11/25/2017   Procedure: LEFT BREAST LUMPECTOMY WITH NEEDLE LOCALIZATION;  Surgeon: Harriette Bouillon, MD;  Location: [facility];  Service: General;  Laterality: Left;  . BREAST LUMPECTOMY WITH RADIOACTIVE SEED LOCALIZATION Right 09/20/2019   Procedure: RIGHT BREAST LUMPECTOMY WITH RADIOACTIVE SEED LOCALIZATION;  Surgeon: Harriette Bouillon, MD;  Location: [facility];  Service: General;  Laterality: Right;  . COLONOSCOPY WITH PROPOFOL  10/03/2015  . CORONARY STENT INTERVENTION N/A 06/29/2019   Procedure: CORONARY STENT INTERVENTION;  Surgeon: Kathleene Hazel, MD;  Location[facility];  Service: Cardiovascular;  Laterality: N/A;  . LAPAROSCOPIC APPENDECTOMY N/A 10/03/2015   Procedure: APPENDECTOMY LAPAROSCOPIC;  Surgeon: Emelia Loron, MD;  Location: [facility];  Service: General;  Laterality: N/A;  . LEFT HEART CATH AND CORONARY ANGIOGRAPHY N/A 06/29/2019   Procedure: LEFT HEART CATH AND CORONARY ANGIOGRAPHY;  Surgeon: Kathleene Hazel, MD;  Location: [facility];  Service: Cardiovascular;  Laterality: N/A;  . TOTAL THYROIDECTOMY  04/02/2003  Hysterectomy without BSO   FAMILY HISTORY Family History  Problem Relation Age of Onset  . Hypertension Father   . Heart disease Father   . Non-Hodgkin's lymphoma Father   . Cancer Father   . Depression Father   . Anxiety disorder Father   . Alcoholism Father   . Hypertension Brother   . Heart disease Paternal Grandfather   . Colon cancer Other        great maternal aunt   . Colon polyps Mother   . High Cholesterol Mother   .  Alcoholism Mother   . Stomach cancer Neg Hx   . Rectal cancer Neg Hx   . Esophageal cancer Neg Hx    The patient's father died in Apr 15, 2017 age age 26 due to non-Hodgkin's lymphoma. The patient's mother is alive at age 38 as of October 2019. The patient has 1 brother, no sisters. There was a paternal aunt with lung cancer. There was a paternal grandmother and maternal grandfather with throat cancer. She denies a family history of breast or ovarian cancer.    GYNECOLOGIC HISTORY:  No LMP  recorded. Patient has had a hysterectomy. Menarche: 59 years old Age at first live birth: 59 years old She is GX P3.  She is status post partial hysterectomy (without BSO) in her late 60's. She did not have HRT.    SOCIAL HISTORY: (updated 09/2018) Kenise owns a jewelry business. Her husband, Patrick Jupiter, is a Clinical cytogeneticist for [employer]. At home is her, her husband, and their two dogs. The patient's oldest son, [place], has 3 children and lives in [place] and works in Nurse, children's for [employer]. The patient's son, Alpha Gula lives in [place] with 1 daughter and works as a Teacher, music. The patient's youngest son, Aline Brochure lives in [place] and works for [employer] in [place]. The patient has 5 grandchildren total. Her grandchildren are ages 27, 55, 34, 50, and 3. She notes the youngest has Down Syndrome and recently turned 3. She does not currently belong to a church, but she is [place].     ADVANCED DIRECTIVES: In the absence of any documentation to the contrary, the patient's spouse is their HCPOA.    HEALTH MAINTENANCE: Social History   Tobacco Use  . Smoking status: Former Smoker    Quit date: 08/14/2014    Years since quitting: 5.4  . Smokeless tobacco: Never Used  Vaping Use  . Vaping Use: Never used  Substance Use Topics  . Alcohol use: Yes    Comment: rarely  . Drug use: No    Colonoscopy: 03/15/2018- to be repeated in 3 years under Dr. Henrene Pastor  PAP:   Bone density: 10/25/2017 showed osteopenia   Allergies  Allergen Reactions  . Oxycodone Nausea And Vomiting  . Latex Rash  . Other Rash    "Sutures caused severe rash."  . Sulfa Antibiotics     hives    Current Outpatient Medications  Medication Sig Dispense Refill  . albuterol (VENTOLIN HFA) 108 (90 Base) MCG/ACT inhaler Inhale 2 puffs into the lungs every 6 (six) hours as needed for wheezing or shortness of breath. 18 g 6  . aspirin EC 81 MG tablet Take 81 mg by mouth daily.    . clopidogrel (PLAVIX) 75 MG tablet Take 1  tablet (75 mg total) by mouth daily. 90 tablet 3  . isosorbide mononitrate (IMDUR) 30 MG 24 hr tablet Take 0.5 tablets (15 mg total) by mouth daily. 45 tablet 3  . levothyroxine (SYNTHROID, LEVOTHROID) 137 MCG tablet Take 137 mcg by mouth at bedtime.     Marland Kitchen losartan-hydrochlorothiazide (HYZAAR) 50-12.5 MG tablet Take 1 tablet by mouth daily. 90 tablet 2  . Multiple Vitamin (MULTIVITAMIN WITH MINERALS) TABS tablet Take 1 tablet by mouth daily.    . nitroGLYCERIN (NITROSTAT) 0.4 MG SL tablet Place 1 tablet (0.4 mg total) under the tongue every 5 (five) minutes as needed for chest pain. 25 tablet 3  . Phentermine-Topiramate 7.5-46 MG CP24 1 capsule daily in the am 30 capsule 0  . rosuvastatin (CRESTOR) 40 MG  tablet Take 1 tablet (40 mg total) by mouth daily. 90 tablet 3  . venlafaxine XR (EFFEXOR-XR) 150 MG 24 hr capsule TAKE 1 CAPSULE (150 MG) BY MOUTH ONCE A DAY WITH BREAKFAST 90 capsule 5  . Vitamin D, Ergocalciferol, (DRISDOL) 1.25 MG (50000 UNIT) CAPS capsule Take 1 capsule (50,000 Units total) by mouth every 7 (seven) days. 4 capsule 0   No current facility-administered medications for this visit.    OBJECTIVE: White woman who appears stated age  59:   01/26/20 1315  BP: (!) 148/93  Pulse: 69  Resp: 18  Temp: 98.9 F (37.2 C)  SpO2: 97%     Body mass index is 32.23 kg/m.   Wt Readings from Last 3 Encounters:  01/26/20 199 lb 11.2 oz (90.6 kg)  12/05/19 193 lb (87.5 kg)  11/16/19 194 lb (88 kg)      ECOG FS:1 - Symptomatic but completely ambulatory  Sclerae unicteric, EOMs intact Wearing a mask No cervical or supraclavicular adenopathy Lungs no rales or rhonchi Heart regular rate and rhythm Abd soft, nontender, positive bowel sounds MSK no focal spinal tenderness, no upper extremity lymphedema Neuro: nonfocal, well oriented, appropriate affect Breasts: The right breast is unremarkable.  The left breast is status post recent biopsy.  There is a palpable mass in the  lateral aspect of the left breast which has been evaluated and found to be benign.  Both axillae are benign.    LAB RESULTS:  CMP     Component Value Date/Time   NA 140 10/12/2019 0812   NA 141 09/19/2019 1155   K 4.2 10/12/2019 0812   CL 105 10/12/2019 0812   CO2 27 10/12/2019 0812   GLUCOSE 97 10/12/2019 0812   BUN 17 10/12/2019 0812   BUN 11 09/19/2019 1155   CREATININE 0.86 10/12/2019 0812   CREATININE 0.78 12/16/2017 1540   CALCIUM 9.7 10/12/2019 0812   PROT 6.7 10/12/2019 0812   PROT 6.5 09/19/2019 1155   ALBUMIN 3.8 10/12/2019 0812   ALBUMIN 4.3 09/19/2019 1155   AST 20 10/12/2019 0812   AST 12 (L) 12/16/2017 1540   ALT 27 10/12/2019 0812   ALT 16 12/16/2017 1540   ALKPHOS 78 10/12/2019 0812   BILITOT 0.4 10/12/2019 0812   BILITOT 0.3 09/19/2019 1155   BILITOT 0.4 12/16/2017 1540   GFRNONAA >60 10/12/2019 0812   GFRNONAA >60 12/16/2017 1540   GFRAA >60 10/12/2019 0812   GFRAA >60 12/16/2017 1540    No results found for: TOTALPROTELP, ALBUMINELP, A1GS, A2GS, BETS, BETA2SER, GAMS, MSPIKE, SPEI  No results found for: KPAFRELGTCHN, LAMBDASER, KAPLAMBRATIO  Lab Results  Component Value Date   WBC 5.5 10/12/2019   NEUTROABS 2.9 10/12/2019   HGB 12.2 10/12/2019   HCT 38.0 10/12/2019   MCV 93.4 10/12/2019   PLT 262 10/12/2019   No results found for: LABCA2  No components found for: EUMPNT614  No results for input(s): INR in the last 168 hours.  No results found for: LABCA2  No results found for: ERX540  No results found for: GQQ761  No results found for: PJK932  No results found for: CA2729  No components found for: HGQUANT  No results found for: CEA1 / No results found for: CEA1   No results found for: AFPTUMOR  No results found for: CHROMOGRNA  No results found for: HGBA, HGBA2QUANT, HGBFQUANT, HGBSQUAN (Hemoglobinopathy evaluation)   No results found for: LDH  No results found for: IRON, TIBC, IRONPCTSAT (Iron and TIBC)  No results  found for: FERRITIN  Urinalysis    Component Value Date/Time   COLORURINE YELLOW 10/02/2015 2022   APPEARANCEUR CLOUDY (A) 10/02/2015 2022   LABSPEC 1.020 10/02/2015 2022   PHURINE 6.5 10/02/2015 2022   GLUCOSEU NEGATIVE 10/02/2015 2022   HGBUR NEGATIVE 10/02/2015 2022   [place] NEGATIVE 10/02/2015 2022   KETONESUR 15 (A) 10/02/2015 2022   PROTEINUR NEGATIVE 10/02/2015 2022   UROBILINOGEN 0.2 09/20/2014 1536   NITRITE NEGATIVE 10/02/2015 2022   LEUKOCYTESUR SMALL (A) 10/02/2015 2022     STUDIES: DG Bone Density  Result Date: 01/26/2020 EXAM: DUAL X-RAY ABSORPTIOMETRY (DXA) FOR BONE MINERAL DENSITY IMPRESSION: Referring Physician:  Chauncey Cruel Your patient completed a BMD test using Lunar IDXA DXA system ( analysis version: 16 ) manufactured by [employer]. Technologist: AW PATIENT: Name: Bethany [name], Bethany [name] Patient ID: [id number] Birth Date: 19-Jan-1961 Height: 65.0 in. Sex: Female Measured: 01/25/2020 Weight: 202.8 lbs. Indications: Breast Cancer History, Caucasian, Effexor, Estrogen Deficient, Hysterectomy, Levothyroxine, Postmenopausal, History of Fracture (Adult) (V15.51) Fractures: Elbow Treatments: Vitamin D (E933.5) ASSESSMENT: The BMD measured at Femur Neck Right is 0.800 g/cm2 with a T-score of -1.7. This patient is considered osteopenic according to [facility]) criteria.The scan quality is good. L-4 was excluded due to degenerative changes. Site Region Measured Date Measured Age YA BMD Significant CHANGE T-score DualFemur Neck Right 01/25/2020    58.0         -1.7    0.800 g/cm2 AP Spine  L1-L3      01/25/2020    58.0         -0.7    1.080 g/cm2 DualFemur Total Mean 01/25/2020    58.0         -1.2    0.850 g/cm2 World Health Organization [facility]) criteria for post-menopausal, Caucasian Women: Normal       T-score at or above -1 SD Osteopenia   T-score between -1 and -2.5 SD Osteoporosis T-score at or below -2.5 SD RECOMMENDATION: 1. All patients should  optimize calcium and vitamin D intake. 2. Consider FDA approved medical therapies in postmenopausal women and men aged 75 years and older, based on the following: a. A hip or vertebral (clinical or morphometric) fracture b. T- score < or = -2.5 at the femoral neck or spine after appropriate evaluation to exclude secondary causes c. Low bone mass (T-score between -1.0 and -2.5 at the femoral neck or spine) and a 10 year probability of a hip fracture > or = 3% or a 10 year probability of a major osteoporosis-related fracture > or = 20% based on the [place]-adapted WHO algorithm d. Clinician judgment and/or patient preferences may indicate treatment for people with 10-year fracture probabilities above or below these levels FOLLOW-UP: Patients with diagnosis of osteoporosis or at high risk for fracture should have regular bone mineral density tests. For patients eligible for Medicare, routine testing is allowed once every 2 years. The testing frequency can be increased to one year for patients who have rapidly progressing disease, those who are receiving or discontinuing medical therapy to restore bone mass, or have additional risk factors. I have reviewed this report and agree with the above findings. [facility] FRAX* 10-year Probability of Fracture Based on femoral neck BMD: DualFemur (Right) Major Osteoporotic Fracture: 13.2% Hip Fracture:                1.3% Population:  [place] (Caucasian) Risk Factors:                History of Fracture (Adult) (V15.51) *FRAX is a [place] [employer] for Metabolic Bone Disease, a [place] (WHO) [facility]. ASSESSMENT: The probability of a major osteoporotic fracture is 13.2 % within the next ten years. The probability of a hip fracture is 1.3 % within the next ten years. Electronically Signed   By: Lowella Grip III M.D.   On: 01/26/2020 07:47   US BREAST LTD UNI LEFT INC  AXILLA  Result Date: 01/03/2020 CLINICAL DATA:  59 year old female with history of left breast cancer status post lumpectomy in 2019. History of a right breast papilloma. Patient had a recent benign left breast stereotactic biopsy for calcifications at the lumpectomy site. She presents today with a increased thickening at the biopsy site as well as clear left nipple discharge. She also reports possible intermittent warmth at the biopsy site. EXAM: ULTRASOUND OF THE LEFT BREAST COMPARISON:  Previous exam(s). FINDINGS: On physical exam, I do palpate thickening at the lumpectomy site in the outer left breast which is likely postsurgical. I do not feel a fixed discrete mass. There are no overt skin changes or erythema. Targeted ultrasound is performed in the left breast at 2 o'clock 8 cm from nipple at the surgical site and palpable area of concern reported by the patient. There is evidence of scarring and biopsy site changes in the tissue without a discrete mass. No focal fluid collection. Targeted ultrasound is performed in the retroareolar left breast demonstrating no dilated ducts but there is a small oval hypoechoic mass at 6 o'clock 1 cm from the nipple measuring 0.4 x 0.4 x 0.4 cm. This could be intraductal however there is not a definite ducts connecting the mass packed the nipple. Small amount of peripheral blood flow. Targeted ultrasound of the left axilla demonstrates normal-appearing lymph nodes. IMPRESSION: 1. At the palpable site of concern reported by the patient at the left breast lumpectomy site there is scarring and post biopsy changes without a discrete mass or fluid collection. No clinical or imaging signs of infection. 2. In the left breast at 6 o'clock there is a small mass measuring 0.4 cm which may be intraductal. The patient has new left nipple clear discharge. RECOMMENDATION: 1. Ultrasound-guided core needle biopsy of the left breast mass at 6 o'clock. If the biopsy returns benign and there  is no clear reason for the patient's left nipple discharge, MRI may be considered. 2. Patient was counseled to continue monitoring the left lumpectomy/biopsy site. I have discussed the findings and recommendations with the patient. If applicable, a reminder letter will be sent to the patient regarding the next appointment. BI-RADS CATEGORY  4: Suspicious. Electronically Signed   By: Audie Pinto M.D.   On: 01/03/2020 11:53   MM CLIP PLACEMENT LEFT  Result Date: 01/16/2020 CLINICAL DATA:  Post biopsy mammogram of the left breast for clip placement. EXAM: DIAGNOSTIC LEFT MAMMOGRAM POST ULTRASOUND BIOPSY COMPARISON:  Previous exam(s). FINDINGS: Mammographic images were obtained following ultrasound guided biopsy of a mass in the left breast at 6 o'clock. The biopsy marking clip is in expected position at the site of biopsy. IMPRESSION: Appropriate positioning of the ribbon shaped biopsy marking clip at the site of biopsy in the inferior left breast. Final Assessment: Post Procedure Mammograms for Marker Placement Electronically Signed   By: Ammie Ferrier M.D.   On: 01/16/2020  14:34   [place] LT BREAST BX W LOC DEV 1ST LESION IMG BX SPEC US GUIDE  Addendum Date: 01/17/2020   ADDENDUM REPORT: 01/17/2020 12:24 ADDENDUM: Pathology revealed COLUMNAR CELL AND FIBROCYSTIC CHANGES WITH CALCIFICATIONS- FEATURES CONSISTENT WITH RUPTURED CYST of the LEFT breast, 6:00 o'clock. This was found to be concordant by Dr. Ammie Ferrier. Pathology results were discussed with the patient by telephone. The patient reported doing well after the biopsy with tenderness at the site. Post biopsy instructions and care were reviewed and questions were answered. The patient was encouraged to call [facility] for any additional concerns. Consider further evaluation of left nipple discharge with MRI. Continue annual diagnostic mammograms post lumpectomy. Pathology results reported by Stacie Acres RN on  01/17/2020. Electronically Signed   By: Ammie Ferrier M.D.   On: 01/17/2020 12:24   Result Date: 01/17/2020 CLINICAL DATA:  59 year old female presenting for ultrasound-guided biopsy of a left breast mass. EXAM: ULTRASOUND GUIDED LEFT BREAST CORE NEEDLE BIOPSY COMPARISON:  Previous exam(s). PROCEDURE: I met with the patient and we discussed the procedure of ultrasound-guided biopsy, including benefits and alternatives. We discussed the high likelihood of a successful procedure. We discussed the risks of the procedure, including infection, bleeding, tissue injury, clip migration, and inadequate sampling. Informed written consent was given. The usual time-out protocol was performed immediately prior to the procedure. Lesion quadrant: Lower outer quadrant Using sterile technique and 1% Lidocaine as local anesthetic, under direct ultrasound visualization, a 14 gauge spring-loaded device was used to perform biopsy of a mass in the left breast at 6 o'clock using an inferior approach. At the conclusion of the procedure a ribbon shaped tissue marker clip was deployed into the biopsy cavity. Follow up 2 view mammogram was performed and dictated separately. IMPRESSION: Ultrasound guided biopsy of a left breast mass at 6 o'clock. No apparent complications. Electronically Signed: By: Ammie Ferrier M.D. On: 01/16/2020 14:30     ELIGIBLE FOR AVAILABLE RESEARCH PROTOCOL: No   ASSESSMENT: 59 y.o. [name], [place] woman status post left breast upper outer quadrant lumpectomy 11/25/2017 for a pT1b pNX, stage Ia invasive ductal carcinoma, grade 1, estrogen and progesterone receptor positive, HER-2/neu negative, with an MIB-1 of 3%  (1) status post left axillary lymph node sampling 12/09/2017, all 4 sentinel lymph nodes clear  (2) Oncotype score of 24 predicts a 10 % risk of recurrence outside the breast over the next 9 years if the patient's only systemic therapy is an estrogens for 5 years.  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  (7) exemestane started 10/12/2019, discontinued 11/26/2019 with intolerable side effects  (a) bone density November 2021  (8) intensified screening:  (a) breast MRI February 2022   PLAN: Bethany [name] continues to be exceedingly anxious about breast cancer issues.  This is not in itself abnormal.  It is getting in her way and she  is not exercising regularly and basically her emotions are not controlled even though she is on venlafaxine 150 mg.  We clarified certain issues today.  There is absolutely no medical indication for her to have mastectomies.  She is fine with the breast she has, we are going to be doing intensified screening so she is doubly safe and if she does develop another cancer she can then consider bilateral mastectomy see if she wishes.  She understands that if she does have bilateral mastectomies that does not mean this cancer cannot recur.  She is considering D IEP reconstruction.  The question for her is whether after the great effort of going through that 9-hour surgery as she will feel any more confident in her prognosis then she does now.  My recommendation to her is that she not make any decision until the end of the holidays.  In February she will have breast MRI.  She will see me in March.  At that time we can decide if she still feels she needs to  have both her breast removed or if she is willing to live with what she has  She will benefit from some counseling and I will ask our chaplain if she can to contact her for this  Total encounter time 40 minutes.*  Terryl Molinelli, Virgie Dad, MD  01/26/20 1:55 PM Medical Oncology and Hematology [facility] [place], [place] [address] Tel. [phone]    Fax. [phone]   I, Wilburn Mylar, am acting as scribe for Dr. Virgie Dad. Marcos Ruelas.  I, Lurline Del MD, have reviewed the above documentation for accuracy and completeness, and I agree with the above.   *Total Encounter Time as defined by the Centers for Medicare and Medicaid Services includes, in addition to the face-to-face time of a patient visit (documented in the note above) non-face-to-face time: obtaining and reviewing outside history, ordering and reviewing medications, tests or procedures, care coordination (communications with other health care professionals or caregivers) and documentation in the medical record.

## 2020-01-26 ENCOUNTER — Other Ambulatory Visit: Payer: Self-pay

## 2020-01-26 ENCOUNTER — Encounter: Payer: Self-pay | Admitting: General Practice

## 2020-01-26 ENCOUNTER — Inpatient Hospital Stay: Payer: 59 | Attending: Oncology | Admitting: Oncology

## 2020-01-26 VITALS — BP 148/93 | HR 69 | Temp 98.9°F | Resp 18 | Ht 66.0 in | Wt 199.7 lb

## 2020-01-26 DIAGNOSIS — K449 Diaphragmatic hernia without obstruction or gangrene: Secondary | ICD-10-CM | POA: Insufficient documentation

## 2020-01-26 DIAGNOSIS — J449 Chronic obstructive pulmonary disease, unspecified: Secondary | ICD-10-CM | POA: Insufficient documentation

## 2020-01-26 DIAGNOSIS — Z7982 Long term (current) use of aspirin: Secondary | ICD-10-CM | POA: Insufficient documentation

## 2020-01-26 DIAGNOSIS — Z87891 Personal history of nicotine dependence: Secondary | ICD-10-CM | POA: Diagnosis not present

## 2020-01-26 DIAGNOSIS — Z170 Estrogen receptor positive status [ER+]: Secondary | ICD-10-CM | POA: Insufficient documentation

## 2020-01-26 DIAGNOSIS — I2510 Atherosclerotic heart disease of native coronary artery without angina pectoris: Secondary | ICD-10-CM | POA: Insufficient documentation

## 2020-01-26 DIAGNOSIS — F988 Other specified behavioral and emotional disorders with onset usually occurring in childhood and adolescence: Secondary | ICD-10-CM | POA: Diagnosis not present

## 2020-01-26 DIAGNOSIS — E039 Hypothyroidism, unspecified: Secondary | ICD-10-CM | POA: Diagnosis not present

## 2020-01-26 DIAGNOSIS — F329 Major depressive disorder, single episode, unspecified: Secondary | ICD-10-CM | POA: Insufficient documentation

## 2020-01-26 DIAGNOSIS — M8580 Other specified disorders of bone density and structure, unspecified site: Secondary | ICD-10-CM | POA: Insufficient documentation

## 2020-01-26 DIAGNOSIS — Z79899 Other long term (current) drug therapy: Secondary | ICD-10-CM | POA: Diagnosis not present

## 2020-01-26 DIAGNOSIS — C50412 Malignant neoplasm of upper-outer quadrant of left female breast: Secondary | ICD-10-CM | POA: Insufficient documentation

## 2020-01-26 DIAGNOSIS — F418 Other specified anxiety disorders: Secondary | ICD-10-CM | POA: Insufficient documentation

## 2020-01-26 DIAGNOSIS — I10 Essential (primary) hypertension: Secondary | ICD-10-CM | POA: Insufficient documentation

## 2020-01-26 DIAGNOSIS — Z800 Family history of malignant neoplasm of digestive organs: Secondary | ICD-10-CM | POA: Diagnosis not present

## 2020-01-26 NOTE — Progress Notes (Signed)
[place] Spiritual Care Note  Connected with Bethany [name] by phone per referral from Dr Jana Hakim. It sounds like Bethany [name] has complicated and deferred grief surrounding her dad's death, which immediately preceded her own diagnosis. She welcomes a referral to a community counselor to help with grief and health/cancer anxiety. I will phone her next week with referral suggestions. She gave permission to leave detailed voicemail if needed.   [place], [place], [facility] Pager [phone] Voicemail [phone]

## 2020-01-29 ENCOUNTER — Telehealth: Payer: Self-pay | Admitting: *Deleted

## 2020-01-29 ENCOUNTER — Telehealth: Payer: Self-pay | Admitting: Oncology

## 2020-01-29 NOTE — Telephone Encounter (Signed)
Attempt x1 to contact pt regarding recent bone density results.  Per Wilber Bihari, NP pt needing to start taking Vitamin D, Calcium, and preform weight bearing exercises.  No answer, LVM to return call to the office.

## 2020-02-01 ENCOUNTER — Encounter: Payer: Self-pay | Admitting: General Practice

## 2020-02-01 NOTE — Progress Notes (Signed)
[place] Note  Spoke with Jeannie Done by phone for follow-up regarding counseling referral. She is open to [facility], to which her medical oncologist can make a referral, and prefers to work with a female provider. Will inbasket Dr Jana Hakim with this referral request.   Pauline Good, [facility] Pager [phone] Voicemail [phone]

## 2020-02-02 ENCOUNTER — Other Ambulatory Visit: Payer: Self-pay | Admitting: Oncology

## 2020-02-02 DIAGNOSIS — C50412 Malignant neoplasm of upper-outer quadrant of left female breast: Secondary | ICD-10-CM

## 2020-02-02 DIAGNOSIS — Z170 Estrogen receptor positive status [ER+]: Secondary | ICD-10-CM

## 2020-02-02 NOTE — Telephone Encounter (Signed)
No entry 

## 2020-02-06 ENCOUNTER — Other Ambulatory Visit: Payer: Self-pay | Admitting: Oncology

## 2020-02-06 ENCOUNTER — Ambulatory Visit (INDEPENDENT_AMBULATORY_CARE_PROVIDER_SITE_OTHER): Payer: 59 | Admitting: Bariatrics

## 2020-02-06 ENCOUNTER — Encounter (INDEPENDENT_AMBULATORY_CARE_PROVIDER_SITE_OTHER): Payer: Self-pay | Admitting: Bariatrics

## 2020-02-06 ENCOUNTER — Other Ambulatory Visit: Payer: Self-pay

## 2020-02-06 VITALS — BP 132/86 | HR 79 | Temp 98.7°F | Ht 66.0 in | Wt 196.0 lb

## 2020-02-06 DIAGNOSIS — Z9189 Other specified personal risk factors, not elsewhere classified: Secondary | ICD-10-CM

## 2020-02-06 DIAGNOSIS — E559 Vitamin D deficiency, unspecified: Secondary | ICD-10-CM | POA: Diagnosis not present

## 2020-02-06 DIAGNOSIS — E038 Other specified hypothyroidism: Secondary | ICD-10-CM | POA: Diagnosis not present

## 2020-02-06 DIAGNOSIS — E669 Obesity, unspecified: Secondary | ICD-10-CM | POA: Diagnosis not present

## 2020-02-06 DIAGNOSIS — Z6831 Body mass index (BMI) 31.0-31.9, adult: Secondary | ICD-10-CM

## 2020-02-06 MED ORDER — VITAMIN D (ERGOCALCIFEROL) 1.25 MG (50000 UNIT) PO CAPS: 50000.0000 [IU] | ORAL_CAPSULE | ORAL | 0 refills | Status: DC

## 2020-02-06 MED ORDER — PHENTERMINE-TOPIRAMATE ER 7.5-46 MG PO CP24: ORAL_CAPSULE | ORAL | 0 refills | Status: DC

## 2020-02-07 NOTE — Progress Notes (Signed)
Chief Complaint:   OBESITY Bethany [name] is here to discuss her progress with her obesity treatment plan along with follow-up of her obesity related diagnoses. Bethany [name] is following a lower carbohydrate, vegetable and lean protein rich diet plan and states she is following her eating plan approximately 20% of the time. Bethany [name] states she is exercising 0 minutes 0 times per week.  Today's visit was #: 5 Starting weight: 192 lbs Starting date: 10/16/2019 Today's weight: 196 lbs Today's date: 02/06/2020 Total lbs lost to date: 0 Total lbs lost since last in-office visit: 0  Interim History: Bethany [name] is up 3 lbs since her last visit on 12/05/2019. She has started on Qsymia at her visit.  Subjective:   Vitamin D deficiency. Bethany [name] is taking Vitamin D and denies side effects.   Ref. Range 10/16/2019 12:46  Vitamin D, 25-Hydroxy Latest Ref Range: 30.0 - 100.0 ng/mL 28.1 (L)   Other specified hypothyroidism. Bethany [name] is taking Synthroid. She denies sensitivity to hot or cold.  Lab Results  Component Value Date   TSH 1.020 10/16/2019   At risk for osteoporosis. Bethany [name] is at higher risk of osteopenia and osteoporosis due to Vitamin D deficiency.   Assessment/Plan:   Vitamin D deficiency. Low Vitamin D level contributes to fatigue and are associated with obesity, breast, and colon cancer. She was given a prescription for Vitamin D, Ergocalciferol, (DRISDOL) 1.25 MG (50000 UNIT) CAPS capsule every week #4 with 0 refills and will follow-up for routine testing of Vitamin D, at least 2-3 times per year to avoid over-replacement.  Other specified hypothyroidism. Patient with long-standing hypothyroidism, on levothyroxine therapy. She appears euthyroid. Orders and follow up as documented in patient record. Bethany [name] will continue her medication as directed.   Counseling . Good thyroid control is important for overall health. Supratherapeutic thyroid levels are dangerous and will not improve weight  loss results. . The correct way to take levothyroxine is fasting, with water, separated by at least 30 minutes from breakfast, and separated by more than 4 hours from calcium, iron, multivitamins, acid reflux medications (PPIs).   At risk for osteoporosis. Bethany [name] was given approximately 15 minutes of osteoporosis prevention counseling today. Bethany [name] is at risk for osteopenia and osteoporosis due to her Vitamin D deficiency. She was encouraged to take her Vitamin D and follow her higher calcium diet and increase strengthening exercise to help strengthen her bones and decrease her risk of osteopenia and osteoporosis.  Repetitive spaced learning was employed today to elicit superior memory formation and behavioral change.  Class 1 obesity with serious comorbidity and body mass index (BMI) of 31.0 to 31.9 in adult, unspecified obesity type. Prescription was given for Phentermine-Topiramate 7.5-46 MG CP24 1 daily #30 with 0 refills.  Bethany [name] is currently in the action stage of change. As such, her goal is to continue with weight loss efforts. She has agreed to the Category 2 Plan.   She will work on meal planning and mindful eating.   Exercise goals: All adults should avoid inactivity. Some physical activity is better than none, and adults who participate in any amount of physical activity gain some health benefits.  Behavioral modification strategies: increasing lean protein intake, decreasing simple carbohydrates, increasing vegetables, increasing water intake, decreasing eating out, no skipping meals, meal planning and cooking strategies, keeping healthy foods in the home and planning for success.  Bethany [name] has agreed to follow-up with our clinic fasting in 3 weeks. She was informed of the importance of frequent follow-up  visits to maximize her success with intensive lifestyle modifications for her multiple health conditions.   Objective:   Blood pressure 132/86, pulse 79, temperature 98.7 F (37.1  C), height 5\' 6"  (1.676 m), weight 196 lb (88.9 kg), SpO2 94 %. Body mass index is 31.64 kg/m.  General: Cooperative, alert, well developed, in no acute distress. HEENT: Conjunctivae and lids unremarkable. Cardiovascular: Regular rhythm.  Lungs: Normal work of breathing. Neurologic: No focal deficits.   Lab Results  Component Value Date   CREATININE 0.86 10/12/2019   BUN 17 10/12/2019   NA 140 10/12/2019   K 4.2 10/12/2019   CL 105 10/12/2019   CO2 27 10/12/2019   Lab Results  Component Value Date   ALT 27 10/12/2019   AST 20 10/12/2019   ALKPHOS 78 10/12/2019   BILITOT 0.4 10/12/2019   Lab Results  Component Value Date   HGBA1C 5.7 (H) 10/16/2019   HGBA1C 5.7 (H) 06/19/2014   Lab Results  Component Value Date   INSULIN 11.1 10/16/2019   Lab Results  Component Value Date   TSH 1.020 10/16/2019   Lab Results  Component Value Date   CHOL 191 10/16/2019   HDL 73 10/16/2019   LDLCALC 99 10/16/2019   TRIG 107 10/16/2019   CHOLHDL 2.3 09/19/2019   Lab Results  Component Value Date   WBC 5.5 10/12/2019   HGB 12.2 10/12/2019   HCT 38.0 10/12/2019   MCV 93.4 10/12/2019   PLT 262 10/12/2019   No results found for: IRON, TIBC, FERRITIN  Attestation Statements:   Reviewed by clinician on day of visit: allergies, medications, problem list, medical history, surgical history, family history, social history, and previous encounter notes.  Migdalia Dk, am acting as Location manager for [employer], DO   I have reviewed the above documentation for accuracy and completeness, and I agree with the above. Jearld Lesch, DO

## 2020-02-12 ENCOUNTER — Encounter (INDEPENDENT_AMBULATORY_CARE_PROVIDER_SITE_OTHER): Payer: Self-pay | Admitting: Bariatrics

## 2020-02-15 ENCOUNTER — Other Ambulatory Visit: Payer: Self-pay

## 2020-02-15 ENCOUNTER — Ambulatory Visit: Payer: 59 | Admitting: Oncology

## 2020-02-15 ENCOUNTER — Inpatient Hospital Stay: Payer: 59 | Attending: Oncology

## 2020-02-15 DIAGNOSIS — Z170 Estrogen receptor positive status [ER+]: Secondary | ICD-10-CM | POA: Diagnosis not present

## 2020-02-15 DIAGNOSIS — C50412 Malignant neoplasm of upper-outer quadrant of left female breast: Secondary | ICD-10-CM | POA: Diagnosis not present

## 2020-02-15 DIAGNOSIS — E559 Vitamin D deficiency, unspecified: Secondary | ICD-10-CM | POA: Diagnosis not present

## 2020-02-15 LAB — CBC WITH DIFFERENTIAL/PLATELET
Abs Immature Granulocytes: 0.02 10*3/uL (ref 0.00–0.07)
Basophils Absolute: 0 10*3/uL (ref 0.0–0.1)
Basophils Relative: 1 %
Eosinophils Absolute: 0.2 10*3/uL (ref 0.0–0.5)
Eosinophils Relative: 3 %
HCT: 37.1 % (ref 36.0–46.0)
Hemoglobin: 12 g/dL (ref 12.0–15.0)
Immature Granulocytes: 0 %
Lymphocytes Relative: 30 %
Lymphs Abs: 1.5 10*3/uL (ref 0.7–4.0)
MCH: 29.4 pg (ref 26.0–34.0)
MCHC: 32.3 g/dL (ref 30.0–36.0)
MCV: 90.9 fL (ref 80.0–100.0)
Monocytes Absolute: 0.4 10*3/uL (ref 0.1–1.0)
Monocytes Relative: 8 %
Neutro Abs: 2.8 10*3/uL (ref 1.7–7.7)
Neutrophils Relative %: 58 %
Platelets: 265 10*3/uL (ref 150–400)
RBC: 4.08 MIL/uL (ref 3.87–5.11)
RDW: 12.9 % (ref 11.5–15.5)
WBC: 4.9 10*3/uL (ref 4.0–10.5)
nRBC: 0 % (ref 0.0–0.2)

## 2020-02-15 LAB — COMPREHENSIVE METABOLIC PANEL
ALT: 26 U/L (ref 0–44)
AST: 18 U/L (ref 15–41)
Albumin: 3.6 g/dL (ref 3.5–5.0)
Alkaline Phosphatase: 77 U/L (ref 38–126)
Anion gap: 7 (ref 5–15)
BUN: 23 mg/dL — ABNORMAL HIGH (ref 6–20)
CO2: 26 mmol/L (ref 22–32)
Calcium: 8.6 mg/dL — ABNORMAL LOW (ref 8.9–10.3)
Chloride: 108 mmol/L (ref 98–111)
Creatinine, Ser: 0.81 mg/dL (ref 0.44–1.00)
GFR, Estimated: >60 mL/min (ref >60)
Glucose, Bld: 95 mg/dL (ref 70–99)
Potassium: 4.4 mmol/L (ref 3.5–5.1)
Sodium: 141 mmol/L (ref 135–145)
Total Bilirubin: 0.4 mg/dL (ref 0.3–1.2)
Total Protein: 6.5 g/dL (ref 6.5–8.1)

## 2020-02-16 ENCOUNTER — Encounter: Payer: Self-pay | Admitting: General Practice

## 2020-02-16 NOTE — Progress Notes (Signed)
[place] Spiritual Care Note  Phoned Jeannie Done per referral from Dr Jana Hakim to close the communication loop, ensuring that she had heard back from the behavioral health office regarding her requested referral. Apparently, Jeannie Done and the office have had some "phone tag," so Jeannie Done planned to phone the office again after our call. Jeannie Done is also aware of ongoing availability chaplain support.   [place], [place], [facility] Pager [phone] Voicemail [phone]

## 2020-02-20 ENCOUNTER — Ambulatory Visit (INDEPENDENT_AMBULATORY_CARE_PROVIDER_SITE_OTHER): Payer: 59

## 2020-02-20 ENCOUNTER — Ambulatory Visit
Admission: RE | Admit: 2020-02-20 | Discharge: 2020-02-20 | Disposition: A | Discharge status: Home / Self Care | Payer: 59 | Source: Ambulatory Visit | Attending: Internal Medicine | Admitting: Internal Medicine

## 2020-02-20 ENCOUNTER — Other Ambulatory Visit: Payer: Self-pay

## 2020-02-20 VITALS — BP 134/85 | HR 75 | Temp 98.2°F | Resp 20

## 2020-02-20 DIAGNOSIS — S80812A Abrasion, left lower leg, initial encounter: Secondary | ICD-10-CM | POA: Diagnosis not present

## 2020-02-20 DIAGNOSIS — S20219A Contusion of unspecified front wall of thorax, initial encounter: Secondary | ICD-10-CM

## 2020-02-20 DIAGNOSIS — R079 Chest pain, unspecified: Secondary | ICD-10-CM | POA: Diagnosis not present

## 2020-02-20 DIAGNOSIS — W19XXXA Unspecified fall, initial encounter: Secondary | ICD-10-CM

## 2020-02-20 IMAGING — DX DG CHEST 2V
2 series · 2 of 2 positions shown · non-contrast
Comparison: [DATE].

CLINICAL DATA: Chest pain after fall.

EXAM:
CHEST - 2 VIEW

[chest pa]
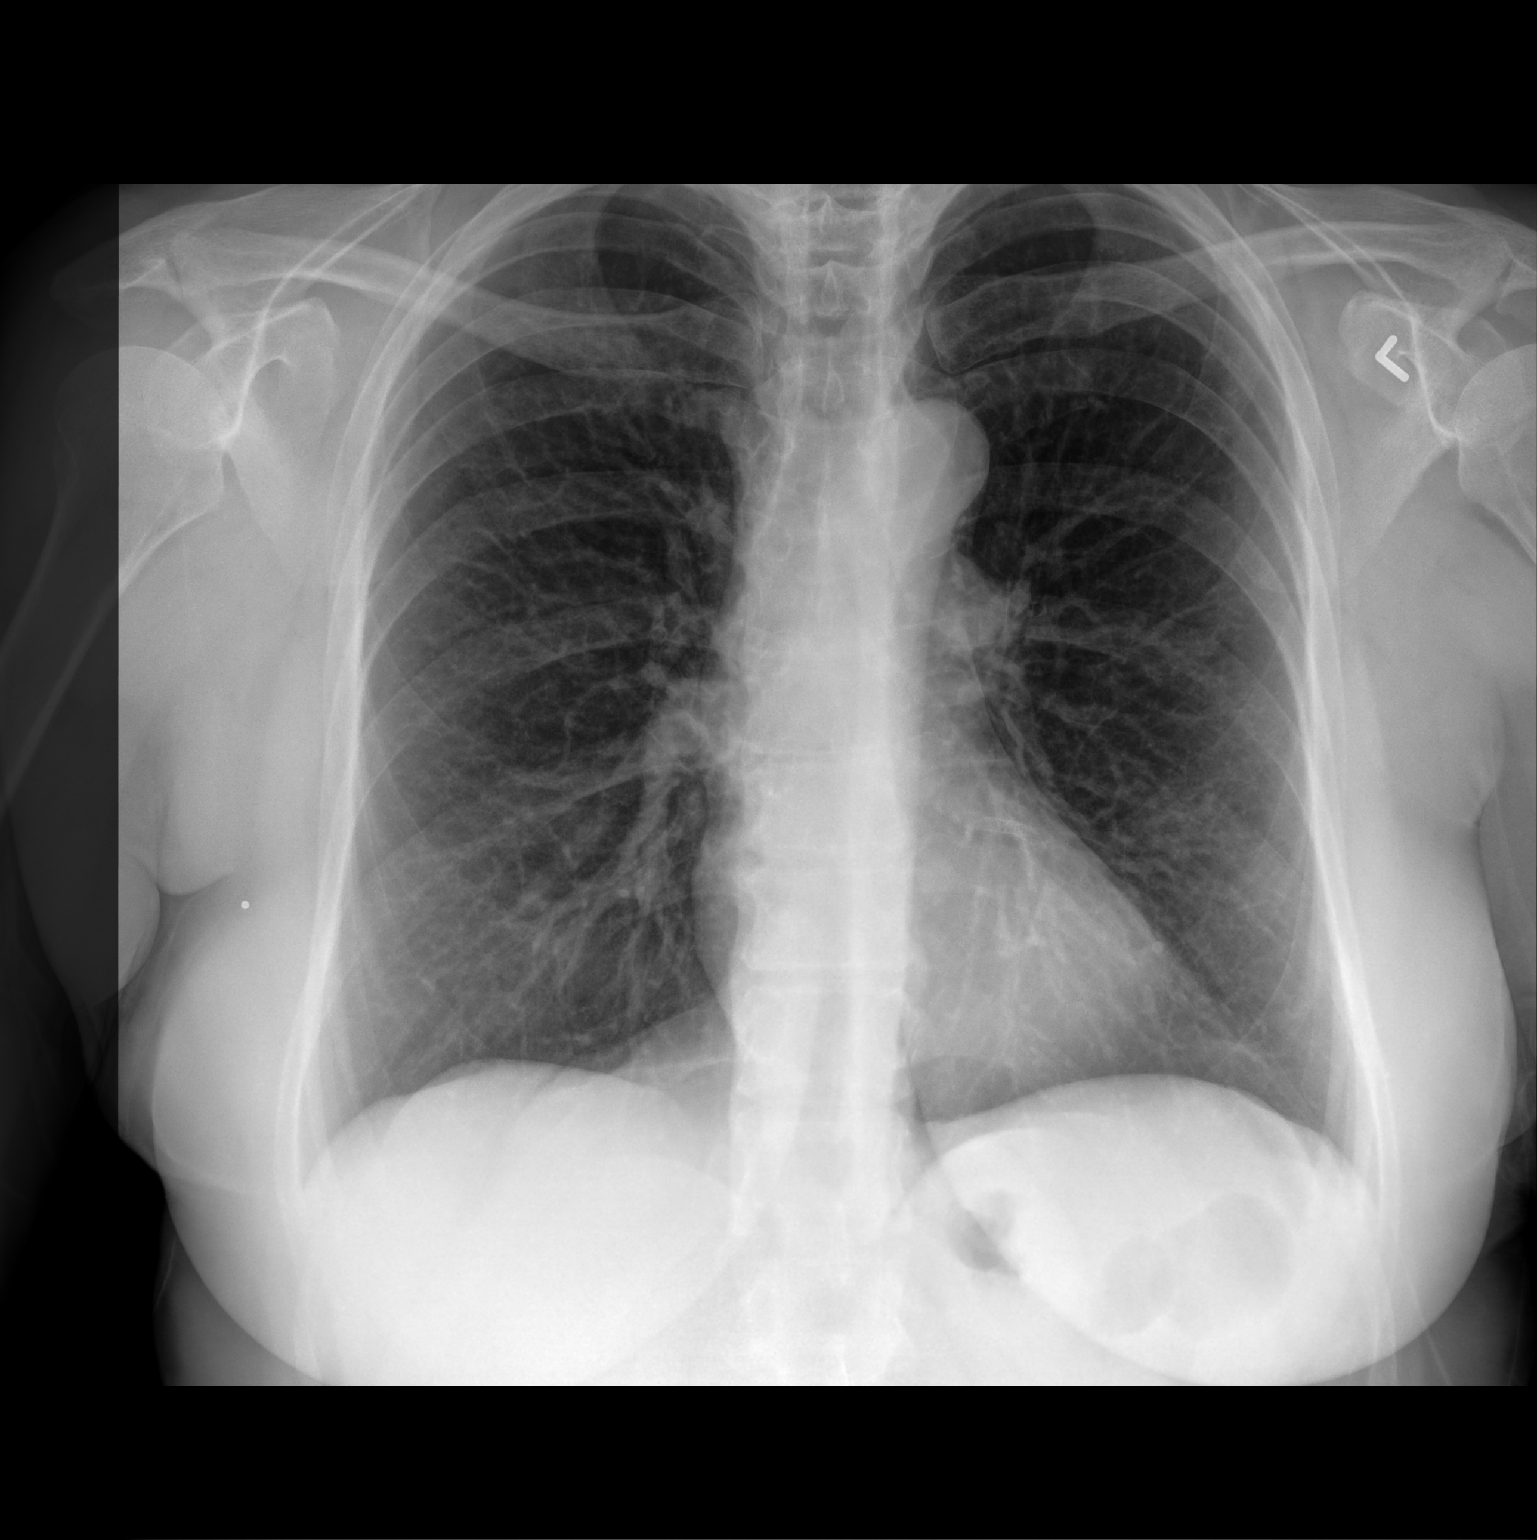

[chest lat]
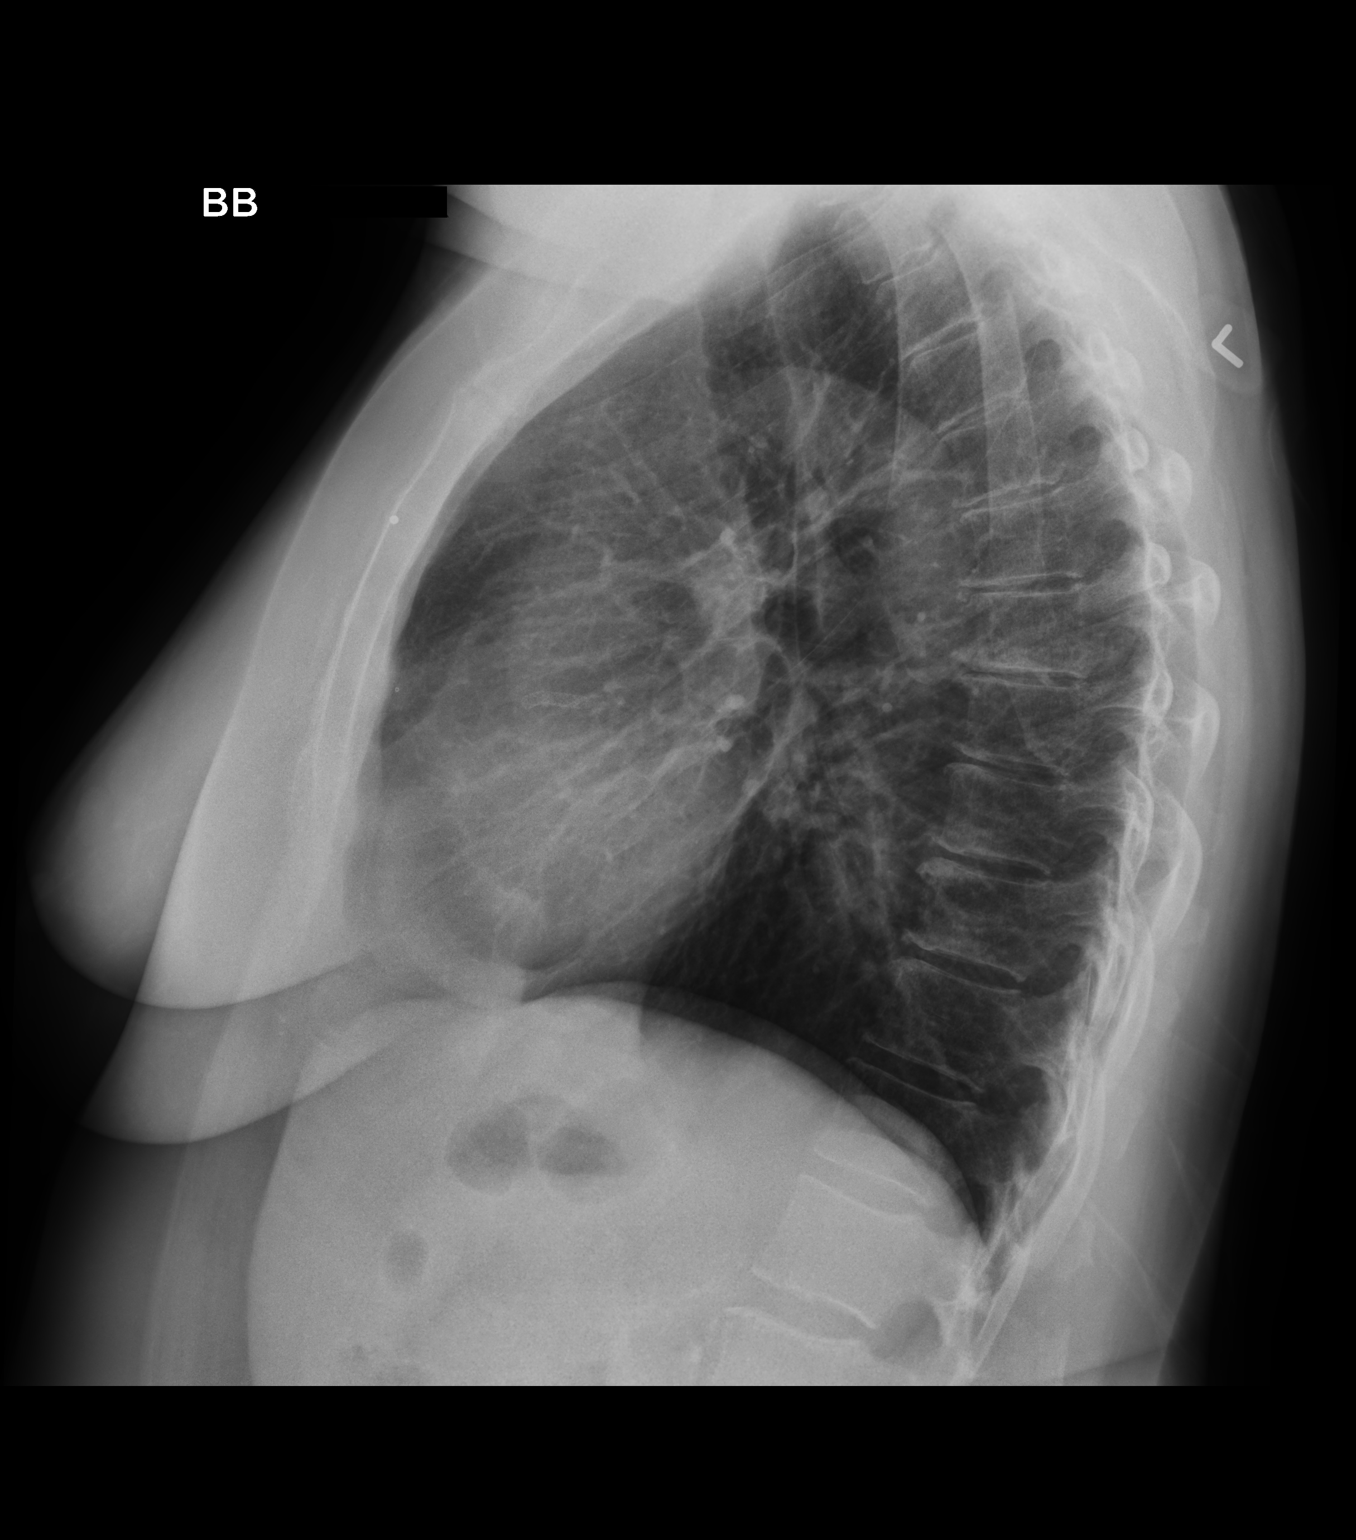

[2 of 2 positions shown; findings below may reference images not displayed]

FINDINGS: The heart size and mediastinal contours are within normal limits.
Both lungs are clear. No pneumothorax or pleural effusion is noted.
The visualized skeletal structures are unremarkable.
IMPRESSION: No active cardiopulmonary disease.

## 2020-02-20 MED ORDER — KETOROLAC TROMETHAMINE 30 MG/ML IJ SOLN: 30.0000 mg | Freq: Once | INTRAMUSCULAR | Status: DC

## 2020-02-20 MED ORDER — KETOROLAC TROMETHAMINE 30 MG/ML IJ SOLN
30.0000 mg | Freq: Once | INTRAMUSCULAR | Status: AC
  Administered 2020-02-20: 30 mg via INTRAMUSCULAR

## 2020-02-20 MED ORDER — TETANUS-DIPHTH-ACELL PERTUSSIS 5-2.5-18.5 LF-MCG/0.5 IM SUSY
0.5000 mL | PREFILLED_SYRINGE | Freq: Once | INTRAMUSCULAR | Status: AC
  Administered 2020-02-20: 0.5 mL via INTRAMUSCULAR

## 2020-02-20 MED ORDER — BACITRACIN ZINC 500 UNIT/GM EX OINT: 1.0000 "application " | TOPICAL_OINTMENT | Freq: Two times a day (BID) | CUTANEOUS | 0 refills | Status: DC

## 2020-02-20 MED ORDER — IBUPROFEN 600 MG PO TABS: 600.0000 mg | ORAL_TABLET | Freq: Four times a day (QID) | ORAL | 0 refills | Status: DC | PRN

## 2020-02-20 NOTE — ED Provider Notes (Signed)
[facility]    CSN: [id number] Arrival date & time: 02/20/20  1422      History   Chief Complaint Chief Complaint  Patient presents with  . Fall    HPI Bethany [name] is a 59 y.o. female comes to the urgent care with complaints of chest pain and left leg pain.  Patient fell yesterday around midnight when she was climbing into a shed.  Patient denies hitting her head.  She did not lose consciousness after falling.  She went back into the house and noticed severe sharp throbbing pain on the left leg.  She is able to bear weight.  She was able to stop the bleeding.  Patient also has sharp chest pain which is worse with taking a deep breath.  No known relieving factors.  Patient said that she "passed out" because of the severe pain.  She denies any blurry vision, nausea or vomiting.  Pain is currently a 6 out of 10.  Patient does not remember having a tetanus booster.   HPI  Past Medical History:  Diagnosis Date  . ADD (attention deficit disorder)   . Anxiety   . B12 deficiency   . Breast cancer ([place]) 2019   left breast cancer/diagnosed in 10/2017/taking radiation treatment until 03/15/18  . Breast mass, left 11/2017   going thru radiation until 02/2018  . CAD (coronary artery disease)    a. DES to LAD 06/2019.  Marland Kitchen Chest pain 06/19/2014  . COPD (chronic obstructive pulmonary disease) ([place])    beginning stages/small scar  . Dental crowns present   . Depression   . Hiatal hernia   . History of hiatal hernia    no current med.  Marland Kitchen History of thyroid cancer 12/15/2017  . Hypertension    states under control with med., has been on med. x 1 yr.  . Hypothyroidism   . Malignant neoplasm of upper-outer quadrant of left breast in female, estrogen receptor positive ([place])   . Mild hyperlipidemia   . 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 MVP (mitral valve prolapse)   . Personal history of radiation therapy 2019   Left Breast Cancer  . PONV (postoperative nausea and vomiting)   .  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 Syncope and collapse 12/25/2008   Qualifier: Diagnosis of  By: Philemon Kingdom    . Urinary incontinence    USI   . Vitamin D deficiency     Patient Active Problem List   Diagnosis Date Noted  . Primary osteoarthritis of first carpometacarpal joint of right hand 10/24/2019  . Primary osteoarthritis of first carpometacarpal joint of left hand 10/24/2019  . Prediabetes 10/17/2019  . Class 1 obesity due to excess calories with body mass index (BMI) of 30.0 to 30.9 in adult 10/16/2019  . Hypertension 06/30/2019  . Hyperlipidemia 06/30/2019  . Unstable angina ([place])   . History of thyroid cancer 12/15/2017  . 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 Ischemic chest pain ([place])   . Chest pain 06/19/2014  . Malignant neoplasm of upper-outer quadrant of left breast in female, estrogen receptor positive ([place])   . Vitamin D deficiency   . SYNCOPE AND COLLAPSE 12/25/2008    Past Surgical History:  Procedure Laterality Date  . ABDOMINAL HYSTERECTOMY     partial  . APPENDECTOMY    . AXILLARY SENTINEL NODE BIOPSY Left 12/09/2017   Procedure: AXILLARY SENTINEL NODE BIOPSY;  Surgeon: Erroll Luna, MD;  Location: [place];  Service: General;  Laterality: Left;  . BREAST EXCISIONAL BIOPSY Right 09/20/2019  . BREAST LUMPECTOMY Left 11/2017  . BREAST LUMPECTOMY WITH NEEDLE LOCALIZATION Left 11/25/2017   Procedure: LEFT BREAST LUMPECTOMY WITH NEEDLE LOCALIZATION;  Surgeon: Erroll Luna, MD;  Location: [place];  Service: General;  Laterality: Left;  . BREAST LUMPECTOMY WITH RADIOACTIVE SEED LOCALIZATION Right 09/20/2019   Procedure: RIGHT BREAST LUMPECTOMY WITH RADIOACTIVE SEED LOCALIZATION;  Surgeon: Erroll Luna, MD;  Location: [place];  Service: General;  Laterality: Right;  . COLONOSCOPY WITH PROPOFOL  10/03/2015  . CORONARY STENT INTERVENTION N/A 06/29/2019   Procedure: CORONARY STENT INTERVENTION;   Surgeon: Burnell Blanks, MD;  Location: [place] [facility];  Service: Cardiovascular;  Laterality: N/A;  . LAPAROSCOPIC APPENDECTOMY N/A 10/03/2015   Procedure: APPENDECTOMY LAPAROSCOPIC;  Surgeon: Rolm Bookbinder, MD;  Location: [place];  Service: General;  Laterality: N/A;  . LEFT HEART CATH AND CORONARY ANGIOGRAPHY N/A 06/29/2019   Procedure: LEFT HEART CATH AND CORONARY ANGIOGRAPHY;  Surgeon: Burnell Blanks, MD;  Location: [place] [facility];  Service: Cardiovascular;  Laterality: N/A;  . TOTAL THYROIDECTOMY  04/02/2003    OB History    Gravida  3   Para  3   Term  3   Preterm      AB      Living  3     SAB      TAB      Ectopic      Multiple      Live Births               Home Medications    Prior to Admission medications   Medication Sig Start Date End Date Taking? Authorizing Provider  albuterol (VENTOLIN HFA) 108 (90 Base) MCG/ACT inhaler Inhale 2 puffs into the lungs every 6 (six) hours as needed for wheezing or shortness of breath. 11/24/18   Garner Nash, DO  aspirin EC 81 MG tablet Take 81 mg by mouth daily.    [provider]  bacitracin ointment Apply 1 application topically 2 (two) times daily. 02/20/20   LampteyMyrene Galas, MD  clopidogrel (PLAVIX) 75 MG tablet Take 1 tablet (75 mg total) by mouth daily. 10/30/19   Burnell Blanks, MD  ibuprofen (ADVIL) 600 MG tablet Take 1 tablet (600 mg total) by mouth every 6 (six) hours as needed. 02/20/20   Chase Picket, MD  isosorbide mononitrate (IMDUR) 30 MG 24 hr tablet Take 0.5 tablets (15 mg total) by mouth daily. 10/30/19 10/24/20  Burnell Blanks, MD  levothyroxine (SYNTHROID, LEVOTHROID) 137 MCG tablet Take 137 mcg by mouth at bedtime.     [provider]  losartan-hydrochlorothiazide (HYZAAR) 50-12.5 MG tablet Take 1 tablet by mouth daily. 01/24/20   Burnell Blanks, MD  Multiple Vitamin (MULTIVITAMIN WITH MINERALS) TABS tablet Take 1 tablet by  mouth daily.    [provider]  nitroGLYCERIN (NITROSTAT) 0.4 MG SL tablet Place 1 tablet (0.4 mg total) under the tongue every 5 (five) minutes as needed for chest pain. 06/22/19   Burnell Blanks, MD  Phentermine-Topiramate 7.5-46 MG CP24 1 capsule daily in the am 02/06/20   Jearld Lesch A, DO  rosuvastatin (CRESTOR) 40 MG tablet Take 1 tablet (40 mg total) by mouth daily. 09/21/19 12/20/19  Dunn, Nedra Hai, PA-C  venlafaxine XR (EFFEXOR-XR) 150 MG 24 hr capsule TAKE 1 CAPSULE (150 MG) BY MOUTH ONCE A DAY WITH BREAKFAST 07/04/19  Magrinat, Virgie Dad, MD  Vitamin D, Ergocalciferol, (DRISDOL) 1.25 MG (50000 UNIT) CAPS capsule Take 1 capsule (50,000 Units total) by mouth every 7 (seven) days. 02/06/20   Georgia Lopes, DO    Family History Family History  Problem Relation Age of Onset  . Hypertension Father   . Heart disease Father   . Non-Hodgkin's lymphoma Father   . Cancer Father   . Depression Father   . Anxiety disorder Father   . Alcoholism Father   . Hypertension Brother   . Heart disease Paternal Grandfather   . Colon cancer Other        great maternal aunt   . Colon polyps Mother   . High Cholesterol Mother   . Alcoholism Mother   . Stomach cancer Neg Hx   . Rectal cancer Neg Hx   . Esophageal cancer Neg Hx     Social History Social History   Tobacco Use  . Smoking status: Former Smoker    Quit date: 08/14/2014    Years since quitting: 5.5  . Smokeless tobacco: Never Used  Vaping Use  . Vaping Use: Never used  Substance Use Topics  . Alcohol use: Yes    Comment: rarely  . Drug use: No     Allergies   Oxycodone, Latex, Other, and Sulfa antibiotics   Review of Systems Review of Systems  Constitutional: Negative.   HENT: Negative.   Cardiovascular: Positive for chest pain. Negative for palpitations and leg swelling.  Gastrointestinal: Negative.   Musculoskeletal: Positive for arthralgias. Negative for myalgias.  Neurological: Negative.       Physical Exam Triage Vital Signs ED Triage Vitals  Enc Vitals Group     BP 02/20/20 1456 134/85     Pulse Rate 02/20/20 1456 75     Resp 02/20/20 1456 20     Temp 02/20/20 1456 98.2 F (36.8 C)     Temp src --      SpO2 02/20/20 1456 97 %     Weight --      Height --      Head Circumference --      Peak Flow --      Pain Score 02/20/20 1454 6     Pain Loc --      Pain Edu? --      Excl. in [place]? --    No data found.  Updated Vital Signs BP 134/85   Pulse 75   Temp 98.2 F (36.8 C)   Resp 20   SpO2 97%   Visual Acuity Right Eye Distance:   Left Eye Distance:   Bilateral Distance:    Right Eye Near:   Left Eye Near:    Bilateral Near:     Physical Exam Vitals and nursing note reviewed.  Constitutional:      General: She is in acute distress.     Appearance: She is not ill-appearing.  HENT:     Right Ear: Tympanic membrane normal.     Left Ear: Tympanic membrane normal.  Cardiovascular:     Rate and Rhythm: Normal rate and regular rhythm.     Pulses: Normal pulses.     Heart sounds: Normal heart sounds.  Pulmonary:     Effort: Pulmonary effort is normal. No respiratory distress.     Breath sounds: No stridor. No wheezing or rales.  Chest:     Chest wall: Tenderness present.  Abdominal:     General: Bowel sounds are normal. There is  no distension.     Palpations: Abdomen is soft.     Tenderness: There is no abdominal tenderness.  Musculoskeletal:        General: Normal range of motion.  Skin:    Comments: Tibial abrasion, superficial, nonbleeding, no erythema.  Neurological:     Mental Status: She is alert.      UC Treatments / Results  Labs (all labs ordered are listed, but only abnormal results are displayed) Labs Reviewed - No data to display  EKG   Radiology DG Chest 2 View  Result Date: 02/20/2020 CLINICAL DATA:  Chest pain after fall. EXAM: CHEST - 2 VIEW COMPARISON:  April 03, 2019. FINDINGS: The heart size and mediastinal  contours are within normal limits. Both lungs are clear. No pneumothorax or pleural effusion is noted. The visualized skeletal structures are unremarkable. IMPRESSION: No active cardiopulmonary disease. Electronically Signed   By: Marijo Conception M.D.   On: 02/20/2020 15:21    Procedures Procedures (including critical care time)  Medications Ordered in UC Medications  Tdap (BOOSTRIX) injection 0.5 mL (0.5 mLs Intramuscular Given 02/20/20 1625)  ketorolac (TORADOL) 30 MG/ML injection 30 mg (30 mg Intramuscular Given 02/20/20 1628)    Initial Impression / Assessment and Plan / UC Course  I have reviewed the triage vital signs and the nursing notes.  Pertinent labs & imaging results that were available during my care of the patient were reviewed by me and considered in my medical decision making (see chart for details).     1.  Contusion of the chest wall: Chest x-ray is negative for any lung contusions.  No rib fractures noted. Toradol 30 mg IM Ibuprofen 600 mg every 6 hours as needed for pain If patient experiences worsening shortness of breath, cough, fever, sputum production please return to the urgent care to be reevaluated  2.  Left leg contusion with tibia abrasion Tdap given Pain medications as above Daily wound dressing changes with bacitracin ointment If you notice any redness increased swelling, increased pain please return to the urgent care to be reevaluated. Final Clinical Impressions(s) / UC Diagnoses   Final diagnoses:  Contusion of chest wall, unspecified laterality, initial encounter  Abrasion of left leg, initial encounter     Discharge Instructions     Daily wound dressing changes If you notice any redness, purulent discharge, worsening swelling, fever or chills please return to urgent care to be reevaluated.   ED Prescriptions    Medication Sig Dispense Auth. Provider   ibuprofen (ADVIL) 600 MG tablet Take 1 tablet (600 mg total) by mouth every 6 (six)  hours as needed. 30 tablet Caitland Porchia, Myrene Galas, MD   bacitracin ointment Apply 1 application topically 2 (two) times daily. 120 g Waldron Gerry, Myrene Galas, MD     PDMP not reviewed this encounter.   Chase Picket, MD 02/20/20 [phone]

## 2020-02-20 NOTE — Discharge Instructions (Addendum)
Daily wound dressing changes If you notice any redness, purulent discharge, worsening swelling, fever or chills please return to urgent care to be reevaluated.

## 2020-02-20 NOTE — ED Triage Notes (Signed)
Pt states she fell into shed hitting her right chest and left leg, left leg has abrasion noted, pain is in right chest under breast , occurred last night

## 2020-02-28 ENCOUNTER — Ambulatory Visit (INDEPENDENT_AMBULATORY_CARE_PROVIDER_SITE_OTHER): Payer: 59 | Admitting: Bariatrics

## 2020-02-28 ENCOUNTER — Other Ambulatory Visit: Payer: Self-pay

## 2020-02-28 ENCOUNTER — Encounter (INDEPENDENT_AMBULATORY_CARE_PROVIDER_SITE_OTHER): Payer: Self-pay | Admitting: Bariatrics

## 2020-02-28 VITALS — BP 129/87 | HR 70 | Temp 98.3°F | Ht 66.0 in | Wt 198.0 lb

## 2020-02-28 DIAGNOSIS — Z9189 Other specified personal risk factors, not elsewhere classified: Secondary | ICD-10-CM

## 2020-02-28 DIAGNOSIS — E669 Obesity, unspecified: Secondary | ICD-10-CM

## 2020-02-28 DIAGNOSIS — E559 Vitamin D deficiency, unspecified: Secondary | ICD-10-CM

## 2020-02-28 DIAGNOSIS — R7303 Prediabetes: Secondary | ICD-10-CM

## 2020-02-28 DIAGNOSIS — Z6832 Body mass index (BMI) 32.0-32.9, adult: Secondary | ICD-10-CM

## 2020-02-28 MED ORDER — VITAMIN D (ERGOCALCIFEROL) 1.25 MG (50000 UNIT) PO CAPS: 50000.0000 [IU] | ORAL_CAPSULE | ORAL | 0 refills | Status: DC

## 2020-02-28 MED ORDER — PHENTERMINE-TOPIRAMATE ER 11.25-69 MG PO CP24: ORAL_CAPSULE | ORAL | 0 refills | Status: DC

## 2020-02-28 NOTE — Progress Notes (Signed)
Chief Complaint:   OBESITY Bethany [name] is here to discuss her progress with her obesity treatment plan along with follow-up of her obesity related diagnoses. Bethany [name] is on the Category 2 Plan and states she is following her eating plan approximately 0% of the time. Bethany [name] states she is walking 15-20 minutes 2 times per week.  Today's visit was #: 6 Starting weight: 192 lbs Starting date: 10/16/2019 Today's weight: 198 lbs Today's date: 02/28/2020 Total lbs lost to date: 0 Total lbs lost since last in-office visit: 0  Interim History: Bethany [name]'s weight has gone up 2 lbs since her last visit as she has had sore lips and unable to eat adequately on her plan.  Subjective:   Prediabetes. [name] has a diagnosis of prediabetes based on her elevated HgA1c and was informed this puts her at greater risk of developing diabetes. She continues to work on diet and exercise to decrease her risk of diabetes. She denies nausea or hypoglycemia. [name] is on no medication.  Lab Results  Component Value Date   HGBA1C 5.7 (H) 10/16/2019   Lab Results  Component Value Date   INSULIN 11.1 10/16/2019   Vitamin D deficiency. Bethany [name] is taking Vitamin D supplementation.    Ref. Range 10/16/2019 12:46  Vitamin D, 25-Hydroxy Latest Ref Range: 30.0 - 100.0 ng/mL 28.1 (L)   At risk for osteoporosis. Bethany [name] is at higher risk of osteopenia and osteoporosis due to Vitamin D deficiency.   Assessment/Plan:   Prediabetes. Bethany [name] will continue to work on weight loss, exercise, and decreasing simple carbohydrates to help decrease the risk of diabetes. Hemoglobin A1c, Insulin, random levels will be checked today.  Vitamin D deficiency. Low Vitamin D level contributes to fatigue and are associated with obesity, breast, and colon cancer. She was given a prescription for Vitamin D, Ergocalciferol, (DRISDOL) 1.25 MG (50000 UNIT) CAPS capsule every week #4 with 0 refills and VITAMIN D 25 Hydroxy (Vit-D Deficiency,  Fractures) level will be checked today.   At risk for osteoporosis. Bethany [name] was given approximately 15 minutes of osteoporosis prevention counseling today. Bethany [name] is at risk for osteopenia and osteoporosis due to her Vitamin D deficiency. She was encouraged to take her Vitamin D and follow her higher calcium diet and increase strengthening exercise to help strengthen her bones and decrease her risk of osteopenia and osteoporosis.  Repetitive spaced learning was employed today to elicit superior memory formation and behavioral change.  Class 1 obesity with serious comorbidity and body mass index (BMI) of 32.0 to 32.9 in adult, unspecified obesity type. Prescription was given for Qsymia 11.25-69 1 daily #30 with 0 refills.  Wilene is currently in the action stage of change. As such, her goal is to continue with weight loss efforts. She has agreed to the Category 2 Plan.   She will work on meal planning, intentional eating, and being more adherent to the plan.  Exercise goals: All adults should avoid inactivity. Some physical activity is better than none, and adults who participate in any amount of physical activity gain some health benefits.  Behavioral modification strategies: increasing lean protein intake, decreasing simple carbohydrates, increasing vegetables, increasing water intake, decreasing eating out, no skipping meals, meal planning and cooking strategies and keeping healthy foods in the home.  Bethany [name] has agreed to follow-up with our clinic in 3 weeks. She was informed of the importance of frequent follow-up visits to maximize her success with intensive lifestyle modifications for her multiple health conditions.   Bethany [name]  was informed we would discuss her lab results at her next visit unless there is a critical issue that needs to be addressed sooner. Bethany [name] agreed to keep her next visit at the agreed upon time to discuss these results.  Objective:   Blood pressure 129/87, pulse 70,  temperature 98.3 F (36.8 C), height 5\' 6"  (1.676 m), weight 198 lb (89.8 kg), SpO2 98 %. Body mass index is 31.96 kg/m.  General: Cooperative, alert, well developed, in no acute distress. HEENT: Conjunctivae and lids unremarkable. Cardiovascular: Regular rhythm.  Lungs: Normal work of breathing. Neurologic: No focal deficits.   Lab Results  Component Value Date   CREATININE 0.81 02/15/2020   BUN 23 (H) 02/15/2020   NA 141 02/15/2020   K 4.4 02/15/2020   CL 108 02/15/2020   CO2 26 02/15/2020   Lab Results  Component Value Date   ALT 26 02/15/2020   AST 18 02/15/2020   ALKPHOS 77 02/15/2020   BILITOT 0.4 02/15/2020   Lab Results  Component Value Date   HGBA1C 5.7 (H) 10/16/2019   HGBA1C 5.7 (H) 06/19/2014   Lab Results  Component Value Date   INSULIN 11.1 10/16/2019   Lab Results  Component Value Date   TSH 1.020 10/16/2019   Lab Results  Component Value Date   CHOL 191 10/16/2019   HDL 73 10/16/2019   LDLCALC 99 10/16/2019   TRIG 107 10/16/2019   CHOLHDL 2.3 09/19/2019   Lab Results  Component Value Date   WBC 4.9 02/15/2020   HGB 12.0 02/15/2020   HCT 37.1 02/15/2020   MCV 90.9 02/15/2020   PLT 265 02/15/2020   No results found for: IRON, TIBC, FERRITIN  Attestation Statements:   Reviewed by clinician on day of visit: allergies, medications, problem list, medical history, surgical history, family history, social history, and previous encounter notes.  Migdalia Dk, am acting as Location manager for [employer], DO   I have reviewed the above documentation for accuracy and completeness, and I agree with the above. Jearld Lesch, DO

## 2020-02-29 LAB — VITAMIN D 25 HYDROXY (VIT D DEFICIENCY, FRACTURES): Vit D, 25-Hydroxy: 32.7 ng/mL (ref 30.0–100.0)

## 2020-02-29 LAB — HEMOGLOBIN A1C
Est. average glucose Bld gHb Est-mCnc: 117 mg/dL
Hgb A1c MFr Bld: 5.7 % — ABNORMAL HIGH (ref 4.8–5.6)

## 2020-02-29 LAB — INSULIN, RANDOM: INSULIN: 10.5 u[IU]/mL (ref 2.6–24.9)

## 2020-03-04 ENCOUNTER — Encounter (INDEPENDENT_AMBULATORY_CARE_PROVIDER_SITE_OTHER): Payer: Self-pay | Admitting: Bariatrics

## 2020-03-16 HISTORY — PX: BREAST BIOPSY: SHX20

## 2020-03-18 ENCOUNTER — Encounter (INDEPENDENT_AMBULATORY_CARE_PROVIDER_SITE_OTHER): Payer: Self-pay

## 2020-03-20 ENCOUNTER — Ambulatory Visit (INDEPENDENT_AMBULATORY_CARE_PROVIDER_SITE_OTHER): Payer: 59 | Admitting: Bariatrics

## 2020-05-02 ENCOUNTER — Ambulatory Visit: Payer: 59 | Admitting: Cardiovascular Disease

## 2020-05-28 ENCOUNTER — Ambulatory Visit
Admission: RE | Admit: 2020-05-28 | Discharge: 2020-05-28 | Disposition: A | Discharge status: Home / Self Care | Payer: 59 | Source: Ambulatory Visit | Attending: Oncology | Admitting: Oncology

## 2020-05-28 DIAGNOSIS — C50412 Malignant neoplasm of upper-outer quadrant of left female breast: Secondary | ICD-10-CM

## 2020-05-28 IMAGING — MR MR BREAST BILAT WO/W CM
7 of 9 series · 33 of 48 positions shown · IV contrast (gadavist)
Comparison: Previous exam(s). The patient's last bilateral
mammogram was a diagnostic mammogram dated [DATE].

CLINICAL DATA: Status post left lumpectomy for breast cancer in
[IK], followed by radiation therapy. Right breast benign excisional
biopsy on [DATE] for papilloma removal.

LABS:  None obtained on site today.
EXAM:
BILATERAL BREAST MRI WITH AND WITHOUT CONTRAST
TECHNIQUE: Multiplanar, multisequence MR images of both breasts were obtained
prior to and following the intravenous administration of 8 ml of
Gadavist

[Series 2: t2_tirm_tra ipat (a-p) · axial · 3.0mm · 0.78mm/px · 1 of 65 slices shown]
[im 1/65]
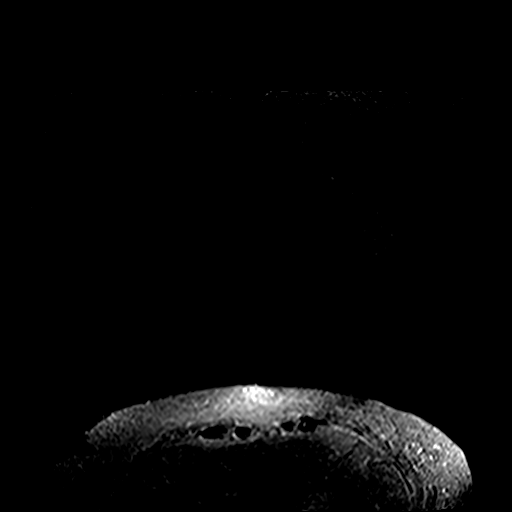

[Series 3: fl3d pre-cm no · axial · non-contrast · 1.2mm · 1.04mm/px · z∈[-101,+90]mm · 5 of 154 slices shown]
[im 1/154]
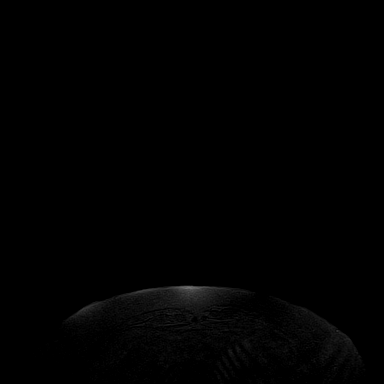
[im 39/154]
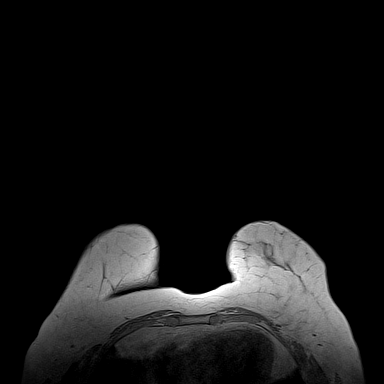
[im 77/154]
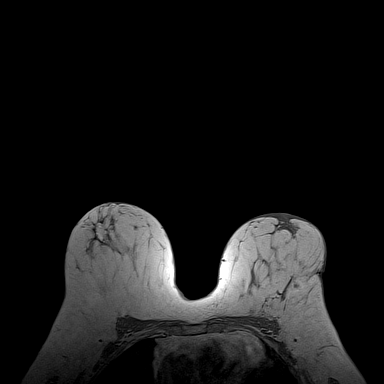
[im 115/154]
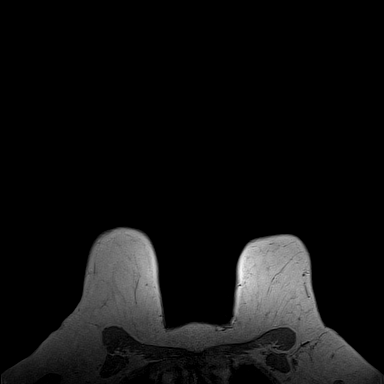
[im 154/154]
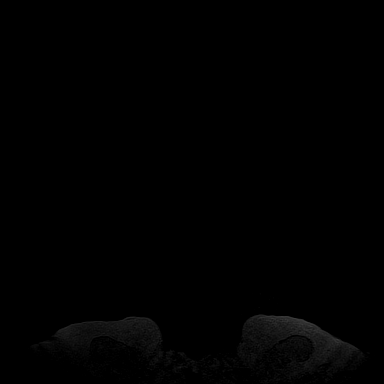

[Series 4: fl3d pre-cm · axial · non-contrast · 1.2mm · 1.04mm/px · z∈[-102,+89]mm · 6 of 160 slices shown]
[im 1/160]
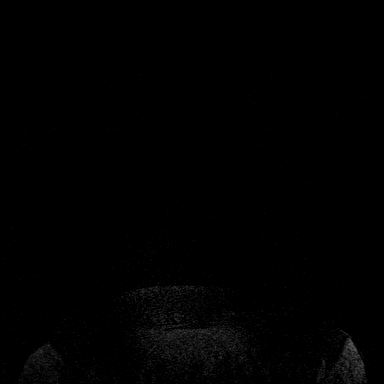
[im 32/160]
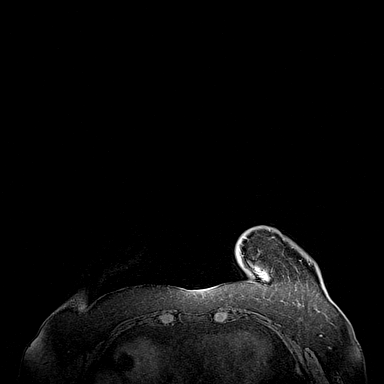
[im 64/160]
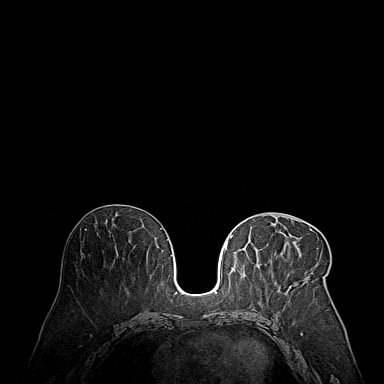
[im 96/160]
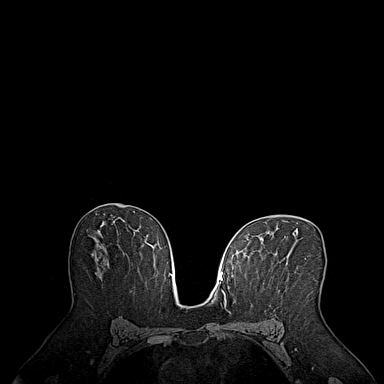
[im 128/160]
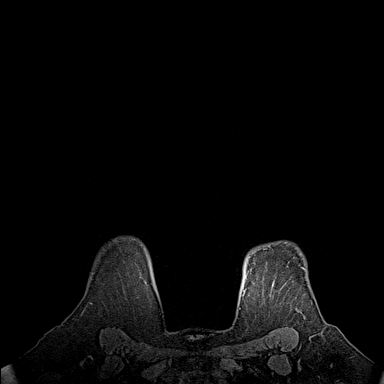
[im 160/160]
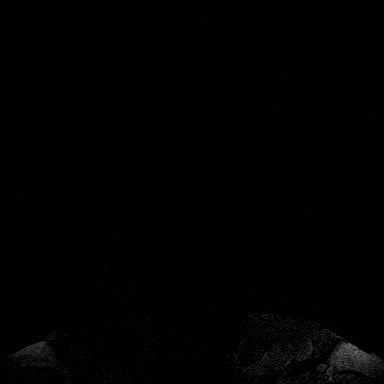

[Series 5: fl3d post-cm 20 · axial · 1.2mm · 1.04mm/px · z∈[-102,+89]mm · 6 of 160 slices shown (1 of 2)]
[im 1/160]
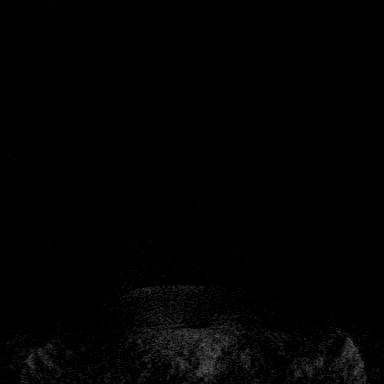
[im 32/160]
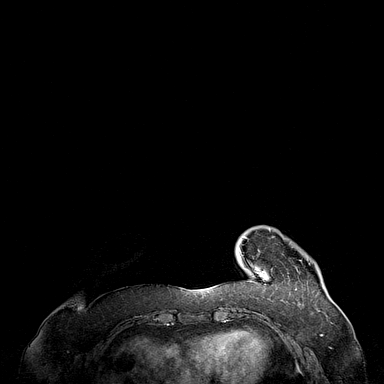
[im 64/160]
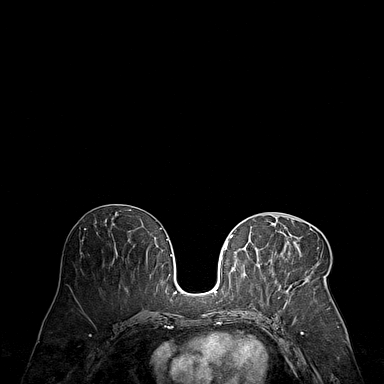
[im 96/160]
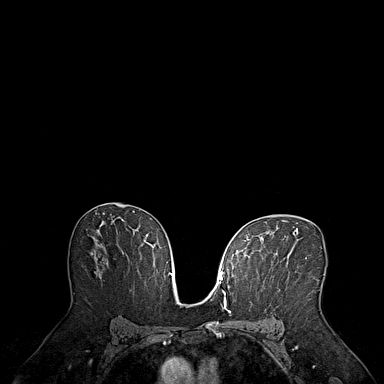
[im 128/160]
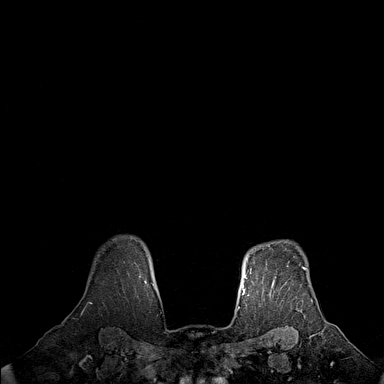
[im 160/160]
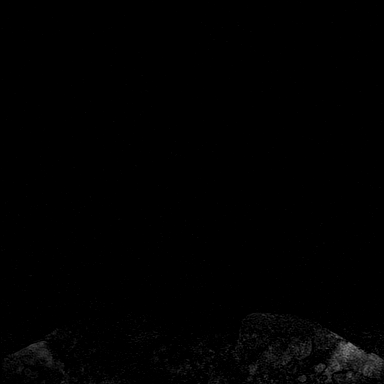

[Series 6: fl3d post-cm 20 · axial · 1.2mm · 1.04mm/px · z∈[-102,+89]mm · 6 of 160 slices shown (2 of 2)]
[im 1/160]
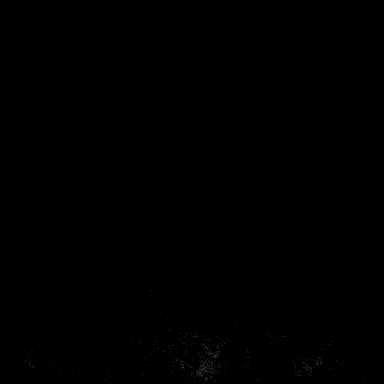
[im 32/160]
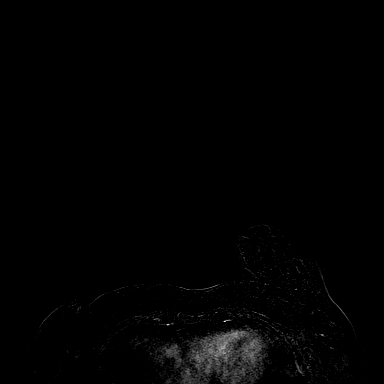
[im 64/160]
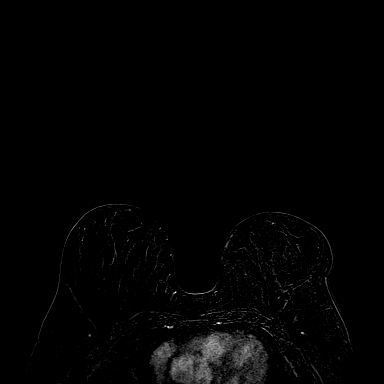
[im 96/160]
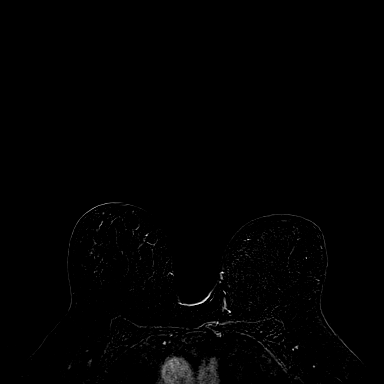
[im 128/160]
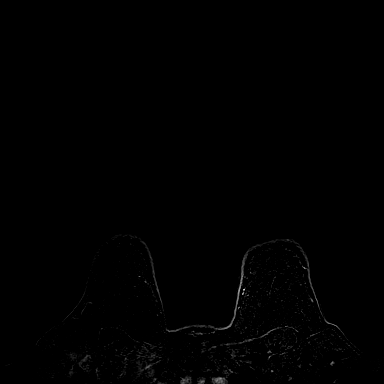
[im 160/160]
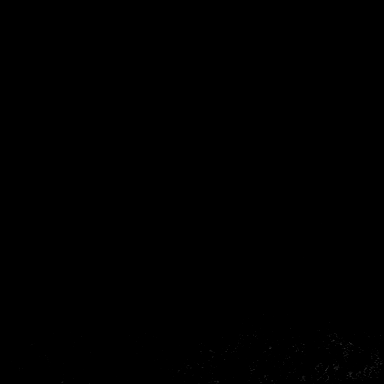

[Series 8: fl3d post-cm 3min · axial · 1.2mm · 1.04mm/px · z∈[-102,+89]mm · 6 of 160 slices shown]
[im 1/160]
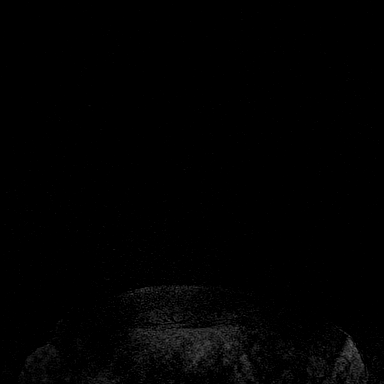
[im 32/160]
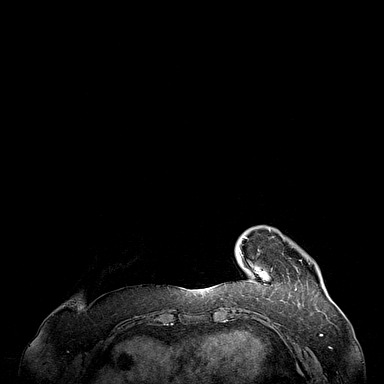
[im 64/160]
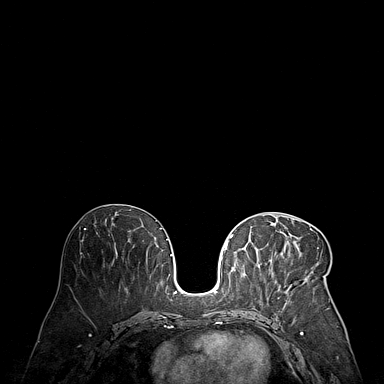
[im 96/160]
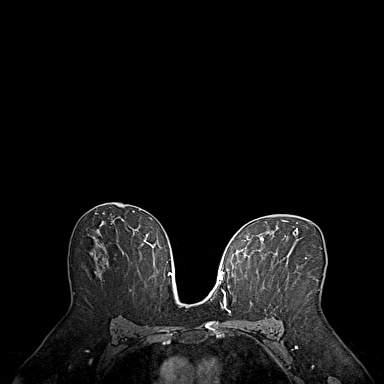
[im 128/160]
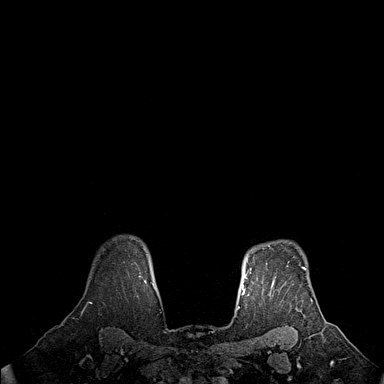
[im 160/160]
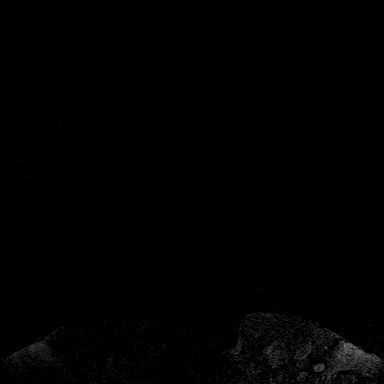

[Series 9: fl3d post-cm 3min_sub · axial · 1.2mm · 1.04mm/px · z∈[-102,-26]mm · 3 of 160 slices shown]
[im 1/160]
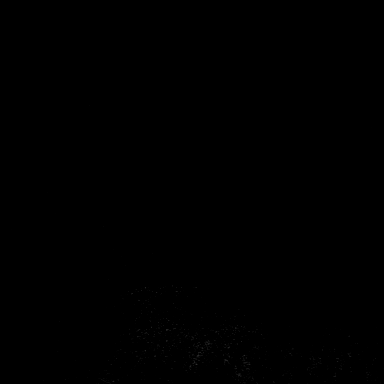
[im 32/160]
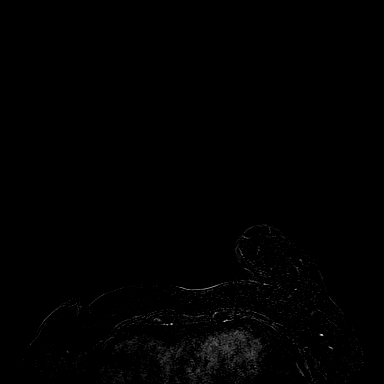
[im 64/160]
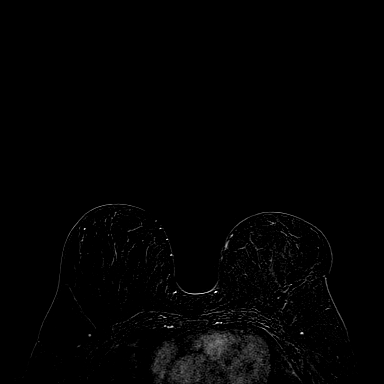

[33 of 48 positions shown; findings below may reference images not displayed]

Three-dimensional MR images were rendered by post-processing of the
original MR data on an independent workstation. The
three-dimensional MR images were interpreted, and findings are
reported in the following complete MRI report for this study. Three
dimensional images were evaluated at the independent interpreting
workstation using the DynaCAD thin client.
FINDINGS: Breast composition: b. Scattered fibroglandular tissue.

Background parenchymal enhancement: Mild.

Right breast: No mass or abnormal enhancement.

Left breast: Post lumpectomy changes laterally. No mass or abnormal
enhancement.

Lymph nodes: No abnormal appearing lymph nodes.

Ancillary findings:  None.
IMPRESSION: No evidence of malignancy.

RECOMMENDATION:
Bilateral screening mammogram in 6 months when due.

BI-RADS CATEGORY  2: Benign.

## 2020-05-28 MED ORDER — GADOBUTROL 1 MMOL/ML IV SOLN
8.0000 mL | Freq: Once | INTRAVENOUS | Status: AC | PRN
  Administered 2020-05-28: 8 mL via INTRAVENOUS

## 2020-05-29 ENCOUNTER — Encounter: Payer: Self-pay | Admitting: Oncology

## 2020-05-31 ENCOUNTER — Other Ambulatory Visit: Payer: Self-pay | Admitting: Oncology

## 2020-06-11 NOTE — Progress Notes (Signed)
[facility]  [phone] [phone]     ID: Bethany [name] DOB: 1960-07-19  MR#: [id number]  EPP#:[id number]  Patient Care Team: Leeroy Cha, MD as PCP - General (Internal Medicine) Burnell Blanks, MD as PCP - Cardiology (Cardiology) Erroll Luna, MD as Consulting Physician (General Surgery) Chablis Losh, Virgie Dad, MD as Consulting Physician (Oncology) Kyung Rudd, MD as Consulting Physician (Radiation Oncology) Irene Shipper, MD as Consulting Physician (Gastroenterology) [name], MD as Consulting Physician (Obstetrics and Gynecology) Delrae Rend, MD as Consulting Physician (Endocrinology) Mauro Kaufmann, RN as Oncology Nurse Navigator Rockwell Germany, RN as Oncology Nurse Navigator Icard, Octavio Graves, DO as Consulting Physician (Pulmonary Disease) Mcarthur Rossetti, MD as Consulting Physician (Orthopedic Surgery) Irene Limbo, MD as Consulting Physician (Plastic Surgery) OTHER MD:   CHIEF COMPLAINT: Estrogen receptor positive breast cancer  CURRENT TREATMENT: Observation   INTERVAL HISTORY: Bethany [name] returns today for follow-up of her estrogen receptor positive breast cancer. She is now under observation due to her inability to tolerate antiestrogens.  Since her last visit, she underwent breast MRI on 05/28/2020 showing: breast composition B; no evidence of malignancy.   REVIEW OF SYSTEMS: Bethany [name] continues to be concerned about the possibility of breast cancer and she really thinks unless she has bilateral mastectomies she is not going to be able to sleep at night.  She is interested in the D IEP procedure but she wanted the discussion today of other possibilities.  She has met with Dr. Iran Planas here and did like Dr. Iran Planas when she met her but of course that she does not do the D IEP procedures.  Aside from that Devine is not exercising regularly at this point but she tells me none of the weather is  improving she is planning to.  Detailed review of systems was otherwise stable   COVID 19 VACCINATION STATUS: Status post Pfizer x2 most recently April 2021    HISTORY OF CURRENT ILLNESS: The original intake note:  "Bethany [name]" had routine screening mammography on 10/26/2017 showing a possible abnormality in the left breast. She underwent unilateral left diagnostic mammography with tomography and left breast ultrasonography at [facility] on 10/18/2017 showing: breast density category B. There was a hypoechoic lesion consistent of a mass at the 2:30 o'clock upper outer quadrant middle depth and measuring 0.5 x 0.3 x 0.4 cm. Sonographic evaluation of the left axilla shows no enlarged or abnormal lymph nodes.  An attempt was made to obtain a biopsy of this lesion on 11/01/2017, however the patient had repeated episodes of syncope during the attempted procedure.  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 Margins were negative for carcinoma. Prognostic indicators significant for: estrogen receptor, 90% positive and progesterone receptor, 80% positive, both with strong staining intensity. Proliferation marker Ki67 at 3%. HER2 negative with an immunohistochemistry of (1+).  Then in a separate procedure she underwent left sentinel lymph node sampling on 12/09/2017 showing ([id number]): Four left axillary sentinel lymph were negative for carcinoma. (0/4).   Her subsequent history is as detailed below.   PAST MEDICAL HISTORY: Past Medical History:  Diagnosis Date  . ADD (attention deficit disorder)   . Anxiety   . B12 deficiency   . Breast cancer ([place]) 2019   left breast cancer/diagnosed in 10/2017/taking radiation treatment until 03/15/18  . Breast mass, left 11/2017   going thru radiation until 02/2018  . CAD (coronary artery disease)  a. DES to LAD 06/2019.  Marland Kitchen Chest pain 06/19/2014  . COPD (chronic  obstructive pulmonary disease) ([place])    beginning stages/small scar  . Dental crowns present   . Depression   . Hiatal hernia   . History of hiatal hernia    no current med.  Marland Kitchen History of thyroid cancer 12/15/2017  . Hypertension    states under control with med., has been on med. x 1 yr.  . Hypothyroidism   . Malignant neoplasm of upper-outer quadrant of left breast in female, estrogen receptor positive ([place])   . Mild hyperlipidemia   . 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 MVP (mitral valve prolapse)   . Personal history of radiation therapy 2019   Left Breast Cancer  . PONV (postoperative nausea and vomiting)   . 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 Syncope and collapse 12/25/2008   Qualifier: Diagnosis of  By: Philemon Kingdom    . Urinary incontinence    USI   . Vitamin D deficiency   Thyroid cancer, GERD   PAST SURGICAL HISTORY: Past Surgical History:  Procedure Laterality Date  . ABDOMINAL HYSTERECTOMY     partial  . APPENDECTOMY    . AXILLARY SENTINEL NODE BIOPSY Left 12/09/2017   Procedure: AXILLARY SENTINEL NODE BIOPSY;  Surgeon: Erroll Luna, MD;  Location: [place];  Service: General;  Laterality: Left;  . BREAST EXCISIONAL BIOPSY Right 09/20/2019  . BREAST LUMPECTOMY Left 11/2017  . BREAST LUMPECTOMY WITH NEEDLE LOCALIZATION Left 11/25/2017   Procedure: LEFT BREAST LUMPECTOMY WITH NEEDLE LOCALIZATION;  Surgeon: Erroll Luna, MD;  Location: [facility];  Service: General;  Laterality: Left;  . BREAST LUMPECTOMY WITH RADIOACTIVE SEED LOCALIZATION Right 09/20/2019   Procedure: RIGHT BREAST LUMPECTOMY WITH RADIOACTIVE SEED LOCALIZATION;  Surgeon: Erroll Luna, MD;  Location: [facility];  Service: General;  Laterality: Right;  . COLONOSCOPY WITH PROPOFOL  10/03/2015  . CORONARY STENT INTERVENTION N/A 06/29/2019   Procedure: CORONARY STENT INTERVENTION;  Surgeon: Burnell Blanks, MD;  Location: [facility];  Service: Cardiovascular;  Laterality: N/A;  . LAPAROSCOPIC APPENDECTOMY N/A 10/03/2015   Procedure: APPENDECTOMY LAPAROSCOPIC;  Surgeon: Rolm Bookbinder, MD;  Location: [facility];  Service: General;  Laterality: N/A;  . LEFT HEART CATH AND CORONARY ANGIOGRAPHY N/A 06/29/2019   Procedure: LEFT HEART CATH AND CORONARY ANGIOGRAPHY;  Surgeon: Burnell Blanks, MD;  Location: [facility];  Service: Cardiovascular;  Laterality: N/A;  . TOTAL THYROIDECTOMY  04/02/2003  Hysterectomy without BSO   FAMILY HISTORY Family History  Problem Relation Age of Onset  . Hypertension Father   . Heart disease Father   . Non-Hodgkin's lymphoma Father   . Cancer Father   . Depression Father   . Anxiety disorder Father   . Alcoholism Father   . Hypertension Brother   . Heart disease Paternal Grandfather   . Colon cancer Other        great maternal aunt   . Colon polyps Mother   . High Cholesterol Mother   . Alcoholism Mother   . Stomach cancer Neg Hx   . Rectal cancer Neg Hx   . Esophageal cancer Neg Hx   The patient's father died in 03/31/17 age age 87 due to non-Hodgkin's lymphoma. The patient's mother is alive at age 12 as of October 2019. The patient has 1 brother, no sisters. There was a paternal aunt with lung cancer. There was a paternal grandmother and  maternal grandfather with throat cancer. She denies a family history of breast or ovarian cancer.    GYNECOLOGIC HISTORY:  No LMP recorded. Patient has had a hysterectomy. Menarche: 60 years old Age at first live birth: 60 years old She is GX P3.  She is status post partial hysterectomy (without BSO) in her late 28's. She did not have HRT.    SOCIAL HISTORY: (updated 09/2018) Quenisha owns a jewelry business. Her husband, Patrick Jupiter, is a Clinical cytogeneticist for [employer]. At home is her, her husband, and their two dogs. The patient's oldest son, [place], has 3 children and lives in [place] and works in Nurse, children's for [employer]. The  patient's son, Alpha Gula lives in [place] with 1 daughter and works as a Teacher, music. The patient's youngest son, Aline Brochure lives in [place] and works for [employer] in [place]. The patient has 5 grandchildren total. Her grandchildren are ages 63, 53, 110, 72, and 3. She notes the youngest has Down Syndrome and recently turned 3. She does not currently belong to a church, but she is [place].     ADVANCED DIRECTIVES: In the absence of any documentation to the contrary, the patient's spouse is their HCPOA.    HEALTH MAINTENANCE: Social History   Tobacco Use  . Smoking status: Former Smoker    Quit date: 08/14/2014    Years since quitting: 5.8  . Smokeless tobacco: Never Used  Vaping Use  . Vaping Use: Never used  Substance Use Topics  . Alcohol use: Yes    Comment: rarely  . Drug use: No    Colonoscopy: 03/15/2018- to be repeated in 3 years under Dr. Henrene Pastor  PAP:   Bone density: 10/25/2017 showed osteopenia   Allergies  Allergen Reactions  . Oxycodone Nausea And Vomiting  . Latex Rash  . Other Rash    "Sutures caused severe rash."  . Sulfa Antibiotics     hives    Current Outpatient Medications  Medication Sig Dispense Refill  . albuterol (VENTOLIN HFA) 108 (90 Base) MCG/ACT inhaler Inhale 2 puffs into the lungs every 6 (six) hours as needed for wheezing or shortness of breath. 18 g 6  . aspirin EC 81 MG tablet Take 81 mg by mouth daily.    . bacitracin ointment Apply 1 application topically 2 (two) times daily. 120 g 0  . clopidogrel (PLAVIX) 75 MG tablet Take 1 tablet (75 mg total) by mouth daily. 90 tablet 3  . ibuprofen (ADVIL) 600 MG tablet Take 1 tablet (600 mg total) by mouth every 6 (six) hours as needed. 30 tablet 0  . levothyroxine (SYNTHROID, LEVOTHROID) 137 MCG tablet Take 137 mcg by mouth at bedtime.     Marland Kitchen losartan-hydrochlorothiazide (HYZAAR) 50-12.5 MG tablet Take 1 tablet by mouth daily. 90 tablet 2  . Multiple Vitamin (MULTIVITAMIN WITH MINERALS) TABS tablet Take 1  tablet by mouth daily.    . nitroGLYCERIN (NITROSTAT) 0.4 MG SL tablet Place 1 tablet (0.4 mg total) under the tongue every 5 (five) minutes as needed for chest pain. 25 tablet 3  . Phentermine-Topiramate 11.25-69 MG CP24 1 capsule daily 30 capsule 0  . rosuvastatin (CRESTOR) 40 MG tablet Take 1 tablet (40 mg total) by mouth daily. 90 tablet 3  . venlafaxine XR (EFFEXOR-XR) 150 MG 24 hr capsule TAKE 1 CAPSULE (150 MG) BY MOUTH ONCE A DAY WITH BREAKFAST 90 capsule 5  . Vitamin D, Ergocalciferol, (DRISDOL) 1.25 MG (50000 UNIT) CAPS capsule Take 1 capsule (50,000 Units total) by mouth every  7 (seven) days. 4 capsule 0   No current facility-administered medications for this visit.    OBJECTIVE: White woman who appears well  Vitals:   06/12/20 1526  BP: 124/82  Pulse: 90  Resp: 20  Temp: 97.7 F (36.5 C)  SpO2: 99%     Body mass index is 33.56 kg/m.   Wt Readings from Last 3 Encounters:  06/12/20 207 lb 14.4 oz (94.3 kg)  02/28/20 198 lb (89.8 kg)  02/06/20 196 lb (88.9 kg)     ECOG FS:1 - Symptomatic but completely ambulatory  Sclerae unicteric, EOMs intact Wearing a mask No cervical or supraclavicular adenopathy Lungs no rales or rhonchi Heart regular rate and rhythm Abd soft, nontender, positive bowel sounds MSK no focal spinal tenderness, no upper extremity lymphedema Neuro: nonfocal, well oriented, appropriate affect Breasts: The right breast is benign.  The left breast is status post lumpectomy and radiation.  There is no evidence of local recurrence.  Both axillae are benign.   LAB RESULTS:  CMP     Component Value Date/Time   NA 141 02/15/2020 1058   NA 141 09/19/2019 1155   K 4.4 02/15/2020 1058   CL 108 02/15/2020 1058   CO2 26 02/15/2020 1058   GLUCOSE 95 02/15/2020 1058   BUN 23 (H) 02/15/2020 1058   BUN 11 09/19/2019 1155   CREATININE 0.81 02/15/2020 1058   CREATININE 0.78 12/16/2017 1540   CALCIUM 8.6 (L) 02/15/2020 1058   PROT 6.5 02/15/2020 1058    PROT 6.5 09/19/2019 1155   ALBUMIN 3.6 02/15/2020 1058   ALBUMIN 4.3 09/19/2019 1155   AST 18 02/15/2020 1058   AST 12 (L) 12/16/2017 1540   ALT 26 02/15/2020 1058   ALT 16 12/16/2017 1540   ALKPHOS 77 02/15/2020 1058   BILITOT 0.4 02/15/2020 1058   BILITOT 0.3 09/19/2019 1155   BILITOT 0.4 12/16/2017 1540   GFRNONAA >60 02/15/2020 1058   GFRNONAA >60 12/16/2017 1540   GFRAA >60 10/12/2019 0812   GFRAA >60 12/16/2017 1540    No results found for: TOTALPROTELP, ALBUMINELP, A1GS, A2GS, BETS, BETA2SER, GAMS, MSPIKE, SPEI  No results found for: KPAFRELGTCHN, LAMBDASER, KAPLAMBRATIO  Lab Results  Component Value Date   WBC 5.9 06/12/2020   NEUTROABS 3.2 06/12/2020   HGB 13.0 06/12/2020   HCT 39.4 06/12/2020   MCV 90.0 06/12/2020   PLT 307 06/12/2020   No results found for: LABCA2  No components found for: [id number]  No results for input(s): INR in the last 168 hours.  No results found for: LABCA2  No results found for: QMG867  No results found for: YPP509  No results found for: TOI712  No results found for: CA2729  No components found for: HGQUANT  No results found for: CEA1 / No results found for: CEA1   No results found for: AFPTUMOR  No results found for: CHROMOGRNA  No results found for: HGBA, HGBA2QUANT, HGBFQUANT, HGBSQUAN (Hemoglobinopathy evaluation)   No results found for: LDH  No results found for: IRON, TIBC, IRONPCTSAT (Iron and TIBC)  No results found for: FERRITIN  Urinalysis    Component Value Date/Time   COLORURINE YELLOW 10/02/2015 2022   APPEARANCEUR CLOUDY (A) 10/02/2015 2022   LABSPEC 1.020 10/02/2015 2022   PHURINE 6.5 10/02/2015 2022   GLUCOSEU NEGATIVE 10/02/2015 2022   HGBUR NEGATIVE 10/02/2015 2022   BILIRUBINUR NEGATIVE 10/02/2015 2022   KETONESUR 15 (A) 10/02/2015 2022   PROTEINUR NEGATIVE 10/02/2015 2022   UROBILINOGEN 0.2 09/20/2014 1536  NITRITE NEGATIVE 10/02/2015 2022   LEUKOCYTESUR SMALL (A) 10/02/2015 2022     STUDIES: MR BREAST BILATERAL W WO CONTRAST INC CAD  Result Date: 05/29/2020 CLINICAL DATA:  Status post left lumpectomy for breast cancer in 2019, followed by radiation therapy. Right breast benign excisional biopsy on 09/19/2019 for papilloma removal. LABS:  None obtained on site today. EXAM: BILATERAL BREAST MRI WITH AND WITHOUT CONTRAST TECHNIQUE: Multiplanar, multisequence MR images of both breasts were obtained prior to and following the intravenous administration of 8 ml of Gadavist Three-dimensional MR images were rendered by post-processing of the original MR data on an independent workstation. The three-dimensional MR images were interpreted, and findings are reported in the following complete MRI report for this study. Three dimensional images were evaluated at the independent interpreting workstation using the DynaCAD thin client. COMPARISON:  Previous exam(s). The patient's last bilateral mammogram was a diagnostic mammogram dated 11/29/2019. FINDINGS: Breast composition: b. Scattered fibroglandular tissue. Background parenchymal enhancement: Mild. Right breast: No mass or abnormal enhancement. Left breast: Post lumpectomy changes laterally. No mass or abnormal enhancement. Lymph nodes: No abnormal appearing lymph nodes. Ancillary findings:  None. IMPRESSION: No evidence of malignancy. RECOMMENDATION: Bilateral screening mammogram in 6 months when due. BI-RADS CATEGORY  2: Benign. Electronically Signed   By: Claudie Revering M.D.   On: 05/29/2020 12:53     ELIGIBLE FOR AVAILABLE RESEARCH PROTOCOL: No   ASSESSMENT: 60 y.o. Bethany [name], [name] woman status post left breast upper outer quadrant lumpectomy 11/25/2017 for a pT1b pNX, stage Ia invasive ductal carcinoma, grade 1, estrogen and progesterone receptor positive, HER-2/neu negative, with an MIB-1 of 3%  (1) status post left axillary lymph node sampling 12/09/2017, all 4 sentinel lymph nodes clear  (2) Oncotype score of 24 predicts a 10 %  risk of recurrence outside the breast over the next 9 years if the patient's only systemic therapy is an estrogens for 5 years.  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  (7) exemestane started 10/12/2019, discontinued 11/26/2019 with intolerable side effects  (a) bone density November 2021  (8) intensified screening:  (a) breast MRI March 2022  PLAN: Bethany [name] about the possibility of breast cancer.  We discussed the MRI and I gave her a copy of the report.  This is very favorable.  There is absolutely no evidence of anything resembling breast cancer there.  Nevertheless she is already worrying about the mammogram coming in September and whether that will show anything.  She is pretty motivated to have both breast removed.  We went through all the possibilities.  She is considering a D IEP at [facility] but has not been able to get an appointment there.  Today she tells me she would be interested in nipple sparing mastectomies with implant reconstruction.  We discussed that at some detail.  She would like to meet with Dr. Brantley Stage her  prior surgeon and see if he would be willing to steer her through that process.  I will make that request.  Otherwise she will have mammography in September and a visit with me in October  She knows to call for any other issue that  may develop before then  Total encounter time 25 minutes.*   Moneka Mcquinn, Virgie Dad, MD  06/12/20 3:35 PM Medical Oncology and Hematology [facility] [place], [place] [address] Tel. [phone]    Fax. [phone]   I, Wilburn Mylar, am acting as scribe for Dr. Virgie Dad. Shaely Gadberry.  I, Lurline Del MD, have reviewed the above documentation for accuracy and completeness, and I agree with the above.   *Total Encounter Time as defined by the Centers for Medicare and Medicaid Services includes, in addition to the face-to-face time of a patient visit (documented in the note above) non-face-to-face time: obtaining and reviewing outside history, ordering and reviewing medications, tests or procedures, care coordination (communications with other health care professionals or caregivers) and documentation in the medical record.

## 2020-06-12 ENCOUNTER — Other Ambulatory Visit: Payer: Self-pay

## 2020-06-12 ENCOUNTER — Inpatient Hospital Stay: Payer: 59 | Attending: Oncology | Admitting: Oncology

## 2020-06-12 ENCOUNTER — Inpatient Hospital Stay: Payer: 59

## 2020-06-12 VITALS — BP 124/82 | HR 90 | Temp 97.7°F | Resp 20 | Ht 66.0 in | Wt 207.9 lb

## 2020-06-12 DIAGNOSIS — F329 Major depressive disorder, single episode, unspecified: Secondary | ICD-10-CM | POA: Insufficient documentation

## 2020-06-12 DIAGNOSIS — Z170 Estrogen receptor positive status [ER+]: Secondary | ICD-10-CM

## 2020-06-12 DIAGNOSIS — C50412 Malignant neoplasm of upper-outer quadrant of left female breast: Secondary | ICD-10-CM | POA: Insufficient documentation

## 2020-06-12 DIAGNOSIS — I341 Nonrheumatic mitral (valve) prolapse: Secondary | ICD-10-CM | POA: Insufficient documentation

## 2020-06-12 DIAGNOSIS — Z801 Family history of malignant neoplasm of trachea, bronchus and lung: Secondary | ICD-10-CM | POA: Diagnosis not present

## 2020-06-12 DIAGNOSIS — K449 Diaphragmatic hernia without obstruction or gangrene: Secondary | ICD-10-CM | POA: Insufficient documentation

## 2020-06-12 DIAGNOSIS — Z79899 Other long term (current) drug therapy: Secondary | ICD-10-CM | POA: Insufficient documentation

## 2020-06-12 DIAGNOSIS — Z923 Personal history of irradiation: Secondary | ICD-10-CM | POA: Diagnosis not present

## 2020-06-12 DIAGNOSIS — Z7982 Long term (current) use of aspirin: Secondary | ICD-10-CM | POA: Insufficient documentation

## 2020-06-12 DIAGNOSIS — I10 Essential (primary) hypertension: Secondary | ICD-10-CM | POA: Diagnosis not present

## 2020-06-12 DIAGNOSIS — E785 Hyperlipidemia, unspecified: Secondary | ICD-10-CM | POA: Insufficient documentation

## 2020-06-12 DIAGNOSIS — I2510 Atherosclerotic heart disease of native coronary artery without angina pectoris: Secondary | ICD-10-CM | POA: Insufficient documentation

## 2020-06-12 DIAGNOSIS — Z87891 Personal history of nicotine dependence: Secondary | ICD-10-CM | POA: Diagnosis not present

## 2020-06-12 DIAGNOSIS — E039 Hypothyroidism, unspecified: Secondary | ICD-10-CM | POA: Insufficient documentation

## 2020-06-12 DIAGNOSIS — E559 Vitamin D deficiency, unspecified: Secondary | ICD-10-CM | POA: Insufficient documentation

## 2020-06-12 DIAGNOSIS — E538 Deficiency of other specified B group vitamins: Secondary | ICD-10-CM | POA: Diagnosis not present

## 2020-06-12 DIAGNOSIS — J449 Chronic obstructive pulmonary disease, unspecified: Secondary | ICD-10-CM | POA: Diagnosis not present

## 2020-06-12 DIAGNOSIS — Z800 Family history of malignant neoplasm of digestive organs: Secondary | ICD-10-CM | POA: Diagnosis not present

## 2020-06-12 LAB — COMPREHENSIVE METABOLIC PANEL
ALT: 24 U/L (ref 0–44)
AST: 18 U/L (ref 15–41)
Albumin: 4.1 g/dL (ref 3.5–5.0)
Alkaline Phosphatase: 85 U/L (ref 38–126)
Anion gap: 12 (ref 5–15)
BUN: 16 mg/dL (ref 6–20)
CO2: 24 mmol/L (ref 22–32)
Calcium: 8.9 mg/dL (ref 8.9–10.3)
Chloride: 102 mmol/L (ref 98–111)
Creatinine, Ser: 0.94 mg/dL (ref 0.44–1.00)
GFR, Estimated: >60 mL/min (ref >60)
Glucose, Bld: 103 mg/dL — ABNORMAL HIGH (ref 70–99)
Potassium: 3.8 mmol/L (ref 3.5–5.1)
Sodium: 138 mmol/L (ref 135–145)
Total Bilirubin: 0.4 mg/dL (ref 0.3–1.2)
Total Protein: 7.3 g/dL (ref 6.5–8.1)

## 2020-06-12 LAB — CBC WITH DIFFERENTIAL/PLATELET
Abs Immature Granulocytes: 0.01 10*3/uL (ref 0.00–0.07)
Basophils Absolute: 0 10*3/uL (ref 0.0–0.1)
Basophils Relative: 1 %
Eosinophils Absolute: 0.2 10*3/uL (ref 0.0–0.5)
Eosinophils Relative: 3 %
HCT: 39.4 % (ref 36.0–46.0)
Hemoglobin: 13 g/dL (ref 12.0–15.0)
Immature Granulocytes: 0 %
Lymphocytes Relative: 35 %
Lymphs Abs: 2.1 10*3/uL (ref 0.7–4.0)
MCH: 29.7 pg (ref 26.0–34.0)
MCHC: 33 g/dL (ref 30.0–36.0)
MCV: 90 fL (ref 80.0–100.0)
Monocytes Absolute: 0.5 10*3/uL (ref 0.1–1.0)
Monocytes Relative: 8 %
Neutro Abs: 3.2 10*3/uL (ref 1.7–7.7)
Neutrophils Relative %: 53 %
Platelets: 307 10*3/uL (ref 150–400)
RBC: 4.38 MIL/uL (ref 3.87–5.11)
RDW: 12.8 % (ref 11.5–15.5)
WBC: 5.9 10*3/uL (ref 4.0–10.5)
nRBC: 0 % (ref 0.0–0.2)

## 2020-06-14 ENCOUNTER — Telehealth: Payer: Self-pay | Admitting: Oncology

## 2020-06-14 ENCOUNTER — Encounter: Payer: Self-pay | Admitting: Oncology

## 2020-06-14 NOTE — Telephone Encounter (Signed)
Scheduled appts per 3/31 los. Pt aware.

## 2020-06-17 ENCOUNTER — Encounter: Payer: Self-pay | Admitting: Physician Assistant

## 2020-06-17 NOTE — Progress Notes (Deleted)
Cardiology Office Note    Date:  06/17/2020   ID:  Bethany [name], DOB 1960-04-22, MRN [id number]  PCP:  Leeroy Cha, MD  Cardiologist:  [name], MD  Electrophysiologist:  None   Chief Complaint: f/u CAD  History of Present Illness:   Bethany [name] is a 60 y.o. female with history of CAD s/p DES to LAD 06/2019, breast cancer in 2019 s/p radiation therapy, hiatal hernia, thyroid cancer, hypothyroidism, HTN, HLD, former tobacco abuse, pre-DM and anxiety who presents for post-cath follow-up. She was seen as a new patient by Dr. Angelena Form for evaluation of chest pain 05/15/19. She had had EGD per Dr. Henrene Pastor 04/12/19 with no cause for chest pain isolated. She is known to have radiation induced lung fibrosis. She underwent gated cardiac CTA on 06/20/19 and this showed a LAD stenosis that appeared to be flow limiting. Echo 06/05/19 showed normal LV systolic function, EF 72-53%, no valve disease, mild asymmetric LVH, mildly elevated PASP. No evidence of MVP previously mentioned in [place]. She previously had breast cancer in the left breast that was treated, and more recently on the right. She has been undergoing radiation therapy with plans for lumpectomy. Due to cardiac finding, this was deferred. She underwent LHC 06/29/19 showing 80% mLAD lesion, treated with PTCA/DES. At that time it was recommended she should continue DAPT for 3 months then stop for her breast surgery.  Asa/plavix plan? Lipids phentermine  CAD Essential HTN Hyperlipidemia goal LDL <70 Mildly elevated PASP/pulm HTN   Labwork independently reviewed: 05/2020 CBC, CMET ok except glu 103 02/2020 A1c 5.7 10/2019 thyroid normal, LDL 99   Past Medical History:  Diagnosis Date  . ADD (attention deficit disorder)   . Anxiety   . B12 deficiency   . Breast cancer ([place]) 2019   left breast cancer/diagnosed in 10/2017/taking radiation treatment until 03/15/18  . Breast mass, left 11/2017   going thru radiation  until 02/2018  . CAD (coronary artery disease)    a. DES to LAD 06/2019.  Marland Kitchen Chest pain 06/19/2014  . COPD (chronic obstructive pulmonary disease) ([place])    beginning stages/small scar  . Dental crowns present   . Depression   . Hiatal hernia   . History of hiatal hernia    no current med.  Marland Kitchen History of thyroid cancer 12/15/2017  . Hypertension    states under control with med., has been on med. x 1 yr.  . Hypothyroidism   . Malignant neoplasm of upper-outer quadrant of left breast in female, estrogen receptor positive ([place])   . Mild hyperlipidemia   . 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 MVP (mitral valve prolapse)   . Personal history of radiation therapy 2019   Left Breast Cancer  . PONV (postoperative nausea and vomiting)   . Radiation fibrosis of lung ([place])   . Syncope and collapse 12/25/2008   Qualifier: Diagnosis of  By: Philemon Kingdom    . Urinary incontinence    USI   . Vitamin D deficiency     Past Surgical History:  Procedure Laterality Date  . ABDOMINAL HYSTERECTOMY     partial  . APPENDECTOMY    . AXILLARY SENTINEL NODE BIOPSY Left 12/09/2017   Procedure: AXILLARY SENTINEL NODE BIOPSY;  Surgeon: Erroll Luna, MD;  Location: [facility];  Service: General;  Laterality: Left;  . BREAST EXCISIONAL BIOPSY Right 09/20/2019  . BREAST LUMPECTOMY Left 11/2017  . BREAST LUMPECTOMY WITH NEEDLE LOCALIZATION Left 11/25/2017   Procedure:  LEFT BREAST LUMPECTOMY WITH NEEDLE LOCALIZATION;  Surgeon: Erroll Luna, MD;  Location: [facility];  Service: General;  Laterality: Left;  . BREAST LUMPECTOMY WITH RADIOACTIVE SEED LOCALIZATION Right 09/20/2019   Procedure: RIGHT BREAST LUMPECTOMY WITH RADIOACTIVE SEED LOCALIZATION;  Surgeon: Erroll Luna, MD;  Location: [facility];  Service: General;  Laterality: Right;  . COLONOSCOPY WITH PROPOFOL  10/03/2015  . CORONARY STENT INTERVENTION N/A 06/29/2019   Procedure: CORONARY STENT  INTERVENTION;  Surgeon: Burnell Blanks, MD;  Location: [facility];  Service: Cardiovascular;  Laterality: N/A;  . LAPAROSCOPIC APPENDECTOMY N/A 10/03/2015   Procedure: APPENDECTOMY LAPAROSCOPIC;  Surgeon: Rolm Bookbinder, MD;  Location: [facility];  Service: General;  Laterality: N/A;  . LEFT HEART CATH AND CORONARY ANGIOGRAPHY N/A 06/29/2019   Procedure: LEFT HEART CATH AND CORONARY ANGIOGRAPHY;  Surgeon: Burnell Blanks, MD;  Location: [facility];  Service: Cardiovascular;  Laterality: N/A;  . TOTAL THYROIDECTOMY  04/02/2003    Current Medications: No outpatient medications have been marked as taking for the 06/19/20 encounter (Appointment) with [name], PA-C.   ***   Allergies:   Oxycodone, Latex, Other, and Sulfa antibiotics   Social History   Socioeconomic History  . Marital status: Married    Spouse name: Bethany [name]  . Number of children: 3  . Years of education: Not on file  . Highest education level: Not on file  Occupational History  . Occupation: stay at home spouse  Tobacco Use  . Smoking status: Former Smoker    Quit date: 08/14/2014    Years since quitting: 5.8  . Smokeless tobacco: Never Used  Vaping Use  . Vaping Use: Never used  Substance and Sexual Activity  . Alcohol use: Yes    Comment: rarely  . Drug use: No  . Sexual activity: Yes    Partners: Male    Birth control/protection: Surgical  Other Topics Concern  . Not on file  Social History Narrative  . Not on file   Social Determinants of Health   Financial Resource Strain: Not on file  Food Insecurity: Not on file  Transportation Needs: Not on file  Physical Activity: Not on file  Stress: Not on file  Social Connections: Not on file     Family History:  The patient's ***family history includes Alcoholism in her father and mother; Anxiety disorder in her father; Cancer in her father; Colon cancer in an other family member; Colon polyps in her mother; Depression in her  father; Heart disease in her father and paternal grandfather; High Cholesterol in her mother; Hypertension in her brother and father; Non-Hodgkin's lymphoma in her father. There is no history of Stomach cancer, Rectal cancer, or Esophageal cancer.  ROS:   Please see the history of present illness. Otherwise, review of systems is positive for ***.  All other systems are reviewed and otherwise negative.    EKGs/Labs/Other Studies Reviewed:    Studies reviewed are outlined and summarized above. Reports included below if pertinent.  Coronary CTA 06/2019 CLINICAL DATA:  Chest pain  EXAM: Cardiac CTA  MEDICATIONS: Sub lingual nitro.  4mg   TECHNIQUE: The patient was scanned on a Siemens Force 253 slice scanner. Gantry rotation speed was 250 msecs. Collimation was .6 mm. A 100 kV prospective scan was triggered in the ascending thoracic aorta at 140 HU's Full mA was used between 35% and 75% of the R-R interval. Average HR during the scan was 51 bpm. The 3D data  set was interpreted on a dedicated work station using MPR, MIP and VRT modes. A total of 80cc of contrast was used.  FINDINGS: Non-cardiac: See separate report from [facility]. No significant findings on limited lung and soft tissue windows.  Calcium Score: Calcium noted in proximal and mid LAD and RCA  Coronary Arteries: Right dominant with no anomalies  LM: Normal  LAD: 25-49% calcified plaque in proximal vessel > 70% mixed plaque in mid LAD  D1: Small vessel  D2: Small vessel  Circumflex: Normal  OM1: Normal  OM2: Normal  RCA: 1-24% calcified plaque in proximal vessel distal vessel is tortuous  PDA: Normal  PLA: Normal  IMPRESSION: 1.  Calcium score 263 which is 98 th percentile for age/sex  2.   Normal aortic root 3.6 cm  3. CAD-RADS 4 likely > 70% hemodynamically significant stenosis in mid LAD Study sent for FFR CT  Jenkins Rouge   Electronically Signed   By:  Jenkins Rouge M.D.   On: 06/20/2019 17:53  CLINICAL DATA:  CAD  EXAM: FFR CT  TECHNIQUE: The best systolic and diastolic phases of the patients gated cardiac CTA sent for hemodynamic analysis  FINDINGS: FFR CT positive in mid LAD 0.79 and 0.76 in distal LAD  IMPRESSION: FFR CT suggesting hemodynamically significant CAD in mid LAD Patient will be referred for heart catheterization  Jenkins Rouge   Electronically Signed   By: Jenkins Rouge M.D.   On: 06/21/2019 09:33  Do not see overread.  2D Echo 06/05/19 IMPRESSIONS    1. Left ventricular ejection fraction, by estimation, is 60 to 65%. The  left ventricle has normal function. The left ventricle has no regional  wall motion abnormalities. There is mild asymmetric left ventricular  hypertrophy. Left ventricular diastolic  parameters were normal.  2. Right ventricular systolic function is normal. The right ventricular  size is normal. There is mildly elevated pulmonary artery systolic  pressure.  3. The mitral valve is normal in structure. No evidence of mitral valve  regurgitation. No evidence of mitral stenosis.  4. The aortic valve is normal in structure. Aortic valve regurgitation is  not visualized. No aortic stenosis is present.   LHC 06/29/19            Mid LAD lesion is 80% stenosed.            A drug-eluting stent was successfully placed using a STENT RESOLUTE ONYX 3.0X18.            Post intervention, there is a 0% residual stenosis.   1. Severe stenosis mid LAD 2. Successful PTCA/DES x 1 mid LAD  Recommendations: 3 months of DAPT with ASA and Plavix and then will stop for her breast surgery.      EKG:  EKG is ordered today, personally reviewed, demonstrating ***  Recent Labs: 10/16/2019: TSH 1.020 06/12/2020: ALT 24; BUN 16; Creatinine, Ser 0.94; Hemoglobin 13.0; Platelets 307; Potassium 3.8; Sodium 138  Recent Lipid Panel    Component Value Date/Time   CHOL 191 10/16/2019 1246    TRIG 107 10/16/2019 1246   HDL 73 10/16/2019 1246   CHOLHDL 2.3 09/19/2019 1155   CHOLHDL 3.7 06/19/2014 2234   VLDL 18 06/19/2014 2234   LDLCALC 99 10/16/2019 1246    PHYSICAL EXAM:    VS:  There were no vitals taken for this visit.  BMI: There is no height or weight on file to calculate BMI.  GEN: Well nourished, well developed female in no  acute distress HEENT: normocephalic, atraumatic Neck: no JVD, carotid bruits, or masses Cardiac: ***RRR; no murmurs, rubs, or gallops, no edema  Respiratory:  clear to auscultation bilaterally, normal work of breathing GI: soft, nontender, nondistended, + BS MS: no deformity or atrophy Skin: warm and dry, no rash Neuro:  Alert and Oriented x 3, Strength and sensation are intact, follows commands Psych: euthymic mood, full affect  Wt Readings from Last 3 Encounters:  06/12/20 207 lb 14.4 oz (94.3 kg)  02/28/20 198 lb (89.8 kg)  02/06/20 196 lb (88.9 kg)     ASSESSMENT & PLAN:   1. ***  Disposition: F/u with ***   Medication Adjustments/Labs and Tests Ordered: Current medicines are reviewed at length with the patient today.  Concerns regarding medicines are outlined above. Medication changes, Labs and Tests ordered today are summarized above and listed in the Patient Instructions accessible in Encounters.   Signed, [name], PA-C  06/17/2020 1:38 PM    [facility] [place], [place], [place]  [address] Phone: [phone]; Fax: [phone]

## 2020-06-19 ENCOUNTER — Ambulatory Visit: Payer: 59 | Admitting: Physician Assistant

## 2020-06-19 DIAGNOSIS — I2510 Atherosclerotic heart disease of native coronary artery without angina pectoris: Secondary | ICD-10-CM

## 2020-06-19 DIAGNOSIS — I10 Essential (primary) hypertension: Secondary | ICD-10-CM

## 2020-06-19 DIAGNOSIS — I272 Pulmonary hypertension, unspecified: Secondary | ICD-10-CM

## 2020-06-19 DIAGNOSIS — E785 Hyperlipidemia, unspecified: Secondary | ICD-10-CM

## 2020-07-26 ENCOUNTER — Ambulatory Visit: Payer: Self-pay | Admitting: Surgery

## 2020-07-26 NOTE — H&P (Signed)
Bethany [name] Appointment: 07/26/2020 10:10 AM Location: [facility] Patient #: [id number] DOB: [birth year] Married / Language: Bethany [name] / Race: White Female  History of Present Illness Bethany [name] A. Bethany Kowalski MD; 07/26/2020 10:34 AM) Patient words: Patient returns for follow-up with diagnosis of left breast cancer from September 2019 to breast conserving surgery followed by radiation therapy. She was placed on multiple anti-estrogen therapy since failed very to significant severe side effects. She is following closely with semiannual magnetic resonance imagings. Her last was in March 2022 was clear. She has concerns about recurrent breast cancer since she could no longer tolerate medical management and initiation breast reducing surgery to limit her risk since she cannot take medications that reduce her risk without significant side effects. She said multiple breast biopsies for papillomas as well. This is causing significant mental stress for her. She complains of no breast masses nipple discharge or other issues with her breasts at this point in time.        60 y.o. Bethany [name], [name] woman status post left breast upper outer quadrant lumpectomy 11/25/2017 for a pT1b pNX, stage Ia invasive ductal carcinoma, grade 1, estrogen and progesterone receptor positive, HER-2/neu negative, with an MIB-1 of 3%  (1) status post left axillary lymph node sampling 12/09/2017, all 4 sentinel lymph nodes clear  (2) Oncotype score of 24 predicts a 10 % risk of recurrence outside the breast over the next 9 years if the patient's only systemic therapy is an estrogens for 5 years. 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  (7) exemestane started 10/12/2019, discontinued 11/26/2019 with intolerable side effects (a) bone density November 2021  (8) intensified screening: (a) breast MRI March 2022            Status post left lumpectomy for breast cancer in 2019, followed by radiation therapy. Right breast benign excisional biopsy on 09/19/2019 for papilloma removal.  LABS: None obtained on site today.  EXAM: BILATERAL BREAST MRI WITH AND WITHOUT CONTRAST  TECHNIQUE: Multiplanar, multisequence MR images of both breasts were obtained prior to and following the intravenous administration of 8 ml of Gadavist  Three-dimensional MR images were rendered by post-processing of the original MR data on an independent workstation. The three-dimensional MR images were interpreted, and findings are reported in the following complete MRI report for this study. Three dimensional images were evaluated at the independent interpreting workstation using the DynaCAD thin client.  COMPARISON: Previous exam(s). The patient's last bilateral mammogram was a diagnostic mammogram dated 11/29/2019.  FINDINGS: Breast composition: b. Scattered fibroglandular tissue.  Background parenchymal enhancement: Mild.  Right breast: No mass or abnormal enhancement.  Left breast: Post lumpectomy changes laterally. No mass or abnormal enhancement.  Lymph nodes: No abnormal appearing lymph nodes.  Ancillary findings: None.  IMPRESSION: No evidence of malignancy.  RECOMMENDATION: Bilateral screening mammogram in  6 months when due.  BI-RADS CATEGORY 2: Benign.   Electronically Signed By: Bethany Lipps  Bethany [name] M.D. On: 05/29/2020 12:53.  The patient is a 60 year old female.   Allergies Bethany Forehand, CNA; 07/26/2020 10:08 AM) oxyCODONE HCl *ANALGESICS - OPIOID* Sulfa Antibiotics Latex Allergies Reconciled  Medication History Bethany Forehand, CNA; 07/26/2020 10:08 AM) Valium (2MG Tablet, 1 (one) Oral as needed, Taken starting 05/12/2019) Active. Vitamin D (Ergocalciferol) (50000UNIT Capsule, Oral) Active. Levothyroxine Sodium (137MCG Tablet, Oral) Active. Venlafaxine HCl ER (37.5MG Capsule ER 24HR, Oral) Active. Albuterol (90MCG/ACT Aerosol Soln, Inhalation) Active. Rosuvastatin Calcium (40MG Tablet, Oral) Active. Aspirin (81MG Tablet, Oral) Active. Multiple Vitamins (Oral) Active. Medications Reconciled     Review of Systems (Bethany Henk A. Jamai Dolce MD; 07/26/2020 10:34 AM) All other systems negative   Physical Exam (Bethany Perlman A. Mauria Asquith MD; 07/26/2020 10:37 AM)  General Mental Status-Alert. General Appearance-Consistent with stated age. Hydration-Well hydrated. Voice-Normal.  Head and Neck Head-normocephalic, atraumatic with no lesions or palpable masses. Trachea-midline. Thyroid Gland Characteristics - normal size and consistency.  Chest and Lung Exam Chest and lung exam reveals -quiet, even and easy respiratory effort with no use of accessory muscles and on auscultation, normal breath sounds, no adventitious sounds and normal vocal resonance. Inspection Chest Wall - Normal. Back - normal.  Breast Note: Left breast scar noted. No masses. Postradiation changes noted. Right breast is normal. large / ptotic  Cardiovascular Note: Normal sinus rhythm.  Neurologic Neurologic evaluation reveals -alert and oriented x 3 with no impairment of recent or remote memory. Mental Status-Normal.  Lymphatic Head & Neck  General Head & Neck Lymphatics:  Bilateral - Description - Normal. Axillary  General Axillary Region: Bilateral - Description - Normal. Tenderness - Non Tender.    Assessment & Plan (Bethany Spirito A. Libero Puthoff MD; 07/26/2020 10:38 AM)  AT HIGH RISK FOR BREAST CANCER (Z91.89) Impression: Discussed bilateral risk reducing mastectomy due to her high risk and inability to take medications to reduce this risk. Given her breast size, I do not feel she is a good candidate for nipple preserving mastectomy but will refer to plastic surgery for an opinion. I discussed bilateral simple mastectomy as well as reconstruction options to her. We discussed different types of flaps as well including free flaps, TRAM flaps and other potential flaps. She has done her own research as well. She is in agreement to proceed with bilateral simple mastectomy but I will refer to plastic surgery prior to this for reconstruction options. Discussed treatment options for breast cancer to include breast conservation vs mastectomy with reconstruction. Pt has decided on mastectomy. Risk include bleeding, infection, flap necrosis, pain, numbness, recurrence, hematoma, other surgery needs. Pt understands and agrees to proceed.   total time 35 minutes  Current Plans Pt Education - [facility] Mastectomy HCI  BREAST CANCER, LEFT (C50.912) Impression: NAD  TOTAL TIME 20 MINUTES   BREAST MASS, RIGHT (N63.10)

## 2020-07-30 ENCOUNTER — Encounter: Payer: Self-pay | Admitting: Plastic Surgery

## 2020-07-30 ENCOUNTER — Ambulatory Visit (INDEPENDENT_AMBULATORY_CARE_PROVIDER_SITE_OTHER): Payer: 59 | Admitting: Plastic Surgery

## 2020-07-30 ENCOUNTER — Other Ambulatory Visit: Payer: Self-pay

## 2020-07-30 VITALS — BP 143/90 | HR 82 | Ht 66.0 in | Wt 206.0 lb

## 2020-07-30 DIAGNOSIS — C50412 Malignant neoplasm of upper-outer quadrant of left female breast: Secondary | ICD-10-CM

## 2020-07-30 DIAGNOSIS — Z170 Estrogen receptor positive status [ER+]: Secondary | ICD-10-CM

## 2020-07-30 NOTE — Progress Notes (Signed)
Patient ID: Bethany [name], female    DOB: [birth year], 60 y.o.   MRN: [id number]   Chief Complaint  Patient presents with  . consult  . Breast Problem    The patient is a 60 year old female here for evaluation and consultation for her breasts.  The patient underwent a partial mastectomy in September 2019 for left-sided breast cancer.  It was in the upper outer quadrant.  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  Estrogen and progesterone receptors were positive and HER2 negative.  She received a partial mastectomy followed by radiation and chemotherapy.  She also had a lumpectomy of the right breast which was negative for malignancy there is concern that her risk factors are high and she needs to undergo bilateral mastectomies because she is unable to take medical therapy for reducing her risk.  She wishes surgical management and has requested surgery.  She is interested in bilateral mastectomies and understands that her nipples would not be salvageable due to her previous surgery and breast size.  She is 5 feet 6 inches tall and weighs 206 pounds.  She is very keen to stay as large as she is able.  The skin has healed nicely on the left radiated side.  At some point she had taken Plavix.  She is not taking it now.  She has a history of COPD, hypertension, thyroid cancer, depression, hiatal hernia, prediabetic, breast cancer, the full list noted below.  She quit smoking in 2016.  Her general surgeon is Dr. Brantley Stage.   Review of Systems  Constitutional: Negative.   HENT: Negative.   Eyes: Negative.   Respiratory: Negative.   Cardiovascular: Negative.   Gastrointestinal: Negative.   Endocrine: Negative.   Genitourinary: Negative.   Musculoskeletal: Negative.   Skin: Negative for wound.  Allergic/Immunologic: Negative.   Hematological: Negative.   Psychiatric/Behavioral: Negative.     Past Medical History:  Diagnosis Date  . ADD (attention deficit disorder)   . Anxiety   . B12  deficiency   . Breast cancer ([place]) 2019   left breast cancer/diagnosed in 10/2017/taking radiation treatment until 03/15/18  . Breast mass, left 11/2017   going thru radiation until 02/2018  . CAD (coronary artery disease)    a. DES to LAD 06/2019.  Marland Kitchen Chest pain 06/19/2014  . COPD (chronic obstructive pulmonary disease) ([place])    beginning stages/small scar  . Dental crowns present   . Depression   . Hiatal hernia   . History of hiatal hernia    no current med.  Marland Kitchen History of thyroid cancer 12/15/2017  . Hypertension    states under control with med., has been on med. x 1 yr.  . Hypothyroidism   . Malignant neoplasm of upper-outer quadrant of left breast in female, estrogen receptor positive ([place])   . Mild hyperlipidemia   . 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 MVP (mitral valve prolapse)    not seen on echo 05/2019  . Personal history of radiation therapy 2019   Left Breast Cancer  . PONV (postoperative nausea and vomiting)   . Prediabetes   . 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 Syncope and collapse 12/25/2008   Qualifier: Diagnosis of  By: Philemon Kingdom    . Urinary incontinence    USI   . Vitamin D deficiency     Past Surgical History:  Procedure Laterality Date  . ABDOMINAL HYSTERECTOMY     partial  . APPENDECTOMY    .  AXILLARY SENTINEL NODE BIOPSY Left 12/09/2017   Procedure: AXILLARY SENTINEL NODE BIOPSY;  Surgeon: Erroll Luna, MD;  Location: [facility];  Service: General;  Laterality: Left;  . BREAST EXCISIONAL BIOPSY Right 09/20/2019  . BREAST LUMPECTOMY Left 11/2017  . BREAST LUMPECTOMY WITH NEEDLE LOCALIZATION Left 11/25/2017   Procedure: LEFT BREAST LUMPECTOMY WITH NEEDLE LOCALIZATION;  Surgeon: Erroll Luna, MD;  Location: [facility];  Service: General;  Laterality: Left;  . BREAST LUMPECTOMY WITH RADIOACTIVE SEED LOCALIZATION Right 09/20/2019   Procedure: RIGHT BREAST LUMPECTOMY WITH RADIOACTIVE SEED LOCALIZATION;  Surgeon:  Erroll Luna, MD;  Location: [facility];  Service: General;  Laterality: Right;  . COLONOSCOPY WITH PROPOFOL  10/03/2015  . CORONARY STENT INTERVENTION N/A 06/29/2019   Procedure: CORONARY STENT INTERVENTION;  Surgeon: Burnell Blanks, MD;  Location: [facility];  Service: Cardiovascular;  Laterality: N/A;  . LAPAROSCOPIC APPENDECTOMY N/A 10/03/2015   Procedure: APPENDECTOMY LAPAROSCOPIC;  Surgeon: Rolm Bookbinder, MD;  Location: [facility];  Service: General;  Laterality: N/A;  . LEFT HEART CATH AND CORONARY ANGIOGRAPHY N/A 06/29/2019   Procedure: LEFT HEART CATH AND CORONARY ANGIOGRAPHY;  Surgeon: Burnell Blanks, MD;  Location: [facility];  Service: Cardiovascular;  Laterality: N/A;  . TOTAL THYROIDECTOMY  04/02/2003      Current Outpatient Medications:  .  albuterol (VENTOLIN HFA) 108 (90 Base) MCG/ACT inhaler, Inhale 2 puffs into the lungs every 6 (six) hours as needed for wheezing or shortness of breath., Disp: 18 g, Rfl: 6 .  clopidogrel (PLAVIX) 75 MG tablet, Take 1 tablet (75 mg total) by mouth daily., Disp: 90 tablet, Rfl: 3 .  levothyroxine (SYNTHROID, LEVOTHROID) 137 MCG tablet, Take 137 mcg by mouth at bedtime. , Disp: , Rfl:  .  losartan-hydrochlorothiazide (HYZAAR) 50-12.5 MG tablet, Take 1 tablet by mouth daily., Disp: 90 tablet, Rfl: 2 .  Multiple Vitamin (MULTIVITAMIN WITH MINERALS) TABS tablet, Take 1 tablet by mouth daily., Disp: , Rfl:  .  Phentermine-Topiramate 11.25-69 MG CP24, 1 capsule daily, Disp: 30 capsule, Rfl: 0 .  venlafaxine XR (EFFEXOR-XR) 150 MG 24 hr capsule, TAKE 1 CAPSULE (150 MG) BY MOUTH ONCE A DAY WITH BREAKFAST, Disp: 90 capsule, Rfl: 5 .  Vitamin D, Ergocalciferol, (DRISDOL) 1.25 MG (50000 UNIT) CAPS capsule, Take 1 capsule (50,000 Units total) by mouth every 7 (seven) days., Disp: 4 capsule, Rfl: 0 .  aspirin EC 81 MG tablet, Take 81 mg by mouth daily., Disp: , Rfl:  .  bacitracin ointment, Apply 1 application  topically 2 (two) times daily., Disp: 120 g, Rfl: 0 .  ibuprofen (ADVIL) 600 MG tablet, Take 1 tablet (600 mg total) by mouth every 6 (six) hours as needed. (Patient not taking: Reported on 07/30/2020), Disp: 30 tablet, Rfl: 0 .  nitroGLYCERIN (NITROSTAT) 0.4 MG SL tablet, Place 1 tablet (0.4 mg total) under the tongue every 5 (five) minutes as needed for chest pain. (Patient not taking: Reported on 07/30/2020), Disp: 25 tablet, Rfl: 3 .  rosuvastatin (CRESTOR) 40 MG tablet, Take 1 tablet (40 mg total) by mouth daily., Disp: 90 tablet, Rfl: 3   Objective:   Vitals:   07/30/20 1546  BP: (!) 143/90  Pulse: 82  SpO2: 96%    Physical Exam Vitals and nursing note reviewed.  Constitutional:      Appearance: Normal appearance.  HENT:     Head: Normocephalic and atraumatic.  Cardiovascular:     Rate and Rhythm: Normal rate.  Pulses: Normal pulses.  Pulmonary:     Effort: Pulmonary effort is normal.  Abdominal:     General: Abdomen is flat. There is no distension.  Skin:    General: Skin is warm.     Capillary Refill: Capillary refill takes less than 2 seconds.     Coloration: Skin is not jaundiced.     Findings: No bruising.  Neurological:     General: No focal deficit present.     Mental Status: She is alert and oriented to person, place, and time.  Psychiatric:        Mood and Affect: Mood normal.        Behavior: Behavior normal.     Assessment & Plan:  Malignant neoplasm of upper-outer quadrant of left breast in female, estrogen receptor positive ([place])  The options for reconstruction we explained to the patient / family for breast reconstruction.  There are two general categories of reconstruction.  We can reconstruction a breast with implants or use the patient's own tissue.  These were further discussed as listed.  Breast reconstruction is an optional procedure and eligibility depends on the full spectrum of the health of the patient and any co-morbidities.  More than one  surgery is often needed to complete the reconstruction process.  The process can take three to twelve months to complete.  The breasts will not be identical due to many factors such as rib differences, shoulder asymmetry and treatments such as radiation.  The goal is to get the breasts to look normal and symmetrical in clothes.  Scars are a part of surgery and may fade some in time but will always be present under clothes.  Surgery may be an option on the non-cancer breast to achieve more symmetry.  No matter which procedure is chosen there is always the risk of complications and even failure of the body to heal.  This could result in no breast.    The options for reconstruction include:  1. Placement of a tissue expander with Acellular dermal matrix. When the expander is the desired size surgery is performed to remove the expander and place an implant.  In some cases the implant can be placed without an expander.  2. Autologous reconstruction can include using a muscle or tissue from another area of the body to create a breast.  3. Combined procedures (ie. latissismus dorsi flap) can be done with an expander / implant placed under the muscle.   The risks, benefits, scars and recovery time were discussed for each of the above. Risks include bleeding, infection, hematoma, seroma, scarring, pain, wound healing complications, flap loss, fat necrosis, capsular contracture, need for implant removal, donor site complications, bulge, hernia, umbilical necrosis, need for urgent reoperation, and need for dressing changes.   The procedure the patient selected / that was best for the patient, was then discussed in further detail.  Total time: 50 minutes. This includes time spent with the patient during the visit as well as time spent before and after the visit reviewing the chart, documenting the encounter, making phone calls and reviewing studies.   Is most likely going to decide on bilateral breast mastectomies  with implant-based reconstruction.  This will start with an expander and Flex HD.  She would then have a exchange to an implant 3 months later.  There is a chance that her left side will not expand due to her previous radiation.  If that is the case and she still wants to go larger  she would need a latissimus muscle flap.  She is not interested in using her abdomen at this time.  One of her priorities is her granddaughter.  [place], DO

## 2020-07-31 ENCOUNTER — Telehealth: Payer: Self-pay

## 2020-07-31 NOTE — Telephone Encounter (Signed)
Faxed referral to [facility]

## 2020-08-06 ENCOUNTER — Encounter: Payer: Self-pay | Admitting: Plastic Surgery

## 2020-08-07 NOTE — Progress Notes (Deleted)
Cardiology Office Note    Date:  08/07/2020   ID:  Bethany [name], DOB [birth year], MRN [id number]   PCP:  Leeroy Cha, [place]  Cardiologist:  [name], MD *** Advanced Practice Provider:  No care team member to display Electrophysiologist:  None   [phone]   No chief complaint on file.   History of Present Illness:  Bethany [name] is a 60 y.o. female with history of CAD s/p DES to LAD 06/2019, breast cancer in 2019 s/p radiation therapy, hiatal hernia, thyroid cancer, hypothyroidism, HTN, former tobacco abuse and anxiety      Past Medical History:  Diagnosis Date  . ADD (attention deficit disorder)   . Anxiety   . B12 deficiency   . Breast cancer ([facility]) 2019   left breast cancer/diagnosed in 10/2017/taking radiation treatment until 03/15/18  . Breast mass, left 11/2017   going thru radiation until 02/2018  . CAD (coronary artery disease)    a. DES to LAD 06/2019.  Marland Kitchen Chest pain 06/19/2014  . COPD (chronic obstructive pulmonary disease) ([place])    beginning stages/small scar  . Dental crowns present   . Depression   . Hiatal hernia   . History of hiatal hernia    no current med.  Marland Kitchen History of thyroid cancer 12/15/2017  . Hypertension    states under control with med., has been on med. x 1 yr.  . Hypothyroidism   . Malignant neoplasm of upper-outer quadrant of left breast in female, estrogen receptor positive ([place])   . Mild hyperlipidemia   . 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 MVP (mitral valve prolapse)    not seen on echo 05/2019  . Personal history of radiation therapy 2019   Left Breast Cancer  . PONV (postoperative nausea and vomiting)   . Prediabetes   . 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 Syncope and collapse 12/25/2008   Qualifier: Diagnosis of  By: Philemon Kingdom    . Urinary incontinence    USI   . Vitamin D deficiency     Past Surgical History:  Procedure Laterality Date  . ABDOMINAL  HYSTERECTOMY     partial  . APPENDECTOMY    . AXILLARY SENTINEL NODE BIOPSY Left 12/09/2017   Procedure: AXILLARY SENTINEL NODE BIOPSY;  Surgeon: Erroll Luna, MD;  Location: [facility];  Service: General;  Laterality: Left;  . BREAST EXCISIONAL BIOPSY Right 09/20/2019  . BREAST LUMPECTOMY Left 11/2017  . BREAST LUMPECTOMY WITH NEEDLE LOCALIZATION Left 11/25/2017   Procedure: LEFT BREAST LUMPECTOMY WITH NEEDLE LOCALIZATION;  Surgeon: Erroll Luna, MD;  Location: [facility];  Service: General;  Laterality: Left;  . BREAST LUMPECTOMY WITH RADIOACTIVE SEED LOCALIZATION Right 09/20/2019   Procedure: RIGHT BREAST LUMPECTOMY WITH RADIOACTIVE SEED LOCALIZATION;  Surgeon: Erroll Luna, MD;  Location: [facility];  Service: General;  Laterality: Right;  . COLONOSCOPY WITH PROPOFOL  10/03/2015  . CORONARY STENT INTERVENTION N/A 06/29/2019   Procedure: CORONARY STENT INTERVENTION;  Surgeon: Burnell Blanks, MD;  Location: [facility];  Service: Cardiovascular;  Laterality: N/A;  . LAPAROSCOPIC APPENDECTOMY N/A 10/03/2015   Procedure: APPENDECTOMY LAPAROSCOPIC;  Surgeon: Rolm Bookbinder, MD;  Location: [facility];  Service: General;  Laterality: N/A;  . LEFT HEART CATH AND CORONARY ANGIOGRAPHY N/A 06/29/2019   Procedure: LEFT HEART CATH AND CORONARY ANGIOGRAPHY;  Surgeon: Burnell Blanks, MD;  Location: [facility];  Service: Cardiovascular;  Laterality: N/A;  . TOTAL THYROIDECTOMY  04/02/2003    Current Medications: No outpatient medications have been marked as taking for the 08/14/20 encounter (Appointment) with Imogene Burn, PA-C.     Allergies:   Oxycodone, Latex, Other, and Sulfa antibiotics   Social History   Socioeconomic History  . Marital status: Married    Spouse name: Patrick Jupiter  . Number of children: 3  . Years of education: Not on file  . Highest education level: Not on file  Occupational History  . Occupation:  stay at home spouse  Tobacco Use  . Smoking status: Former Smoker    Quit date: 08/14/2014    Years since quitting: 5.9  . Smokeless tobacco: Never Used  Vaping Use  . Vaping Use: Never used  Substance and Sexual Activity  . Alcohol use: Yes    Comment: rarely  . Drug use: No  . Sexual activity: Yes    Partners: Male    Birth control/protection: Surgical  Other Topics Concern  . Not on file  Social History Narrative  . Not on file   Social Determinants of Health   Financial Resource Strain: Not on file  Food Insecurity: Not on file  Transportation Needs: Not on file  Physical Activity: Not on file  Stress: Not on file  Social Connections: Not on file     Family History:  The patient's ***family history includes Alcoholism in her father and mother; Anxiety disorder in her father; Cancer in her father; Colon cancer in an other family member; Colon polyps in her mother; Depression in her father; Heart disease in her father and paternal grandfather; High Cholesterol in her mother; Hypertension in her brother and father; Non-Hodgkin's lymphoma in her father.   ROS:   Please see the history of present illness.    ROS All other systems reviewed and are negative.   PHYSICAL EXAM:   VS:  There were no vitals taken for this visit.  Physical Exam  GEN: Well nourished, well developed, in no acute distress  HEENT: normal  Neck: no JVD, carotid bruits, or masses Cardiac:RRR; no murmurs, rubs, or gallops  Respiratory:  clear to auscultation bilaterally, normal work of breathing GI: soft, nontender, nondistended, + BS Ext: without cyanosis, clubbing, or edema, Good distal pulses bilaterally MS: no deformity or atrophy  Skin: warm and dry, no rash Neuro:  Alert and Oriented x 3, Strength and sensation are intact Psych: euthymic mood, full affect  Wt Readings from Last 3 Encounters:  07/30/20 206 lb (93.4 kg)  06/12/20 207 lb 14.4 oz (94.3 kg)  02/28/20 198 lb (89.8 kg)       Studies/Labs Reviewed:   EKG:  EKG is*** ordered today.  The ekg ordered today demonstrates ***  Recent Labs: 10/16/2019: TSH 1.020 06/12/2020: ALT 24; BUN 16; Creatinine, Ser 0.94; Hemoglobin 13.0; Platelets 307; Potassium 3.8; Sodium 138   Lipid Panel    Component Value Date/Time   CHOL 191 10/16/2019 1246   TRIG 107 10/16/2019 1246   HDL 73 10/16/2019 1246   CHOLHDL 2.3 09/19/2019 1155   CHOLHDL 3.7 06/19/2014 2234   VLDL 18 06/19/2014 2234   LDLCALC 99 10/16/2019 1246    Additional studies/ records that were reviewed today include:  Coronary CTA 06/2019 CLINICAL DATA:  Chest pain   EXAM: Cardiac CTA   MEDICATIONS: Sub lingual nitro.  4mg    TECHNIQUE: The patient was scanned on a Siemens Force 027 slice scanner. Gantry rotation speed was 250 msecs. Collimation  was .6 mm. A 100 kV prospective scan was triggered in the ascending thoracic aorta at 140 HU's Full mA was used between 35% and 75% of the R-R interval. Average HR during the scan was 51 bpm. The 3D data set was interpreted on a dedicated work station using MPR, MIP and VRT modes. A total of 80cc of contrast was used.   FINDINGS: Non-cardiac: See separate report from [facility]. No significant findings on limited lung and soft tissue windows.   Calcium Score: Calcium noted in proximal and mid LAD and RCA   Coronary Arteries: Right dominant with no anomalies   LM: Normal   LAD: 25-49% calcified plaque in proximal vessel > 70% mixed plaque in mid LAD   D1: Small vessel   D2: Small vessel   Circumflex: Normal   OM1: Normal   OM2: Normal   RCA: 1-24% calcified plaque in proximal vessel distal vessel is tortuous   PDA: Normal   PLA: Normal   IMPRESSION: 1.  Calcium score 263 which is 98 th percentile for age/sex   2.   Normal aortic root 3.6 cm   3. CAD-RADS 4 likely > 70% hemodynamically significant stenosis in mid LAD Study sent for FFR CT   Jenkins Rouge      Electronically Signed   By: Jenkins Rouge M.D.   On: 06/20/2019 17:53   CLINICAL DATA:  CAD   EXAM: FFR CT   TECHNIQUE: The best systolic and diastolic phases of the patients gated cardiac CTA sent for hemodynamic analysis   FINDINGS: FFR CT positive in mid LAD 0.79 and 0.76 in distal LAD   IMPRESSION: FFR CT suggesting hemodynamically significant CAD in mid LAD Patient will be referred for heart catheterization   Jenkins Rouge     Electronically Signed   By: Jenkins Rouge M.D.   On: 06/21/2019 09:33   Do not see overread.   2D Echo 06/05/19 IMPRESSIONS       1. Left ventricular ejection fraction, by estimation, is 60 to 65%. The  left ventricle has normal function. The left ventricle has no regional  wall motion abnormalities. There is mild asymmetric left ventricular  hypertrophy. Left ventricular diastolic  parameters were normal.   2. Right ventricular systolic function is normal. The right ventricular  size is normal. There is mildly elevated pulmonary artery systolic  pressure.   3. The mitral valve is normal in structure. No evidence of mitral valve  regurgitation. No evidence of mitral stenosis.   4. The aortic valve is normal in structure. Aortic valve regurgitation is  not visualized. No aortic stenosis is present.    [facility]HC 06/29/19            Mid LAD lesion is 80% stenosed.            A drug-eluting stent was successfully placed using a STENT RESOLUTE ONYX 3.0X18.            Post intervention, there is a 0% residual stenosis.   1. Severe stenosis mid LAD 2. Successful PTCA/DES x 1 mid LAD   Recommendations: 3 months of DAPT with ASA and Plavix and then will stop for her breast surgery.     Risk Assessment/Calculations:   {Does this patient have ATRIAL FIBRILLATION?[phone]}     ASSESSMENT:    No diagnosis found.   PLAN:  In order of problems listed above:  CAD status post DES to the LAD 06/2019  Hypertension  Former tobacco  abuse History  of breast CA 2019 status post radiation   Shared Decision Making/Informed Consent   {Are you ordering a CV Procedure (e.g. stress test, cath, DCCV, TEE, etc)?   Press F2        :[id number]}    Medication Adjustments/Labs and Tests Ordered: Current medicines are reviewed at length with the patient today.  Concerns regarding medicines are outlined above.  Medication changes, Labs and Tests ordered today are listed in the Patient Instructions below. There are no Patient Instructions on file for this visit.   Sumner Boast, PA-C  08/07/2020 10:04 AM    [facility] [address], [place], [place]  [address] Phone: [phone]; Fax: [phone]

## 2020-08-14 ENCOUNTER — Ambulatory Visit: Payer: 59 | Admitting: Physician Assistant

## 2020-09-23 ENCOUNTER — Encounter (HOSPITAL_COMMUNITY): Payer: Self-pay

## 2020-09-23 ENCOUNTER — Encounter: Payer: Self-pay | Admitting: Oncology

## 2020-09-24 ENCOUNTER — Other Ambulatory Visit: Payer: Self-pay | Admitting: Oncology

## 2020-09-25 ENCOUNTER — Encounter: Payer: Self-pay | Admitting: Genetic Counselor

## 2020-09-25 ENCOUNTER — Other Ambulatory Visit: Payer: Self-pay

## 2020-09-25 ENCOUNTER — Inpatient Hospital Stay: Payer: 59 | Attending: Genetic Counselor | Admitting: Genetic Counselor

## 2020-09-25 ENCOUNTER — Inpatient Hospital Stay: Payer: 59

## 2020-09-25 DIAGNOSIS — K449 Diaphragmatic hernia without obstruction or gangrene: Secondary | ICD-10-CM | POA: Insufficient documentation

## 2020-09-25 DIAGNOSIS — E538 Deficiency of other specified B group vitamins: Secondary | ICD-10-CM | POA: Insufficient documentation

## 2020-09-25 DIAGNOSIS — Z85850 Personal history of malignant neoplasm of thyroid: Secondary | ICD-10-CM | POA: Diagnosis not present

## 2020-09-25 DIAGNOSIS — Z170 Estrogen receptor positive status [ER+]: Secondary | ICD-10-CM | POA: Insufficient documentation

## 2020-09-25 DIAGNOSIS — J449 Chronic obstructive pulmonary disease, unspecified: Secondary | ICD-10-CM | POA: Diagnosis not present

## 2020-09-25 DIAGNOSIS — C50412 Malignant neoplasm of upper-outer quadrant of left female breast: Secondary | ICD-10-CM | POA: Diagnosis present

## 2020-09-25 DIAGNOSIS — E039 Hypothyroidism, unspecified: Secondary | ICD-10-CM | POA: Insufficient documentation

## 2020-09-25 DIAGNOSIS — Z923 Personal history of irradiation: Secondary | ICD-10-CM | POA: Insufficient documentation

## 2020-09-25 DIAGNOSIS — Z79810 Long term (current) use of selective estrogen receptor modulators (SERMs): Secondary | ICD-10-CM | POA: Insufficient documentation

## 2020-09-25 DIAGNOSIS — Z79811 Long term (current) use of aromatase inhibitors: Secondary | ICD-10-CM | POA: Diagnosis not present

## 2020-09-25 DIAGNOSIS — E559 Vitamin D deficiency, unspecified: Secondary | ICD-10-CM | POA: Diagnosis not present

## 2020-09-25 DIAGNOSIS — I2510 Atherosclerotic heart disease of native coronary artery without angina pectoris: Secondary | ICD-10-CM | POA: Insufficient documentation

## 2020-09-25 DIAGNOSIS — I10 Essential (primary) hypertension: Secondary | ICD-10-CM | POA: Insufficient documentation

## 2020-09-25 LAB — COMPREHENSIVE METABOLIC PANEL
ALT: 22 U/L (ref 0–44)
AST: 19 U/L (ref 15–41)
Albumin: 3.8 g/dL (ref 3.5–5.0)
Alkaline Phosphatase: 83 U/L (ref 38–126)
Anion gap: 7 (ref 5–15)
BUN: 9 mg/dL (ref 6–20)
CO2: 28 mmol/L (ref 22–32)
Calcium: 9.4 mg/dL (ref 8.9–10.3)
Chloride: 104 mmol/L (ref 98–111)
Creatinine, Ser: 0.95 mg/dL (ref 0.44–1.00)
GFR, Estimated: >60 mL/min (ref >60)
Glucose, Bld: 91 mg/dL (ref 70–99)
Potassium: 4.3 mmol/L (ref 3.5–5.1)
Sodium: 139 mmol/L (ref 135–145)
Total Bilirubin: 0.4 mg/dL (ref 0.3–1.2)
Total Protein: 7.2 g/dL (ref 6.5–8.1)

## 2020-09-25 LAB — CBC WITH DIFFERENTIAL/PLATELET
Abs Immature Granulocytes: 0.01 10*3/uL (ref 0.00–0.07)
Basophils Absolute: 0.1 10*3/uL (ref 0.0–0.1)
Basophils Relative: 1 %
Eosinophils Absolute: 0.1 10*3/uL (ref 0.0–0.5)
Eosinophils Relative: 2 %
HCT: 40.8 % (ref 36.0–46.0)
Hemoglobin: 13.3 g/dL (ref 12.0–15.0)
Immature Granulocytes: 0 %
Lymphocytes Relative: 36 %
Lymphs Abs: 2 10*3/uL (ref 0.7–4.0)
MCH: 29.6 pg (ref 26.0–34.0)
MCHC: 32.6 g/dL (ref 30.0–36.0)
MCV: 90.9 fL (ref 80.0–100.0)
Monocytes Absolute: 0.4 10*3/uL (ref 0.1–1.0)
Monocytes Relative: 8 %
Neutro Abs: 2.9 10*3/uL (ref 1.7–7.7)
Neutrophils Relative %: 53 %
Platelets: 303 10*3/uL (ref 150–400)
RBC: 4.49 MIL/uL (ref 3.87–5.11)
RDW: 13.3 % (ref 11.5–15.5)
WBC: 5.5 10*3/uL (ref 4.0–10.5)
nRBC: 0 % (ref 0.0–0.2)

## 2020-09-25 LAB — GENETIC SCREENING ORDER

## 2020-09-25 NOTE — Progress Notes (Signed)
REFERRING PROVIDER: Chauncey Cruel, MD [address] [place],  [place] [address]  PRIMARY PROVIDER:  Leeroy Cha, MD  PRIMARY REASON FOR VISIT:  1. History of thyroid cancer   2. Malignant neoplasm of upper-outer quadrant of left breast in female, estrogen receptor positive ([place])      HISTORY OF PRESENT ILLNESS:   Bethany [name], a 60 y.o. female, was seen for a [facility] cancer genetics consultation at the request of Dr. Jana Hakim due to a personal and family history of cancer.  Bethany [name] presents to clinic today to discuss the possibility of a hereditary predisposition to cancer, genetic testing, and to further clarify her future cancer risks, as well as potential cancer risks for family members.   In 2005, at the age of 62, Bethany [name] was diagnosed with thyroid cancer.  Removal of the entire thyroid occurred after that diagnosis.   In 2019, a the age of 52, Bethany [name] was diagnosed with breast cancer.  This was treated with lumpectomy and radiation.  Within a year after her treatment, Bethany [name] was diagnosed with Pulmonary Fibrosis.   CANCER HISTORY:  Oncology History  Malignant neoplasm of upper-outer quadrant of left breast in female, estrogen receptor positive ([place])   Initial Diagnosis   Malignant neoplasm of upper-outer quadrant of left breast in female, estrogen receptor positive ([facility])   11/25/2017 Surgery   [place], [name] woman status post left breast upper outer quadrant lumpectomy 11/25/2017 for a pT1b pNX, stage Ia invasive ductal carcinoma, grade 1, estrogen and progesterone receptor positive, HER-2/neu negative, with an MIB-1 of 3%   11/25/2017 Oncotype testing   24/10%   12/09/2017 Surgery   status post left axillary lymph node sampling 12/09/2017, all 4 sentinel lymph nodes clear   01/27/2018 - 03/15/2018 Radiation Therapy   The patient initially received a dose of 50.4 Gy in 28 fractions to the left breast using whole-breast tangent fields. This  was delivered using a 3-D conformal technique. The patient then received a boost to the seroma. This delivered an additional 10 Gy in 5 fractions using 10X, 6X photons with a Complex Isodose technique. The total dose was 60.4 Gy.   04/2018 -  Anti-estrogen oral therapy   '20mg'$  Tamoxifen daily, decreased to $RemoveBefo'10mg'RYsBsvIRILc$  in 07/2018      RISK FACTORS:  Menarche was at age 71.  First live birth at age 67.  OCP use for approximately  20  years.  Ovaries intact: one ovary.  Hysterectomy: yes.  Menopausal status: postmenopausal.  HRT use:  2-3  years. Colonoscopy: yes;  multiple polyps . Mammogram within the last year: yes. Number of breast biopsies: 7. Up to date with pelvic exams: yes. Any excessive radiation exposure in the past: yes  Past Medical History:  Diagnosis Date   ADD (attention deficit disorder)    Anxiety    B12 deficiency    Breast cancer ([facility]) 2019   left breast cancer/diagnosed in 10/2017/taking radiation treatment until 03/15/18   Breast mass, left 11/2017   going thru radiation until 02/2018   CAD (coronary artery disease)    a. DES to LAD 06/2019.   Chest pain 06/19/2014   COPD (chronic obstructive pulmonary disease) (HCC)    beginning stages/small scar   Dental crowns present    Depression    Hiatal hernia    History of hiatal hernia    no current med.   History of thyroid cancer 12/15/2017   Hypertension    states under control with  med., has been on med. x 1 yr.   Hypothyroidism    Malignant neoplasm of upper-outer quadrant of left breast in female, estrogen receptor positive ([facility])    Mild hyperlipidemia    Mucocele of appendix 10/03/2015   MVP (mitral valve prolapse)    not seen on echo 05/2019   Personal history of radiation therapy 2019   Left Breast Cancer   PONV (postoperative nausea and vomiting)    Prediabetes    Radiation fibrosis of lung ([place])    Syncope and collapse 12/25/2008   Qualifier: Diagnosis of  By: Philemon Kingdom     Urinary  incontinence    USI    Vitamin D deficiency     Past Surgical History:  Procedure Laterality Date   ABDOMINAL HYSTERECTOMY     partial   APPENDECTOMY     AXILLARY SENTINEL NODE BIOPSY Left 12/09/2017   Procedure: AXILLARY SENTINEL NODE BIOPSY;  Surgeon: Erroll Luna, MD;  Location: [facility];  Service: General;  Laterality: Left;   BREAST EXCISIONAL BIOPSY Right 09/20/2019   BREAST LUMPECTOMY Left 11/2017   BREAST LUMPECTOMY WITH NEEDLE LOCALIZATION Left 11/25/2017   Procedure: LEFT BREAST LUMPECTOMY WITH NEEDLE LOCALIZATION;  Surgeon: Erroll Luna, MD;  Location: [facility];  Service: General;  Laterality: Left;   BREAST LUMPECTOMY WITH RADIOACTIVE SEED LOCALIZATION Right 09/20/2019   Procedure: RIGHT BREAST LUMPECTOMY WITH RADIOACTIVE SEED LOCALIZATION;  Surgeon: Erroll Luna, MD;  Location: [facility];  Service: General;  Laterality: Right;   COLONOSCOPY WITH PROPOFOL  10/03/2015   CORONARY STENT INTERVENTION N/A 06/29/2019   Procedure: CORONARY STENT INTERVENTION;  Surgeon: Burnell Blanks, MD;  Location: [facility];  Service: Cardiovascular;  Laterality: N/A;   LAPAROSCOPIC APPENDECTOMY N/A 10/03/2015   Procedure: APPENDECTOMY LAPAROSCOPIC;  Surgeon: Rolm Bookbinder, MD;  Location: [facility];  Service: General;  Laterality: N/A;   LEFT HEART CATH AND CORONARY ANGIOGRAPHY N/A 06/29/2019   Procedure: LEFT HEART CATH AND CORONARY ANGIOGRAPHY;  Surgeon: Burnell Blanks, MD;  Location: [facility];  Service: Cardiovascular;  Laterality: N/A;   TOTAL THYROIDECTOMY  04/02/2003    Social History   Socioeconomic History   Marital status: Married    Spouse name: Patrick Jupiter   Number of children: 3   Years of education: Not on file   Highest education level: Not on file  Occupational History   Occupation: stay at home spouse  Tobacco Use   Smoking status: Former    Pack years: 0.00    Types: Cigarettes    Quit  date: 08/14/2014    Years since quitting: 6.1   Smokeless tobacco: Never  Vaping Use   Vaping Use: Never used  Substance and Sexual Activity   Alcohol use: Yes    Comment: rarely   Drug use: No   Sexual activity: Yes    Partners: Male    Birth control/protection: Surgical  Other Topics Concern   Not on file  Social History Narrative   Not on file   Social Determinants of Health   Financial Resource Strain: Not on file  Food Insecurity: Not on file  Transportation Needs: Not on file  Physical Activity: Not on file  Stress: Not on file  Social Connections: Not on file     FAMILY HISTORY:  We obtained a detailed, 4-generation family history.  Significant diagnoses are listed below: Family History  Problem Relation Age of Onset   Colon polyps Mother  High Cholesterol Mother    Alcoholism Mother    Hypertension Father    Heart disease Father    Non-Hodgkin's lymphoma Father 68   Cancer Father    Depression Father    Anxiety disorder Father    Alcoholism Father    Hypertension Brother    Lung cancer Paternal Aunt        d. 52   Throat cancer Maternal Grandfather        d. 80   Throat cancer Paternal Grandmother        dx < 61   Heart disease Paternal Grandfather    Colon cancer Other        great maternal aunt    Stomach cancer Neg Hx    Rectal cancer Neg Hx    Esophageal cancer Neg Hx     The patient has three sons who are cancer free.  She has one brother who is cancer free.  Her father is deceased and her mother is living.  The patient's father died of NHL.  He had one sister who had lung cancer and was a non-smoker.  Her son died of thyroid disease.  The paternal grandparents are deceased.  The grandmother had throat cancer and died of a stroke at 27 and the grandfather died of heart disease.  The patient's mother is living at 15.  She has three sisters and a brother who are cancer free.  Her parents are deceased.  The grandfather died of throat cancer and  the grandmother died of old age at 27.  Bethany [name] is unaware of previous family history of genetic testing for hereditary cancer risks. Patient's maternal ancestors are of [place] and Jewish descent, and paternal ancestors are of [place] and [place] descent. There is reported Ashkenazi Jewish ancestry. There is no known consanguinity.  GENETIC COUNSELING ASSESSMENT: Bethany [name] is a 60 y.o. female with a personal and family history of cancer which is somewhat suggestive of a hereditary cancer syndrome and predisposition to cancer given her diagnosis of breast cancer and her Jewish ancestry. We, therefore, discussed and recommended the following at today's visit.   DISCUSSION: We discussed that 5 - 10% of breast cancer is hereditary, with most cases associated with BRCA mutations.  In the [place] population, there are three BRCA mutations that are most common and are thought to make up 90% of patients with Jewish ancestry who have BRCA mutations.  There are other genes that can be associated with hereditary breast cancer syndromes.  These include ATM, CHEK2 and PALB2.  We discussed that testing is beneficial for several reasons including knowing how to follow individuals after completing their treatment and understand if other family members could be at risk for cancer and allow them to undergo genetic testing.   We reviewed the characteristics, features and inheritance patterns of hereditary cancer syndromes. We also discussed genetic testing, including the appropriate family members to test, the process of testing, insurance coverage and turn-around-time for results. We discussed the implications of a negative, positive, carrier and/or variant of uncertain significant result. We recommended Bethany [name] pursue genetic testing for the Multi-cancer+RNA gene panel. The Multi-Gene Panel offered by [facility] includes sequencing and/or deletion duplication testing of the following 84 genes: AIP, ALK, APC, ATM,  AXIN2,BAP1,  BARD1, BLM, BMPR1A, BRCA1, BRCA2, BRIP1, CASR, CDC73, CDH1, CDK4, CDKN1B, CDKN1C, CDKN2A (p14ARF), CDKN2A (p16INK4a), CEBPA, CHEK2, CTNNA1, DICER1, DIS3L2, EGFR (c.2369C>T, p.Thr790Met variant only), EPCAM (Deletion/duplication testing only), FH, FLCN, GATA2, GPC3, GREM1 (Promoter region  deletion/duplication testing only), HOXB13 (c.251G>A, p.Gly84Glu), HRAS, KIT, MAX, MEN1, MET, MITF (c.952G>A, p.Glu318Lys variant only), MLH1, MSH2, MSH3, MSH6, MUTYH, NBN, NF1, NF2, NTHL1, PALB2, PDGFRA, PHOX2B, PMS2, POLD1, POLE, POT1, PRKAR1A, PTCH1, PTEN, RAD50, RAD51C, RAD51D, RB1, RECQL4, RET, RUNX1, SDHAF2, SDHA (sequence changes only), SDHB, SDHC, SDHD, SMAD4, SMARCA4, SMARCB1, SMARCE1, STK11, SUFU, TERC, TERT, TMEM127, TP53, TSC1, TSC2, VHL, WRN and WT1.    Based on Bethany [name]'s personal and family history of cancer, she meets medical criteria for genetic testing. Despite that she meets criteria, she may still have an out of pocket cost. We discussed that if her out of pocket cost for testing is over $100, the laboratory will call and confirm whether she wants to proceed with testing.  If the out of pocket cost of testing is less than $100 she will be billed by the genetic testing laboratory.   PLAN: After considering the risks, benefits, and limitations, Bethany [name] provided informed consent to pursue genetic testing and the blood sample was [facility] to [facility] for analysis of the Multi-cancer gene panel + RNA. Results should be available within approximately 2-3 weeks' time, at which point they will be disclosed by telephone to Bethany [name], as will any additional recommendations warranted by these results. Bethany [name] will receive a summary of her genetic counseling visit and a copy of her results once available. This information will also be available in Epic.   Lastly, we encouraged Bethany [name] to remain in contact with cancer genetics annually so that we can continuously update the family  history and inform her of any changes in cancer genetics and testing that may be of benefit for this family.   Bethany [name]'s questions were answered to her satisfaction today. Our contact information was provided should additional questions or concerns arise. Thank you for the referral and allowing [place] to share in the care of your patient.   Veer Elamin P. Florene Glen, [place], [facility] Licensed, Insurance risk surveyor [email or web address] phone: [phone]  The patient was seen for a total of 40 minutes in face-to-face genetic counseling.  The patient was seen alone.  This patient was discussed with Drs. Magrinat, Lindi Adie and/or Burr Medico who agrees with the above.    _______________________________________________________________________ For Office Staff:  Number of people involved in session: 1 Was an Intern/ student involved with case: yes Cristie Hem

## 2020-10-06 ENCOUNTER — Other Ambulatory Visit: Payer: Self-pay | Admitting: Physician Assistant

## 2020-10-06 ENCOUNTER — Other Ambulatory Visit: Payer: Self-pay | Admitting: Oncology

## 2020-10-07 ENCOUNTER — Ambulatory Visit: Payer: 59 | Admitting: Orthopaedic Surgery

## 2020-10-10 ENCOUNTER — Telehealth: Payer: Self-pay | Admitting: Genetic Counselor

## 2020-10-10 NOTE — Telephone Encounter (Signed)
Left Good news message on VM.  Asked that she please call back for details.

## 2020-10-11 ENCOUNTER — Ambulatory Visit: Payer: Self-pay | Admitting: Genetic Counselor

## 2020-10-11 ENCOUNTER — Telehealth: Payer: Self-pay | Admitting: Genetic Counselor

## 2020-10-11 DIAGNOSIS — Z1379 Encounter for other screening for genetic and chromosomal anomalies: Secondary | ICD-10-CM | POA: Insufficient documentation

## 2020-10-11 NOTE — Telephone Encounter (Signed)
Revealed negative genetic testing.  Discussed that we do not know why she has breast cancer or why there is cancer in the family. It could be due to a different gene that we are not testing, or maybe our current technology may not be able to pick something up.  It will be important for her to keep in contact with genetics to keep up with whether additional testing may be needed.  One VUS identified.  No medical management changes are recommended.

## 2020-10-11 NOTE — Progress Notes (Signed)
HPI:  Bethany [name] was previously seen in the [facility] clinic due to a personal and family history of cancer and concerns regarding a hereditary predisposition to cancer. Please refer to our prior cancer genetics clinic note for more information regarding our discussion, assessment and recommendations, at the time. Bethany [name]'s recent genetic test results were disclosed to her, as were recommendations warranted by these results. These results and recommendations are discussed in more detail below.  CANCER HISTORY:  Oncology History  Malignant neoplasm of upper-outer quadrant of left breast in female, estrogen receptor positive ([place])   Initial Diagnosis   Malignant neoplasm of upper-outer quadrant of left breast in female, estrogen receptor positive ([place])   11/25/2017 Surgery   [place], [place] woman status post left breast upper outer quadrant lumpectomy 11/25/2017 for a pT1b pNX, stage Ia invasive ductal carcinoma, grade 1, estrogen and progesterone receptor positive, HER-2/neu negative, with an MIB-1 of 3%   11/25/2017 Oncotype testing   24/10%   12/09/2017 Surgery   status post left axillary lymph node sampling 12/09/2017, all 4 sentinel lymph nodes clear   01/27/2018 - 03/15/2018 Radiation Therapy   The patient initially received a dose of 50.4 Gy in 28 fractions to the left breast using whole-breast tangent fields. This was delivered using a 3-D conformal technique. The patient then received a boost to the seroma. This delivered an additional 10 Gy in 5 fractions using 10X, 6X photons with a Complex Isodose technique. The total dose was 60.4 Gy.   04/2018 -  Anti-estrogen oral therapy   '20mg'$  Tamoxifen daily, decreased to $RemoveBefo'10mg'[id number]$  in 07/2018   10/10/2020 Genetic Testing   Normal genetic testing on the multi-cancer panel.  RECQL4 c.2273G>A (p.Arg758Gln) VUS identified.  The report date is October 10, 2020.  The Multi-Gene Panel offered by [facility] includes sequencing and/or deletion  duplication testing of the following 84 genes: AIP, ALK, APC, ATM, AXIN2,BAP1,  BARD1, BLM, BMPR1A, BRCA1, BRCA2, BRIP1, CASR, CDC73, CDH1, CDK4, CDKN1B, CDKN1C, CDKN2A (p14ARF), CDKN2A (p16INK4a), CEBPA, CHEK2, CTNNA1, DICER1, DIS3L2, EGFR (c.2369C>T, p.Thr790Met variant only), EPCAM (Deletion/duplication testing only), FH, FLCN, GATA2, GPC3, GREM1 (Promoter region deletion/duplication testing only), HOXB13 (c.251G>A, p.Gly84Glu), HRAS, KIT, MAX, MEN1, MET, MITF (c.952G>A, p.Glu318Lys variant only), MLH1, MSH2, MSH3, MSH6, MUTYH, NBN, NF1, NF2, NTHL1, PALB2, PDGFRA, PHOX2B, PMS2, POLD1, POLE, POT1, PRKAR1A, PTCH1, PTEN, RAD50, RAD51C, RAD51D, RB1, RECQL4, RET, RUNX1, SDHAF2, SDHA (sequence changes only), SDHB, SDHC, SDHD, SMAD4, SMARCA4, SMARCB1, SMARCE1, STK11, SUFU, TERC, TERT, TMEM127, TP53, TSC1, TSC2, VHL, WRN and WT1.       FAMILY HISTORY:  We obtained a detailed, 4-generation family history.  Significant diagnoses are listed below: Family History  Problem Relation Age of Onset   Colon polyps Mother    High Cholesterol Mother    Alcoholism Mother    Hypertension Father    Heart disease Father    Non-Hodgkin's lymphoma Father 9   Cancer Father    Depression Father    Anxiety disorder Father    Alcoholism Father    Hypertension Brother    Lung cancer Paternal Aunt        d. 41   Throat cancer Maternal Grandfather        d. 53   Throat cancer Paternal Grandmother        dx < 62   Heart disease Paternal Grandfather    Colon cancer Other        great maternal aunt    Stomach cancer Neg Hx  Rectal cancer Neg Hx    Esophageal cancer Neg Hx     The patient has three sons who are cancer free.  She has one brother who is cancer free.  Her father is deceased and her mother is living.   The patient's father died of NHL.  He had one sister who had lung cancer and was a non-smoker.  Her son died of thyroid disease.  The paternal grandparents are deceased.  The grandmother had throat  cancer and died of a stroke at 58 and the grandfather died of heart disease.   The patient's mother is living at 40.  She has three sisters and a brother who are cancer free.  Her parents are deceased.  The grandfather died of throat cancer and the grandmother died of old age at 25.   Bethany [name] is unaware of previous family history of genetic testing for hereditary cancer risks. Patient's maternal ancestors are of [place] and Jewish descent, and paternal ancestors are of [place] and [place] descent. There is reported Ashkenazi Jewish ancestry. There is no known consanguinity.  GENETIC TEST RESULTS: Genetic testing reported out on October 10, 2020 through the multi-cancer panel found no pathogenic mutations. The Multi-Gene Panel offered by [facility] includes sequencing and/or deletion duplication testing of the following 84 genes: AIP, ALK, APC, ATM, AXIN2,BAP1,  BARD1, BLM, BMPR1A, BRCA1, BRCA2, BRIP1, CASR, CDC73, CDH1, CDK4, CDKN1B, CDKN1C, CDKN2A (p14ARF), CDKN2A (p16INK4a), CEBPA, CHEK2, CTNNA1, DICER1, DIS3L2, EGFR (c.2369C>T, p.Thr790Met variant only), EPCAM (Deletion/duplication testing only), FH, FLCN, GATA2, GPC3, GREM1 (Promoter region deletion/duplication testing only), HOXB13 (c.251G>A, p.Gly84Glu), HRAS, KIT, MAX, MEN1, MET, MITF (c.952G>A, p.Glu318Lys variant only), MLH1, MSH2, MSH3, MSH6, MUTYH, NBN, NF1, NF2, NTHL1, PALB2, PDGFRA, PHOX2B, PMS2, POLD1, POLE, POT1, PRKAR1A, PTCH1, PTEN, RAD50, RAD51C, RAD51D, RB1, RECQL4, RET, RUNX1, SDHAF2, SDHA (sequence changes only), SDHB, SDHC, SDHD, SMAD4, SMARCA4, SMARCB1, SMARCE1, STK11, SUFU, TERC, TERT, TMEM127, TP53, TSC1, TSC2, VHL, WRN and WT1. The test report has been scanned into EPIC and is located under the Molecular Pathology section of the Results Review tab.  A portion of the result report is included below for reference.     We discussed with Bethany [name] that because current genetic testing is not perfect, it is possible there may be a gene  mutation in one of these genes that current testing cannot detect, but that chance is small.  We also discussed, that there could be another gene that has not yet been discovered, or that we have not yet tested, that is responsible for the cancer diagnoses in the family. It is also possible there is a hereditary cause for the cancer in the family that Bethany [name] did not inherit and therefore was not identified in her testing.  Therefore, it is important to remain in touch with cancer genetics in the future so that we can continue to offer Bethany [name] the most up to date genetic testing.   Genetic testing did identify a variant of uncertain significance (VUS) was identified in the RECQL4 gene called c.2273G>A (p.Arg758Gln).  At this time, it is unknown if this variant is associated with increased cancer risk or if this is a normal finding, but most variants such as this get reclassified to being inconsequential. It should not be used to make medical management decisions. With time, we suspect the lab will determine the significance of this variant, if any. If we do learn more about it, we will try to contact Bethany [name] to discuss it further. However, it  is important to stay in touch with [place] periodically and keep the address and phone number up to date.  ADDITIONAL GENETIC TESTING: We discussed with Bethany [name] that her genetic testing was fairly extensive.  If there are genes identified to increase cancer risk that can be analyzed in the future, we would be happy to discuss and coordinate this testing at that time.    CANCER SCREENING RECOMMENDATIONS: Bethany [name]'s test result is considered negative (normal).  This means that we have not identified a hereditary cause for her personal and family history of cancer at this time. Most cancers happen by chance and this negative test suggests that her cancer may fall into this category.    While reassuring, this does not definitively rule out a hereditary  predisposition to cancer. It is still possible that there could be genetic mutations that are undetectable by current technology. There could be genetic mutations in genes that have not been tested or identified to increase cancer risk.  Therefore, it is recommended she continue to follow the cancer management and screening guidelines provided by her oncology and primary healthcare provider.   An individual's cancer risk and medical management are not determined by genetic test results alone. Overall cancer risk assessment incorporates additional factors, including personal medical history, family history, and any available genetic information that may result in a personalized plan for cancer prevention and surveillance  RECOMMENDATIONS FOR FAMILY MEMBERS:  Individuals in this family might be at some increased risk of developing cancer, over the general population risk, simply due to the family history of cancer.  We recommended women in this family have a yearly mammogram beginning at age 61, or 32 years younger than the earliest onset of cancer, an annual clinical breast exam, and perform monthly breast self-exams. Women in this family should also have a gynecological exam as recommended by their primary provider. All family members should be referred for colonoscopy starting at age 69.  FOLLOW-UP: Lastly, we discussed with Bethany [name] that cancer genetics is a rapidly advancing field and it is possible that new genetic tests will be appropriate for her and/or her family members in the future. We encouraged her to remain in contact with cancer genetics on an annual basis so we can update her personal and family histories and let her know of advances in cancer genetics that may benefit this family.   Our contact number was provided. Bethany [name]'s questions were answered to her satisfaction, and she knows she is welcome to call us at anytime with additional questions or concerns.   Roma Kayser, [place],  [facility] Licensed, Certified Genetic Counselor [email or web address]

## 2020-10-30 ENCOUNTER — Encounter: Payer: Self-pay | Admitting: Plastic Surgery

## 2020-10-30 ENCOUNTER — Ambulatory Visit: Payer: 59 | Admitting: Orthopaedic Surgery

## 2020-10-31 ENCOUNTER — Ambulatory Visit: Payer: 59 | Admitting: Cardiology

## 2020-11-20 ENCOUNTER — Ambulatory Visit: Payer: 59 | Admitting: Orthopaedic Surgery

## 2020-12-02 ENCOUNTER — Other Ambulatory Visit: Payer: Self-pay | Admitting: Oncology

## 2020-12-02 DIAGNOSIS — Z9889 Other specified postprocedural states: Secondary | ICD-10-CM

## 2020-12-03 ENCOUNTER — Ambulatory Visit (INDEPENDENT_AMBULATORY_CARE_PROVIDER_SITE_OTHER): Payer: 59 | Admitting: Plastic Surgery

## 2020-12-03 ENCOUNTER — Other Ambulatory Visit: Payer: Self-pay

## 2020-12-03 ENCOUNTER — Encounter: Payer: Self-pay | Admitting: Plastic Surgery

## 2020-12-03 DIAGNOSIS — N6489 Other specified disorders of breast: Secondary | ICD-10-CM | POA: Insufficient documentation

## 2020-12-03 DIAGNOSIS — Z170 Estrogen receptor positive status [ER+]: Secondary | ICD-10-CM

## 2020-12-03 DIAGNOSIS — C50412 Malignant neoplasm of upper-outer quadrant of left female breast: Secondary | ICD-10-CM

## 2020-12-03 NOTE — Progress Notes (Signed)
   Subjective:    Patient ID: Bethany [name], female    DOB: 1960-08-03, 60 y.o.   MRN: [id number]  The patient is a 60 year old female here for further evaluation of her breasts.  She had a partial mastectomy in 2019 of the left breast for treatment of invasive ductal carcinoma.  It was estrogen and progesterone positive and HER2 negative.  She had postprocedure radiation and chemotherapy on the left breast.  She had a right breast partial mastectomy but it was negative for cancer.  She has decided to not have mastectomies.  She has a little bit of asymmetry with the right breast being larger.  She also has some postradiation changes to the left breast with some skin changes as well.  She has a history of COPD, hypertension, thyroid disease, depression, hiatal hernia, prediabetes and quit smoking in 2016.  She is now interested in symmetry surgery.  She states that is difficult to fit into 1 bra because of the size difference between the 2 breasts.     Review of Systems  Constitutional: Negative.   Eyes: Negative.   Respiratory: Negative.    Cardiovascular: Negative.   Gastrointestinal: Negative.   Endocrine: Negative.   Genitourinary: Negative.   Musculoskeletal: Negative.   Skin: Negative.   Hematological: Negative.   Psychiatric/Behavioral: Negative.        Objective:   Physical Exam Vitals and nursing note reviewed.  Constitutional:      Appearance: Normal appearance.  Cardiovascular:     Rate and Rhythm: Normal rate.     Pulses: Normal pulses.  Pulmonary:     Effort: Pulmonary effort is normal. No respiratory distress.  Abdominal:     General: Abdomen is flat.  Skin:    Capillary Refill: Capillary refill takes less than 2 seconds.     Coloration: Skin is not jaundiced.     Findings: No bruising.  Neurological:     General: No focal deficit present.     Mental Status: She is alert.  Psychiatric:        Mood and Affect: Mood normal.        Behavior: Behavior normal.         Thought Content: Thought content normal.      Assessment & Plan:     ICD-10-CM   1. Malignant neoplasm of upper-outer quadrant of left breast in female, estrogen receptor positive ([place])  C50.412    Z17.0     2. Postoperative breast asymmetry  N64.89       The patient is a good candidate for fat grafting of the left breast.  She does not want to do a mastopexy on the right breast.  I think this is reasonable given that she wants to keep her current size.  I discussed the risks and complications because of her radiation.  And that it may take more than 1 procedure to get her to her desired symmetry.  Pictures were obtained of the patient and placed in the chart with the patient's or guardian's permission.

## 2020-12-05 ENCOUNTER — Other Ambulatory Visit: Payer: Self-pay | Admitting: Oncology

## 2020-12-05 DIAGNOSIS — Z853 Personal history of malignant neoplasm of breast: Secondary | ICD-10-CM

## 2020-12-10 ENCOUNTER — Other Ambulatory Visit: Payer: Self-pay | Admitting: Oncology

## 2020-12-10 ENCOUNTER — Ambulatory Visit
Admission: RE | Admit: 2020-12-10 | Discharge: 2020-12-10 | Disposition: A | Discharge status: Home / Self Care | Payer: 59 | Source: Ambulatory Visit | Attending: Oncology | Admitting: Oncology

## 2020-12-10 ENCOUNTER — Other Ambulatory Visit: Payer: Self-pay

## 2020-12-10 DIAGNOSIS — Z853 Personal history of malignant neoplasm of breast: Secondary | ICD-10-CM

## 2020-12-10 IMAGING — US US BREAST*R* LIMITED INC AXILLA
2 series · 13 of 18 positions shown · non-contrast
Comparison: Previous exam(s).

CLINICAL DATA: 51-year-old female status post malignant left breast
lumpectomy in [80] and excision of a right breast papilloma in [80].

EXAM:
DIGITAL DIAGNOSTIC BILATERAL MAMMOGRAM WITH TOMOSYNTHESIS AND CAD;
ULTRASOUND RIGHT BREAST LIMITED
TECHNIQUE: Bilateral digital diagnostic mammography and breast tomosynthesis
was performed. The images were evaluated with computer-aided
detection.; Targeted ultrasound examination of the right breast was
performed

[Series 1: us breast*right* limited inc axilla · 0.06mm/px · 10 of 14 slices shown (1 of 2)]
[im 1/14]
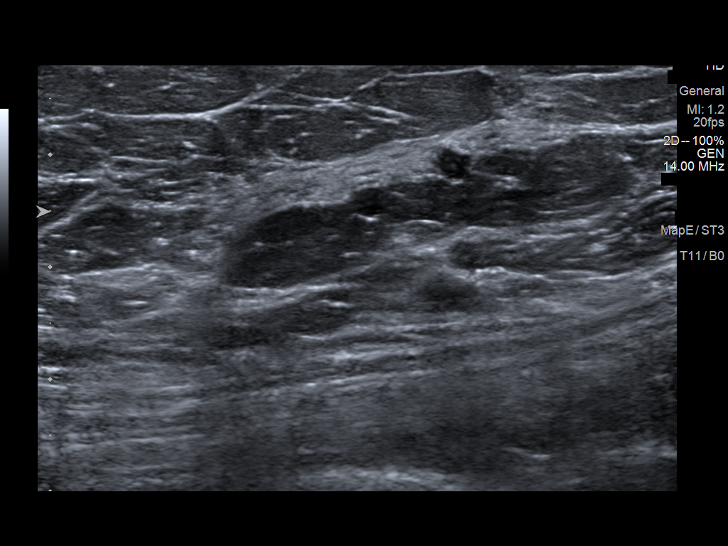
[im 3/14]
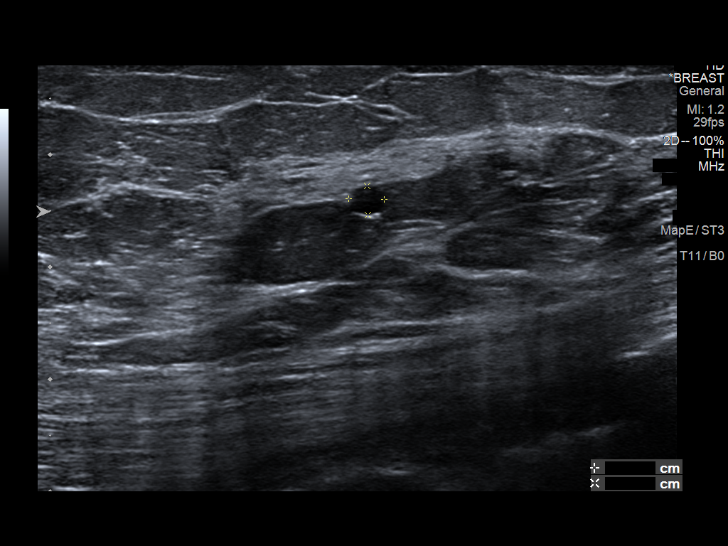
[im 4/14]
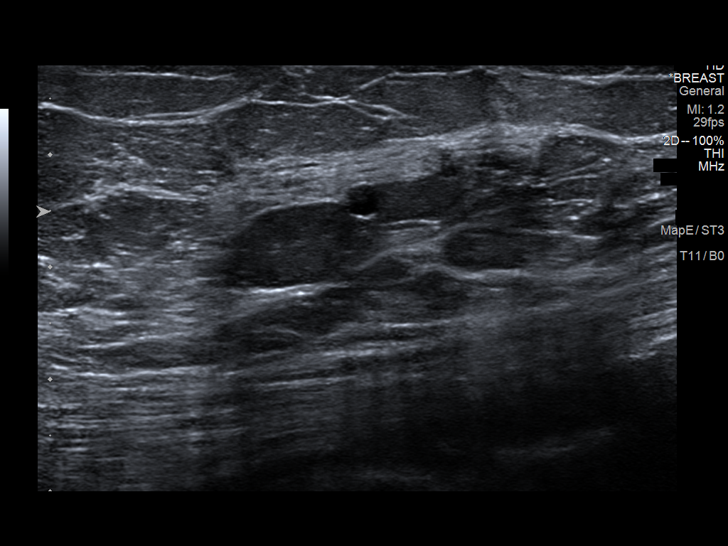
[im 5/14]
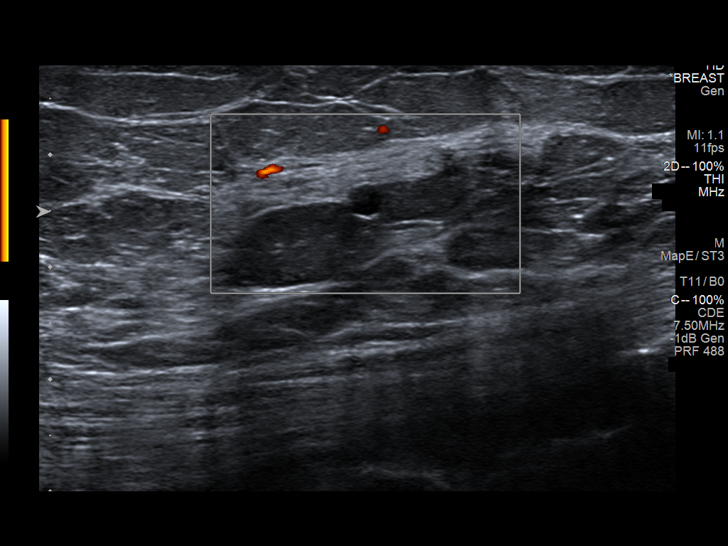
[im 7/14]
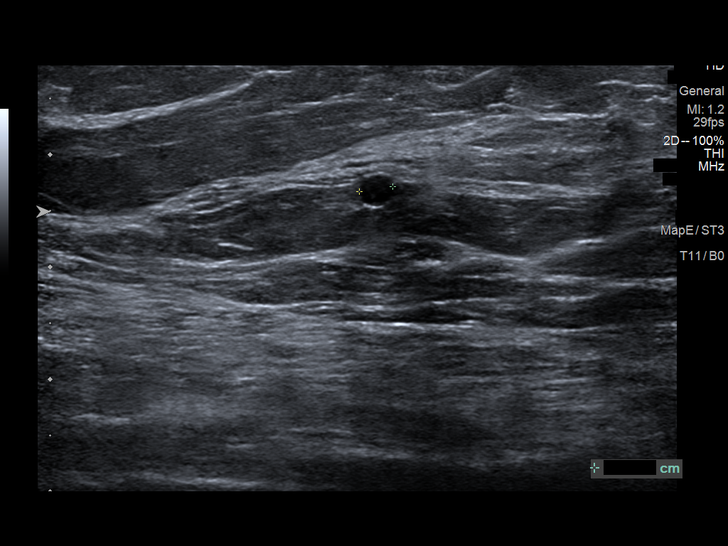
[im 8/14]
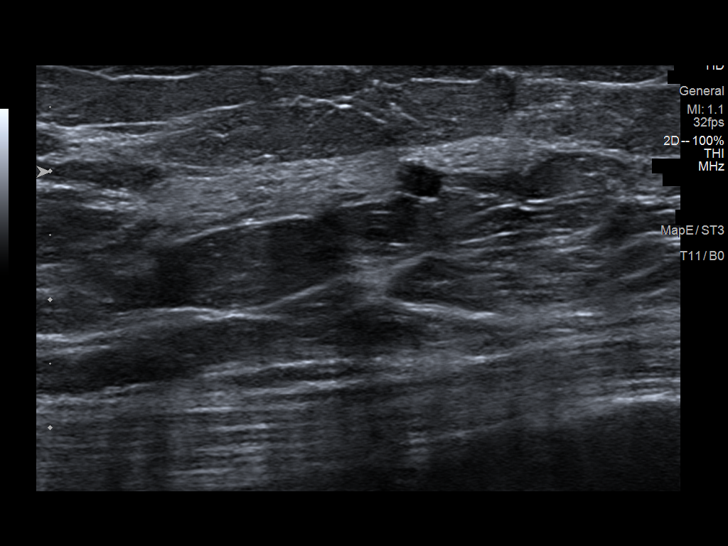
[im 10/14]
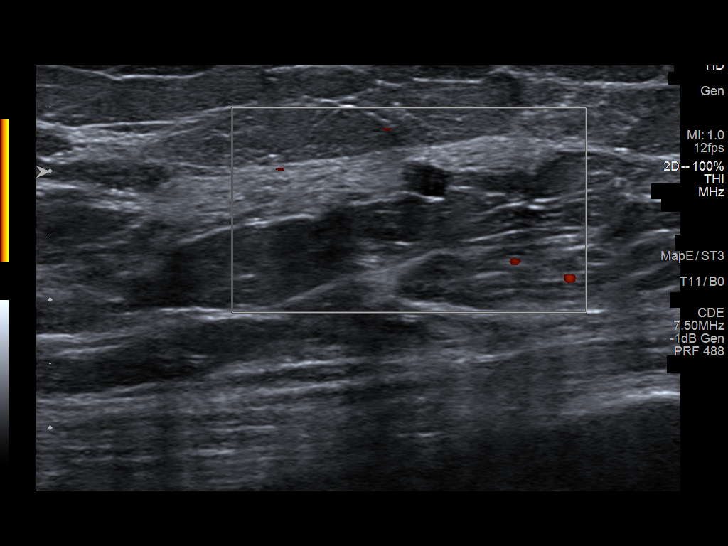
[im 11/14]
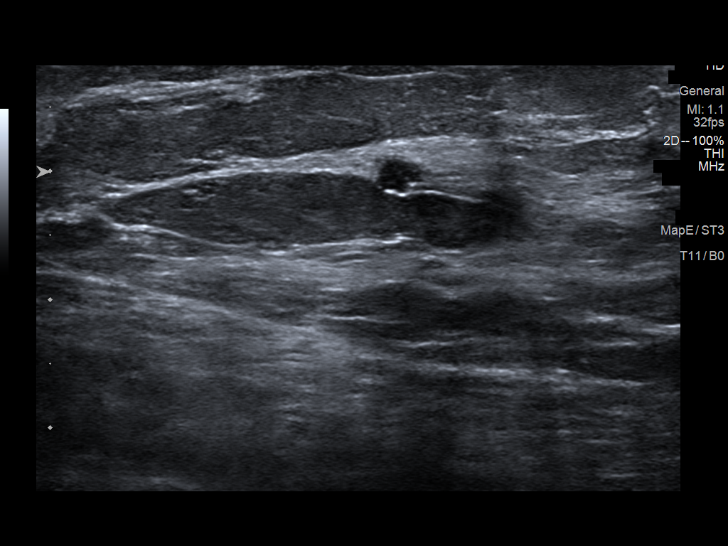
[im 12/14]
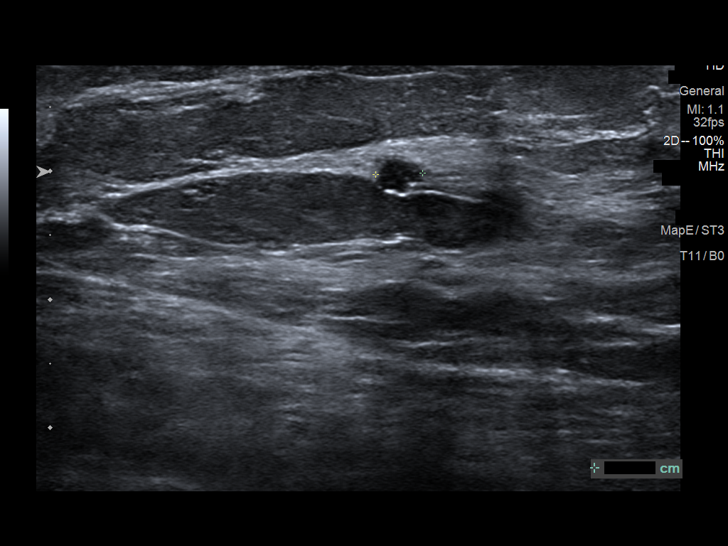
[im 14/14]
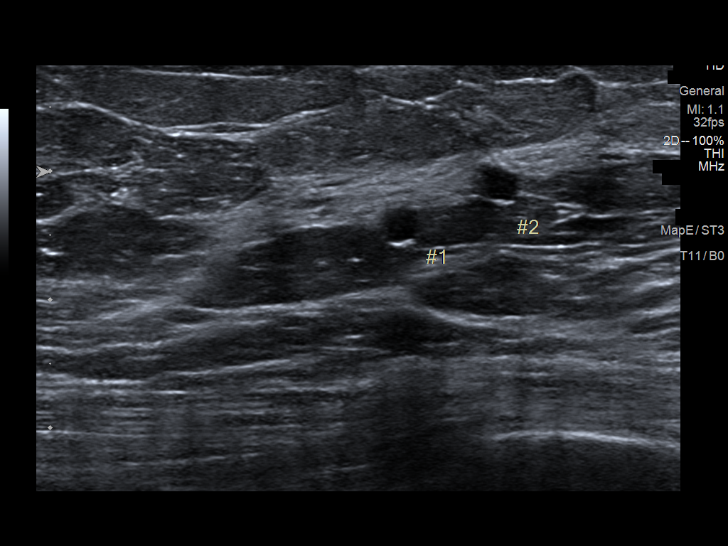

[Series 3: us breast*right* limited inc axilla · 0.07mm/px · 3 of 4 slices shown (2 of 2)]
[im 1/4]
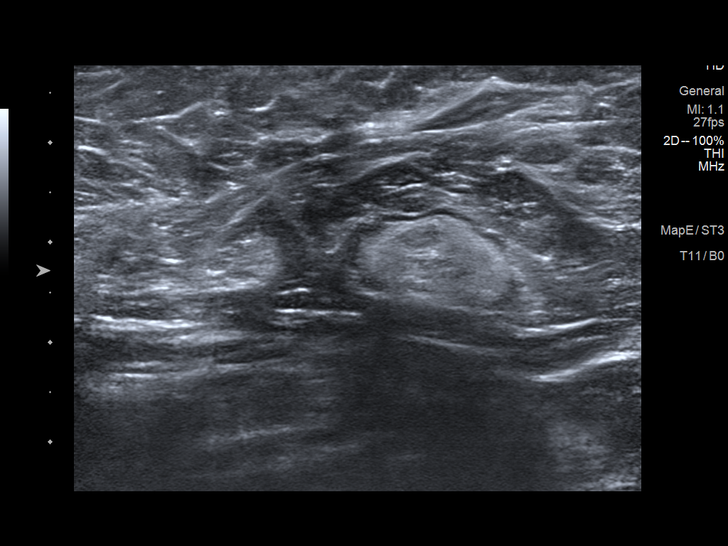
[im 2/4]
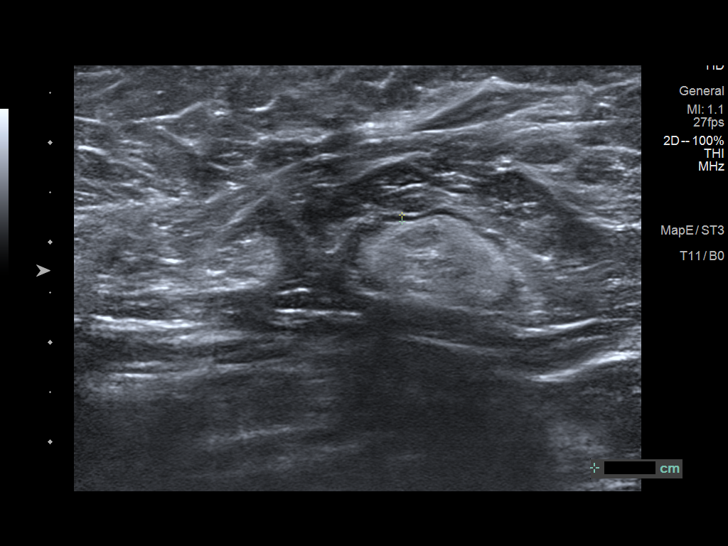
[im 4/4]
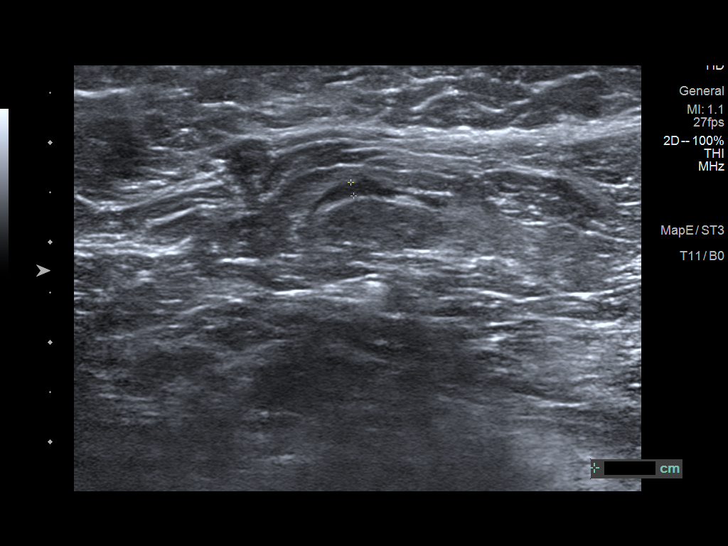

[13 of 18 positions shown; findings below may reference images not displayed]

ACR Breast Density Category b: There are scattered areas of
fibroglandular density.
FINDINGS: There is been interval development of a round, circumscribed equal
density mass in the lateral right breast at mid depth. Otherwise,
stable postsurgical changes are noted bilaterally.

Targeted ultrasound is performed, showing 2 oval, circumscribed
hypoechoic masses at the 9 o'clock position 4 cm from the nipple.
The measures 3 x 3 x 3 mm and 3 x 2 x 4 mm. One of these likely
corresponds with the mammographically identified mass. Evaluation of
the right axilla demonstrates no suspicious lymphadenopathy.
IMPRESSION: 1. Two indeterminate right breast masses, at the 9 o'clock position
4 cm from the nipple. One of these likely corresponds with a
mammographically identified mass. Recommendation is for
ultrasound-guided biopsy.
2. No suspicious right axillary lymphadenopathy.
3. Left breast posttreatment changes without mammographic evidence
of malignancy.

RECOMMENDATION:
Ultrasound-guided biopsy of 2 nearby masses at the 9 o'clock
position of the right breast.

I have discussed the findings and recommendations with the patient.
If applicable, a reminder letter will be sent to the patient
regarding the next appointment.

BI-RADS CATEGORY  4: Suspicious.

## 2020-12-10 IMAGING — MG DIGITAL DIAGNOSTIC BILAT W/ TOMO W/ CAD
6 of 9 series · 6 of 25 positions shown · non-contrast
Comparison: Previous exam(s).

CLINICAL DATA: 51-year-old female status post malignant left breast
lumpectomy in [80] and excision of a right breast papilloma in [80].

EXAM:
DIGITAL DIAGNOSTIC BILATERAL MAMMOGRAM WITH TOMOSYNTHESIS AND CAD;
ULTRASOUND RIGHT BREAST LIMITED
TECHNIQUE: Bilateral digital diagnostic mammography and breast tomosynthesis
was performed. The images were evaluated with computer-aided
detection.; Targeted ultrasound examination of the right breast was
performed

[L MLO]
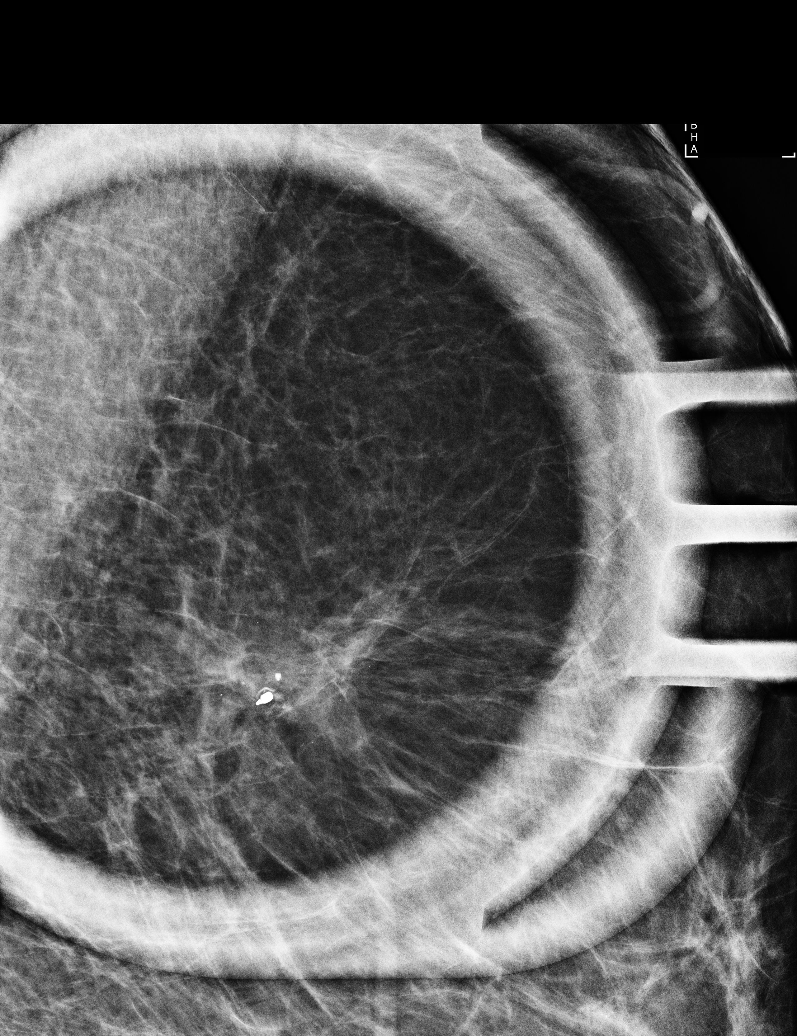

[R CC synth-2D]
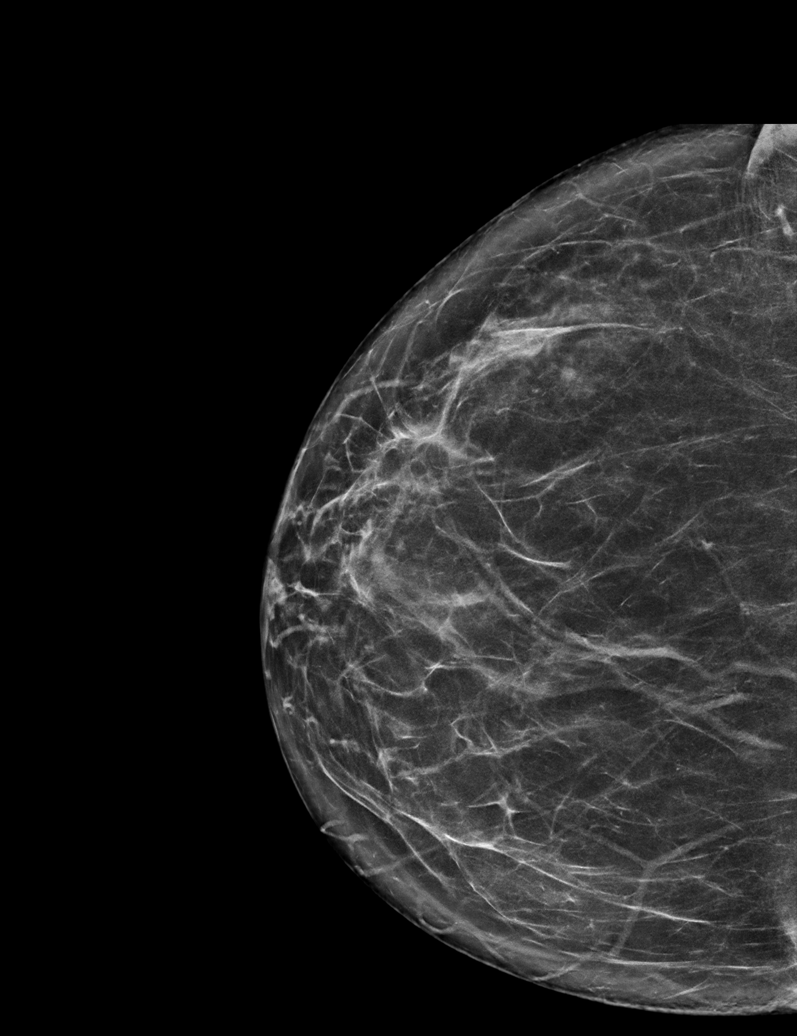

[L MLO synth-2D]
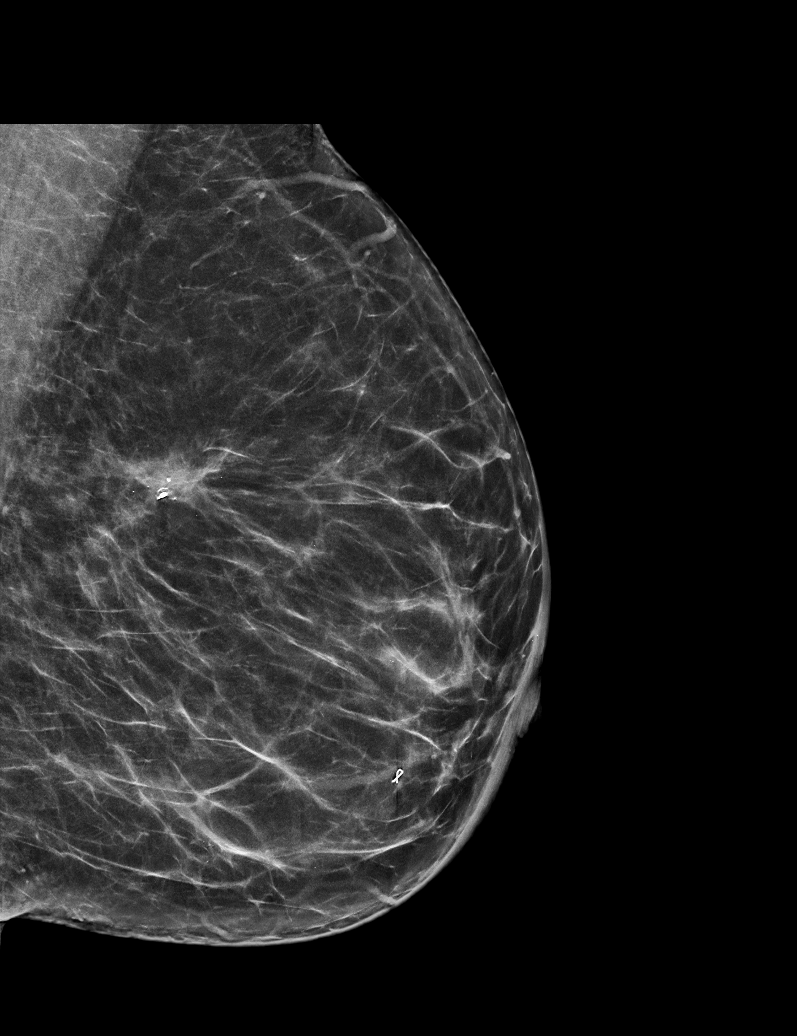

[L CC synth-2D]
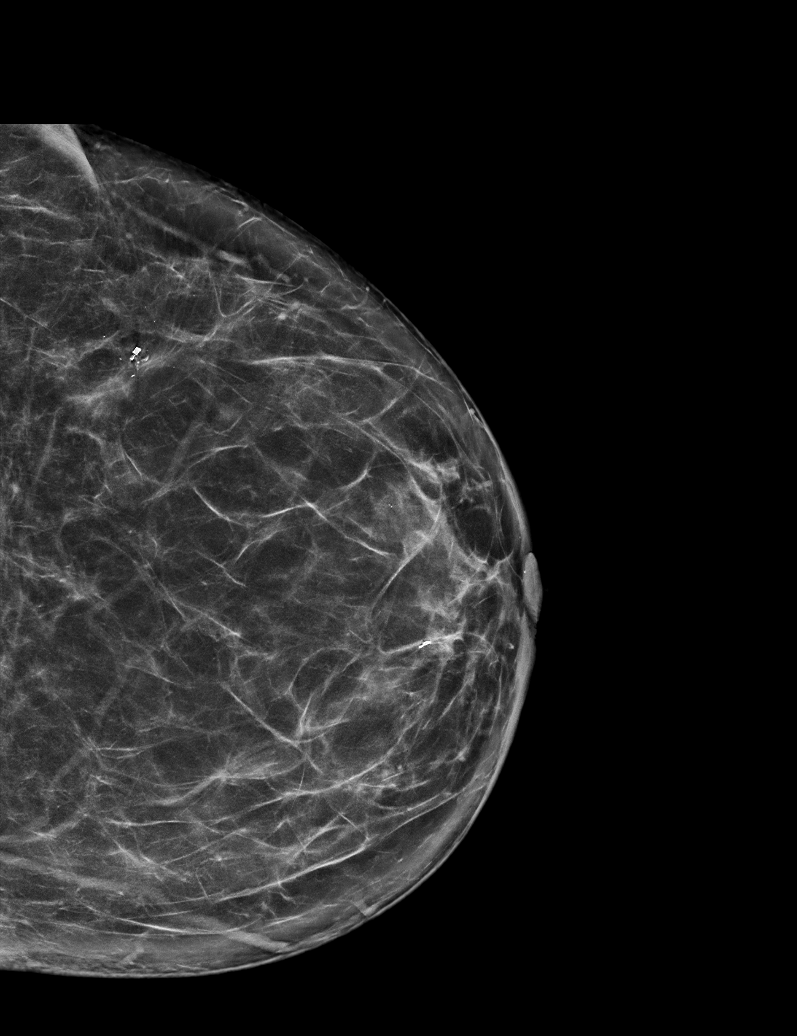

[R MLO synth-2D]
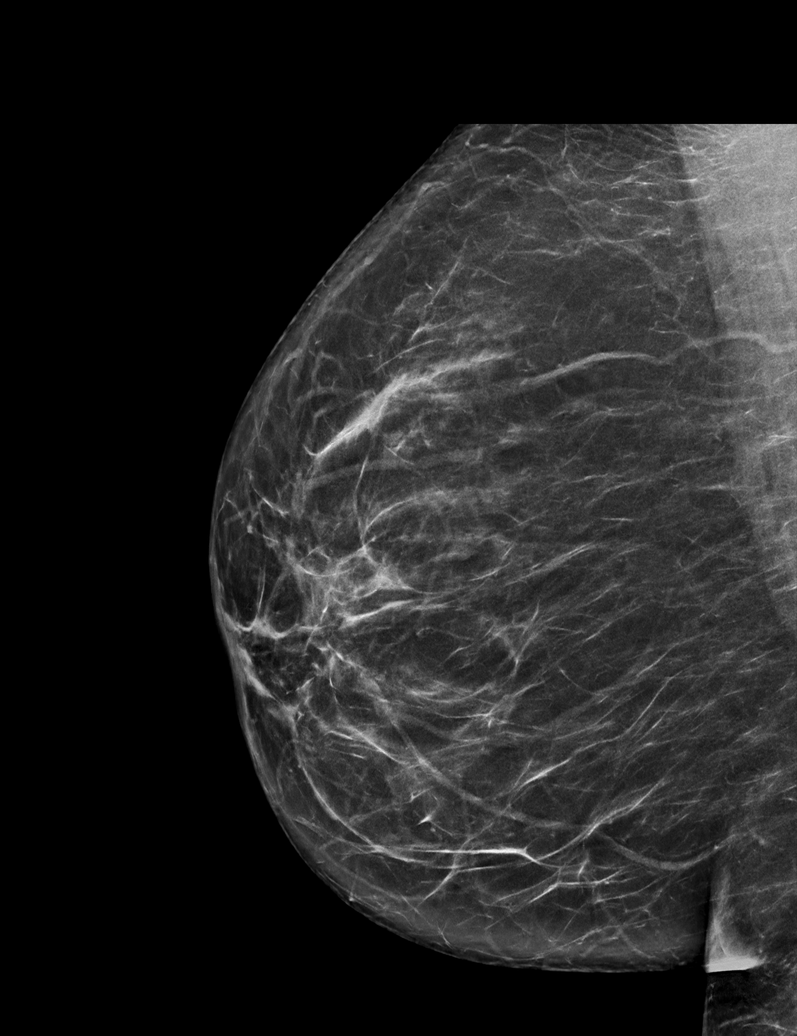

[L MLO tomo · tomo slice 35/68.0]
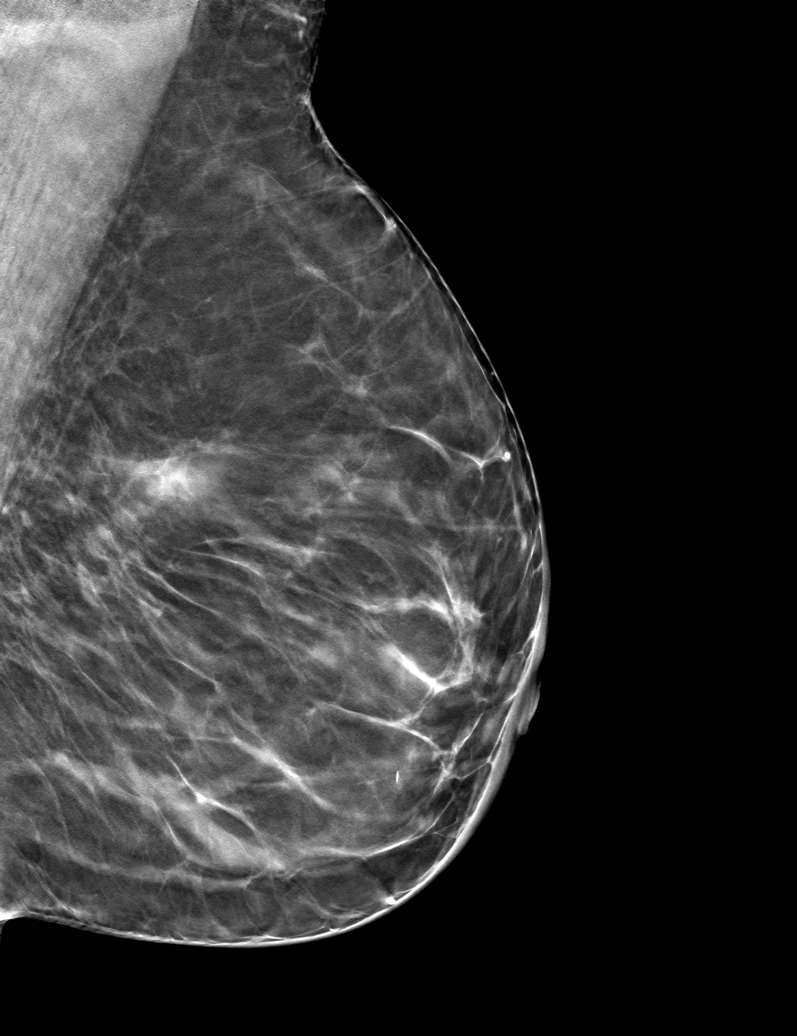

[6 of 25 positions shown; findings below may reference images not displayed]

ACR Breast Density Category b: There are scattered areas of
fibroglandular density.
FINDINGS: There is been interval development of a round, circumscribed equal
density mass in the lateral right breast at mid depth. Otherwise,
stable postsurgical changes are noted bilaterally.

Targeted ultrasound is performed, showing 2 oval, circumscribed
hypoechoic masses at the 9 o'clock position 4 cm from the nipple.
The measures 3 x 3 x 3 mm and 3 x 2 x 4 mm. One of these likely
corresponds with the mammographically identified mass. Evaluation of
the right axilla demonstrates no suspicious lymphadenopathy.
IMPRESSION: 1. Two indeterminate right breast masses, at the 9 o'clock position
4 cm from the nipple. One of these likely corresponds with a
mammographically identified mass. Recommendation is for
ultrasound-guided biopsy.
2. No suspicious right axillary lymphadenopathy.
3. Left breast posttreatment changes without mammographic evidence
of malignancy.

RECOMMENDATION:
Ultrasound-guided biopsy of 2 nearby masses at the 9 o'clock
position of the right breast.

I have discussed the findings and recommendations with the patient.
If applicable, a reminder letter will be sent to the patient
regarding the next appointment.

BI-RADS CATEGORY  4: Suspicious.

## 2020-12-11 ENCOUNTER — Other Ambulatory Visit: Payer: Self-pay | Admitting: Cardiovascular Disease

## 2020-12-18 ENCOUNTER — Ambulatory Visit
Admission: RE | Admit: 2020-12-18 | Discharge: 2020-12-18 | Disposition: A | Discharge status: Home / Self Care | Payer: 59 | Source: Ambulatory Visit | Attending: Oncology | Admitting: Oncology

## 2020-12-18 ENCOUNTER — Other Ambulatory Visit: Payer: Self-pay | Admitting: Oncology

## 2020-12-18 ENCOUNTER — Other Ambulatory Visit: Payer: Self-pay

## 2020-12-18 DIAGNOSIS — Z853 Personal history of malignant neoplasm of breast: Secondary | ICD-10-CM

## 2020-12-18 IMAGING — MG MM BREAST LOCALIZATION CLIP
4 series · 4 of 12 positions shown · non-contrast
Comparison: Previous exam(s).

CLINICAL DATA: Status post ultrasound-guided biopsies of 2 masses
in the outer RIGHT breast.

EXAM:
3D DIAGNOSTIC RIGHT MAMMOGRAM POST ULTRASOUND BIOPSY x2

[R ML synth-2D]
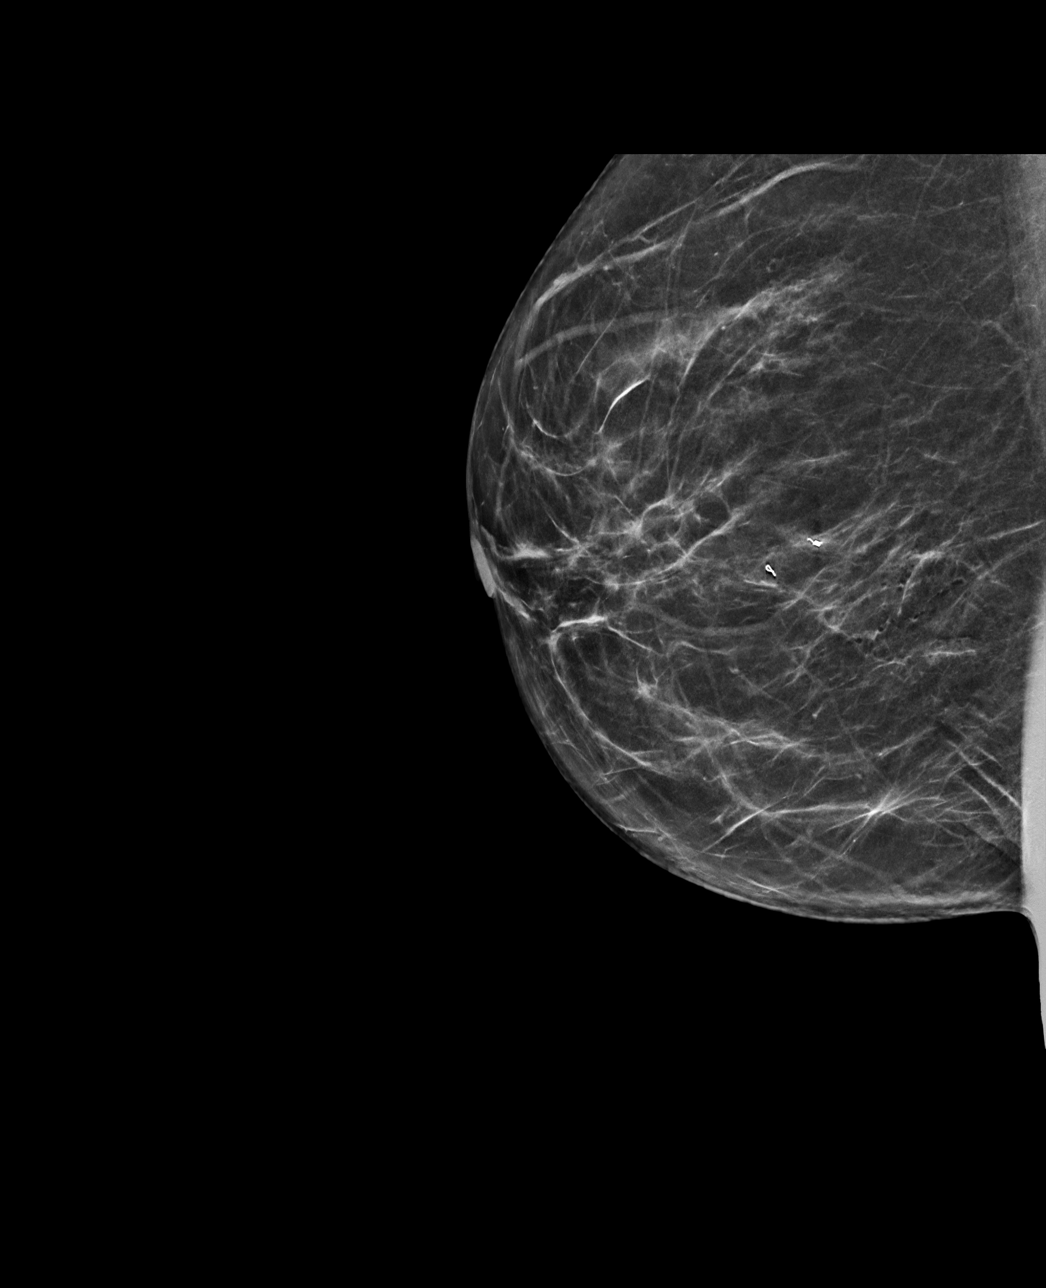

[R CC synth-2D]
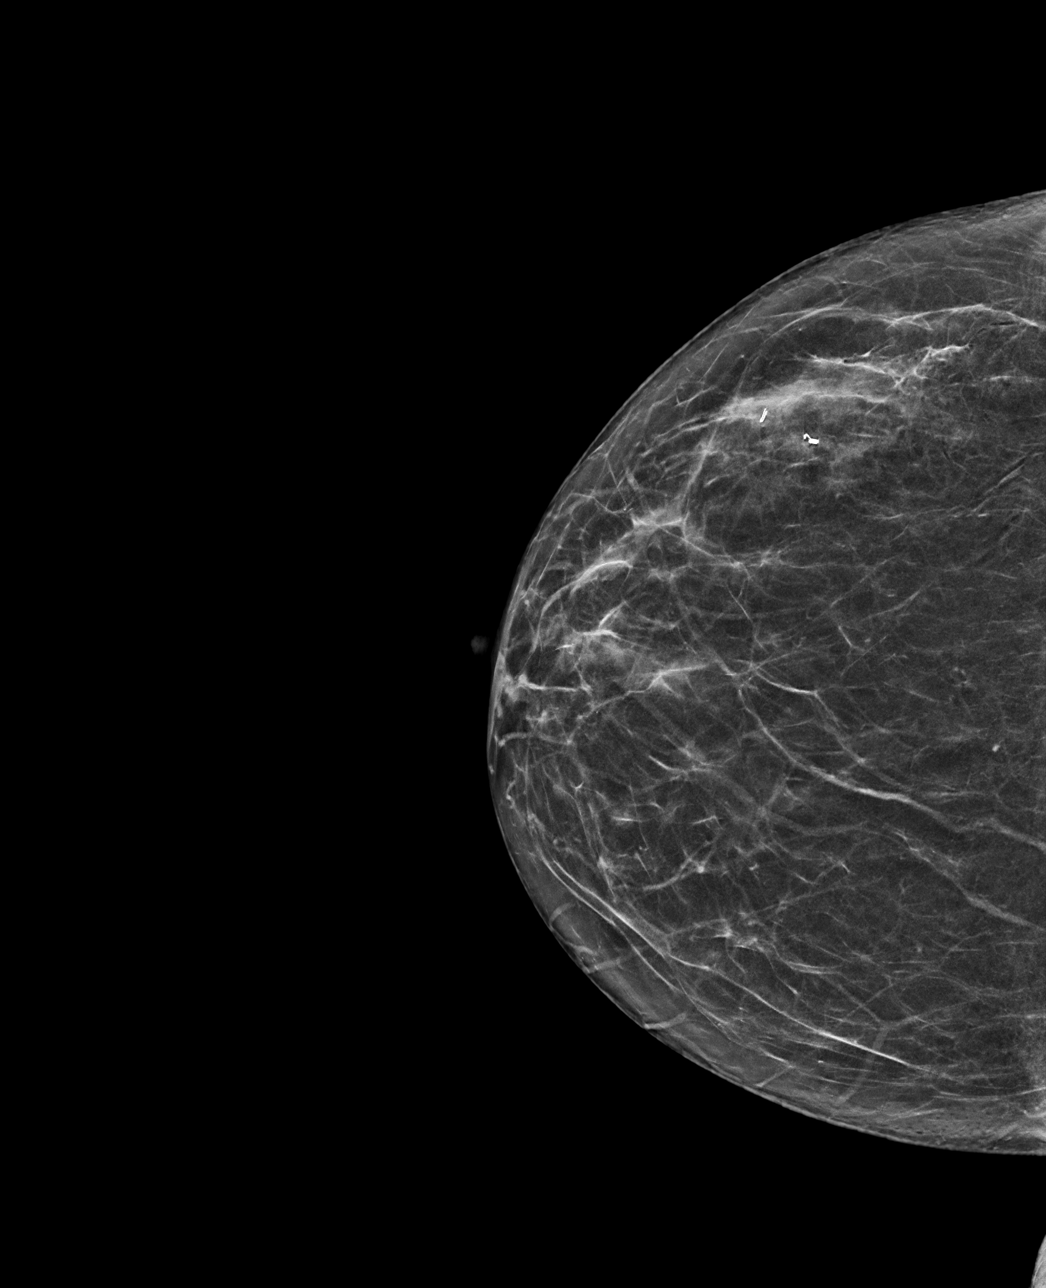

[R CC tomo · tomo slice 33/65.0]
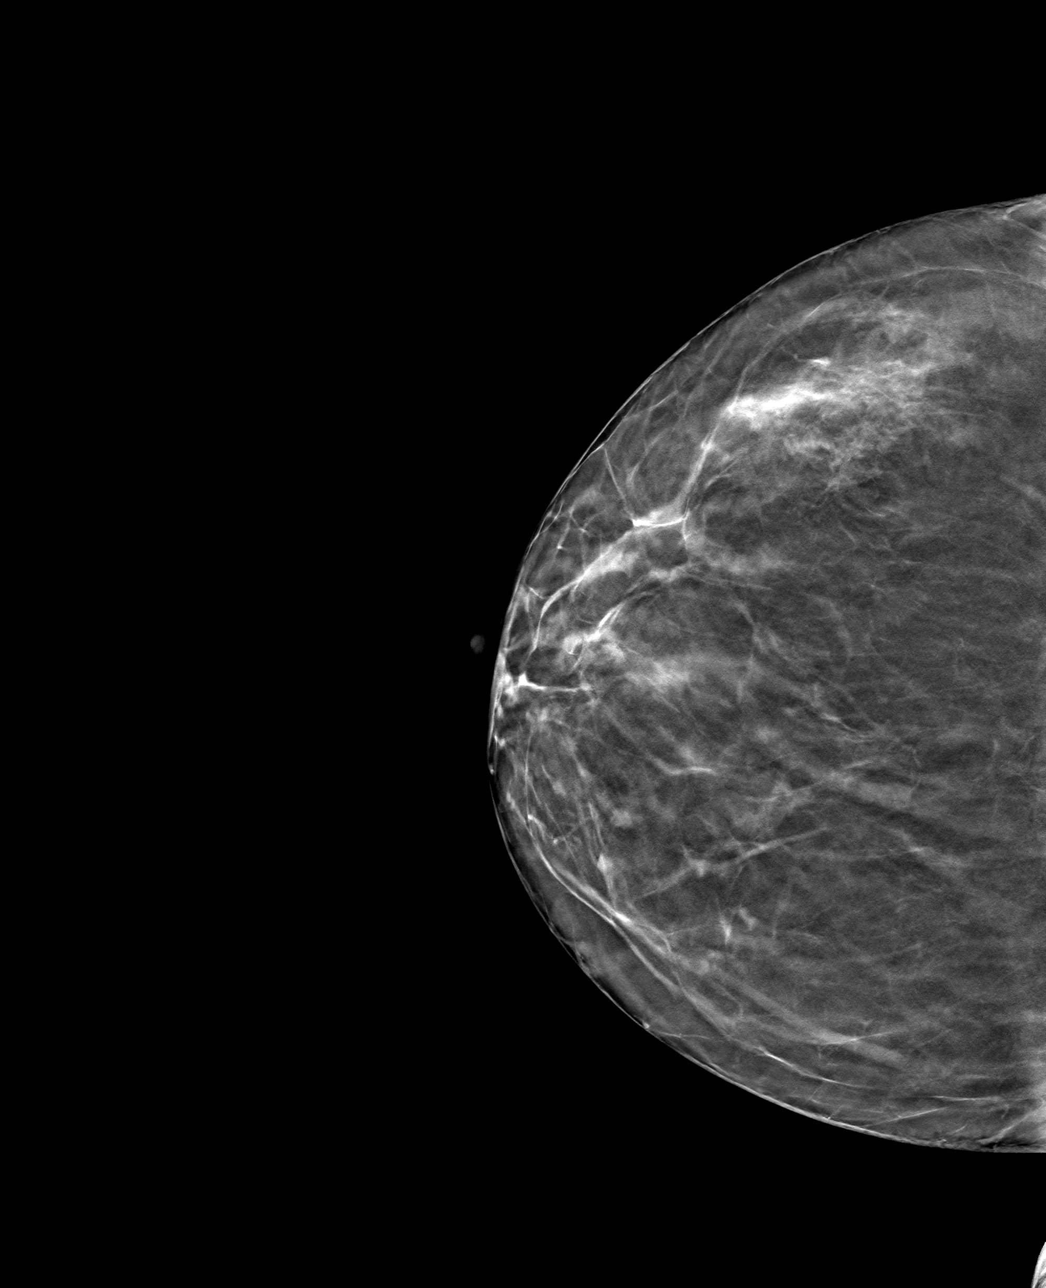

[R ML tomo · tomo slice 33/66.0]
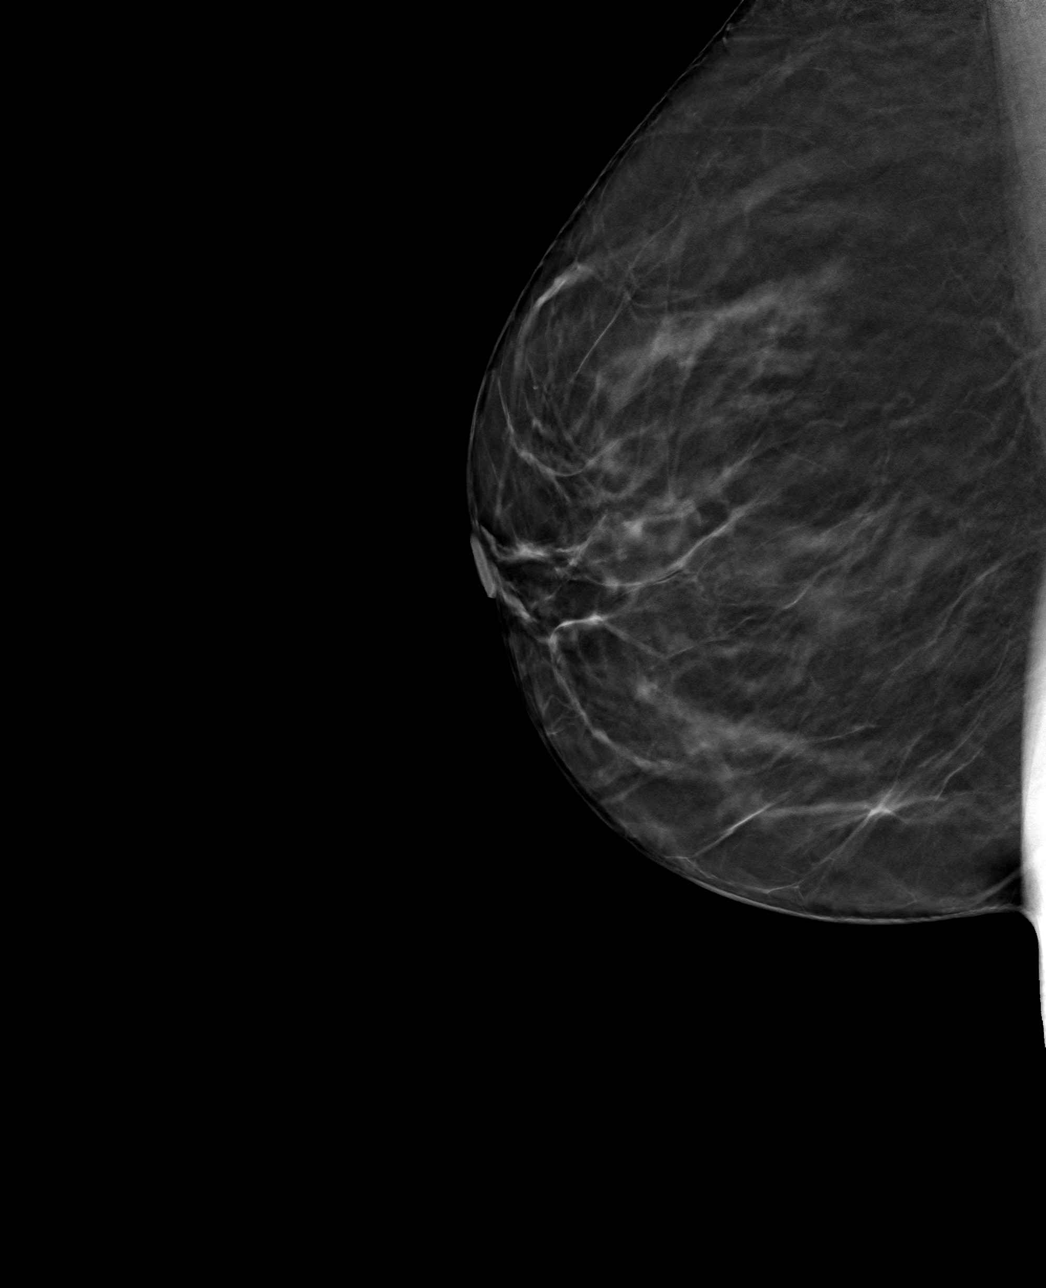

[4 of 12 positions shown; findings below may reference images not displayed]

FINDINGS: 3D Mammographic images were obtained following ultrasound guided
biopsy of 2 adjacent masses within the RIGHT breast at the 9 o'clock
axis. The 2 biopsy marking clips are in expected position at the
site of biopsy.
IMPRESSION: 1. Appropriate positioning of the ribbon shaped biopsy marking clip
at the site of biopsy in the RIGHT breast at the 9 o'clock axis
(site 1).
2. Appropriate positioning of the coil shaped biopsy marking clip at
the site of biopsy in the RIGHT breast at the 9 o'clock axis (site
2).

Final Assessment: Post Procedure Mammograms for Marker Placement

## 2020-12-18 IMAGING — US US  BREAST BX W/ LOC DEV 1ST LESION IMG BX SPEC US GUIDE*R*
1 series · 16 of 20 positions shown · non-contrast
Comparison: Previous exams including diagnostic mammogram and
ultrasound dated [DATE].
COMPARISON: Previous exams including diagnostic mammogram and
ultrasound dated [DATE].

Addendum:
CLINICAL DATA: Patient with 2 adjacent masses in the RIGHT breast
at the 9 o'clock axis, 4 cm from nipple, presents today for
ultrasound-guided biopsies to exclude malignancy.

EXAM:
ULTRASOUND GUIDED RIGHT BREAST CORE NEEDLE BIOPSY x2

[Series 1: us breast bx w/ loc dev 1st lesion img bx spec us  · 0.07mm/px · 16 of 20 slices shown]
[im 1/20]
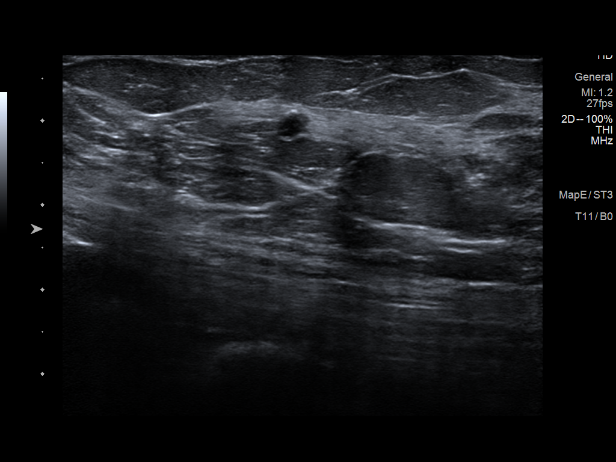
[im 2/20]
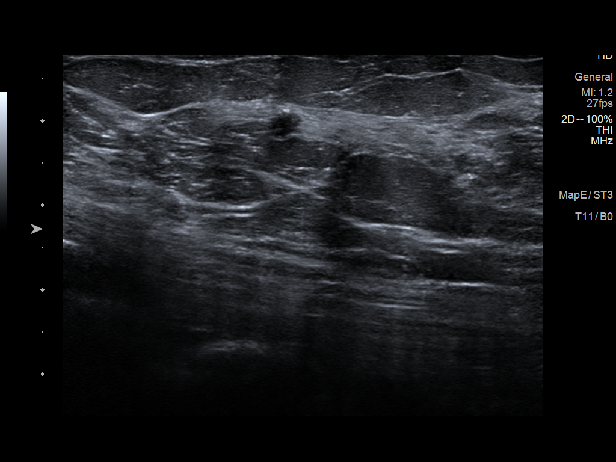
[im 4/20]
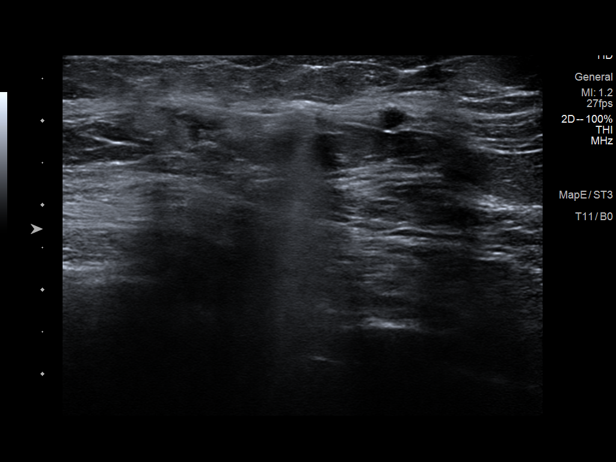
[im 5/20]
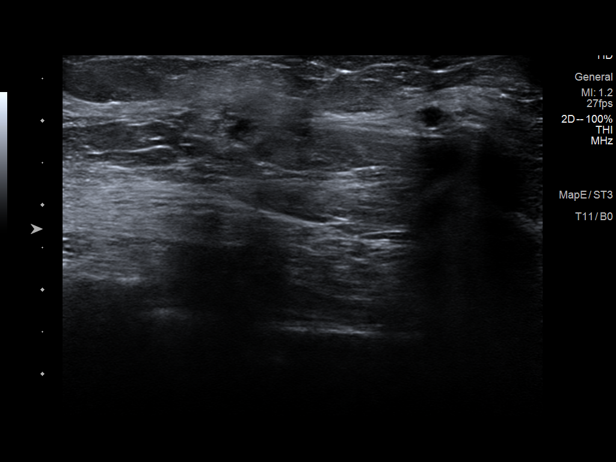
[im 6/20]
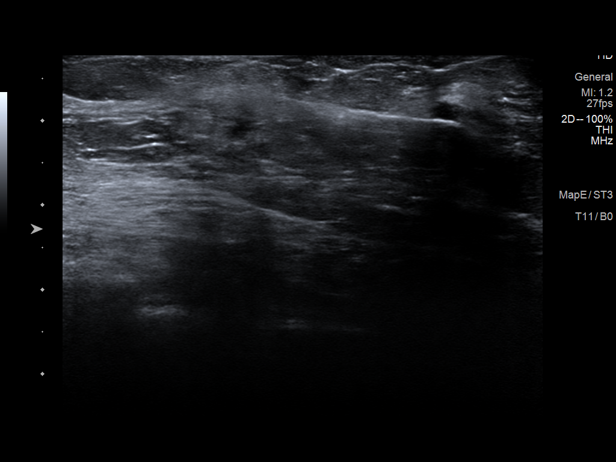
[im 7/20]
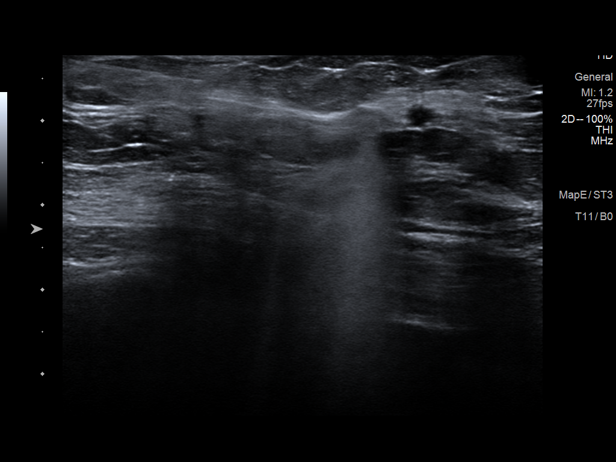
[im 9/20]
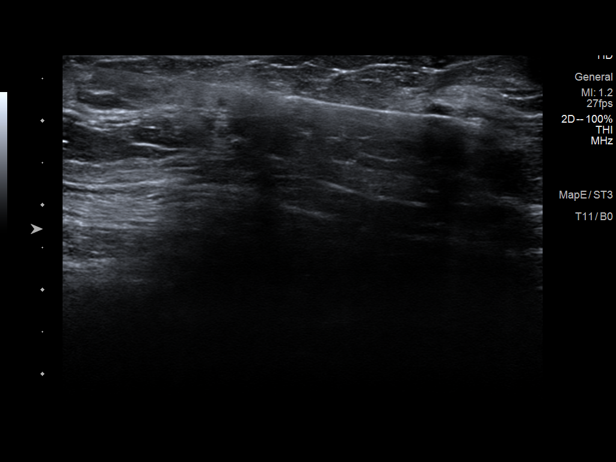
[im 10/20]
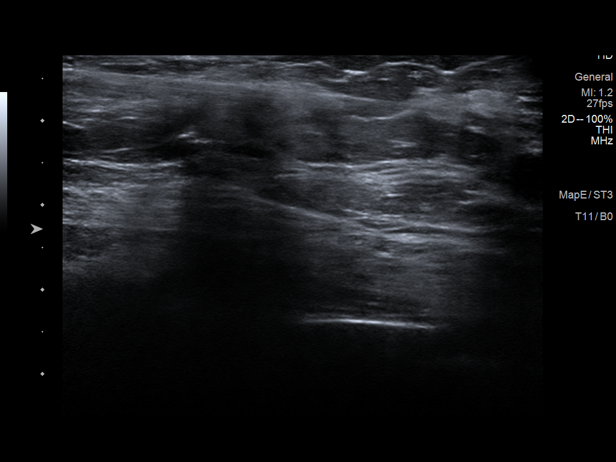
[im 11/20]
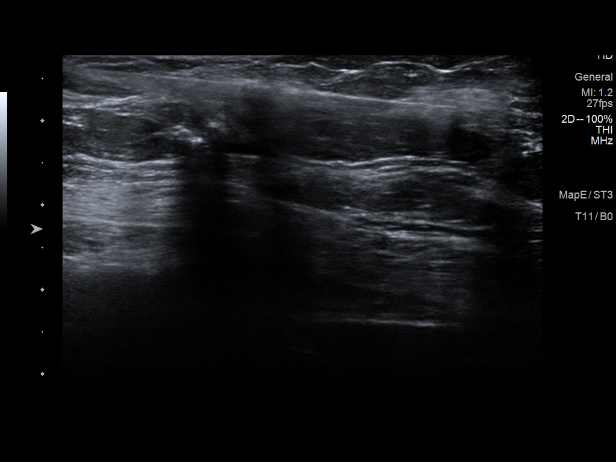
[im 12/20]
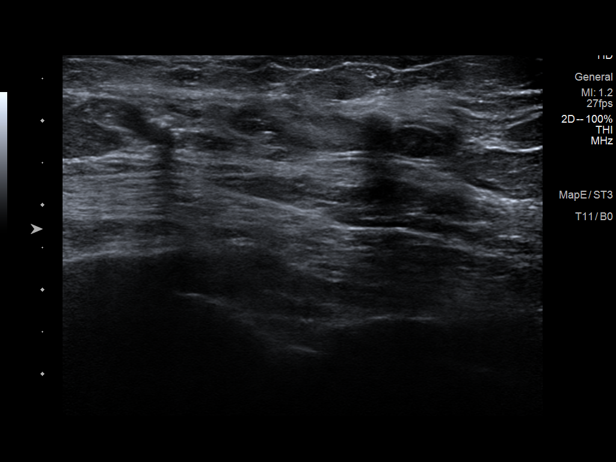
[im 14/20]
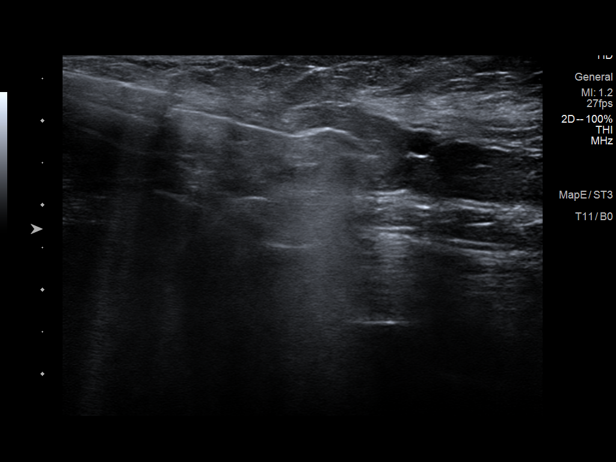
[im 15/20]
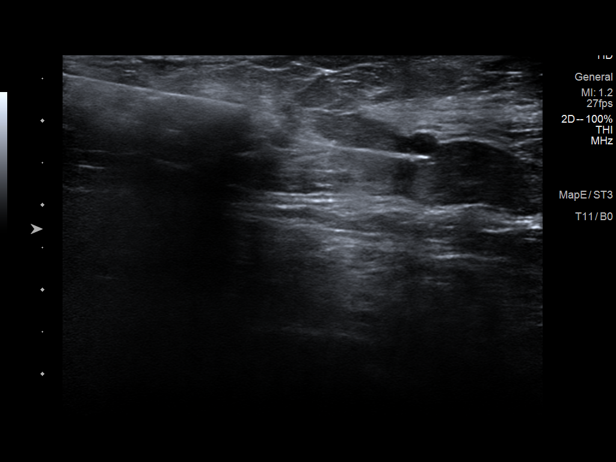
[im 16/20]
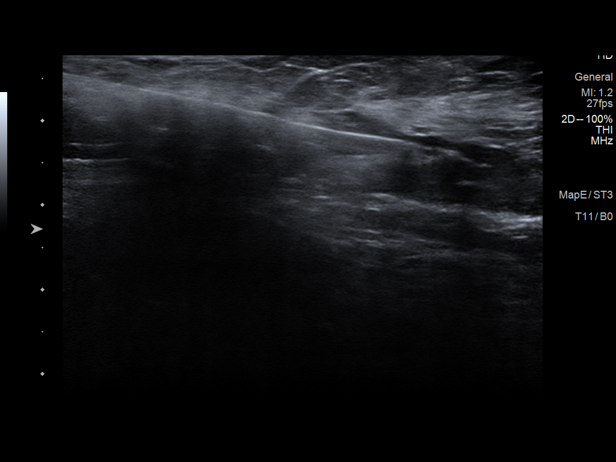
[im 17/20]
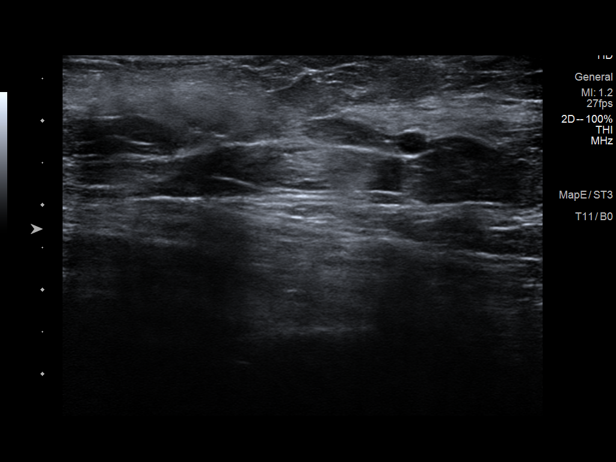
[im 19/20]
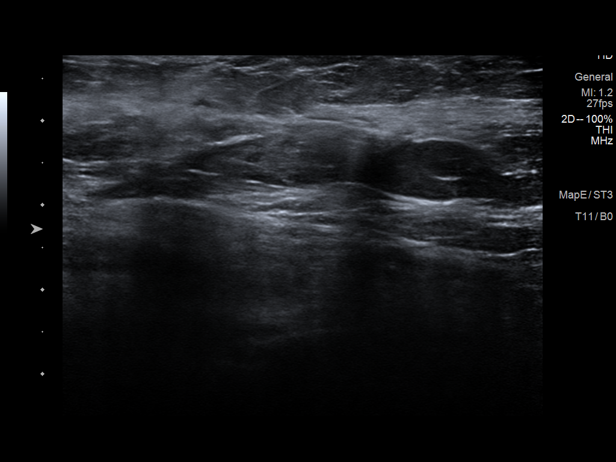
[im 20/20]
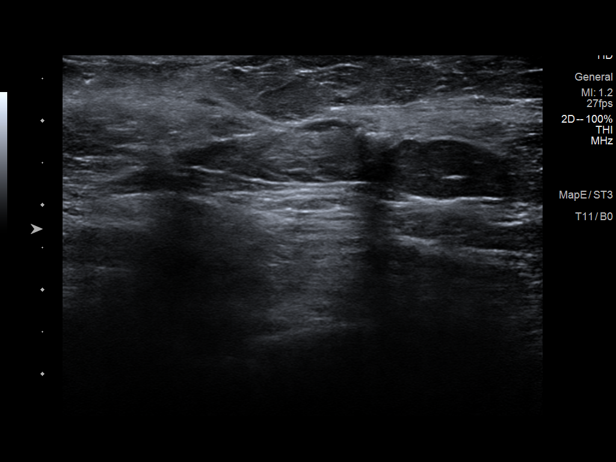

[16 of 20 positions shown; findings below may reference images not displayed]



Site 1: Lesion quadrant: 9 o'clock

Using sterile technique and 1% Lidocaine as local anesthetic, under
direct ultrasound visualization, a 12 gauge SAFTUYAH device was
used to perform biopsy of 1 of the RIGHT breast masses at the 9
o'clock axis, 4 cm from the nipple using a lateral approach. At the
conclusion of the procedure ribbon shaped tissue marker clip was
deployed into the biopsy cavity.

Site 2: Lesion quadrant: 9 o'clock

Using sterile technique and 1% Lidocaine as local anesthetic, under
direct ultrasound visualization, a 12 gauge SAFTUYAH device was
used to perform biopsy of the other of the RIGHT breast masses at
the 9 o'clock axis, 4 cm from the nipple, using a lateral approach.
At the conclusion of the procedure coil shaped tissue marker clip
was deployed into the biopsy cavity.

Follow up 2 view mammogram was performed and dictated separately.
IMPRESSION: 1. Ultrasound guided biopsy of 1 of the adjacent RIGHT breast masses
at the 9 o'clock axis, 4 cm from the nipple, with subsequent
placement of a RIBBON shaped clip at the biopsy site. No apparent
complications.
2. Ultrasound guided biopsy of the other of the adjacent RIGHT
breast masses at the 9 o'clock axis, 4 cm from the nipple, with
subsequent placement of a COIL shaped clip at the biopsy site. No
apparent complications.

ADDENDUM:
Pathology revealed FIBROCYSTIC CHANGES, FOCAL HEMOSIDERIN DEPOSITION
of the RIGHT breast, 9 o'clock, 4cm from nipple, (ribbon clip). This
was found to be concordant by Dr. SAFTUYAH.

Pathology revealed BENIGN FIBROADIPOSE TISSUE WITH RARE BENIGN
BREAST DUCTS of the RIGHT breast, 9 o'clock, 4cm from nipple, (coil
clip). This was found to be concordant by Dr. SAFTUYAH.

Pathology results were discussed with the patient by telephone. The
patient reported doing well after the biopsies with tenderness and
bleeding at the sites. Post biopsy instructions and care were
reviewed and questions were answered. The patient was encouraged to
call The [REDACTED] for any additional
concerns.

The patient was instructed to return for annual screening
mammography.

Pathology results reported by SAFTUYAH, RN on [DATE].



Site 1: Lesion quadrant: 9 o'clock

Using sterile technique and 1% Lidocaine as local anesthetic, under
direct ultrasound visualization, a 12 gauge SAFTUYAH device was
used to perform biopsy of 1 of the RIGHT breast masses at the 9
o'clock axis, 4 cm from the nipple using a lateral approach. At the
conclusion of the procedure ribbon shaped tissue marker clip was
deployed into the biopsy cavity.

Site 2: Lesion quadrant: 9 o'clock

Using sterile technique and 1% Lidocaine as local anesthetic, under
direct ultrasound visualization, a 12 gauge SAFTUYAH device was
used to perform biopsy of the other of the RIGHT breast masses at
the 9 o'clock axis, 4 cm from the nipple, using a lateral approach.
At the conclusion of the procedure coil shaped tissue marker clip
was deployed into the biopsy cavity.

Follow up 2 view mammogram was performed and dictated separately.
IMPRESSION: 1. Ultrasound guided biopsy of 1 of the adjacent RIGHT breast masses
at the 9 o'clock axis, 4 cm from the nipple, with subsequent
placement of a RIBBON shaped clip at the biopsy site. No apparent
complications.
2. Ultrasound guided biopsy of the other of the adjacent RIGHT
breast masses at the 9 o'clock axis, 4 cm from the nipple, with
subsequent placement of a COIL shaped clip at the biopsy site. No
apparent complications.

## 2020-12-23 ENCOUNTER — Telehealth: Payer: Self-pay | Admitting: Oncology

## 2020-12-23 ENCOUNTER — Encounter: Payer: Self-pay | Admitting: Oncology

## 2020-12-23 NOTE — Telephone Encounter (Signed)
Scheduled per 10/10 sch msg, pt has been called and confirmed appt

## 2020-12-25 ENCOUNTER — Inpatient Hospital Stay: Payer: 59

## 2020-12-25 ENCOUNTER — Inpatient Hospital Stay: Payer: 59 | Admitting: Oncology

## 2021-01-05 ENCOUNTER — Other Ambulatory Visit: Payer: Self-pay | Admitting: Physician Assistant

## 2021-01-15 ENCOUNTER — Other Ambulatory Visit: Payer: Self-pay | Admitting: *Deleted

## 2021-01-15 DIAGNOSIS — C50412 Malignant neoplasm of upper-outer quadrant of left female breast: Secondary | ICD-10-CM

## 2021-01-16 ENCOUNTER — Inpatient Hospital Stay (HOSPITAL_BASED_OUTPATIENT_CLINIC_OR_DEPARTMENT_OTHER): Payer: 59 | Admitting: Oncology

## 2021-01-16 ENCOUNTER — Other Ambulatory Visit: Payer: Self-pay

## 2021-01-16 ENCOUNTER — Inpatient Hospital Stay: Payer: 59 | Attending: Genetic Counselor

## 2021-01-16 VITALS — BP 142/85 | HR 80 | Temp 98.1°F | Resp 18 | Ht 66.0 in | Wt 201.5 lb

## 2021-01-16 DIAGNOSIS — J449 Chronic obstructive pulmonary disease, unspecified: Secondary | ICD-10-CM | POA: Insufficient documentation

## 2021-01-16 DIAGNOSIS — Z170 Estrogen receptor positive status [ER+]: Secondary | ICD-10-CM | POA: Insufficient documentation

## 2021-01-16 DIAGNOSIS — Z87891 Personal history of nicotine dependence: Secondary | ICD-10-CM | POA: Insufficient documentation

## 2021-01-16 DIAGNOSIS — I2510 Atherosclerotic heart disease of native coronary artery without angina pectoris: Secondary | ICD-10-CM | POA: Diagnosis not present

## 2021-01-16 DIAGNOSIS — K219 Gastro-esophageal reflux disease without esophagitis: Secondary | ICD-10-CM | POA: Diagnosis not present

## 2021-01-16 DIAGNOSIS — E039 Hypothyroidism, unspecified: Secondary | ICD-10-CM | POA: Insufficient documentation

## 2021-01-16 DIAGNOSIS — E538 Deficiency of other specified B group vitamins: Secondary | ICD-10-CM | POA: Insufficient documentation

## 2021-01-16 DIAGNOSIS — K449 Diaphragmatic hernia without obstruction or gangrene: Secondary | ICD-10-CM | POA: Diagnosis not present

## 2021-01-16 DIAGNOSIS — E785 Hyperlipidemia, unspecified: Secondary | ICD-10-CM | POA: Diagnosis not present

## 2021-01-16 DIAGNOSIS — R55 Syncope and collapse: Secondary | ICD-10-CM | POA: Diagnosis not present

## 2021-01-16 DIAGNOSIS — Z801 Family history of malignant neoplasm of trachea, bronchus and lung: Secondary | ICD-10-CM | POA: Insufficient documentation

## 2021-01-16 DIAGNOSIS — I10 Essential (primary) hypertension: Secondary | ICD-10-CM | POA: Insufficient documentation

## 2021-01-16 DIAGNOSIS — I341 Nonrheumatic mitral (valve) prolapse: Secondary | ICD-10-CM | POA: Insufficient documentation

## 2021-01-16 DIAGNOSIS — Z806 Family history of leukemia: Secondary | ICD-10-CM | POA: Diagnosis not present

## 2021-01-16 DIAGNOSIS — Z923 Personal history of irradiation: Secondary | ICD-10-CM | POA: Insufficient documentation

## 2021-01-16 DIAGNOSIS — Z7982 Long term (current) use of aspirin: Secondary | ICD-10-CM | POA: Diagnosis not present

## 2021-01-16 DIAGNOSIS — F419 Anxiety disorder, unspecified: Secondary | ICD-10-CM | POA: Diagnosis not present

## 2021-01-16 DIAGNOSIS — Z800 Family history of malignant neoplasm of digestive organs: Secondary | ICD-10-CM | POA: Insufficient documentation

## 2021-01-16 DIAGNOSIS — C50412 Malignant neoplasm of upper-outer quadrant of left female breast: Secondary | ICD-10-CM | POA: Insufficient documentation

## 2021-01-16 LAB — CMP (CANCER CENTER ONLY)
ALT: 21 U/L (ref 0–44)
AST: 18 U/L (ref 15–41)
Albumin: 4 g/dL (ref 3.5–5.0)
Alkaline Phosphatase: 92 U/L (ref 38–126)
Anion gap: 9 (ref 5–15)
BUN: 10 mg/dL (ref 6–20)
CO2: 25 mmol/L (ref 22–32)
Calcium: 9.2 mg/dL (ref 8.9–10.3)
Chloride: 104 mmol/L (ref 98–111)
Creatinine: 0.83 mg/dL (ref 0.44–1.00)
GFR, Estimated: >60 mL/min (ref >60)
Glucose, Bld: 88 mg/dL (ref 70–99)
Potassium: 4.1 mmol/L (ref 3.5–5.1)
Sodium: 138 mmol/L (ref 135–145)
Total Bilirubin: 0.4 mg/dL (ref 0.3–1.2)
Total Protein: 7.3 g/dL (ref 6.5–8.1)

## 2021-01-16 LAB — CBC WITH DIFFERENTIAL (CANCER CENTER ONLY)
Abs Immature Granulocytes: 0.01 10*3/uL (ref 0.00–0.07)
Basophils Absolute: 0 10*3/uL (ref 0.0–0.1)
Basophils Relative: 1 %
Eosinophils Absolute: 0.2 10*3/uL (ref 0.0–0.5)
Eosinophils Relative: 2 %
HCT: 39.9 % (ref 36.0–46.0)
Hemoglobin: 13.2 g/dL (ref 12.0–15.0)
Immature Granulocytes: 0 %
Lymphocytes Relative: 32 %
Lymphs Abs: 2.1 10*3/uL (ref 0.7–4.0)
MCH: 29.3 pg (ref 26.0–34.0)
MCHC: 33.1 g/dL (ref 30.0–36.0)
MCV: 88.7 fL (ref 80.0–100.0)
Monocytes Absolute: 0.5 10*3/uL (ref 0.1–1.0)
Monocytes Relative: 7 %
Neutro Abs: 3.8 10*3/uL (ref 1.7–7.7)
Neutrophils Relative %: 58 %
Platelet Count: 283 10*3/uL (ref 150–400)
RBC: 4.5 MIL/uL (ref 3.87–5.11)
RDW: 13.2 % (ref 11.5–15.5)
WBC Count: 6.6 10*3/uL (ref 4.0–10.5)
nRBC: 0 % (ref 0.0–0.2)

## 2021-01-16 NOTE — Progress Notes (Signed)
[place]  Telephone:([phone]) [phone] Fax:(336) [phone]     ID: Bethany [name] DOB: [birth year]  MR#: [id number]  ZJQ#:[id number]  Patient Care Team: Leeroy Cha, MD as PCP - General (Internal Medicine) Burnell Blanks, MD as PCP - Cardiology (Cardiology) Erroll Luna, MD as Consulting Physician (General Surgery) Kemani Heidel, Virgie Dad, MD as Consulting Physician (Oncology) Kyung Rudd, MD as Consulting Physician (Radiation Oncology) Irene Shipper, MD as Consulting Physician (Gastroenterology) [name], MD as Consulting Physician (Obstetrics and Gynecology) Delrae Rend, MD as Consulting Physician (Endocrinology) Mauro Kaufmann, RN as Oncology Nurse Navigator Rockwell Germany, RN as Oncology Nurse Navigator Icard, Octavio Graves, DO as Consulting Physician (Pulmonary Disease) Mcarthur Rossetti, MD as Consulting Physician (Orthopedic Surgery) Irene Limbo, MD as Consulting Physician (Plastic Surgery) OTHER MD:   CHIEF COMPLAINT: Estrogen receptor positive breast cancer  CURRENT TREATMENT: Observation   INTERVAL HISTORY: Bethany [name] returns today for follow-up of her estrogen receptor positive breast cancer. She is now under observation due to her inability to tolerate antiestrogens.  Since her last visit, she underwent genetic testing on 09/25/20. Results were negative.  She also underwent bilateral diagnostic mammography with tomography and right breast ultrasonography at [facility] on 12/10/2020 showing: breast density category B; two indeterminate right breast masses at 9 o'clock; no suspicious right axillary lymphadenopathy; left breast posttreatment changes without mammographic evidence of malignancy.  She proceeded to biopsies on 12/18/2020 of the right breast areas in question. Pathology from the procedure ([id number]) showed: 1. Breast, right, needle core biopsy, 9 o'clock, 4cm from nipple, ribbon clip  - FIBROCYSTIC  CHANGES. - FOCAL HEMOSIDERIN DEPOSITION. 2. Breast, right, needle core biopsy, 9 o'clock, 4cm from nipple, coil clip - BENIGN FIBROADIPOSE TISSUE WITH RARE BENIGN BREAST DUCTS. - NO EVIDENCE OF MALIGNANCY.  She is scheduled for fat grafting to left breast for symmetry on 03/13/2021 under Dr. Marla Roe.   REVIEW OF SYSTEMS: Bethany [name] is generally doing well.  She is not exercising regularly because she has a very old dog she keeps and she spends time with the dog.  A detailed review of systems is otherwise noncontributory   COVID 19 VACCINATION STATUS: Status post Pfizer x2 as of November 2022  HISTORY OF CURRENT ILLNESS: The original intake note:  "Bethany [name]" had routine screening mammography on 10/26/2017 showing a possible abnormality in the left breast. She underwent unilateral left diagnostic mammography with tomography and left breast ultrason[facility] on 11/11/2017 showing: breast density category B. There was a hypoechoic lesion consistent of a mass at the 2:30 o'clock upper outer quadrant middle depth and measuring 0.5 x 0.3 x 0.4 cm. Sonographic evaluation of the left axilla shows no enlarged or abnormal lymph nodes.  An attempt was made to obtain a biopsy of this lesion on 10/23/2017, however the patient had repeated episodes of syncope during the attempted procedure.  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 Margins were negative for carcinoma. Prognostic indicators significant for: estrogen receptor, 90% positive and progesterone receptor, 80% positive, both with strong staining intensity. Proliferation marker Ki67 at 3%. HER2 negative with an immunohistochemistry of (1+).  Then in a separate procedure she underwent left sentinel lymph node sampling on 12/09/2017 showing ([id number]): Four left axillary sentinel lymph were negative for carcinoma. (0/4).   Her subsequent history  is as detailed below.   PAST MEDICAL HISTORY: Past Medical History:  Diagnosis Date   ADD (attention  deficit disorder)    Anxiety    B12 deficiency    Breast cancer ([place]) 2019   left breast cancer/diagnosed in 10/2017/taking radiation treatment until 03/15/18   Breast mass, left 11/2017   going thru radiation until 02/2018   CAD (coronary artery disease)    a. DES to LAD 06/2019.   Chest pain 06/19/2014   COPD (chronic obstructive pulmonary disease) (HCC)    beginning stages/small scar   Dental crowns present    Depression    Hiatal hernia    History of hiatal hernia    no current med.   History of thyroid cancer 12/15/2017   Hypertension    states under control with med., has been on med. x 1 yr.   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[place])    Syncope and collapse 12/25/2008   Qualifier: Diagnosis of  By: Philemon Kingdom     Urinary incontinence    USI    Vitamin D deficiency   Thyroid cancer, GERD   PAST SURGICAL HISTORY: Past Surgical History:  Procedure Laterality Date   ABDOMINAL HYSTERECTOMY     partial   APPENDECTOMY     AXILLARY SENTINEL NODE BIOPSY Left 12/09/2017   Procedure: AXILLARY SENTINEL NODE BIOPSY;  Surgeon: Erroll Luna, MD;  Location: [facility];  Service: General;  Laterality: Left;   BREAST EXCISIONAL BIOPSY Right 09/20/2019   BREAST LUMPECTOMY Left 11/2017   BREAST LUMPECTOMY WITH NEEDLE LOCALIZATION Left 11/25/2017   Procedure: LEFT BREAST LUMPECTOMY WITH NEEDLE LOCALIZATION;  Surgeon: Erroll Luna, MD;  Location: [facility];  Service: General;  Laterality:  Left;   BREAST LUMPECTOMY WITH RADIOACTIVE SEED LOCALIZATION Right 09/20/2019   Procedure: RIGHT BREAST LUMPECTOMY WITH RADIOACTIVE SEED LOCALIZATION;  Surgeon: Erroll Luna, MD;  Location: [facility];  Service: General;  Laterality: Right;   COLONOSCOPY WITH PROPOFOL  10/03/2015   CORONARY STENT INTERVENTION N/A 06/29/2019   Procedure: CORONARY STENT INTERVENTION;  Surgeon: Burnell Blanks, MD;  Location: [facility];  Service: Cardiovascular;  Laterality: N/A;   LAPAROSCOPIC APPENDECTOMY N/A 10/03/2015   Procedure: APPENDECTOMY LAPAROSCOPIC;  Surgeon: Rolm Bookbinder, MD;  Location: [facility];  Service: General;  Laterality: N/A;   LEFT HEART CATH AND CORONARY ANGIOGRAPHY N/A 06/29/2019   Procedure: LEFT HEART CATH AND CORONARY ANGIOGRAPHY;  Surgeon: Burnell Blanks, MD;  Location: [facility];  Service: Cardiovascular;  Laterality: N/A;   TOTAL THYROIDECTOMY  04/02/2003  Hysterectomy without BSO   FAMILY HISTORY Family History  Problem Relation Age of Onset   Colon polyps Mother    High Cholesterol Mother    Alcoholism Mother    Hypertension Father    Heart disease Father    Non-Hodgkin's lymphoma Father 50   Cancer Father    Depression Father    Anxiety disorder Father    Alcoholism Father    Hypertension Brother    Lung cancer Paternal Aunt        d. 48   Throat cancer Maternal Grandfather        d. 74  Throat cancer Paternal Grandmother        dx < 39   Heart disease Paternal Grandfather    Colon cancer Other        great maternal aunt    Stomach cancer Neg Hx    Rectal cancer Neg Hx    Esophageal cancer Neg Hx   The patient's father died in 04/18/17 age age 26 due to non-Hodgkin's lymphoma. The patient's mother is alive at age 29 as of October 2019. The patient has 1 brother, no sisters. There was a paternal aunt with lung cancer. There was a paternal grandmother and maternal grandfather with throat cancer. She denies a  family history of breast or ovarian cancer.    GYNECOLOGIC HISTORY:  No LMP recorded. Patient has had a hysterectomy. Menarche: 60 years old Age at first live birth: 60 years old She is GX P3.  She is status post partial hysterectomy (without BSO) in her late 23's. She did not have HRT.    SOCIAL HISTORY: (updated 09/2018) Eunique owns a jewelry business. Her husband, Patrick Jupiter, is a Clinical cytogeneticist for [employer]. At home is her, her husband, and their two dogs. The patient's oldest son, [place], has 3 children and lives in [place] and works in Nurse, children's for [employer]. The patient's son, Alpha Gula lives in [place] with 1 daughter and works as a Teacher, music. The patient's youngest son, Aline Brochure lives in [place] and works for [employer] in [place]. The patient has 5 grandchildren total. Her grandchildren are ages 3, 58, 64, 24, and 3. She notes the youngest has Down Syndrome and recently turned 3. She does not currently belong to a church, but she is [place].     ADVANCED DIRECTIVES: In the absence of any documentation to the contrary, the patient's spouse is their HCPOA.    HEALTH MAINTENANCE: Social History   Tobacco Use   Smoking status: Former    Types: Cigarettes    Quit date: 08/14/2014    Years since quitting: 6.4   Smokeless tobacco: Never  Vaping Use   Vaping Use: Never used  Substance Use Topics   Alcohol use: Yes    Comment: rarely   Drug use: No    Colonoscopy: 03/15/2018- to be repeated in 3 years under Dr. Henrene Pastor  PAP:   Bone density: 10/25/2017 showed osteopenia   Allergies  Allergen Reactions   Oxycodone Nausea And Vomiting   Latex Rash   Other Rash    "Sutures caused severe rash."   Sulfa Antibiotics     hives    Current Outpatient Medications  Medication Sig Dispense Refill   albuterol (VENTOLIN HFA) 108 (90 Base) MCG/ACT inhaler Inhale 2 puffs into the lungs every 6 (six) hours as needed for wheezing or shortness of breath. 18 g 6   aspirin EC 81 MG tablet  Take 81 mg by mouth daily.     bacitracin ointment Apply 1 application topically 2 (two) times daily. 120 g 0   clopidogrel (PLAVIX) 75 MG tablet Take 1 tablet (75 mg total) by mouth daily. 90 tablet 3   ibuprofen (ADVIL) 600 MG tablet Take 1 tablet (600 mg total) by mouth every 6 (six) hours as needed. 30 tablet 0   levothyroxine (SYNTHROID, LEVOTHROID) 137 MCG tablet Take 137 mcg by mouth at bedtime.      losartan-hydrochlorothiazide (HYZAAR) 50-12.5 MG tablet Take 1 tablet by mouth daily. Please keep upcoming appt in 04/18/21 with Dr. Angelena Form before anymore refills. Thank you Final Attempt  90 tablet 0   Multiple Vitamin (MULTIVITAMIN WITH MINERALS) TABS tablet Take 1 tablet by mouth daily.     nitroGLYCERIN (NITROSTAT) 0.4 MG SL tablet Place 1 tablet (0.4 mg total) under the tongue every 5 (five) minutes as needed for chest pain. (Patient not taking: No sig reported) 25 tablet 3   Phentermine-Topiramate 11.25-69 MG CP24 1 capsule daily 30 capsule 0   rosuvastatin (CRESTOR) 40 MG tablet TAKE 1 TABLET BY MOUTH DAILY. PT NEEDS TO KEEP UPCOMING APPT IN AUG FOR FURTHER REFILLS 90 tablet 0   venlafaxine XR (EFFEXOR-XR) 150 MG 24 hr capsule TAKE 1 CAPSULE BY MOUTH EVERY DAY WITH BREAKFAST 90 capsule 5   Vitamin D, Ergocalciferol, (DRISDOL) 1.25 MG (50000 UNIT) CAPS capsule Take 1 capsule (50,000 Units total) by mouth every 7 (seven) days. 4 capsule 0   No current facility-administered medications for this visit.    OBJECTIVE: White woman in no acute distress  Vitals:   01/16/21 1455  BP: (!) 142/85  Pulse: 80  Resp: 18  Temp: 98.1 F (36.7 C)  SpO2: 98%      Body mass index is 32.52 kg/m.   Wt Readings from Last 3 Encounters:  01/16/21 201 lb 8 oz (91.4 kg)  07/30/20 206 lb (93.4 kg)  06/12/20 207 lb 14.4 oz (94.3 kg)     ECOG FS:1 - Symptomatic but completely ambulatory  Sclerae unicteric, EOMs intact Wearing a mask No cervical or supraclavicular adenopathy Lungs no rales or  rhonchi Heart regular rate and rhythm Abd soft, nontender, positive bowel sounds MSK no focal spinal tenderness, no upper extremity lymphedema Neuro: nonfocal, well oriented, appropriate affect Breasts: The right breast is unremarkable.  The left breast has undergone lumpectomy and radiation with no evidence of disease recurrence.  Both axillae are benign.   LAB RESULTS:  CMP     Component Value Date/Time   NA 139 09/25/2020 1110   NA 141 09/19/2019 1155   K 4.3 09/25/2020 1110   CL 104 09/25/2020 1110   CO2 28 09/25/2020 1110   GLUCOSE 91 09/25/2020 1110   BUN 9 09/25/2020 1110   BUN 11 09/19/2019 1155   CREATININE 0.95 09/25/2020 1110   CREATININE 0.78 12/16/2017 1540   CALCIUM 9.4 09/25/2020 1110   PROT 7.2 09/25/2020 1110   PROT 6.5 09/19/2019 1155   ALBUMIN 3.8 09/25/2020 1110   ALBUMIN 4.3 09/19/2019 1155   AST 19 09/25/2020 1110   AST 12 (L) 12/16/2017 1540   ALT 22 09/25/2020 1110   ALT 16 12/16/2017 1540   ALKPHOS 83 09/25/2020 1110   BILITOT 0.4 09/25/2020 1110   BILITOT 0.3 09/19/2019 1155   BILITOT 0.4 12/16/2017 1540   GFRNONAA >60 09/25/2020 1110   GFRNONAA >60 12/16/2017 1540   GFRAA >60 10/12/2019 0812   GFRAA >60 12/16/2017 1540    No results found for: TOTALPROTELP, ALBUMINELP, A1GS, A2GS, BETS, BETA2SER, GAMS, MSPIKE, SPEI  No results found for: KPAFRELGTCHN, LAMBDASER, KAPLAMBRATIO  Lab Results  Component Value Date   WBC 6.6 01/16/2021   NEUTROABS 3.8 01/16/2021   HGB 13.2 01/16/2021   HCT 39.9 01/16/2021   MCV 88.7 01/16/2021   PLT 283 01/16/2021   No results found for: LABCA2  No components found for: [id number]  No results for input(s): INR in the last 168 hours.  No results found for: LABCA2  No results found for: PPJ093  No results found for: OIZ124  No results found for: PYK998  No results found for:  BJ4782  No components found for: HGQUANT  No results found for: CEA1 / No results found for: CEA1   No results found  for: AFPTUMOR  No results found for: CHROMOGRNA  No results found for: HGBA, HGBA2QUANT, HGBFQUANT, HGBSQUAN (Hemoglobinopathy evaluation)   No results found for: LDH  No results found for: IRON, TIBC, IRONPCTSAT (Iron and TIBC)  No results found for: FERRITIN  Urinalysis    Component Value Date/Time   COLORURINE YELLOW 10/02/2015 2022   APPEARANCEUR CLOUDY (A) 10/02/2015 2022   LABSPEC 1.020 10/02/2015 2022   PHURINE 6.5 10/02/2015 2022   GLUCOSEU NEGATIVE 10/02/2015 2022   HGBUR NEGATIVE 10/02/2015 2022   [place] NEGATIVE 10/02/2015 2022   KETONESUR 15 (A) 10/02/2015 2022   PROTEINUR NEGATIVE 10/02/2015 2022   UROBILINOGEN 0.2 09/20/2014 1536   NITRITE NEGATIVE 10/02/2015 2022   LEUKOCYTESUR SMALL (A) 10/02/2015 2022    STUDIES: MM CLIP PLACEMENT RIGHT  Result Date: 12/18/2020 CLINICAL DATA:  Status post ultrasound-guided biopsies of 2 masses in the outer RIGHT breast. EXAM: 3D DIAGNOSTIC RIGHT MAMMOGRAM POST ULTRASOUND BIOPSY x2 COMPARISON:  Previous exam(s). FINDINGS: 3D Mammographic images were obtained following ultrasound guided biopsy of 2 adjacent masses within the RIGHT breast at the 9 o'clock axis. The 2 biopsy marking clips are in expected position at the site of biopsy. IMPRESSION: 1. Appropriate positioning of the ribbon shaped biopsy marking clip at the site of biopsy in the RIGHT breast at the 9 o'clock axis (site 1). 2. Appropriate positioning of the coil shaped biopsy marking clip at the site of biopsy in the RIGHT breast at the 9 o'clock axis (site 2). Final Assessment: Post Procedure Mammograms for Marker Placement Electronically Signed   By: Franki Cabot M.D.   On: 12/18/2020 13:59  [place] RT BREAST BX W LOC DEV 1ST LESION IMG BX SPEC US GUIDE  Addendum Date: 12/20/2020   ADDENDUM REPORT: 12/20/2020 14:48 ADDENDUM: Pathology revealed FIBROCYSTIC CHANGES, FOCAL HEMOSIDERIN DEPOSITION of the RIGHT breast, 9 o'clock, 4cm from nipple, (ribbon clip). This was  found to be concordant by Dr. Franki Cabot. Pathology revealed BENIGN FIBROADIPOSE TISSUE WITH RARE BENIGN BREAST DUCTS of the RIGHT breast, 9 o'clock, 4cm from nipple, (coil clip). This was found to be concordant by Dr. Franki Cabot. Pathology results were discussed with the patient by telephone. The patient reported doing well after the biopsies with tenderness and bleeding at the sites. Post biopsy instructions and care were reviewed and questions were answered. The patient was encouraged to call [facility] for any additional concerns. The patient was instructed to return for annual screening mammography. Pathology results reported by Terie Purser, RN on 12/20/2020. Electronically Signed   By: Franki Cabot M.D.   On: 12/20/2020 14:48   Result Date: 12/20/2020 CLINICAL DATA:  Patient with 2 adjacent masses in the RIGHT breast at the 9 o'clock axis, 4 cm from nipple, presents today for ultrasound-guided biopsies to exclude malignancy. EXAM: ULTRASOUND GUIDED RIGHT BREAST CORE NEEDLE BIOPSY x2 COMPARISON:  Previous exams including diagnostic mammogram and ultrasound dated 12/10/2020. PROCEDURE: I met with the patient and we discussed the procedure of ultrasound-guided biopsy, including benefits and alternatives. We discussed the high likelihood of a successful procedure. We discussed the risks of the procedure, including infection, bleeding, tissue injury, clip migration, and inadequate sampling. Informed written consent was given. The usual time-out protocol was performed immediately prior to the procedure. Site 1: Lesion quadrant: 9 o'clock Using sterile technique and 1% Lidocaine as  local anesthetic, under direct ultrasound visualization, a 12 gauge spring-loaded device was used to perform biopsy of 1 of the RIGHT breast masses at the 9 o'clock axis, 4 cm from the nipple using a lateral approach. At the conclusion of the procedure ribbon shaped tissue marker clip was deployed into  the biopsy cavity. Site 2: Lesion quadrant: 9 o'clock Using sterile technique and 1% Lidocaine as local anesthetic, under direct ultrasound visualization, a 12 gauge spring-loaded device was used to perform biopsy of the other of the RIGHT breast masses at the 9 o'clock axis, 4 cm from the nipple, using a lateral approach. At the conclusion of the procedure coil shaped tissue marker clip was deployed into the biopsy cavity. Follow up 2 view mammogram was performed and dictated separately. IMPRESSION: 1. Ultrasound guided biopsy of 1 of the adjacent RIGHT breast masses at the 9 o'clock axis, 4 cm from the nipple, with subsequent placement of a RIBBON shaped clip at the biopsy site. No apparent complications. 2. Ultrasound guided biopsy of the other of the adjacent RIGHT breast masses at the 9 o'clock axis, 4 cm from the nipple, with subsequent placement of a COIL shaped clip at the biopsy site. No apparent complications. Electronically Signed: By: Franki Cabot M.D. On: 12/18/2020 13:49  [place] RT BREAST BX W LOC DEV EA ADD LESION IMG BX SPEC US GUIDE  Addendum Date: 12/20/2020   ADDENDUM REPORT: 12/20/2020 14:48 ADDENDUM: Pathology revealed FIBROCYSTIC CHANGES, FOCAL HEMOSIDERIN DEPOSITION of the RIGHT breast, 9 o'clock, 4cm from nipple, (ribbon clip). This was found to be concordant by Dr. Franki Cabot. Pathology revealed BENIGN FIBROADIPOSE TISSUE WITH RARE BENIGN BREAST DUCTS of the RIGHT breast, 9 o'clock, 4cm from nipple, (coil clip). This was found to be concordant by Dr. Franki Cabot. Pathology results were discussed with the patient by telephone. The patient reported doing well after the biopsies with tenderness and bleeding at the sites. Post biopsy instructions and care were reviewed and questions were answered. The patient was encouraged to call [facility] for any additional concerns. The patient was instructed to return for annual screening mammography. Pathology results  reported by Terie Purser, RN on 12/20/2020. Electronically Signed   By: Franki Cabot M.D.   On: 12/20/2020 14:48   Result Date: 12/20/2020 CLINICAL DATA:  Patient with 2 adjacent masses in the RIGHT breast at the 9 o'clock axis, 4 cm from nipple, presents today for ultrasound-guided biopsies to exclude malignancy. EXAM: ULTRASOUND GUIDED RIGHT BREAST CORE NEEDLE BIOPSY x2 COMPARISON:  Previous exams including diagnostic mammogram and ultrasound dated 12/10/2020. PROCEDURE: I met with the patient and we discussed the procedure of ultrasound-guided biopsy, including benefits and alternatives. We discussed the high likelihood of a successful procedure. We discussed the risks of the procedure, including infection, bleeding, tissue injury, clip migration, and inadequate sampling. Informed written consent was given. The usual time-out protocol was performed immediately prior to the procedure. Site 1: Lesion quadrant: 9 o'clock Using sterile technique and 1% Lidocaine as local anesthetic, under direct ultrasound visualization, a 12 gauge spring-loaded device was used to perform biopsy of 1 of the RIGHT breast masses at the 9 o'clock axis, 4 cm from the nipple using a lateral approach. At the conclusion of the procedure ribbon shaped tissue marker clip was deployed into the biopsy cavity. Site 2: Lesion quadrant: 9 o'clock Using sterile technique and 1% Lidocaine as local anesthetic, under direct ultrasound visualization, a 12 gauge spring-loaded device was used to perform biopsy  of the other of the RIGHT breast masses at the 9 o'clock axis, 4 cm from the nipple, using a lateral approach. At the conclusion of the procedure coil shaped tissue marker clip was deployed into the biopsy cavity. Follow up 2 view mammogram was performed and dictated separately. IMPRESSION: 1. Ultrasound guided biopsy of 1 of the adjacent RIGHT breast masses at the 9 o'clock axis, 4 cm from the nipple, with subsequent placement of a RIBBON shaped  clip at the biopsy site. No apparent complications. 2. Ultrasound guided biopsy of the other of the adjacent RIGHT breast masses at the 9 o'clock axis, 4 cm from the nipple, with subsequent placement of a COIL shaped clip at the biopsy site. No apparent complications. Electronically Signed: By: Franki Cabot M.D. On: 12/18/2020 13:49    ELIGIBLE FOR AVAILABLE RESEARCH PROTOCOL: No   ASSESSMENT: 60 y.o. [name], [place] woman status post left breast upper outer quadrant lumpectomy 11/25/2017 for a pT1b pNX, stage Ia invasive ductal carcinoma, grade 1, estrogen and progesterone receptor positive, HER-2/neu negative, with an MIB-1 of 3%  (1) status post left axillary lymph node sampling 12/09/2017, all 4 sentinel lymph nodes clear  (2) Oncotype score of 24 predicts a 10 % risk of recurrence outside the breast over the next 9 years if the patient's only systemic therapy is an estrogens for 5 years.  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  (7) exemestane started 10/12/2019, discontinued 11/26/2019 with intolerable side effects  (a) bone density November 2021  (8) intensified screening:  (a) breast MRI yearly in March   (b) mammography yearly and September 2022   PLAN: Bethany [name] was unable to tolerate systemic therapy and that is why we are following her closely with a yearly MRI in addition  to her yearly mammography.  She will be due for the MRI in March and I have entered those orders.  I have encouraged her to walk on a daily basis.  I expressed sympathy for her dog who is like a family member and who according to their vet does not have long to live.  She will return to see [place] in a year, after her September mammography.  She knows to call for any other issue that may develop before then.  Total encounter time 20 minutes.*  Yuridia Couts, Virgie Dad, MD  01/16/21 3:00 PM Medical Oncology and Hematology [facility], [place] [address] Tel. [phone]    Fax. [phone]   I, Wilburn Mylar, am acting as scribe for Dr. Virgie Dad. Bailley [place].  I, Lurline Del MD, have reviewed the above documentation for accuracy and completeness, and I agree with the above.   *Total Encounter Time as defined by the Centers for Medicare and Medicaid Services includes, in addition to the face-to-face time of a patient visit (documented in the note above) non-face-to-face time: obtaining and reviewing outside  history, ordering and reviewing medications, tests or procedures, care coordination (communications with other health care professionals or caregivers) and documentation in the medical record.

## 2021-01-17 ENCOUNTER — Telehealth: Payer: Self-pay | Admitting: Oncology

## 2021-01-17 NOTE — Telephone Encounter (Signed)
Scheduled appointment per 11/03 los. Left message.

## 2021-02-13 ENCOUNTER — Emergency Department (HOSPITAL_COMMUNITY)
Admission: EM | Admit: 2021-02-13 | Discharge: 2021-02-13 | Disposition: A | Discharge status: Home / Self Care | Payer: 59 | Attending: Emergency Medicine | Admitting: Emergency Medicine

## 2021-02-13 ENCOUNTER — Other Ambulatory Visit: Payer: Self-pay

## 2021-02-13 ENCOUNTER — Emergency Department (HOSPITAL_COMMUNITY): Payer: 59

## 2021-02-13 ENCOUNTER — Encounter (HOSPITAL_COMMUNITY): Payer: Self-pay

## 2021-02-13 DIAGNOSIS — R4189 Other symptoms and signs involving cognitive functions and awareness: Secondary | ICD-10-CM

## 2021-02-13 DIAGNOSIS — Z7902 Long term (current) use of antithrombotics/antiplatelets: Secondary | ICD-10-CM | POA: Insufficient documentation

## 2021-02-13 DIAGNOSIS — Z91040 Latex allergy status: Secondary | ICD-10-CM | POA: Insufficient documentation

## 2021-02-13 DIAGNOSIS — Z955 Presence of coronary angioplasty implant and graft: Secondary | ICD-10-CM | POA: Insufficient documentation

## 2021-02-13 DIAGNOSIS — Z7982 Long term (current) use of aspirin: Secondary | ICD-10-CM | POA: Diagnosis not present

## 2021-02-13 DIAGNOSIS — Z87891 Personal history of nicotine dependence: Secondary | ICD-10-CM | POA: Diagnosis not present

## 2021-02-13 DIAGNOSIS — R55 Syncope and collapse: Secondary | ICD-10-CM | POA: Insufficient documentation

## 2021-02-13 DIAGNOSIS — Z853 Personal history of malignant neoplasm of breast: Secondary | ICD-10-CM | POA: Insufficient documentation

## 2021-02-13 DIAGNOSIS — J449 Chronic obstructive pulmonary disease, unspecified: Secondary | ICD-10-CM | POA: Insufficient documentation

## 2021-02-13 DIAGNOSIS — Z87898 Personal history of other specified conditions: Secondary | ICD-10-CM | POA: Diagnosis not present

## 2021-02-13 DIAGNOSIS — Z85850 Personal history of malignant neoplasm of thyroid: Secondary | ICD-10-CM | POA: Insufficient documentation

## 2021-02-13 DIAGNOSIS — I2510 Atherosclerotic heart disease of native coronary artery without angina pectoris: Secondary | ICD-10-CM | POA: Insufficient documentation

## 2021-02-13 DIAGNOSIS — Z79899 Other long term (current) drug therapy: Secondary | ICD-10-CM | POA: Insufficient documentation

## 2021-02-13 DIAGNOSIS — E039 Hypothyroidism, unspecified: Secondary | ICD-10-CM | POA: Diagnosis not present

## 2021-02-13 LAB — CBC WITH DIFFERENTIAL/PLATELET
Abs Immature Granulocytes: 0.04 10*3/uL (ref 0.00–0.07)
Basophils Absolute: 0 10*3/uL (ref 0.0–0.1)
Basophils Relative: 0 %
Eosinophils Absolute: 0.2 10*3/uL (ref 0.0–0.5)
Eosinophils Relative: 2 %
HCT: 40.6 % (ref 36.0–46.0)
Hemoglobin: 13 g/dL (ref 12.0–15.0)
Immature Granulocytes: 0 %
Lymphocytes Relative: 26 %
Lymphs Abs: 2.5 10*3/uL (ref 0.7–4.0)
MCH: 29.3 pg (ref 26.0–34.0)
MCHC: 32 g/dL (ref 30.0–36.0)
MCV: 91.4 fL (ref 80.0–100.0)
Monocytes Absolute: 0.5 10*3/uL (ref 0.1–1.0)
Monocytes Relative: 6 %
Neutro Abs: 6.4 10*3/uL (ref 1.7–7.7)
Neutrophils Relative %: 66 %
Platelets: 290 10*3/uL (ref 150–400)
RBC: 4.44 MIL/uL (ref 3.87–5.11)
RDW: 14.2 % (ref 11.5–15.5)
WBC: 9.7 10*3/uL (ref 4.0–10.5)
nRBC: 0 % (ref 0.0–0.2)

## 2021-02-13 LAB — TROPONIN I (HIGH SENSITIVITY)
Troponin I (High Sensitivity): 3 ng/L (ref <18)
Troponin I (High Sensitivity): 4 ng/L (ref <18)

## 2021-02-13 LAB — BASIC METABOLIC PANEL
Anion gap: 8 (ref 5–15)
BUN: 12 mg/dL (ref 6–20)
CO2: 28 mmol/L (ref 22–32)
Calcium: 8.6 mg/dL — ABNORMAL LOW (ref 8.9–10.3)
Chloride: 103 mmol/L (ref 98–111)
Creatinine, Ser: 0.85 mg/dL (ref 0.44–1.00)
GFR, Estimated: >60 mL/min (ref >60)
Glucose, Bld: 109 mg/dL — ABNORMAL HIGH (ref 70–99)
Potassium: 3.8 mmol/L (ref 3.5–5.1)
Sodium: 139 mmol/L (ref 135–145)

## 2021-02-13 LAB — CBG MONITORING, ED: Glucose-Capillary: 80 mg/dL (ref 70–99)

## 2021-02-13 LAB — D-DIMER, QUANTITATIVE: D-Dimer, Quant: 0.28 ug/mL-FEU (ref 0.00–0.50)

## 2021-02-13 IMAGING — DX DG CHEST 1V PORT
1 series · 1 of 1 positions shown · non-contrast
Comparison: [DATE]

CLINICAL DATA: Syncope.

EXAM:
PORTABLE CHEST 1 VIEW

[chest ap]
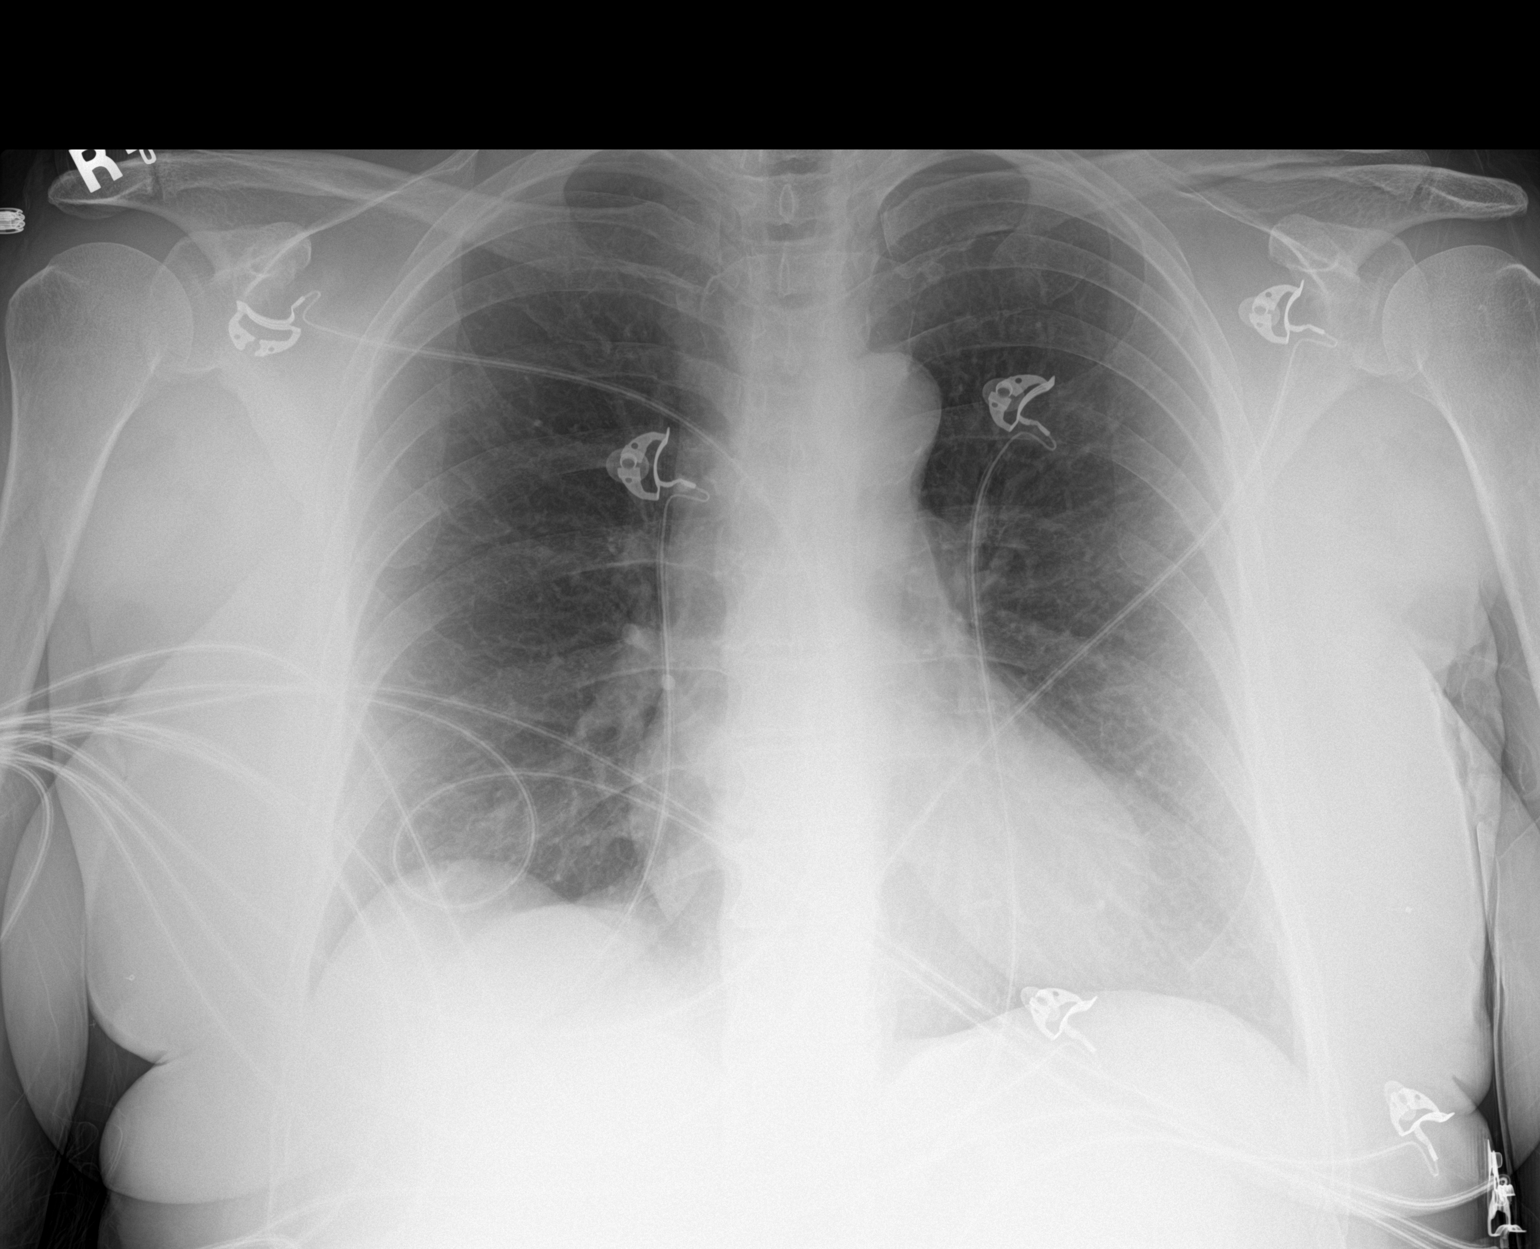

[1 of 1 positions shown; findings below may reference images not displayed]

FINDINGS: The heart size and mediastinal contours are within normal limits.
Both lungs are clear. The visualized skeletal structures are
unremarkable.
IMPRESSION: No active disease.

## 2021-02-13 MED ORDER — SODIUM CHLORIDE 0.9 % IV SOLN: INTRAVENOUS | Status: DC

## 2021-02-13 MED ORDER — SODIUM CHLORIDE 0.9 % IV BOLUS
1000.0000 mL | Freq: Once | INTRAVENOUS | Status: AC
  Administered 2021-02-13: 1000 mL via INTRAVENOUS

## 2021-02-13 NOTE — Consult Note (Signed)
Cardiology Consultation:   Patient ID: LARRY KNIPP MRN: [id number]; DOB: 1960/10/01  Admit date: 02/13/2021 Date of Consult: 02/13/2021  PCP:  Leeroy Cha, MD   [facility] Providers Cardiologist:  [name], MD        Patient Profile:   Brittie Whisnant Kovack is a 60 y.o. female with a hx of CAD s/p DES to LAD 06/2019, breast cancer, hiatal hernia, thyroid cancer, hypothyroidism, HTN, HLD, former tobacco abuse, pre-DM and anxiety who is being seen 02/13/2021 for the evaluation of unresponsive episode at the request of Dr. Gilford Raid.  History of Present Illness:   Ms. Lennon was initially evaluated for chest pain 05/2019. She had had EGD per Dr. Henrene Pastor 04/12/19 with no cause for chest pain isolated. She is known to have radiation induced lung fibrosis. 2D Echo 05/2019 showed normal LV systolic function, EF 70-62%, no valve disease, mild asymmetric LVH, mildly elevated PASP. She underwent LHC 06/29/19 showing 80% mLAD lesion, treated with PTCA/DES, otherwise no significant disease. As she was also in the midst of continued management of breast cancer (previously on the left with development in the right), it was recommended she should continue DAPT for 3 months then stop for her breast surgery. She was able to stop these in 09/2019, with recommendation to restart ASA 81mg  daily post-surgery. She was seen back in clinic 10/2019 and overall felt to be doing well. Plavix was resumed along with Imdur 15mg  daily due to intermittent brief episodes of chest pain. Follow-up was recommended in 6 months. Patient cancelled subsequent appointments.  She presented to the ED today with possible arrest. Per EDP note, "Per EMS, pt was at the urologist and was having a procedure done when she became unresponsive and required CPR.  I spoke with Dr. Claudia Desanctis (urology) who took care of this patient.  She said pt received nitrous oxide about 10 minutes prior to the event and local lidocaine about 30 minutes  prior to the event.  Dr. Claudia Desanctis said the procedure was quick and pt did fine during the procedure.  She left her to the recovery nurses and 1 of the nurses called out saying pt was unresponsive.  She went in and could not find a pulse.  They put in a pulse ox which was not picking up.  They put on an AED and it said it was an unshockable rhythm and to start CPR.  They did a few chest compressions and pt woke up.  By the time EMS arrived, she was awake and alert and feels fine.  She remembers feeling hot and then remembers waking up with everyone around her."  Per d/w EDP, BP was not obtained at time of event and no information available about HR. Initial BP on arrival to ED 134/76, pulse 62, temp WNL, O2 99% RA.   In speaking with the patient today she reports she was there for an in-office bladder support procedure. She'd never had laughing gas before. She reports she woke up from the procedure and saw the blood around her on the floor - it was much more than she expected to see and that's when she began to feel poorly and "went out." No CP, SOB, palpitations surrounding the event and otherwise felt in USOH. She does report she'd planned to call our office recently to discuss elevated BP and intermittent chest pains. Over the last several months she still has has had episodic fleeting chest pains lasting seconds to minutes that feel like indigestion with some dyspnea.  These resolve spontaneously. No other associated symptoms. These would happen a few times a week without provocation. She is able to exert herself I.e. vacuuming without provoking any cardiac symptoms. She did not have CP today.  CBC, troponin normal x1, follow-up value pending, K 3.8, renal function normal, d-dimer negative. CXR NAD. EKG shows NSR 62bpm, brief artifact, very subtle nonspecific STTW changes I, II, V3-V4. She is currently feeling well without complaint. Telemetry shows NSR.    Past Medical History:  Diagnosis Date   ADD  (attention deficit disorder)    Anxiety    B12 deficiency    Breast cancer ([place]) 2019   left breast cancer/diagnosed in 10/2017/taking radiation treatment until 03/15/18   Breast mass, left 11/2017   going thru radiation until 02/2018   CAD (coronary artery disease)    a. DES to LAD 06/2019.   Chest pain 06/19/2014   COPD (chronic obstructive pulmonary disease) (H[facility])    beginning stages/small scar   Dental crowns present    Depression    Hiatal hernia    History of hiatal hernia    no current med.   History of thyroid cancer 12/15/2017   Hypertension    states under control with med., has been on med. x 1 yr.   Hypothyroidism    Malignant neoplasm of upper-outer quadrant of left breast in female, estrogen receptor positive ([place])    Mild hyperlipidemia    Mucocele of appendix 10/03/2015   MVP (mitral valve prolapse)    not seen on echo 05/2019   Personal history of radiation therapy 2019   Left Breast Cancer   PONV (postoperative nausea and vomiting)    Prediabetes    Radiation fibrosis of lung ([place])    Syncope and collapse 12/25/2008   Qualifier: Diagnosis of  By: Philemon Kingdom     Urinary incontinence    USI    Vitamin D deficiency     Past Surgical History:  Procedure Laterality Date   ABDOMINAL HYSTERECTOMY     partial   APPENDECTOMY     AXILLARY SENTINEL NODE BIOPSY Left 12/09/2017   Procedure: AXILLARY SENTINEL NODE BIOPSY;  Surgeon: Erroll Luna, MD;  Location: [place];  Service: General;  Laterality: Left;   BREAST EXCISIONAL BIOPSY Right 09/20/2019   BREAST LUMPECTOMY Left 11/2017   BREAST LUMPECTOMY WITH NEEDLE LOCALIZATION Left 11/25/2017   Procedure: LEFT BREAST LUMPECTOMY WITH NEEDLE LOCALIZATION;  Surgeon: Erroll Luna, MD;  Location: [place];  Service: General;  Laterality: Left;   BREAST LUMPECTOMY WITH RADIOACTIVE SEED LOCALIZATION Right 09/20/2019   Procedure: RIGHT BREAST LUMPECTOMY WITH RADIOACTIVE SEED  LOCALIZATION;  Surgeon: Erroll Luna, MD;  Location: [place];  Service: General;  Laterality: Right;   COLONOSCOPY WITH PROPOFOL  10/03/2015   CORONARY STENT INTERVENTION N/A 06/29/2019   Procedure: CORONARY STENT INTERVENTION;  Surgeon: Burnell Blanks, MD;  Location: [place] [facility];  Service: Cardiovascular;  Laterality: N/A;   LAPAROSCOPIC APPENDECTOMY N/A 10/03/2015   Procedure: APPENDECTOMY LAPAROSCOPIC;  Surgeon: Rolm Bookbinder, MD;  Location: [place];  Service: General;  Laterality: N/A;   LEFT HEART CATH AND CORONARY ANGIOGRAPHY N/A 06/29/2019   Procedure: LEFT HEART CATH AND CORONARY ANGIOGRAPHY;  Surgeon: Burnell Blanks, MD;  Location: [place] [facility];  Service: Cardiovascular;  Laterality: N/A;   TOTAL THYROIDECTOMY  04/02/2003     Home Medications:  Prior to Admission medications   Medication Sig Start Date End Date Taking? Authorizing Provider  albuterol (VENTOLIN HFA) 108 (90 Base) MCG/ACT inhaler Inhale 2 puffs into the lungs every 6 (six) hours as needed for wheezing or shortness of breath. 11/24/18   Garner Nash, DO  aspirin EC 81 MG tablet Take 81 mg by mouth daily.    [provider]  clopidogrel (PLAVIX) 75 MG tablet Take 1 tablet (75 mg total) by mouth daily. 10/30/19   Burnell Blanks, MD  ibuprofen (ADVIL) 600 MG tablet Take 1 tablet (600 mg total) by mouth every 6 (six) hours as needed. 02/20/20   Lamptey, Myrene Galas, MD  levothyroxine (SYNTHROID, LEVOTHROID) 137 MCG tablet Take 137 mcg by mouth at bedtime.     [provider]  losartan-hydrochlorothiazide (HYZAAR) 50-12.5 MG tablet Take 1 tablet by mouth daily. Please keep upcoming appt in January 2023 with Dr. Angelena Form before anymore refills. Thank you Final Attempt 12/11/20   Burnell Blanks, MD  Multiple Vitamin (MULTIVITAMIN WITH MINERALS) TABS tablet Take 1 tablet by mouth daily.    [provider]  nitroGLYCERIN (NITROSTAT) 0.4 MG  SL tablet Place 1 tablet (0.4 mg total) under the tongue every 5 (five) minutes as needed for chest pain. Patient not taking: No sig reported 06/22/19   Burnell Blanks, MD  rosuvastatin (CRESTOR) 40 MG tablet TAKE 1 TABLET BY MOUTH DAILY. PT NEEDS TO KEEP UPCOMING APPT IN AUG FOR FURTHER REFILLS 01/06/21   [name], PA-C  venlafaxine XR (EFFEXOR-XR) 150 MG 24 hr capsule TAKE 1 CAPSULE BY MOUTH EVERY DAY WITH BREAKFAST 10/06/20   Causey, Charlestine Massed, NP  Vitamin D, Ergocalciferol, (DRISDOL) 1.25 MG (50000 UNIT) CAPS capsule Take 1 capsule (50,000 Units total) by mouth every 7 (seven) days. 02/28/20   Georgia Lopes, DO    Inpatient Medications: Scheduled Meds:  Continuous Infusions:  sodium chloride 125 mL/hr at 02/13/21 0952   PRN Meds:   Allergies:    Allergies  Allergen Reactions   Oxycodone Nausea And Vomiting   Latex Rash   Other Rash    "Sutures caused severe rash."   Sulfa Antibiotics     hives    Social History:   Social History   Socioeconomic History   Marital status: Married    Spouse name: Patrick Jupiter   Number of children: 3   Years of education: Not on file   Highest education level: Not on file  Occupational History   Occupation: stay at home spouse  Tobacco Use   Smoking status: Former    Types: Cigarettes    Quit date: 08/14/2014    Years since quitting: 6.5   Smokeless tobacco: Never  Vaping Use   Vaping Use: Never used  Substance and Sexual Activity   Alcohol use: Yes    Comment: rarely   Drug use: No   Sexual activity: Yes    Partners: Male    Birth control/protection: Surgical  Other Topics Concern   Not on file  Social History Narrative   Not on file   Social Determinants of Health   Financial Resource Strain: Not on file  Food Insecurity: Not on file  Transportation Needs: Not on file  Physical Activity: Not on file  Stress: Not on file  Social Connections: Not on file  Intimate Partner Violence: Not on file    Family  History:    Family History  Problem Relation Age of Onset   Colon polyps Mother    High Cholesterol Mother    Alcoholism Mother    Hypertension  Father    Heart disease Father    Non-Hodgkin's lymphoma Father 32   Cancer Father    Depression Father    Anxiety disorder Father    Alcoholism Father    Hypertension Brother    Lung cancer Paternal Aunt        d. 86   Throat cancer Maternal Grandfather        d. 79   Throat cancer Paternal Grandmother        dx < 33   Heart disease Paternal Grandfather    Colon cancer Other        great maternal aunt    Stomach cancer Neg Hx    Rectal cancer Neg Hx    Esophageal cancer Neg Hx      ROS:  Please see the history of present illness.  All other ROS reviewed and negative.     Physical Exam/Data:   Vitals:   02/13/21 1000 02/13/21 1015 02/13/21 1030 02/13/21 1045  BP: (!) 144/124 (!) 148/83 (!) 147/84 (!) 153/90  Pulse: 64 60 63 63  Resp: 14 16 18 18   Temp:      TempSrc:      SpO2: 100% 100% 100% 100%  Weight:      Height:       No intake or output data in the 24 hours ending 02/13/21 1052 Last 3 Weights 02/13/2021 01/16/2021 07/30/2020  Weight (lbs) 200 lb 9.9 oz 201 lb 8 oz 206 lb  Weight (kg) 91 kg 91.4 kg 93.441 kg     Body mass index is 32.38 kg/m.  General: Well developed, well nourished WF in no acute distress. Head: Normocephalic, atraumatic, sclera non-icteric, no xanthomas, nares are without discharge. Neck: Negative for carotid bruits. JVP not elevated. Lungs: Clear bilaterally to auscultation without wheezes, rales, or rhonchi. Breathing is unlabored. Heart: RRR S1 S2 without murmurs, rubs, or gallops.  Abdomen: Soft, non-tender, non-distended with normoactive bowel sounds. No rebound/guarding. Extremities: No clubbing or cyanosis. No edema. Distal pedal pulses are 2+ and equal bilaterally. Neuro: Alert and oriented X 3. Moves all extremities spontaneously. Psych:  Responds to questions appropriately with a  normal affect.   EKG:  The EKG was personally reviewed and demonstrates:  NSR 62bpm, brief artifact, very subtle nonspecific STTW changes I, II, V3-V4  Telemetry:  Telemetry was personally reviewed and demonstrates:  NSR  Relevant CV Studies: Cath 06/2019 Mid LAD lesion is 80% stenosed. A drug-eluting stent was successfully placed using a STENT RESOLUTE ONYX 3.0X18. Post intervention, there is a 0% residual stenosis.   1. Severe stenosis mid LAD 2. Successful PTCA/DES x 1 mid LAD   Recommendations: 3 months of DAPT with ASA and Plavix and then will stop for her breast surgery.   2D echo 05/2019   1. Left ventricular ejection fraction, by estimation, is 60 to 65%. The  left ventricle has normal function. The left ventricle has no regional  wall motion abnormalities. There is mild asymmetric left ventricular  hypertrophy. Left ventricular diastolic  parameters were normal.   2. Right ventricular systolic function is normal. The right ventricular  size is normal. There is mildly elevated pulmonary artery systolic  pressure.   3. The mitral valve is normal in structure. No evidence of mitral valve  regurgitation. No evidence of mitral stenosis.   4. The aortic valve is normal in structure. Aortic valve regurgitation is  not visualized. No aortic stenosis is present.     Laboratory Data:  High Sensitivity  Troponin:   Recent Labs  Lab 02/13/21 0910  TROPONINIHS 3     Chemistry Recent Labs  Lab 02/13/21 0910  NA 139  K 3.8  CL 103  CO2 28  GLUCOSE 109*  BUN 12  CREATININE 0.85  CALCIUM 8.6*  GFRNONAA >60  ANIONGAP 8    No results for input(s): PROT, ALBUMIN, AST, ALT, ALKPHOS, BILITOT in the last 168 hours. Lipids No results for input(s): CHOL, TRIG, HDL, LABVLDL, LDLCALC, CHOLHDL in the last 168 hours.  Hematology Recent Labs  Lab 02/13/21 0910  WBC 9.7  RBC 4.44  HGB 13.0  HCT 40.6  MCV 91.4  MCH 29.3  MCHC 32.0  RDW 14.2  PLT 290   Thyroid No results  for input(s): TSH, FREET4 in the last 168 hours.  BNPNo results for input(s): BNP, PROBNP in the last 168 hours.  DDimer  Recent Labs  Lab 02/13/21 0910  DDIMER 0.28     Radiology/Studies:  [facility] Chest Port 1 View  Result Date: 02/13/2021 CLINICAL DATA:  Syncope. EXAM: PORTABLE CHEST 1 VIEW COMPARISON:  02/20/2020 FINDINGS: The heart size and mediastinal contours are within normal limits. Both lungs are clear. The visualized skeletal structures are unremarkable. IMPRESSION: No active disease. Electronically Signed   By: Kerby Moors M.D.   On: 02/13/2021 09:36     Assessment and Plan:   1. Peri-procedurally unresponsiveness - patient recalls seeing blood on the floor around her then feeling poorly and "going out" - events described above, unclear what rhythm was on AED but was not VT/VF as it was not a shockable rhythm - quickly regained consciousness - troponin negative, d-dimer negative, not tachycardic, hypoxic or tachypneic - second troponin pending - suspect potential vasovagal episode - will review further with MD  2. CAD with episodic atypical chest pains - initial troponin here negative, no reproducible exertional anginal symptoms - will discuss further workup with Dr. Harrell Gave - would continue DAPT, statin - long term plan for duration of DAPT can be revisited in clinic with primary cardiologist as she will need outpatient follow-up for this as well  3. Essential HTN - patient reports recent elevated BP, anticipate med titration forthcoming  4. HLD - continue statin - last LDL 10/2019 was suboptimally controlled at 99, can address as OP  Risk Assessment/Risk Scores:      N/A  For questions or updates, please contact [place] Please consult [email or web address] for contact info under    Signed, [name], PA-C  02/13/2021 10:52 AM

## 2021-02-13 NOTE — ED Triage Notes (Signed)
Pt arrives via ems from an urologist. Ems states she was having a procedure done and she became unresponsive. Staff at the urologist actually did a few chest compressions and she became responsive. Pt is currently alert and oriented x 4.

## 2021-02-13 NOTE — ED Provider Notes (Signed)
[facility] DEPT Provider Note   CSN: [id number] Arrival date & time: 02/13/21  9024     History Chief Complaint  Patient presents with   Loss of Consciousness    Bethany [name] is a 60 y.o. female.  Pt presents to the ED today with a possible post arrest.  Per EMS, pt was at the urologist and was having a procedure done when she became unresponsive and required CPR.  I spoke with Dr. Claudia Desanctis (urology) who took care of this patient.  She said pt received nitrous oxide about 10 minutes prior to the event and local lidocaine about 30 minutes prior to the event.  Dr. Claudia Desanctis said the procedure was quick and pt did fine during the procedure.  She left her to the recovery nurses and 1 of the nurses called out saying pt was unresponsive.  She went in and could not find a pulse.  They put in a pulse ox which was not picking up.  They put on an AED and it said it was an unshockable rhythm and to start CPR.  They did a few chest compressions and pt woke up.  By the time EMS arrived, she was awake and alert and feels fine.  She remembers feeling hot and then remembers waking up with everyone around her.  She denies cp or sob.  She did have a stent placed in April 2021.      Past Medical History:  Diagnosis Date   ADD (attention deficit disorder)    Anxiety    B12 deficiency    Breast cancer ([place]) 2019   left breast cancer/diagnosed in 10/2017/taking radiation treatment until 03/15/18   Breast mass, left 11/2017   going thru radiation until 02/2018   CAD (coronary artery disease)    a. DES to LAD 06/2019.   Chest pain 06/19/2014   COPD (chronic obstructive pulmonary disease) (HCC)    beginning stages/small scar   Dental crowns present    Depression    Hiatal hernia    History of hiatal hernia    no current med.   History of thyroid cancer 12/15/2017   Hypertension    states under control with med., has been on med. x 1 yr.   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[place])    Syncope and collapse 12/25/2008   Qualifier: Diagnosis of  By: Philemon Kingdom     Urinary incontinence    USI    Vitamin D deficiency     Patient Active Problem List   Diagnosis Date Noted   Postoperative breast asymmetry 12/03/2020   Genetic testing 10/11/2020   Primary osteoarthritis of first carpometacarpal joint of right hand 10/24/2019   Primary osteoarthritis of first carpometacarpal joint of left hand 10/24/2019   Prediabetes 10/17/2019   Class 1 obesity due to excess calories with body mass index (BMI) of 30.0 to 30.9 in adult 10/16/2019   Hypertension 06/30/2019   Hyperlipidemia 06/30/2019   Unstable angina ([place])    History of thyroid cancer 12/15/2017   Mucocele of appendix 10/03/2015   Ischemic  chest pain ([place])    Chest pain 06/19/2014   Malignant neoplasm of upper-outer quadrant of left breast in female, estrogen receptor positive ([place])    Vitamin D deficiency    SYNCOPE AND COLLAPSE 12/25/2008    Past Surgical History:  Procedure Laterality Date   ABDOMINAL HYSTERECTOMY     partial   APPENDECTOMY     AXILLARY SENTINEL NODE BIOPSY Left 12/09/2017   Procedure: AXILLARY SENTINEL NODE BIOPSY;  Surgeon: Erroll Luna, MD;  Location: [facility];  Service: General;  Laterality: Left;   BREAST EXCISIONAL BIOPSY Right 09/20/2019   BREAST LUMPECTOMY Left 11/2017   BREAST LUMPECTOMY WITH NEEDLE LOCALIZATION Left 11/25/2017   Procedure: LEFT BREAST LUMPECTOMY WITH NEEDLE LOCALIZATION;  Surgeon: Erroll Luna, MD;  Location: [facility];  Service: General;  Laterality: Left;    BREAST LUMPECTOMY WITH RADIOACTIVE SEED LOCALIZATION Right 09/20/2019   Procedure: RIGHT BREAST LUMPECTOMY WITH RADIOACTIVE SEED LOCALIZATION;  Surgeon: Erroll Luna, MD;  Location: [facility];  Service: General;  Laterality: Right;   COLONOSCOPY WITH PROPOFOL  10/03/2015   CORONARY STENT INTERVENTION N/A 06/29/2019   Procedure: CORONARY STENT INTERVENTION;  Surgeon: Burnell Blanks, MD;  Location: [facility];  Service: Cardiovascular;  Laterality: N/A;   LAPAROSCOPIC APPENDECTOMY N/A 10/03/2015   Procedure: APPENDECTOMY LAPAROSCOPIC;  Surgeon: Rolm Bookbinder, MD;  Location: [facility];  Service: General;  Laterality: N/A;   LEFT HEART CATH AND CORONARY ANGIOGRAPHY N/A 06/29/2019   Procedure: LEFT HEART CATH AND CORONARY ANGIOGRAPHY;  Surgeon: Burnell Blanks, MD;  Location: [facility];  Service: Cardiovascular;  Laterality: N/A;   TOTAL THYROIDECTOMY  04/02/2003     OB History     Gravida  3   Para  3   Term  3   Preterm      AB      Living  3      SAB      IAB      Ectopic      Multiple      Live Births              Family History  Problem Relation Age of Onset   Colon polyps Mother    High Cholesterol Mother    Alcoholism Mother    Hypertension Father    Heart disease Father    Non-Hodgkin's lymphoma Father 19   Cancer Father    Depression Father    Anxiety disorder Father    Alcoholism Father    Hypertension Brother    Lung cancer Paternal Aunt        d. 45   Throat cancer Maternal Grandfather        d. 57   Throat cancer Paternal Grandmother        dx < 88   Heart disease Paternal Grandfather    Colon cancer Other        great maternal aunt    Stomach cancer Neg Hx    Rectal cancer Neg Hx    Esophageal cancer Neg Hx     Social History   Tobacco Use   Smoking status: Former    Types: Cigarettes    Quit date: 08/14/2014    Years since quitting: 6.5   Smokeless tobacco: Never  Vaping Use    Vaping Use: Never used  Substance Use Topics   Alcohol use: Yes    Comment: rarely   Drug use: No    Home Medications  Prior to Admission medications   Medication Sig Start Date End Date Taking? Authorizing Provider  albuterol (VENTOLIN HFA) 108 (90 Base) MCG/ACT inhaler Inhale 2 puffs into the lungs every 6 (six) hours as needed for wheezing or shortness of breath. 11/24/18   Garner Nash, DO  aspirin EC 81 MG tablet Take 81 mg by mouth daily.    [provider]  clopidogrel (PLAVIX) 75 MG tablet Take 1 tablet (75 mg total) by mouth daily. 10/30/19   Burnell Blanks, MD  ibuprofen (ADVIL) 600 MG tablet Take 1 tablet (600 mg total) by mouth every 6 (six) hours as needed. 02/20/20   Lamptey, Myrene Galas, MD  levothyroxine (SYNTHROID, LEVOTHROID) 137 MCG tablet Take 137 mcg by mouth at bedtime.     [provider]  losartan-hydrochlorothiazide (HYZAAR) 50-12.5 MG tablet Take 1 tablet by mouth daily. Please keep upcoming appt in January 2023 with Dr. Angelena Form before anymore refills. Thank you Final Attempt 12/11/20   Burnell Blanks, MD  Multiple Vitamin (MULTIVITAMIN WITH MINERALS) TABS tablet Take 1 tablet by mouth daily.    [provider]  nitroGLYCERIN (NITROSTAT) 0.4 MG SL tablet Place 1 tablet (0.4 mg total) under the tongue every 5 (five) minutes as needed for chest pain. Patient not taking: No sig reported 06/22/19   Burnell Blanks, MD  rosuvastatin (CRESTOR) 40 MG tablet TAKE 1 TABLET BY MOUTH DAILY. PT NEEDS TO KEEP UPCOMING APPT IN AUG FOR FURTHER REFILLS 01/06/21   [name], PA-C  venlafaxine XR (EFFEXOR-XR) 150 MG 24 hr capsule TAKE 1 CAPSULE BY MOUTH EVERY DAY WITH BREAKFAST 10/06/20   Causey, Charlestine Massed, NP  Vitamin D, Ergocalciferol, (DRISDOL) 1.25 MG (50000 UNIT) CAPS capsule Take 1 capsule (50,000 Units total) by mouth every 7 (seven) days. 02/28/20   Jearld Lesch A, DO    Allergies    Oxycodone, Latex, Other, and Sulfa  antibiotics  Review of Systems   Review of Systems  Neurological:  Positive for syncope.   Physical Exam Updated Vital Signs BP (!) 149/91   Pulse 64   Temp 97.7 F (36.5 C) (Oral)   Resp 17   Ht 5\' 6"  (1.676 m)   Wt 91 kg   SpO2 99%   BMI 32.38 kg/m   Physical Exam Vitals and nursing note reviewed.  Constitutional:      Appearance: Normal appearance.  HENT:     Head: Normocephalic and atraumatic.     Right Ear: External ear normal.     Left Ear: External ear normal.     Nose: Nose normal.     Mouth/Throat:     Mouth: Mucous membranes are moist.     Pharynx: Oropharynx is clear.  Eyes:     Extraocular Movements: Extraocular movements intact.     Conjunctiva/sclera: Conjunctivae normal.     Pupils: Pupils are equal, round, and reactive to light.  Cardiovascular:     Rate and Rhythm: Normal rate and regular rhythm.     Pulses: Normal pulses.     Heart sounds: Normal heart sounds.  Pulmonary:     Effort: Pulmonary effort is normal.     Breath sounds: Normal breath sounds.  Abdominal:     General: Abdomen is flat. Bowel sounds are normal.     Palpations: Abdomen is soft.  Musculoskeletal:        General: Normal range of motion.     Cervical back: Normal range of motion and neck supple.  Skin:  General: Skin is warm.     Capillary Refill: Capillary refill takes less than 2 seconds.  Neurological:     General: No focal deficit present.     Mental Status: She is alert and oriented to person, place, and time.  Psychiatric:        Mood and Affect: Mood normal.        Behavior: Behavior normal.    ED Results / Procedures / Treatments   Labs (all labs ordered are listed, but only abnormal results are displayed) Labs Reviewed  BASIC METABOLIC PANEL - Abnormal; Notable for the following components:      Result Value   Glucose, Bld 109 (*)    Calcium 8.6 (*)    All other components within normal limits  CBC WITH DIFFERENTIAL/PLATELET  D-DIMER, QUANTITATIVE   URINALYSIS, ROUTINE W REFLEX MICROSCOPIC  CBG MONITORING, ED  TROPONIN I (HIGH SENSITIVITY)  TROPONIN I (HIGH SENSITIVITY)    EKG EKG Interpretation  Date/Time:  Thursday February 13 2021 09:14:39 EST Ventricular Rate:  62 PR Interval:  172 QRS Duration: 91 QT Interval:  459 QTC Calculation: 467 R Axis:   12 Text Interpretation: Sinus rhythm Probable left atrial enlargement Abnormal R-wave progression, early transition Nonspecific T abnormalities, lateral leads No significant change since last tracing Confirmed by Isla Pence [id number]) on 02/13/2021 9:35:20 AM  Radiology DG Chest Port 1 View  Result Date: 02/13/2021 CLINICAL DATA:  Syncope. EXAM: PORTABLE CHEST 1 VIEW COMPARISON:  02/20/2020 FINDINGS: The heart size and mediastinal contours are within normal limits. Both lungs are clear. The visualized skeletal structures are unremarkable. IMPRESSION: No active disease. Electronically Signed   By: Kerby Moors M.D.   On: 02/13/2021 09:36    Procedures Procedures   Medications Ordered in ED Medications  sodium chloride 0.9 % bolus 1,000 mL (1,000 mLs Intravenous New Bag/Given 02/13/21 0924)    And  0.9 %  sodium chloride infusion ( Intravenous New Bag/Given 02/13/21 4656)    ED Course  I have reviewed the triage vital signs and the nursing notes.  Pertinent labs & imaging results that were available during my care of the patient were reviewed by me and considered in my medical decision making (see chart for details).    MDM Rules/Calculators/A&P                           Sx likely vasovagal.  Cardiac eval so far neg.  Cardiology has seen pt and feel she can go home.  Pt has close follow up with Dr. Angelena Form on 12/9.  Pt came with foley in place.  I spoke with urology and they said the foley can be removed prior to d/c.  Pt is stable for d/c.  Return if worse.    Final Clinical Impression(s) / ED Diagnoses Final diagnoses:  Vasovagal syncope    Rx / DC  Orders ED Discharge Orders     None        Isla Pence, MD 02/13/21 1242

## 2021-02-18 ENCOUNTER — Encounter: Payer: Self-pay | Admitting: Oncology

## 2021-02-20 NOTE — Progress Notes (Signed)
Chief Complaint  Patient presents with   Follow-up    CAD     History of Present Illness: 60 yo female with history of CAD, breast cancer in 2019 s/p radiation therapy, hiatal hernia, thyroid cancer, hypothyroidism, HTN, former tobacco abuse and anxiety here today for cardiac follow up. I saw her as a new patient for evaluation of chest pain in March 2021. She had been seen in the ED in January 2021 with chest pain. Gated cardiac CTA on 06/20/19 showed a LAD stenosis that appears to be flow limiting. Echo 06/05/19 showed normal LV systolic function and no valve disease Cardiac cath 06/29/19 with severe mid LAD stenosis treated with a drug eluting stent. She has been diagnosed with breast cancer in 2019 and has undergone radiation therapy and lumpectomy. Syncope last week following a urology procedure requiring nitrous oxide and lidocaine. This was felt to be vasovagal by ED staff.   She is here today for follow up. The patient denies any chest pain, dyspnea, palpitations, lower extremity edema, orthopnea, PND. She notes several other syncopal events over the past 6 years when having pain.   Primary Care Physician: Leeroy Cha, MD  Past Medical History:  Diagnosis Date   ADD (attention deficit disorder)    Anxiety    B12 deficiency    Breast cancer ([place]) 2019   left breast cancer/diagnosed in 10/2017/taking radiation treatment until 03/15/18   Breast mass, left 11/2017   going thru radiation until 02/2018   CAD (coronary artery disease)    a. DES to LAD 06/2019.   Chest pain 06/19/2014   COPD (chronic obstructive pulmonary disease) (HCC)    beginning stages/small scar   Dental crowns present    Depression    Hiatal hernia    History of hiatal hernia    no current med.   History of thyroid cancer 12/15/2017   Hypertension    states under control with med., has been on med. x 1 yr.   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[place])    Syncope and collapse 12/25/2008   Qualifier: Diagnosis of  By: Philemon Kingdom     Urinary incontinence    USI    Vitamin D deficiency     Past Surgical History:  Procedure Laterality Date   ABDOMINAL HYSTERECTOMY     partial   APPENDECTOMY     AXILLARY SENTINEL NODE BIOPSY Left 12/09/2017   Procedure: AXILLARY SENTINEL NODE BIOPSY;  Surgeon: Erroll Luna, MD;  Location: [facility];  Service: General;  Laterality: Left;   BREAST EXCISIONAL BIOPSY Right 09/20/2019   BREAST LUMPECTOMY Left 11/2017   BREAST LUMPECTOMY WITH NEEDLE LOCALIZATION Left 11/25/2017   Procedure: LEFT BREAST LUMPECTOMY WITH NEEDLE LOCALIZATION;  Surgeon: Erroll Luna, MD;  Location: [facility];  Service: General;  Laterality: Left;   BREAST LUMPECTOMY WITH RADIOACTIVE SEED LOCALIZATION Right 09/20/2019   Procedure: RIGHT BREAST LUMPECTOMY WITH RADIOACTIVE SEED LOCALIZATION;  Surgeon: Erroll Luna, MD;  Location: [facility];  Service: General;  Laterality: Right;   COLONOSCOPY WITH PROPOFOL  10/03/2015  CORONARY STENT INTERVENTION N/A 06/29/2019   Procedure: CORONARY STENT INTERVENTION;  Surgeon: Burnell Blanks, MD;  Location: [facility];  Service: Cardiovascular;  Laterality: N/A;   LAPAROSCOPIC APPENDECTOMY N/A 10/03/2015   Procedure: APPENDECTOMY LAPAROSCOPIC;  Surgeon: Rolm Bookbinder, MD;  Location: [facility];  Service: General;  Laterality: N/A;   LEFT HEART CATH AND CORONARY ANGIOGRAPHY N/A 06/29/2019   Procedure: LEFT HEART CATH AND CORONARY ANGIOGRAPHY;  Surgeon: Burnell Blanks, MD;  Location: [facility];  Service:  Cardiovascular;  Laterality: N/A;   TOTAL THYROIDECTOMY  04/02/2003    Current Outpatient Medications  Medication Sig Dispense Refill   albuterol (VENTOLIN HFA) 108 (90 Base) MCG/ACT inhaler Inhale 2 puffs into the lungs every 6 (six) hours as needed for wheezing or shortness of breath. 18 g 6   ibuprofen (ADVIL) 600 MG tablet Take 1 tablet (600 mg total) by mouth every 6 (six) hours as needed. 30 tablet 0   levothyroxine (SYNTHROID, LEVOTHROID) 137 MCG tablet Take 137 mcg by mouth at bedtime.      losartan-hydrochlorothiazide (HYZAAR) 50-12.5 MG tablet Take 1 tablet by mouth daily. Please keep upcoming appt in January 2023 with Dr. Angelena Form before anymore refills. Thank you Final Attempt 90 tablet 0   Multiple Vitamin (MULTIVITAMIN WITH MINERALS) TABS tablet Take 1 tablet by mouth daily.     nitroGLYCERIN (NITROSTAT) 0.4 MG SL tablet Place 1 tablet (0.4 mg total) under the tongue every 5 (five) minutes as needed for chest pain. 25 tablet 3   rosuvastatin (CRESTOR) 40 MG tablet TAKE 1 TABLET BY MOUTH DAILY. PT NEEDS TO KEEP UPCOMING APPT IN AUG FOR FURTHER REFILLS 90 tablet 0   venlafaxine XR (EFFEXOR-XR) 150 MG 24 hr capsule TAKE 1 CAPSULE BY MOUTH EVERY DAY WITH BREAKFAST 90 capsule 5   Vitamin D, Ergocalciferol, (DRISDOL) 1.25 MG (50000 UNIT) CAPS capsule Take 1 capsule (50,000 Units total) by mouth every 7 (seven) days. 4 capsule 0   aspirin EC 81 MG tablet Take 81 mg by mouth daily. (Patient not taking: Reported on 02/21/2021)     clopidogrel (PLAVIX) 75 MG tablet Take 1 tablet (75 mg total) by mouth daily. (Patient not taking: Reported on 02/21/2021) 90 tablet 3   No current facility-administered medications for this visit.    Allergies  Allergen Reactions   Oxycodone Nausea And Vomiting   Latex Rash   Other Rash    "Sutures caused severe rash."   Sulfa Antibiotics     hives    Social History   Socioeconomic History   Marital status: Married    Spouse name: Patrick Jupiter   Number of  children: 3   Years of education: Not on file   Highest education level: Not on file  Occupational History   Occupation: stay at home spouse  Tobacco Use   Smoking status: Former    Types: Cigarettes    Quit date: 08/14/2014    Years since quitting: 6.5   Smokeless tobacco: Never  Vaping Use   Vaping Use: Never used  Substance and Sexual Activity   Alcohol use: Yes    Comment: rarely   Drug use: No   Sexual activity: Yes    Partners: Male    Birth control/protection: Surgical  Other Topics Concern   Not on file  Social History Narrative   Not on file   Social Determinants of Health   Financial Resource Strain: Not on file  Food Insecurity: Not on file  Transportation Needs:  Not on file  Physical Activity: Not on file  Stress: Not on file  Social Connections: Not on file  Intimate Partner Violence: Not on file    Family History  Problem Relation Age of Onset   Colon polyps Mother    High Cholesterol Mother    Alcoholism Mother    Hypertension Father    Heart disease Father    Non-Hodgkin's lymphoma Father 16   Cancer Father    Depression Father    Anxiety disorder Father    Alcoholism Father    Hypertension Brother    Lung cancer Paternal Aunt        d. 78   Throat cancer Maternal Grandfather        d. 85   Throat cancer Paternal Grandmother        dx < 50   Heart disease Paternal Grandfather    Colon cancer Other        great maternal aunt    Stomach cancer Neg Hx    Rectal cancer Neg Hx    Esophageal cancer Neg Hx     Review of Systems:  As stated in the HPI and otherwise negative.   BP 134/80   Pulse 60   Ht 5\' 6"  (1.676 m)   Wt 205 lb 3.2 oz (93.1 kg)   SpO2 96%   BMI 33.12 kg/m   Physical Examination: General: Well developed, well nourished, NAD  HEENT: OP clear, mucus membranes moist  SKIN: warm, dry. No rashes. Neuro: No focal deficits  Musculoskeletal: Muscle strength 5/5 all ext  Psychiatric: Mood and affect normal  Neck: No JVD,  no carotid bruits, no thyromegaly, no lymphadenopathy.  Lungs:Clear bilaterally, no wheezes, rhonci, crackles Cardiovascular: Regular rate and rhythm. No murmurs, gallops or rubs. Abdomen:Soft. Bowel sounds present. Non-tender.  Extremities: No lower extremity edema. Pulses are 2 + in the bilateral DP/PT.  EKG:  EKG is not ordered today. The ekg ordered today demonstrates  Echo 06/05/19:  1. Left ventricular ejection fraction, by estimation, is 60 to 65%. The  left ventricle has normal function. The left ventricle has no regional  wall motion abnormalities. There is mild asymmetric left ventricular  hypertrophy. Left ventricular diastolic  parameters were normal.   2. Right ventricular systolic function is normal. The right ventricular  size is normal. There is mildly elevated pulmonary artery systolic  pressure.   3. The mitral valve is normal in structure. No evidence of mitral valve  regurgitation. No evidence of mitral stenosis.   4. The aortic valve is normal in structure. Aortic valve regurgitation is  not visualized. No aortic stenosis is present.   Cardiac cath 06/29/19: Left Anterior Descending  Vessel is large.  Mid LAD lesion is 80% stenosed.  Left Circumflex  Vessel is large.  Right Coronary Artery  Vessel is large.  Intervention    Recent Labs: 01/16/2021: ALT 21 02/13/2021: BUN 12; Creatinine, Ser 0.85; Hemoglobin 13.0; Platelets 290; Potassium 3.8; Sodium 139   Lipid Panel    Component Value Date/Time   CHOL 191 10/16/2019 1246   TRIG 107 10/16/2019 1246   HDL 73 10/16/2019 1246   CHOLHDL 2.3 09/19/2019 1155   CHOLHDL 3.7 06/19/2014 2234   VLDL 18 06/19/2014 2234   LDLCALC 99 10/16/2019 1246     Wt Readings from Last 3 Encounters:  02/21/21 205 lb 3.2 oz (93.1 kg)  02/13/21 200 lb 9.9 oz (91 kg)  01/16/21 201 lb 8 oz (91.4 kg)  Other studies Reviewed: Additional studies/ records that were reviewed today include:  Review of the above records  demonstrates:    Assessment and Plan:   1. CAD without angina: No chest pain. Will continue ASA and statin.   2. HTN: BP is controlled. Continue current therapy  3. Hyperlipidemia: LDL 99 in August 2021. Will check lipids and LFTs today. Continue statin  4. Syncope: Likely vasovagal. Each episode has clear triggers. Check echo now.   Current medicines are reviewed at length with the patient today.  The patient does not have concerns regarding medicines.  The following changes have been made:  no change  Labs/ tests ordered today include:   Orders Placed This Encounter  Procedures   Lipid panel   Hepatic function panel   ECHOCARDIOGRAM COMPLETE    Disposition:   F/U with me in 12 months.     Signed, [name], MD 02/21/2021 9:05 AM    [facility] [address], [place], [place]  [address] Phone: [phone]; Fax: [phone]

## 2021-02-21 ENCOUNTER — Other Ambulatory Visit: Payer: Self-pay

## 2021-02-21 ENCOUNTER — Encounter: Payer: 59 | Admitting: Physician Assistant

## 2021-02-21 ENCOUNTER — Ambulatory Visit (INDEPENDENT_AMBULATORY_CARE_PROVIDER_SITE_OTHER): Payer: 59 | Admitting: Cardiovascular Disease

## 2021-02-21 ENCOUNTER — Encounter: Payer: Self-pay | Admitting: Cardiovascular Disease

## 2021-02-21 VITALS — BP 134/80 | HR 60 | Ht 66.0 in | Wt 205.2 lb

## 2021-02-21 DIAGNOSIS — E785 Hyperlipidemia, unspecified: Secondary | ICD-10-CM

## 2021-02-21 DIAGNOSIS — R55 Syncope and collapse: Secondary | ICD-10-CM

## 2021-02-21 DIAGNOSIS — I25118 Atherosclerotic heart disease of native coronary artery with other forms of angina pectoris: Secondary | ICD-10-CM

## 2021-02-21 DIAGNOSIS — I10 Essential (primary) hypertension: Secondary | ICD-10-CM

## 2021-02-21 LAB — HEPATIC FUNCTION PANEL
ALT: 20 IU/L (ref 0–32)
AST: 22 IU/L (ref 0–40)
Albumin: 4.4 g/dL (ref 3.8–4.9)
Alkaline Phosphatase: 106 IU/L (ref 44–121)
Bilirubin Total: 0.2 mg/dL (ref 0.0–1.2)
Bilirubin, Direct: <0.1 mg/dL (ref 0.00–0.40)
Total Protein: 6.4 g/dL (ref 6.0–8.5)

## 2021-02-21 LAB — LIPID PANEL
Chol/HDL Ratio: 2.6 ratio (ref 0.0–4.4)
Cholesterol, Total: 199 mg/dL (ref 100–199)
HDL: 77 mg/dL (ref >39)
LDL Chol Calc (NIH): 111 mg/dL — ABNORMAL HIGH (ref 0–99)
Triglycerides: 62 mg/dL (ref 0–149)
VLDL Cholesterol Cal: 11 mg/dL (ref 5–40)

## 2021-02-21 NOTE — Patient Instructions (Signed)
Medication Instructions:  Your physician recommends that you continue on your current medications as directed. Please refer to the Current Medication list given to you today.  *If you need a refill on your cardiac medications before your next appointment, please call your pharmacy*   Lab Work: Lipids, Lft's- Today    If you have labs (blood work) drawn today and your tests are completely normal, you will receive your results only by: [place] (if you have MyChart) OR A paper copy in the mail If you have any lab test that is abnormal or we need to change your treatment, we will call you to review the results.   Testing/Procedures: Your physician has requested that you have an echocardiogram. Echocardiography is a painless test that uses sound waves to create images of your heart. It provides your doctor with information about the size and shape of your heart and how well your heart's chambers and valves are working. This procedure takes approximately one hour. There are no restrictions for this procedure.    Follow-Up: At [facility], you and your health needs are our priority.  As part of our continuing mission to provide you with exceptional heart care, we have created designated Provider Care Teams.  These Care Teams include your primary Cardiologist (physician) and Advanced Practice Providers (APPs -  Physician Assistants and Nurse Practitioners) who all work together to provide you with the care you need, when you need it.  We recommend signing up for the patient portal called "MyChart".  Sign up information is provided on this After Visit Summary.  MyChart is used to connect with patients for Virtual Visits (Telemedicine).  Patients are able to view lab/test results, encounter notes, upcoming appointments, etc.  Non-urgent messages can be sent to your provider as well.   To learn more about what you can do with MyChart, go to [email or web address].    Your next appointment:    12 month(s)  The format for your next appointment:   In Person  Provider:   [name], MD {     Other Instructions None

## 2021-02-24 ENCOUNTER — Telehealth: Payer: Self-pay | Admitting: *Deleted

## 2021-02-24 DIAGNOSIS — I25118 Atherosclerotic heart disease of native coronary artery with other forms of angina pectoris: Secondary | ICD-10-CM

## 2021-02-24 DIAGNOSIS — E785 Hyperlipidemia, unspecified: Secondary | ICD-10-CM

## 2021-02-24 NOTE — Telephone Encounter (Signed)
-----   Message from Burnell Blanks, MD sent at 02/24/2021  7:34 AM EST ----- LDL not at goal on Crestor 40 mg daily. She reported compliance with Crestor. Given her CAD, I would like to have her LDL much lower. I think we need to refer her to the lipid clinic to consider [place] or Praluent. Gerald Stabs

## 2021-02-24 NOTE — Telephone Encounter (Signed)
Reviewed with patient who is agreeable to a visit in lipid clinic.  Referral placed and appointment scheduled.

## 2021-02-26 ENCOUNTER — Telehealth: Payer: Self-pay | Admitting: Licensed Clinical Social Worker

## 2021-02-26 NOTE — Telephone Encounter (Signed)
Attempted to contact patient re: counseling options for anxiety as noted in last mychart message. No answer. Left VM with direct contact information.

## 2021-02-28 ENCOUNTER — Encounter (INDEPENDENT_AMBULATORY_CARE_PROVIDER_SITE_OTHER): Payer: Self-pay

## 2021-03-07 ENCOUNTER — Ambulatory Visit (HOSPITAL_COMMUNITY): Payer: 59

## 2021-03-13 ENCOUNTER — Encounter (HOSPITAL_BASED_OUTPATIENT_CLINIC_OR_DEPARTMENT_OTHER): Payer: Self-pay

## 2021-03-13 ENCOUNTER — Ambulatory Visit (HOSPITAL_BASED_OUTPATIENT_CLINIC_OR_DEPARTMENT_OTHER): Admit: 2021-03-13 | Payer: 59 | Admitting: Plastic Surgery

## 2021-03-13 SURGERY — LIPOSUCTION, WITH FAT TRANSFER
Anesthesia: General | Site: Breast | Laterality: Left

## 2021-03-16 HISTORY — PX: BREAST EXCISIONAL BIOPSY: SUR124

## 2021-03-20 ENCOUNTER — Ambulatory Visit (INDEPENDENT_AMBULATORY_CARE_PROVIDER_SITE_OTHER): Payer: 59 | Admitting: Pharmacist

## 2021-03-20 ENCOUNTER — Other Ambulatory Visit: Payer: Self-pay

## 2021-03-20 DIAGNOSIS — E785 Hyperlipidemia, unspecified: Secondary | ICD-10-CM | POA: Diagnosis not present

## 2021-03-20 DIAGNOSIS — I2510 Atherosclerotic heart disease of native coronary artery without angina pectoris: Secondary | ICD-10-CM | POA: Diagnosis not present

## 2021-03-20 NOTE — Patient Instructions (Signed)
I will submit a prior authorization for Repatha. I will let you know when I hear back.  Please try to Increase water intake Decrease intake of Pepsi, red meat and potatoes  Try to increase your walking- physical activity

## 2021-03-20 NOTE — Progress Notes (Signed)
Patient ID: Bethany [name]                 DOB: 1960/06/07                    MRN: [id number]     HPI: Bethany [name] is a 61 y.o. female patient referred to lipid clinic by [facility]. PMH is significant for CAD, breast cancer in 2019 s/p radiation therapy, hiatal hernia, thyroid cancer, hypothyroidism, HTN, former tobacco abuse and anxiety. Cardiac cath 06/29/19 with severe mid LAD stenosis treated with a drug eluting stent. LDL-C on rosuvastatin 40mg  daily is 111. Due to her premature disease she was stent to lipid clinic to discuss additional medications.  Patient presents today to lipid clinic. She is tolerating rosuvastatin well. Admits that she has not been very active. Cannot tolerate the cold. Eats a lot of red meat. Is on her husbands insurance now. But in March he will be retiring and going on disability. She has to find her own plan.   Current Medications: rosuvastatin 40mg  daily Risk Factors: CAD, HTN, pre-mature disease LDL goal: <55  Diet: breakfast: coffee Lunch: doesn't eat much Dinner: steaks, hamburgers, vegetables, fries, candy, potatoes Drink: diet/caffeine free pepsi, not a big water drinker  Exercise: not real active  Family History:  Family History  Problem Relation Age of Onset   Colon polyps Mother    High Cholesterol Mother    Alcoholism Mother    Hypertension Father    Heart disease Father    Non-Hodgkin's lymphoma Father 48   Cancer Father    Depression Father    Anxiety disorder Father    Alcoholism Father    Hypertension Brother    Lung cancer Paternal Aunt        d. 42   Throat cancer Maternal Grandfather        d. 82   Throat cancer Paternal Grandmother        dx < 43   Heart disease Paternal Grandfather    Colon cancer Other        great maternal aunt    Stomach cancer Neg Hx    Rectal cancer Neg Hx    Esophageal cancer Neg Hx      Social History: no alcohol  Labs: 02/21/21 TC 199, TG 62, HDL 77, LDL-C 111  Past Medical History:   Diagnosis Date   ADD (attention deficit disorder)    Anxiety    B12 deficiency    Breast cancer ([place]) 2019   left breast cancer/diagnosed in 10/2017/taking radiation treatment until 03/15/18   Breast mass, left 11/2017   going thru radiation until 02/2018   CAD (coronary artery disease)    a. DES to LAD 06/2019.   Chest pain 06/19/2014   COPD (chronic obstructive pulmonary disease) (HCC)    beginning stages/small scar   Dental crowns present    Depression    Hiatal hernia    History of hiatal hernia    no current med.   History of thyroid cancer 12/15/2017   Hypertension    states under control with med., has been on med. x 1 yr.   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 18 g 6   aspirin EC 81 MG tablet Take 81 mg by mouth daily. (Patient not taking: Reported on 02/21/2021)     clopidogrel (PLAVIX) 75 MG tablet Take 1 tablet (75 mg total) by mouth daily. (Patient not taking: Reported on 02/21/2021) 90 tablet 3   ibuprofen (ADVIL) 600 MG tablet Take 1 tablet (600 mg total) by mouth every 6 (six) hours as needed. 30 tablet 0   levothyroxine (SYNTHROID, LEVOTHROID) 137 MCG tablet Take 137 mcg by mouth at bedtime.      losartan-hydrochlorothiazide  (HYZAAR) 50-12.5 MG tablet Take 1 tablet by mouth daily. Please keep upcoming appt in January 2023 with Dr. Angelena Form before anymore refills. Thank you Final Attempt 90 tablet 0   Multiple Vitamin (MULTIVITAMIN WITH MINERALS) TABS tablet Take 1 tablet by mouth daily.     nitroGLYCERIN (NITROSTAT) 0.4 MG SL tablet Place 1 tablet (0.4 mg total) under the tongue every 5 (five) minutes as needed for chest pain. 25 tablet 3   rosuvastatin (CRESTOR) 40 MG tablet TAKE 1 TABLET BY MOUTH DAILY. PT NEEDS TO KEEP UPCOMING APPT IN AUG FOR FURTHER REFILLS 90 tablet 0   venlafaxine XR (EFFEXOR-XR) 150 MG 24 hr capsule TAKE 1 CAPSULE BY MOUTH EVERY DAY WITH BREAKFAST 90 capsule 5   Vitamin D, Ergocalciferol, (DRISDOL) 1.25 MG (50000 UNIT) CAPS capsule Take 1 capsule (50,000 Units total) by mouth every 7 (seven) days. 4 capsule 0   No current facility-administered medications on file prior to visit.    Allergies  Allergen Reactions   Oxycodone Nausea And Vomiting   Latex Rash   Other Rash    "Sutures caused severe rash."   Sulfa Antibiotics     hives    Assessment/Plan:  1. Hyperlipidemia - LDL-C is above goal of <55. Patient agreeable to staring PCSK9i. She is aware that she will continue rosuvastatin 40mg  daily. I will submit prior authorization for Repatha. Patient was encouraged to decrease her red meat, potatoes, and soda intake. Increase her intake of beans, vegetables and some lean meats. Increase physical activity.   Thank you,   Ramond Dial, Pharm.D, BCPS, CPP [place]  [address] [place], [place] [address]  Phone: [phone]; Fax: [phone]

## 2021-03-21 ENCOUNTER — Encounter: Payer: 59 | Admitting: Plastic Surgery

## 2021-03-24 ENCOUNTER — Telehealth: Payer: Self-pay | Admitting: Pharmacist

## 2021-03-24 NOTE — Telephone Encounter (Signed)
Praluent denied as patient has not tried zetia. Appeals letter was faxed since ezetimibe would not get patient's LDL-C to goal. Attempted to call pt, but no answer and mailbox was full. I will try again later.

## 2021-03-25 ENCOUNTER — Ambulatory Visit (HOSPITAL_COMMUNITY): Payer: 59

## 2021-03-25 NOTE — Telephone Encounter (Signed)
Patient made aware that PA for Praluent was denied but that we faxed appeals letter yesterday.

## 2021-04-01 ENCOUNTER — Other Ambulatory Visit: Payer: Self-pay | Admitting: Physician Assistant

## 2021-04-04 ENCOUNTER — Encounter: Payer: 59 | Admitting: Surgical

## 2021-04-07 ENCOUNTER — Other Ambulatory Visit: Payer: Self-pay | Admitting: Cardiovascular Disease

## 2021-04-07 NOTE — Telephone Encounter (Signed)
PRALUENT APPROVED TO 10/02/21 WILL ROUTE TO [place]

## 2021-04-07 NOTE — Addendum Note (Signed)
Addended by: Marcelle Overlie D on: 04/07/2021 05:33 PM   Modules accepted: Orders

## 2021-04-08 MED ORDER — PRALUENT 75 MG/ML ~~LOC~~ SOAJ: 1.0000 "pen " | SUBCUTANEOUS | 11 refills | Status: DC

## 2021-04-08 NOTE — Telephone Encounter (Signed)
Rx copay card activated. I called info into [facility]. They stated that they needed to put info onfile since they didn't have the medication. Patient will need to remind them to run the card.  BIN# [id number] PCN# [id number] GRP# [id number] ID# [id number]  Left message per [facility]

## 2021-04-08 NOTE — Addendum Note (Signed)
Addended by: Marcelle Overlie D on: 04/08/2021 12:43 PM   Modules accepted: Orders

## 2021-04-10 ENCOUNTER — Ambulatory Visit (HOSPITAL_COMMUNITY): Payer: 59

## 2021-04-14 ENCOUNTER — Ambulatory Visit: Payer: 59 | Admitting: Cardiovascular Disease

## 2021-04-22 ENCOUNTER — Other Ambulatory Visit (HOSPITAL_COMMUNITY): Payer: 59

## 2021-04-29 DIAGNOSIS — Z0289 Encounter for other administrative examinations: Secondary | ICD-10-CM

## 2021-05-01 ENCOUNTER — Other Ambulatory Visit (HOSPITAL_COMMUNITY): Payer: 59

## 2021-05-05 ENCOUNTER — Other Ambulatory Visit: Payer: Self-pay | Admitting: *Deleted

## 2021-05-05 DIAGNOSIS — C50412 Malignant neoplasm of upper-outer quadrant of left female breast: Secondary | ICD-10-CM

## 2021-05-05 DIAGNOSIS — Z170 Estrogen receptor positive status [ER+]: Secondary | ICD-10-CM

## 2021-05-05 DIAGNOSIS — Z9189 Other specified personal risk factors, not elsewhere classified: Secondary | ICD-10-CM

## 2021-05-07 ENCOUNTER — Encounter (INDEPENDENT_AMBULATORY_CARE_PROVIDER_SITE_OTHER): Payer: Self-pay

## 2021-05-14 ENCOUNTER — Encounter (INDEPENDENT_AMBULATORY_CARE_PROVIDER_SITE_OTHER): Payer: Self-pay

## 2021-05-15 ENCOUNTER — Ambulatory Visit (INDEPENDENT_AMBULATORY_CARE_PROVIDER_SITE_OTHER): Payer: 59 | Admitting: Bariatrics

## 2021-05-22 ENCOUNTER — Other Ambulatory Visit: Payer: Self-pay

## 2021-05-22 ENCOUNTER — Encounter (INDEPENDENT_AMBULATORY_CARE_PROVIDER_SITE_OTHER): Payer: Self-pay | Admitting: Bariatrics

## 2021-05-22 ENCOUNTER — Ambulatory Visit (INDEPENDENT_AMBULATORY_CARE_PROVIDER_SITE_OTHER): Payer: Self-pay | Admitting: Bariatrics

## 2021-05-22 VITALS — BP 143/93 | HR 88 | Temp 98.1°F | Ht 66.0 in | Wt 203.0 lb

## 2021-05-22 DIAGNOSIS — I10 Essential (primary) hypertension: Secondary | ICD-10-CM

## 2021-05-22 DIAGNOSIS — E038 Other specified hypothyroidism: Secondary | ICD-10-CM

## 2021-05-22 DIAGNOSIS — E538 Deficiency of other specified B group vitamins: Secondary | ICD-10-CM

## 2021-05-22 DIAGNOSIS — E559 Vitamin D deficiency, unspecified: Secondary | ICD-10-CM

## 2021-05-22 DIAGNOSIS — E785 Hyperlipidemia, unspecified: Secondary | ICD-10-CM

## 2021-05-22 DIAGNOSIS — R5383 Other fatigue: Secondary | ICD-10-CM

## 2021-05-22 DIAGNOSIS — Z6832 Body mass index (BMI) 32.0-32.9, adult: Secondary | ICD-10-CM

## 2021-05-22 DIAGNOSIS — Z1331 Encounter for screening for depression: Secondary | ICD-10-CM

## 2021-05-22 DIAGNOSIS — R0602 Shortness of breath: Secondary | ICD-10-CM

## 2021-05-22 DIAGNOSIS — E669 Obesity, unspecified: Secondary | ICD-10-CM

## 2021-05-22 DIAGNOSIS — E668 Other obesity: Secondary | ICD-10-CM

## 2021-05-22 DIAGNOSIS — R7303 Prediabetes: Secondary | ICD-10-CM

## 2021-05-22 DIAGNOSIS — R632 Polyphagia: Secondary | ICD-10-CM

## 2021-05-24 LAB — COMPREHENSIVE METABOLIC PANEL
ALT: 25 IU/L (ref 0–32)
AST: 21 IU/L (ref 0–40)
Albumin/Globulin Ratio: 2.1 (ref 1.2–2.2)
Albumin: 4.6 g/dL (ref 3.8–4.9)
Alkaline Phosphatase: 98 IU/L (ref 44–121)
BUN/Creatinine Ratio: 16 (ref 9–23)
BUN: 13 mg/dL (ref 6–24)
Bilirubin Total: 0.3 mg/dL (ref 0.0–1.2)
CO2: 22 mmol/L (ref 20–29)
Calcium: 9.6 mg/dL (ref 8.7–10.2)
Chloride: 102 mmol/L (ref 96–106)
Creatinine, Ser: 0.79 mg/dL (ref 0.57–1.00)
Globulin, Total: 2.2 g/dL (ref 1.5–4.5)
Glucose: 101 mg/dL — ABNORMAL HIGH (ref 70–99)
Potassium: 4.8 mmol/L (ref 3.5–5.2)
Sodium: 140 mmol/L (ref 134–144)
Total Protein: 6.8 g/dL (ref 6.0–8.5)
eGFR: 86 mL/min/{1.73_m2} (ref >59)

## 2021-05-24 LAB — HEMOGLOBIN A1C
Est. average glucose Bld gHb Est-mCnc: 117 mg/dL
Hgb A1c MFr Bld: 5.7 % — ABNORMAL HIGH (ref 4.8–5.6)

## 2021-05-24 LAB — INSULIN, RANDOM: INSULIN: 15.5 u[IU]/mL (ref 2.6–24.9)

## 2021-05-26 ENCOUNTER — Encounter (INDEPENDENT_AMBULATORY_CARE_PROVIDER_SITE_OTHER): Payer: Self-pay | Admitting: Bariatrics

## 2021-05-26 NOTE — Progress Notes (Signed)
Chief Complaint:   OBESITY Bethany Bethany [name] (MR# [id number]) is Bethany [name] 61 y.o. female who presents for evaluation and treatment of obesity and related comorbidities. Current BMI is Body mass index is 32.77 kg/m. Bethany Bethany [name] has been struggling with her weight for many years and has been unsuccessful in either losing weight, maintaining weight loss, or reaching her healthy weight goal.  Bethany Bethany [name] is Bethany [name] returning patient to our facility. She was last seen 02/28/2020. Her goal is about 170 lbs. Her appetite is high at this time.   Bethany Bethany [name] is currently in the action stage of change and ready to dedicate time achieving and maintaining Bethany [name] healthier weight. Bethany Bethany [name] is interested in becoming our patient and working on intensive lifestyle modifications including (but not limited to) diet and exercise for weight loss.  Bethany Bethany [name]'Bethany Bethany [name] habits were reviewed today and are as follows: Her family eats meals together, she thinks her family will eat healthier with her, her desired weight loss is 33, she started gaining weight in 2005, her heaviest weight ever was 220 pounds, she is Bethany [name] picky eater and doesn't like to eat healthier foods, she has significant food cravings issues, she snacks frequently in the evenings, she skips meals frequently, she is frequently drinking liquids with calories, she frequently makes poor food choices, she has problems with excessive hunger, she frequently eats larger portions than normal, she has binge eating behaviors, and she struggles with emotional eating.  Depression Screen Bethany Bethany [name]'Bethany Bethany [name] Food and Mood (modified PHQ-9) score was 17.  Depression screen PHQ 2/9 05/22/2021  Decreased Interest 3  Down, Depressed, Hopeless 3  PHQ - 2 Score 6  Altered sleeping 1  Tired, decreased energy 3  Change in appetite 2  Feeling bad or failure about yourself  1  Trouble concentrating 2  Moving slowly or fidgety/restless 2  Suicidal thoughts 0  PHQ-9 Score 17  Difficult doing work/chores Not difficult at all  Some  recent data might be hidden   Subjective:   1. SOB (shortness of breath) on exertion Bethany Bethany [name]'Bethany Bethany [name] [employer] Expenditure was 1757 today. On 10/2019 it was 1508.Bethany [name] notes increasing shortness of breath with exercising and seems to be worsening over time with weight gain. She notes getting out of breath sooner with activity than she used to. This has not gotten worse recently. Bethany [name] denies shortness of breath at rest or orthopnea.   2. Other fatigue Bethany Bethany [name]'Bethany Bethany [name] [employer] Expenditure was 1757. On 10/2019 it was 1508.Bethany [name] reports daytime somnolence and reports waking up still tired. Patient has Bethany [name] history of symptoms of morning fatigue. Bethany [name] generally gets  6-8  hours of sleep per night, and states that she has difficulty falling back asleep if awakened. Snoring is present. Apneic episodes are not present. Epworth Sleepiness Score is 14.    3. Essential hypertension Bethany Bethany [name] is currently taking Losartan/HCTZ. Her blood pressure was elevated 143/93. She did not take her blood pressure this morning.   4. Pre-diabetes Bethany Bethany [name] is currently not on medications.   5. Hyperlipidemia Bethany Bethany [name] is not on medications currently.   6. Vitamin D deficiency Bethany Bethany [name] is taking Vitamin D currently.  Her last Vitamin D level was 18.3.  7. Vitamin B 12 deficiency Bethany Bethany [name] take injections once Bethany [name] week. Her last B 12 was in 130 04/08/2021.  8. Other specified hypothyroidism Bethany Bethany [name]'Bethany Bethany [name] last TSH level was 65.05.  9.  Polyphagia Bethany Bethany [name] has tried Metformin and Qsymia. She notes hunger in the evening.   Assessment/Plan:   1. SOB (shortness of breath) on exertion Bethany Bethany [name]  will gradually increase activities. Bethany Bethany [name] does feel that she gets out of breath more easily that she used to when she exercises. Bethany Bethany [name]'Bethany Bethany [name] shortness of breath appears to be obesity related and exercise induced. She has agreed to work on weight loss and gradually increase exercise to treat her exercise induced shortness of breath. Will continue to monitor  closely.   2. Other fatigue Bethany Bethany [name] will gradually increase activities. Bethany Bethany [name] does feel that her weight is causing her energy to be lower than it should be. Fatigue may be related to obesity, depression or many other causes. Labs will be ordered, and in the meanwhile, Bethany [name] will focus on self care including making healthy food choices, increasing physical activity and focusing on stress reduction.   3. Essential hypertension Bethany [name] will continue Losartan/HCTZ. She is working on healthy weight loss and exercise to improve blood pressure control. We will watch for signs of hypotension as she continues her lifestyle modifications.  4. Pre-diabetes Bethany Bethany [name] will minimize sweets and carbohydrates. We will check A1C, insulin and CMP. She will continue to work on weight loss, exercise, and decreasing simple carbohydrates to help decrease the risk of diabetes.   - Insulin, random - Hemoglobin A1c - Comprehensive metabolic panel  5. Hyperlipidemia Cardiovascular risk and specific lipid/LDL goals reviewed.  We discussed several lifestyle modifications today and Bethany Bethany [name] will continue to work on diet, exercise and weight loss efforts. Orders and follow up as documented in patient record.   Counseling Intensive lifestyle modifications are the first line treatment for this issue. Dietary changes: Increase soluble fiber. Decrease simple carbohydrates. Exercise changes: Moderate to vigorous-intensity aerobic activity 150 minutes per week if tolerated. Lipid-lowering medications: see documented in medical record.  6. Vitamin D deficiency Low Vitamin D level contributes to fatigue and are associated with obesity, breast, and colon cancer. Bethany Bethany [name] agrees to continue to take prescription Vitamin D and she will follow-up for routine testing of Vitamin D, at least 2-3 times per year to avoid over-replacement.  7. Vitamin B 12 deficiency The diagnosis was reviewed with the patient. Bethany Bethany [name] will continue B 12  injections. Counseling provided today, see below. We will continue to monitor. Orders and follow up as documented in patient record.  Counseling The body needs vitamin B12: to make red blood cells; to make DNA; and to help the nerves work properly so they can carry messages from the brain to the body.  The main causes of vitamin B12 deficiency include dietary deficiency, digestive diseases, pernicious anemia, and having Bethany [name] surgery in which part of the stomach or small intestine is removed.  Certain medicines can make it harder for the body to absorb vitamin B12. These medicines include: heartburn medications; some antibiotics; some medications used to treat diabetes, gout, and high cholesterol.  In some cases, there are no symptoms of this condition. If the condition leads to anemia or nerve damage, various symptoms can occur, such as weakness or fatigue, shortness of breath, and numbness or tingling in your hands and feet.   Treatment:  May include taking vitamin B12 supplements.  Avoid alcohol.  Eat lots of healthy foods that contain vitamin B12: Beef, pork, chicken, [place], and organ meats, such as liver.  Seafood: This includes clams, rainbow trout, salmon, tuna, and haddock. Eggs.  Cereal and dairy products that are fortified: This means that vitamin B12 has been added to the food.    8. Other specified hypothyroidism Sari will follow up with endocrinologist. Her dose was changed at last visit. Orders and follow  up as documented in patient record.  Counseling Good thyroid control is important for overall health. Supratherapeutic thyroid levels are dangerous and will not improve weight loss results. Counseling: The correct way to take levothyroxine is fasting, with water, separated by at least 30 minutes from breakfast, and separated by more than 4 hours from calcium, iron, multivitamins, acid reflux medications (PPIs).    9. Depression screen [place] had Bethany [name] positive depression screening.  Depression is commonly associated with obesity and often results in emotional eating behaviors. We will monitor this closely and work on CBT to help improve the non-hunger eating patterns. Referral to Psychology may be required if no improvement is seen as she continues in our clinic.   10. Polyphagia  11. Class 1 obesity with serious comorbidity and body mass index (BMI) of 32.0 to 32.9 in adult, unspecified obesity type Jaslyn is currently in the action stage of change and her goal is to continue with weight loss efforts. I recommend Cathyann begin the structured treatment plan as follows: She has agreed to the Category 2 Plan and the [place].  Rufina will continue meal planning and she will continue intentional eating. We reviewed labs from 02/21/2021 BMP, Lipid panel and 04/08/2021 Vitamin D and TSH.   Exercise goals:  Serrita started walking again.    Behavioral modification strategies: increasing lean protein intake, decreasing simple carbohydrates, increasing vegetables, increasing water intake, decreasing eating out, no skipping meals, meal planning and cooking strategies, keeping healthy foods in the home, and planning for success.  She was informed of the importance of frequent follow-up visits to maximize her success with intensive lifestyle modifications for her multiple health conditions. She was informed we would discuss her lab results at her next visit unless there is Bethany [name] critical issue that needs to be addressed sooner. Socorro agreed to keep her next visit at the agreed upon time to discuss these results.  Objective:   Blood pressure (!) 143/93, pulse 88, temperature 98.1 F (36.7 C), height '5\' 6"'$  (1.676 m), weight 203 lb (92.1 kg), SpO2 97 %. Body mass index is 32.77 kg/m.  EKG: Normal sinus rhythm, rate 62 bpm.  Indirect Calorimeter completed today shows Bethany [name] VO2 of 255 and Bethany [name] REE of 1757.  Her calculated basal metabolic rate is 1191 thus her basal metabolic rate is better  than expected.  General: Cooperative, alert, well developed, in no acute distress. HEENT: Conjunctivae and lids unremarkable. Cardiovascular: Regular rhythm.  Lungs: Normal work of breathing. Neurologic: No focal deficits.   Lab Results  Component Value Date   CREATININE 0.79 05/22/2021   BUN 13 05/22/2021   NA 140 05/22/2021   K 4.8 05/22/2021   CL 102 05/22/2021   CO2 22 05/22/2021   Lab Results  Component Value Date   ALT 25 05/22/2021   AST 21 05/22/2021   ALKPHOS 98 05/22/2021   BILITOT 0.3 05/22/2021   Lab Results  Component Value Date   HGBA1C 5.7 (H) 05/22/2021   HGBA1C 5.7 (H) 02/28/2020   HGBA1C 5.7 (H) 10/16/2019   HGBA1C 5.7 (H) 06/19/2014   Lab Results  Component Value Date   INSULIN 15.5 05/22/2021   INSULIN 10.5 02/28/2020   INSULIN 11.1 10/16/2019   Lab Results  Component Value Date   TSH 1.020 10/16/2019   Lab Results  Component Value Date   CHOL 199 02/21/2021   HDL 77 02/21/2021   LDLCALC 111 (H) 02/21/2021   TRIG 62 02/21/2021   CHOLHDL 2.6 02/21/2021  Lab Results  Component Value Date   WBC 9.7 02/13/2021   HGB 13.0 02/13/2021   HCT 40.6 02/13/2021   MCV 91.4 02/13/2021   PLT 290 02/13/2021   No results found for: IRON, TIBC, FERRITIN  Attestation Statements:   Reviewed by clinician on day of visit: allergies, medications, problem list, medical history, surgical history, family history, social history, and previous encounter notes.  I, Lizbeth Bark, RMA, am acting as Location manager for [employer], DO.  I have reviewed the above documentation for accuracy and completeness, and I agree with the above. Jearld Lesch, DO

## 2021-05-27 ENCOUNTER — Encounter: Payer: Self-pay | Admitting: Hematology and Oncology

## 2021-05-27 ENCOUNTER — Encounter: Payer: Self-pay | Admitting: Cardiovascular Disease

## 2021-05-27 DIAGNOSIS — Z79899 Other long term (current) drug therapy: Secondary | ICD-10-CM

## 2021-05-28 MED ORDER — LOSARTAN POTASSIUM-HCTZ 100-25 MG PO TABS: 1.0000 | ORAL_TABLET | Freq: Every day | ORAL | 3 refills | Status: DC

## 2021-05-29 ENCOUNTER — Ambulatory Visit (INDEPENDENT_AMBULATORY_CARE_PROVIDER_SITE_OTHER): Payer: 59 | Admitting: Bariatrics

## 2021-05-30 ENCOUNTER — Telehealth: Payer: Self-pay | Admitting: Hematology and Oncology

## 2021-05-30 NOTE — Telephone Encounter (Signed)
.  Called patient to schedule appointment per 3/13 inb, patient is aware of date and time.   ?

## 2021-06-03 NOTE — Telephone Encounter (Signed)
Error

## 2021-06-05 ENCOUNTER — Ambulatory Visit: Payer: 59 | Admitting: Hematology and Oncology

## 2021-06-05 ENCOUNTER — Other Ambulatory Visit: Payer: 59

## 2021-06-05 ENCOUNTER — Ambulatory Visit (INDEPENDENT_AMBULATORY_CARE_PROVIDER_SITE_OTHER): Payer: Self-pay | Admitting: Bariatrics

## 2021-06-06 ENCOUNTER — Other Ambulatory Visit: Payer: Self-pay | Admitting: *Deleted

## 2021-06-06 ENCOUNTER — Encounter: Payer: Self-pay | Admitting: Cardiovascular Disease

## 2021-06-06 ENCOUNTER — Other Ambulatory Visit: Payer: Self-pay

## 2021-06-06 DIAGNOSIS — I10 Essential (primary) hypertension: Secondary | ICD-10-CM

## 2021-06-06 DIAGNOSIS — Z79899 Other long term (current) drug therapy: Secondary | ICD-10-CM

## 2021-06-10 ENCOUNTER — Encounter (INDEPENDENT_AMBULATORY_CARE_PROVIDER_SITE_OTHER): Payer: Self-pay | Admitting: Bariatrics

## 2021-06-10 ENCOUNTER — Ambulatory Visit (INDEPENDENT_AMBULATORY_CARE_PROVIDER_SITE_OTHER): Payer: Self-pay | Admitting: Bariatrics

## 2021-06-10 ENCOUNTER — Other Ambulatory Visit: Payer: Self-pay

## 2021-06-10 VITALS — BP 140/82 | HR 75 | Temp 98.2°F | Ht 66.0 in | Wt 204.0 lb

## 2021-06-10 DIAGNOSIS — Z6833 Body mass index (BMI) 33.0-33.9, adult: Secondary | ICD-10-CM

## 2021-06-10 DIAGNOSIS — I10 Essential (primary) hypertension: Secondary | ICD-10-CM

## 2021-06-10 DIAGNOSIS — E669 Obesity, unspecified: Secondary | ICD-10-CM

## 2021-06-10 DIAGNOSIS — R7303 Prediabetes: Secondary | ICD-10-CM

## 2021-06-10 DIAGNOSIS — F5089 Other specified eating disorder: Secondary | ICD-10-CM

## 2021-06-10 MED ORDER — TOPIRAMATE 50 MG PO TABS: 50.0000 mg | ORAL_TABLET | Freq: Every day | ORAL | 0 refills | Status: DC

## 2021-06-11 ENCOUNTER — Encounter (INDEPENDENT_AMBULATORY_CARE_PROVIDER_SITE_OTHER): Payer: Self-pay | Admitting: Bariatrics

## 2021-06-11 NOTE — Progress Notes (Signed)
? ? ? ?Chief Complaint:  ? ?OBESITY ?Bethany [name] is here to discuss her progress with her obesity treatment plan along with follow-up of her obesity related diagnoses. Bethany [name] is on the Category 2 Plan and states she is following her eating plan approximately 50% of the time. Bethany [name] states she is doing 0 minutes 0 times per week. ? ?Today's visit was #: 2 ?Starting weight: 203 lbs ?Starting date: 05/22/2021 ?Today's weight: 204 lbs ?Today's date: 06/11/2021 ?Total lbs lost to date: 0 ?Total lbs lost since last in-office visit: 0 ? ?Interim History: Bethany [name] is up 1 lb since her last visit.  ? ?Subjective:  ? ?1. Pre-diabetes ?Bethany [name] is not on medications currently. Her last A1C was 5.7. Her insulin was 15.5. ? ?2. Essential hypertension ?Bethany [name] is currently taking Hyzaar. Her blood pressure is controlled. Her last blood pressure was 143/93. ? ?3. Other disorder of eating ?Bethany [name] is taking Effexor currently. She denies having allergies.  ? ?Assessment/Plan:  ? ?1. Pre-diabetes ?Bethany [name] will minimize her carbohydrates (sweets and starches). She will continue to work on weight loss, exercise, and decreasing simple carbohydrates to help decrease the risk of diabetes.  ? ?2. Essential hypertension ?Bethany [name] will continue taking Hyzaar. She is working on healthy weight loss and exercise to improve blood pressure control. We will watch for signs of hypotension as she continues her lifestyle modifications. ? ?3. Other disorder of eating ?We will refill Topamax 50 mg for 1 month with no refills. Behavior modification techniques were discussed today to help Bethany [name] deal with her emotional/non-hunger eating behaviors.  Orders and follow up as documented in patient record.  ? ?- topiramate (TOPAMAX) 50 MG tablet; Take 1 tablet (50 mg total) by mouth daily with supper.  Dispense: 30 tablet; Refill: 0 ? ?4. Obesity, current BMI 33.0 ?Bethany [name] is currently in the action stage of change. As such, her goal is to continue with weight loss efforts.  She has agreed to the Category 2 Plan, keeping a food journal and adhering to recommended goals of 1200 calories and 80 grams of protein, and the [place].  ? ?Bethany [name] will adhere closely to the plan.She will have no sugary drinks. We reviewed labs from 05/22/2021 CMP, Lipids, A1C, and insulin.  ? ?Exercise goals: No exercise has been prescribed at this time. ? ?Behavioral modification strategies: increasing lean protein intake, decreasing simple carbohydrates, increasing vegetables, increasing water intake, decreasing eating out, no skipping meals, meal planning and cooking strategies, keeping healthy foods in the home, and planning for success. ? ?Bethany [name] has agreed to follow-up with our clinic in 2 weeks with Jake Bathe, FNP or Everardo Pacific, FNP and 4 weeks with myself. She was informed of the importance of frequent follow-up visits to maximize her success with intensive lifestyle modifications for her multiple health conditions.  ? ?Objective:  ? ?Blood pressure 140/82, pulse 75, temperature 98.2 ?F (36.8 ?C), height '5\' 6"'$  (1.676 m), weight 204 lb (92.5 kg), SpO2 95 %. ?Body mass index is 32.93 kg/m?. ? ?General: Cooperative, alert, well developed, in no acute distress. ?HEENT: Conjunctivae and lids unremarkable. ?Cardiovascular: Regular rhythm.  ?Lungs: Normal work of breathing. ?Neurologic: No focal deficits.  ? ?Lab Results  ?Component Value Date  ? CREATININE 0.79 05/22/2021  ? BUN 13 05/22/2021  ? NA 140 05/22/2021  ? K 4.8 05/22/2021  ? CL 102 05/22/2021  ? CO2 22 05/22/2021  ? ?Lab Results  ?Component Value Date  ? ALT 25 05/22/2021  ? AST 21 05/22/2021  ? ALKPHOS  98 05/22/2021  ? BILITOT 0.3 05/22/2021  ? ?Lab Results  ?Component Value Date  ? HGBA1C 5.7 (H) 05/22/2021  ? HGBA1C 5.7 (H) 02/28/2020  ? HGBA1C 5.7 (H) 10/16/2019  ? HGBA1C 5.7 (H) 06/19/2014  ? ?Lab Results  ?Component Value Date  ? INSULIN 15.5 05/22/2021  ? INSULIN 10.5 02/28/2020  ? INSULIN 11.1 10/16/2019  ? ?Lab Results   ?Component Value Date  ? TSH 1.020 10/16/2019  ? ?Lab Results  ?Component Value Date  ? CHOL 199 02/21/2021  ? HDL 77 02/21/2021  ? LDLCALC 111 (H) 02/21/2021  ? TRIG 62 02/21/2021  ? CHOLHDL 2.6 02/21/2021  ? ?Lab Results  ?Component Value Date  ? VD25OH 32.7 02/28/2020  ? VD25OH 28.1 (L) 10/16/2019  ? ?Lab Results  ?Component Value Date  ? WBC 9.7 02/13/2021  ? HGB 13.0 02/13/2021  ? HCT 40.6 02/13/2021  ? MCV 91.4 02/13/2021  ? PLT 290 02/13/2021  ? ?No results found for: IRON, TIBC, FERRITIN ? ?Attestation Statements:  ? ?Reviewed by clinician on day of visit: allergies, medications, problem list, medical history, surgical history, family history, social history, and previous encounter notes. ? ?I, Lizbeth Bark, RMA, am acting as transcriptionist for [employer], DO. ? ?I have reviewed the above documentation for accuracy and completeness, and I agree with the above. Jearld Lesch, DO ? ?

## 2021-06-12 ENCOUNTER — Inpatient Hospital Stay: Payer: Self-pay | Admitting: Hematology and Oncology

## 2021-06-12 ENCOUNTER — Inpatient Hospital Stay: Payer: Self-pay

## 2021-06-14 ENCOUNTER — Other Ambulatory Visit (INDEPENDENT_AMBULATORY_CARE_PROVIDER_SITE_OTHER): Payer: Self-pay | Admitting: Bariatrics

## 2021-06-14 DIAGNOSIS — F5089 Other specified eating disorder: Secondary | ICD-10-CM

## 2021-06-16 ENCOUNTER — Ambulatory Visit (HOSPITAL_COMMUNITY): Payer: Self-pay | Attending: Cardiology

## 2021-06-17 ENCOUNTER — Other Ambulatory Visit: Payer: Self-pay

## 2021-06-17 ENCOUNTER — Telehealth: Payer: Self-pay

## 2021-06-17 DIAGNOSIS — E785 Hyperlipidemia, unspecified: Secondary | ICD-10-CM

## 2021-06-17 DIAGNOSIS — I200 Unstable angina: Secondary | ICD-10-CM

## 2021-06-17 NOTE — Telephone Encounter (Signed)
-----   Message from Ramond Dial, [place] sent at 06/17/2021  7:06 AM EDT ----- ?Please set up lipids and apoB. thanks ?----- Message ----- ?From: Ramond Dial, RPH-CPP ?Sent: 06/17/2021  12:00 AM EDT ?To: Ramond Dial, RPH-CPP ? ?Set up lipids ? ? ?

## 2021-06-17 NOTE — Telephone Encounter (Signed)
Called and lmom the pt that we need to schedule labs fasting but she seems to be scheduled for non fasting labs 4/10 and I would like to coordinate them all together so she only has to come once. I will route this msg to myself to continue calling pt a couple more times.  ?

## 2021-06-18 ENCOUNTER — Telehealth: Payer: Self-pay

## 2021-06-18 NOTE — Telephone Encounter (Signed)
Please call regarding labs.  She was returning Bethany [name]'s call.  Thank you ?

## 2021-06-19 ENCOUNTER — Encounter: Payer: Self-pay | Admitting: Pharmacist

## 2021-06-19 NOTE — Telephone Encounter (Signed)
Addressing in another encounter please disregard this one ?

## 2021-06-19 NOTE — Telephone Encounter (Signed)
2nd attempt unsuccessful as voice mail full  ?

## 2021-06-23 ENCOUNTER — Other Ambulatory Visit: Payer: Self-pay

## 2021-06-23 ENCOUNTER — Telehealth (HOSPITAL_COMMUNITY): Payer: Self-pay | Admitting: Cardiovascular Disease

## 2021-06-23 NOTE — Telephone Encounter (Signed)
Patient cancelled echocardiogram for reason below: ? ?06/16/21 Patient cancelled after her appt. She is sick. She will CB to reschedule EVD Patient no showed for appt. evd  ?04/30/21 patient cancelled x 3 and states pending ins til March 2023. SHe will call back then to reschedule/LBW 11:23 ? ?Order will be removed from the echo WQ and if patient calls back to reschedule we will reinstate order.  ?

## 2021-06-23 NOTE — Progress Notes (Signed)
?TeleHealth Visit:  ?Due to the COVID-19 pandemic, this visit was completed with telemedicine (audio/video) technology to reduce patient and provider exposure as well as to preserve personal protective equipment.  ? ?Bethany [name] has verbally consented to this TeleHealth visit. The patient is located at home, the provider is located at home. The participants in this visit include the listed provider and patient. The visit was conducted today via MyChart video. ? ?OBESITY ?Bethany [name] is here to discuss her progress with her obesity treatment plan along with follow-up of her obesity related diagnoses.  ? ?Today's visit was # 3 ?Starting weight: 203 lbs ?Starting date: 05/22/21 ?Total weight loss: 0 lbs ?Weight at last in office visit: 204 lbs on 06/11/21 ?Today's reported weight: 204 lbs  ? ?Nutrition Plan: the Category 2 Plan, keeping a food journal and adhering to recommended goals of 1200 calories and 80 protein, or the Pescatarian Plan.  ?Hunger is well controlled. Cravings are poorly controlled.  ?Current exercise: walking 3 days week for 20-30 minutes. ? ?Interim History: Bethany [name] originally started at our clinic in August 2021 and weight 198 pounds.  She attended our clinic through December 2021 and gained 296 pounds during the course of treatment.  She restarted our plan on May 22, 2021 at 203 pounds. ?Today she weighed 204 pounds. ?She does not feel she is following the plan well.  She reports the biggest challenge to plan adherence is the impulse to eat at night.  She does not feel she has excessive hunger during the day.  She reports that her water intake is not good currently. ?She says that she is mostly eating a low-carb diet. ?She purchases of insurance plan from [place] and she does not think it covers anti-obesity medications. ? ?Assessment/Plan:  ?1. Other disorder of eating/emotional eating  ?She was started on Topamax 50 mg at last office visit but it caused nausea and did not help with her cravings so  she discontinued it.  She is open to trying something else.  She has been on Contrave in the past but did not feel that it helped much.  She reports she has ADHD tendencies.  Denies history of glaucoma or seizures. ? ?Plan: ?Discontinue Topamax. ?Start bupropion 150 mg daily in the morning. ? ?2. Prediabetes ?Bethany [name] has a diagnosis of prediabetes based on her elevated HgA1c. ?She admits to polyphagia at night. ?Medication(s): none ?Lab Results  ?Component Value Date  ? HGBA1C 5.7 (H) 05/22/2021  ? ?Lab Results  ?Component Value Date  ? INSULIN 15.5 05/22/2021  ? INSULIN 10.5 02/28/2020  ? INSULIN 11.1 10/16/2019  ? ? ?Plan: ?Continue to maximize protein and limit intake of simple carbohydrates. ?We will consider trying to get GLP 1 coverage at future office visit. ? ?3. Obesity: Current BMI 32.94 ?Bethany [name] is currently in the action stage of change. As such, her goal is to continue with weight loss efforts.  ?She has agreed to following a lower carbohydrate, vegetable and lean protein rich diet plan.  ? ?Exercise goals: Continue walking 3 days/week. ? ?Behavioral modification strategies: increasing lean protein intake. ? ?Bethany [name] has agreed to follow-up with our clinic in 3 weeks.  ? ?No orders of the defined types were placed in this encounter. ? ? ?There are no discontinued medications.  ? ?No orders of the defined types were placed in this encounter. ?   ? ?Objective:  ? ?VITALS: Per patient if applicable, see vitals. ?GENERAL: Alert and in no acute distress. ?CARDIOPULMONARY: No increased WOB. Speaking in  clear sentences.  ?PSYCH: Pleasant and cooperative. Speech normal rate and rhythm. Affect is appropriate. Insight and judgement are appropriate. Attention is focused, linear, and appropriate.  ?NEURO: Oriented as arrived to appointment on time with no prompting.  ? ?Lab Results  ?Component Value Date  ? CREATININE 0.79 05/22/2021  ? BUN 13 05/22/2021  ? NA 140 05/22/2021  ? K 4.8 05/22/2021  ? CL 102 05/22/2021  ?  CO2 22 05/22/2021  ? ?Lab Results  ?Component Value Date  ? ALT 25 05/22/2021  ? AST 21 05/22/2021  ? ALKPHOS 98 05/22/2021  ? BILITOT 0.3 05/22/2021  ? ?Lab Results  ?Component Value Date  ? HGBA1C 5.7 (H) 05/22/2021  ? HGBA1C 5.7 (H) 02/28/2020  ? HGBA1C 5.7 (H) 10/16/2019  ? HGBA1C 5.7 (H) 06/19/2014  ? ?Lab Results  ?Component Value Date  ? INSULIN 15.5 05/22/2021  ? INSULIN 10.5 02/28/2020  ? INSULIN 11.1 10/16/2019  ? ?Lab Results  ?Component Value Date  ? TSH 1.020 10/16/2019  ? ?Lab Results  ?Component Value Date  ? CHOL 199 02/21/2021  ? HDL 77 02/21/2021  ? LDLCALC 111 (H) 02/21/2021  ? TRIG 62 02/21/2021  ? CHOLHDL 2.6 02/21/2021  ? ?Lab Results  ?Component Value Date  ? WBC 9.7 02/13/2021  ? HGB 13.0 02/13/2021  ? HCT 40.6 02/13/2021  ? MCV 91.4 02/13/2021  ? PLT 290 02/13/2021  ? ?No results found for: IRON, TIBC, FERRITIN ?Lab Results  ?Component Value Date  ? VD25OH 32.7 02/28/2020  ? VD25OH 28.1 (L) 10/16/2019  ? ? ?Attestation Statements:  ? ?Reviewed by clinician on day of visit: allergies, medications, problem list, medical history, surgical history, family history, social history, and previous encounter notes. ? ? ?

## 2021-06-24 ENCOUNTER — Encounter (INDEPENDENT_AMBULATORY_CARE_PROVIDER_SITE_OTHER): Payer: Self-pay | Admitting: Family Medicine

## 2021-06-24 ENCOUNTER — Telehealth (INDEPENDENT_AMBULATORY_CARE_PROVIDER_SITE_OTHER): Payer: Self-pay | Admitting: Family Medicine

## 2021-06-24 DIAGNOSIS — F5089 Other specified eating disorder: Secondary | ICD-10-CM

## 2021-06-24 DIAGNOSIS — R7303 Prediabetes: Secondary | ICD-10-CM

## 2021-06-24 DIAGNOSIS — E669 Obesity, unspecified: Secondary | ICD-10-CM

## 2021-06-24 DIAGNOSIS — Z6832 Body mass index (BMI) 32.0-32.9, adult: Secondary | ICD-10-CM

## 2021-06-24 MED ORDER — BUPROPION HCL ER (SR) 150 MG PO TB12
150.0000 mg | ORAL_TABLET | Freq: Every morning | ORAL | 0 refills | Status: DC
Start: 2021-06-24 — End: 2021-07-24

## 2021-06-25 ENCOUNTER — Other Ambulatory Visit: Payer: Self-pay

## 2021-06-25 DIAGNOSIS — Z170 Estrogen receptor positive status [ER+]: Secondary | ICD-10-CM

## 2021-06-25 NOTE — Telephone Encounter (Signed)
Looks like Marcelle Overlie was able to successfully get the pt scheduled ?

## 2021-06-26 ENCOUNTER — Inpatient Hospital Stay (HOSPITAL_BASED_OUTPATIENT_CLINIC_OR_DEPARTMENT_OTHER): Payer: Managed Care, Other (non HMO) | Admitting: Hematology and Oncology

## 2021-06-26 ENCOUNTER — Other Ambulatory Visit: Payer: Self-pay

## 2021-06-26 ENCOUNTER — Inpatient Hospital Stay: Payer: Managed Care, Other (non HMO) | Attending: Hematology and Oncology

## 2021-06-26 ENCOUNTER — Other Ambulatory Visit: Payer: Self-pay | Admitting: *Deleted

## 2021-06-26 VITALS — BP 149/88 | HR 84 | Temp 97.5°F | Resp 18 | Ht 66.0 in | Wt 207.9 lb

## 2021-06-26 DIAGNOSIS — Z801 Family history of malignant neoplasm of trachea, bronchus and lung: Secondary | ICD-10-CM | POA: Insufficient documentation

## 2021-06-26 DIAGNOSIS — R55 Syncope and collapse: Secondary | ICD-10-CM | POA: Diagnosis not present

## 2021-06-26 DIAGNOSIS — Z7982 Long term (current) use of aspirin: Secondary | ICD-10-CM | POA: Diagnosis not present

## 2021-06-26 DIAGNOSIS — Z79899 Other long term (current) drug therapy: Secondary | ICD-10-CM | POA: Diagnosis not present

## 2021-06-26 DIAGNOSIS — Z800 Family history of malignant neoplasm of digestive organs: Secondary | ICD-10-CM | POA: Diagnosis not present

## 2021-06-26 DIAGNOSIS — J449 Chronic obstructive pulmonary disease, unspecified: Secondary | ICD-10-CM | POA: Insufficient documentation

## 2021-06-26 DIAGNOSIS — Z923 Personal history of irradiation: Secondary | ICD-10-CM | POA: Insufficient documentation

## 2021-06-26 DIAGNOSIS — C50412 Malignant neoplasm of upper-outer quadrant of left female breast: Secondary | ICD-10-CM | POA: Diagnosis present

## 2021-06-26 DIAGNOSIS — E039 Hypothyroidism, unspecified: Secondary | ICD-10-CM | POA: Insufficient documentation

## 2021-06-26 DIAGNOSIS — R591 Generalized enlarged lymph nodes: Secondary | ICD-10-CM

## 2021-06-26 DIAGNOSIS — Z87891 Personal history of nicotine dependence: Secondary | ICD-10-CM | POA: Insufficient documentation

## 2021-06-26 DIAGNOSIS — I341 Nonrheumatic mitral (valve) prolapse: Secondary | ICD-10-CM | POA: Insufficient documentation

## 2021-06-26 DIAGNOSIS — Z170 Estrogen receptor positive status [ER+]: Secondary | ICD-10-CM | POA: Insufficient documentation

## 2021-06-26 DIAGNOSIS — E785 Hyperlipidemia, unspecified: Secondary | ICD-10-CM | POA: Diagnosis not present

## 2021-06-26 DIAGNOSIS — Z79890 Hormone replacement therapy (postmenopausal): Secondary | ICD-10-CM | POA: Insufficient documentation

## 2021-06-26 DIAGNOSIS — I2510 Atherosclerotic heart disease of native coronary artery without angina pectoris: Secondary | ICD-10-CM | POA: Diagnosis not present

## 2021-06-26 DIAGNOSIS — Z85850 Personal history of malignant neoplasm of thyroid: Secondary | ICD-10-CM | POA: Insufficient documentation

## 2021-06-26 DIAGNOSIS — E538 Deficiency of other specified B group vitamins: Secondary | ICD-10-CM | POA: Insufficient documentation

## 2021-06-26 LAB — CMP (CANCER CENTER ONLY)
ALT: 30 U/L (ref 0–44)
AST: 26 U/L (ref 15–41)
Albumin: 4 g/dL (ref 3.5–5.0)
Alkaline Phosphatase: 72 U/L (ref 38–126)
Anion gap: 5 (ref 5–15)
BUN: 15 mg/dL (ref 6–20)
CO2: 30 mmol/L (ref 22–32)
Calcium: 9 mg/dL (ref 8.9–10.3)
Chloride: 103 mmol/L (ref 98–111)
Creatinine: 0.86 mg/dL (ref 0.44–1.00)
GFR, Estimated: >60 mL/min (ref >60)
Glucose, Bld: 91 mg/dL (ref 70–99)
Potassium: 3.9 mmol/L (ref 3.5–5.1)
Sodium: 138 mmol/L (ref 135–145)
Total Bilirubin: 0.4 mg/dL (ref 0.3–1.2)
Total Protein: 7 g/dL (ref 6.5–8.1)

## 2021-06-26 LAB — CBC WITH DIFFERENTIAL (CANCER CENTER ONLY)
Abs Immature Granulocytes: 0.01 10*3/uL (ref 0.00–0.07)
Basophils Absolute: 0 10*3/uL (ref 0.0–0.1)
Basophils Relative: 1 %
Eosinophils Absolute: 0.1 10*3/uL (ref 0.0–0.5)
Eosinophils Relative: 2 %
HCT: 38.8 % (ref 36.0–46.0)
Hemoglobin: 12.5 g/dL (ref 12.0–15.0)
Immature Granulocytes: 0 %
Lymphocytes Relative: 35 %
Lymphs Abs: 1.9 10*3/uL (ref 0.7–4.0)
MCH: 29.3 pg (ref 26.0–34.0)
MCHC: 32.2 g/dL (ref 30.0–36.0)
MCV: 90.9 fL (ref 80.0–100.0)
Monocytes Absolute: 0.4 10*3/uL (ref 0.1–1.0)
Monocytes Relative: 8 %
Neutro Abs: 3 10*3/uL (ref 1.7–7.7)
Neutrophils Relative %: 54 %
Platelet Count: 294 10*3/uL (ref 150–400)
RBC: 4.27 MIL/uL (ref 3.87–5.11)
RDW: 12.7 % (ref 11.5–15.5)
WBC Count: 5.6 10*3/uL (ref 4.0–10.5)
nRBC: 0 % (ref 0.0–0.2)

## 2021-06-26 NOTE — Progress Notes (Signed)
?[facility]  ?[phone] [phone]  ? ? ? ?ID: Bethany [name] DOB: 1960-11-23  MR#: [id number]  XNT#:[id number] ? ?Patient Care Team: ?Leeroy Cha, MD as PCP - General (Internal Medicine) ?Burnell Blanks, MD as PCP - Cardiology (Cardiology) ?Erroll Luna, MD as Consulting Physician (General Surgery) ?Magrinat, Virgie Dad, MD (Inactive) as Consulting Physician (Oncology) ?Kyung Rudd, MD as Consulting Physician (Radiation Oncology) ?Irene Shipper, MD as Consulting Physician (Gastroenterology) ?[name], MD as Consulting Physician (Obstetrics and Gynecology) ?Delrae Rend, MD as Consulting Physician (Endocrinology) ?Mauro Kaufmann, RN as Oncology Nurse Navigator ?Rockwell Germany, RN as Oncology Nurse Navigator ?Garner Nash, DO as Consulting Physician (Pulmonary Disease) ?Mcarthur Rossetti, MD as Consulting Physician (Orthopedic Surgery) ?Irene Limbo, MD as Consulting Physician (Plastic Surgery) ? ? ?CHIEF COMPLAINT: Estrogen receptor positive breast cancer ? ?CURRENT TREATMENT: Observation ? ?INTERVAL HISTORY: ? ?Bethany [name] returns today for follow-up of her estrogen receptor positive breast cancer. She is now under observation due to her inability to tolerate antiestrogens. ?She denies any complaints. No changes in breast. ?She denies any changes in breathing. ?No change in bowel habits ?Rest of the pertinent 10 point ROS reviewed and neg. ? ? COVID 19 VACCINATION STATUS: Status post Pfizer x2 as of November 2022 ? ?HISTORY OF CURRENT ILLNESS: ?The original intake note: ? ?"Bethany [name]" had routine screening mammography on 10/26/2017 showing a possible abnormality in the left breast. She underwent unilateral left diagnostic mammography with tomography and left breast ultrasonography at [facility] on 10/27/2017 showing: breast density category B. There was a hypoechoic lesion consistent of a mass at the 2:30 o'clock upper outer  quadrant middle depth and measuring 0.5 x 0.3 x 0.4 cm. Sonographic evaluation of the left axilla shows no enlarged or abnormal lymph nodes. ? ?An attempt was made to obtain a biopsy of this lesion on 11/03/2017, however the patient had repeated episodes of syncope during the attempted procedure. ? 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 Margins were negative for carcinoma. Prognostic indicators significant for: estrogen receptor, 90% positive and progesterone receptor, 80% positive, both with strong staining intensity. Proliferation marker Ki67 at 3%. HER2 negative with an immunohistochemistry of (1+). ? ?Then in a separate procedure she underwent left sentinel lymph node sampling on 12/09/2017 showing ([id number]): Four left axillary sentinel lymph were negative for carcinoma. (0/4).  ? ?Her subsequent history is as detailed below. ? ? ?PAST MEDICAL HISTORY: ?Past Medical History:  ?Diagnosis Date  ? ADD (attention deficit disorder)   ? Anxiety   ? B12 deficiency   ? Breast cancer ([place]) 2019  ? left breast cancer/diagnosed in 10/2017/taking radiation treatment until 03/15/18  ? Breast mass, left 11/2017  ? going thru radiation until 02/2018  ? CAD (coronary artery disease)   ? a. DES to LAD 06/2019.  ? Chest pain 06/19/2014  ? COPD (chronic obstructive pulmonary disease) ([facility])   ? beginning stages/small scar  ? Dental crowns present   ? Depression   ? Hiatal hernia   ? History of hiatal hernia   ? no current med.  ? History of thyroid cancer 12/15/2017  ? Hypertension   ? states under control with med., has been on med. x 1 yr.  ? Hypothyroidism   ? Malignant neoplasm of upper-outer quadrant of left breast in female, estrogen receptor positive ([place])   ? Mild hyperlipidemia   ? Mucocele of appendix 10/03/2015  ?  MVP (mitral valve prolapse)   ? not seen on echo 05/2019  ? Personal history of radiation therapy 2019  ? Left Breast  Cancer  ? PONV (postoperative nausea and vomiting)   ? Prediabetes   ? 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 Syncope and collapse 12/25/2008  ? Qualifier: Diagnosis of  By: Philemon Kingdom    ? Urinary incontinence   ? USI   ? Vitamin D deficiency   ?Thyroid cancer, GERD ? ? ?PAST SURGICAL HISTORY: ?Past Surgical History:  ?Procedure Laterality Date  ? ABDOMINAL HYSTERECTOMY    ? partial  ? APPENDECTOMY    ? AXILLARY SENTINEL NODE BIOPSY Left 12/09/2017  ? Procedure: AXILLARY SENTINEL NODE BIOPSY;  Surgeon: Erroll Luna, MD;  Location: [facility];  Service: General;  Laterality: Left;  ? BREAST EXCISIONAL BIOPSY Right 09/20/2019  ? BREAST LUMPECTOMY Left 11/2017  ? BREAST LUMPECTOMY WITH NEEDLE LOCALIZATION Left 11/25/2017  ? Procedure: LEFT BREAST LUMPECTOMY WITH NEEDLE LOCALIZATION;  Surgeon: Erroll Luna, MD;  Location: [facility];  Service: General;  Laterality: Left;  ? BREAST LUMPECTOMY WITH RADIOACTIVE SEED LOCALIZATION Right 09/20/2019  ? Procedure: RIGHT BREAST LUMPECTOMY WITH RADIOACTIVE SEED LOCALIZATION;  Surgeon: Erroll Luna, MD;  Location: [facility];  Service: General;  Laterality: Right;  ? COLONOSCOPY WITH PROPOFOL  10/03/2015  ? CORONARY STENT INTERVENTION N/A 06/29/2019  ? Procedure: CORONARY STENT INTERVENTION;  Surgeon: Burnell Blanks, MD;  Location: [facility];  Service: Cardiovascular;  Laterality: N/A;  ? LAPAROSCOPIC APPENDECTOMY N/A 10/03/2015  ? Procedure: APPENDECTOMY LAPAROSCOPIC;  Surgeon: Rolm Bookbinder, MD;  Location: [facility];  Service: General;  Laterality: N/A;  ? LEFT HEART CATH AND CORONARY ANGIOGRAPHY N/A 06/29/2019  ? Procedure: LEFT HEART CATH AND CORONARY ANGIOGRAPHY;  Surgeon: Burnell Blanks, MD;  Location: [facility];  Service: Cardiovascular;  Laterality: N/A;  ? TOTAL THYROIDECTOMY  04/02/2003  ?Hysterectomy without BSO ? ? ?FAMILY HISTORY ?Family History  ?Problem Relation Age of Onset   ? Colon polyps Mother   ? High Cholesterol Mother   ? Alcoholism Mother   ? Hypertension Father   ? Heart disease Father   ? Non-Hodgkin's lymphoma Father 60  ? Cancer Father   ? Depression Father   ? Anxiety disorder Father   ? Alcoholism Father   ? Hypertension Brother   ? Lung cancer Paternal Aunt   ?     d. 41  ? Throat cancer Maternal Grandfather   ?     d. 73  ? Throat cancer Paternal Grandmother   ?     dx < 39  ? Heart disease Paternal Grandfather   ? Colon cancer Other   ?     great maternal aunt   ? Stomach cancer Neg Hx   ? Rectal cancer Neg Hx   ? Esophageal cancer Neg Hx   ?The patient's father died in 2017/04/20 age age 29 due to non-Hodgkin's lymphoma. The patient's mother is alive at age 81 as of October 2019. The patient has 1 brother, no sisters. There was a paternal aunt with lung cancer. There was a paternal grandmother and maternal grandfather with throat cancer. She denies a family history of breast or ovarian cancer.  ? ? ?GYNECOLOGIC HISTORY:  ?No LMP recorded. Patient has had a hysterectomy. ?Menarche: 61 years old ?Age at first live birth: 61 years old ?She is GX P3.  ?She is status post partial hysterectomy (without  BSO) in her late 73's. She did not have HRT.  ? ? 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 Her husband, Patrick Jupiter, is a Clinical cytogeneticist for [employer]. At home is her, her husband, and their two dogs. The patient's oldest son, [place], has 3 children and lives in [place] and works in Nurse, children's for [employer]. The patient's son, Alpha Gula lives in [place] with 1 daughter and works as a Teacher, music. The patient's youngest son, Aline Brochure lives in [place] and works for [employer] in [place]. The patient has 5 grandchildren total. Her grandchildren are ages 80, 20, 70, 69, and 3. She notes the youngest has Down Syndrome and recently turned 3. She does not currently belong to a church, but she is [place].   ? ? ADVANCED DIRECTIVES: In the absence of any  documentation to the contrary, the patient's spouse is their HCPOA.  ? ? ?HEALTH MAINTENANCE: ?Social History  ? ?Tobacco Use  ? Smoking status: Former  ?  Types: Cigarettes  ?  Quit date: 08/14/2014  ?  Years since [place]

## 2021-06-27 ENCOUNTER — Encounter: Payer: Self-pay | Admitting: Hematology and Oncology

## 2021-06-30 ENCOUNTER — Ambulatory Visit (HOSPITAL_COMMUNITY)
Admission: RE | Admit: 2021-06-30 | Discharge: 2021-06-30 | Disposition: A | Discharge status: Home / Self Care | Payer: Commercial Managed Care - HMO | Source: Ambulatory Visit | Attending: Hematology and Oncology | Admitting: Hematology and Oncology

## 2021-06-30 ENCOUNTER — Other Ambulatory Visit: Payer: Self-pay | Admitting: Hematology and Oncology

## 2021-06-30 ENCOUNTER — Other Ambulatory Visit: Payer: Managed Care, Other (non HMO)

## 2021-06-30 ENCOUNTER — Other Ambulatory Visit: Payer: Self-pay | Admitting: Cardiovascular Disease

## 2021-06-30 DIAGNOSIS — R591 Generalized enlarged lymph nodes: Secondary | ICD-10-CM | POA: Diagnosis present

## 2021-06-30 DIAGNOSIS — Z79899 Other long term (current) drug therapy: Secondary | ICD-10-CM

## 2021-06-30 DIAGNOSIS — I10 Essential (primary) hypertension: Secondary | ICD-10-CM

## 2021-06-30 DIAGNOSIS — E785 Hyperlipidemia, unspecified: Secondary | ICD-10-CM

## 2021-06-30 DIAGNOSIS — I200 Unstable angina: Secondary | ICD-10-CM

## 2021-06-30 NOTE — Progress Notes (Signed)
MR neck ordered per radiology recommendations. ?

## 2021-07-01 ENCOUNTER — Encounter: Payer: Self-pay | Admitting: Hematology and Oncology

## 2021-07-01 ENCOUNTER — Other Ambulatory Visit: Payer: Self-pay | Admitting: Cardiovascular Disease

## 2021-07-01 ENCOUNTER — Encounter (INDEPENDENT_AMBULATORY_CARE_PROVIDER_SITE_OTHER): Payer: Self-pay | Admitting: Bariatrics

## 2021-07-01 ENCOUNTER — Other Ambulatory Visit: Payer: Self-pay

## 2021-07-01 ENCOUNTER — Telehealth: Payer: Self-pay | Admitting: *Deleted

## 2021-07-01 LAB — LIPID PANEL
Chol/HDL Ratio: 1.5 ratio (ref 0.0–4.4)
Cholesterol, Total: 128 mg/dL (ref 100–199)
HDL: 83 mg/dL (ref >39)
LDL Chol Calc (NIH): 33 mg/dL (ref 0–99)
Triglycerides: 54 mg/dL (ref 0–149)
VLDL Cholesterol Cal: 12 mg/dL (ref 5–40)

## 2021-07-01 LAB — BASIC METABOLIC PANEL
BUN/Creatinine Ratio: 18 (ref 9–23)
BUN: 15 mg/dL (ref 6–24)
CO2: 23 mmol/L (ref 20–29)
Calcium: 9.5 mg/dL (ref 8.7–10.2)
Chloride: 103 mmol/L (ref 96–106)
Creatinine, Ser: 0.85 mg/dL (ref 0.57–1.00)
Glucose: 99 mg/dL (ref 70–99)
Potassium: 4.4 mmol/L (ref 3.5–5.2)
Sodium: 142 mmol/L (ref 134–144)
eGFR: 79 mL/min/{1.73_m2} (ref >59)

## 2021-07-01 LAB — APOLIPOPROTEIN B: Apolipoprotein B: 36 mg/dL (ref <90)

## 2021-07-01 MED ORDER — PRALUENT 75 MG/ML ~~LOC~~ SOAJ: 1.0000 "pen " | SUBCUTANEOUS | 11 refills | Status: DC

## 2021-07-01 NOTE — Telephone Encounter (Addendum)
Contacted patient per MD request with message below. Patient verbalized understanding of information and states she will call [facility] to schedule. She stated she scheduled a breast MRI, earliest available was 5/8. Her appt with Dr. Chryl Heck is  5/2.  She will let office know when appt for Neck MRI is scheduled in case appt date needs to be moved. ? ? ?----- Message from Benay Pike, MD ----- ?Please let the patient know that the radiologist recommends MRI with contrast. ? ? ?

## 2021-07-02 NOTE — Telephone Encounter (Signed)
Please review

## 2021-07-03 ENCOUNTER — Encounter: Payer: Self-pay | Admitting: Obstetrics & Gynecology

## 2021-07-03 ENCOUNTER — Other Ambulatory Visit: Payer: Self-pay

## 2021-07-03 ENCOUNTER — Ambulatory Visit (INDEPENDENT_AMBULATORY_CARE_PROVIDER_SITE_OTHER): Payer: Managed Care, Other (non HMO) | Admitting: Obstetrics & Gynecology

## 2021-07-03 VITALS — BP 104/70 | HR 70 | Resp 16 | Ht 65.25 in | Wt 207.0 lb

## 2021-07-03 DIAGNOSIS — M810 Age-related osteoporosis without current pathological fracture: Secondary | ICD-10-CM

## 2021-07-03 DIAGNOSIS — Z170 Estrogen receptor positive status [ER+]: Secondary | ICD-10-CM

## 2021-07-03 DIAGNOSIS — Z90710 Acquired absence of both cervix and uterus: Secondary | ICD-10-CM | POA: Diagnosis not present

## 2021-07-03 DIAGNOSIS — C50412 Malignant neoplasm of upper-outer quadrant of left female breast: Secondary | ICD-10-CM

## 2021-07-03 DIAGNOSIS — Z01419 Encounter for gynecological examination (general) (routine) without abnormal findings: Secondary | ICD-10-CM | POA: Diagnosis not present

## 2021-07-03 DIAGNOSIS — Z780 Asymptomatic menopausal state: Secondary | ICD-10-CM

## 2021-07-03 NOTE — Progress Notes (Signed)
? ? ?Bethany [name] 10-Feb-1961 [id number] ? ? ?History:    61 y.o. G3P3L3 Married.  Grand-daughter 47 yo with Down Syndrome. ?  ?RP:  Established patient presenting for annual gyn exam  ?  ?HPI: S/P Total Hysterectomy.  Postmenopause with continued severe hot flashes and night sweats.  No pelvic pain. Pap Neg 04/2017. No h/o abnormal Pap. 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  Radiation therapy.  Didn't tolerated the Anti-Estrogen Rx.  Mammo 11/2020,  Rt Lumpectomy Benign Ductal Papilloma.  Urine and bowel movements normal.  Body mass index 34.18.  On a low-carb diet.  Mild to moderate physical activities. BD Osteopenia T-Score -1.7 in 01/2020. Health labs with family physician.  [place] 02/2018. ? ?Past medical history,surgical history, family history and social history were all reviewed and documented in the EPIC chart. ? ?Gynecologic History ?No LMP recorded. Patient has had a hysterectomy. ? ?Obstetric History ?OB History  ?Gravida Para Term Preterm AB Living  ?'4 3 3   1 3  '$ ?SAB IAB Ectopic Multiple Live Births  ?1       3  ?  ?# Outcome Date GA Lbr Len/2nd Weight Sex Delivery Anes PTL Lv  ?4 SAB           ?3 Term           ?2 Term           ?1 Term           ? ? ? ?ROS: A ROS was performed and pertinent positives and negatives are included in the history. ? GENERAL: No fevers or chills. HEENT: No change in vision, no earache, sore throat or sinus congestion. NECK: No pain or stiffness. CARDIOVASCULAR: No chest pain or pressure. No palpitations. PULMONARY: No shortness of breath, cough or wheeze. GASTROINTESTINAL: No abdominal pain, nausea, vomiting or diarrhea, melena or bright red blood per rectum. GENITOURINARY: No urinary frequency, urgency, hesitancy or dysuria. MUSCULOSKELETAL: No joint or muscle pain, no back pain, no recent trauma. DERMATOLOGIC: No rash, no itching, no lesions. ENDOCRINE: No polyuria, polydipsia, no heat or cold intolerance. No recent change in weight. HEMATOLOGICAL:  No anemia or easy bruising or bleeding. NEUROLOGIC: No headache, seizures, numbness, tingling or weakness. PSYCHIATRIC: No depression, no loss of interest in normal activity or change in sleep pattern.  ?  ? ?Exam: ? ? ?BP 104/70   Pulse 70   Resp 16   Ht 5' 5.25" (1.657 m)   Wt 207 lb (93.9 kg)   BMI 34.18 kg/m?  ? ?Body mass index is 34.18 kg/m?. ? ?General appearance : Well developed well nourished female. No acute distress ?HEENT: Eyes: no retinal hemorrhage or exudates,  Neck supple, trachea midline, no carotid bruits, no thyroidmegaly ?Lungs: Clear to auscultation, no rhonchi or wheezes, or rib retractions  ?Heart: Regular rate and rhythm, no murmurs or gallops ?Breast:Examined in sitting and supine position were symmetrical in appearance, no palpable masses or tenderness,  no skin retraction, no nipple inversion, no nipple discharge, no skin discoloration, no axillary or supraclavicular lymphadenopathy ?Abdomen: no palpable masses or tenderness, no rebound or guarding ?Extremities: no edema or skin discoloration or tenderness ? ?Pelvic: Vulva: Normal ?            Vagina: No gross lesions or discharge ? Cervix/Uterus absent ? Adnexa  Without masses or tenderness ? Anus: Normal ? ? ?Assessment/Plan:  61 y.o. female for annual exam  ? ?1. Well female exam with routine gynecological  exam ?S/P Total Hysterectomy.  Postmenopause with continued severe hot flashes and night sweats.  No pelvic pain. Pap Neg 04/2017. No h/o abnormal Pap. 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  Radiation therapy.  Didn't tolerated the Anti-Estrogen Rx.  Mammo 11/2020,  Rt Lumpectomy Benign Ductal Papilloma.  Urine and bowel movements normal.  Body mass index 34.18.  On a low-carb diet.  Mild to moderate physical activities. BD Osteopenia T-Score -1.7 in 01/2020. Health labs with family physician.  [place] 02/2018. ? ?2. S/P total hysterectomy ? ?3. Postmenopause ?S/P Total Hysterectomy.  Postmenopause with  continued severe hot flashes and night sweats.  No pelvic pain.  ? ?4. Age-related osteoporosis without current pathological fracture ?Mild to moderate physical activities. BD Osteopenia T-Score -1.7 in 01/2020. Repeat BD in 01/2022. ? ?5. Malignant neoplasm of upper-outer quadrant of left breast in female, estrogen receptor positive ([place]) ?Post Radiation therapy. ? ?Other orders ?- VALTREX 1 g tablet; Take by mouth. ?- tretinoin (RETIN-A) 0.025 % cream; Apply 1 application. topically at bedtime.  ? ?[name] MD, 3:56 PM 07/03/2021 ? ?  ?

## 2021-07-09 ENCOUNTER — Other Ambulatory Visit (INDEPENDENT_AMBULATORY_CARE_PROVIDER_SITE_OTHER): Payer: Self-pay | Admitting: Bariatrics

## 2021-07-09 DIAGNOSIS — F5089 Other specified eating disorder: Secondary | ICD-10-CM

## 2021-07-10 ENCOUNTER — Ambulatory Visit: Payer: Managed Care, Other (non HMO) | Admitting: Hematology and Oncology

## 2021-07-10 ENCOUNTER — Encounter: Payer: Self-pay | Admitting: Cardiovascular Disease

## 2021-07-14 ENCOUNTER — Ambulatory Visit: Payer: Managed Care, Other (non HMO)

## 2021-07-14 ENCOUNTER — Ambulatory Visit (INDEPENDENT_AMBULATORY_CARE_PROVIDER_SITE_OTHER): Payer: Self-pay | Admitting: Bariatrics

## 2021-07-15 ENCOUNTER — Ambulatory Visit (INDEPENDENT_AMBULATORY_CARE_PROVIDER_SITE_OTHER): Payer: Managed Care, Other (non HMO)

## 2021-07-15 ENCOUNTER — Encounter: Payer: Self-pay | Admitting: Pharmacist

## 2021-07-15 ENCOUNTER — Inpatient Hospital Stay: Payer: Commercial Managed Care - HMO | Admitting: Hematology and Oncology

## 2021-07-15 DIAGNOSIS — R591 Generalized enlarged lymph nodes: Secondary | ICD-10-CM

## 2021-07-15 IMAGING — MR MR NECK SOFT TISSUE ONLY WO/W CM
7 of 9 series · 33 of 48 positions shown · IV contrast (gadavist)
Comparison: None Available.

CLINICAL DATA: Lymphadenopathy of head and neck; technologist note
states palpable left posterior neck mass

EXAM:
MRI OF THE NECK WITH CONTRAST
TECHNIQUE: Multiplanar, multisequence MR imaging was performed following the
administration of intravenous contrast.
CONTRAST:  9mL GADAVIST GADOBUTROL 1 MMOL/ML IV SOLN

[Series 2: T1 · sagittal · 4.0mm · 0.69mm/px · 5 of 35 slices shown (1 of 3)]
[im 1/35]
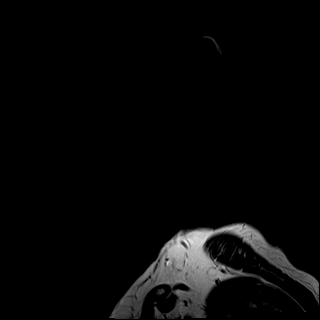
[im 9/35]
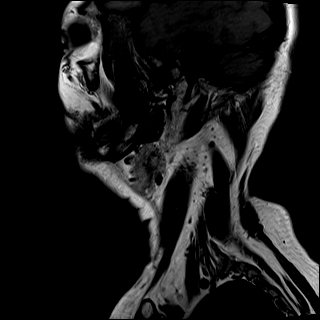
[im 18/35]
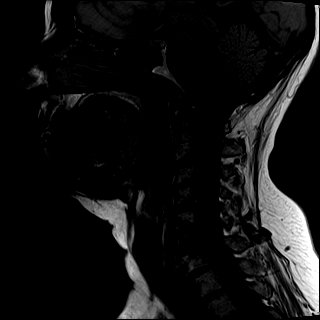
[im 26/35]
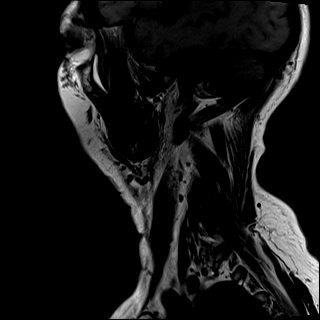
[im 35/35]
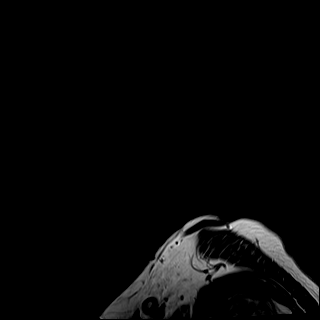

[Series 3: STIR · sagittal · 4.0mm · 0.45mm/px · 1 of 35 slices shown]
[im 1/35]
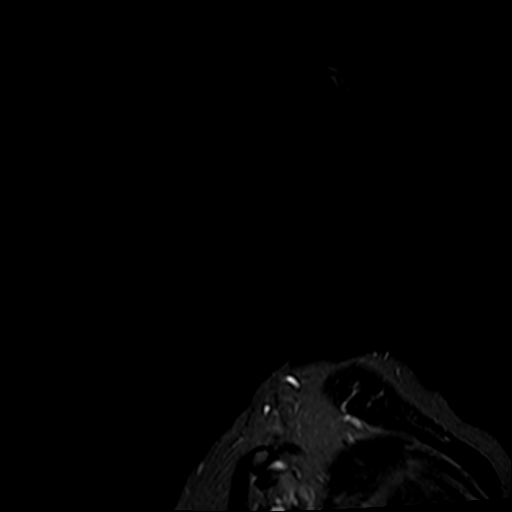

[Series 4: T1 · coronal · 4.0mm · 0.47mm/px · 5 of 30 slices shown (2 of 3)]
[im 1/30]
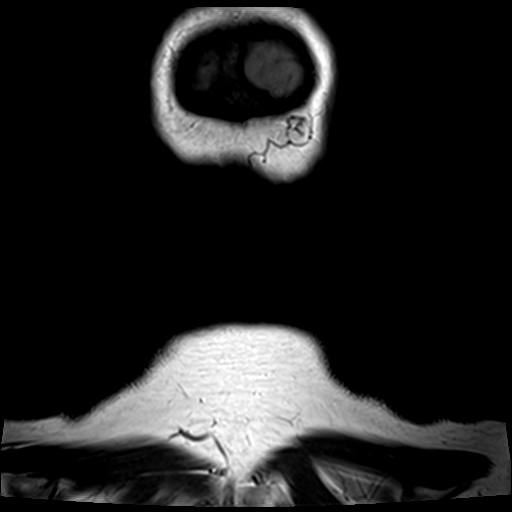
[im 8/30]
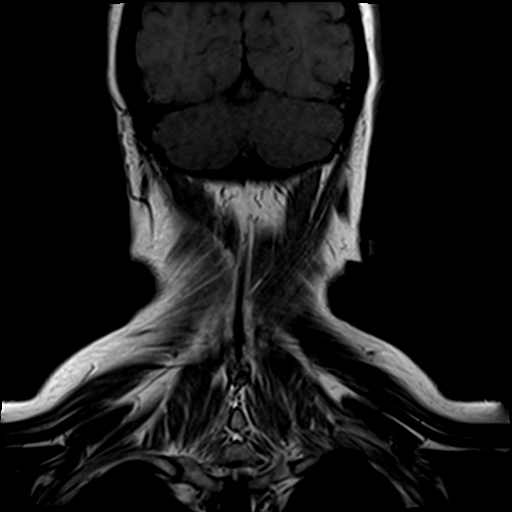
[im 15/30]
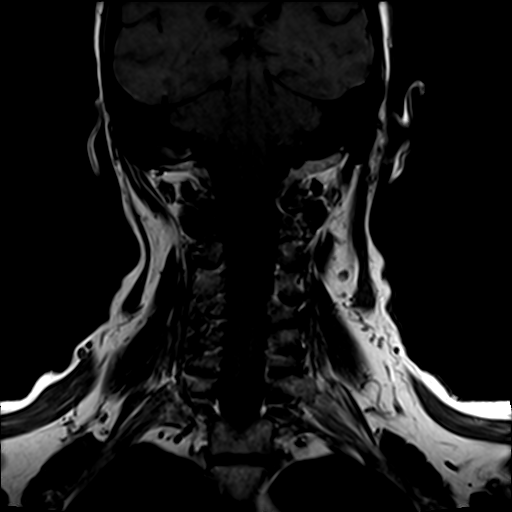
[im 22/30]
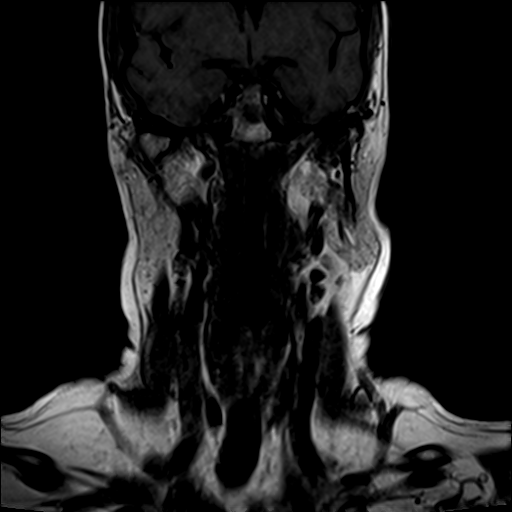
[im 30/30]
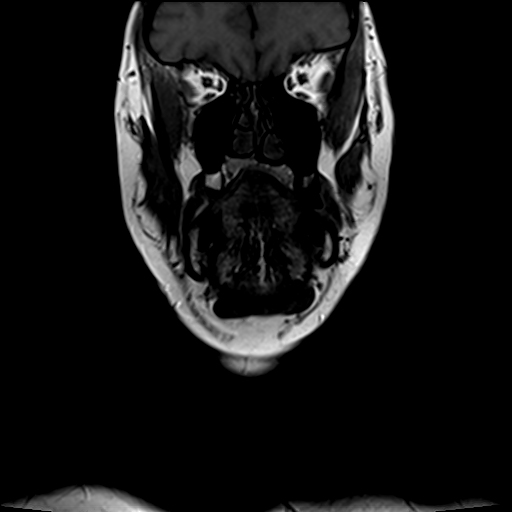

[Series 6: T1 · axial · 4.0mm · 0.86mm/px · z∈[-149,+23]mm · 6 of 40 slices shown (3 of 3)]
[im 1/40]
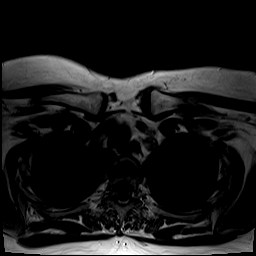
[im 8/40]
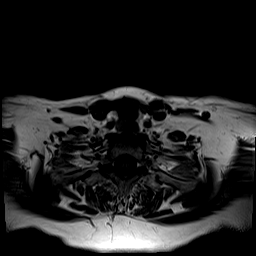
[im 16/40]
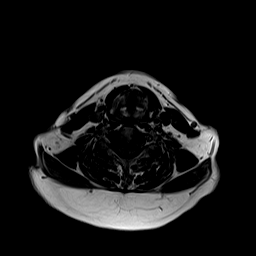
[im 24/40]
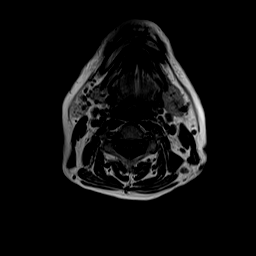
[im 32/40]
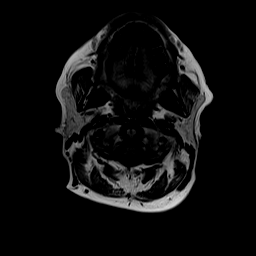
[im 40/40]
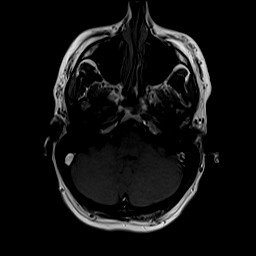

[Series 8: T1 fat-sat post-contrast · sagittal · 4.0mm · 0.69mm/px · 5 of 35 slices shown (1 of 3)]
[im 1/35]
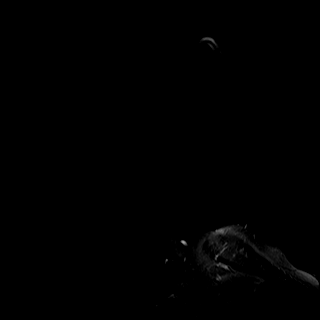
[im 9/35]
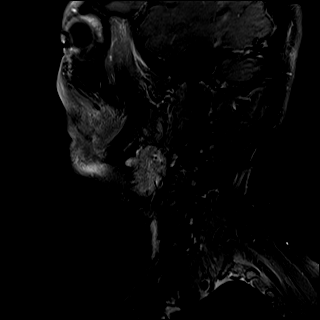
[im 18/35]
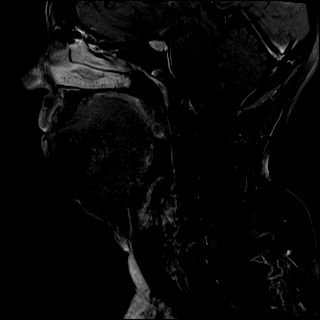
[im 26/35]
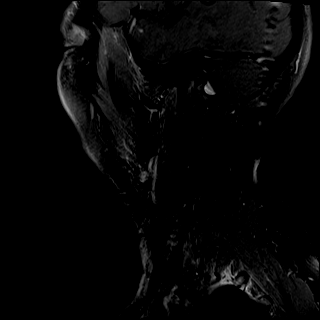
[im 35/35]
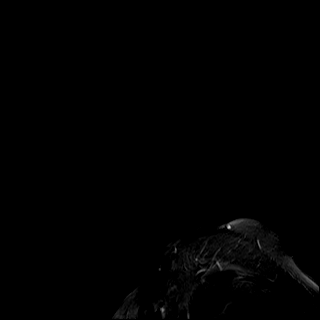

[Series 9: T1 fat-sat post-contrast · coronal · 4.0mm · 0.47mm/px · 5 of 30 slices shown (2 of 3)]
[im 1/30]
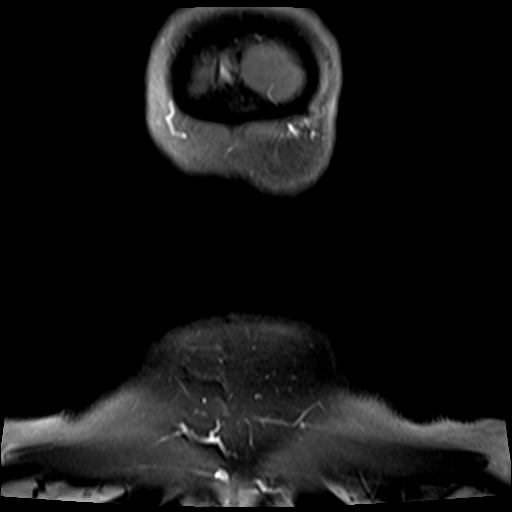
[im 8/30]
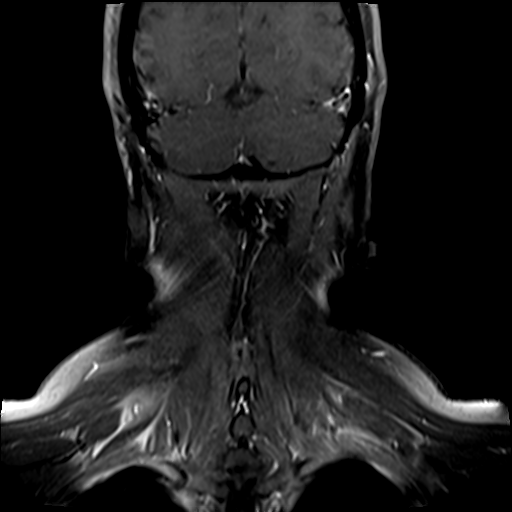
[im 15/30]
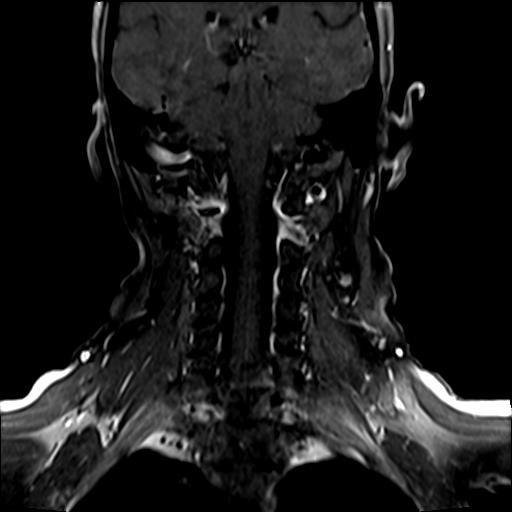
[im 22/30]
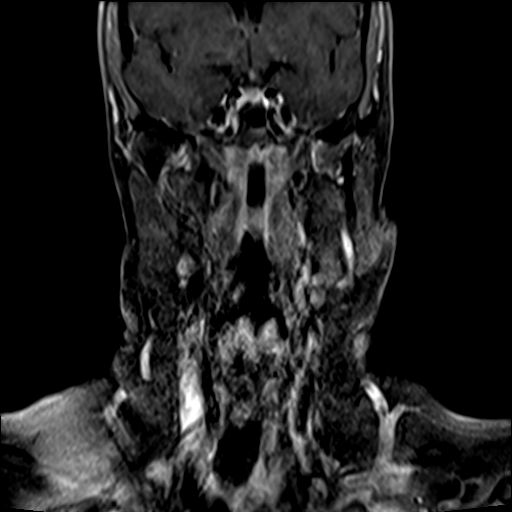
[im 30/30]
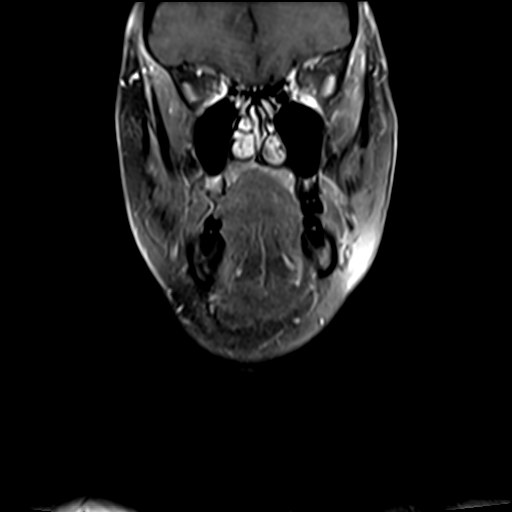

[Series 10: T1 fat-sat post-contrast · axial · 4.0mm · 0.86mm/px · z∈[-149,+23]mm · 6 of 40 slices shown (3 of 3)]
[im 1/40]
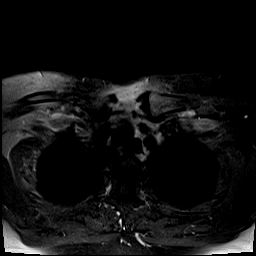
[im 8/40]
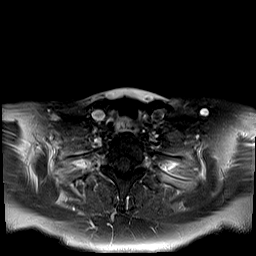
[im 16/40]
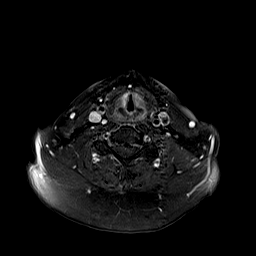
[im 24/40]
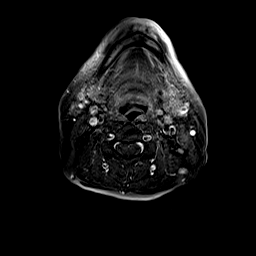
[im 32/40]
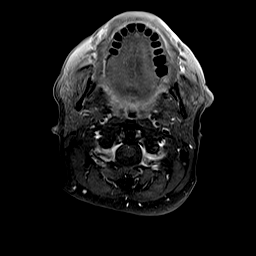
[im 40/40]
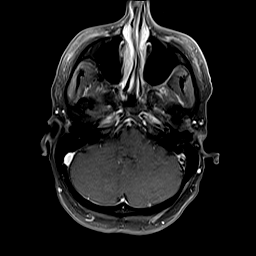

[33 of 48 positions shown; findings below may reference images not displayed]

FINDINGS: Pharynx and larynx: Unremarkable.  No mass or swelling.

Salivary glands: Parotid and submandibular glands are unremarkable.

Thyroid: Unremarkable.

Lymph nodes: No enlarged nodes.

Vascular: Major neck vessel flow voids are preserved.

Limited intracranial: No abnormal enhancement.

Visualized orbits: Unremarkable.

Mastoids and visualized paranasal sinuses: Aerated.

Skeleton: Degenerative changes of the cervical spine.

Upper chest: No apical lung mass.

Other: Skin marker overlies the left posterolateral neck at
approximately the level of the chin. There is a normal appearing
lymph node deep to this marker.
IMPRESSION: Normal appearing lymph node deep to skin marker likely reflects the
palpable area.

Otherwise no neck mass or adenopathy

## 2021-07-15 MED ORDER — GADOBUTROL 1 MMOL/ML IV SOLN
9.0000 mL | Freq: Once | INTRAVENOUS | Status: AC | PRN
  Administered 2021-07-15: 9 mL via INTRAVENOUS

## 2021-07-17 ENCOUNTER — Other Ambulatory Visit (INDEPENDENT_AMBULATORY_CARE_PROVIDER_SITE_OTHER): Payer: Self-pay | Admitting: Family Medicine

## 2021-07-17 ENCOUNTER — Other Ambulatory Visit: Payer: Self-pay

## 2021-07-17 DIAGNOSIS — F5089 Other specified eating disorder: Secondary | ICD-10-CM

## 2021-07-21 ENCOUNTER — Ambulatory Visit
Admission: RE | Admit: 2021-07-21 | Discharge: 2021-07-21 | Disposition: A | Discharge status: Home / Self Care | Payer: Commercial Managed Care - HMO | Source: Ambulatory Visit | Attending: Hematology and Oncology | Admitting: Hematology and Oncology

## 2021-07-21 DIAGNOSIS — Z9189 Other specified personal risk factors, not elsewhere classified: Secondary | ICD-10-CM

## 2021-07-21 DIAGNOSIS — C50412 Malignant neoplasm of upper-outer quadrant of left female breast: Secondary | ICD-10-CM

## 2021-07-21 IMAGING — MR MR BREAST BILAT WO/W CM
8 of 12 series · 29 of 48 positions shown · IV contrast (9 ML GADAVIST)
Comparison: Previous MRI exams, including [DATE], mammogram and
ultrasound studies [DATE] and earlier
COMPARISON: Previous MRI exams, including [DATE], mammogram and
ultrasound studies [DATE] and earlier

Addendum:
CLINICAL DATA: Breast cancer screening. History of LEFT breast
cancer with lumpectomy in [PX]. Patient had ultrasound-guided core
biopsies of the RIGHT breast performed in [DATE], showing
fibrocystic changes with focal hemosiderin deposition in the 9
o'clock location of the RIGHT breast 4 centimeters from the nipple,
marked with a ribbon clip. Also in the 9 o'clock location 4
centimeters from the nipple marked with a coil clip, a second biopsy
showed benign fibroadipose tissue with rare benign ducts.

EXAM:
BILATERAL BREAST MRI WITH AND WITHOUT CONTRAST
TECHNIQUE: Multiplanar, multisequence MR images of both breasts were obtained
prior to and following the intravenous administration of ml of
Gadavist

[Series 2: t2_tirm_tra ipat (a-p) · axial · 3.0mm · 0.70mm/px · 1 of 55 slices shown]
[im 1/55]
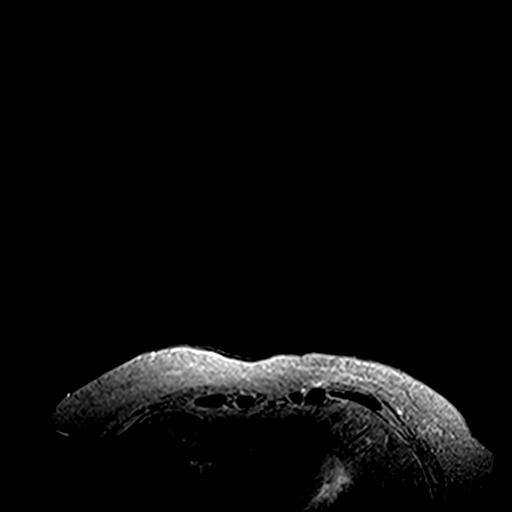

[Series 3: fl3d pre-cm no · axial · non-contrast · 1.2mm · 0.89mm/px · z∈[-76,+95]mm · 5 of 144 slices shown]
[im 1/144]
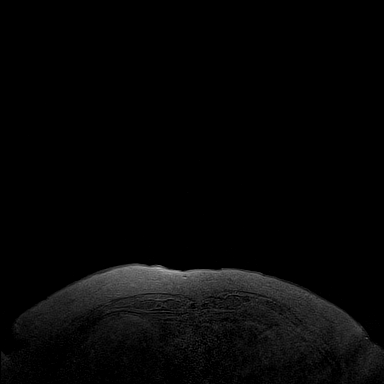
[im 36/144]
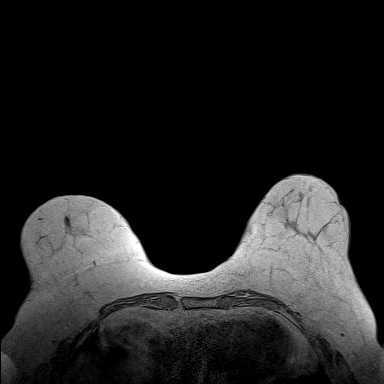
[im 72/144]
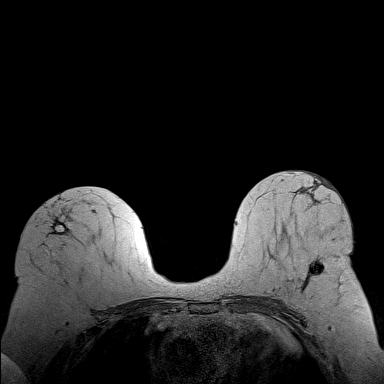
[im 108/144]
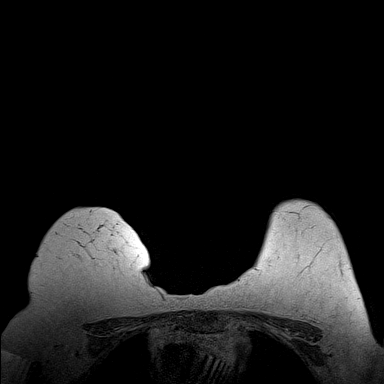
[im 144/144]
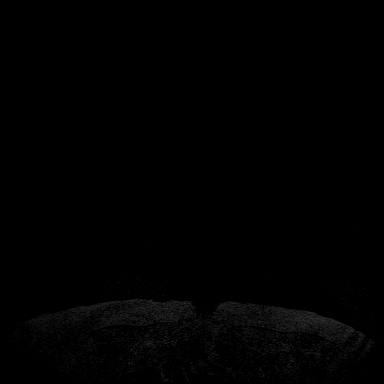

[Series 4: fl3d pre-cm · axial · non-contrast · 1.2mm · 0.89mm/px · z∈[-76,+95]mm · 5 of 144 slices shown]
[im 1/144]
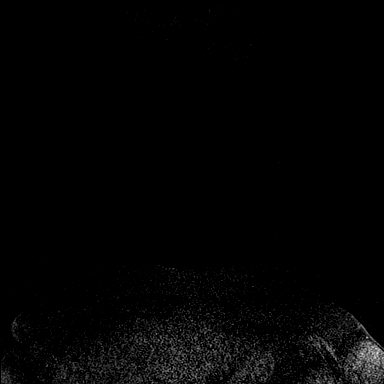
[im 36/144]
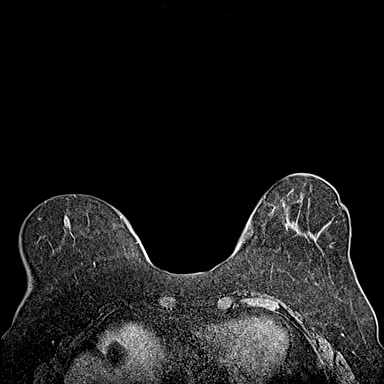
[im 72/144]
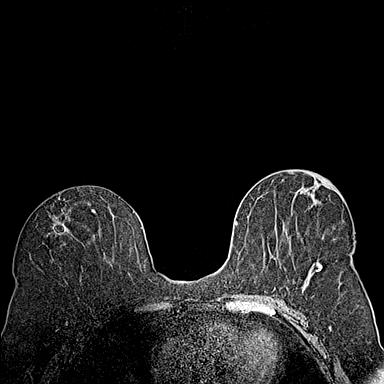
[im 108/144]
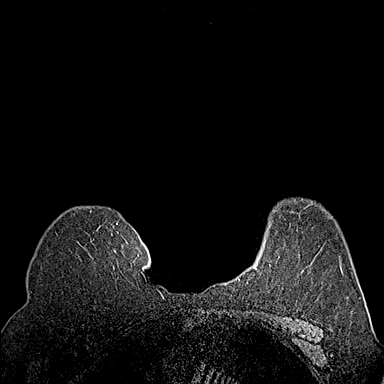
[im 144/144]
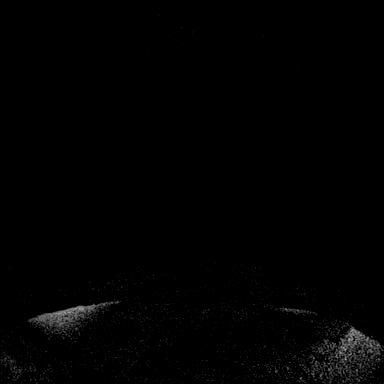

[Series 5: fl3d post-cm 20 · axial · 1.2mm · 0.89mm/px · z∈[-76,+95]mm · 5 of 144 slices shown (1 of 3)]
[im 1/144]
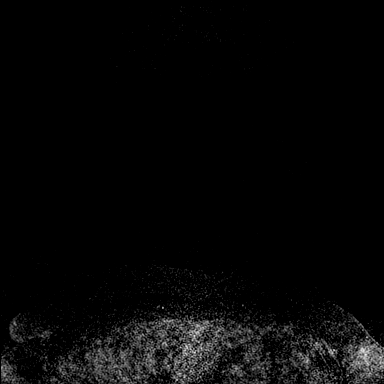
[im 36/144]
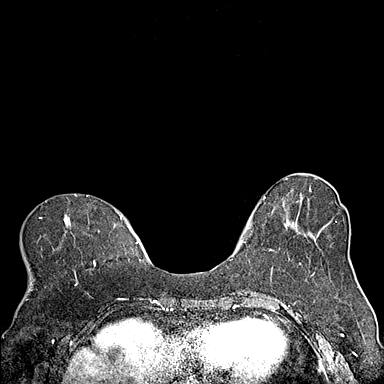
[im 72/144]
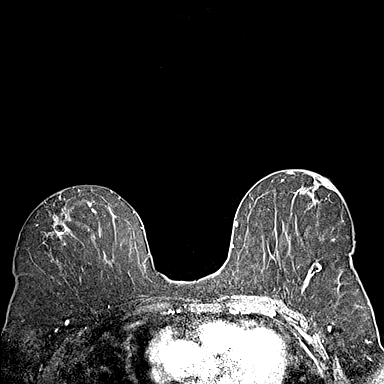
[im 108/144]
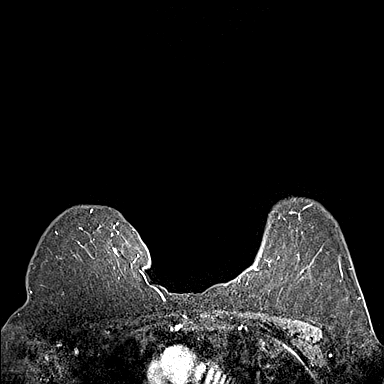
[im 144/144]
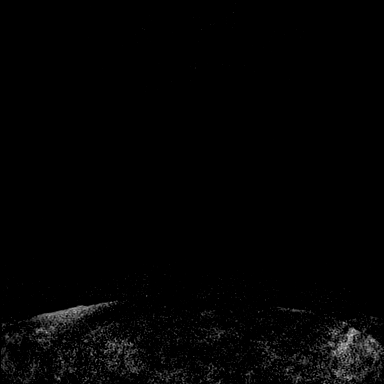

[Series 6: fl3d post-cm 20 · axial · 1.2mm · 0.89mm/px · z∈[-76,+95]mm · 5 of 144 slices shown (2 of 3)]
[im 1/144]
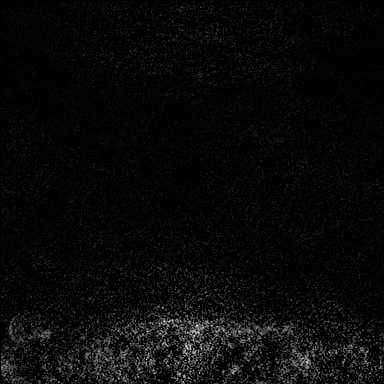
[im 36/144]
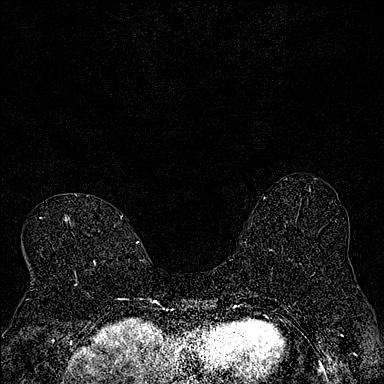
[im 72/144]
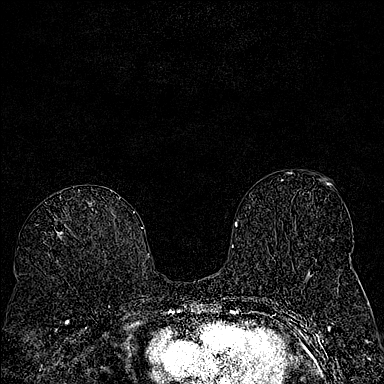
[im 108/144]
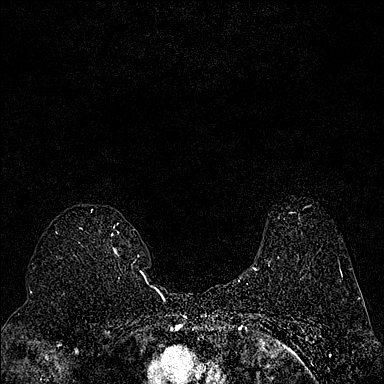
[im 144/144]
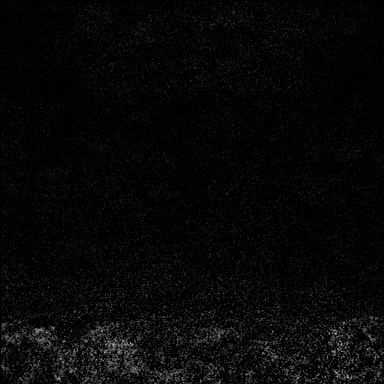

[Series 7: fl3d post-cm 20 · axial · 172.8mm · 0.89mm/px · 1 of 1 slices shown (3 of 3)]
[im 1/1]
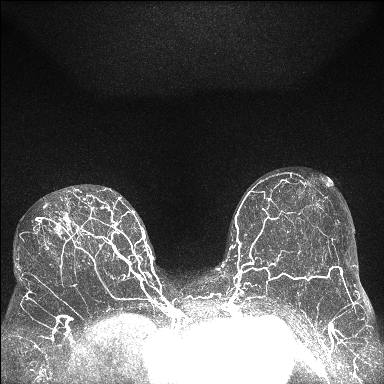

[Series 8: fl3d post-cm 3 · axial · 1.2mm · 0.89mm/px · z∈[-76,+95]mm · 6 of 144 slices shown (1 of 2)]
[im 1/144]
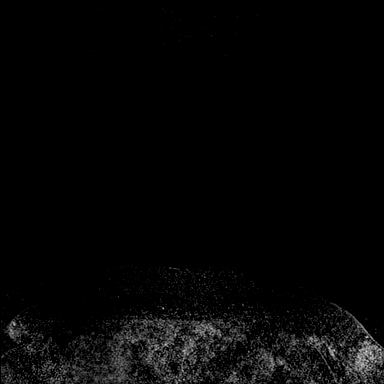
[im 29/144]
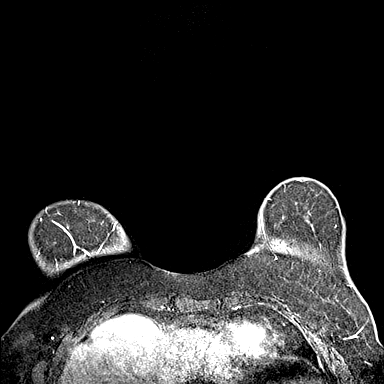
[im 58/144]
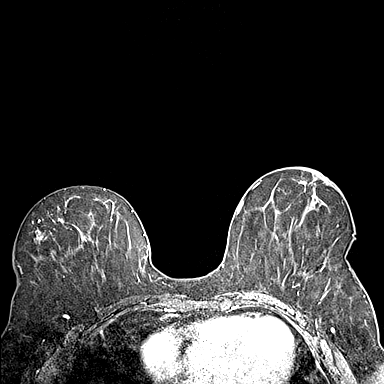
[im 86/144]
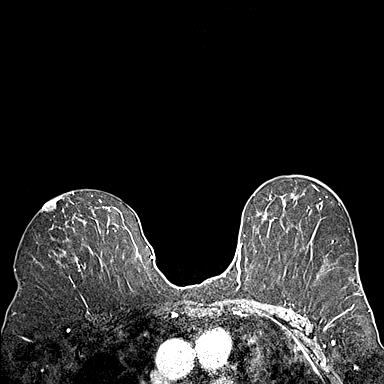
[im 115/144]
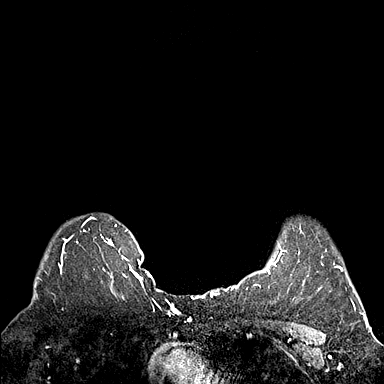
[im 144/144]
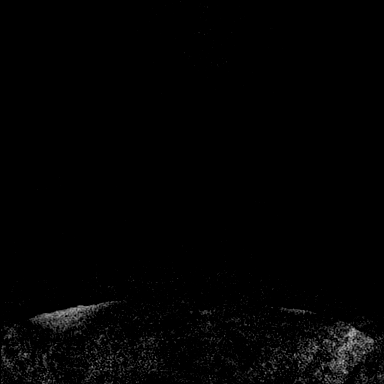

[Series 9: fl3d post-cm 3 · axial · 1.2mm · 0.89mm/px · 1 of 144 slices shown (2 of 2)]
[im 1/144]
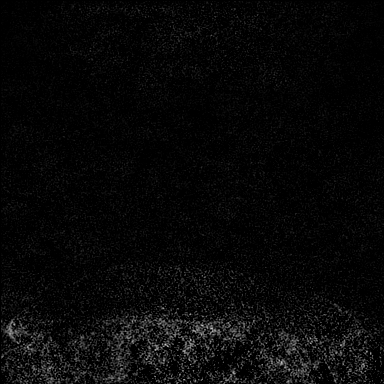

[29 of 48 positions shown; findings below may reference images not displayed]

Three-dimensional MR images were rendered by post-processing of the
original MR data on an independent workstation. The
three-dimensional MR images were interpreted, and findings are
reported in the following complete MRI report for this study. Three
dimensional images were evaluated at the independent interpreting
workstation using the DynaCAD thin client.
FINDINGS: Breast composition: b. Scattered fibroglandular tissue.

Background parenchymal enhancement: Minimal

Right breast: Biopsy clips are identified within the LATERAL aspect
of the RIGHT breast from previous benign ultrasound-guided core
biopsies. Focal fat necrosis identified within the anterior UPPER
OUTER QUADRANT of the RIGHT breast. No suspicious mass or
enhancement.

Left breast: Postoperative changes are seen in the LATERAL aspect of
the LEFT breast. No suspicious masses or enhancement.

Lymph nodes: No abnormal appearing lymph nodes.

Ancillary findings: There is a benign cyst measuring 2.2 centimeters
in the anterior aspect of the liver.
IMPRESSION: No MRI evidence for malignancy.

Expected postoperative changes in the LEFT breast and post biopsy
changes in the RIGHT breast.

Incidental note of benign hepatic cyst.

RECOMMENDATION:
Recommend screening mammogram in [DATE].

The [HOSPITAL] recommends annual MRI in addition
to annual mammography for women with personal history of breast
cancer and dense breast tissue, or those diagnosed before age 50.

BI-RADS CATEGORY  2: Benign.

ADDENDUM:
Multiplanar, multisequence MR images of both breasts were obtained
prior to and following the intravenous administration of 9 ml of
Gadavist

*** End of Addendum ***
Three-dimensional MR images were rendered by post-processing of the
original MR data on an independent workstation. The
three-dimensional MR images were interpreted, and findings are
reported in the following complete MRI report for this study. Three
dimensional images were evaluated at the independent interpreting
workstation using the DynaCAD thin client.
FINDINGS: Breast composition: b. Scattered fibroglandular tissue.

Background parenchymal enhancement: Minimal

Right breast: Biopsy clips are identified within the LATERAL aspect
of the RIGHT breast from previous benign ultrasound-guided core
biopsies. Focal fat necrosis identified within the anterior UPPER
OUTER QUADRANT of the RIGHT breast. No suspicious mass or
enhancement.

Left breast: Postoperative changes are seen in the LATERAL aspect of
the LEFT breast. No suspicious masses or enhancement.

Lymph nodes: No abnormal appearing lymph nodes.

Ancillary findings: There is a benign cyst measuring 2.2 centimeters
in the anterior aspect of the liver.
IMPRESSION: No MRI evidence for malignancy.

Expected postoperative changes in the LEFT breast and post biopsy
changes in the RIGHT breast.

Incidental note of benign hepatic cyst.

RECOMMENDATION:
Recommend screening mammogram in [DATE].

The [HOSPITAL] recommends annual MRI in addition
to annual mammography for women with personal history of breast
cancer and dense breast tissue, or those diagnosed before age 50.

BI-RADS CATEGORY  2: Benign.

## 2021-07-21 MED ORDER — GADOBUTROL 1 MMOL/ML IV SOLN
9.0000 mL | Freq: Once | INTRAVENOUS | Status: AC | PRN
  Administered 2021-07-21: 9 mL via INTRAVENOUS

## 2021-07-24 ENCOUNTER — Inpatient Hospital Stay: Payer: Commercial Managed Care - HMO | Attending: Hematology and Oncology | Admitting: Hematology and Oncology

## 2021-07-24 ENCOUNTER — Encounter (INDEPENDENT_AMBULATORY_CARE_PROVIDER_SITE_OTHER): Payer: Self-pay | Admitting: Family Medicine

## 2021-07-24 ENCOUNTER — Encounter: Payer: Self-pay | Admitting: Hematology and Oncology

## 2021-07-24 ENCOUNTER — Ambulatory Visit (INDEPENDENT_AMBULATORY_CARE_PROVIDER_SITE_OTHER): Payer: Commercial Managed Care - HMO | Admitting: Family Medicine

## 2021-07-24 VITALS — BP 108/72 | HR 70 | Temp 97.9°F | Ht 66.0 in | Wt 204.0 lb

## 2021-07-24 VITALS — BP 121/71 | HR 72 | Temp 98.2°F | Resp 16 | Ht 66.0 in | Wt 209.4 lb

## 2021-07-24 DIAGNOSIS — K219 Gastro-esophageal reflux disease without esophagitis: Secondary | ICD-10-CM | POA: Insufficient documentation

## 2021-07-24 DIAGNOSIS — E669 Obesity, unspecified: Secondary | ICD-10-CM

## 2021-07-24 DIAGNOSIS — Z79899 Other long term (current) drug therapy: Secondary | ICD-10-CM | POA: Insufficient documentation

## 2021-07-24 DIAGNOSIS — Z7982 Long term (current) use of aspirin: Secondary | ICD-10-CM | POA: Diagnosis not present

## 2021-07-24 DIAGNOSIS — I341 Nonrheumatic mitral (valve) prolapse: Secondary | ICD-10-CM | POA: Diagnosis not present

## 2021-07-24 DIAGNOSIS — E785 Hyperlipidemia, unspecified: Secondary | ICD-10-CM | POA: Diagnosis not present

## 2021-07-24 DIAGNOSIS — Z809 Family history of malignant neoplasm, unspecified: Secondary | ICD-10-CM | POA: Insufficient documentation

## 2021-07-24 DIAGNOSIS — C773 Secondary and unspecified malignant neoplasm of axilla and upper limb lymph nodes: Secondary | ICD-10-CM | POA: Diagnosis not present

## 2021-07-24 DIAGNOSIS — Z87891 Personal history of nicotine dependence: Secondary | ICD-10-CM | POA: Diagnosis not present

## 2021-07-24 DIAGNOSIS — Z801 Family history of malignant neoplasm of trachea, bronchus and lung: Secondary | ICD-10-CM | POA: Diagnosis not present

## 2021-07-24 DIAGNOSIS — I10 Essential (primary) hypertension: Secondary | ICD-10-CM | POA: Diagnosis not present

## 2021-07-24 DIAGNOSIS — F5089 Other specified eating disorder: Secondary | ICD-10-CM | POA: Diagnosis not present

## 2021-07-24 DIAGNOSIS — Z170 Estrogen receptor positive status [ER+]: Secondary | ICD-10-CM | POA: Diagnosis not present

## 2021-07-24 DIAGNOSIS — C50412 Malignant neoplasm of upper-outer quadrant of left female breast: Secondary | ICD-10-CM | POA: Diagnosis present

## 2021-07-24 DIAGNOSIS — Z79811 Long term (current) use of aromatase inhibitors: Secondary | ICD-10-CM | POA: Diagnosis not present

## 2021-07-24 DIAGNOSIS — I2510 Atherosclerotic heart disease of native coronary artery without angina pectoris: Secondary | ICD-10-CM | POA: Insufficient documentation

## 2021-07-24 DIAGNOSIS — Z923 Personal history of irradiation: Secondary | ICD-10-CM | POA: Diagnosis not present

## 2021-07-24 DIAGNOSIS — E039 Hypothyroidism, unspecified: Secondary | ICD-10-CM | POA: Insufficient documentation

## 2021-07-24 DIAGNOSIS — J449 Chronic obstructive pulmonary disease, unspecified: Secondary | ICD-10-CM | POA: Diagnosis not present

## 2021-07-24 DIAGNOSIS — Z6833 Body mass index (BMI) 33.0-33.9, adult: Secondary | ICD-10-CM | POA: Diagnosis not present

## 2021-07-24 MED ORDER — BUPROPION HCL ER (SR) 200 MG PO TB12: 200.0000 mg | ORAL_TABLET | Freq: Every morning | ORAL | 0 refills | Status: DC

## 2021-07-24 NOTE — Progress Notes (Signed)
?[place] [facility]  ?[phone] [phone]  ? ? ? ?ID: Bethany [name] DOB: [birth year]  MR#: [id number]  ENI#:[id number] ? ?Patient Care Team: ?Leeroy Cha, MD as PCP - General (Internal Medicine) ?Burnell Blanks, MD as PCP - Cardiology (Cardiology) ?Erroll Luna, MD as Consulting Physician (General Surgery) ?Magrinat, Virgie Dad, MD (Inactive) as Consulting Physician (Oncology) ?Kyung Rudd, MD as Consulting Physician (Radiation Oncology) ?Irene Shipper, MD as Consulting Physician (Gastroenterology) ?[name], MD as Consulting Physician (Obstetrics and Gynecology) ?Delrae Rend, MD as Consulting Physician (Endocrinology) ?Mauro Kaufmann, RN as Oncology Nurse Navigator ?Rockwell Germany, RN as Oncology Nurse Navigator ?Garner Nash, DO as Consulting Physician (Pulmonary Disease) ?Mcarthur Rossetti, MD as Consulting Physician (Orthopedic Surgery) ?Irene Limbo, MD as Consulting Physician (Plastic Surgery) ? ?CHIEF COMPLAINT: Estrogen receptor positive breast cancer ? ?CURRENT TREATMENT: Observation ? ?INTERVAL HISTORY: ? ?Bethany [name] returns today for follow-up of her estrogen receptor positive breast cancer. She is now under observation due to her inability to tolerate antiestrogens. ?She has been doing quite well except she feels that her mood can be better.  She was wondering if the venlafaxine can be increased.  She has not noticed any changes in her breast.  She recently had her MRI.  No change in her breathing bowel habits or urinary habits.  Rest of the pertinent 10 point ROS reviewed and negative ? ? COVID 19 VACCINATION STATUS: Status post Pfizer x2 as of November 2022 ? ?HISTORY OF CURRENT ILLNESS: ?The original intake note: ? ?"Bethany [name]" had routine screening mammography on 10/26/2017 showing a possible abnormality in the left breast. She underwent unilateral left diagnostic mammography with tomography and left breast ultrasonography at [facility] on 11[facility]06[facility]2017 showing: breast density category B. There was a hypoechoic lesion consistent of a mass at the 2:30 o'clock upper outer quadrant middle depth and measuring 0.5 x 0.3 x 0.4 cm. Sonographic evaluation of the left axilla shows no enlarged or abnormal lymph nodes. ? ?An attempt was made to obtain a biopsy of this lesion on 11/03/2017, however the patient had repeated episodes of syncope during the attempted procedure. ? ?She underwent left lumpectomy of the lesion on 11/25/2017 showing ([id number]): Invasive ductal carcinoma, grade I spanning 1.0 cm. Margins were negative for carcinoma. Prognostic indicators significant for: estrogen receptor, 90% positive and progesterone receptor, 80% positive, both with strong staining intensity. Proliferation marker Ki67 at 3%. HER2 negative with an immunohistochemistry of (1+). ? ?Then in a separate procedure she underwent left sentinel lymph node sampling on 12/09/2017 showing ([id number]): Four left axillary sentinel lymph were negative for carcinoma. (0/4).  ? ?Her subsequent history is as detailed below. ? ? ?PAST MEDICAL HISTORY: ?Past Medical History:  ?Diagnosis Date  ? ADD (attention deficit disorder)   ? Anxiety   ? B12 deficiency   ? Breast cancer ([place]) 2019  ? left breast cancer/diagnosed in 10/2017/taking radiation treatment until 03/15/18  ? Breast mass, left 11/2017  ? going thru radiation until 02/2018  ? CAD (coronary artery disease)   ? a. DES to LAD 06/2019.  ? Chest pain 06/19/2014  ? COPD (chronic obstructive pulmonary disease) ([place])   ? beginning stages/small scar  ? Dental crowns present   ? Depression   ? Hiatal hernia   ? History of hiatal hernia   ? no current med.  ? History of thyroid cancer 12/15/2017  ? Hypertension   ? states under control with med., has been on med. x 1  yr.  ? Hypothyroidism   ? Malignant neoplasm of upper-outer quadrant of left breast in female, estrogen receptor positive ([place])   ? Mild hyperlipidemia   ?  Mucocele of appendix 10/03/2015  ? MVP (mitral valve prolapse)   ? not seen on echo 05/2019  ? Personal history of radiation therapy 2019  ? Left Breast Cancer  ? PONV (postoperative nausea and vomiting)   ? Prediabetes   ? 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 Syncope and collapse 12/25/2008  ? Qualifier: Diagnosis of  By: Philemon Kingdom    ? Urinary incontinence   ? USI   ? Vitamin D deficiency   ?Thyroid cancer, GERD ? ? ?PAST SURGICAL HISTORY: ?Past Surgical History:  ?Procedure Laterality Date  ? ABDOMINAL HYSTERECTOMY    ? partial  ? APPENDECTOMY    ? AXILLARY SENTINEL NODE BIOPSY Left 12/09/2017  ? Procedure: AXILLARY SENTINEL NODE BIOPSY;  Surgeon: Erroll Luna, MD;  Location: [facility];  Service: General;  Laterality: Left;  ? BREAST EXCISIONAL BIOPSY Right 09/20/2019  ? BREAST LUMPECTOMY Left 11/2017  ? BREAST LUMPECTOMY WITH NEEDLE LOCALIZATION Left 11/25/2017  ? Procedure: LEFT BREAST LUMPECTOMY WITH NEEDLE LOCALIZATION;  Surgeon: Erroll Luna, MD;  Location: [facility];  Service: General;  Laterality: Left;  ? BREAST LUMPECTOMY WITH RADIOACTIVE SEED LOCALIZATION Right 09/20/2019  ? Procedure: RIGHT BREAST LUMPECTOMY WITH RADIOACTIVE SEED LOCALIZATION;  Surgeon: Erroll Luna, MD;  Location: [facility];  Service: General;  Laterality: Right;  ? COLONOSCOPY WITH PROPOFOL  10/03/2015  ? CORONARY STENT INTERVENTION N/A 06/29/2019  ? Procedure: CORONARY STENT INTERVENTION;  Surgeon: Burnell Blanks, MD;  Location: [facility];  Service: Cardiovascular;  Laterality: N/A;  ? LAPAROSCOPIC APPENDECTOMY N/A 10/03/2015  ? Procedure: APPENDECTOMY LAPAROSCOPIC;  Surgeon: Rolm Bookbinder, MD;  Location: [facility];  Service: General;  Laterality: N/A;  ? LEFT HEART CATH AND CORONARY ANGIOGRAPHY N/A 06/29/2019  ? Procedure: LEFT HEART CATH AND CORONARY ANGIOGRAPHY;  Surgeon: Burnell Blanks, MD;  Location: [facility];  Service:  Cardiovascular;  Laterality: N/A;  ? TOTAL THYROIDECTOMY  04/02/2003  ?Hysterectomy without BSO ? ? ?FAMILY HISTORY ?Family History  ?Problem Relation Age of Onset  ? Colon polyps Mother   ? High Cholesterol Mother   ? Alcoholism Mother   ? Hypertension Father   ? Heart disease Father   ? Non-Hodgkin's lymphoma Father 32  ? Cancer Father   ? Depression Father   ? Anxiety disorder Father   ? Alcoholism Father   ? Hypertension Brother   ? Lung cancer Paternal Aunt   ?     d. 109  ? Throat cancer Maternal Grandfather   ?     d. 75  ? Throat cancer Paternal Grandmother   ?     dx < 5  ? Heart disease Paternal Grandfather   ? Colon cancer Other   ?     great maternal aunt   ? Stomach cancer Neg Hx   ? Rectal cancer Neg Hx   ? Esophageal cancer Neg Hx   ?The patient's father died in April 17, 2017 age age 52 due to non-Hodgkin's lymphoma. The patient's mother is alive at age 60 as of October 2019. The patient has 1 brother, no sisters. There was a paternal aunt with lung cancer. There was a paternal grandmother and maternal grandfather with throat cancer. She denies a family history of breast or ovarian cancer.  ? ? ?  GYNECOLOGIC HISTORY:  ?No LMP recorded. Patient has had a hysterectomy. ?Menarche: 61 years old ?Age at first live birth: 61 years old ?She is GX P3.  ?She is status post partial hysterectomy (without BSO) in her late 79's. She did not have HRT.  ? ? 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 Her husband, Patrick Jupiter, is a Clinical cytogeneticist for [employer]. At home is her, her husband, and their two dogs. The patient's oldest son, [place], has 3 children and lives in [place] and works in Nurse, children's for [employer]. The patient's son, Alpha Gula lives in [place] with 1 daughter and works as a Teacher, music. The patient's youngest son, Aline Brochure lives in [place] and works for [employer] in [place]. The patient has 5 grandchildren total. Her grandchildren are ages 33, 44, 86, 48, and 3. She notes the  youngest has Down Syndrome and recently turned 3. She does not currently belong to a church, but she is [place].   ? ? ADVANCED DIRECTIVES: In the absence of any documentation to the contrary, the patient's spouse is their H

## 2021-07-28 ENCOUNTER — Telehealth (INDEPENDENT_AMBULATORY_CARE_PROVIDER_SITE_OTHER): Payer: Self-pay | Admitting: Family Medicine

## 2021-08-05 ENCOUNTER — Telehealth: Payer: Self-pay

## 2021-08-05 NOTE — Telephone Encounter (Signed)
Called the number on the back of the insurance card and got thru to a representative who stated that the praluent '75mg'$  appeal was still open and awaiting determination was submitted as single level appeal and can take 30 calender days from 07/15/21 so not enough time had passed I will route to Dr. Prentiss Bells to make her aware.

## 2021-08-05 NOTE — Telephone Encounter (Signed)
I called the number you provided to me and they only go straight to voicemail. Routing to dr. Prentiss Bells.

## 2021-08-06 NOTE — Progress Notes (Signed)
Chief Complaint:   OBESITY Bethany [name] is here to discuss her progress with her obesity treatment plan along with follow-up of her obesity related diagnoses. Bethany [name] is on following a lower carbohydrate, vegetable and lean protein rich diet plan and states she is following her eating plan approximately 85-90% of the time. Bethany [name] states she is walking for 20 minutes 2-3 times per week.  Today's visit was #: 4 Starting weight: 203 lbs Starting date: 05/22/2021 Today's weight: 204 lbs Today's date: 07/24/2021 Total lbs lost to date: 0 Total lbs lost since last in-office visit: 0  Interim History: Bethany [name] is working on her weight loss, but she is struggling with increased emotional eating behaviors. She will be traveling soon and she is open to discussing travel eating behaviors.   Subjective:   1. Other disorder of eating/emotional eating Bethany [name] notes increased emotional eating behaviors and cravings. She is not getting much benefit from Wellbutrin at her low dose.   Assessment/Plan:   1. Other disorder of eating/emotional eating Bethany [name] agreed to increase Wellbutrin SR to 200 mg q AM with no refills. Behavior modification techniques were discussed today to help Bethany [name] deal with her emotional/non-hunger eating behaviors.  Orders and follow up as documented in patient record.   - buPROPion (WELLBUTRIN SR) 200 MG 12 hr tablet; Take 1 tablet (200 mg total) by mouth every morning.  Dispense: 30 tablet; Refill: 0  2. Obesity, Current BMI 33.0 Bethany [name] is currently in the action stage of change. As such, her goal is to continue with weight loss efforts. She has agreed to following a lower carbohydrate, vegetable and lean protein rich diet plan.   Exercise goals: As is.  Behavioral modification strategies: increasing lean protein intake and travel eating strategies.  Bethany [name] has agreed to follow-up with our clinic in 4 weeks. She was informed of the importance of frequent follow-up visits to  maximize her success with intensive lifestyle modifications for her multiple health conditions.   Objective:   Blood pressure 108/72, pulse 70, temperature 97.9 F (36.6 C), height '5\' 6"'$  (1.676 m), weight 204 lb (92.5 kg), SpO2 97 %. Body mass index is 32.93 kg/m.  General: Cooperative, alert, well developed, in no acute distress. HEENT: Conjunctivae and lids unremarkable. Cardiovascular: Regular rhythm.  Lungs: Normal work of breathing. Neurologic: No focal deficits.   Lab Results  Component Value Date   CREATININE 0.85 06/30/2021   BUN 15 06/30/2021   NA 142 06/30/2021   K 4.4 06/30/2021   CL 103 06/30/2021   CO2 23 06/30/2021   Lab Results  Component Value Date   ALT 30 06/26/2021   AST 26 06/26/2021   ALKPHOS 72 06/26/2021   BILITOT 0.4 06/26/2021   Lab Results  Component Value Date   HGBA1C 5.7 (H) 05/22/2021   HGBA1C 5.7 (H) 02/28/2020   HGBA1C 5.7 (H) 10/16/2019   HGBA1C 5.7 (H) 06/19/2014   Lab Results  Component Value Date   INSULIN 15.5 05/22/2021   INSULIN 10.5 02/28/2020   INSULIN 11.1 10/16/2019   Lab Results  Component Value Date   TSH 1.020 10/16/2019   Lab Results  Component Value Date   CHOL 128 06/30/2021   HDL 83 06/30/2021   LDLCALC 33 06/30/2021   TRIG 54 06/30/2021   CHOLHDL 1.5 06/30/2021   Lab Results  Component Value Date   VD25OH 32.7 02/28/2020   VD25OH 28.1 (L) 10/16/2019   Lab Results  Component Value Date   WBC 5.6 06/26/2021  HGB 12.5 06/26/2021   HCT 38.8 06/26/2021   MCV 90.9 06/26/2021   PLT 294 06/26/2021   No results found for: IRON, TIBC, FERRITIN  Attestation Statements:   Reviewed by clinician on day of visit: allergies, medications, problem list, medical history, surgical history, family history, social history, and previous encounter notes.  Time spent on visit including pre-visit chart review and post-visit care and charting was 30 minutes.    I, Trixie Dredge, am acting as transcriptionist for  Dennard Nip, MD.  I have reviewed the above documentation for accuracy and completeness, and I agree with the above. -  Dennard Nip, MD

## 2021-08-07 ENCOUNTER — Other Ambulatory Visit (INDEPENDENT_AMBULATORY_CARE_PROVIDER_SITE_OTHER): Payer: Self-pay | Admitting: Family Medicine

## 2021-08-07 DIAGNOSIS — F5089 Other specified eating disorder: Secondary | ICD-10-CM

## 2021-08-13 ENCOUNTER — Ambulatory Visit (INDEPENDENT_AMBULATORY_CARE_PROVIDER_SITE_OTHER): Payer: Self-pay | Admitting: Bariatrics

## 2021-08-14 ENCOUNTER — Telehealth: Payer: Self-pay

## 2021-08-14 NOTE — Telephone Encounter (Signed)
Called and spoke w/rep at [place] who stated the praluent was in the appeals department and would have to transfer me again after being on hold for 50 mins just to talk to her to be transferred. So held again until my timer reached.... 60 mins. They stated that this particular plan they have 90 days to make determination on appeals and that no decision has been reached.  Case id: [id number]

## 2021-08-14 NOTE — Telephone Encounter (Signed)
-----   Message from Ramond Dial, Home sent at 08/14/2021 10:01 AM EDT ----- Regarding: RE: determination No, can you please try calling again? thanks ----- Message ----- From: Allean Found, CMA Sent: 08/14/2021   9:22 AM EDT To: Ramond Dial, RPH-CPP Subject: determination                                  Have you heard anything on her appeal as of yet?

## 2021-08-19 ENCOUNTER — Other Ambulatory Visit: Payer: Self-pay | Admitting: *Deleted

## 2021-08-20 ENCOUNTER — Encounter (INDEPENDENT_AMBULATORY_CARE_PROVIDER_SITE_OTHER): Payer: Self-pay | Admitting: Bariatrics

## 2021-08-20 ENCOUNTER — Ambulatory Visit (INDEPENDENT_AMBULATORY_CARE_PROVIDER_SITE_OTHER): Payer: Commercial Managed Care - HMO | Admitting: Bariatrics

## 2021-08-20 ENCOUNTER — Other Ambulatory Visit (INDEPENDENT_AMBULATORY_CARE_PROVIDER_SITE_OTHER): Payer: Self-pay | Admitting: Bariatrics

## 2021-08-20 ENCOUNTER — Other Ambulatory Visit: Payer: Self-pay | Admitting: *Deleted

## 2021-08-20 VITALS — BP 133/82 | HR 73 | Temp 97.9°F | Ht 66.0 in | Wt 205.0 lb

## 2021-08-20 DIAGNOSIS — Z6833 Body mass index (BMI) 33.0-33.9, adult: Secondary | ICD-10-CM

## 2021-08-20 DIAGNOSIS — E7849 Other hyperlipidemia: Secondary | ICD-10-CM

## 2021-08-20 DIAGNOSIS — R632 Polyphagia: Secondary | ICD-10-CM

## 2021-08-20 DIAGNOSIS — R7303 Prediabetes: Secondary | ICD-10-CM

## 2021-08-20 DIAGNOSIS — F5089 Other specified eating disorder: Secondary | ICD-10-CM

## 2021-08-20 DIAGNOSIS — E669 Obesity, unspecified: Secondary | ICD-10-CM

## 2021-08-20 MED ORDER — OZEMPIC (0.25 OR 0.5 MG/DOSE) 2 MG/1.5ML ~~LOC~~ SOPN
0.2500 mg | PEN_INJECTOR | SUBCUTANEOUS | 0 refills | Status: DC
Start: 2021-08-20 — End: 2021-09-02

## 2021-08-20 MED ORDER — VENLAFAXINE HCL ER 150 MG PO CP24: ORAL_CAPSULE | ORAL | 3 refills | Status: DC

## 2021-08-20 NOTE — Progress Notes (Signed)
Chief Complaint:   OBESITY Sadi is here to discuss her progress with her obesity treatment plan along with follow-up of her obesity related diagnoses. Kearsten is on practicing portion control and making smarter food choices, such as increasing vegetables and decreasing simple carbohydrates and following a lower carbohydrate, vegetable and lean protein rich diet plan and states she is following her eating plan approximately 60% of the time. Tameko states she is walking for 20-30 minutes 4-5 times per week.  Today's visit was #: 5 Starting weight: 203 lbs Starting date: 05/22/2021 Today's weight: 205 lbs Today's date: 08/20/2021 Total lbs lost to date: 0 Total lbs lost since last in-office visit: 0  Interim History: Ziara is up 1 lb since her last visit. She has been out of town for 3 weeks. She is up 1.2 lbs of water pre the bio-impedence scale.   Subjective:   1. Other hyperlipidemia Luva is currently taking Crestor.   2. Prediabetes Scarlettrose is not on medications currently. She has tried [place], Metformin and Phentermine in the past.   3. Polyphagia Jamil notes increase appetite.   4. Other disorder of eating Virtie notes night time eating. She has tried Wellbutrin, it was not effective. She has tried Topamax had to stop due to side effects.   Assessment/Plan:   1. Other hyperlipidemia Cardiovascular risk and specific lipid/LDL goals reviewed.  Irlene will continue Crestor. We discussed several lifestyle modifications today and Jandi will continue to work on diet, exercise and weight loss efforts. Orders and follow up as documented in patient record.   Counseling Intensive lifestyle modifications are the first line treatment for this issue. Dietary changes: Increase soluble fiber. Decrease simple carbohydrates. Exercise changes: Moderate to vigorous-intensity aerobic activity 150 minutes per week if tolerated. Lipid-lowering medications: see documented in medical  record.  2. Prediabetes Nyima will continue to work on weight loss, exercise, and decreasing simple carbohydrates to help decrease the risk of diabetes. Celsa will continue Ozempic.    3. Polyphagia Intensive lifestyle modifications are the first line treatment for this issue. We will refill Ozempic 0.25 mg for 1 month with no refills. Pre-authorization needed. We discussed several lifestyle modifications today and she will continue to work on diet, exercise and weight loss efforts. Orders and follow up as documented in patient record.  Counseling Polyphagia is excessive hunger. Causes can include: low blood sugars, hypERthyroidism, PMS, lack of sleep, stress, insulin resistance, diabetes, certain medications, and diets that are deficient in protein and fiber.    - Semaglutide,0.25 or 0.'5MG'$ /DOS, (OZEMPIC, 0.25 OR 0.5 MG/DOSE,) 2 MG/1.5ML SOPN; Inject 0.25 mg into the skin once a week.  Dispense: 1.5 mL; Refill: 0  4. Other disorder of eating We discussed distraction techniques. Behavior modification techniques were discussed today to help Kaydi deal with her emotional/non-hunger eating behaviors.  Orders and follow up as documented in patient record.   5. Obesity, Current BMI 33.1 Monaye is currently in the action stage of change. As such, her goal is to continue with weight loss efforts. She has agreed to practicing portion control and making smarter food choices, such as increasing vegetables and decreasing simple carbohydrates plus lower carbohydrates.   Albertia will continue meal planning and she will continue intentional eating.    Exercise goals:  As is.   Behavioral modification strategies: increasing lean protein intake, decreasing simple carbohydrates, increasing vegetables, increasing water intake, decreasing eating out, no skipping meals, meal planning and cooking strategies, keeping healthy foods in the home,  and planning for success.  Ceriah has agreed to follow-up with our  clinic in 2-3 weeks. She was informed of the importance of frequent follow-up visits to maximize her success with intensive lifestyle modifications for her multiple health conditions.   Objective:   Blood pressure 133/82, pulse 73, temperature 97.9 F (36.6 C), height '5\' 6"'$  (1.676 m), weight 205 lb (93 kg), SpO2 93 %. Body mass index is 33.09 kg/m.  General: Cooperative, alert, well developed, in no acute distress. HEENT: Conjunctivae and lids unremarkable. Cardiovascular: Regular rhythm.  Lungs: Normal work of breathing. Neurologic: No focal deficits.   Lab Results  Component Value Date   CREATININE 0.85 06/30/2021   BUN 15 06/30/2021   NA 142 06/30/2021   K 4.4 06/30/2021   CL 103 06/30/2021   CO2 23 06/30/2021   Lab Results  Component Value Date   ALT 30 06/26/2021   AST 26 06/26/2021   ALKPHOS 72 06/26/2021   BILITOT 0.4 06/26/2021   Lab Results  Component Value Date   HGBA1C 5.7 (H) 05/22/2021   HGBA1C 5.7 (H) 02/28/2020   HGBA1C 5.7 (H) 10/16/2019   HGBA1C 5.7 (H) 06/19/2014   Lab Results  Component Value Date   INSULIN 15.5 05/22/2021   INSULIN 10.5 02/28/2020   INSULIN 11.1 10/16/2019   Lab Results  Component Value Date   TSH 1.020 10/16/2019   Lab Results  Component Value Date   CHOL 128 06/30/2021   HDL 83 06/30/2021   LDLCALC 33 06/30/2021   TRIG 54 06/30/2021   CHOLHDL 1.5 06/30/2021   Lab Results  Component Value Date   VD25OH 32.7 02/28/2020   VD25OH 28.1 (L) 10/16/2019   Lab Results  Component Value Date   WBC 5.6 06/26/2021   HGB 12.5 06/26/2021   HCT 38.8 06/26/2021   MCV 90.9 06/26/2021   PLT 294 06/26/2021   No results found for: IRON, TIBC, FERRITIN  Attestation Statements:   Reviewed by clinician on day of visit: allergies, medications, problem list, medical history, surgical history, family history, social history, and previous encounter notes.  I, Lizbeth Bark, RMA, am acting as Location manager for [employer], DO.  I  have reviewed the above documentation for accuracy and completeness, and I agree with the above. Jearld Lesch, DO

## 2021-08-21 ENCOUNTER — Telehealth (INDEPENDENT_AMBULATORY_CARE_PROVIDER_SITE_OTHER): Payer: Self-pay | Admitting: Bariatrics

## 2021-08-21 ENCOUNTER — Telehealth (INDEPENDENT_AMBULATORY_CARE_PROVIDER_SITE_OTHER): Payer: Self-pay

## 2021-08-21 ENCOUNTER — Other Ambulatory Visit (INDEPENDENT_AMBULATORY_CARE_PROVIDER_SITE_OTHER): Payer: Self-pay | Admitting: Bariatrics

## 2021-08-21 DIAGNOSIS — R632 Polyphagia: Secondary | ICD-10-CM

## 2021-08-21 NOTE — Telephone Encounter (Signed)
Prior authorization started for Ozempic. Will notify patient and provider once response is received. Thanks!!

## 2021-08-21 NOTE — Telephone Encounter (Signed)
Ok thank you. Patient was notified

## 2021-08-21 NOTE — Telephone Encounter (Signed)
Pharmacy is stating PA is needed for Ozempic. Can you please advise?

## 2021-08-21 NOTE — Telephone Encounter (Signed)
Pt calling concerning medication that was sent in for her. The pharmacy stated they sent a fax to Dr. Owens Shark to authorize and a fax sheet was returned that stated "requires pt okay" and pt is confused on what that means and what she needs to do on her end. The best call back number is [phone].   Last appt 6/7 with Owens Shark

## 2021-08-21 NOTE — Telephone Encounter (Signed)
Attempted to contact patient. Voicemail is full and couldn't leave a voicemail

## 2021-08-25 ENCOUNTER — Encounter (INDEPENDENT_AMBULATORY_CARE_PROVIDER_SITE_OTHER): Payer: Self-pay | Admitting: Bariatrics

## 2021-08-25 ENCOUNTER — Encounter (INDEPENDENT_AMBULATORY_CARE_PROVIDER_SITE_OTHER): Payer: Self-pay

## 2021-08-25 ENCOUNTER — Telehealth (INDEPENDENT_AMBULATORY_CARE_PROVIDER_SITE_OTHER): Payer: Self-pay | Admitting: Bariatrics

## 2021-08-25 NOTE — Telephone Encounter (Signed)
Patient called in and said when she spoke to Dr. Owens Shark at her last visit if the medication was declined by her insurance she would send in another medication. Please advise.

## 2021-08-25 NOTE — Telephone Encounter (Signed)
Notified patient of Dr. Owens Shark recommendation. Patient verbalized understanding.

## 2021-08-25 NOTE — Telephone Encounter (Signed)
Dr. Owens Shark can you please advise? This patient was seen last week 08/20/21.

## 2021-08-25 NOTE — Telephone Encounter (Signed)
Dr. Owens Shark - Prior authorization denied for Ozempic. Per inusrance: Ozempic (semaglutide) is considered medically necessary when there is documentation of failure, contraindication or intolerance to Trulicity (dulaglutide). Patient sent denial message via mychart.

## 2021-08-25 NOTE — Telephone Encounter (Signed)
Dr.Brown 

## 2021-08-26 ENCOUNTER — Ambulatory Visit (INDEPENDENT_AMBULATORY_CARE_PROVIDER_SITE_OTHER): Payer: Commercial Managed Care - HMO | Admitting: Bariatrics

## 2021-08-27 ENCOUNTER — Telehealth (INDEPENDENT_AMBULATORY_CARE_PROVIDER_SITE_OTHER): Payer: Self-pay | Admitting: Family Medicine

## 2021-09-02 ENCOUNTER — Ambulatory Visit (INDEPENDENT_AMBULATORY_CARE_PROVIDER_SITE_OTHER): Payer: Commercial Managed Care - HMO | Admitting: Bariatrics

## 2021-09-02 VITALS — BP 135/86 | HR 67 | Temp 97.7°F | Ht 66.0 in | Wt 208.0 lb

## 2021-09-02 DIAGNOSIS — Z6833 Body mass index (BMI) 33.0-33.9, adult: Secondary | ICD-10-CM | POA: Diagnosis not present

## 2021-09-02 DIAGNOSIS — R7303 Prediabetes: Secondary | ICD-10-CM | POA: Diagnosis not present

## 2021-09-02 DIAGNOSIS — E669 Obesity, unspecified: Secondary | ICD-10-CM | POA: Diagnosis not present

## 2021-09-02 DIAGNOSIS — R632 Polyphagia: Secondary | ICD-10-CM | POA: Diagnosis not present

## 2021-09-02 MED ORDER — TRULICITY 0.75 MG/0.5ML ~~LOC~~ SOAJ: 0.7500 mg | SUBCUTANEOUS | 0 refills | Status: DC

## 2021-09-02 MED ORDER — PRALUENT 75 MG/ML ~~LOC~~ SOAJ: 1.0000 | SUBCUTANEOUS | 11 refills | Status: DC

## 2021-09-02 NOTE — Addendum Note (Signed)
Addended by: Marcelle Overlie D on: 09/02/2021 05:33 PM   Modules accepted: Orders

## 2021-09-03 NOTE — Progress Notes (Unsigned)
Chief Complaint:   OBESITY Cythina is here to discuss her progress with her obesity treatment plan along with follow-up of her obesity related diagnoses. Hiliary is on practicing portion control and making smarter food choices, such as increasing vegetables and decreasing simple carbohydrates and states she is following her eating plan approximately 0% of the time. Sheriann states she is walking for 20 minutes 2-3 times per week.  Today's visit was #: 6 Starting weight: 203 lbs Starting date: 05/22/2021 Today's weight: 208 lbs Today's date: 09/02/2021 Total lbs lost to date: 0 Total lbs lost since last in-office visit: 0  Interim History: Jyoti is up 3 pounds since her last visit.  Subjective:   1. Polyphagia Apryle notes she is hungry in the evening.  2. Prediabetes Denisse is working on decreasing carbohydrates.  Assessment/Plan:   1. Polyphagia Fany agreed to start Trulicity 5.85 mg once weekly, with no refills.  We will follow-up at her next visit.  - Dulaglutide (TRULICITY) 2.77 OE/4.2PN SOPN; Inject 0.75 mg into the skin once a week.  Dispense: 2 mL; Refill: 0  2. Prediabetes Danasha will try Trulicity and we discussed side effects.  3. Obesity, Current BMI 33.6 Kinzlee is currently in the action stage of change. As such, her goal is to continue with weight loss efforts. She has agreed to practicing portion control and making smarter food choices, such as increasing vegetables and decreasing simple carbohydrates.   Kateland will adhere to around 1200 cal and 80 g of protein daily.  Protein shake (Premier protein shake).   Exercise goals: As is.   Behavioral modification strategies: increasing lean protein intake, decreasing simple carbohydrates, increasing vegetables, increasing water intake, decreasing eating out, no skipping meals, meal planning and cooking strategies, keeping healthy foods in the home, and planning for success.  Kahliya has agreed to follow-up with  our clinic in 2 to 3 weeks. She was informed of the importance of frequent follow-up visits to maximize her success with intensive lifestyle modifications for her multiple health conditions.   Objective:   Blood pressure 135/86, pulse 67, temperature 97.7 F (36.5 C), height '5\' 6"'$  (1.676 m), weight 208 lb (94.3 kg), SpO2 93 %. Body mass index is 33.57 kg/m.  General: Cooperative, alert, well developed, in no acute distress. HEENT: Conjunctivae and lids unremarkable. Cardiovascular: Regular rhythm.  Lungs: Normal work of breathing. Neurologic: No focal deficits.   Lab Results  Component Value Date   CREATININE 0.85 06/30/2021   BUN 15 06/30/2021   NA 142 06/30/2021   K 4.4 06/30/2021   CL 103 06/30/2021   CO2 23 06/30/2021   Lab Results  Component Value Date   ALT 30 06/26/2021   AST 26 06/26/2021   ALKPHOS 72 06/26/2021   BILITOT 0.4 06/26/2021   Lab Results  Component Value Date   HGBA1C 5.7 (H) 05/22/2021   HGBA1C 5.7 (H) 02/28/2020   HGBA1C 5.7 (H) 10/16/2019   HGBA1C 5.7 (H) 06/19/2014   Lab Results  Component Value Date   INSULIN 15.5 05/22/2021   INSULIN 10.5 02/28/2020   INSULIN 11.1 10/16/2019   Lab Results  Component Value Date   TSH 1.020 10/16/2019   Lab Results  Component Value Date   CHOL 128 06/30/2021   HDL 83 06/30/2021   LDLCALC 33 06/30/2021   TRIG 54 06/30/2021   CHOLHDL 1.5 06/30/2021   Lab Results  Component Value Date   VD25OH 32.7 02/28/2020   VD25OH 28.1 (L) 10/16/2019  Lab Results  Component Value Date   WBC 5.6 06/26/2021   HGB 12.5 06/26/2021   HCT 38.8 06/26/2021   MCV 90.9 06/26/2021   PLT 294 06/26/2021   No results found for: "IRON", "TIBC", "FERRITIN"  Attestation Statements:   Reviewed by clinician on day of visit: allergies, medications, problem list, medical history, surgical history, family history, social history, and previous encounter notes.   Wilhemena Durie, am acting as Location manager for [employer], DO.  I have reviewed the above documentation for accuracy and completeness, and I agree with the above. -  ***

## 2021-09-04 ENCOUNTER — Telehealth: Payer: Self-pay | Admitting: Pharmacist

## 2021-09-09 ENCOUNTER — Telehealth (INDEPENDENT_AMBULATORY_CARE_PROVIDER_SITE_OTHER): Payer: Self-pay | Admitting: Family Medicine

## 2021-09-09 ENCOUNTER — Encounter: Payer: Self-pay | Admitting: Pharmacist

## 2021-09-09 MED ORDER — REPATHA SURECLICK 140 MG/ML ~~LOC~~ SOAJ: 1.0000 | SUBCUTANEOUS | 11 refills | Status: DC

## 2021-09-09 NOTE — Progress Notes (Signed)
Patient in [place]-unable to complete virtual visit due to licensing issues.

## 2021-09-09 NOTE — Telephone Encounter (Signed)
Repatha approved through 09/05/22. Attempted to call pt. Went right to VM and VM full. Mychart message sent. Copay card activated.  RxBin: [id number] RxPCN: [id number] RxGrp: [id number] ID: [id number]

## 2021-09-10 ENCOUNTER — Telehealth (INDEPENDENT_AMBULATORY_CARE_PROVIDER_SITE_OTHER): Payer: Self-pay | Admitting: Family Medicine

## 2021-09-10 DIAGNOSIS — E669 Obesity, unspecified: Secondary | ICD-10-CM

## 2021-09-10 DIAGNOSIS — Z6830 Body mass index (BMI) 30.0-30.9, adult: Secondary | ICD-10-CM

## 2021-09-15 ENCOUNTER — Encounter (INDEPENDENT_AMBULATORY_CARE_PROVIDER_SITE_OTHER): Payer: Self-pay | Admitting: Family Medicine

## 2021-09-15 ENCOUNTER — Telehealth (INDEPENDENT_AMBULATORY_CARE_PROVIDER_SITE_OTHER): Payer: Commercial Managed Care - HMO | Admitting: Family Medicine

## 2021-09-15 DIAGNOSIS — E669 Obesity, unspecified: Secondary | ICD-10-CM | POA: Diagnosis not present

## 2021-09-15 DIAGNOSIS — Z853 Personal history of malignant neoplasm of breast: Secondary | ICD-10-CM

## 2021-09-15 DIAGNOSIS — Z6833 Body mass index (BMI) 33.0-33.9, adult: Secondary | ICD-10-CM

## 2021-09-15 DIAGNOSIS — R7303 Prediabetes: Secondary | ICD-10-CM | POA: Diagnosis not present

## 2021-09-15 MED ORDER — TRULICITY 1.5 MG/0.5ML ~~LOC~~ SOAJ: 1.5000 mg | SUBCUTANEOUS | 0 refills | Status: DC

## 2021-09-15 NOTE — Progress Notes (Signed)
TeleHealth Visit:  This visit was completed with telemedicine (audio/video) technology. Bethany [name] has verbally consented to this TeleHealth visit. The patient is located at home, the provider is located at home. The participants in this visit include the listed provider and patient. The visit was conducted today via MyChart video.  OBESITY [name] is here to discuss her progress with her obesity treatment plan along with follow-up of her obesity related diagnoses.   Today's visit was # 7 Starting weight: 203 lbs Starting date: 05/22/2021 Weight at last in office visit: 208 lbs on 09/02/21 Total weight loss: 0 lbs at last in office visit on 09/02/21. Today's reported weight:  No weight reported.  Nutrition Plan: keeping a food journal and adhering to recommended goals of 1200 calories and 80 gms protein.   Hunger is moderately controlled. Cravings are moderately controlled.  Current exercise: walked [place] the beach  Interim History: Bethany [name] just got back from a trip to [place].  She had to put her 9 year old dog down this morning. She has been on the journaling plan but reports she is not able to journal consistently.  She believes she has untreated ADD. She tends to skip breakfast and lunch and then have a large dinner with snacking afterwards.  She is not hungry early in the day.  Assessment/Plan:  1. Prediabetes A1c elevated at 5.7. She admits to polyphagia. Medication(s): Started on Trulicity 6.01 mg weekly last office visit.  Notes no appetite suppression or side effects.  Insurance would not cover Trulicity until she tried [employer]. Lab Results  Component Value Date   HGBA1C 5.7 (H) 05/22/2021   Lab Results  Component Value Date   INSULIN 15.5 05/22/2021   INSULIN 10.5 02/28/2020   INSULIN 11.1 10/16/2019    Plan: Increase dose of Trulicity. Refill Trulicity 1.5 mg weekly.  2.  History of left breast cancer Bethany [name] had estrogen receptor positive left breast cancer in  2019.  She had subsequent lumpectomy and axillary node dissection (nodes were negative for metastasis).  She had radiation but no chemo was indicated.  She was prescribed and tried antiestrogen therapy but has been unable to tolerate due to side effects despite being on several different medications.  Currently she follows up with oncology every 6 months.  Plan: Follow-up with oncology as directed.  2. Obesity: Current BMI 33.5 [name] is currently in the action stage of change. As such, her goal is to continue with weight loss efforts.  She has agreed to following a lower carbohydrate, vegetable and lean protein rich diet plan.   Low-carb plan sent via MyChart.  Exercise goals: No exercise has been prescribed at this time.  Behavioral modification strategies: increasing lean protein intake, decreasing simple carbohydrates, and meal planning and cooking strategies.  [name] has agreed to follow-up with our clinic in 3 weeks.   No orders of the defined types were placed in this encounter.   Medications Discontinued During This Encounter  Medication Reason   Dulaglutide (TRULICITY) 0.93 AT/5.5DD SOPN Dose change     Meds ordered this encounter  Medications   Dulaglutide (TRULICITY) 1.5 UK/0.2RK SOPN    Sig: Inject 1.5 mg into the skin once a week.    Dispense:  2 mL    Refill:  0    Order Specific Question:   Supervising Provider    Answer:   Georgia Lopes [[id number]]      Objective:   VITALS: Per patient if applicable, see vitals. GENERAL: Alert and in no acute distress.  CARDIOPULMONARY: No increased WOB. Speaking in clear sentences.  PSYCH: Pleasant and cooperative. Speech normal rate and rhythm. Affect is appropriate. Insight and judgement are appropriate. Attention is focused, linear, and appropriate.  NEURO: Oriented as arrived to appointment on time with no prompting.   Lab Results  Component Value Date   CREATININE 0.85 06/30/2021   BUN 15 06/30/2021   NA 142  06/30/2021   K 4.4 06/30/2021   CL 103 06/30/2021   CO2 23 06/30/2021   Lab Results  Component Value Date   ALT 30 06/26/2021   AST 26 06/26/2021   ALKPHOS 72 06/26/2021   BILITOT 0.4 06/26/2021   Lab Results  Component Value Date   HGBA1C 5.7 (H) 05/22/2021   HGBA1C 5.7 (H) 02/28/2020   HGBA1C 5.7 (H) 10/16/2019   HGBA1C 5.7 (H) 06/19/2014   Lab Results  Component Value Date   INSULIN 15.5 05/22/2021   INSULIN 10.5 02/28/2020   INSULIN 11.1 10/16/2019   Lab Results  Component Value Date   TSH 1.020 10/16/2019   Lab Results  Component Value Date   CHOL 128 06/30/2021   HDL 83 06/30/2021   LDLCALC 33 06/30/2021   TRIG 54 06/30/2021   CHOLHDL 1.5 06/30/2021   Lab Results  Component Value Date   WBC 5.6 06/26/2021   HGB 12.5 06/26/2021   HCT 38.8 06/26/2021   MCV 90.9 06/26/2021   PLT 294 06/26/2021   No results found for: "IRON", "TIBC", "FERRITIN" Lab Results  Component Value Date   VD25OH 32.7 02/28/2020   VD25OH 28.1 (L) 10/16/2019    Attestation Statements:   Reviewed by clinician on day of visit: allergies, medications, problem list, medical history, surgical history, family history, social history, and previous encounter notes.

## 2021-09-18 ENCOUNTER — Encounter: Payer: Self-pay | Admitting: Pharmacist

## 2021-09-30 ENCOUNTER — Encounter (INDEPENDENT_AMBULATORY_CARE_PROVIDER_SITE_OTHER): Payer: Self-pay | Admitting: Bariatrics

## 2021-09-30 ENCOUNTER — Ambulatory Visit (INDEPENDENT_AMBULATORY_CARE_PROVIDER_SITE_OTHER): Payer: Commercial Managed Care - HMO | Admitting: Bariatrics

## 2021-09-30 VITALS — BP 127/79 | HR 81 | Temp 97.7°F | Ht 66.0 in | Wt 204.0 lb

## 2021-09-30 DIAGNOSIS — R7303 Prediabetes: Secondary | ICD-10-CM | POA: Diagnosis not present

## 2021-09-30 DIAGNOSIS — F5089 Other specified eating disorder: Secondary | ICD-10-CM

## 2021-09-30 DIAGNOSIS — R632 Polyphagia: Secondary | ICD-10-CM

## 2021-09-30 DIAGNOSIS — Z6833 Body mass index (BMI) 33.0-33.9, adult: Secondary | ICD-10-CM

## 2021-09-30 DIAGNOSIS — I10 Essential (primary) hypertension: Secondary | ICD-10-CM | POA: Diagnosis not present

## 2021-09-30 DIAGNOSIS — E669 Obesity, unspecified: Secondary | ICD-10-CM

## 2021-09-30 MED ORDER — QSYMIA 7.5-46 MG PO CP24: ORAL_CAPSULE | ORAL | 0 refills | Status: DC

## 2021-10-01 ENCOUNTER — Ambulatory Visit (INDEPENDENT_AMBULATORY_CARE_PROVIDER_SITE_OTHER): Payer: Commercial Managed Care - HMO | Admitting: Bariatrics

## 2021-10-06 NOTE — Progress Notes (Unsigned)
Chief Complaint:   OBESITY Bethany [name] is here to discuss her progress with her obesity treatment plan along with follow-up of her obesity related diagnoses. Bethany [name] is on following a lower carbohydrate, vegetable and lean protein rich diet plan and states she is following her eating plan approximately 70% of the time. Bethany [name] states she is walking for 30 minutes 2-3 times per week.  Today's visit was #: 8 Starting weight: 203 lbs Starting date: 05/22/2021 Today's weight: 204 lbs Today's date: 09/30/2021 Total lbs lost to date: 0 Total lbs lost since last in-office visit: 4  Interim History: Bethany [name] is down 4 pounds since her last visit.  She is eating fewer carbohydrates.  Subjective:   1. Prediabetes [name] is currently taking Trulicity.  2. Essential hypertension [name]'s blood pressure is controlled.  3. Polyphagia [name] tried metformin, Wellbutrin, and Topamax.  4. Other disorder of eating [name] notes cravings.  Reviewed the PDMP.  Assessment/Plan:   1. Prediabetes Bethany [name] will keep her carbohydrates low.  2. Essential hypertension [name] will continue her medications as directed.  3. [name] agreed to start Qsymia 7.5-46 mg once daily, with no refills.  She will decrease all carbohydrates.  - Phentermine-Topiramate (QSYMIA) 7.5-46 MG CP24; TAKE 1 CAPSULE DAILY  Dispense: 30 capsule; Refill: 0  4. Other disorder of eating [name] agreed to start Qsymia 7.5-46 mg once daily, with no refills.  She will decrease all carbohydrates.  - Phentermine-Topiramate (QSYMIA) 7.5-46 MG CP24; TAKE 1 CAPSULE DAILY  Dispense: 30 capsule; Refill: 0  5. Obesity, current BMI 33.0 Bethany [name] is currently in the action stage of change. As such, her goal is to continue with weight loss efforts. She has agreed to practicing portion control and making smarter food choices, such as increasing vegetables and decreasing simple carbohydrates.  Meal planning and intentional eating were  discussed.  Exercise goals: As is.  Behavioral modification strategies: increasing lean protein intake, decreasing simple carbohydrates, increasing vegetables, increasing water intake, decreasing eating out, no skipping meals, meal planning and cooking strategies, keeping healthy foods in the home, and planning for success.  [name] has agreed to follow-up with our clinic in 4 weeks with myself or with [facility], FNP-C. She was informed of the importance of frequent follow-up visits to maximize her success with intensive lifestyle modifications for her multiple health conditions.   Objective:   Blood pressure 127/79, pulse 81, temperature 97.7 F (36.5 C), height '5\' 6"'$  (1.676 m), weight 204 lb (92.5 kg), SpO2 (!) 9 %. Body mass index is 32.93 kg/m.  General: Cooperative, alert, well developed, in no acute distress. HEENT: Conjunctivae and lids unremarkable. Cardiovascular: Regular rhythm.  Lungs: Normal work of breathing. Neurologic: No focal deficits.   Lab Results  Component Value Date   CREATININE 0.85 06/30/2021   BUN 15 06/30/2021   NA 142 06/30/2021   K 4.4 06/30/2021   CL 103 06/30/2021   CO2 23 06/30/2021   Lab Results  Component Value Date   ALT 30 06/26/2021   AST 26 06/26/2021   ALKPHOS 72 06/26/2021   BILITOT 0.4 06/26/2021   Lab Results  Component Value Date   HGBA1C 5.7 (H) 05/22/2021   HGBA1C 5.7 (H) 02/28/2020   HGBA1C 5.7 (H) 10/16/2019   HGBA1C 5.7 (H) 06/19/2014   Lab Results  Component Value Date   INSULIN 15.5 05/22/2021   INSULIN 10.5 02/28/2020   INSULIN 11.1 10/16/2019   Lab Results  Component Value Date   TSH 1.020 10/16/2019  Lab Results  Component Value Date   CHOL 128 06/30/2021   HDL 83 06/30/2021   LDLCALC 33 06/30/2021   TRIG 54 06/30/2021   CHOLHDL 1.5 06/30/2021   Lab Results  Component Value Date   VD25OH 32.7 02/28/2020   VD25OH 28.1 (L) 10/16/2019   Lab Results  Component Value Date   WBC 5.6 06/26/2021    HGB 12.5 06/26/2021   HCT 38.8 06/26/2021   MCV 90.9 06/26/2021   PLT 294 06/26/2021   No results found for: "IRON", "TIBC", "FERRITIN"  Attestation Statements:   Reviewed by clinician on day of visit: allergies, medications, problem list, medical history, surgical history, family history, social history, and previous encounter notes.   Wilhemena Durie, am acting as Location manager for [employer], DO.  I have reviewed the above documentation for accuracy and completeness, and I agree with the above. Jearld Lesch, DO

## 2021-10-07 ENCOUNTER — Encounter (INDEPENDENT_AMBULATORY_CARE_PROVIDER_SITE_OTHER): Payer: Self-pay | Admitting: Bariatrics

## 2021-10-20 NOTE — Progress Notes (Signed)
TeleHealth Visit:  This visit was completed with telemedicine (audio/video) technology. Bethany [name] has verbally consented to this TeleHealth visit. The patient is located at home, the provider is located at home. The participants in this visit include the listed provider and patient. The visit was conducted today via MyChart video.  OBESITY Bethany [name] is here to discuss her progress with her obesity treatment plan along with follow-up of her obesity related diagnoses.   Today's visit was # 9 Starting weight: 203 lbs Starting date: 05/22/2021 Weight at last in office visit: 204 lbs on 09/30/21 Total weight loss: 0 lbs at last in office visit on 09/30/21. Today's reported  No weight reported.  Nutrition Plan: practicing portion control and making smarter food choices, such as increasing vegetables and decreasing simple carbohydrates.  Hunger is poorly controlled. Cravings are poorly controlled.  Current exercise: walking 1.5 miles daily.  Interim History: Bethany [name] is really struggling with night eating.  She is skipping breakfast and lunch most of the time, eating dinner and then snacking after dinner. She says both her hunger and cravings are poorly controlled. 1 change she has made over the past few weeks that she is walking a mile and a half daily.  Assessment/Plan:  1. Vitamin D Deficiency Vitamin D is not at goal of 50.  Last vitamin D was low at 24 on 09/29/2021 at her PCP Deboraha Sprang). She is on weekly prescription Vitamin D 50,000 IU.  Lab Results  Component Value Date   VD25OH 32.7 02/28/2020   VD25OH 28.1 (L) 10/16/2019    Plan: Refill prescription vitamin D 50,000 IU weekly.   2. Eating disorder/emotional eating She is still struggling with eating after dinner. Overall mood is stable. Denies suicidal/homicidal ideation. Medication(s): Bethany [name] was started on the 7.5/46 mg dose of Qsymia by Dr. Manson Passey.  She is not having any appetite suppression or relief from her night cravings.   Denies any side effects. She has been on 37.5 mg of phentermine in the past and lost weight while taking it. PDMP reviewed-no aberrations noted.  Plan: Increase dose of Qsymia.   Dr. Manson Passey has sent in a prescription for Qsymia 11.25/69 mg daily.  3. Prediabetes Last A1c was elevated at 5.7. Medication(s): Trulicity 1.5 mg weekly.  No appetite suppression noted.  Denies side effects. Lab Results  Component Value Date   HGBA1C 5.7 (H) 05/22/2021   Lab Results  Component Value Date   INSULIN 15.5 05/22/2021   INSULIN 10.5 02/28/2020   INSULIN 11.1 10/16/2019    Plan: Increase dose of Trulicity. Refill Trulicity 3 mg weekly.   4. Obesity: Current BMI 32.9 Bethany [name] is currently in the action stage of change. As such, her goal is to continue with weight loss efforts.  She has agreed to practicing portion control and making smarter food choices, such as increasing vegetables and decreasing simple carbohydrates.   Exercise goals: as is  Behavioral modification strategies: increasing lean protein intake, decreasing simple carbohydrates, and no skipping meals. Discussed importance of eating regular meals for better metabolism.  Also discussed the importance of protein.  Bethany [name] has agreed to follow-up with our clinic in 2 weeks.   No orders of the defined types were placed in this encounter.   There are no discontinued medications.   No orders of the defined types were placed in this encounter.     Objective:   VITALS: Per patient if applicable, see vitals. GENERAL: Alert and in no acute distress. CARDIOPULMONARY: No increased WOB. Speaking in clear sentences.  PSYCH: Pleasant and cooperative. Speech normal rate and rhythm. Affect is appropriate. Insight and judgement are appropriate. Attention is focused, linear, and appropriate.  NEURO: Oriented as arrived to appointment on time with no prompting.   Lab Results  Component Value Date   CREATININE 0.85 06/30/2021   BUN 15  06/30/2021   NA 142 06/30/2021   K 4.4 06/30/2021   CL 103 06/30/2021   CO2 23 06/30/2021   Lab Results  Component Value Date   ALT 30 06/26/2021   AST 26 06/26/2021   ALKPHOS 72 06/26/2021   BILITOT 0.4 06/26/2021   Lab Results  Component Value Date   HGBA1C 5.7 (H) 05/22/2021   HGBA1C 5.7 (H) 02/28/2020   HGBA1C 5.7 (H) 10/16/2019   HGBA1C 5.7 (H) 06/19/2014   Lab Results  Component Value Date   INSULIN 15.5 05/22/2021   INSULIN 10.5 02/28/2020   INSULIN 11.1 10/16/2019   Lab Results  Component Value Date   TSH 1.020 10/16/2019   Lab Results  Component Value Date   CHOL 128 06/30/2021   HDL 83 06/30/2021   LDLCALC 33 06/30/2021   TRIG 54 06/30/2021   CHOLHDL 1.5 06/30/2021   Lab Results  Component Value Date   WBC 5.6 06/26/2021   HGB 12.5 06/26/2021   HCT 38.8 06/26/2021   MCV 90.9 06/26/2021   PLT 294 06/26/2021   No results found for: "IRON", "TIBC", "FERRITIN" Lab Results  Component Value Date   VD25OH 32.7 02/28/2020   VD25OH 28.1 (L) 10/16/2019    Attestation Statements:   Reviewed by clinician on day of visit: allergies, medications, problem list, medical history, surgical history, family history, social history, and previous encounter notes.

## 2021-10-21 ENCOUNTER — Telehealth (INDEPENDENT_AMBULATORY_CARE_PROVIDER_SITE_OTHER): Payer: Commercial Managed Care - HMO | Admitting: Family Medicine

## 2021-10-21 ENCOUNTER — Other Ambulatory Visit (INDEPENDENT_AMBULATORY_CARE_PROVIDER_SITE_OTHER): Payer: Self-pay | Admitting: Bariatrics

## 2021-10-21 ENCOUNTER — Encounter (INDEPENDENT_AMBULATORY_CARE_PROVIDER_SITE_OTHER): Payer: Self-pay | Admitting: Family Medicine

## 2021-10-21 DIAGNOSIS — E559 Vitamin D deficiency, unspecified: Secondary | ICD-10-CM

## 2021-10-21 DIAGNOSIS — F5089 Other specified eating disorder: Secondary | ICD-10-CM

## 2021-10-21 DIAGNOSIS — R7303 Prediabetes: Secondary | ICD-10-CM

## 2021-10-21 DIAGNOSIS — E669 Obesity, unspecified: Secondary | ICD-10-CM

## 2021-10-21 DIAGNOSIS — Z6832 Body mass index (BMI) 32.0-32.9, adult: Secondary | ICD-10-CM

## 2021-10-21 MED ORDER — TRULICITY 3 MG/0.5ML ~~LOC~~ SOAJ: 3.0000 mg | SUBCUTANEOUS | 0 refills | Status: DC

## 2021-10-21 MED ORDER — QSYMIA 11.25-69 MG PO CP24: ORAL_CAPSULE | ORAL | 0 refills | Status: DC

## 2021-10-21 MED ORDER — VITAMIN D (ERGOCALCIFEROL) 1.25 MG (50000 UNIT) PO CAPS: 50000.0000 [IU] | ORAL_CAPSULE | ORAL | 0 refills | Status: DC

## 2021-10-22 ENCOUNTER — Encounter (INDEPENDENT_AMBULATORY_CARE_PROVIDER_SITE_OTHER): Payer: Self-pay

## 2021-11-03 ENCOUNTER — Ambulatory Visit (INDEPENDENT_AMBULATORY_CARE_PROVIDER_SITE_OTHER): Payer: Commercial Managed Care - HMO | Admitting: Bariatrics

## 2021-11-10 ENCOUNTER — Encounter (INDEPENDENT_AMBULATORY_CARE_PROVIDER_SITE_OTHER): Payer: Self-pay | Admitting: Bariatrics

## 2021-11-10 ENCOUNTER — Other Ambulatory Visit (INDEPENDENT_AMBULATORY_CARE_PROVIDER_SITE_OTHER): Payer: Self-pay | Admitting: Bariatrics

## 2021-11-10 ENCOUNTER — Ambulatory Visit (INDEPENDENT_AMBULATORY_CARE_PROVIDER_SITE_OTHER): Payer: Commercial Managed Care - HMO | Admitting: Bariatrics

## 2021-11-10 VITALS — BP 127/79 | HR 75 | Temp 97.9°F | Ht 66.0 in | Wt 202.0 lb

## 2021-11-10 DIAGNOSIS — I10 Essential (primary) hypertension: Secondary | ICD-10-CM | POA: Insufficient documentation

## 2021-11-10 DIAGNOSIS — R7303 Prediabetes: Secondary | ICD-10-CM

## 2021-11-10 DIAGNOSIS — E669 Obesity, unspecified: Secondary | ICD-10-CM | POA: Diagnosis not present

## 2021-11-10 DIAGNOSIS — E559 Vitamin D deficiency, unspecified: Secondary | ICD-10-CM | POA: Diagnosis not present

## 2021-11-10 DIAGNOSIS — Z6832 Body mass index (BMI) 32.0-32.9, adult: Secondary | ICD-10-CM

## 2021-11-10 MED ORDER — VITAMIN D (ERGOCALCIFEROL) 1.25 MG (50000 UNIT) PO CAPS: 50000.0000 [IU] | ORAL_CAPSULE | ORAL | 0 refills | Status: DC

## 2021-11-10 MED ORDER — PHENTERMINE HCL 15 MG PO CAPS
15.0000 mg | ORAL_CAPSULE | Freq: Every day | ORAL | 0 refills | Status: DC
Start: 2021-11-10 — End: 2021-11-19

## 2021-11-10 MED ORDER — TRULICITY 4.5 MG/0.5ML ~~LOC~~ SOAJ: 4.5000 mg | SUBCUTANEOUS | 0 refills | Status: DC

## 2021-11-11 ENCOUNTER — Encounter (INDEPENDENT_AMBULATORY_CARE_PROVIDER_SITE_OTHER): Payer: Self-pay

## 2021-11-11 ENCOUNTER — Telehealth (INDEPENDENT_AMBULATORY_CARE_PROVIDER_SITE_OTHER): Payer: Self-pay | Admitting: Bariatrics

## 2021-11-11 NOTE — Telephone Encounter (Signed)
Dr. Owens Shark - Prior authorization for Trulicity is not required per insurance. Patient sent message via mychart.

## 2021-11-13 ENCOUNTER — Encounter (INDEPENDENT_AMBULATORY_CARE_PROVIDER_SITE_OTHER): Payer: Self-pay | Admitting: Bariatrics

## 2021-11-13 NOTE — Progress Notes (Signed)
Chief Complaint:   OBESITY Bethany [name] is here to discuss her progress with her obesity treatment plan along with follow-up of her obesity related diagnoses. Indigo is on practicing portion control and making smarter food choices, such as increasing vegetables and decreasing simple carbohydrates and states she is following her eating plan approximately 80% of the time. Santiago states she is walking for 20 minutes 3-4 times per week.  Today's visit was #: 10 Starting weight: 203 lbs Starting date: 05/22/2021 Today's weight: 202 lbs Today's date: 11/10/21 Total lbs lost to date: 1 Total lbs lost since last in-office visit: -2  Interim History: She is down another 2 pounds since her last visit.  She will stop the Qsymia.  Subjective:   1. Essential hypertension Blood pressure controlled.  2. Prediabetes Taking Trulicity.  3. Vitamin D deficiency Taking vitamin D.  Assessment/Plan:   1. Essential hypertension 1. Continue exercise. 2.  Taking medication consistently.  2. Prediabetes Increase Trulicity dose from 3 mg to 4.5 mg. - Dulaglutide (TRULICITY) 4.5 IO/9.6EX SOPN; Inject 4.5 mg as directed once a week.  Dispense: 2 mL; Refill: 0  3. Vitamin D deficiency Refill - Vitamin D, Ergocalciferol, (DRISDOL) 1.25 MG (50000 UNIT) CAPS capsule; Take 1 capsule (50,000 Units total) by mouth every 7 (seven) days.  Dispense: 4 capsule; Refill: 0  4. Obesity, current BMI 32.7 1.  Meal planning 2.  Intentional eating 3.  Prescription for phentermine- - phentermine 15 MG capsule; Take 1 capsule (15 mg total) by mouth daily with lunch.  Dispense: 30 capsule; Refill: 0  Alany is currently in the action stage of change. As such, her goal is to continue with weight loss efforts. She has agreed to practicing portion control and making smarter food choices, such as increasing vegetables and decreasing simple carbohydrates- low carb.  Exercise goals: Increase walking.  Behavioral  modification strategies: increasing lean protein intake, decreasing simple carbohydrates, increasing vegetables, increasing water intake, decreasing eating out, no skipping meals, meal planning and cooking strategies, keeping healthy foods in the home, and planning for success.  Renae has agreed to follow-up with our clinic in 4 weeks with [employer] (virtual) and 8 weeks with me at [facility].Marland Kitchen She was informed of the importance of frequent follow-up visits to maximize her success with intensive lifestyle modifications for her multiple health conditions.   Objective:   Blood pressure 127/79, pulse 75, temperature 97.9 F (36.6 C), height '5\' 6"'$  (1.676 m), weight 202 lb (91.6 kg), SpO2 96 %. Body mass index is 32.6 kg/m.  General: Cooperative, alert, well developed, in no acute distress. HEENT: Conjunctivae and lids unremarkable. Cardiovascular: Regular rhythm.  Lungs: Normal work of breathing. Neurologic: No focal deficits.   Lab Results  Component Value Date   CREATININE 0.85 06/30/2021   BUN 15 06/30/2021   NA 142 06/30/2021   K 4.4 06/30/2021   CL 103 06/30/2021   CO2 23 06/30/2021   Lab Results  Component Value Date   ALT 30 06/26/2021   AST 26 06/26/2021   ALKPHOS 72 06/26/2021   BILITOT 0.4 06/26/2021   Lab Results  Component Value Date   HGBA1C 5.7 (H) 05/22/2021   HGBA1C 5.7 (H) 02/28/2020   HGBA1C 5.7 (H) 10/16/2019   HGBA1C 5.7 (H) 06/19/2014   Lab Results  Component Value Date   INSULIN 15.5 05/22/2021   INSULIN 10.5 02/28/2020   INSULIN 11.1 10/16/2019   Lab Results  Component Value Date   TSH 1.020 10/16/2019  Lab Results  Component Value Date   CHOL 128 06/30/2021   HDL 83 06/30/2021   LDLCALC 33 06/30/2021   TRIG 54 06/30/2021   CHOLHDL 1.5 06/30/2021   Lab Results  Component Value Date   VD25OH 32.7 02/28/2020   VD25OH 28.1 (L) 10/16/2019   Lab Results  Component Value Date   WBC 5.6 06/26/2021   HGB 12.5 06/26/2021   HCT  38.8 06/26/2021   MCV 90.9 06/26/2021   PLT 294 06/26/2021   No results found for: "IRON", "TIBC", "FERRITIN"   Attestation Statements:   Reviewed by clinician on day of visit: allergies, medications, problem list, medical history, surgical history, family history, social history, and previous encounter notes.  I, Dawn Whitmire, FNP-C, am acting as transcriptionist for Dr. Jearld Lesch.  I have reviewed the above documentation for accuracy and completeness, and I agree with the above. Jearld Lesch, DO

## 2021-11-18 NOTE — Progress Notes (Unsigned)
TeleHealth Visit:  This visit was completed with telemedicine (audio/video) technology. Bethany [name] has verbally consented to this TeleHealth visit. The patient is located at home, the provider is located at home. The participants in this visit include the listed provider and patient. The visit was conducted today via MyChart video.  OBESITY Bethany [name] is here to discuss her progress with her obesity treatment plan along with follow-up of her obesity related diagnoses.   Today's visit was # 11 Starting weight: 203 lbs Starting date: 05/22/2021 Weight at last in office visit: 202 lbs on 11/10/21 Total weight loss: 1 lbs at last in office visit on 11/10/21. Today's reported weight: 201 lbs   Nutrition Plan: practicing portion control and making smarter food choices, such as increasing vegetables and decreasing simple carbohydrates.   Current exercise: walking, not consistent.  Interim History: Bethany [name] is really struggling with hunger especially in the evening.  She is on Trulicity 3 mg weekly and phentermine 15 mg once daily at lunch. She is not getting enough protein.  She averages about 2 chicken breast and a half of a protein shake during the day.  She is not weighing out her meat. She has carb cravings in the evening.  Notes water intake is better than it was- about 3 bottles per day.. Weight goal is 170 pounds.  Assessment/Plan:  1. Prediabetes She is on 3 mg of Trulicity.  Providing very little appetite suppression.  Last A1c was 5.7 on 05/22/2021. Trulicity is not providing the therapeutic benefit that I would like.  She has been on it for about 3 months. Lab Results  Component Value Date   HGBA1C 5.7 (H) 05/22/2021   Lab Results  Component Value Date   INSULIN 15.5 05/22/2021   INSULIN 10.5 02/28/2020   INSULIN 11.1 10/16/2019    Plan: Discontinue Trulicity.   New prescription for Ozempic 1 mg weekly.  2. Polyphagia Appetite is not well controlled, especially in the evening  despite being on 15 mg of phentermine at lunchtime and 3 mg of Trulicity weekly.  In the past she has needed to be on 37.5 mg dose of phentermine for it to work.  She denies side effects from the phentermine other than dry mouth.  She does have insomnia if she takes it too late in the day. Blood pressure and pulse were normal at last office visit on 11/10/2021.  Plan: Increase dose of phentermine to 37.5 mg daily.  She may split the pill and take half in the morning and half at lunch. New prescription-phentermine 37.5 mg daily.   3. Obesity: Current BMI 32.7 Bethany [name] is currently in the action stage of change. As such, her goal is to continue with weight loss efforts.  She has agreed to following a lower carbohydrate, vegetable and lean protein rich diet plan.  Encouraged her to have a larger dinner with more protein to help with after dinner polyphagia and cravings.  Told her she needed to have about 10 ounces of meat per day and a whole protein shake rather than a half. Encouraged her to weigh her meat after its cooked.  Exercise goals: She will work on increasing her walking.  Behavioral modification strategies: increasing lean protein intake, decreasing simple carbohydrates, and increasing water intake.  Bethany [name] has agreed to follow-up with our clinic in 3 weeks.   No orders of the defined types were placed in this encounter.   Medications Discontinued During This Encounter  Medication Reason   phentermine 15 MG capsule Dose change  Dulaglutide (TRULICITY) 4.5 VP/7.1GG SOPN Change in therapy     Meds ordered this encounter  Medications   phentermine (ADIPEX-P) 37.5 MG tablet    Sig: Take 1 tablet (37.5 mg total) by mouth daily before breakfast.    Dispense:  30 tablet    Refill:  0    Order Specific Question:   Supervising Provider    Answer:   Dennard Nip D [[id number]]   Semaglutide, 1 MG/DOSE, 4 MG/3ML SOPN    Sig: Inject 1 mg as directed once a week.    Dispense:  3 mL     Refill:  0    Order Specific Question:   Supervising Provider    Answer:   Dennard Nip D [[id number]]      Objective:   VITALS: Per patient if applicable, see vitals. GENERAL: Alert and in no acute distress. CARDIOPULMONARY: No increased WOB. Speaking in clear sentences.  PSYCH: Pleasant and cooperative. Speech normal rate and rhythm. Affect is appropriate. Insight and judgement are appropriate. Attention is focused, linear, and appropriate.  NEURO: Oriented as arrived to appointment on time with no prompting.   Lab Results  Component Value Date   CREATININE 0.85 06/30/2021   BUN 15 06/30/2021   NA 142 06/30/2021   K 4.4 06/30/2021   CL 103 06/30/2021   CO2 23 06/30/2021   Lab Results  Component Value Date   ALT 30 06/26/2021   AST 26 06/26/2021   ALKPHOS 72 06/26/2021   BILITOT 0.4 06/26/2021   Lab Results  Component Value Date   HGBA1C 5.7 (H) 05/22/2021   HGBA1C 5.7 (H) 02/28/2020   HGBA1C 5.7 (H) 10/16/2019   HGBA1C 5.7 (H) 06/19/2014   Lab Results  Component Value Date   INSULIN 15.5 05/22/2021   INSULIN 10.5 02/28/2020   INSULIN 11.1 10/16/2019   Lab Results  Component Value Date   TSH 1.020 10/16/2019   Lab Results  Component Value Date   CHOL 128 06/30/2021   HDL 83 06/30/2021   LDLCALC 33 06/30/2021   TRIG 54 06/30/2021   CHOLHDL 1.5 06/30/2021   Lab Results  Component Value Date   WBC 5.6 06/26/2021   HGB 12.5 06/26/2021   HCT 38.8 06/26/2021   MCV 90.9 06/26/2021   PLT 294 06/26/2021   No results found for: "IRON", "TIBC", "FERRITIN" Lab Results  Component Value Date   VD25OH 32.7 02/28/2020   VD25OH 28.1 (L) 10/16/2019    Attestation Statements:   Reviewed by clinician on day of visit: allergies, medications, problem list, medical history, surgical history, family history, social history, and previous encounter notes.

## 2021-11-19 ENCOUNTER — Other Ambulatory Visit (INDEPENDENT_AMBULATORY_CARE_PROVIDER_SITE_OTHER): Payer: Self-pay | Admitting: Family Medicine

## 2021-11-19 ENCOUNTER — Telehealth (INDEPENDENT_AMBULATORY_CARE_PROVIDER_SITE_OTHER): Payer: Commercial Managed Care - HMO | Admitting: Family Medicine

## 2021-11-19 ENCOUNTER — Encounter (INDEPENDENT_AMBULATORY_CARE_PROVIDER_SITE_OTHER): Payer: Self-pay | Admitting: Family Medicine

## 2021-11-19 DIAGNOSIS — Z6832 Body mass index (BMI) 32.0-32.9, adult: Secondary | ICD-10-CM | POA: Diagnosis not present

## 2021-11-19 DIAGNOSIS — R7303 Prediabetes: Secondary | ICD-10-CM

## 2021-11-19 DIAGNOSIS — R632 Polyphagia: Secondary | ICD-10-CM

## 2021-11-19 DIAGNOSIS — E669 Obesity, unspecified: Secondary | ICD-10-CM

## 2021-11-19 MED ORDER — PHENTERMINE HCL 37.5 MG PO TABS: 37.5000 mg | ORAL_TABLET | Freq: Every day | ORAL | 0 refills | Status: DC

## 2021-11-19 MED ORDER — SEMAGLUTIDE (1 MG/DOSE) 4 MG/3ML ~~LOC~~ SOPN: 1.0000 mg | PEN_INJECTOR | SUBCUTANEOUS | 0 refills | Status: DC

## 2021-11-20 NOTE — Telephone Encounter (Signed)
Prior authorization was already started for Ozempic. Waiting on response from insurance. Thanks!

## 2021-11-20 NOTE — Telephone Encounter (Signed)
Ok, send to [place]?

## 2021-11-24 ENCOUNTER — Encounter (INDEPENDENT_AMBULATORY_CARE_PROVIDER_SITE_OTHER): Payer: Self-pay

## 2021-11-24 ENCOUNTER — Telehealth (INDEPENDENT_AMBULATORY_CARE_PROVIDER_SITE_OTHER): Payer: Self-pay | Admitting: Family Medicine

## 2021-11-24 NOTE — Telephone Encounter (Signed)
Bethany [name] - Prior authorization denied for [employer]. Per insurance: patient does not have type 2 diabetes. Patient sent denial message via mychart.

## 2021-11-26 ENCOUNTER — Encounter: Payer: Self-pay | Admitting: Obstetrics & Gynecology

## 2021-11-26 ENCOUNTER — Other Ambulatory Visit (HOSPITAL_COMMUNITY)
Admission: RE | Admit: 2021-11-26 | Discharge: 2021-11-26 | Disposition: A | Discharge status: Home / Self Care | Payer: Commercial Managed Care - HMO | Source: Ambulatory Visit | Attending: Obstetrics & Gynecology | Admitting: Obstetrics & Gynecology

## 2021-11-26 ENCOUNTER — Ambulatory Visit (INDEPENDENT_AMBULATORY_CARE_PROVIDER_SITE_OTHER): Payer: Commercial Managed Care - HMO | Admitting: Obstetrics & Gynecology

## 2021-11-26 VITALS — BP 118/80 | HR 76

## 2021-11-26 DIAGNOSIS — N939 Abnormal uterine and vaginal bleeding, unspecified: Secondary | ICD-10-CM | POA: Diagnosis present

## 2021-11-26 DIAGNOSIS — N7689 Other specified inflammation of vagina and vulva: Secondary | ICD-10-CM | POA: Diagnosis not present

## 2021-11-26 NOTE — Progress Notes (Signed)
Bethany [name] 02/27/1961 [id number]        61 y.o.  [id number]   RP: Postmenopausal bleeding x 2 in the last 2 weeks s/p Total Hysterectomy  HPI: Postmenopausal bleeding x 2 in the last 2 weeks s/p Total Hysterectomy many years ago.  Last episode happened a week ago, no bleeding since then.  Abstinent, nothing was inserted in the vagina.  Last Pap Neg in 2019.  No h/o abnormal Pap.  Urine/BMs normal, certain that the bleeding was not in urine or around the anal area.  Postmenopause on no HRT.  H/O Left Breast Ca.     OB History  Gravida Para Term Preterm AB Living  '4 3 3   1 3  '$ SAB IAB Ectopic Multiple Live Births  1       3    # Outcome Date GA Lbr Len/2nd Weight Sex Delivery Anes PTL Lv  4 SAB           3 Term           2 Term           1 Term             Past medical history,surgical history, problem list, medications, allergies, family history and social history were all reviewed and documented in the EPIC chart.   Directed ROS with pertinent positives and negatives documented in the history of present illness/assessment and plan.  Exam:  Vitals:   11/26/21 1158  BP: 118/80  Pulse: 76  SpO2: 98%   General appearance:  Normal  Gynecologic exam: Vulva normal.  Urethra normal.  Speculum:  No current bleeding, normal minimal vaginal secretions.  Vaginal vault with 2 very small granulomas.  Pap test done.  Silver Nitrate on the 2 granulomas.  No other vaginal lesions.  Speculum removed.    Patient felt dizzy and passed out right after the Gyn exam.  She had time to lay down on the floor without hurting herself.  RCR and good breathing with normal A/E bilaterally.  I called for help and Estill Bamberg and Caryl Asp came in the room.  Her legs were raised.  She responded to verbal stimulus and pain stimulus by rubbing her sternum.  We then gave her a caloric drink.  She had no breakfast this morning.  Patient was well oriented.  She reported "passing out" in the past.  No cardiac disease,  on Trulicity, no pulmonary problems.  Probably a Vagal reaction to pain from the Silver Nitrate treatment combined to dehydration and low blood sugar from fasting.  Recommend to take it easy, hydrate and eat promptly.   Assessment/Plan:  61 y.o. [id number]   1. Abnormal vaginal bleeding Postmenopausal bleeding x 2 in the last 2 weeks s/p Total Hysterectomy many years ago.  Last episode happened a week ago, no bleeding since then.  Abstinent, nothing was inserted in the vagina.  Last Pap Neg in 2019.  No h/o abnormal Pap.  Urine/BMs normal, certain that the bleeding was not in urine or around the anal area.  Postmenopause on no HRT.  H/O Left Breast Ca.  Pap reflex done at the Vaginal Cuff.  Small granulomas at vaginal cuff.  Silver Nitrate applied on both.  No other lesion.  Management per results. - Cytology - PAP( [place])  Other orders - TRULICITY 4.5 JJ/9.4RD SOPN; Inject into the skin. - levothyroxine (SYNTHROID) 150 MCG tablet; Take 150 mcg by mouth every morning.   [name]  MD, 12:37 PM 11/26/2021

## 2021-12-01 ENCOUNTER — Telehealth: Payer: Self-pay | Admitting: Cardiovascular Disease

## 2021-12-01 NOTE — Telephone Encounter (Signed)
Pt c/o Syncope: STAT if syncope occurred within 30 minutes and pt complains of lightheadedness High Priority if episode of passing out, completely, today or in last 24 hours   Did you pass out today? No    When is the last time you passed out?  11/26/21  Has this occurred multiple times? Yes   Did you have any symptoms prior to passing out?   Lightheadedness & sweating  Patient passed out during breast biopsy last week. States she's been having dizziness when she gets up really fast or bends over. Is unsure if the cause is dehydration. Patient has been scheduled to see Melina Copa tomorrow 09/19 regarding this.

## 2021-12-01 NOTE — Progress Notes (Unsigned)
Cardiology Office Note    Date:  12/02/2021   ID:  Bethany [name], DOB 1961/01/08, MRN [id number]  PCP:  Leeroy Cha, MD  Cardiologist:  [name], MD  Electrophysiologist:  None   Chief Complaint: f/u dizziness, syncope  History of Present Illness:   Bethany [name] is a 61 y.o. female with history of CAD s/p DES to LAD 06/2019, breast cancer in 2019 s/p radiation therapy, hiatal hernia, thyroid cancer, hypothyroidism, HTN, vasovagal syncope, former tobacco abuse and anxiety who presents for follow-up.  She was seen as a new patient by Dr. Angelena Form for evaluation of chest pain 05/15/19. She had had EGD per Dr. Henrene Pastor 04/12/19 with no cause for chest pain found. She is known to have radiation induced lung fibrosis.  Echo 05/2019 showed normal LV systolic function, EF 29-47%, no valve disease, mild asymmetric LVH, mildly elevated PASP (no evidence of MVP previously mentioned in [place]). She underwent gated cardiac CTA 06/2019 and this showed a LAD stenosis that appeared to be flow limiting.  She previously had breast cancer in the left breast that was treated, with recurrence on the right around 2021. She has been undergoing radiation therapy with plans for lumpectomy. Due to cardiac abnormalities, this was deferred. She underwent LHC 06/29/19 showing 80% mLAD lesion, treated with PTCA/DES. It was recommended that she could continue 3 months of DAPT with ASA and Plavix and then stop for her breast surgery. She was able to go on to have her surgery. At [place] 10/2019 Dr. Angelena Form resumed Plavix along with continuation of ASA. Last OV 02/2021 indicated plan to continue ASA + statin but no longer on Plavix. At that visit she had had an episode of syncope a week prior following urology procedure requiring nitrous oxide and lidocaine - she'd woken up and saw a lot of blood then passed out; this was felt to be vasovagal by ED evaluation. Cardiac workup was reassuring. In follow-up Dr. Angelena Form  recommended echocardiogram but do not see performed. In the interim she's followed with pharmD team for lipid management/PCSK9i.  She returns for follow-up evaluation of recurrent syncope and near-syncope. She reports longstanding history vasovagal syncope for many years (i.e. stressful events or pain) and had another episode last week at [facility] office after a gyn procedure. She denies VS checked at that time, rested and slowly felt better. She usually gets a prodrome of feeling like she's going to pass out accompanied by sense of heart racing. She states on many of these prior occasions her BP has been checked and it's been really low. The other reproducible trigger for this is standing up or hot showers although she's usually able to sit back down and symptoms subside. No angina. She did start Trulicity before episodes began flaring up again so has been losing some weight - she plans to discuss with prescriber to back down on dose. Reports chronic dyspnea over the past 8 months or so but no dramatic changes recently.  Orthostatics: Lying 127/99 pulse 76 Sitting 123/83 pulse 78 Standing 8m109/74 pulse 89 Standing 319m25/86, pulse 88 No symptoms  Labwork independently reviewed: 06/2021 BMET wnl K 4.4, Cr 0.85, LDL 33, trig 54, CBC wnl, LFTs wnl 05/2021 A1C 5.7 2021 TSH ok  Cardiology Studies:   Studies reviewed are outlined and summarized above. Reports included below if pertinent.   LHC 06/29/19            Mid LAD lesion is 80% stenosed.  A drug-eluting stent was successfully placed using a STENT RESOLUTE ONYX 3.0X18.            Post intervention, there is a 0% residual stenosis.   1. Severe stenosis mid LAD 2. Successful PTCA/DES x 1 mid LAD   Recommendations: 3 months of DAPT with ASA and Plavix and then will stop for her breast surgery.    Echo 05/2019    1. Left ventricular ejection fraction, by estimation, is 60 to 65%. The  left ventricle has normal function. The  left ventricle has no regional  wall motion abnormalities. There is mild asymmetric left ventricular  hypertrophy. Left ventricular diastolic  parameters were normal.   2. Right ventricular systolic function is normal. The right ventricular  size is normal. There is mildly elevated pulmonary artery systolic  pressure.   3. The mitral valve is normal in structure. No evidence of mitral valve  regurgitation. No evidence of mitral stenosis.   4. The aortic valve is normal in structure. Aortic valve regurgitation is  not visualized. No aortic stenosis is present.     Past Medical History:  Diagnosis Date   ADD (attention deficit disorder)    Anxiety    B12 deficiency    Breast cancer ([place]) 2019   left breast cancer/diagnosed in 10/2017/taking radiation treatment until 03/15/18   Breast mass, left 11/2017   going thru radiation until 02/2018   CAD (coronary artery disease)    a. DES to LAD 06/2019.   Chest pain 06/19/2014   COPD (chronic obstructive pulmonary disease) (HCC)    beginning stages/small scar   Dental crowns present    Depression    Hiatal hernia    History of hiatal hernia    no current med.   History of thyroid cancer 12/15/2017   Hypertension    states under control with med., has been on med. x 1 yr.   Hypothyroidism    Malignant neoplasm of upper-outer quadrant of left breast in female, estrogen receptor positive ([place])    Mild hyperlipidemia    Mucocele of appendix 10/03/2015   MVP (mitral valve prolapse)    not seen on echo 05/2019   Personal history of radiation therapy 2019   Left Breast Cancer   PONV (postoperative nausea and vomiting)    Prediabetes    Radiation fibrosis of lung ([place])    Syncope and collapse 12/25/2008   Qualifier: Diagnosis of  By: Philemon Kingdom     Urinary incontinence    USI    Vitamin D deficiency     Past Surgical History:  Procedure Laterality Date   ABDOMINAL HYSTERECTOMY     partial   APPENDECTOMY     AXILLARY  SENTINEL NODE BIOPSY Left 12/09/2017   Procedure: AXILLARY SENTINEL NODE BIOPSY;  Surgeon: Erroll Luna, MD;  Location: [place];  Service: General;  Laterality: Left;   BREAST EXCISIONAL BIOPSY Right 09/20/2019   BREAST LUMPECTOMY Left 11/2017   BREAST LUMPECTOMY WITH NEEDLE LOCALIZATION Left 11/25/2017   Procedure: LEFT BREAST LUMPECTOMY WITH NEEDLE LOCALIZATION;  Surgeon: Erroll Luna, MD;  Location: [place];  Service: General;  Laterality: Left;   BREAST LUMPECTOMY WITH RADIOACTIVE SEED LOCALIZATION Right 09/20/2019   Procedure: RIGHT BREAST LUMPECTOMY WITH RADIOACTIVE SEED LOCALIZATION;  Surgeon: Erroll Luna, MD;  Location: [place];  Service: General;  Laterality: Right;   COLONOSCOPY WITH PROPOFOL  10/03/2015   CORONARY STENT INTERVENTION N/A 06/29/2019   Procedure: CORONARY STENT INTERVENTION;  Surgeon: Burnell Blanks, MD;  Location: [place] [facility];  Service: Cardiovascular;  Laterality: N/A;   LAPAROSCOPIC APPENDECTOMY N/A 10/03/2015   Procedure: APPENDECTOMY LAPAROSCOPIC;  Surgeon: Rolm Bookbinder, MD;  Location: [place];  Service: General;  Laterality: N/A;   LEFT HEART CATH AND CORONARY ANGIOGRAPHY N/A 06/29/2019   Procedure: LEFT HEART CATH AND CORONARY ANGIOGRAPHY;  Surgeon: Burnell Blanks, MD;  Location: [place] [facility];  Service: Cardiovascular;  Laterality: N/A;   TOTAL THYROIDECTOMY  04/02/2003    Current Medications: Current Meds  Medication Sig   albuterol (VENTOLIN HFA) 108 (90 Base) MCG/ACT inhaler Inhale 2 puffs into the lungs every 6 (six) hours as needed for wheezing or shortness of breath.   Cyanocobalamin (B-12 COMPLIANCE INJECTION IJ) Inject as directed. weekly   Evolocumab (REPATHA SURECLICK) 086 MG/ML SOAJ Inject 1 Pen into the skin every 14 (fourteen) days.   ibuprofen (ADVIL) 600 MG tablet Take 1 tablet (600 mg total) by mouth every 6 (six) hours as needed.   levothyroxine  (SYNTHROID) 137 MCG tablet Take 137 mcg by mouth every morning.   losartan-hydrochlorothiazide (HYZAAR) 100-25 MG tablet Take 1 tablet by mouth daily.   Multiple Vitamin (MULTIVITAMIN WITH MINERALS) TABS tablet Take 1 tablet by mouth daily.   nitroGLYCERIN (NITROSTAT) 0.4 MG SL tablet Place 1 tablet (0.4 mg total) under the tongue every 5 (five) minutes as needed for chest pain.   rosuvastatin (CRESTOR) 40 MG tablet TAKE 1 TABLET BY MOUTH DAILY. PT NEEDS TO KEEP UPCOMING APPT IN AUG FOR FURTHER REFILLS   TRULICITY 4.5 VH/8.4ON SOPN Inject into the skin once a week.   venlafaxine XR (EFFEXOR-XR) 150 MG 24 hr capsule TAKE 1 CAPSULE BY MOUTH EVERY DAY WITH BREAKFAST   Vitamin D, Ergocalciferol, (DRISDOL) 1.25 MG (50000 UNIT) CAPS capsule Take 1 capsule (50,000 Units total) by mouth every 7 (seven) days.      Allergies:   Oxycodone, Latex, Other, and Sulfa antibiotics   Social History   Socioeconomic History   Marital status: Married    Spouse name: Patrick Jupiter   Number of children: 3   Years of education: Not on file   Highest education level: Not on file  Occupational History   Occupation: stay at home spouse  Tobacco Use   Smoking status: Former    Types: Cigarettes    Quit date: 08/14/2014    Years since quitting: 7.3   Smokeless tobacco: Never  Vaping Use   Vaping Use: Never used  Substance and Sexual Activity   Alcohol use: Not Currently   Drug use: No   Sexual activity: Not Currently    Partners: Male    Birth control/protection: Surgical    Comment: hysterectomy  Other Topics Concern   Not on file  Social History Narrative   Not on file   Social Determinants of Health   Financial Resource Strain: Not on file  Food Insecurity: Not on file  Transportation Needs: No Transportation Needs (12/23/2017)   PRAPARE - Hydrologist (Medical): No    Lack of Transportation (Non-Medical): No  Physical Activity: Not on file  Stress: Not on file  Social  Connections: Not on file     Family History:  The patient's family history includes Alcoholism in her father and mother; Anxiety disorder in her father; Cancer in her father; Colon cancer in an other family member; Colon polyps in her mother; Depression in her father; Heart disease in her father and paternal  grandfather; High Cholesterol in her mother; Hypertension in her brother and father; Lung cancer in her paternal aunt; Non-Hodgkin's lymphoma (age of onset: 47) in her father; Throat cancer in her maternal grandfather and paternal grandmother. There is no history of Stomach cancer, Rectal cancer, or Esophageal cancer.  ROS:   Please see the history of present illness.  All other systems are reviewed and otherwise negative.    EKG(s)/Additional Labs   EKG:  EKG is ordered today, personally reviewed, demonstrating NSR 78bpm no acute changes, QTc wnl  Recent Labs: 06/26/2021: ALT 30; Hemoglobin 12.5; Platelet Count 294 06/30/2021: BUN 15; Creatinine, Ser 0.85; Potassium 4.4; Sodium 142  Recent Lipid Panel    Component Value Date/Time   CHOL 128 06/30/2021 0955   TRIG 54 06/30/2021 0955   HDL 83 06/30/2021 0955   CHOLHDL 1.5 06/30/2021 0955   CHOLHDL 3.7 06/19/2014 2234   VLDL 18 06/19/2014 2234   LDLCALC 33 06/30/2021 0955    PHYSICAL EXAM:    VS:  BP 123/83   Pulse 78   Ht '5\' 6"'$  (1.676 m)   Wt 199 lb (90.3 kg)   SpO2 96%   BMI 32.12 kg/m   BMI: Body mass index is 32.12 kg/m.  GEN: Well nourished, well developed female in no acute distress HEENT: normocephalic, atraumatic Neck: no JVD, carotid bruits, or masses Cardiac: RRR; no murmurs, rubs, or gallops, no edema  Respiratory:  clear to auscultation bilaterally, normal work of breathing GI: soft, nontender, nondistended, + BS MS: no deformity or atrophy Skin: warm and dry, no rash Neuro:  Alert and Oriented x 3, Strength and sensation are intact, follows commands Psych: euthymic mood, full affect  Wt Readings from  Last 3 Encounters:  12/02/21 199 lb (90.3 kg)  11/10/21 202 lb (91.6 kg)  09/30/21 204 lb (92.5 kg)     ASSESSMENT & PLAN:   1. Recurrent syncope - has long history of vasovagal syncope with recurrent episode last week in setting of pap smear. She is also describing some triggers of orthostasis as well. This may be exacerbated by weight loss in setting of Trulicity. BP does drop upon standing (was asymptomatic today) I will stop her losartan-HCT and switch to losartan '100mg'$  daily. Check labs today. I have also recommended we proceed with echocardiogram and 2 week event monitor - patient feels the prodrome is happening often enough to capture on 2 week monitor. Discussed [place] DMV recommendation of no driving given syncope - tricky in this situation as she's usually had known triggers to avoid in her lifetime rather than abrupt events. I advised no driving until we have her workup back at which time I will discuss with Dr. Angelena Form. She otherwise does not really have any seizure phenomena with this.  2. CAD - stable. No longer on antiplatelets, fell off list. I reviewed with Dr. Angelena Form pre-visit who recommended to restart ASA '81mg'$  daily (monotherapy). Discussed limiting ibuprofen given ASA use. Continue statin. Would not start BB presently with episodes of orthostasis-type dizziness. Can f/u monitor for review.  3. Hyperlipidemia - continue rosuvastatin.  Lipids 06/2021 reviewed, LDL at goal.  4. Essential HTN - advised patient to monitor home BPs off HCTZ and bring log to next visit. May need to tolerate slightly higher values to avoid #1. If she continues to lose weight we may need to adjust losartan further, but will review in follow-up.     Disposition: F/u with myself after testing. She also reports chronic DOE in setting  of her prior fibrosis. Advised she also f/u with Dr. Valeta Harms as well to discern whether she needs any repeat chest imaging.   Medication Adjustments/Labs and Tests  Ordered: Current medicines are reviewed at length with the patient today.  Concerns regarding medicines are outlined above. Medication changes, Labs and Tests ordered today are summarized above and listed in the Patient Instructions accessible in Encounters.   Signed, [name], PA-C  12/02/2021 2:46 PM    [place] Phone: [phone]; Fax: [phone]

## 2021-12-01 NOTE — Telephone Encounter (Signed)
Patient has a history of passing out. Patient stated she usually passes out a couple of times a year. Patient stated she has been passing out more the last couple of months, and has gotten worse over the last week. Patient thinks it could be dehydration, but she is always drinking fluids and will drink liquid IV to help. Patient stated she feels the passing out coming on and tries to get somewhere to sit but it happens so fast. Patient stated she feels diaphoretic, dizzy, and feels herself loosing control. Patient stated the only thing new in her medications is Trulicity that she started a couple of months ago and just had her dose increased last week. Encouraged patient to call the doctor who prescribed Trulicity and let them know how she is feeling. Will have patient keep her appointment tomorrow, so cardiac issues will be ruled out.

## 2021-12-02 ENCOUNTER — Encounter: Payer: Self-pay | Admitting: Physician Assistant

## 2021-12-02 ENCOUNTER — Ambulatory Visit: Payer: Commercial Managed Care - HMO | Attending: Physician Assistant | Admitting: Physician Assistant

## 2021-12-02 VITALS — BP 123/83 | HR 78 | Ht 66.0 in | Wt 199.0 lb

## 2021-12-02 DIAGNOSIS — R55 Syncope and collapse: Secondary | ICD-10-CM

## 2021-12-02 DIAGNOSIS — I10 Essential (primary) hypertension: Secondary | ICD-10-CM

## 2021-12-02 DIAGNOSIS — R42 Dizziness and giddiness: Secondary | ICD-10-CM

## 2021-12-02 DIAGNOSIS — I2510 Atherosclerotic heart disease of native coronary artery without angina pectoris: Secondary | ICD-10-CM | POA: Diagnosis not present

## 2021-12-02 DIAGNOSIS — E785 Hyperlipidemia, unspecified: Secondary | ICD-10-CM

## 2021-12-02 MED ORDER — LOSARTAN POTASSIUM 100 MG PO TABS: 100.0000 mg | ORAL_TABLET | Freq: Every day | ORAL | 2 refills | Status: DC

## 2021-12-02 NOTE — Patient Instructions (Signed)
Medication Instructions:  DISCONTINUE LOSARTAN/ HCTZ START Losartan $RemoveBefore'100mg'[id number]$  Take 1 tablet daily *If you need a refill on your cardiac medications before your next appointment, please call your pharmacy*   Lab Work: TODAY-BMET, MAG, CBC, TSH If you have labs (blood work) drawn today and your tests are completely normal, you will receive your results only by: [place] (if you have MyChart) OR A paper copy in the mail If you have any lab test that is abnormal or we need to change your treatment, we will call you to review the results.   Testing/Procedures: Your physician has requested that you have an echocardiogram. Echocardiography is a painless test that uses sound waves to create images of your heart. It provides your doctor with information about the size and shape of your heart and how well your heart's chambers and valves are working. This procedure takes approximately one hour. There are no restrictions for this procedure.  ZIO AT Long term monitor-Live Telemetry  Your physician has requested you wear a ZIO patch monitor for 14 days.  This is a single patch monitor. [employer] supplies one patch monitor per enrollment. Additional  stickers are not available.  Please do not apply patch if you will be having a Nuclear Stress Test, Echocardiogram, Cardiac CT, MRI,  or Chest Xray during the period you would be wearing the monitor. The patch cannot be worn during  these tests. You cannot remove and re-apply the ZIO AT patch monitor.  Your ZIO patch monitor will be mailed 3 day USPS to your address on file. It may take 3-5 days to  receive your monitor after you have been enrolled.  Once you have received your monitor, please review the enclosed instructions. Your monitor has  already been registered assigning a specific monitor serial # to you.   Billing and Patient Assistance Program information  Bethany [name] has been supplied with any insurance information on record for  billing. [employer] offers a sliding scale Patient Assistance Program for patients without insurance, or whose  insurance does not completely cover the cost of the ZIO patch monitor. You must apply for the  Patient Assistance Program to qualify for the discounted rate. To apply, call [employer] at [phone],  select option 4, select option 2 , ask to apply for the Patient Assistance Program, (you can request an  interpreter if needed). [employer] will ask your household income and how many people are in your  household. [employer] will quote your out-of-pocket cost based on this information. They will also be able  to set up a 12 month interest free payment plan if needed.  Applying the monitor   Shave hair from upper left chest.  Hold the abrader disc by orange tab. Rub the abrader in 40 strokes over left upper chest as indicated in  your monitor instructions.  Clean area with 4 enclosed alcohol pads. Use all pads to ensure the area is cleaned thoroughly. Let  dry.  Apply patch as indicated in monitor instructions. Patch will be placed under collarbone on left side of  chest with arrow pointing upward.  Rub patch adhesive wings for 2 minutes. Remove the white label marked "1". Remove the white label  marked "2". Rub patch adhesive wings for 2 additional minutes.  While looking in a mirror, press and release button in center of patch. A small green light will flash 3-4  times. This will be your only indicator that the monitor has been turned on.  Do not shower for the  first 24 hours. You may shower after the first 24 hours.  Press the button if you feel a symptom. You will hear a small click. Record Date, Time and Symptom in  the Patient Log.   Starting the Gateway  In your kit there is a Hydrographic surveyor box the size of a cellphone. This is Airline pilot. It transmits all your  recorded data to [facility]. This box must always stay within 10 feet of you. Open the box and push the *  button. There will  be a light that blinks orange and then green a few times. When the light stops  blinking, the Gateway is connected to the ZIO patch. Call [employer] at [phone] to confirm your monitor is transmitting.  Returning your monitor  Remove your patch and place it inside the [place]. In the lower half of the Gateway there is a white  bag with prepaid postage on it. Place Gateway in bag and seal. Mail package back to [place] as soon as  possible. Your physician should have your final report approximately 7 days after you have mailed back  your monitor. Call [place] at [phone] if you have questions regarding your ZIO AT  patch monitor. Call them immediately if you see an orange light blinking on your monitor.  If your monitor falls off in less than 4 days, contact our Monitor department at [phone]. If your  monitor becomes loose or falls off after 4 days call [employer] at [phone] for suggestions on  securing your monitor.    Follow-Up: At [facility], you and your health needs are our priority.  As part of our continuing mission to provide you with exceptional heart care, we have created designated Provider Care Teams.  These Care Teams include your primary Cardiologist (physician) and Advanced Practice Providers (APPs -  Physician Assistants and Nurse Practitioners) who all work together to provide you with the care you need, when you need it.  We recommend signing up for the patient portal called "MyChart".  Sign up information is provided on this After Visit Summary.  MyChart is used to connect with patients for Virtual Visits (Telemedicine).  Patients are able to view lab/test results, encounter notes, upcoming appointments, etc.  Non-urgent messages can be sent to your provider as well.   To learn more about what you can do with MyChart, go to [email or web address].    Your next appointment:   FOLLOW UP AFTER TESTING      The format for  your next appointment:   In Person  Provider:   Melina Copa, PA-C       Other Instructions We recommend you wearing compression hose and you can order them off [place]

## 2021-12-03 ENCOUNTER — Other Ambulatory Visit (INDEPENDENT_AMBULATORY_CARE_PROVIDER_SITE_OTHER): Payer: Commercial Managed Care - HMO

## 2021-12-03 ENCOUNTER — Telehealth: Payer: Self-pay

## 2021-12-03 ENCOUNTER — Other Ambulatory Visit (HOSPITAL_COMMUNITY): Payer: Commercial Managed Care - HMO

## 2021-12-03 DIAGNOSIS — E785 Hyperlipidemia, unspecified: Secondary | ICD-10-CM

## 2021-12-03 DIAGNOSIS — R55 Syncope and collapse: Secondary | ICD-10-CM

## 2021-12-03 DIAGNOSIS — I10 Essential (primary) hypertension: Secondary | ICD-10-CM

## 2021-12-03 DIAGNOSIS — I2510 Atherosclerotic heart disease of native coronary artery without angina pectoris: Secondary | ICD-10-CM

## 2021-12-03 LAB — CYTOLOGY - PAP: Diagnosis: NEGATIVE

## 2021-12-03 LAB — BASIC METABOLIC PANEL
BUN/Creatinine Ratio: 19 (ref 9–23)
BUN: 15 mg/dL (ref 6–24)
CO2: 22 mmol/L (ref 20–29)
Calcium: 9.9 mg/dL (ref 8.7–10.2)
Chloride: 101 mmol/L (ref 96–106)
Creatinine, Ser: 0.78 mg/dL (ref 0.57–1.00)
Glucose: 96 mg/dL (ref 70–99)
Potassium: 4.1 mmol/L (ref 3.5–5.2)
Sodium: 144 mmol/L (ref 134–144)
eGFR: 87 mL/min/{1.73_m2} (ref >59)

## 2021-12-03 LAB — CBC
Hematocrit: 43.1 % (ref 34.0–46.6)
Hemoglobin: 14.3 g/dL (ref 11.1–15.9)
MCH: 29 pg (ref 26.6–33.0)
MCHC: 33.2 g/dL (ref 31.5–35.7)
MCV: 87 fL (ref 79–97)
Platelets: 349 10*3/uL (ref 150–450)
RBC: 4.93 x10E6/uL (ref 3.77–5.28)
RDW: 13 % (ref 11.7–15.4)
WBC: 7.6 10*3/uL (ref 3.4–10.8)

## 2021-12-03 LAB — TSH: TSH: 0.01 u[IU]/mL — ABNORMAL LOW (ref 0.450–4.500)

## 2021-12-03 LAB — MAGNESIUM: Magnesium: 2.4 mg/dL — ABNORMAL HIGH (ref 1.6–2.3)

## 2021-12-03 NOTE — Telephone Encounter (Signed)
My Chart message sent back to patient informing her.

## 2021-12-03 NOTE — Telephone Encounter (Signed)
OV 11/26/21 "Gynecologic exam: Vulva normal.  Urethra normal.  Speculum:  No current bleeding, normal minimal vaginal secretions.  Vaginal vault with 2 very small granulomas.  Pap test done.  Silver Nitrate on the 2 granulomas.  No other vaginal lesions.  Speculum removed.  "  Patient sent My Chart message with her appt request today. "Could you ask the Doctor how long I will have gray discharge? Thank you "

## 2021-12-03 NOTE — Telephone Encounter (Signed)
[name], MD  You 2 minutes ago (9:35 AM)    Likely 2 weeks, but no worries up to a month.

## 2021-12-03 NOTE — Progress Notes (Unsigned)
Enrolled for [employer] to mail a ZIO AT Live Telemetry monitor to patients address on file.   Dr. Angelena Form to read.

## 2021-12-05 ENCOUNTER — Encounter: Payer: Self-pay | Admitting: Obstetrics & Gynecology

## 2021-12-06 ENCOUNTER — Other Ambulatory Visit: Payer: Self-pay | Admitting: Obstetrics & Gynecology

## 2021-12-06 MED ORDER — NYSTATIN-TRIAMCINOLONE 100000-0.1 UNIT/GM-% EX OINT: 1.0000 | TOPICAL_OINTMENT | Freq: Two times a day (BID) | CUTANEOUS | 1 refills | Status: DC

## 2021-12-08 ENCOUNTER — Encounter (INDEPENDENT_AMBULATORY_CARE_PROVIDER_SITE_OTHER): Payer: Self-pay | Admitting: Family Medicine

## 2021-12-08 DIAGNOSIS — R7303 Prediabetes: Secondary | ICD-10-CM

## 2021-12-10 ENCOUNTER — Other Ambulatory Visit (INDEPENDENT_AMBULATORY_CARE_PROVIDER_SITE_OTHER): Payer: Self-pay | Admitting: Family Medicine

## 2021-12-10 DIAGNOSIS — R7303 Prediabetes: Secondary | ICD-10-CM

## 2021-12-10 MED ORDER — TIRZEPATIDE 2.5 MG/0.5ML ~~LOC~~ SOAJ: 2.5000 mg | SUBCUTANEOUS | 0 refills | Status: DC

## 2021-12-11 ENCOUNTER — Ambulatory Visit: Payer: Commercial Managed Care - HMO

## 2021-12-11 ENCOUNTER — Encounter (INDEPENDENT_AMBULATORY_CARE_PROVIDER_SITE_OTHER): Payer: Self-pay

## 2021-12-11 ENCOUNTER — Telehealth (INDEPENDENT_AMBULATORY_CARE_PROVIDER_SITE_OTHER): Payer: Self-pay | Admitting: Family Medicine

## 2021-12-11 NOTE — Telephone Encounter (Signed)
Bethany [name] - Prior authorization denied for [employer]. Per insurance: Patient does not have type 2 diabetes. Patient sent denial message.

## 2021-12-12 ENCOUNTER — Ambulatory Visit (HOSPITAL_COMMUNITY): Payer: Commercial Managed Care - HMO | Attending: Physician Assistant

## 2021-12-12 DIAGNOSIS — R55 Syncope and collapse: Secondary | ICD-10-CM | POA: Insufficient documentation

## 2021-12-12 DIAGNOSIS — I2510 Atherosclerotic heart disease of native coronary artery without angina pectoris: Secondary | ICD-10-CM | POA: Diagnosis present

## 2021-12-12 DIAGNOSIS — E785 Hyperlipidemia, unspecified: Secondary | ICD-10-CM | POA: Diagnosis present

## 2021-12-12 DIAGNOSIS — I10 Essential (primary) hypertension: Secondary | ICD-10-CM | POA: Diagnosis present

## 2021-12-12 DIAGNOSIS — I080 Rheumatic disorders of both mitral and aortic valves: Secondary | ICD-10-CM

## 2021-12-15 DIAGNOSIS — I10 Essential (primary) hypertension: Secondary | ICD-10-CM | POA: Diagnosis not present

## 2021-12-15 DIAGNOSIS — E785 Hyperlipidemia, unspecified: Secondary | ICD-10-CM | POA: Diagnosis not present

## 2021-12-15 DIAGNOSIS — I2510 Atherosclerotic heart disease of native coronary artery without angina pectoris: Secondary | ICD-10-CM

## 2021-12-15 DIAGNOSIS — R55 Syncope and collapse: Secondary | ICD-10-CM

## 2021-12-15 LAB — ECHOCARDIOGRAM COMPLETE
Area-P 1/2: 3.39 cm2
S' Lateral: 1.8 cm

## 2021-12-16 ENCOUNTER — Ambulatory Visit: Payer: Self-pay | Admitting: Bariatrics

## 2021-12-16 ENCOUNTER — Encounter: Payer: Self-pay | Admitting: Physician Assistant

## 2021-12-16 NOTE — Progress Notes (Deleted)
Left message for patient to call back  

## 2021-12-16 NOTE — Telephone Encounter (Signed)
Error

## 2022-01-05 ENCOUNTER — Ambulatory Visit
Admission: RE | Admit: 2022-01-05 | Discharge: 2022-01-05 | Disposition: A | Discharge status: Home / Self Care | Payer: Commercial Managed Care - HMO | Source: Ambulatory Visit | Attending: Hematology and Oncology | Admitting: Hematology and Oncology

## 2022-01-05 DIAGNOSIS — C50412 Malignant neoplasm of upper-outer quadrant of left female breast: Secondary | ICD-10-CM

## 2022-01-06 NOTE — Progress Notes (Unsigned)
TeleHealth Visit:  This visit was completed with telemedicine (audio/video) technology. Bethany [name] has verbally consented to this TeleHealth visit. The patient is located at home, the provider is located at home. The participants in this visit include the listed provider and patient. The visit was conducted today via MyChart video.  OBESITY Bethany [name] is here to discuss her progress with her obesity treatment plan along with follow-up of her obesity related diagnoses.   Today's visit was # 12 Starting weight: 203 lbs Starting date: 05/22/2021 Weight at last in office visit: 202 lbs on 11/10/21 Total weight loss: 1 lbs at last in office visit on 11/10/21. Weight reported last virtual; visit on 11/19/21: 201 lbs Today's reported weight: *** lbs No weight reported. Weight goal is 170 pounds.  Nutrition Plan: following a lower carbohydrate, vegetable and lean protein rich diet plan.   Current exercise: {exercise types:16438} walking some  Interim History: ***  Assessment/Plan:  1. ***  2. ***  3. ***  Obesity: Current BMI *** Bethany [name] {CHL AMB IS/IS NOT:210130109} currently in the action stage of change. As such, her goal is to {MWMwtloss#1:210800005}.  She has agreed to {MWMwtlossportion/plan2:23431}.   Exercise goals: {MWM EXERCISE RECS:23473}  Behavioral modification strategies: {MWMwtlossdietstrategies3:23432}.  Cedric has agreed to follow-up with our clinic in {NUMBER 1-10:22536} weeks.   No orders of the defined types were placed in this encounter.   There are no discontinued medications.   No orders of the defined types were placed in this encounter.     Objective:   VITALS: Per patient if applicable, see vitals. GENERAL: Alert and in no acute distress. CARDIOPULMONARY: No increased WOB. Speaking in clear sentences.  PSYCH: Pleasant and cooperative. Speech normal rate and rhythm. Affect is appropriate. Insight and judgement are appropriate. Attention is focused,  linear, and appropriate.  NEURO: Oriented as arrived to appointment on time with no prompting.   Lab Results  Component Value Date   CREATININE 0.78 12/02/2021   BUN 15 12/02/2021   NA 144 12/02/2021   K 4.1 12/02/2021   CL 101 12/02/2021   CO2 22 12/02/2021   Lab Results  Component Value Date   ALT 30 06/26/2021   AST 26 06/26/2021   ALKPHOS 72 06/26/2021   BILITOT 0.4 06/26/2021   Lab Results  Component Value Date   HGBA1C 5.7 (H) 05/22/2021   HGBA1C 5.7 (H) 02/28/2020   HGBA1C 5.7 (H) 10/16/2019   HGBA1C 5.7 (H) 06/19/2014   Lab Results  Component Value Date   INSULIN 15.5 05/22/2021   INSULIN 10.5 02/28/2020   INSULIN 11.1 10/16/2019   Lab Results  Component Value Date   TSH 0.010 (L) 12/02/2021   Lab Results  Component Value Date   CHOL 128 06/30/2021   HDL 83 06/30/2021   LDLCALC 33 06/30/2021   TRIG 54 06/30/2021   CHOLHDL 1.5 06/30/2021   Lab Results  Component Value Date   WBC 7.6 12/02/2021   HGB 14.3 12/02/2021   HCT 43.1 12/02/2021   MCV 87 12/02/2021   PLT 349 12/02/2021   No results found for: "IRON", "TIBC", "FERRITIN" Lab Results  Component Value Date   VD25OH 32.7 02/28/2020   VD25OH 28.1 (L) 10/16/2019    Attestation Statements:   Reviewed by clinician on day of visit: allergies, medications, problem list, medical history, surgical history, family history, social history, and previous encounter notes.  ***(delete if time-based billing not used) Time spent on visit including the items listed below was *** minutes.  -preparing to  see the patient (e.g., review of tests, history, previous notes) -obtaining and/or reviewing separately obtained history -counseling and educating the patient/family/caregiver -documenting clinical information in the electronic or other health record -ordering medications, tests, or procedures -independently interpreting results and communicating results to the patient/ family/caregiver -referring and  communicating with other health care professionals  -care coordination

## 2022-01-07 ENCOUNTER — Telehealth (INDEPENDENT_AMBULATORY_CARE_PROVIDER_SITE_OTHER): Payer: Commercial Managed Care - HMO | Admitting: Family Medicine

## 2022-01-07 ENCOUNTER — Encounter (INDEPENDENT_AMBULATORY_CARE_PROVIDER_SITE_OTHER): Payer: Self-pay | Admitting: Family Medicine

## 2022-01-07 ENCOUNTER — Other Ambulatory Visit: Payer: Self-pay | Admitting: Hematology and Oncology

## 2022-01-07 DIAGNOSIS — E669 Obesity, unspecified: Secondary | ICD-10-CM

## 2022-01-07 DIAGNOSIS — R7303 Prediabetes: Secondary | ICD-10-CM

## 2022-01-07 DIAGNOSIS — N6489 Other specified disorders of breast: Secondary | ICD-10-CM

## 2022-01-07 DIAGNOSIS — E559 Vitamin D deficiency, unspecified: Secondary | ICD-10-CM

## 2022-01-07 DIAGNOSIS — R632 Polyphagia: Secondary | ICD-10-CM | POA: Diagnosis not present

## 2022-01-07 DIAGNOSIS — Z6832 Body mass index (BMI) 32.0-32.9, adult: Secondary | ICD-10-CM

## 2022-01-07 MED ORDER — VITAMIN D (ERGOCALCIFEROL) 1.25 MG (50000 UNIT) PO CAPS: 50000.0000 [IU] | ORAL_CAPSULE | ORAL | 0 refills | Status: AC

## 2022-01-07 MED ORDER — TRULICITY 1.5 MG/0.5ML ~~LOC~~ SOAJ: 1.5000 mg | SUBCUTANEOUS | 0 refills | Status: DC

## 2022-01-07 MED ORDER — PHENTERMINE HCL 37.5 MG PO TABS: 37.5000 mg | ORAL_TABLET | Freq: Every day | ORAL | 0 refills | Status: DC

## 2022-01-09 ENCOUNTER — Telehealth: Payer: Self-pay | Admitting: Cardiovascular Disease

## 2022-01-09 MED ORDER — METOPROLOL SUCCINATE ER 25 MG PO TB24: 25.0000 mg | ORAL_TABLET | Freq: Every day | ORAL | 3 refills | Status: DC

## 2022-01-09 NOTE — Telephone Encounter (Signed)
Bethany Pitter, PA-C  01/08/2022  1:09 PM EDT     Sending my result to [place] since Dr. Angelena Form had routed fyi to both of [place]. Please let pt know monitor overall reassuring. No prolonged rhythm issues that would cause passing out. There were a few brief episodes of fast HR from top and bottom of heart. I would trial starting Toprol XL '25mg'$  once daily. This shouldn't really lower her BP the way that HCTZ was but let us know if any problems with this. In a previous result note she said she was feeling better - please check in to make sure that's still the case. Otherwise recommend she followup with me as previously recommended. Thank you!    The patient has been notified of the result and verbalized understanding.  All questions (if any) were answered. Bethany Amass, RN 01/09/2022 3:52 PM  Prescription has been sent in.  Patient that she continues to feel better.

## 2022-01-09 NOTE — Telephone Encounter (Signed)
  Patient sent a message through MyChart scheduling pool asking about her monitor results.

## 2022-01-14 ENCOUNTER — Ambulatory Visit: Payer: Commercial Managed Care - HMO | Admitting: Physician Assistant

## 2022-01-23 ENCOUNTER — Inpatient Hospital Stay: Payer: Commercial Managed Care - HMO | Admitting: Hematology and Oncology

## 2022-01-26 ENCOUNTER — Ambulatory Visit
Admission: RE | Admit: 2022-01-26 | Discharge: 2022-01-26 | Disposition: A | Discharge status: Home / Self Care | Payer: Commercial Managed Care - HMO | Source: Ambulatory Visit | Attending: Hematology and Oncology | Admitting: Hematology and Oncology

## 2022-01-26 ENCOUNTER — Other Ambulatory Visit: Payer: Self-pay | Admitting: Hematology and Oncology

## 2022-01-26 DIAGNOSIS — N6489 Other specified disorders of breast: Secondary | ICD-10-CM

## 2022-01-27 ENCOUNTER — Ambulatory Visit: Payer: Commercial Managed Care - HMO | Admitting: Physician Assistant

## 2022-01-28 ENCOUNTER — Ambulatory Visit: Payer: Commercial Managed Care - HMO | Admitting: Bariatrics

## 2022-01-29 ENCOUNTER — Other Ambulatory Visit: Payer: Commercial Managed Care - HMO

## 2022-01-30 ENCOUNTER — Encounter: Payer: Self-pay | Admitting: Hematology and Oncology

## 2022-01-30 ENCOUNTER — Inpatient Hospital Stay: Payer: Commercial Managed Care - HMO | Attending: Hematology and Oncology | Admitting: Hematology and Oncology

## 2022-01-30 VITALS — BP 147/98 | HR 72 | Temp 98.1°F | Resp 18 | Ht 66.0 in | Wt 201.6 lb

## 2022-01-30 DIAGNOSIS — Z85850 Personal history of malignant neoplasm of thyroid: Secondary | ICD-10-CM | POA: Diagnosis not present

## 2022-01-30 DIAGNOSIS — Z7985 Long-term (current) use of injectable non-insulin antidiabetic drugs: Secondary | ICD-10-CM | POA: Diagnosis not present

## 2022-01-30 DIAGNOSIS — C50412 Malignant neoplasm of upper-outer quadrant of left female breast: Secondary | ICD-10-CM | POA: Diagnosis not present

## 2022-01-30 DIAGNOSIS — Z170 Estrogen receptor positive status [ER+]: Secondary | ICD-10-CM | POA: Diagnosis not present

## 2022-01-30 DIAGNOSIS — M549 Dorsalgia, unspecified: Secondary | ICD-10-CM | POA: Insufficient documentation

## 2022-01-30 DIAGNOSIS — Z79890 Hormone replacement therapy (postmenopausal): Secondary | ICD-10-CM | POA: Diagnosis not present

## 2022-01-30 DIAGNOSIS — Z79899 Other long term (current) drug therapy: Secondary | ICD-10-CM | POA: Insufficient documentation

## 2022-01-30 DIAGNOSIS — E039 Hypothyroidism, unspecified: Secondary | ICD-10-CM | POA: Diagnosis not present

## 2022-01-30 DIAGNOSIS — Z87891 Personal history of nicotine dependence: Secondary | ICD-10-CM | POA: Diagnosis not present

## 2022-01-30 DIAGNOSIS — R55 Syncope and collapse: Secondary | ICD-10-CM | POA: Insufficient documentation

## 2022-01-30 DIAGNOSIS — I10 Essential (primary) hypertension: Secondary | ICD-10-CM | POA: Insufficient documentation

## 2022-01-30 DIAGNOSIS — I341 Nonrheumatic mitral (valve) prolapse: Secondary | ICD-10-CM | POA: Diagnosis not present

## 2022-01-30 DIAGNOSIS — Z79811 Long term (current) use of aromatase inhibitors: Secondary | ICD-10-CM | POA: Diagnosis not present

## 2022-01-30 DIAGNOSIS — J449 Chronic obstructive pulmonary disease, unspecified: Secondary | ICD-10-CM | POA: Insufficient documentation

## 2022-01-30 DIAGNOSIS — I2510 Atherosclerotic heart disease of native coronary artery without angina pectoris: Secondary | ICD-10-CM | POA: Diagnosis not present

## 2022-01-30 DIAGNOSIS — Z923 Personal history of irradiation: Secondary | ICD-10-CM | POA: Diagnosis not present

## 2022-01-30 NOTE — Progress Notes (Signed)
[place]  [phone] [phone]     ID: Bethany [name] DOB: 11-29-1960  MR#: [id number]  PTW#:[id number]  Patient Care Team: Leeroy Cha, MD as PCP - General (Internal Medicine) Burnell Blanks, MD as PCP - Cardiology (Cardiology) Erroll Luna, MD as Consulting Physician (General Surgery) Magrinat, Virgie Dad, MD (Inactive) as Consulting Physician (Oncology) Kyung Rudd, MD as Consulting Physician (Radiation Oncology) Irene Shipper, MD as Consulting Physician (Gastroenterology) [name], MD as Consulting Physician (Obstetrics and Gynecology) Delrae Rend, MD as Consulting Physician (Endocrinology) Mauro Kaufmann, RN as Oncology Nurse Navigator Rockwell Germany, RN as Oncology Nurse Navigator Icard, Octavio Graves, DO as Consulting Physician (Pulmonary Disease) Mcarthur Rossetti, MD as Consulting Physician (Orthopedic Surgery) Irene Limbo, MD as Consulting Physician (Plastic Surgery)  CHIEF COMPLAINT: Estrogen receptor positive breast cancer  CURRENT TREATMENT: Observation  INTERVAL HISTORY:  Bethany [name] returns today for follow-up of her estrogen receptor positive breast cancer. She is now under observation due to her inability to tolerate antiestrogens. She said she is scared to wait for 3 months, will be talking to Dr Brantley Stage soon. She otherwise reports some achiness in joints and some back pain and wonders if this can be related. Mood is ok although she feels very anxious overall. Rest of the pertinent 10 point ROS reviewed and neg.   COVID 19 VACCINATION STATUS: Status post Pfizer x2 as of November 2022  HISTORY OF CURRENT ILLNESS: The original intake note:  "Bethany [name]" had routine screening mammography on 10/26/2017 showing a possible abnormality in the left breast. She underwent unilateral left diagnostic mammography with tomography and left breast ultrasonography at [facility] on 10/28/2017  showing: breast density category B. There was a hypoechoic lesion consistent of a mass at the 2:30 o'clock upper outer quadrant middle depth and measuring 0.5 x 0.3 x 0.4 cm. Sonographic evaluation of the left axilla shows no enlarged or abnormal lymph nodes.  An attempt was made to obtain a biopsy of this lesion on 10/15/2017, however the patient had repeated episodes of syncope during the attempted procedure.  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 Margins were negative for carcinoma. Prognostic indicators significant for: estrogen receptor, 90% positive and progesterone receptor, 80% positive, both with strong staining intensity. Proliferation marker Ki67 at 3%. HER2 negative with an immunohistochemistry of (1+).  Then in a separate procedure she underwent left sentinel lymph node sampling on 12/09/2017 showing ([id number]): Four left axillary sentinel lymph were negative for carcinoma. (0/4).   Her subsequent history is as detailed below.   PAST MEDICAL HISTORY: Past Medical History:  Diagnosis Date   ADD (attention deficit disorder)    Anxiety    B12 deficiency    Breast cancer ([place]) 2019   left breast cancer/diagnosed in 10/2017/taking radiation treatment until 03/15/18   Breast mass, left 11/2017   going thru radiation until 02/2018   CAD (coronary artery disease)    a. DES to LAD 06/2019.   Chest pain 06/19/2014   COPD (chronic obstructive pulmonary disease) (HCC)    beginning stages/small scar   Dental crowns present    Depression    Hiatal hernia    History of hiatal hernia    no current med.   History of thyroid cancer 12/15/2017   Hypertension    states under control with med., has been on med. x 1 yr.   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[place])    Syncope and collapse 12/25/2008   Qualifier: Diagnosis of  By: Philemon Kingdom     Urinary incontinence    USI    Vasovagal syncope    Vitamin D deficiency   Thyroid cancer, GERD   PAST SURGICAL HISTORY: Past Surgical History:  Procedure Laterality Date   ABDOMINAL HYSTERECTOMY     partial   APPENDECTOMY     AXILLARY SENTINEL NODE BIOPSY Left 12/09/2017   Procedure: AXILLARY SENTINEL NODE BIOPSY;  Surgeon: Erroll Luna, MD;  Location: [place];  Service: General;  Laterality: Left;   BREAST EXCISIONAL BIOPSY Right 09/20/2019   BREAST LUMPECTOMY Left 11/2017   BREAST LUMPECTOMY WITH NEEDLE LOCALIZATION Left 11/25/2017   Procedure: LEFT BREAST LUMPECTOMY WITH NEEDLE LOCALIZATION;  Surgeon: Erroll Luna, MD;  Location: [place];  Service: General;  Laterality: Left;   BREAST LUMPECTOMY WITH RADIOACTIVE SEED LOCALIZATION Right 09/20/2019   Procedure: RIGHT BREAST LUMPECTOMY WITH RADIOACTIVE SEED LOCALIZATION;  Surgeon: Erroll Luna, MD;  Location: [place];  Service: General;  Laterality: Right;   COLONOSCOPY WITH PROPOFOL  10/03/2015   CORONARY STENT INTERVENTION N/A 06/29/2019   Procedure: CORONARY STENT INTERVENTION;  Surgeon: Burnell Blanks, MD;  Location: [place] [facility];  Service: Cardiovascular;  Laterality: N/A;   LAPAROSCOPIC APPENDECTOMY N/A 10/03/2015   Procedure: APPENDECTOMY LAPAROSCOPIC;  Surgeon: Rolm Bookbinder, MD;  Location: [place];  Service: General;  Laterality: N/A;   LEFT HEART CATH AND CORONARY ANGIOGRAPHY N/A 06/29/2019   Procedure: LEFT HEART CATH AND CORONARY ANGIOGRAPHY;  Surgeon: Burnell Blanks, MD;  Location: [place] [facility];  Service:  Cardiovascular;  Laterality: N/A;   TOTAL THYROIDECTOMY  04/02/2003  Hysterectomy without BSO   FAMILY HISTORY Family History  Problem Relation Age of Onset   Colon polyps Mother    High Cholesterol Mother    Alcoholism Mother    Hypertension Father    Heart disease Father    Non-Hodgkin's lymphoma Father 73   Cancer Father    Depression Father    Anxiety disorder Father    Alcoholism Father    Hypertension Brother    Lung cancer Paternal Aunt        d. 51   Throat cancer Maternal Grandfather        d. 60   Throat cancer Paternal Grandmother        dx < 63   Heart disease Paternal Grandfather    Colon cancer Other        great maternal aunt    Stomach cancer Neg Hx    Rectal cancer Neg Hx    Esophageal cancer Neg Hx   The patient's father died in 04/22/17 age age 12 due to non-Hodgkin's lymphoma. The patient's mother is alive at age 67 as of October 2019. The patient has 1 brother, no sisters. There was a paternal aunt with lung cancer. There was a paternal grandmother and maternal grandfather with throat cancer. She denies a family history of breast or ovarian cancer.    GYNECOLOGIC HISTORY:  No  LMP recorded. Patient has had a hysterectomy. Menarche: 61 years old Age at first live birth: 61 years old She is GX P3.  She is status post partial hysterectomy (without BSO) in her late 59's. She did not have HRT.    SOCIAL HISTORY: (updated 09/2018) Bethany [name] owns a jewelry business. Her husband, Bethany [name], is a Clinical cytogeneticist for [employer]. At home is her, her husband, and their two dogs. The patient's oldest son, Bethany [name], has 3 children and lives in [place] and works in Nurse, children's for [employer]. The patient's son, Bethany [name] lives in [place] with 1 daughter and works as a Teacher, music. The patient's youngest son, Bethany [name] lives in [place] and works for [employer] in [place]. The patient has 5 grandchildren total. Her grandchildren are ages 68, 7, 43, 58, and 3. She notes the  youngest has Down Syndrome and recently turned 3. She does not currently belong to a church, but she is [place].     ADVANCED DIRECTIVES: In the absence of any documentation to the contrary, the patient's spouse is their HCPOA.    HEALTH MAINTENANCE: Social History   Tobacco Use   Smoking status: Former    Types: Cigarettes    Quit date: 08/14/2014    Years since quitting: 7.4   Smokeless tobacco: Never  Vaping Use   Vaping Use: Never used  Substance Use Topics   Alcohol use: Not Currently   Drug use: No    Colonoscopy: 03/15/2018- to be repeated in 3 years under Dr. Henrene Pastor  PAP:   Bone density: 10/25/2017 showed osteopenia   Allergies  Allergen Reactions   Oxycodone Nausea And Vomiting   Latex Rash   Other Rash    "Sutures caused severe rash."   Sulfa Antibiotics     hives    Current Outpatient Medications  Medication Sig Dispense Refill   nystatin-triamcinolone ointment (MYCOLOG) Apply 1 Application topically 2 (two) times daily. 30 g 1   albuterol (VENTOLIN HFA) 108 (90 Base) MCG/ACT inhaler Inhale 2 puffs into the lungs every 6 (six) hours as needed for wheezing or shortness of breath. 18 g 6   Cyanocobalamin (B-12 COMPLIANCE INJECTION IJ) Inject as directed. weekly     Dulaglutide (TRULICITY) 1.5 OE/4.2PN SOPN Inject 1.5 mg into the skin once a week. 2 mL 0   Evolocumab (REPATHA SURECLICK) 361 MG/ML SOAJ Inject 1 Pen into the skin every 14 (fourteen) days. 2 mL 11   ibuprofen (ADVIL) 600 MG tablet Take 1 tablet (600 mg total) by mouth every 6 (six) hours as needed. 30 tablet 0   levothyroxine (SYNTHROID) 137 MCG tablet Take 137 mcg by mouth every morning.     losartan (COZAAR) 100 MG tablet Take 1 tablet (100 mg total) by mouth daily. 30 tablet 2   metoprolol succinate (TOPROL XL) 25 MG 24 hr tablet Take 1 tablet (25 mg total) by mouth daily. 90 tablet 3   Multiple Vitamin (MULTIVITAMIN WITH MINERALS) TABS tablet Take 1 tablet by mouth daily.     nitroGLYCERIN  (NITROSTAT) 0.4 MG SL tablet Place 1 tablet (0.4 mg total) under the tongue every 5 (five) minutes as needed for chest pain. 25 tablet 3   phentermine (ADIPEX-P) 37.5 MG tablet Take 1 tablet (37.5 mg total) by mouth daily before breakfast. 30 tablet 0   rosuvastatin (CRESTOR) 40 MG tablet TAKE 1 TABLET BY MOUTH DAILY. PT NEEDS TO KEEP UPCOMING APPT IN AUG FOR FURTHER REFILLS 90 tablet 3   venlafaxine XR (EFFEXOR-XR) 150 MG 24  hr capsule TAKE 1 CAPSULE BY MOUTH EVERY DAY WITH BREAKFAST 90 capsule 3   Vitamin D, Ergocalciferol, (DRISDOL) 1.25 MG (50000 UNIT) CAPS capsule Take 1 capsule (50,000 Units total) by mouth every 7 (seven) days. 4 capsule 0   No current facility-administered medications for this visit.    OBJECTIVE: White woman in no acute distress  There were no vitals filed for this visit.     There is no height or weight on file to calculate BMI.   Wt Readings from Last 3 Encounters:  12/02/21 199 lb (90.3 kg)  11/10/21 202 lb (91.6 kg)  09/30/21 204 lb (92.5 kg)     ECOG FS:1 - Symptomatic but completely ambulatory  Physical Exam Constitutional:      Appearance: Normal appearance.  HENT:     Head: Normocephalic and atraumatic.  Cardiovascular:     Rate and Rhythm: Normal rate and regular rhythm.     Pulses: Normal pulses.     Heart sounds: Normal heart sounds.  Chest:       Comments: Small palpable abnormality in the left breast as mentioned above. No palpable regional adenopathy Musculoskeletal:     Cervical back: Normal range of motion and neck supple. No rigidity.  Lymphadenopathy:     Cervical: No cervical adenopathy.  Skin:    General: Skin is warm and dry.  Neurological:     Mental Status: She is alert.     LAB RESULTS:  CMP     Component Value Date/Time   NA 144 12/02/2021 1525   K 4.1 12/02/2021 1525   CL 101 12/02/2021 1525   CO2 22 12/02/2021 1525   GLUCOSE 96 12/02/2021 1525   GLUCOSE 91 06/26/2021 1514   BUN 15 12/02/2021 1525    CREATININE 0.78 12/02/2021 1525   CREATININE 0.86 06/26/2021 1514   CALCIUM 9.9 12/02/2021 1525   PROT 7.0 06/26/2021 1514   PROT 6.8 05/22/2021 1041   ALBUMIN 4.0 06/26/2021 1514   ALBUMIN 4.6 05/22/2021 1041   AST 26 06/26/2021 1514   ALT 30 06/26/2021 1514   ALKPHOS 72 06/26/2021 1514   BILITOT 0.4 06/26/2021 1514   GFRNONAA >60 06/26/2021 1514   GFRAA >60 10/12/2019 0812   GFRAA >60 12/16/2017 1540    No results found for: "TOTALPROTELP", "ALBUMINELP", "A1GS", "A2GS", "BETS", "BETA2SER", "GAMS", "MSPIKE", "SPEI"  No results found for: "KPAFRELGTCHN", "LAMBDASER", "KAPLAMBRATIO"  Lab Results  Component Value Date   WBC 7.6 12/02/2021   NEUTROABS 3.0 06/26/2021   HGB 14.3 12/02/2021   HCT 43.1 12/02/2021   MCV 87 12/02/2021   PLT 349 12/02/2021   No results found for: "LABCA2"  No components found for: "QTMAUQ333"  No results for input(s): "INR" in the last 168 hours.  No results found for: "LABCA2"  No results found for: "LKT625"  No results found for: "CAN125"  No results found for: "CAN153"  No results found for: "CA2729"  No components found for: "HGQUANT"  No results found for: "CEA1", "CEA" / No results found for: "CEA1", "CEA"   No results found for: "AFPTUMOR"  No results found for: "CHROMOGRNA"  No results found for: "HGBA", "HGBA2QUANT", "HGBFQUANT", "HGBSQUAN" (Hemoglobinopathy evaluation)   No results found for: "LDH"  No results found for: "IRON", "TIBC", "IRONPCTSAT" (Iron and TIBC)  No results found for: "FERRITIN"  Urinalysis    Component Value Date/Time   COLORURINE YELLOW 10/02/2015 2022   APPEARANCEUR CLOUDY (A) 10/02/2015 2022   LABSPEC 1.020 10/02/2015 2022   PHURINE 6.5  10/02/2015 2022   GLUCOSEU NEGATIVE 10/02/2015 2022   HGBUR NEGATIVE 10/02/2015 2022   BILIRUBINUR NEGATIVE 10/02/2015 2022   KETONESUR 15 (A) 10/02/2015 2022   PROTEINUR NEGATIVE 10/02/2015 2022   UROBILINOGEN 0.2 09/20/2014 1536   NITRITE NEGATIVE  10/02/2015 2022   LEUKOCYTESUR SMALL (A) 10/02/2015 2022    STUDIES: MM DIAG BREAST TOMO UNI LEFT  Result Date: 01/26/2022 CLINICAL DATA:  Patient recalled from screening for left breast asymmetry. Patient had syncopal episode during prior attempted stereotactic guided core needle biopsy 2019. EXAM: DIGITAL DIAGNOSTIC UNILATERAL LEFT MAMMOGRAM WITH TOMOSYNTHESIS; ULTRASOUND LEFT BREAST LIMITED TECHNIQUE: Left digital diagnostic mammography and breast tomosynthesis was performed.; Targeted ultrasound examination of the left breast was performed. COMPARISON:  Previous exam(s). ACR Breast Density Category c: The breast tissue is heterogeneously dense, which may obscure small masses. FINDINGS: Additional imaging demonstrates a persistent irregular focal asymmetry within the far posterior left breast on the spot cc view. This is not well demonstrated on the true lateral view. Targeted ultrasound is performed, showing no definite sonographic correlate for the irregular asymmetry within the far posterior left breast. No left axillary adenopathy. IMPRESSION: Indeterminate irregular asymmetry within the far posterior left breast, for which a stereotactic guided core needle biopsy would be recommended. After discussion with the patient, she did not tolerate the prior attempts at stereotactic guided core needle biopsy and this would not be a good option for the patient. Given the subtle nature of the asymmetry, the possibility of a three-month follow-up versus a surgical excisional biopsy was discussed. Patient will have a surgical consultation for discussion of short three-month follow-up versus surgical excision. The lesion is subtle, and may potentially not be able to be localized for surgical excision given the difficult visualization on the true lateral view. RECOMMENDATION: Given the subtle nature of the asymmetry, the possibility of a three-month follow-up versus a surgical excisional biopsy was discussed.  Patient will have a surgical consultation for discussion of short three-month follow-up versus surgical excision. The lesion is subtle, and may potentially not be able to be localized for surgical excision given the difficult visualization on the true lateral view. I have discussed the findings and recommendations with the patient. If applicable, a reminder letter will be sent to the patient regarding the next appointment. BI-RADS CATEGORY  4: Suspicious. Electronically Signed   By: Lovey Newcomer M.D.   On: 01/26/2022 16:43  US BREAST LTD UNI LEFT INC AXILLA  Result Date: 01/26/2022 CLINICAL DATA:  Patient recalled from screening for left breast asymmetry. Patient had syncopal episode during prior attempted stereotactic guided core needle biopsy 2019. EXAM: DIGITAL DIAGNOSTIC UNILATERAL LEFT MAMMOGRAM WITH TOMOSYNTHESIS; ULTRASOUND LEFT BREAST LIMITED TECHNIQUE: Left digital diagnostic mammography and breast tomosynthesis was performed.; Targeted ultrasound examination of the left breast was performed. COMPARISON:  Previous exam(s). ACR Breast Density Category c: The breast tissue is heterogeneously dense, which may obscure small masses. FINDINGS: Additional imaging demonstrates a persistent irregular focal asymmetry within the far posterior left breast on the spot cc view. This is not well demonstrated on the true lateral view. Targeted ultrasound is performed, showing no definite sonographic correlate for the irregular asymmetry within the far posterior left breast. No left axillary adenopathy. IMPRESSION: Indeterminate irregular asymmetry within the far posterior left breast, for which a stereotactic guided core needle biopsy would be recommended. After discussion with the patient, she did not tolerate the prior attempts at stereotactic guided core needle biopsy and this would not be a good option for the  patient. Given the subtle nature of the asymmetry, the possibility of a three-month follow-up versus a  surgical excisional biopsy was discussed. Patient will have a surgical consultation for discussion of short three-month follow-up versus surgical excision. The lesion is subtle, and may potentially not be able to be localized for surgical excision given the difficult visualization on the true lateral view. RECOMMENDATION: Given the subtle nature of the asymmetry, the possibility of a three-month follow-up versus a surgical excisional biopsy was discussed. Patient will have a surgical consultation for discussion of short three-month follow-up versus surgical excision. The lesion is subtle, and may potentially not be able to be localized for surgical excision given the difficult visualization on the true lateral view. I have discussed the findings and recommendations with the patient. If applicable, a reminder letter will be sent to the patient regarding the next appointment. BI-RADS CATEGORY  4: Suspicious. Electronically Signed   By: Lovey Newcomer M.D.   On: 01/26/2022 16:43  LONG TERM MONITOR-LIVE TELEMETRY (3-14 DAYS)  Result Date: 01/08/2022 Patch Wear Time:  11 days and 23 hours (2023-10-02T12:23:53-0400 to 2023-10-14T12:22:20-0400) Sinus rhythm. (Minimum HR of 56 bpm, max HR of 203 bpm, and avg HR of 78 bpm). 2 Ventricular Tachycardia runs occurred, the longest was 9 beats. 13 Supraventricular Tachycardia runs occurred, the longest was 8.6 seconds. Premature atrial contractions (1.4%, [id number]) Rare premature ventricular contractions (<1.0%).   MM 3D SCREEN BREAST BILATERAL  Result Date: 01/07/2022 CLINICAL DATA:  Screening. EXAM: DIGITAL SCREENING BILATERAL MAMMOGRAM WITH TOMOSYNTHESIS AND CAD TECHNIQUE: Bilateral screening digital craniocaudal and mediolateral oblique mammograms were obtained. Bilateral screening digital breast tomosynthesis was performed. The images were evaluated with computer-aided detection. COMPARISON:  Previous exam(s). ACR Breast Density Category b: There are scattered areas of  fibroglandular density. FINDINGS: In the left breast, a possible asymmetry warrants further evaluation. In the right breast, no findings suspicious for malignancy. IMPRESSION: Further evaluation is suggested for possible asymmetry in the left breast. RECOMMENDATION: Diagnostic mammogram and possibly ultrasound of the left breast. (Code:[id number]) The patient will be contacted regarding the findings, and additional imaging will be scheduled. BI-RADS CATEGORY  0: Incomplete. Need additional imaging evaluation and/or prior mammograms for comparison. Electronically Signed   By: Abelardo Diesel M.D.   On: 01/07/2022 10:52     ELIGIBLE FOR AVAILABLE RESEARCH PROTOCOL: No   ASSESSMENT: 61 y.o. [name], [place] woman status post left breast upper outer quadrant lumpectomy 11/25/2017 for a pT1b pNX, stage Ia invasive ductal carcinoma, grade 1, estrogen and progesterone receptor positive, HER-2/neu negative, with an MIB-1 of 3%  (1) status post left axillary lymph node sampling 12/09/2017, all 4 sentinel lymph nodes clear  (2) Oncotype score of 24 predicts a 10 % risk of recurrence outside the breast over the next 9 years if the patient's only systemic therapy is an estrogens for 5 years.  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  (  7) exemestane started 10/12/2019, discontinued 11/26/2019 with intolerable  side effects  (a) bone density November 2021  (8) intensified screening:  (a) breast MRI yearly in March   (b) mammography yearly and September 2022   PLAN:  Bethany [name] is here for follow-up.   Since last visit, possible asymmetry in the left breast noted on mammogram. On PE, I feel a small abnormal density in the left breast around 11 0 clock She will FU with the surgeon and see if she can proceed with biopsy or lumpectomy. I will call her back in 2 weeks to follow up on this, or she was encouraged to call us as well. RTC in 6 months otherwise  Total time encounter: 30 minutes.  *Total Encounter Time as defined by the Centers for Medicare and Medicaid Services includes, in addition to the face-to-face time of a patient visit (documented in the note above) non-face-to-face time: obtaining and reviewing outside history, ordering and reviewing medications, tests or procedures, care coordination (communications with other health care professionals or caregivers) and documentation in the medical record.

## 2022-02-03 ENCOUNTER — Telehealth (INDEPENDENT_AMBULATORY_CARE_PROVIDER_SITE_OTHER): Payer: Commercial Managed Care - HMO | Admitting: Family Medicine

## 2022-02-03 ENCOUNTER — Encounter: Payer: Self-pay | Admitting: Hematology and Oncology

## 2022-02-03 ENCOUNTER — Encounter (INDEPENDENT_AMBULATORY_CARE_PROVIDER_SITE_OTHER): Payer: Self-pay | Admitting: Family Medicine

## 2022-02-03 DIAGNOSIS — E669 Obesity, unspecified: Secondary | ICD-10-CM

## 2022-02-03 DIAGNOSIS — R7303 Prediabetes: Secondary | ICD-10-CM | POA: Diagnosis not present

## 2022-02-03 DIAGNOSIS — R632 Polyphagia: Secondary | ICD-10-CM | POA: Diagnosis not present

## 2022-02-03 DIAGNOSIS — Z6832 Body mass index (BMI) 32.0-32.9, adult: Secondary | ICD-10-CM

## 2022-02-03 MED ORDER — TRULICITY 3 MG/0.5ML ~~LOC~~ SOAJ: 3.0000 mg | SUBCUTANEOUS | 0 refills | Status: DC

## 2022-02-03 MED ORDER — PHENTERMINE HCL 37.5 MG PO TABS: 37.5000 mg | ORAL_TABLET | Freq: Every day | ORAL | 0 refills | Status: DC

## 2022-02-03 NOTE — Progress Notes (Addendum)
TeleHealth Visit:  This visit was completed with telemedicine (audio/video) technology. Bethany [name] has verbally consented to this TeleHealth visit. The patient is located at home, the provider is located at home. The participants in this visit include the listed provider and patient. The visit was conducted today via MyChart video.  OBESITY Bethany [name] is here to discuss her progress with her obesity treatment plan along with follow-up of her obesity related diagnoses.   Today's visit was # 13 Starting weight: 203 lbs Starting date: 05/22/2021 Weight at last in office visit: 202 lbs on 01/07/22 Total weight loss: 1 lbs at last in office visit on 01/07/22. Today's reported weight: 198 lbs   Nutrition Plan: following a lower carbohydrate, vegetable and lean protein rich diet plan.   Current exercise: none   Interim History: Bethany [name] has had issues with sciatica and is currently on steroids.  At one point she lost down to 194 pounds but now she is back up to 198.  Still reflects a 4 pound weight loss since the end of August.  She is on both Trulicity and phentermine.  Appetite is well controlled.  She is doing the low-carb plan but still eats fruit. She is asking about starting [facility].  I advised that lower doses of Wegovy are not available currently.  Unknown if she has coverage.  Had an office appointment scheduled for 01/28/2022 with Dr. Jearld Lesch but she canceled this.  She would like to do all virtual visits for now. Assessment/Plan:  1. Prediabetes Last A1c was 5.7 on 05/22/2021. Medication(s): Trulicity 1.5 mg weekly.  Denies side effects.  She would like to increase the dose. Lab Results  Component Value Date   HGBA1C 5.7 (H) 05/22/2021   Lab Results  Component Value Date   INSULIN 15.5 05/22/2021   INSULIN 10.5 02/28/2020   INSULIN 11.1 10/16/2019    Plan: Increase dose of Trulicity to 3 mg weekly. Schedule labs at next office visit.  2. Polyphagia Prescribed phentermine  37.5 mg daily.  She has not been taking it consistently lately due to her issue with sciatica.  Sometimes she takes a half a tablet, sometimes a whole tablet.  Plan: Refill phentermine 37.5 mg daily. Has lost 4 pounds over the last 2 months so she has adequate weight loss to continue phentermine.   3. Obesity: Current BMI 32.6 Bethany [name] is currently in the action stage of change. As such, her goal is to continue with weight loss efforts.  She has agreed to following a lower carbohydrate, vegetable and lean protein rich diet plan.   Limit fruit to 1 serving per day.  Choose a low sugar fruits such as apples or berries.  Exercise goals: Resume walking as tolerated.  Behavioral modification strategies: increasing lean protein intake, decreasing simple carbohydrates, increasing water intake, meal planning and cooking strategies, holiday eating strategies , and planning for success.  Bethany [name] has agreed to follow-up with our clinic in 4 weeks.   No orders of the defined types were placed in this encounter.   Medications Discontinued During This Encounter  Medication Reason   levothyroxine (SYNTHROID) 137 MCG tablet    nystatin-triamcinolone ointment (MYCOLOG)    Dulaglutide (TRULICITY) 1.5 CN/4.7SJ SOPN Dose change   phentermine (ADIPEX-P) 37.5 MG tablet Reorder     Meds ordered this encounter  Medications   Dulaglutide (TRULICITY) 3 GG/8.3MO SOPN    Sig: Inject 3 mg as directed once a week.    Dispense:  2 mL    Refill:  0  Order Specific Question:   Supervising Provider    Answer:   Dell Ponto [[id number]]   phentermine (ADIPEX-P) 37.5 MG tablet    Sig: Take 1 tablet (37.5 mg total) by mouth daily before breakfast.    Dispense:  30 tablet    Refill:  0    Order Specific Question:   Supervising Provider    Answer:   Dell Ponto [[id number]]      Objective:   VITALS: Per patient if applicable, see vitals. GENERAL: Alert and in no acute distress. CARDIOPULMONARY: No increased WOB.  Speaking in clear sentences.  PSYCH: Pleasant and cooperative. Speech normal rate and rhythm. Affect is appropriate. Insight and judgement are appropriate. Attention is focused, linear, and appropriate.  NEURO: Oriented as arrived to appointment on time with no prompting.   Lab Results  Component Value Date   CREATININE 0.78 12/02/2021   BUN 15 12/02/2021   NA 144 12/02/2021   K 4.1 12/02/2021   CL 101 12/02/2021   CO2 22 12/02/2021   Lab Results  Component Value Date   ALT 30 06/26/2021   AST 26 06/26/2021   ALKPHOS 72 06/26/2021   BILITOT 0.4 06/26/2021   Lab Results  Component Value Date   HGBA1C 5.7 (H) 05/22/2021   HGBA1C 5.7 (H) 02/28/2020   HGBA1C 5.7 (H) 10/16/2019   HGBA1C 5.7 (H) 06/19/2014   Lab Results  Component Value Date   INSULIN 15.5 05/22/2021   INSULIN 10.5 02/28/2020   INSULIN 11.1 10/16/2019   Lab Results  Component Value Date   TSH 0.010 (L) 12/02/2021   Lab Results  Component Value Date   CHOL 128 06/30/2021   HDL 83 06/30/2021   LDLCALC 33 06/30/2021   TRIG 54 06/30/2021   CHOLHDL 1.5 06/30/2021   Lab Results  Component Value Date   WBC 7.6 12/02/2021   HGB 14.3 12/02/2021   HCT 43.1 12/02/2021   MCV 87 12/02/2021   PLT 349 12/02/2021   No results found for: "IRON", "TIBC", "FERRITIN" Lab Results  Component Value Date   VD25OH 32.7 02/28/2020   VD25OH 28.1 (L) 10/16/2019    Attestation Statements:   Reviewed by clinician on day of visit: allergies, medications, problem list, medical history, surgical history, family history, social history, and previous encounter notes.

## 2022-02-09 ENCOUNTER — Telehealth: Payer: Self-pay | Admitting: Hematology and Oncology

## 2022-02-09 NOTE — Telephone Encounter (Signed)
Contacted patient to scheduled appointments. Left message with appointment details and a call back number if patient had any questions or could not accommodate the time we provided.   

## 2022-02-13 ENCOUNTER — Inpatient Hospital Stay: Payer: Commercial Managed Care - HMO | Admitting: Hematology and Oncology

## 2022-02-15 NOTE — Progress Notes (Unsigned)
Office Visit    Patient Name: Bethany [name] Date of Encounter: 02/15/2022  PCP:  Leeroy Cha, [place]  Cardiologist:  [name], MD  Advanced Practice Provider:  No care team member to display Electrophysiologist:  None   HPI    Bethany [name] is a 61 y.o. female with a past medical history significant for CAD status post DES to LAD 06/2019, breast cancer 2019 status post radiation therapy, hiatal hernia, thyroid cancer, hypothyroidism, hypertension, vasovagal syncope, former tobacco abuse and anxiety presents today for follow-up appointment.  She was seen as a new patient by Dr. Angelena Form for evaluation of chest pain 05/15/2019.  She had an EGD per Dr. Henrene Pastor 04/12/2019 with no cause for chest pain found.  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  Echo 05/2019 showed normal LV systolic function, EF 66 5%, no valvular disease, mild asymmetric LVH, mildly elevated PASP (no evidence of MVP previously mentioned in [place]).  She underwent cardiac CTA 06/2019 which showed an LAD stenosis that appeared to be flow-limiting.  She previously had breast cancer in the left breast that was treated, with recurrence on the right around 2021.  She has been undergoing radiation therapy with plans lumpectomy.  Due to cardiac abnormalities, this was deferred.  She underwent LHC 06/2019 showing 80% LAD lesion, treated with PTCA/DES.  It was recommended that she would continue DAPT for 3 months with ASA plus Plavix and then stop for the breast surgery.  She was able to go on to have her surgery.  Office visit 10/2019 Dr. Angelena Form resume Plavix along with continuation of ASA.  Last OV 02/2021 indicated plan was to continue ASA plus statin but no longer on Plavix.  She had a syncope episode a week prior following urology procedure requiring nitric oxide and lidocaine.  She had woken up and saw a lot of blood that passed out.  Felt to be vasovagal.  Cardiac  work-up was reassuring.  In the interim, she was followed by Pharm.D. for lipid management/PCSK9 inhibitor.  She returned for follow-up 11/2021 and she was seen for recurrent syncope and near syncope.  She reported a longstanding history of vasovagal syncope for many years.  She usually gets a prodrome of feeling like she was going to pass out accompanied by a sense of racing heart rate.  No angina.  Today, she ***  Past Medical History    Past Medical History:  Diagnosis Date   ADD (attention deficit disorder)    Anxiety    B12 deficiency    Breast cancer ([place]) 2019   left breast cancer/diagnosed in 10/2017/taking radiation treatment until 03/15/18   Breast mass, left 11/2017   going thru radiation until 02/2018   CAD (coronary artery disease)    a. DES to LAD 06/2019.   Chest pain 06/19/2014   COPD (chronic obstructive pulmonary disease) (HCC)    beginning stages/small scar   Dental crowns present    Depression    Hiatal hernia    History of hiatal hernia    no current med.   History of thyroid cancer 12/15/2017   Hypertension    states under control with med., has been on med. x 1 yr.   Hypothyroidism    Malignant neoplasm of upper-outer quadrant of left breast in female, estrogen receptor positive ([place])    Mild hyperlipidemia    Mucocele of appendix 10/03/2015   MVP (mitral valve prolapse)    not  seen on echo 05/2019   Personal history of radiation therapy 2019   Left Breast Cancer   PONV (postoperative nausea and vomiting)    Prediabetes    Radiation fibrosis of lung ([place])    Syncope and collapse 12/25/2008   Qualifier: Diagnosis of  By: Philemon Kingdom     Urinary incontinence    USI    Vasovagal syncope    Vitamin D deficiency    Past Surgical History:  Procedure Laterality Date   ABDOMINAL HYSTERECTOMY     partial   APPENDECTOMY     AXILLARY SENTINEL NODE BIOPSY Left 12/09/2017   Procedure: AXILLARY SENTINEL NODE BIOPSY;  Surgeon: Erroll Luna, MD;   Location: [place];  Service: General;  Laterality: Left;   BREAST EXCISIONAL BIOPSY Right 09/20/2019   BREAST LUMPECTOMY Left 11/2017   BREAST LUMPECTOMY WITH NEEDLE LOCALIZATION Left 11/25/2017   Procedure: LEFT BREAST LUMPECTOMY WITH NEEDLE LOCALIZATION;  Surgeon: Erroll Luna, MD;  Location: [place];  Service: General;  Laterality: Left;   BREAST LUMPECTOMY WITH RADIOACTIVE SEED LOCALIZATION Right 09/20/2019   Procedure: RIGHT BREAST LUMPECTOMY WITH RADIOACTIVE SEED LOCALIZATION;  Surgeon: Erroll Luna, MD;  Location: [place];  Service: General;  Laterality: Right;   COLONOSCOPY WITH PROPOFOL  10/03/2015   CORONARY STENT INTERVENTION N/A 07/13/2019   Procedure: CORONARY STENT INTERVENTION;  Surgeon: Burnell Blanks, MD;  Location: [place] [facility];  Service: Cardiovascular;  Laterality: N/A;   LAPAROSCOPIC APPENDECTOMY N/A 10/03/2015   Procedure: APPENDECTOMY LAPAROSCOPIC;  Surgeon: Rolm Bookbinder, MD;  Location: [place];  Service: General;  Laterality: N/A;   LEFT HEART CATH AND CORONARY ANGIOGRAPHY N/A July 13, 2019   Procedure: LEFT HEART CATH AND CORONARY ANGIOGRAPHY;  Surgeon: Burnell Blanks, MD;  Location: [place] [facility];  Service: Cardiovascular;  Laterality: N/A;   TOTAL THYROIDECTOMY  04/02/2003    Allergies  Allergies  Allergen Reactions   Oxycodone Nausea And Vomiting   Latex Rash   Other Rash    "Sutures caused severe rash."   Sulfa Antibiotics     hives     EKGs/Labs/Other Studies Reviewed:   The following studies were reviewed today: LHC 07-13-2019            Mid LAD lesion is 80% stenosed.            A drug-eluting stent was successfully placed using a STENT RESOLUTE ONYX 3.0X18.            Post intervention, there is a 0% residual stenosis.   1. Severe stenosis mid LAD 2. Successful PTCA/DES x 1 mid LAD   Recommendations: 3 months of DAPT with ASA and Plavix and then will stop for  her breast surgery.    Echo 05/2019    1. Left ventricular ejection fraction, by estimation, is 60 to 65%. The  left ventricle has normal function. The left ventricle has no regional  wall motion abnormalities. There is mild asymmetric left ventricular  hypertrophy. Left ventricular diastolic  parameters were normal.   2. Right ventricular systolic function is normal. The right ventricular  size is normal. There is mildly elevated pulmonary artery systolic  pressure.   3. The mitral valve is normal in structure. No evidence of mitral valve  regurgitation. No evidence of mitral stenosis.   4. The aortic valve is normal in structure. Aortic valve regurgitation is  not visualized. No aortic stenosis is present.   EKG:  EKG is *** ordered today.  The ekg ordered today demonstrates ***  Recent Labs: 06/26/2021: ALT 30 12/02/2021: BUN 15; Creatinine, Ser 0.78; Hemoglobin 14.3; Magnesium 2.4; Platelets 349; Potassium 4.1; Sodium 144; TSH 0.010  Recent Lipid Panel    Component Value Date/Time   CHOL 128 06/30/2021 0955   TRIG 54 06/30/2021 0955   HDL 83 06/30/2021 0955   CHOLHDL 1.5 06/30/2021 0955   CHOLHDL 3.7 06/19/2014 2234   VLDL 18 06/19/2014 2234   LDLCALC 33 06/30/2021 0955    Risk Assessment/Calculations:  {Does this patient have ATRIAL FIBRILLATION?[phone]}  Home Medications   No outpatient medications have been marked as taking for the 02/16/22 encounter (Appointment) with Elgie Collard, PA-C.     Review of Systems   ***   All other systems reviewed and are otherwise negative except as noted above.  Physical Exam    VS:  There were no vitals taken for this visit. , BMI There is no height or weight on file to calculate BMI.  Wt Readings from Last 3 Encounters:  01/30/22 201 lb 9.6 oz (91.4 kg)  12/02/21 199 lb (90.3 kg)  11/10/21 202 lb (91.6 kg)     GEN: Well nourished, well developed, in no acute distress. HEENT: normal. Neck: Supple, no JVD, carotid  bruits, or masses. Cardiac: ***RRR, no murmurs, rubs, or gallops. No clubbing, cyanosis, edema.  ***Radials/PT 2+ and equal bilaterally.  Respiratory:  ***Respirations regular and unlabored, clear to auscultation bilaterally. GI: Soft, nontender, nondistended. MS: No deformity or atrophy. Skin: Warm and dry, no rash. Neuro:  Strength and sensation are intact. Psych: Normal affect.  Assessment & Plan    Recurrent syncope CAD Hyperlipidemia Essential hypertension  No BP recorded.  {Refresh Note OR Click here to enter BP  :1}***      Disposition: Follow up {follow up:15908} with [name], MD or APP.  Signed, Elgie Collard, PA-C 02/15/2022, 6:23 PM [facility]

## 2022-02-16 ENCOUNTER — Ambulatory Visit: Payer: Commercial Managed Care - HMO | Admitting: Physician Assistant

## 2022-02-16 ENCOUNTER — Telehealth: Payer: Self-pay | Admitting: Hematology and Oncology

## 2022-02-16 DIAGNOSIS — R55 Syncope and collapse: Secondary | ICD-10-CM

## 2022-02-16 DIAGNOSIS — E785 Hyperlipidemia, unspecified: Secondary | ICD-10-CM

## 2022-02-16 DIAGNOSIS — I25118 Atherosclerotic heart disease of native coronary artery with other forms of angina pectoris: Secondary | ICD-10-CM

## 2022-02-16 DIAGNOSIS — I10 Essential (primary) hypertension: Secondary | ICD-10-CM

## 2022-02-16 NOTE — Telephone Encounter (Signed)
Contacted patient to scheduled appointments. Left message with appointment details and a call back number if patient had any questions or could not accommodate the time we provided.   

## 2022-02-17 ENCOUNTER — Inpatient Hospital Stay: Payer: Commercial Managed Care - HMO | Admitting: Hematology and Oncology

## 2022-02-18 ENCOUNTER — Telehealth (INDEPENDENT_AMBULATORY_CARE_PROVIDER_SITE_OTHER): Payer: Commercial Managed Care - HMO | Admitting: Family Medicine

## 2022-02-20 ENCOUNTER — Ambulatory Visit: Payer: Self-pay | Admitting: Surgery

## 2022-02-24 ENCOUNTER — Other Ambulatory Visit: Payer: Self-pay

## 2022-02-24 ENCOUNTER — Encounter (HOSPITAL_COMMUNITY): Payer: Self-pay | Admitting: Surgery

## 2022-02-24 NOTE — Progress Notes (Signed)
PCP - Leeroy Cha, MD Cardiologist - [name], MD  PPM/ICD - denies  Chest x-ray - n/a EKG - 12/02/21 Stress Test - 12/31/08 ECHO - 12/12/21 Cardiac Cath - 06/29/19  CPAP - n/a  Fasting Blood Sugar - n/a Patient on Trulicity - last dose - 02/20/22 per patient Patient on Phentermine - last dose - 02/02/22 per patient  Blood Thinner Instructions: n/a Aspirin Instructions: Patient was instructed: As of today, STOP taking any Aspirin (unless otherwise instructed by your surgeon) Aleve, Naproxen, Ibuprofen, Motrin, Advil, Goody's, BC's, all herbal medications, fish oil, and all vitamins.  ERAS Protcol - yes, until 07:30 o'clock  COVID TEST- n/a  Anesthesia review: yes - cardiac history  Patient verbally denies any shortness of breath, fever, cough and chest pain during phone call   -------------  SDW INSTRUCTIONS given:  Your procedure is scheduled on Wednesday, December 13th, 2023.  Report to [facility] Main Entrance "A" at 08:00 A.M., and check in at the Admitting office.  Call this number if you have problems the morning of surgery:  [phone]   Remember:  Do not eat after midnight the night before your surgery  You may drink clear liquids until 07:30 the morning of your surgery.   Clear liquids allowed are: Water, Non-Citrus Juices (without pulp), Carbonated Beverages, Clear Tea, Black Coffee Only, and Gatorade    Take these medicines the morning of surgery with A SIP OF WATER - Metoprolol, Crestor, Synthroid, Effexor, Zyrtec PRN: inhaler, Nitroglycerin  Please bring the inhaler with you the day of surgery.     The day of surgery:             Do not wear jewelry, make up, or nail polish            Do not wear lotions, powders, perfumes, or deodorant.            Do not shave 48 hours prior to surgery.              Do not bring valuables to the hospital.            [facility] is not responsible for any belongings or valuables.  Do NOT  Smoke (Tobacco/Vaping) 24 hours prior to your procedure If you use a CPAP at night, you may bring all equipment for your overnight stay.   Contacts, glasses, dentures or bridgework may not be worn into surgery.      For patients admitted to the hospital, discharge time will be determined by your treatment team.   Patients discharged the day of surgery will not be allowed to drive home, and someone needs to stay with them for 24 hours.    Special instructions:   [place]- Preparing For Surgery  Before surgery, you can play an important role. Because skin is not sterile, your skin needs to be as free of germs as possible. You can reduce the number of germs on your skin by washing with CHG (chlorahexidine gluconate) Soap before surgery.  CHG is an antiseptic cleaner which kills germs and bonds with the skin to continue killing germs even after washing.    Oral Hygiene is also important to reduce your risk of infection.  Remember - BRUSH YOUR TEETH THE MORNING OF SURGERY WITH YOUR REGULAR TOOTHPASTE  Please do not use if you have an allergy to CHG or antibacterial soaps. If your skin becomes reddened/irritated stop using the CHG.  Do not shave (including legs and underarms) for at  least 48 hours prior to first CHG shower. It is OK to shave your face.  Please follow these instructions carefully.   Shower the NIGHT BEFORE SURGERY and the MORNING OF SURGERY with DIAL Soap.   Pat yourself dry with a CLEAN TOWEL.  Wear CLEAN PAJAMAS to bed the night before surgery  Place CLEAN SHEETS on your bed the night of your first shower and DO NOT SLEEP WITH PETS.   Day of Surgery: Please shower morning of surgery  Wear Clean/Comfortable clothing the morning of surgery Do not apply any deodorants/lotions.   Remember to brush your teeth WITH YOUR REGULAR TOOTHPASTE.   Questions were answered. Patient verbalized understanding of instructions.

## 2022-02-25 ENCOUNTER — Encounter (HOSPITAL_COMMUNITY)
Admission: RE | Disposition: A | Discharge status: Home / Self Care | Payer: Self-pay | Source: Home / Self Care | Attending: Surgery

## 2022-02-25 ENCOUNTER — Encounter (HOSPITAL_COMMUNITY): Payer: Self-pay | Admitting: Surgery

## 2022-02-25 ENCOUNTER — Other Ambulatory Visit: Payer: Self-pay

## 2022-02-25 ENCOUNTER — Ambulatory Visit (HOSPITAL_BASED_OUTPATIENT_CLINIC_OR_DEPARTMENT_OTHER): Payer: Commercial Managed Care - HMO | Admitting: General Practice

## 2022-02-25 ENCOUNTER — Ambulatory Visit (HOSPITAL_COMMUNITY): Payer: Commercial Managed Care - HMO | Admitting: General Practice

## 2022-02-25 ENCOUNTER — Ambulatory Visit (HOSPITAL_COMMUNITY)
Admission: RE | Admit: 2022-02-25 | Discharge: 2022-02-25 | Disposition: A | Discharge status: Home / Self Care | Payer: Commercial Managed Care - HMO | Attending: Surgery | Admitting: Surgery

## 2022-02-25 DIAGNOSIS — Z87891 Personal history of nicotine dependence: Secondary | ICD-10-CM

## 2022-02-25 DIAGNOSIS — F419 Anxiety disorder, unspecified: Secondary | ICD-10-CM | POA: Diagnosis not present

## 2022-02-25 DIAGNOSIS — I2510 Atherosclerotic heart disease of native coronary artery without angina pectoris: Secondary | ICD-10-CM | POA: Diagnosis not present

## 2022-02-25 DIAGNOSIS — Z7985 Long-term (current) use of injectable non-insulin antidiabetic drugs: Secondary | ICD-10-CM | POA: Insufficient documentation

## 2022-02-25 DIAGNOSIS — Z7984 Long term (current) use of oral hypoglycemic drugs: Secondary | ICD-10-CM | POA: Diagnosis not present

## 2022-02-25 DIAGNOSIS — Z853 Personal history of malignant neoplasm of breast: Secondary | ICD-10-CM | POA: Diagnosis not present

## 2022-02-25 DIAGNOSIS — E785 Hyperlipidemia, unspecified: Secondary | ICD-10-CM | POA: Diagnosis not present

## 2022-02-25 DIAGNOSIS — F32A Depression, unspecified: Secondary | ICD-10-CM | POA: Diagnosis not present

## 2022-02-25 DIAGNOSIS — Z79899 Other long term (current) drug therapy: Secondary | ICD-10-CM | POA: Diagnosis not present

## 2022-02-25 DIAGNOSIS — Z79624 Long term (current) use of inhibitors of nucleotide synthesis: Secondary | ICD-10-CM | POA: Insufficient documentation

## 2022-02-25 DIAGNOSIS — J449 Chronic obstructive pulmonary disease, unspecified: Secondary | ICD-10-CM | POA: Insufficient documentation

## 2022-02-25 DIAGNOSIS — I700 Atherosclerosis of aorta: Secondary | ICD-10-CM | POA: Diagnosis not present

## 2022-02-25 DIAGNOSIS — E039 Hypothyroidism, unspecified: Secondary | ICD-10-CM | POA: Insufficient documentation

## 2022-02-25 DIAGNOSIS — N6321 Unspecified lump in the left breast, upper outer quadrant: Secondary | ICD-10-CM | POA: Diagnosis present

## 2022-02-25 DIAGNOSIS — Z7902 Long term (current) use of antithrombotics/antiplatelets: Secondary | ICD-10-CM | POA: Insufficient documentation

## 2022-02-25 DIAGNOSIS — R7303 Prediabetes: Secondary | ICD-10-CM | POA: Diagnosis not present

## 2022-02-25 DIAGNOSIS — Z85850 Personal history of malignant neoplasm of thyroid: Secondary | ICD-10-CM | POA: Insufficient documentation

## 2022-02-25 DIAGNOSIS — N6032 Fibrosclerosis of left breast: Secondary | ICD-10-CM | POA: Diagnosis not present

## 2022-02-25 DIAGNOSIS — Z955 Presence of coronary angioplasty implant and graft: Secondary | ICD-10-CM | POA: Insufficient documentation

## 2022-02-25 DIAGNOSIS — I10 Essential (primary) hypertension: Secondary | ICD-10-CM | POA: Diagnosis not present

## 2022-02-25 DIAGNOSIS — M1990 Unspecified osteoarthritis, unspecified site: Secondary | ICD-10-CM | POA: Insufficient documentation

## 2022-02-25 DIAGNOSIS — Z79890 Hormone replacement therapy (postmenopausal): Secondary | ICD-10-CM | POA: Insufficient documentation

## 2022-02-25 HISTORY — DX: Prediabetes: R73.03

## 2022-02-25 HISTORY — PX: BREAST LUMPECTOMY: SHX2

## 2022-02-25 LAB — CBC
HCT: 40.8 % (ref 36.0–46.0)
Hemoglobin: 13.4 g/dL (ref 12.0–15.0)
MCH: 29.5 pg (ref 26.0–34.0)
MCHC: 32.8 g/dL (ref 30.0–36.0)
MCV: 89.9 fL (ref 80.0–100.0)
Platelets: 303 10*3/uL (ref 150–400)
RBC: 4.54 MIL/uL (ref 3.87–5.11)
RDW: 13.2 % (ref 11.5–15.5)
WBC: 6.9 10*3/uL (ref 4.0–10.5)
nRBC: 0 % (ref 0.0–0.2)

## 2022-02-25 LAB — BASIC METABOLIC PANEL
Anion gap: 9 (ref 5–15)
BUN: 14 mg/dL (ref 6–20)
CO2: 23 mmol/L (ref 22–32)
Calcium: 9 mg/dL (ref 8.9–10.3)
Chloride: 106 mmol/L (ref 98–111)
Creatinine, Ser: 0.92 mg/dL (ref 0.44–1.00)
GFR, Estimated: >60 mL/min (ref >60)
Glucose, Bld: 97 mg/dL (ref 70–99)
Potassium: 3.6 mmol/L (ref 3.5–5.1)
Sodium: 138 mmol/L (ref 135–145)

## 2022-02-25 SURGERY — BREAST LUMPECTOMY
Anesthesia: General | Site: Breast | Laterality: Left

## 2022-02-25 MED ORDER — CHLORHEXIDINE GLUCONATE CLOTH 2 % EX PADS: 6.0000 | MEDICATED_PAD | Freq: Once | CUTANEOUS | Status: DC

## 2022-02-25 MED ORDER — SCOPOLAMINE 1 MG/3DAYS TD PT72
MEDICATED_PATCH | TRANSDERMAL | Status: AC
  Filled 2022-02-25: qty 1

## 2022-02-25 MED ORDER — AMISULPRIDE (ANTIEMETIC) 5 MG/2ML IV SOLN: 10.0000 mg | Freq: Once | INTRAVENOUS | Status: DC | PRN

## 2022-02-25 MED ORDER — ACETAMINOPHEN 500 MG PO TABS
1000.0000 mg | ORAL_TABLET | ORAL | Status: AC
  Administered 2022-02-25: 1000 mg via ORAL
  Filled 2022-02-25: qty 2

## 2022-02-25 MED ORDER — PHENYLEPHRINE 80 MCG/ML (10ML) SYRINGE FOR IV PUSH (FOR BLOOD PRESSURE SUPPORT)
PREFILLED_SYRINGE | INTRAVENOUS | Status: AC
  Filled 2022-02-25: qty 10

## 2022-02-25 MED ORDER — ONDANSETRON HCL 4 MG/2ML IJ SOLN
INTRAMUSCULAR | Status: DC | PRN
  Administered 2022-02-25: 4 mg via INTRAVENOUS

## 2022-02-25 MED ORDER — IBUPROFEN 800 MG PO TABS: 800.0000 mg | ORAL_TABLET | Freq: Three times a day (TID) | ORAL | 0 refills | Status: DC | PRN

## 2022-02-25 MED ORDER — FENTANYL CITRATE (PF) 250 MCG/5ML IJ SOLN
INTRAMUSCULAR | Status: DC | PRN
  Administered 2022-02-25: 50 ug via INTRAVENOUS

## 2022-02-25 MED ORDER — ACETAMINOPHEN 500 MG PO TABS: 1000.0000 mg | ORAL_TABLET | Freq: Once | ORAL | Status: DC

## 2022-02-25 MED ORDER — LIDOCAINE 2% (20 MG/ML) 5 ML SYRINGE
INTRAMUSCULAR | Status: DC | PRN
  Administered 2022-02-25: 60 mg via INTRAVENOUS

## 2022-02-25 MED ORDER — CHLORHEXIDINE GLUCONATE 0.12 % MT SOLN
OROMUCOSAL | Status: AC
  Filled 2022-02-25: qty 15

## 2022-02-25 MED ORDER — SCOPOLAMINE 1 MG/3DAYS TD PT72
MEDICATED_PATCH | TRANSDERMAL | Status: DC | PRN
  Administered 2022-02-25: 1 via TRANSDERMAL

## 2022-02-25 MED ORDER — BUPIVACAINE HCL (PF) 0.25 % IJ SOLN
INTRAMUSCULAR | Status: AC
  Filled 2022-02-25: qty 30

## 2022-02-25 MED ORDER — ONDANSETRON HCL 4 MG/2ML IJ SOLN: 4.0000 mg | Freq: Once | INTRAMUSCULAR | Status: DC | PRN

## 2022-02-25 MED ORDER — LACTATED RINGERS IV SOLN: INTRAVENOUS | Status: DC

## 2022-02-25 MED ORDER — MIDAZOLAM HCL 2 MG/2ML IJ SOLN
INTRAMUSCULAR | Status: DC | PRN
  Administered 2022-02-25: 2 mg via INTRAVENOUS

## 2022-02-25 MED ORDER — PHENYLEPHRINE 80 MCG/ML (10ML) SYRINGE FOR IV PUSH (FOR BLOOD PRESSURE SUPPORT)
PREFILLED_SYRINGE | INTRAVENOUS | Status: DC | PRN
  Administered 2022-02-25 (×3): 80 ug via INTRAVENOUS
  Administered 2022-02-25: 160 ug via INTRAVENOUS
  Administered 2022-02-25 (×3): 80 ug via INTRAVENOUS
  Administered 2022-02-25: 160 ug via INTRAVENOUS

## 2022-02-25 MED ORDER — LIDOCAINE 2% (20 MG/ML) 5 ML SYRINGE
INTRAMUSCULAR | Status: AC
  Filled 2022-02-25: qty 5

## 2022-02-25 MED ORDER — CEFAZOLIN SODIUM-DEXTROSE 2-4 GM/100ML-% IV SOLN
2.0000 g | INTRAVENOUS | Status: AC
  Administered 2022-02-25: 2 g via INTRAVENOUS
  Filled 2022-02-25: qty 100

## 2022-02-25 MED ORDER — ONDANSETRON HCL 4 MG/2ML IJ SOLN
INTRAMUSCULAR | Status: AC
  Filled 2022-02-25: qty 2

## 2022-02-25 MED ORDER — FENTANYL CITRATE (PF) 250 MCG/5ML IJ SOLN
INTRAMUSCULAR | Status: AC
  Filled 2022-02-25: qty 5

## 2022-02-25 MED ORDER — MIDAZOLAM HCL 2 MG/2ML IJ SOLN
INTRAMUSCULAR | Status: AC
  Filled 2022-02-25: qty 2

## 2022-02-25 MED ORDER — DEXAMETHASONE SODIUM PHOSPHATE 10 MG/ML IJ SOLN
INTRAMUSCULAR | Status: AC
  Filled 2022-02-25: qty 1

## 2022-02-25 MED ORDER — HYDROCODONE-ACETAMINOPHEN 10-325 MG PO TABS: 1.0000 | ORAL_TABLET | Freq: Four times a day (QID) | ORAL | 0 refills | Status: DC | PRN

## 2022-02-25 MED ORDER — 0.9 % SODIUM CHLORIDE (POUR BTL) OPTIME
TOPICAL | Status: DC | PRN
  Administered 2022-02-25: 1000 mL

## 2022-02-25 MED ORDER — DEXAMETHASONE SODIUM PHOSPHATE 10 MG/ML IJ SOLN
INTRAMUSCULAR | Status: DC | PRN
  Administered 2022-02-25: 10 mg via INTRAVENOUS

## 2022-02-25 MED ORDER — HYDROMORPHONE HCL 1 MG/ML IJ SOLN: 0.2500 mg | INTRAMUSCULAR | Status: DC | PRN

## 2022-02-25 MED ORDER — CHLORHEXIDINE GLUCONATE 0.12 % MT SOLN
15.0000 mL | OROMUCOSAL | Status: AC
  Administered 2022-02-25: 15 mL via OROMUCOSAL

## 2022-02-25 MED ORDER — BUPIVACAINE HCL 0.25 % IJ SOLN
INTRAMUSCULAR | Status: DC | PRN
  Administered 2022-02-25: 30 mL

## 2022-02-25 MED ORDER — PROPOFOL 10 MG/ML IV BOLUS
INTRAVENOUS | Status: DC | PRN
  Administered 2022-02-25: 150 mg via INTRAVENOUS

## 2022-02-25 SURGICAL SUPPLY — 41 items
ADH SKN CLS APL DERMABOND .7 (GAUZE/BANDAGES/DRESSINGS) ×1
APL PRP STRL LF DISP 70% ISPRP (MISCELLANEOUS) ×1
APPLIER CLIP 9.375 MED OPEN (MISCELLANEOUS)
APR CLP MED 9.3 20 MLT OPN (MISCELLANEOUS)
BAG COUNTER SPONGE SURGICOUNT (BAG) ×2 IMPLANT
BAG SPNG CNTER NS LX DISP (BAG) ×1
BINDER BREAST LRG (GAUZE/BANDAGES/DRESSINGS) IMPLANT
BINDER BREAST XLRG (GAUZE/BANDAGES/DRESSINGS) IMPLANT
CANISTER SUCT 3000ML PPV (MISCELLANEOUS) IMPLANT
CHLORAPREP W/TINT 26 (MISCELLANEOUS) ×2 IMPLANT
CLIP APPLIE 9.375 MED OPEN (MISCELLANEOUS) IMPLANT
COVER PROBE W GEL 5X96 (DRAPES) ×2 IMPLANT
COVER SURGICAL LIGHT HANDLE (MISCELLANEOUS) ×2 IMPLANT
DERMABOND ADVANCED .7 DNX12 (GAUZE/BANDAGES/DRESSINGS) ×2 IMPLANT
DEVICE DUBIN SPECIMEN MAMMOGRA (MISCELLANEOUS) ×2 IMPLANT
DRAPE CHEST BREAST 15X10 FENES (DRAPES) ×2 IMPLANT
ELECT CAUTERY BLADE 6.4 (BLADE) ×2 IMPLANT
ELECT REM PT RETURN 9FT ADLT (ELECTROSURGICAL) ×1
ELECTRODE REM PT RTRN 9FT ADLT (ELECTROSURGICAL) ×2 IMPLANT
GLOVE BIO SURGEON STRL SZ8 (GLOVE) ×2 IMPLANT
GLOVE BIOGEL PI IND STRL 8 (GLOVE) ×2 IMPLANT
GOWN STRL REUS W/ TWL LRG LVL3 (GOWN DISPOSABLE) ×2 IMPLANT
GOWN STRL REUS W/ TWL XL LVL3 (GOWN DISPOSABLE) ×2 IMPLANT
GOWN STRL REUS W/TWL LRG LVL3 (GOWN DISPOSABLE) ×1
GOWN STRL REUS W/TWL XL LVL3 (GOWN DISPOSABLE) ×1
KIT BASIN OR (CUSTOM PROCEDURE TRAY) ×2 IMPLANT
KIT MARKER MARGIN INK (KITS) IMPLANT
LIGHT WAVEGUIDE WIDE FLAT (MISCELLANEOUS) IMPLANT
NDL HYPO 25GX1X1/2 BEV (NEEDLE) IMPLANT
NEEDLE HYPO 25GX1X1/2 BEV (NEEDLE) ×1 IMPLANT
NS IRRIG 1000ML POUR BTL (IV SOLUTION) IMPLANT
PACK GENERAL/GYN (CUSTOM PROCEDURE TRAY) ×2 IMPLANT
SUT MNCRL AB 4-0 PS2 18 (SUTURE) ×2 IMPLANT
SUT SILK 2 0 SH (SUTURE) IMPLANT
SUT VIC AB 2-0 SH 27 (SUTURE) ×1
SUT VIC AB 2-0 SH 27XBRD (SUTURE) IMPLANT
SUT VIC AB 3-0 SH 27 (SUTURE)
SUT VIC AB 3-0 SH 27X BRD (SUTURE) IMPLANT
SUT VIC AB 3-0 SH 8-18 (SUTURE) ×2 IMPLANT
SYR CONTROL 10ML LL (SYRINGE) IMPLANT
TOWEL GREEN STERILE FF (TOWEL DISPOSABLE) IMPLANT

## 2022-02-25 NOTE — Discharge Instructions (Signed)
[facility] Office Phone Number [phone]  BREAST BIOPSY/ PARTIAL MASTECTOMY: POST OP INSTRUCTIONS  Always review your discharge instruction sheet given to you by the facility where your surgery was performed.  IF YOU HAVE DISABILITY OR FAMILY LEAVE FORMS, YOU MUST BRING THEM TO THE OFFICE FOR PROCESSING.  DO NOT GIVE THEM TO YOUR DOCTOR.  A prescription for pain medication may be given to you upon discharge.  Take your pain medication as prescribed, if needed.  If narcotic pain medicine is not needed, then you may take acetaminophen (Tylenol) or ibuprofen (Advil) as needed. Take your usually prescribed medications unless otherwise directed If you need a refill on your pain medication, please contact your pharmacy.  They will contact our office to request authorization.  Prescriptions will not be filled after 5pm or on week-ends. You should eat very light the first 24 hours after surgery, such as soup, crackers, pudding, etc.  Resume your normal diet the day after surgery. Most patients will experience some swelling and bruising in the breast.  Ice packs and a good support bra will help.  Swelling and bruising can take several days to resolve.  It is common to experience some constipation if taking pain medication after surgery.  Increasing fluid intake and taking a stool softener will usually help or prevent this problem from occurring.  A mild laxative (Milk of Magnesia or Miralax) should be taken according to package directions if there are no bowel movements after 48 hours. Unless discharge instructions indicate otherwise, you may remove your bandages 24-48 hours after surgery, and you may shower at that time.  You may have steri-strips (small skin tapes) in place directly over the incision.  These strips should be left on the skin for 7-10 days.  If your surgeon used skin glue on the incision, you may shower in 24 hours.  The glue will flake off over the next 2-3 weeks.  Any  sutures or staples will be removed at the office during your follow-up visit. ACTIVITIES:  You may resume regular daily activities (gradually increasing) beginning the next day.  Wearing a good support bra or sports bra minimizes pain and swelling.  You may have sexual intercourse when it is comfortable. You may drive when you no longer are taking prescription pain medication, you can comfortably wear a seatbelt, and you can safely maneuver your car and apply brakes. RETURN TO WORK:  ______________________________________________________________________________________ You should see your doctor in the office for a follow-up appointment approximately two weeks after your surgery.  Your doctor's nurse will typically make your follow-up appointment when she calls you with your pathology report.  Expect your pathology report 2-3 business days after your surgery.  You may call to check if you do not hear from us after three days. OTHER INSTRUCTIONS: _______________________________________________________________________________________________ _____________________________________________________________________________________________________________________________________ _____________________________________________________________________________________________________________________________________ _____________________________________________________________________________________________________________________________________  WHEN TO CALL YOUR DOCTOR: Fever over 101.0 Nausea and/or vomiting. Extreme swelling or bruising. Continued bleeding from incision. Increased pain, redness, or drainage from the incision.  The clinic staff is available to answer your questions during regular business hours.  Please don't hesitate to call and ask to speak to one of the nurses for clinical concerns.  If you have a medical emergency, go to the nearest emergency room or call 911.  A surgeon from [facility] is always on call at the hospital.  For further questions, please visit [email or web address]   

## 2022-02-25 NOTE — Anesthesia Procedure Notes (Signed)
Procedure Name: LMA Insertion Date/Time: 02/25/2022 10:20 AM  Performed by: Dorann Lodge, CRNAPre-anesthesia Checklist: Patient identified, Emergency Drugs available, Suction available and Patient being monitored Patient Re-evaluated:Patient Re-evaluated prior to induction Oxygen Delivery Method: Circle System Utilized Preoxygenation: Pre-oxygenation with 100% oxygen Induction Type: IV induction Ventilation: Mask ventilation without difficulty LMA: LMA inserted LMA Size: 4.0 Number of attempts: 1 Airway Equipment and Method: Bite block Placement Confirmation: positive ETCO2 Tube secured with: Tape Dental Injury: Teeth and Oropharynx as per pre-operative assessment

## 2022-02-25 NOTE — Transfer of Care (Signed)
Immediate Anesthesia Transfer of Care Note  Patient: Bethany [name]  Procedure(s) Performed: LEFT BREAST LUMPECTOMY (Left: Breast)  Patient Location: PACU  Anesthesia Type:General  Level of Consciousness: awake and alert   Airway & Oxygen Therapy: Patient Spontanous Breathing  Post-op Assessment: Report given to RN and Post -op Vital signs reviewed and stable  Post vital signs: Reviewed and stable  Last Vitals:  Vitals Value Taken Time  BP    Temp    Pulse 67 02/25/22 1123  Resp 12 02/25/22 1123  SpO2 94 % 02/25/22 1123  Vitals shown include unvalidated device data.  Last Pain:  Vitals:   02/25/22 0839  TempSrc:   PainSc: 0-No pain      Patients Stated Pain Goal: 0 (53/66/44 0347)  Complications: No notable events documented.

## 2022-02-25 NOTE — H&P (Signed)
Chief Complaint: Breast Mass   History of Present Illness: Bethany [name] is a 61 y.o. female who is seen today for recheck after lumpectomy in 2019 for stage I left breast cancer. She notices a lump in her scar. Imaging was inconclusive but a density was noted in the upper outer quadrant left breast. This is the area that she is recently felt that it feels different. No redness or drainage.Jeannie Done" had routine screening mammography on 10/26/2017 showing a possible abnormality in the left breast. She underwent unilateral left diagnostic mammography with tomography and left breast ultrasonography at [facility] on 10/19/2017 showing: breast density category B. There was a hypoechoic lesion consistent of a mass at the 2:30 o'clock upper outer quadrant middle depth and measuring 0.5 x 0.3 x 0.4 cm. Sonographic evaluation of the left axilla shows no enlarged or abnormal lymph nodes.  An attempt was made to obtain a biopsy of this lesion on 11/02/2017, however the patient had repeated episodes of syncope during the attempted procedure.  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 Margins were negative for carcinoma. Prognostic indicators significant for: estrogen receptor, 90% positive and progesterone receptor, 80% positive, both with strong staining intensity. Proliferation marker Ki67 at 3%. HER2 negative with an immunohistochemistry of (1+).  Then in a separate procedure she underwent left sentinel lymph node sampling on 12/09/2017 showing ([id number]): Four left axillary sentinel lymph were negative for carcinoma. (0/4).   Review of Systems: A complete review of systems was obtained from the patient. I have reviewed this information and discussed as appropriate with the patient. See HPI as well for other ROS.    Medical History: Past Medical History:  Diagnosis Date  Anxiety  History of cancer   Thyroid disease   Patient Active Problem List  Diagnosis  Chest pain  Class 1 obesity due to excess calories with body mass index (BMI) of 30.0 to 30.9 in adult  History of thyroid cancer  Hyperlipidemia  Hypertension  Malignant neoplasm of upper-outer quadrant of left breast in female, estrogen receptor positive  Mucocele of appendix  Prediabetes  Primary localized osteoarthrosis, hand  Syncope and collapse  Unstable angina (CMS-HCC)  Vitamin D deficiency   Past Surgical History:  Procedure Laterality Date  APPENDECTOMY  MASTECTOMY PARTIAL / LUMPECTOMY  THYROIDECTOMY TOTAL    Allergies  Allergen Reactions  Oxycodone Nausea And Vomiting  Latex Rash  Other Rash  "Sutures caused severe rash." "Sutures caused severe rash."  Sulfa (Sulfonamide Antibiotics) Other (See Comments) and Nausea  hives hives   Current Outpatient Medications on File Prior to Visit  Medication Sig Dispense Refill  albuterol 90 mcg/actuation inhaler Inhale into the lungs  ergocalciferol, vitamin D2, 1,250 mcg (50,000 unit) capsule Take by mouth  ibuprofen (MOTRIN) 600 MG tablet Take 600 mg by mouth every 6 (six) hours as needed  losartan (COZAAR) 100 MG tablet Take 100 mg by mouth once daily  metoprolol succinate (TOPROL-XL) 25 MG XL tablet Take 1 tablet by mouth once daily  multivitamin with minerals tablet Take 1 tablet by mouth once daily  REPATHA SURECLICK 784 mg/mL PnIj Inject subcutaneously  rosuvastatin (CRESTOR) 40 MG tablet Take 40 mg by mouth once daily  TRULICITY 4.5 ON/6.2 mL subcutaneous pen injector INJECT 4.5 MG AS DIRECTED ONCE A WEEK.  venlafaxine (EFFEXOR-XR) 150 MG XR capsule TAKE 1 CAPSULE BY MOUTH EVERY DAY WITH BREAKFAST  aspirin 81 MG EC  tablet Take 81 mg by mouth once daily  bacitracin zinc ointment Apply topically 2 (two) times daily APPLY TO AFFECTED AREA  clopidogreL (PLAVIX) 75 mg tablet Take by mouth  exemestane (AROMASIN) 25 mg tablet  fluorouraciL (EFUDEX) 5 %  cream  isosorbide mononitrate (IMDUR) 30 MG ER tablet  levothyroxine (SYNTHROID) 137 MCG tablet  levothyroxine (SYNTHROID) 150 MCG tablet  losartan-hydrochlorothiazide (HYZAAR) 50-12.5 mg tablet Take 1 tablet by mouth once daily  metFORMIN (GLUCOPHAGE) 500 MG tablet  nitroGLYcerin (NITROSTAT) 0.4 MG SL tablet Place under the tongue nightly as needed  pediatric multivitamin chewable tablet Take 1 tablet by mouth once daily  phentermine-topiramate 11.25-69 mg CM24 Take 1 capsule by mouth once daily  QSYMIA 11.25-69 mg CM24 Take 1 capsule by mouth once daily  TROKENDI XR 50 mg XR capsule Take 50 mg by mouth once daily  valACYclovir (VALTREX) 500 MG tablet TAKE 1 TABLET BY MOUTH AS DIRECTED TAKE ONE DAILY FOR 3 WEEKS   No current facility-administered medications on file prior to visit.   Family History  Problem Relation Age of Onset  High blood pressure (Hypertension) Mother  Hyperlipidemia (Elevated cholesterol) Mother    Social History   Tobacco Use  Smoking Status Former  Packs/day: 0.50  Years: 20.00  Additional pack years: 0.00  Total pack years: 10.00  Types: Cigarettes  Smokeless Tobacco Never    Social History   Socioeconomic History  Marital status: Married  Tobacco Use  Smoking status: Former  Packs/day: 0.50  Years: 20.00  Additional pack years: 0.00  Total pack years: 10.00  Types: Cigarettes  Smokeless tobacco: Never  Vaping Use  Vaping Use: Former   Objective:   There were no vitals filed for this visit.  There is no height or weight on file to calculate BMI.  Physical Exam HENT:  Head: Normocephalic.  Cardiovascular:  Rate and Rhythm: Normal rate.  Pulmonary:  Effort: Pulmonary effort is normal.  Breath sounds: No stridor.  Chest:  Breasts: Right: Normal.   Comments: A 1 cm mobile mass noted along the superior aspect of the left breast lumpectomy scar. Musculoskeletal:  Cervical back: Normal range of motion.  Lymphadenopathy:  Upper  Body:  Right upper body: No supraclavicular or axillary adenopathy.  Left upper body: No supraclavicular or axillary adenopathy.  Skin: General: Skin is warm.  Neurological:  General: No focal deficit present.  Mental Status: She is alert.  Psychiatric:  Mood and Affect: Mood normal.     Labs, Imaging and Diagnostic Testing:  CLINICAL DATA: Patient recalled from screening for left breast asymmetry. Patient had syncopal episode during prior attempted stereotactic guided core needle biopsy 2019.  EXAM: DIGITAL DIAGNOSTIC UNILATERAL LEFT MAMMOGRAM WITH TOMOSYNTHESIS; ULTRASOUND LEFT BREAST LIMITED  TECHNIQUE: Left digital diagnostic mammography and breast tomosynthesis was performed.; Targeted ultrasound examination of the left breast was performed.  COMPARISON: Previous exam(s).  ACR Breast Density Category c: The breast tissue is heterogeneously dense, which may obscure small masses.  FINDINGS: Additional imaging demonstrates a persistent irregular focal asymmetry within the far posterior left breast on the spot cc view. This is not well demonstrated on the true lateral view.  Targeted ultrasound is performed, showing no definite sonographic correlate for the irregular asymmetry within the far posterior left breast. No left axillary adenopathy.  IMPRESSION: Indeterminate irregular asymmetry within the far posterior left breast, for which a stereotactic guided core needle biopsy would be recommended. After discussion with the patient, she did not tolerate the prior attempts at stereotactic  guided core needle biopsy and this would not be a good option for the patient. Given the subtle nature of the asymmetry, the possibility of a three-month follow-up versus a surgical excisional biopsy was discussed. Patient will have a surgical consultation for discussion of short three-month follow-up versus surgical excision. The lesion is subtle, and may potentially not be able  to be localized for surgical excision given the difficult visualization on the true lateral view.  RECOMMENDATION: Given the subtle nature of the asymmetry, the possibility of a three-month follow-up versus a surgical excisional biopsy was discussed. Patient will have a surgical consultation for discussion of short three-month follow-up versus surgical excision. The lesion is subtle, and may potentially not be able to be localized for surgical excision given the difficult visualization on the true lateral view.  I have discussed the findings and recommendations with the patient. If applicable, a reminder letter will be sent to the patient regarding the next appointment.  BI-RADS CATEGORY 4: Suspicious.   Electronically Signed By: Lovey Newcomer M.D. On: 01/26/2022 16:43  Assessment and Plan:   Diagnoses and all orders for this visit:  Mass of upper outer quadrant of left breast  Malignant neoplasm of upper-outer quadrant of left breast in female, estrogen receptor positive     Recommend lumpectomy of this area since it is new. The imaging is quite nondescript therefore core biopsy is a possibility. She became syncopal low from that therefore does not want to repeat that. Recommend a lumpectomy left breast for the new nodule. Risk of bleeding, infection, cosmetic deformity, cardiovascular event, wound complications and the need for other treatments and procedures discussed.  No follow-ups on file.  Kennieth Francois, MD

## 2022-02-25 NOTE — Anesthesia Postprocedure Evaluation (Signed)
Anesthesia Post Note  Patient: Emilya Justen Eisel  Procedure(s) Performed: LEFT BREAST LUMPECTOMY (Left: Breast)     Patient location during evaluation: PACU Anesthesia Type: General Level of consciousness: awake and alert, oriented and patient cooperative Pain management: pain level controlled Vital Signs Assessment: post-procedure vital signs reviewed and stable Respiratory status: spontaneous breathing, nonlabored ventilation and respiratory function stable Cardiovascular status: blood pressure returned to baseline and stable Postop Assessment: no apparent nausea or vomiting Anesthetic complications: no   No notable events documented.  Last Vitals:  Vitals:   02/25/22 0827 02/25/22 1121  BP: (!) 167/90 124/76  Pulse: 78 65  Resp: 18 14  Temp: 36.4 C 36.7 C  SpO2: 97% 92%    Last Pain:  Vitals:   02/25/22 0839  TempSrc:   PainSc: 0-No pain                 Pervis Hocking

## 2022-02-25 NOTE — Interval H&P Note (Signed)
History and Physical Interval Note:  02/25/2022 9:34 AM  Bethany [name]  has presented today for surgery, with the diagnosis of LEFT BREAST SCAR MASS.  The various methods of treatment have been discussed with the patient and family. After consideration of risks, benefits and other options for treatment, the patient has consented to  Procedure(s): LEFT BREAST LUMPECTOMY (Left) as a surgical intervention.  The patient's history has been reviewed, patient examined, no change in status, stable for surgery.  I have reviewed the patient's chart and labs.  Questions were answered to the patient's satisfaction.   The procedure has been discussed with the patient. Alternatives to surgery have been discussed with the patient.  Risks of surgery include bleeding,  Infection,  Seroma formation, death,  and the need for further surgery.   The patient understands and wishes to proceed.   Turner Daniels MD

## 2022-02-25 NOTE — Op Note (Signed)
Preoperative diagnosis: Left breast mass upper outer quadrant with associated preoperative abnormality on ultrasound mammogram patient previous left breast lumpectomy scar with history of breast cancer  Postoperative diagnosis: Same  Procedure: Excision of left breast scar/lumpectomy left breast mass upper outer quadrant deep with associated skin of scar excised  Surgeon: Erroll Luna, MD   Manage 0.25% Marcaine with epinephrine  EBL: Minimal  Specimen: Left breast skin with scar as well as left breast mass associated with this.  Process was about 6 cm in maximal diameter.  Drains: None  Indications for procedure: The patient presents for a left breast.  She has a history of left breast lumpectomy and the mass corresponds to her left breast lumpectomy scar. Imaging showed focal asymmetry that was not able to be localized.  Of note when she had a previous core biopsy she had a syncopal episode with no language abnormality.  We discussed short-term follow-up as well as excision.  Patient her excision since her medications for breast cancer was an issue for her.  I discussed potential complications of bleeding, infection, cosmetic deformity, need for other treatment procedures on the implant and the fact that given her previous treatment she may have appropriate healing or other cosmetic issues with this.  Description of procedure: The patient was prepped and placed.  Left breast was marked as correct site.  The scar was noted.  She was taken back to the operative room.  She was placed supine on the OR table.  After induction of general anesthesia, left breast was prepped and draped in sterile fashion timeout performed.  Proper patient, site and procedure verified.  Scars identified left breast upper outer quadrant.  There is associated masslike wall changes to it as well extending from the left breast tissue toward the axilla.  Elliptical incision was made over the scar tissue.  There is a  large fibrous mass that appeared to be scar but this extended towards axilla and we had to extend the skin incision to encompass all this.  Entire mass was excised with grossly negative margins.  This was sent to pathology after orientation with ink.  Irrigation was used and stasis achieved with cautery.  Local anesthetic infiltrated.  Deep layers were approximated with 2-0 and 3 0 Vicryl.  4 Monocryl was used to close the skin in a subcuticular fashion.  Dermabond applied.  Breast binder placed.  All counts found to be correct.  The patient was awoke extubated taken to recovery in satisfactory condition.

## 2022-02-25 NOTE — Anesthesia Preprocedure Evaluation (Addendum)
Anesthesia Evaluation  Patient identified by MRN, date of birth, ID band Patient awake    Reviewed: Allergy & Precautions, H&P , NPO status , Patient's Chart, lab work & pertinent test results, reviewed documented beta blocker date and time   History of Anesthesia Complications (+) PONV and history of anesthetic complications (did fine w/ last surgery 2021- scop, zofran, decadron)  Airway Mallampati: III  TM Distance: >3 FB Neck ROM: Full    Dental  (+) Teeth Intact, Dental Advisory Given   Pulmonary COPD (usually only uses inhaler with exercise),  COPD inhaler, former smoker   Pulmonary exam normal breath sounds clear to auscultation       Cardiovascular hypertension (167/90 in preop, per pt normally 130-140SBP), Pt. on medications and Pt. on home beta blockers + CAD and + Cardiac Stents (DES to LAD 2021)  Normal cardiovascular exam+ Valvular Problems/Murmurs MVP  Rhythm:Regular Rate:Normal  Echo 11/2021  1. Left ventricular ejection fraction, by estimation, is 65 to 70%. The  left ventricle has normal function. Left ventricular endocardial border  not optimally defined to evaluate regional wall motion. Left ventricular  diastolic parameters were normal.   2. Right ventricular systolic function is normal. The right ventricular  size is normal. There is normal pulmonary artery systolic pressure. The  estimated right ventricular systolic pressure is 08.6 mmHg.   3. The mitral valve is grossly normal. Trivial mitral valve  regurgitation. No evidence of mitral stenosis.   4. The aortic valve is tricuspid. There is mild calcification of the  aortic valve. Aortic valve regurgitation is not visualized. No aortic  stenosis is present.   5. The inferior vena cava is normal in size with greater than 50%  respiratory variability, suggesting right atrial pressure of 3 mmHg.    Cath 2021  Mid LAD lesion is 80% stenosed.  A drug-eluting  stent was successfully placed using a STENT RESOLUTE ONYX 3.0X18.  Post intervention, there is a 0% residual stenosis.   1. Severe stenosis mid LAD 2. Successful PTCA/DES x 1 mid LAD   Recommendations: 3 months of DAPT with ASA and Plavix and then will stop for her breast surgery.     Neuro/Psych  PSYCHIATRIC DISORDERS Anxiety Depression    negative neurological ROS     GI/Hepatic Neg liver ROS, hiatal hernia,,,  Endo/Other  diabetes (prediabetic)Hypothyroidism    Renal/GU negative Renal ROS  negative genitourinary   Musculoskeletal  (+) Arthritis , Osteoarthritis,    Abdominal   Peds negative pediatric ROS (+)  Hematology negative hematology ROS (+) Hb 13.4, plt 303   Anesthesia Other Findings L breast scar mass  Reproductive/Obstetrics negative OB ROS                             Anesthesia Physical Anesthesia Plan  ASA: 3  Anesthesia Plan: General   Post-op Pain Management: Tylenol PO (pre-op)*   Induction: Intravenous  PONV Risk Score and Plan: 4 or greater and Ondansetron, Dexamethasone, Midazolam and Treatment may vary due to age or medical condition  Airway Management Planned: LMA  Additional Equipment:   Intra-op Plan:   Post-operative Plan: Extubation in OR  Informed Consent: I have reviewed the patients History and Physical, chart, labs and discussed the procedure including the risks, benefits and alternatives for the proposed anesthesia with the patient or authorized representative who has indicated his/her understanding and acceptance.     Dental advisory given  Plan Discussed with: CRNA  Anesthesia Plan Comments:        Anesthesia Quick Evaluation

## 2022-02-26 ENCOUNTER — Encounter (HOSPITAL_COMMUNITY): Payer: Self-pay | Admitting: Surgery

## 2022-03-01 NOTE — Progress Notes (Unsigned)
Office Visit    Patient Name: Bethany [name] Date of Encounter: 03/01/2022  PCP:  Leeroy Cha, [place]  Cardiologist:  [name], MD  Advanced Practice Provider:  No care team member to display Electrophysiologist:  None   HPI    Bethany [name] is a 61 y.o. female with a past medical history significant for CAD status post DES to LAD 06/2019, breast cancer 2019 status post radiation therapy, hiatal hernia, thyroid cancer, hypothyroidism, hypertension, vasovagal syncope, former tobacco abuse and anxiety presents today for follow-up appointment.  She was seen as a new patient by Dr. Angelena Form for evaluation of chest pain 05/15/2019.  She had an EGD per Dr. Henrene Pastor 04/12/2019 with no cause for chest pain found.  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  Echo 05/2019 showed normal LV systolic function, EF 66 5%, no valvular disease, mild asymmetric LVH, mildly elevated PASP (no evidence of MVP previously mentioned in [place]).  She underwent cardiac CTA 06/2019 which showed an LAD stenosis that appeared to be flow-limiting.  She previously had breast cancer in the left breast that was treated, with recurrence on the right around 2021.  She has been undergoing radiation therapy with plans lumpectomy.  Due to cardiac abnormalities, this was deferred.  She underwent LHC 06/2019 showing 80% LAD lesion, treated with PTCA/DES.  It was recommended that she would continue DAPT for 3 months with ASA plus Plavix and then stop for the breast surgery.  She was able to go on to have her surgery.  Office visit 10/2019 Dr. Angelena Form resume Plavix along with continuation of ASA.  Last OV 02/2021 indicated plan was to continue ASA plus statin but no longer on Plavix.  She had a syncope episode a week prior following urology procedure requiring nitric oxide and lidocaine.  She had woken up and saw a lot of blood that passed out.  Felt to be vasovagal.  Cardiac  work-up was reassuring.  In the interim, she was followed by Pharm.D. for lipid management/PCSK9 inhibitor.  She returned for follow-up 11/2021 and she was seen for recurrent syncope and near syncope.  She reported a longstanding history of vasovagal syncope for many years.  She usually gets a prodrome of feeling like she was going to pass out accompanied by a sense of racing heart rate.  No angina.  Today, she ***  Past Medical History    Past Medical History:  Diagnosis Date   ADD (attention deficit disorder)    Anxiety    B12 deficiency    Breast cancer ([place]) 2019   left breast cancer/diagnosed in 10/2017/taking radiation treatment until 03/15/18   Breast mass, left 11/2017   going thru radiation until 02/2018   CAD (coronary artery disease)    a. DES to LAD 06/2019.   Chest pain 06/19/2014   COPD (chronic obstructive pulmonary disease) (HCC)    beginning stages/small scar   Dental crowns present    Depression    Hiatal hernia    History of hiatal hernia    no current med.   History of thyroid cancer 12/15/2017   Hypertension    states under control with med., has been on med. x 1 yr.   Hypothyroidism    Malignant neoplasm of upper-outer quadrant of left breast in female, estrogen receptor positive ([place])    Mild hyperlipidemia    Mucocele of appendix 10/03/2015   MVP (mitral valve prolapse)    not  seen on echo 05/2019   Personal history of radiation therapy 2019   Left Breast Cancer   PONV (postoperative nausea and vomiting)    Pre-diabetes    Prediabetes    Radiation fibrosis of lung ([place])    Syncope and collapse 12/25/2008   Qualifier: Diagnosis of  By: Philemon Kingdom     Urinary incontinence    USI    Vasovagal syncope    Vitamin D deficiency    Past Surgical History:  Procedure Laterality Date   ABDOMINAL HYSTERECTOMY     partial   APPENDECTOMY     AXILLARY SENTINEL NODE BIOPSY Left 12/09/2017   Procedure: AXILLARY SENTINEL NODE BIOPSY;  Surgeon:  Erroll Luna, MD;  Location: [facility];  Service: General;  Laterality: Left;   BREAST EXCISIONAL BIOPSY Right 09/20/2019   BREAST LUMPECTOMY Left 11/2017   BREAST LUMPECTOMY Left 02/25/2022   Procedure: LEFT BREAST LUMPECTOMY;  Surgeon: Erroll Luna, MD;  Location: [facility];  Service: General;  Laterality: Left;   BREAST LUMPECTOMY WITH NEEDLE LOCALIZATION Left 11/25/2017   Procedure: LEFT BREAST LUMPECTOMY WITH NEEDLE LOCALIZATION;  Surgeon: Erroll Luna, MD;  Location: [facility];  Service: General;  Laterality: Left;   BREAST LUMPECTOMY WITH RADIOACTIVE SEED LOCALIZATION Right 09/20/2019   Procedure: RIGHT BREAST LUMPECTOMY WITH RADIOACTIVE SEED LOCALIZATION;  Surgeon: Erroll Luna, MD;  Location: [facility];  Service: General;  Laterality: Right;   CARDIAC CATHETERIZATION     COLONOSCOPY WITH PROPOFOL  10/03/2015   CORONARY STENT INTERVENTION N/A 02-Jul-2019   Procedure: CORONARY STENT INTERVENTION;  Surgeon: Burnell Blanks, MD;  Location: [facility];  Service: Cardiovascular;  Laterality: N/A;   LAPAROSCOPIC APPENDECTOMY N/A 10/03/2015   Procedure: APPENDECTOMY LAPAROSCOPIC;  Surgeon: Rolm Bookbinder, MD;  Location: [facility];  Service: General;  Laterality: N/A;   LEFT HEART CATH AND CORONARY ANGIOGRAPHY N/A 07/02/19   Procedure: LEFT HEART CATH AND CORONARY ANGIOGRAPHY;  Surgeon: Burnell Blanks, MD;  Location: [facility];  Service: Cardiovascular;  Laterality: N/A;   TOTAL THYROIDECTOMY  04/02/2003    Allergies  Allergies  Allergen Reactions   Oxycodone Nausea And Vomiting   Latex Rash   Other Rash    "Sutures caused severe rash."   Sulfa Antibiotics Hives     EKGs/Labs/Other Studies Reviewed:   The following studies were reviewed today: LHC 07-02-2019            Mid LAD lesion is 80% stenosed.            A drug-eluting stent was successfully placed using a STENT RESOLUTE ONYX  3.0X18.            Post intervention, there is a 0% residual stenosis.   1. Severe stenosis mid LAD 2. Successful PTCA/DES x 1 mid LAD   Recommendations: 3 months of DAPT with ASA and Plavix and then will stop for her breast surgery.    Echo 05/2019    1. Left ventricular ejection fraction, by estimation, is 60 to 65%. The  left ventricle has normal function. The left ventricle has no regional  wall motion abnormalities. There is mild asymmetric left ventricular  hypertrophy. Left ventricular diastolic  parameters were normal.   2. Right ventricular systolic function is normal. The right ventricular  size is normal. There is mildly elevated pulmonary artery systolic  pressure.   3. The mitral valve is normal in structure. No evidence of mitral valve  regurgitation. No evidence of  mitral stenosis.   4. The aortic valve is normal in structure. Aortic valve regurgitation is  not visualized. No aortic stenosis is present.   EKG:  EKG is *** ordered today.  The ekg ordered today demonstrates ***  Recent Labs: 06/26/2021: ALT 30 12/02/2021: Magnesium 2.4; TSH 0.010 02/25/2022: BUN 14; Creatinine, Ser 0.92; Hemoglobin 13.4; Platelets 303; Potassium 3.6; Sodium 138  Recent Lipid Panel    Component Value Date/Time   CHOL 128 06/30/2021 0955   TRIG 54 06/30/2021 0955   HDL 83 06/30/2021 0955   CHOLHDL 1.5 06/30/2021 0955   CHOLHDL 3.7 06/19/2014 2234   VLDL 18 06/19/2014 2234   LDLCALC 33 06/30/2021 0955    Risk Assessment/Calculations:  {Does this patient have ATRIAL FIBRILLATION?:[id number]}  Home Medications   No outpatient medications have been marked as taking for the 03/02/22 encounter (Appointment) with Elgie Collard, PA-C.     Review of Systems   ***   All other systems reviewed and are otherwise negative except as noted above.  Physical Exam    VS:  There were no vitals taken for this visit. , BMI There is no height or weight on file to calculate BMI.  Wt  Readings from Last 3 Encounters:  02/25/22 195 lb (88.5 kg)  01/30/22 201 lb 9.6 oz (91.4 kg)  12/02/21 199 lb (90.3 kg)     GEN: Well nourished, well developed, in no acute distress. HEENT: normal. Neck: Supple, no JVD, carotid bruits, or masses. Cardiac: ***RRR, no murmurs, rubs, or gallops. No clubbing, cyanosis, edema.  ***Radials/PT 2+ and equal bilaterally.  Respiratory:  ***Respirations regular and unlabored, clear to auscultation bilaterally. GI: Soft, nontender, nondistended. MS: No deformity or atrophy. Skin: Warm and dry, no rash. Neuro:  Strength and sensation are intact. Psych: Normal affect.  Assessment & Plan    Recurrent syncope CAD Hyperlipidemia Essential hypertension  No BP recorded.  {Refresh Note OR Click here to enter BP  :1}***      Disposition: Follow up {follow up:15908} with [name], MD or APP.  Signed, Elgie Collard, PA-C 03/01/2022, 3:45 PM [facility]

## 2022-03-02 ENCOUNTER — Ambulatory Visit: Payer: Commercial Managed Care - HMO | Admitting: Physician Assistant

## 2022-03-02 DIAGNOSIS — R55 Syncope and collapse: Secondary | ICD-10-CM

## 2022-03-02 DIAGNOSIS — I200 Unstable angina: Secondary | ICD-10-CM

## 2022-03-02 DIAGNOSIS — I10 Essential (primary) hypertension: Secondary | ICD-10-CM

## 2022-03-02 DIAGNOSIS — E785 Hyperlipidemia, unspecified: Secondary | ICD-10-CM

## 2022-03-02 DIAGNOSIS — I2510 Atherosclerotic heart disease of native coronary artery without angina pectoris: Secondary | ICD-10-CM

## 2022-03-02 LAB — SURGICAL PATHOLOGY

## 2022-03-03 ENCOUNTER — Encounter: Payer: Self-pay | Admitting: Surgery

## 2022-03-03 NOTE — Progress Notes (Deleted)
TeleHealth Visit:  This visit was completed with telemedicine (audio/video) technology. Bethany [name] has verbally consented to this TeleHealth visit. The patient is located at home, the provider is located at home. The participants in this visit include the listed provider and patient. The visit was conducted today via MyChart video.  OBESITY Bethany [name] is here to discuss her progress with her obesity treatment plan along with follow-up of her obesity related diagnoses.   Today's visit was # 14 Starting weight: 203 lbs Starting date: 05/22/2021 Weight at last in office visit: 202 lbs on 01/07/22 Total weight loss: 1 lbs at last in office visit on 01/07/22. Today's reported weight: *** lbs No weight reported.  Nutrition Plan: following a lower carbohydrate, vegetable and lean protein rich diet plan.   Current exercise: {exercise types:16438} none  Interim History:  ***  Assessment/Plan:  1. ***  2. ***  3. ***  Obesity: Current BMI *** Bethany [name] {CHL AMB IS/IS NOT:210130109} currently in the action stage of change. As such, her goal is to {MWMwtloss#1:210800005}.  She has agreed to {MWMwtlossportion/plan2:23431}.   Exercise goals: {MWM EXERCISE RECS:23473}  Behavioral modification strategies: {MWMwtlossdietstrategies3:23432}.  Bethany [name] has agreed to follow-up with our clinic in {NUMBER 1-10:22536} weeks.   No orders of the defined types were placed in this encounter.   There are no discontinued medications.   No orders of the defined types were placed in this encounter.     Objective:   VITALS: Per patient if applicable, see vitals. GENERAL: Alert and in no acute distress. CARDIOPULMONARY: No increased WOB. Speaking in clear sentences.  PSYCH: Pleasant and cooperative. Speech normal rate and rhythm. Affect is appropriate. Insight and judgement are appropriate. Attention is focused, linear, and appropriate.  NEURO: Oriented as arrived to appointment on time with no  prompting.   Lab Results  Component Value Date   CREATININE 0.92 02/25/2022   BUN 14 02/25/2022   NA 138 02/25/2022   K 3.6 02/25/2022   CL 106 02/25/2022   CO2 23 02/25/2022   Lab Results  Component Value Date   ALT 30 06/26/2021   AST 26 06/26/2021   ALKPHOS 72 06/26/2021   BILITOT 0.4 06/26/2021   Lab Results  Component Value Date   HGBA1C 5.7 (H) 05/22/2021   HGBA1C 5.7 (H) 02/28/2020   HGBA1C 5.7 (H) 10/16/2019   HGBA1C 5.7 (H) 06/19/2014   Lab Results  Component Value Date   INSULIN 15.5 05/22/2021   INSULIN 10.5 02/28/2020   INSULIN 11.1 10/16/2019   Lab Results  Component Value Date   TSH 0.010 (L) 12/02/2021   Lab Results  Component Value Date   CHOL 128 06/30/2021   HDL 83 06/30/2021   LDLCALC 33 06/30/2021   TRIG 54 06/30/2021   CHOLHDL 1.5 06/30/2021   Lab Results  Component Value Date   WBC 6.9 02/25/2022   HGB 13.4 02/25/2022   HCT 40.8 02/25/2022   MCV 89.9 02/25/2022   PLT 303 02/25/2022   No results found for: "IRON", "TIBC", "FERRITIN" Lab Results  Component Value Date   VD25OH 32.7 02/28/2020   VD25OH 28.1 (L) 10/16/2019    Attestation Statements:   Reviewed by clinician on day of visit: allergies, medications, problem list, medical history, surgical history, family history, social history, and previous encounter notes.  ***(delete if time-based billing not used) Time spent on visit including the items listed below was *** minutes.  -preparing to see the patient (e.g., review of tests, history, previous notes) -obtaining and/or reviewing separately  obtained history -counseling and educating the patient/family/caregiver -documenting clinical information in the electronic or other health record -ordering medications, tests, or procedures -independently interpreting results and communicating results to the patient/ family/caregiver -referring and communicating with other health care professionals  -care coordination

## 2022-03-04 ENCOUNTER — Telehealth (INDEPENDENT_AMBULATORY_CARE_PROVIDER_SITE_OTHER): Payer: Commercial Managed Care - HMO | Admitting: Family Medicine

## 2022-03-05 ENCOUNTER — Other Ambulatory Visit: Payer: Self-pay | Admitting: Physician Assistant

## 2022-03-05 ENCOUNTER — Telehealth: Payer: Commercial Managed Care - HMO | Admitting: Hematology and Oncology

## 2022-03-09 NOTE — Progress Notes (Deleted)
TeleHealth Visit:  This visit was completed with telemedicine (audio/video) technology. Bethany [name] to this TeleHealth visit. The patient is located at home, the provider is located at home. The participants in this visit include the listed provider and patient. The visit was conducted today via MyChart video.  Bethany [name] is here to discuss her progress with her Bethany treatment plan along with follow-up of her Bethany related diagnoses.   Today's visit was # 14 Starting weight: 203 lbs Starting date: 05/22/2021 Weight at last in office visit: 202 lbs on 01/07/22 Total weight loss: 1 lbs at last in office visit on 01/07/22. Weight reported last virtual visit on 02/03/2022: 198 pounds Today's reported weight: *** lbs No weight reported.   Nutrition Plan: following a lower carbohydrate, vegetable and lean protein rich diet plan.    Current exercise: none   Interim History:  ***  Assessment/Plan:  1. ***  2. ***  3. ***  Bethany: Current BMI *** Bethany [name] {CHL AMB IS/IS NOT:210130109} currently in the action stage of change. As such, her goal is to {MWMwtloss#1:210800005}.  She has agreed to {MWMwtlossportion/plan2:23431}.   Exercise goals: {MWM EXERCISE RECS:23473}  Behavioral modification strategies: {MWMwtlossdietstrategies3:23432}.  Bethany [name] has agreed to follow-up with our clinic in {NUMBER 1-10:22536} weeks.   No orders of the defined types were placed in this encounter.   There are no discontinued medications.   No orders of the defined types were placed in this encounter.     Objective:   VITALS: Per patient if applicable, see vitals. GENERAL: Alert and in no acute distress. CARDIOPULMONARY: No increased WOB. Speaking in clear sentences.  PSYCH: Pleasant and cooperative. Speech normal rate and rhythm. Affect is appropriate. Insight and judgement are appropriate. Attention is focused, linear, and appropriate.  NEURO: Oriented as  arrived to appointment on time with no prompting.   Lab Results  Component Value Date   CREATININE 0.92 02/25/2022   BUN 14 02/25/2022   NA 138 02/25/2022   K 3.6 02/25/2022   CL 106 02/25/2022   CO2 23 02/25/2022   Lab Results  Component Value Date   ALT 30 06/26/2021   AST 26 06/26/2021   ALKPHOS 72 06/26/2021   BILITOT 0.4 06/26/2021   Lab Results  Component Value Date   HGBA1C 5.7 (H) 05/22/2021   HGBA1C 5.7 (H) 02/28/2020   HGBA1C 5.7 (H) 10/16/2019   HGBA1C 5.7 (H) 06/19/2014   Lab Results  Component Value Date   INSULIN 15.5 05/22/2021   INSULIN 10.5 02/28/2020   INSULIN 11.1 10/16/2019   Lab Results  Component Value Date   TSH 0.010 (L) 12/02/2021   Lab Results  Component Value Date   CHOL 128 06/30/2021   HDL 83 06/30/2021   LDLCALC 33 06/30/2021   TRIG 54 06/30/2021   CHOLHDL 1.5 06/30/2021   Lab Results  Component Value Date   WBC 6.9 02/25/2022   HGB 13.4 02/25/2022   HCT 40.8 02/25/2022   MCV 89.9 02/25/2022   PLT 303 02/25/2022   No results found for: "IRON", "TIBC", "FERRITIN" Lab Results  Component Value Date   VD25OH 32.7 02/28/2020   VD25OH 28.1 (L) 10/16/2019    Attestation Statements:   Reviewed by clinician on day of visit: allergies, medications, problem list, medical history, surgical history, family history, social history, and previous encounter notes.  ***(delete if time-based billing not used) Time spent on visit including the items listed below was *** minutes.  -preparing to see the patient (e.g.,  review of tests, history, previous notes) -obtaining and/or reviewing separately obtained history -counseling and educating the patient/family/caregiver -documenting clinical information in the electronic or other health record -ordering medications, tests, or procedures -independently interpreting results and communicating results to the patient/ family/caregiver -referring and communicating with other health care  professionals  -care coordination

## 2022-03-10 ENCOUNTER — Telehealth (INDEPENDENT_AMBULATORY_CARE_PROVIDER_SITE_OTHER): Payer: Commercial Managed Care - HMO | Admitting: Family Medicine

## 2022-03-10 ENCOUNTER — Telehealth (INDEPENDENT_AMBULATORY_CARE_PROVIDER_SITE_OTHER): Payer: Self-pay | Admitting: Family Medicine

## 2022-03-10 NOTE — Telephone Encounter (Signed)
Patient was a no-show for virtual visit. Left VM advising patient to call to reschedule.  

## 2022-03-17 NOTE — Progress Notes (Unsigned)
Office Visit    Patient Name: Bethany [name] Date of Encounter: 03/17/2022  PCP:  Leeroy Cha, [place]  Cardiologist:  [name], MD  Advanced Practice Provider:  No care team member to display Electrophysiologist:  None   HPI    Bethany [name] is a 62 y.o. female with a past medical history significant for CAD status post DES to LAD 06/2019, breast cancer 2019 status post radiation therapy, hiatal hernia, thyroid cancer, hypothyroidism, hypertension, vasovagal syncope, former tobacco abuse and anxiety presents today for follow-up appointment.  She was seen as a new patient by Dr. Angelena Form for evaluation of chest pain 05/15/2019.  She had an EGD per Dr. Henrene Pastor 04/12/2019 with no cause for chest pain found.  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  Echo 05/2019 showed normal LV systolic function, EF 66 5%, no valvular disease, mild asymmetric LVH, mildly elevated PASP (no evidence of MVP previously mentioned in [place]).  She underwent cardiac CTA 06/2019 which showed an LAD stenosis that appeared to be flow-limiting.  She previously had breast cancer in the left breast that was treated, with recurrence on the right around 2021.  She has been undergoing radiation therapy with plans lumpectomy.  Due to cardiac abnormalities, this was deferred.  She underwent LHC 06/2019 showing 80% LAD lesion, treated with PTCA/DES.  It was recommended that she would continue DAPT for 3 months with ASA plus Plavix and then stop for the breast surgery.  She was able to go on to have her surgery.  Office visit 10/2019 Dr. Angelena Form resume Plavix along with continuation of ASA.  Last OV 02/2021 indicated plan was to continue ASA plus statin but no longer on Plavix.  She had a syncope episode a week prior following urology procedure requiring nitric oxide and lidocaine.  She had woken up and saw a lot of blood that passed out.  Felt to be vasovagal.  Cardiac  work-up was reassuring.  In the interim, she was followed by Pharm.D. for lipid management/PCSK9 inhibitor.  She returned for follow-up 11/2021 and she was seen for recurrent syncope and near syncope.  She reported a longstanding history of vasovagal syncope for many years.  She usually gets a prodrome of feeling like she was going to pass out accompanied by a sense of racing heart rate.  No angina.  Today, she ***  Past Medical History    Past Medical History:  Diagnosis Date   ADD (attention deficit disorder)    Anxiety    B12 deficiency    Breast cancer ([place]) 2019   left breast cancer/diagnosed in 10/2017/taking radiation treatment until 03/15/18   Breast mass, left 11/2017   going thru radiation until 02/2018   CAD (coronary artery disease)    a. DES to LAD 06/2019.   Chest pain 06/19/2014   COPD (chronic obstructive pulmonary disease) (HCC)    beginning stages/small scar   Dental crowns present    Depression    Hiatal hernia    History of hiatal hernia    no current med.   History of thyroid cancer 12/15/2017   Hypertension    states under control with med., has been on med. x 1 yr.   Hypothyroidism    Malignant neoplasm of upper-outer quadrant of left breast in female, estrogen receptor positive ([place])    Mild hyperlipidemia    Mucocele of appendix 10/03/2015   MVP (mitral valve prolapse)    not  seen on echo 05/2019   Personal history of radiation therapy 2019   Left Breast Cancer   PONV (postoperative nausea and vomiting)    Pre-diabetes    Prediabetes    Radiation fibrosis of lung ([place])    Syncope and collapse 12/25/2008   Qualifier: Diagnosis of  By: Philemon Kingdom     Urinary incontinence    USI    Vasovagal syncope    Vitamin D deficiency    Past Surgical History:  Procedure Laterality Date   ABDOMINAL HYSTERECTOMY     partial   APPENDECTOMY     AXILLARY SENTINEL NODE BIOPSY Left 12/09/2017   Procedure: AXILLARY SENTINEL NODE BIOPSY;  Surgeon:  Erroll Luna, MD;  Location: [facility];  Service: General;  Laterality: Left;   BREAST EXCISIONAL BIOPSY Right 09/20/2019   BREAST LUMPECTOMY Left 11/2017   BREAST LUMPECTOMY Left 02/25/2022   Procedure: LEFT BREAST LUMPECTOMY;  Surgeon: Erroll Luna, MD;  Location: [facility];  Service: General;  Laterality: Left;   BREAST LUMPECTOMY WITH NEEDLE LOCALIZATION Left 11/25/2017   Procedure: LEFT BREAST LUMPECTOMY WITH NEEDLE LOCALIZATION;  Surgeon: Erroll Luna, MD;  Location: [facility];  Service: General;  Laterality: Left;   BREAST LUMPECTOMY WITH RADIOACTIVE SEED LOCALIZATION Right 09/20/2019   Procedure: RIGHT BREAST LUMPECTOMY WITH RADIOACTIVE SEED LOCALIZATION;  Surgeon: Erroll Luna, MD;  Location: [facility];  Service: General;  Laterality: Right;   CARDIAC CATHETERIZATION     COLONOSCOPY WITH PROPOFOL  10/03/2015   CORONARY STENT INTERVENTION N/A 07-29-2019   Procedure: CORONARY STENT INTERVENTION;  Surgeon: Burnell Blanks, MD;  Location: [facility];  Service: Cardiovascular;  Laterality: N/A;   LAPAROSCOPIC APPENDECTOMY N/A 10/03/2015   Procedure: APPENDECTOMY LAPAROSCOPIC;  Surgeon: Rolm Bookbinder, MD;  Location: [facility];  Service: General;  Laterality: N/A;   LEFT HEART CATH AND CORONARY ANGIOGRAPHY N/A Jul 29, 2019   Procedure: LEFT HEART CATH AND CORONARY ANGIOGRAPHY;  Surgeon: Burnell Blanks, MD;  Location: [facility];  Service: Cardiovascular;  Laterality: N/A;   TOTAL THYROIDECTOMY  04/02/2003    Allergies  Allergies  Allergen Reactions   Oxycodone Nausea And Vomiting   Latex Rash   Other Rash    "Sutures caused severe rash."   Sulfa Antibiotics Hives     EKGs/Labs/Other Studies Reviewed:   The following studies were reviewed today: LHC Jul 29, 2019            Mid LAD lesion is 80% stenosed.            A drug-eluting stent was successfully placed using a STENT RESOLUTE ONYX  3.0X18.            Post intervention, there is a 0% residual stenosis.   1. Severe stenosis mid LAD 2. Successful PTCA/DES x 1 mid LAD   Recommendations: 3 months of DAPT with ASA and Plavix and then will stop for her breast surgery.    Echo 05/2019    1. Left ventricular ejection fraction, by estimation, is 60 to 65%. The  left ventricle has normal function. The left ventricle has no regional  wall motion abnormalities. There is mild asymmetric left ventricular  hypertrophy. Left ventricular diastolic  parameters were normal.   2. Right ventricular systolic function is normal. The right ventricular  size is normal. There is mildly elevated pulmonary artery systolic  pressure.   3. The mitral valve is normal in structure. No evidence of mitral valve  regurgitation. No evidence of  mitral stenosis.   4. The aortic valve is normal in structure. Aortic valve regurgitation is  not visualized. No aortic stenosis is present.   EKG:  EKG is *** ordered today.  The ekg ordered today demonstrates ***  Recent Labs: 06/26/2021: ALT 30 12/02/2021: Magnesium 2.4; TSH 0.010 02/25/2022: BUN 14; Creatinine, Ser 0.92; Hemoglobin 13.4; Platelets 303; Potassium 3.6; Sodium 138  Recent Lipid Panel    Component Value Date/Time   CHOL 128 06/30/2021 0955   TRIG 54 06/30/2021 0955   HDL 83 06/30/2021 0955   CHOLHDL 1.5 06/30/2021 0955   CHOLHDL 3.7 06/19/2014 2234   VLDL 18 06/19/2014 2234   LDLCALC 33 06/30/2021 0955    Risk Assessment/Calculations:  {Does this patient have ATRIAL FIBRILLATION?:[phone]}  Home Medications   No outpatient medications have been marked as taking for the 03/19/22 encounter (Appointment) with Elgie Collard, PA-C.     Review of Systems   ***   All other systems reviewed and are otherwise negative except as noted above.  Physical Exam    VS:  There were no vitals taken for this visit. , BMI There is no height or weight on file to calculate BMI.  Wt  Readings from Last 3 Encounters:  02/25/22 195 lb (88.5 kg)  01/30/22 201 lb 9.6 oz (91.4 kg)  12/02/21 199 lb (90.3 kg)     GEN: Well nourished, well developed, in no acute distress. HEENT: normal. Neck: Supple, no JVD, carotid bruits, or masses. Cardiac: ***RRR, no murmurs, rubs, or gallops. No clubbing, cyanosis, edema.  ***Radials/PT 2+ and equal bilaterally.  Respiratory:  ***Respirations regular and unlabored, clear to auscultation bilaterally. GI: Soft, nontender, nondistended. MS: No deformity or atrophy. Skin: Warm and dry, no rash. Neuro:  Strength and sensation are intact. Psych: Normal affect.  Assessment & Plan    Recurrent syncope CAD Hyperlipidemia Essential hypertension  No BP recorded.  {Refresh Note OR Click here to enter BP  :1}***      Disposition: Follow up {follow up:15908} with [name], MD or APP.  Signed, Elgie Collard, PA-C 03/17/2022, 9:19 AM [place]

## 2022-03-19 ENCOUNTER — Ambulatory Visit: Payer: Commercial Managed Care - HMO | Admitting: Physician Assistant

## 2022-04-07 ENCOUNTER — Ambulatory Visit: Payer: Commercial Managed Care - HMO | Admitting: Physician Assistant

## 2022-04-21 ENCOUNTER — Ambulatory Visit: Payer: Commercial Managed Care - HMO | Admitting: Physician Assistant

## 2022-05-11 ENCOUNTER — Telehealth: Payer: Self-pay | Admitting: Hematology and Oncology

## 2022-05-11 NOTE — Telephone Encounter (Signed)
Reached out to patient to reschedule, patient aware of date and time of appointment.

## 2022-07-31 ENCOUNTER — Ambulatory Visit: Payer: Commercial Managed Care - HMO | Admitting: Hematology and Oncology

## 2022-07-31 ENCOUNTER — Encounter: Payer: Self-pay | Admitting: Hematology and Oncology

## 2022-08-04 ENCOUNTER — Inpatient Hospital Stay: Payer: Commercial Managed Care - HMO | Admitting: Hematology and Oncology

## 2022-08-10 ENCOUNTER — Other Ambulatory Visit (HOSPITAL_COMMUNITY): Payer: Self-pay

## 2022-09-28 ENCOUNTER — Other Ambulatory Visit: Payer: Self-pay | Admitting: *Deleted

## 2022-09-28 MED ORDER — VENLAFAXINE HCL ER 150 MG PO CP24: ORAL_CAPSULE | ORAL | 0 refills | Status: DC

## 2022-12-16 DIAGNOSIS — Z0289 Encounter for other administrative examinations: Secondary | ICD-10-CM

## 2022-12-21 ENCOUNTER — Ambulatory Visit (INDEPENDENT_AMBULATORY_CARE_PROVIDER_SITE_OTHER): Payer: Self-pay | Admitting: Physician Assistant

## 2022-12-21 NOTE — Progress Notes (Deleted)
.smr  Office: [phone]  /  Fax: [phone]  WEIGHT SUMMARY AND BIOMETRICS  No data recorded No data recorded No data recorded No data recorded   HPI  Chief Complaint: OBESITY  Bethany [name] is here to discuss her progress with her obesity treatment plan. Bethany [name] is on the {MWMwtlossportion/plan2:23431} and states Bethany [name] is following her eating plan approximately *** % of the time. Bethany [name] states Bethany [name] is exercising *** minutes *** times per week.   Interval History:  Last in office visit . Last video visit with Adah Salvage, FNP 02/03/2022. In the interim, Bethany [name] was diagnosed with breast cancer and CAD. Bethany [name] also has prediabetes, HTN Since last office visit Bethany [name] *** Hunger/appetite-*** Cravings- *** Stress- *** Sleep- *** Exercise-*** Hydration-***   Pharmacotherapy: ***  TREATMENT PLAN FOR OBESITY:  Recommended Dietary Goals  Bethany [name] is currently in the action stage of change. As such, her goal is to continue weight management plan. Bethany [name] has agreed to {MWMwtlossportion/plan2:23431}.  Behavioral Intervention  We discussed the following Behavioral Modification Strategies today: {EMWMwtlossstrategies:28914::"continue to work on maintaining a reduced calorie state, getting the recommended amount of protein, incorporating whole foods, making healthy choices, staying well hydrated and practicing mindfulness when eating."}.  Additional resources provided today: NA  Recommended Physical Activity Goals  Bethany [name] has been advised to work up to 150 minutes of moderate intensity aerobic activity a week and strengthening exercises 2-3 times per week for cardiovascular health, weight loss maintenance and preservation of muscle mass.   Bethany [name] has agreed to {EMEXERCISE:28847::"Think about enjoyable ways to increase daily physical activity and overcoming barriers to exercise","Increase physical activity in their day and reduce sedentary time (increase NEAT)."}   Pharmacotherapy We discussed various  medication options to help Bethany [name] with her weight loss efforts and we both agreed to ***.    No follow-ups on file.Marland Kitchen Bethany [name] was informed of the importance of frequent follow up visits to maximize her success with intensive lifestyle modifications for her multiple health conditions.  PHYSICAL EXAM:  There were no vitals taken for this visit. There is no height or weight on file to calculate BMI.  General: Bethany [name] is overweight, cooperative, alert, well developed, and in no acute distress. PSYCH: Has normal mood, affect and thought process.   Lungs: Normal breathing effort, no conversational dyspnea.  DIAGNOSTIC DATA REVIEWED:  BMET    Component Value Date/Time   NA 138 02/25/2022 0901   NA 144 12/02/2021 1525   K 3.6 02/25/2022 0901   CL 106 02/25/2022 0901   CO2 23 02/25/2022 0901   GLUCOSE 97 02/25/2022 0901   BUN 14 02/25/2022 0901   BUN 15 12/02/2021 1525   CREATININE 0.92 02/25/2022 0901   CREATININE 0.86 06/26/2021 1514   CALCIUM 9.0 02/25/2022 0901   GFRNONAA >60 02/25/2022 0901   GFRNONAA >60 06/26/2021 1514   GFRAA >60 10/12/2019 0812   GFRAA >60 12/16/2017 1540   Lab Results  Component Value Date   HGBA1C 5.7 (H) 05/22/2021   HGBA1C 5.7 (H) 06/19/2014   Lab Results  Component Value Date   INSULIN 15.5 05/22/2021   INSULIN 11.1 10/16/2019   Lab Results  Component Value Date   TSH 0.010 (L) 12/02/2021   CBC    Component Value Date/Time   WBC 6.9 02/25/2022 0901   RBC 4.54 02/25/2022 0901   HGB 13.4 02/25/2022 0901   HGB 14.3 12/02/2021 1525   HCT 40.8 02/25/2022 0901   HCT 43.1 12/02/2021 1525   PLT 303 02/25/2022 0901   PLT 349  12/02/2021 1525   MCV 89.9 02/25/2022 0901   MCV 87 12/02/2021 1525   MCH 29.5 02/25/2022 0901   MCHC 32.8 02/25/2022 0901   RDW 13.2 02/25/2022 0901   RDW 13.0 12/02/2021 1525   Iron Studies No results found for: "IRON", "TIBC", "FERRITIN", "IRONPCTSAT" Lipid Panel     Component Value Date/Time   CHOL 128 06/30/2021  0955   TRIG 54 06/30/2021 0955   HDL 83 06/30/2021 0955   CHOLHDL 1.5 06/30/2021 0955   CHOLHDL 3.7 06/19/2014 2234   VLDL 18 06/19/2014 2234   LDLCALC 33 06/30/2021 0955   Hepatic Function Panel     Component Value Date/Time   PROT 7.0 06/26/2021 1514   PROT 6.8 05/22/2021 1041   ALBUMIN 4.0 06/26/2021 1514   ALBUMIN 4.6 05/22/2021 1041   AST 26 06/26/2021 1514   ALT 30 06/26/2021 1514   ALKPHOS 72 06/26/2021 1514   BILITOT 0.4 06/26/2021 1514   BILIDIR <0.10 02/21/2021 0854   IBILI 0.3 04/03/2019 0825      Component Value Date/Time   TSH 0.010 (L) 12/02/2021 1525   Nutritional Lab Results  Component Value Date   VD25OH 32.7 02/28/2020   VD25OH 28.1 (L) 10/16/2019    ASSOCIATED CONDITIONS ADDRESSED TODAY  ASSESSMENT AND PLAN  Problem List Items Addressed This Visit     Vitamin D deficiency   Prediabetes - Primary   Essential hypertension   Other Visit Diagnoses     Polyphagia           ATTESTASTION STATEMENTS:  Reviewed by clinician on day of visit: allergies, medications, problem list, medical history, surgical history, family history, social history, and previous encounter notes.   I have personally spent 30 minutes total time today in preparation, patient care, nutritional counseling and documentation for this visit, including the following: review of clinical lab tests; review of medical tests/procedures/services.      [name], PA-C

## 2023-01-15 ENCOUNTER — Other Ambulatory Visit: Payer: Self-pay | Admitting: *Deleted

## 2023-01-15 MED ORDER — VENLAFAXINE HCL ER 150 MG PO CP24: ORAL_CAPSULE | ORAL | 0 refills | Status: DC

## 2023-01-15 NOTE — Telephone Encounter (Signed)
Note per RF request need for pt to reschedule canceled appt by her from May 2024.  Refill sent with request for pt to reschedule as well as in basket appt request sent by this RN

## 2023-01-16 ENCOUNTER — Telehealth: Payer: Self-pay | Admitting: Hematology and Oncology

## 2023-01-16 NOTE — Telephone Encounter (Signed)
Vm left with patient to schedule f/u with Dr. Al Pimple per IB.

## 2023-01-16 NOTE — Telephone Encounter (Signed)
Spoke with patient confirming upcoming appointment  

## 2023-01-18 ENCOUNTER — Other Ambulatory Visit: Payer: Self-pay

## 2023-01-18 ENCOUNTER — Encounter: Payer: Self-pay | Admitting: Cardiovascular Disease

## 2023-01-18 ENCOUNTER — Emergency Department (HOSPITAL_COMMUNITY)
Admission: EM | Admit: 2023-01-18 | Discharge: 2023-01-19 | Disposition: A | Discharge status: Home / Self Care | Payer: 59

## 2023-01-18 ENCOUNTER — Emergency Department (HOSPITAL_COMMUNITY): Payer: 59

## 2023-01-18 ENCOUNTER — Encounter (HOSPITAL_COMMUNITY): Payer: Self-pay | Admitting: Emergency Medicine

## 2023-01-18 DIAGNOSIS — I10 Essential (primary) hypertension: Secondary | ICD-10-CM | POA: Insufficient documentation

## 2023-01-18 DIAGNOSIS — Z91040 Latex allergy status: Secondary | ICD-10-CM | POA: Insufficient documentation

## 2023-01-18 DIAGNOSIS — Z85850 Personal history of malignant neoplasm of thyroid: Secondary | ICD-10-CM | POA: Diagnosis not present

## 2023-01-18 DIAGNOSIS — R079 Chest pain, unspecified: Secondary | ICD-10-CM | POA: Diagnosis present

## 2023-01-18 DIAGNOSIS — Z853 Personal history of malignant neoplasm of breast: Secondary | ICD-10-CM | POA: Insufficient documentation

## 2023-01-18 DIAGNOSIS — Z79899 Other long term (current) drug therapy: Secondary | ICD-10-CM | POA: Diagnosis not present

## 2023-01-18 LAB — CBC
HCT: 41.8 % (ref 36.0–46.0)
Hemoglobin: 13.3 g/dL (ref 12.0–15.0)
MCH: 29.4 pg (ref 26.0–34.0)
MCHC: 31.8 g/dL (ref 30.0–36.0)
MCV: 92.5 fL (ref 80.0–100.0)
Platelets: 313 10*3/uL (ref 150–400)
RBC: 4.52 MIL/uL (ref 3.87–5.11)
RDW: 13.1 % (ref 11.5–15.5)
WBC: 6.7 10*3/uL (ref 4.0–10.5)
nRBC: 0 % (ref 0.0–0.2)

## 2023-01-18 LAB — BASIC METABOLIC PANEL
Anion gap: 10 (ref 5–15)
BUN: 8 mg/dL (ref 6–20)
CO2: 23 mmol/L (ref 22–32)
Calcium: 9.1 mg/dL (ref 8.9–10.3)
Chloride: 105 mmol/L (ref 98–111)
Creatinine, Ser: 0.78 mg/dL (ref 0.44–1.00)
GFR, Estimated: >60 mL/min (ref >60)
Glucose, Bld: 97 mg/dL (ref 70–99)
Potassium: 4.1 mmol/L (ref 3.5–5.1)
Sodium: 138 mmol/L (ref 135–145)

## 2023-01-18 LAB — TROPONIN I (HIGH SENSITIVITY)
Troponin I (High Sensitivity): 4 ng/L (ref <18)
Troponin I (High Sensitivity): 4 ng/L (ref <18)

## 2023-01-18 NOTE — Telephone Encounter (Signed)
Called and spoke with the patient.   She is laying in bed because she doesn't feel good and doesn't want the symptoms to return.  She is going to come in to the hospital.  Her husband will drive her or she will call EMS.   Will get up and dressed and come in now.   Informed Biomedical scientist.

## 2023-01-18 NOTE — ED Provider Notes (Signed)
[facility] EMERGENCY DEPARTMENT AT [facility] Provider Note   CSN: [id number] Arrival date & time: 01/18/23  1627     History  Chief Complaint  Patient presents with   Chest Pain    Bethany [name] is a 62 y.o. female with past medical history significant for hypertension, hyperlipidemia, anxiety, prediabetes, history of thyroid cancer, history of breast cancer who presents concern for burning chest pain, feeling of indigestion that began around 4 to 5 days ago.  Patient reports that the pain comes up whether sitting, standing, or walking, seemingly random, not associated with shortness of breath, is associated with feeling of burning, burping.  No relief with GERD medication, does relief with nitroglycerin.  Patient reports that she had 80% blockage of LAD a few years ago and presentation was exactly the same.  She does have an appointment to follow-up with her cardiologist next week.   Chest Pain      Home Medications Prior to Admission medications   Medication Sig Start Date End Date Taking? Authorizing Provider  albuterol (VENTOLIN HFA) 108 (90 Base) MCG/ACT inhaler Inhale 2 puffs into the lungs every 6 (six) hours as needed for wheezing or shortness of breath. 11/24/18   Icard, Rachel Bo, DO  cetirizine (ZYRTEC) 10 MG tablet Take 10 mg by mouth daily.    [provider]  Cyanocobalamin (B-12 COMPLIANCE INJECTION IJ) Inject 1 Dose as directed every 30 (thirty) days.    [provider]  Dulaglutide (TRULICITY) 3 MG/0.5ML SOPN Inject 3 mg as directed once a week. 02/03/22   Whitmire, Dawn, FNP  Evolocumab (REPATHA SURECLICK) 140 MG/ML SOAJ Inject 1 Pen into the skin every 14 (fourteen) days. 09/09/21   Kathleene Hazel, MD  HYDROcodone-acetaminophen (NORCO) 10-325 MG tablet Take 1 tablet by mouth every 6 (six) hours as needed. 02/25/22 02/25/23  Harriette Bouillon, MD  ibuprofen (ADVIL) 800 MG tablet Take 1 tablet (800 mg total) by mouth every 8  (eight) hours as needed. 02/25/22   Cornett, Maisie Fus, MD  levothyroxine (SYNTHROID) 112 MCG tablet Take 112 mcg by mouth daily. 02/13/22   [provider]  losartan (COZAAR) 100 MG tablet TAKE 1 TABLET BY MOUTH EVERY DAY 03/05/22   Kathleene Hazel, MD  metoprolol succinate (TOPROL XL) 25 MG 24 hr tablet Take 1 tablet (25 mg total) by mouth daily. 01/09/22   Dunn, Tacey Ruiz, PA-C  nitroGLYCERIN (NITROSTAT) 0.4 MG SL tablet Place 1 tablet (0.4 mg total) under the tongue every 5 (five) minutes as needed for chest pain. 06/22/19   Kathleene Hazel, MD  phentermine (ADIPEX-P) 37.5 MG tablet Take 1 tablet (37.5 mg total) by mouth daily before breakfast. 02/03/22   Whitmire, Dawn, FNP  rosuvastatin (CRESTOR) 40 MG tablet TAKE 1 TABLET BY MOUTH DAILY. PT NEEDS TO KEEP UPCOMING APPT IN AUG FOR FURTHER REFILLS 04/01/21   Kathleene Hazel, MD  venlafaxine XR (EFFEXOR-XR) 150 MG 24 hr capsule TAKE 1 CAPSULE BY MOUTH EVERY DAY WITH BREAKFAST 01/15/23   Rachel Moulds, MD  Vitamin D, Ergocalciferol, (DRISDOL) 1.25 MG (50000 UNIT) CAPS capsule Take 1 capsule (50,000 Units total) by mouth every 7 (seven) days. 01/07/22   Whitmire, Dawn, FNP      Allergies    Oxycodone, Latex, Other, and Sulfa antibiotics    Review of Systems   Review of Systems  Cardiovascular:  Positive for chest pain.  All other systems reviewed and are negative.   Physical Exam Updated Vital Signs BP Marland Kitchen)  159/85   Pulse 72   Temp 97.9 F (36.6 C) (Oral)   Resp 16   SpO2 99%  Physical Exam Vitals and nursing note reviewed.  Constitutional:      General: She is not in acute distress.    Appearance: Normal appearance.  HENT:     Head: Normocephalic and atraumatic.  Eyes:     General:        Right eye: No discharge.        Left eye: No discharge.  Cardiovascular:     Rate and Rhythm: Normal rate and regular rhythm.     Heart sounds: No murmur heard.    No friction rub. No gallop.  Pulmonary:      Effort: Pulmonary effort is normal.     Breath sounds: Normal breath sounds.  Chest:     Comments: No TTP of chest wall Abdominal:     General: Bowel sounds are normal.     Palpations: Abdomen is soft.  Skin:    General: Skin is warm and dry.     Capillary Refill: Capillary refill takes less than 2 seconds.  Neurological:     Mental Status: She is alert and oriented to person, place, and time.  Psychiatric:        Mood and Affect: Mood normal.        Behavior: Behavior normal.     ED Results / Procedures / Treatments   Labs (all labs ordered are listed, but only abnormal results are displayed) Labs Reviewed  BASIC METABOLIC PANEL  CBC  TROPONIN I (HIGH SENSITIVITY)  TROPONIN I (HIGH SENSITIVITY)    EKG None  Radiology DG Chest 2 View  Result Date: 01/18/2023 CLINICAL DATA:  Chest pain for 4 days. EXAM: CHEST - 2 VIEW COMPARISON:  02/13/2021. FINDINGS: The heart size and mediastinal contours are within normal limits. There is atherosclerotic calcification of the aorta. No consolidation, effusion, or pneumothorax. Degenerative changes are present in the thoracic spine. No acute osseous abnormality. IMPRESSION: No active cardiopulmonary disease. Electronically Signed   By: Thornell Sartorius M.D.   On: 01/18/2023 20:41    Procedures Procedures    Medications Ordered in ED Medications - No data to display  ED Course/ Medical Decision Making/ A&P Clinical Course as of 01/18/23 2348  Mon Jan 18, 2023  2343 [place] [CP]    Clinical Course User Index [CP] Olene Floss, PA-C                                 Medical Decision Making Amount and/or Complexity of Data Reviewed Labs: ordered. Radiology: ordered.   This patient is a 62 y.o. female  who presents to the ED for concern of chest pain, indigestion.   Differential diagnoses prior to evaluation: The emergent differential diagnosis includes, but is not limited to,  ACS, AAS, PE, Mallory-Weiss, Boerhaave's,  Pneumonia, acute bronchitis, asthma or COPD exacerbation, anxiety, MSK pain or traumatic injury to the chest, acid reflux versus other . This is not an exhaustive differential.   Past Medical History / Co-morbidities / Social History:  hypertension, hyperlipidemia, anxiety, prediabetes, history of thyroid cancer, history of breast cancer   Additional history: Chart reviewed. Pertinent results include: Reviewed previous cardiac imaging including echo, cath, she had an 80% blockage of the LAD with stenting in 2021  Physical Exam: Physical exam performed. The pertinent findings include: Vital signs overall stable in the emergency  department other than some hypertension, blood pressure 159/85 most recently.  Stable oxygen saturation on room air, no tenderness to palpation of chest wall.  Lab Tests/Imaging studies: I personally interpreted labs/imaging and the pertinent results include: Troponin normal x 2, no delta, CBC unremarkable, metabolic panel unremarkable, independently interpreted plain film radiograph of the chest which shows no evidence of acute intrathoracic abnormality. I agree with the radiologist interpretation.  Cardiac monitoring: EKG obtained and interpreted by myself and attending physician which shows: NSR, nonspecific T wave abnormality, no new change from baseline   Consults: I spoke with the cardiologist, Dr. Maximino Sarin who after review of patient's lab work, imaging, EKG, and cardiac history does not feel that she needs admission for further cardiac workup today, and with her having flat troponin, chest pain-free at this time, believe she is stable for close follow-up with her cardiologist.  I agree with this assessment, patient informed and is comfortable with this plan.   Disposition: After consideration of the diagnostic results and the patients response to treatment, I feel that patient with increase frequency of chest pain, worsening angina versus GERD, gastritis are  possibilities, considering her previous LAD blockage with stent, and patient's report that this is very similar and I do think that close cardiac follow-up is prudent, no indication for admission at this time after discussion with cardiology however.   emergency department workup does not suggest an emergent condition requiring admission or immediate intervention beyond what has been performed at this time. The plan is: as above. The patient is safe for discharge and has been instructed to return immediately for worsening symptoms, change in symptoms or any other concerns.  Final Clinical Impression(s) / ED Diagnoses Final diagnoses:  Chest pain, unspecified type    Rx / DC Orders ED Discharge Orders     None         [place] 01/18/23 2348    Durwin Glaze, MD 01/19/23 0040

## 2023-01-18 NOTE — Discharge Instructions (Signed)
As we discussed your workup today was reassuring, please follow-up with your cardiologist for your regular scheduled appointment next week, please return to the emergency department if you have worsening chest pain or chest pain that does not resolve with nitroglycerin.

## 2023-01-18 NOTE — ED Provider Triage Note (Signed)
Emergency Medicine Provider Triage Evaluation Note  Bethany [name] , a 62 y.o. female  was evaluated in triage.  Pt complains of chest pain.  Review of Systems  Positive: Chest pain Negative: Abdominal pain, NVD  Physical Exam  BP (!) 139/99   Pulse 72   Temp 98.2 F (36.8 C) (Oral)   Resp 16   SpO2 95%  Gen:   Awake, no distress   Resp:  Normal effort  MSK:   Moves extremities without difficulty  Other:    Medical Decision Making  Medically screening exam initiated at 8:17 PM.  Appropriate orders placed.  Bethany [name] was informed that the remainder of the evaluation will be completed by another provider, this initial triage assessment does not replace that evaluation, and the importance of remaining in the ED until their evaluation is complete.  Dr Clifton James is cardiology recommended coming to ER for chest pain work up.  History CAD, stent 2020, feels like previous cardiac events. Took nitroglycerin today and helped relieve chest pain. Chest pain 5 days relieved by nitroglycerin, not relieved by rest.  Reports weakness and sweating. Denies exertional chest pain, shortness of breath.   Smitty Knudsen, PA-C 01/18/23 2021

## 2023-01-18 NOTE — ED Triage Notes (Signed)
Pt reports burning chest pain and gas x 4-5 days ago. Pt reports hx of stent placement.

## 2023-01-25 ENCOUNTER — Encounter: Payer: Self-pay | Admitting: Cardiovascular Disease

## 2023-01-25 ENCOUNTER — Other Ambulatory Visit: Payer: Self-pay | Admitting: Hematology and Oncology

## 2023-01-25 ENCOUNTER — Ambulatory Visit: Payer: 59 | Attending: Cardiovascular Disease | Admitting: Cardiovascular Disease

## 2023-01-25 VITALS — BP 130/90 | HR 77 | Ht 66.0 in | Wt 208.8 lb

## 2023-01-25 DIAGNOSIS — I25110 Atherosclerotic heart disease of native coronary artery with unstable angina pectoris: Secondary | ICD-10-CM

## 2023-01-25 DIAGNOSIS — N6489 Other specified disorders of breast: Secondary | ICD-10-CM

## 2023-01-25 MED ORDER — REPATHA SURECLICK 140 MG/ML ~~LOC~~ SOAJ: 140.0000 mg | SUBCUTANEOUS | 11 refills | Status: AC

## 2023-01-25 NOTE — Patient Instructions (Signed)
Medication Instructions:  No changes - Repatha refills sent to [facility]  *If you need a refill on your cardiac medications before your next appointment, please call your pharmacy*   Lab Work: None today   Testing/Procedures: Your physician has requested that you have a cardiac catheterization. Cardiac catheterization is used to diagnose and/or treat various heart conditions. Doctors may recommend this procedure for a number of different reasons. The most common reason is to evaluate chest pain. Chest pain can be a symptom of coronary artery disease (CAD), and cardiac catheterization can show whether plaque is narrowing or blocking your heart's arteries. This procedure is also used to evaluate the valves, as well as measure the blood flow and oxygen levels in different parts of your heart. For further information please visit [email or web address] Please follow instruction sheet, as given.   Follow-Up: At [facility], you and your health needs are our priority.  As part of our continuing mission to provide you with exceptional heart care, we have created designated Provider Care Teams.  These Care Teams include your primary Cardiologist (physician) and Advanced Practice Providers (APPs -  Physician Assistants and Nurse Practitioners) who all work together to provide you with the care you need, when you need it.  Your next appointment:   4 week(s)  Provider:   Jari Favre, PA-C, [name], PA-C, Robin Searing, NP, Jacolyn Reedy, PA-C, Eligha Bridegroom, NP, Tereso Newcomer, PA-C, or Perlie Gold, PA-C              Cardiac/Peripheral Catheterization   You are scheduled for a Cardiac Catheterization on Thursday, November 14 with Dr. Verne Carrow.  1. Please arrive at the [facility] (Main Entrance A) at [facility]: [address] [place], [place] [address] at 5:30 AM (This time is TWO hour(s) before your procedure to ensure your preparation). Free valet parking service  is available. You will check in at ADMITTING. The support person will be asked to wait in the waiting room.  It is OK to have someone drop you off and come back when you are ready to be discharged.        Special note: Every effort is made to have your procedure done on time. Please understand that emergencies sometimes delay scheduled procedures.  2. Diet: Do not eat solid foods after midnight.  You may have clear liquids until 5 AM the day of the procedure.  3. Labs: completed 01/18/23  4. Medication instructions in preparation for your procedure:   Contrast Allergy: No   On the morning of your procedure, take Aspirin 81 mg and any morning medicines NOT listed above.  You may use sips of water.  5. Plan to go home the same day, you will only stay overnight if medically necessary. 6. You MUST have a responsible adult to drive you home. 7. An adult MUST be with you the first 24 hours after you arrive home. 8. Bring a current list of your medications, and the last time and date medication taken. 9. Bring ID and current insurance cards. 10.Please wear clothes that are easy to get on and off and wear slip-on shoes.  Thank you for allowing [place] to care for you!   -- [facility]

## 2023-01-25 NOTE — Progress Notes (Signed)
Chief Complaint  Patient presents with   Follow-up    CAD/Chest pain   History of Present Illness: 62 yo female with history of CAD, breast cancer in 2019 s/p radiation therapy, hiatal hernia, thyroid cancer, hypothyroidism, HTN, former tobacco abuse, vasovagal syncope, SVT and anxiety here today for cardiac follow up. I saw her as a new patient for evaluation of chest pain in March 2021. She had been seen in the ED in January 2021 with chest pain. Gated cardiac CTA on 06/20/19 showed a LAD stenosis that appears to be flow limiting. Echo 06/05/19 showed normal LV systolic function and no valve disease. Cardiac cath 06/29/19 with severe mid LAD stenosis treated with a drug eluting stent. No disease in the Circumflex or RCA. She was diagnosed with breast cancer in 2019 and has undergone radiation therapy and lumpectomy. She is felt to have vasovagal syncope. Echo October 2023 with LVEF=65-70%. No valve disease. Cardiac monitor October 2023 with sinus with 2 runs of VT (longest 9 beats) and 13 runs of SVT (longest 8 seconds). Frequent PACs. She was started on Toprol. She was seen in the ED on 01/18/23 with chest pain. Troponin negative. EKG with sinus and non-specific T wave abnormality, unchanged from EKG in 2022. She was discharged home.   She is here today for follow up. She continues to have chest pain almost every day. This is at rest and with exertion. The pain feels like the pain she had before her cardiac stent was placed. She denies dyspnea, palpitations, lower extremity edema, orthopnea, PND, dizziness, near syncope or syncope.   Primary Care Physician: Lorenda Ishihara, MD  Past Medical History:  Diagnosis Date   ADD (attention deficit disorder)    Anxiety    B12 deficiency    Breast cancer (HCC) 2019   left breast cancer/diagnosed in 10/2017/taking radiation treatment until 03/15/18   Breast mass, left 11/2017   going thru radiation until 02/2018   CAD (coronary artery disease)     a. DES to LAD 06/2019.   Chest pain 06/19/2014   COPD (chronic obstructive pulmonary disease) (HCC)    beginning stages/small scar   Dental crowns present    Depression    Hiatal hernia    History of hiatal hernia    no current med.   History of thyroid cancer 12/15/2017   Hypertension    states under control with med., has been on med. x 1 yr.   Hypothyroidism    Malignant neoplasm of upper-outer quadrant of left breast in female, estrogen receptor positive (HCC)    Mild hyperlipidemia    Mucocele of appendix 10/03/2015   MVP (mitral valve prolapse)    not seen on echo 05/2019   Personal history of radiation therapy 2019   Left Breast Cancer   PONV (postoperative nausea and vomiting)    Pre-diabetes    Prediabetes    Radiation fibrosis of lung (HCC)    Syncope and collapse 12/25/2008   Qualifier: Diagnosis of  By: [facility]     Urinary incontinence    USI    Vasovagal syncope    Vitamin D deficiency     Past Surgical History:  Procedure Laterality Date   ABDOMINAL HYSTERECTOMY     partial   APPENDECTOMY     AXILLARY SENTINEL NODE BIOPSY Left 12/09/2017   Procedure: AXILLARY SENTINEL NODE BIOPSY;  Surgeon: Harriette Bouillon, MD;  Location: [facility];  Service: General;  Laterality: Left;   BREAST EXCISIONAL BIOPSY  Right 09/20/2019   BREAST LUMPECTOMY Left 11/2017   BREAST LUMPECTOMY Left 02/25/2022   Procedure: LEFT BREAST LUMPECTOMY;  Surgeon: Harriette Bouillon, MD;  Location: [facility] OR;  Service: General;  Laterality: Left;   BREAST LUMPECTOMY WITH NEEDLE LOCALIZATION Left 11/25/2017   Procedure: LEFT BREAST LUMPECTOMY WITH NEEDLE LOCALIZATION;  Surgeon: Harriette Bouillon, MD;  Location: [facility];  Service: General;  Laterality: Left;   BREAST LUMPECTOMY WITH RADIOACTIVE SEED LOCALIZATION Right 09/20/2019   Procedure: RIGHT BREAST LUMPECTOMY WITH RADIOACTIVE SEED LOCALIZATION;  Surgeon: Harriette Bouillon, MD;  Location: [facility];  Service: General;  Laterality: Right;   CARDIAC CATHETERIZATION     COLONOSCOPY WITH PROPOFOL  10/03/2015   CORONARY STENT INTERVENTION N/A 06/29/2019   Procedure: CORONARY STENT INTERVENTION;  Surgeon: Kathleene Hazel, MD;  Location: [facility];  Service: Cardiovascular;  Laterality: N/A;   LAPAROSCOPIC APPENDECTOMY N/A 10/03/2015   Procedure: APPENDECTOMY LAPAROSCOPIC;  Surgeon: Emelia Loron, MD;  Location: [facility] OR;  Service: General;  Laterality: N/A;   LEFT HEART CATH AND CORONARY ANGIOGRAPHY N/A 06/29/2019   Procedure: LEFT HEART CATH AND CORONARY ANGIOGRAPHY;  Surgeon: Kathleene Hazel, MD;  Location: [facility];  Service: Cardiovascular;  Laterality: N/A;   TOTAL THYROIDECTOMY  04/02/2003    Current Outpatient Medications  Medication Sig Dispense Refill   albuterol (VENTOLIN HFA) 108 (90 Base) MCG/ACT inhaler Inhale 2 puffs into the lungs every 6 (six) hours as needed for wheezing or shortness of breath. 18 g 6   cetirizine (ZYRTEC) 10 MG tablet Take 10 mg by mouth daily.     Cyanocobalamin (B-12 COMPLIANCE INJECTION IJ) Inject 1 Dose as directed every 30 (thirty) days.     Dulaglutide (TRULICITY) 3 MG/0.5ML SOPN Inject 3 mg as directed once a week. 2 mL 0   Evolocumab (REPATHA SURECLICK) 140 MG/ML SOAJ Inject 1 Pen into the skin every 14 (fourteen) days. 2 mL 11   HYDROcodone-acetaminophen (NORCO) 10-325 MG tablet Take 1 tablet by mouth every 6 (six) hours as needed. 20 tablet 0   ibuprofen (ADVIL) 800 MG tablet Take 1 tablet (800 mg total) by mouth every 8 (eight) hours as needed. 30 tablet 0   levothyroxine (SYNTHROID) 112 MCG tablet Take 112 mcg by mouth daily.     losartan (COZAAR) 100 MG tablet TAKE 1 TABLET BY MOUTH EVERY DAY 90 tablet 3   metoprolol succinate (TOPROL XL) 25 MG 24 hr tablet Take 1 tablet (25 mg total) by mouth daily. 90 tablet 3   nitroGLYCERIN (NITROSTAT) 0.4 MG SL tablet Place 1 tablet (0.4 mg total) under the tongue  every 5 (five) minutes as needed for chest pain. 25 tablet 3   phentermine (ADIPEX-P) 37.5 MG tablet Take 1 tablet (37.5 mg total) by mouth daily before breakfast. 30 tablet 0   rosuvastatin (CRESTOR) 40 MG tablet TAKE 1 TABLET BY MOUTH DAILY. PT NEEDS TO KEEP UPCOMING APPT IN AUG FOR FURTHER REFILLS 90 tablet 3   venlafaxine XR (EFFEXOR-XR) 150 MG 24 hr capsule TAKE 1 CAPSULE BY MOUTH EVERY DAY WITH BREAKFAST 90 capsule 0   Vitamin D, Ergocalciferol, (DRISDOL) 1.25 MG (50000 UNIT) CAPS capsule Take 1 capsule (50,000 Units total) by mouth every 7 (seven) days. 4 capsule 0   No current facility-administered medications for this visit.    Allergies  Allergen Reactions   Oxycodone Nausea And Vomiting   Latex Rash   Other Rash    "Sutures caused severe rash."  Sulfa Antibiotics Hives    Social History   Socioeconomic History   Marital status: Married    Spouse name: Deniece Portela   Number of children: 3   Years of education: Not on file   Highest education level: Not on file  Occupational History   Occupation: stay at home spouse  Tobacco Use   Smoking status: Former    Current packs/day: 0.00    Types: Cigarettes    Quit date: 08/14/2014    Years since quitting: 8.4   Smokeless tobacco: Never  Vaping Use   Vaping status: Never Used  Substance and Sexual Activity   Alcohol use: Not Currently   Drug use: No   Sexual activity: Not Currently    Partners: Male    Birth control/protection: Surgical    Comment: hysterectomy  Other Topics Concern   Not on file  Social History Narrative   Not on file   Social Determinants of Health   Financial Resource Strain: Not on file  Food Insecurity: Not on file  Transportation Needs: No Transportation Needs (12/23/2017)   PRAPARE - Administrator, Civil Service (Medical): No    Lack of Transportation (Non-Medical): No  Physical Activity: Not on file  Stress: Not on file  Social Connections: Not on file  Intimate Partner  Violence: Not At Risk (12/23/2017)   Humiliation, Afraid, Rape, and Kick questionnaire    Fear of Current or Ex-Partner: No    Emotionally Abused: No    Physically Abused: No    Sexually Abused: No    Family History  Problem Relation Age of Onset   Colon polyps Mother    High Cholesterol Mother    Alcoholism Mother    Hypertension Father    Heart disease Father    Non-Hodgkin's lymphoma Father 67   Cancer Father    Depression Father    Anxiety disorder Father    Alcoholism Father    Hypertension Brother    Lung cancer Paternal Aunt        d. 16   Throat cancer Maternal Grandfather        d. 82   Throat cancer Paternal Grandmother        dx < 50   Heart disease Paternal Grandfather    Colon cancer Other        great maternal aunt    Stomach cancer Neg Hx    Rectal cancer Neg Hx    Esophageal cancer Neg Hx     Review of Systems:  As stated in the HPI and otherwise negative.   There were no vitals taken for this visit.  Physical Examination: General: Well developed, well nourished, NAD  HEENT: OP clear, mucus membranes moist  SKIN: warm, dry. No rashes. Neuro: No focal deficits  Musculoskeletal: Muscle strength 5/5 all ext  Psychiatric: Mood and affect normal  Neck: No JVD, no carotid bruits, no thyromegaly, no lymphadenopathy.  Lungs:Clear bilaterally, no wheezes, rhonci, crackles Cardiovascular: Regular rate and rhythm. No murmurs, gallops or rubs. Abdomen:Soft. Bowel sounds present. Non-tender.  Extremities: No lower extremity edema. Pulses are 2 + in the bilateral DP/PT.  EKG:  EKG is not ordered today. The ekg ordered today demonstrates  Echo 06/05/19:  1. Left ventricular ejection fraction, by estimation, is 60 to 65%. The  left ventricle has normal function. The left ventricle has no regional  wall motion abnormalities. There is mild asymmetric left ventricular  hypertrophy. Left ventricular diastolic  parameters were normal.  2. Right ventricular  systolic function is normal. The right ventricular  size is normal. There is mildly elevated pulmonary artery systolic  pressure.   3. The mitral valve is normal in structure. No evidence of mitral valve  regurgitation. No evidence of mitral stenosis.   4. The aortic valve is normal in structure. Aortic valve regurgitation is  not visualized. No aortic stenosis is present.   Cardiac cath 06/29/19: Left Anterior Descending  Vessel is large.  Mid LAD lesion is 80% stenosed.  Left Circumflex  Vessel is large.  Right Coronary Artery  Vessel is large.  Intervention    Recent Labs: 01/18/2023: BUN 8; Creatinine, Ser 0.78; Hemoglobin 13.3; Platelets 313; Potassium 4.1; Sodium 138   Lipid Panel    Component Value Date/Time   CHOL 128 06/30/2021 0955   TRIG 54 06/30/2021 0955   HDL 83 06/30/2021 0955   CHOLHDL 1.5 06/30/2021 0955   CHOLHDL 3.7 06/19/2014 2234   VLDL 18 06/19/2014 2234   LDLCALC 33 06/30/2021 0955     Wt Readings from Last 3 Encounters:  02/25/22 88.5 kg  01/30/22 91.4 kg  12/02/21 90.3 kg    Assessment and Plan:   1. CAD without angina: Recent chest pain concerning for unstable angina. Chest pain is responsive to NTG. Cardiac cath in 2021 with severe mid LAD stenosis treated with a drug eluting stent. Will plan cardiac cath at [facility] on 01/28/23 at 7:30am.  Continue ASA, Toprol and Crestor.    I have reviewed the risks, indications, and alternatives to cardiac catheterization, possible angioplasty, and stenting with the patient. Risks include but are not limited to bleeding, infection, vascular injury, stroke, myocardial infection, arrhythmia, kidney injury, radiation-related injury in the case of prolonged fluoroscopy use, emergency cardiac surgery, and death. The patient understands the risks of serious complication is 1-2 in 1000 with diagnostic cardiac cath and 1-2% or less with angioplasty/stenting.   2. HTN: BP is well controlled. No changes.   3.  Hyperlipidemia: LDL at goal in 2023 on Crestor and Repatha. No changes today.   Labs/ tests ordered today include:  No orders of the defined types were placed in this encounter.  Disposition:   F/U with me in 12 months.    Signed, Verne Carrow, MD 01/25/2023 3:50 PM    [facility] [address] [place], [place], [place]  [address] Phone: [phone]; Fax: [phone]

## 2023-01-25 NOTE — H&P (View-Only) (Signed)
 Chief Complaint  Patient presents with   Follow-up    CAD/Chest pain   History of Present Illness: 62 yo female with history of CAD, breast cancer in 2019 s/p radiation therapy, hiatal hernia, thyroid cancer, hypothyroidism, HTN, former tobacco abuse, vasovagal syncope, SVT and anxiety here today for cardiac follow up. I saw her as a new patient for evaluation of chest pain in March 2021. She had been seen in the ED in January 2021 with chest pain. Gated cardiac CTA on 06/20/19 showed a LAD stenosis that appears to be flow limiting. Echo 06/05/19 showed normal LV systolic function and no valve disease. Cardiac cath 06/29/19 with severe mid LAD stenosis treated with a drug eluting stent. No disease in the Circumflex or RCA. She was diagnosed with breast cancer in 2019 and has undergone radiation therapy and lumpectomy. She is felt to have vasovagal syncope. Echo October 2023 with LVEF=65-70%. No valve disease. Cardiac monitor October 2023 with sinus with 2 runs of VT (longest 9 beats) and 13 runs of SVT (longest 8 seconds). Frequent PACs. She was started on Toprol. She was seen in the ED on 01/18/23 with chest pain. Troponin negative. EKG with sinus and non-specific T wave abnormality, unchanged from EKG in 2022. She was discharged home.   She is here today for follow up. She continues to have chest pain almost every day. This is at rest and with exertion. The pain feels like the pain she had before her cardiac stent was placed. She denies dyspnea, palpitations, lower extremity edema, orthopnea, PND, dizziness, near syncope or syncope.   Primary Care Physician: Lorenda Ishihara, MD  Past Medical History:  Diagnosis Date   ADD (attention deficit disorder)    Anxiety    B12 deficiency    Breast cancer (HCC) 2019   left breast cancer/diagnosed in 10/2017/taking radiation treatment until 03/15/18   Breast mass, left 11/2017   going thru radiation until 02/2018   CAD (coronary artery disease)     a. DES to LAD 06/2019.   Chest pain 06/19/2014   COPD (chronic obstructive pulmonary disease) (HCC)    beginning stages/small scar   Dental crowns present    Depression    Hiatal hernia    History of hiatal hernia    no current med.   History of thyroid cancer 12/15/2017   Hypertension    states under control with med., has been on med. x 1 yr.   Hypothyroidism    Malignant neoplasm of upper-outer quadrant of left breast in female, estrogen receptor positive (HCC)    Mild hyperlipidemia    Mucocele of appendix 10/03/2015   MVP (mitral valve prolapse)    not seen on echo 05/2019   Personal history of radiation therapy 2019   Left Breast Cancer   PONV (postoperative nausea and vomiting)    Pre-diabetes    Prediabetes    Radiation fibrosis of lung (HCC)    Syncope and collapse 12/25/2008   Qualifier: Diagnosis of  By: [facility]     Urinary incontinence    USI    Vasovagal syncope    Vitamin D deficiency     Past Surgical History:  Procedure Laterality Date   ABDOMINAL HYSTERECTOMY     partial   APPENDECTOMY     AXILLARY SENTINEL NODE BIOPSY Left 12/09/2017   Procedure: AXILLARY SENTINEL NODE BIOPSY;  Surgeon: Harriette Bouillon, MD;  Location: [facility];  Service: General;  Laterality: Left;   BREAST EXCISIONAL BIOPSY  Right 09/20/2019   BREAST LUMPECTOMY Left 11/2017   BREAST LUMPECTOMY Left 02/25/2022   Procedure: LEFT BREAST LUMPECTOMY;  Surgeon: Harriette Bouillon, MD;  Location: [facility] OR;  Service: General;  Laterality: Left;   BREAST LUMPECTOMY WITH NEEDLE LOCALIZATION Left 11/25/2017   Procedure: LEFT BREAST LUMPECTOMY WITH NEEDLE LOCALIZATION;  Surgeon: Harriette Bouillon, MD;  Location: [facility];  Service: General;  Laterality: Left;   BREAST LUMPECTOMY WITH RADIOACTIVE SEED LOCALIZATION Right 09/20/2019   Procedure: RIGHT BREAST LUMPECTOMY WITH RADIOACTIVE SEED LOCALIZATION;  Surgeon: Harriette Bouillon, MD;  Location: [facility];  Service: General;  Laterality: Right;   CARDIAC CATHETERIZATION     COLONOSCOPY WITH PROPOFOL  10/03/2015   CORONARY STENT INTERVENTION N/A 06/29/2019   Procedure: CORONARY STENT INTERVENTION;  Surgeon: Kathleene Hazel, MD;  Location: [facility];  Service: Cardiovascular;  Laterality: N/A;   LAPAROSCOPIC APPENDECTOMY N/A 10/03/2015   Procedure: APPENDECTOMY LAPAROSCOPIC;  Surgeon: Emelia Loron, MD;  Location: [facility] OR;  Service: General;  Laterality: N/A;   LEFT HEART CATH AND CORONARY ANGIOGRAPHY N/A 06/29/2019   Procedure: LEFT HEART CATH AND CORONARY ANGIOGRAPHY;  Surgeon: Kathleene Hazel, MD;  Location: [facility];  Service: Cardiovascular;  Laterality: N/A;   TOTAL THYROIDECTOMY  04/02/2003    Current Outpatient Medications  Medication Sig Dispense Refill   albuterol (VENTOLIN HFA) 108 (90 Base) MCG/ACT inhaler Inhale 2 puffs into the lungs every 6 (six) hours as needed for wheezing or shortness of breath. 18 g 6   cetirizine (ZYRTEC) 10 MG tablet Take 10 mg by mouth daily.     Cyanocobalamin (B-12 COMPLIANCE INJECTION IJ) Inject 1 Dose as directed every 30 (thirty) days.     Dulaglutide (TRULICITY) 3 MG/0.5ML SOPN Inject 3 mg as directed once a week. 2 mL 0   Evolocumab (REPATHA SURECLICK) 140 MG/ML SOAJ Inject 1 Pen into the skin every 14 (fourteen) days. 2 mL 11   HYDROcodone-acetaminophen (NORCO) 10-325 MG tablet Take 1 tablet by mouth every 6 (six) hours as needed. 20 tablet 0   ibuprofen (ADVIL) 800 MG tablet Take 1 tablet (800 mg total) by mouth every 8 (eight) hours as needed. 30 tablet 0   levothyroxine (SYNTHROID) 112 MCG tablet Take 112 mcg by mouth daily.     losartan (COZAAR) 100 MG tablet TAKE 1 TABLET BY MOUTH EVERY DAY 90 tablet 3   metoprolol succinate (TOPROL XL) 25 MG 24 hr tablet Take 1 tablet (25 mg total) by mouth daily. 90 tablet 3   nitroGLYCERIN (NITROSTAT) 0.4 MG SL tablet Place 1 tablet (0.4 mg total) under the tongue  every 5 (five) minutes as needed for chest pain. 25 tablet 3   phentermine (ADIPEX-P) 37.5 MG tablet Take 1 tablet (37.5 mg total) by mouth daily before breakfast. 30 tablet 0   rosuvastatin (CRESTOR) 40 MG tablet TAKE 1 TABLET BY MOUTH DAILY. PT NEEDS TO KEEP UPCOMING APPT IN AUG FOR FURTHER REFILLS 90 tablet 3   venlafaxine XR (EFFEXOR-XR) 150 MG 24 hr capsule TAKE 1 CAPSULE BY MOUTH EVERY DAY WITH BREAKFAST 90 capsule 0   Vitamin D, Ergocalciferol, (DRISDOL) 1.25 MG (50000 UNIT) CAPS capsule Take 1 capsule (50,000 Units total) by mouth every 7 (seven) days. 4 capsule 0   No current facility-administered medications for this visit.    Allergies  Allergen Reactions   Oxycodone Nausea And Vomiting   Latex Rash   Other Rash    "Sutures caused severe rash."  Sulfa Antibiotics Hives    Social History   Socioeconomic History   Marital status: Married    Spouse name: Deniece Portela   Number of children: 3   Years of education: Not on file   Highest education level: Not on file  Occupational History   Occupation: stay at home spouse  Tobacco Use   Smoking status: Former    Current packs/day: 0.00    Types: Cigarettes    Quit date: 08/14/2014    Years since quitting: 8.4   Smokeless tobacco: Never  Vaping Use   Vaping status: Never Used  Substance and Sexual Activity   Alcohol use: Not Currently   Drug use: No   Sexual activity: Not Currently    Partners: Male    Birth control/protection: Surgical    Comment: hysterectomy  Other Topics Concern   Not on file  Social History Narrative   Not on file   Social Determinants of Health   Financial Resource Strain: Not on file  Food Insecurity: Not on file  Transportation Needs: No Transportation Needs (12/23/2017)   PRAPARE - Administrator, Civil Service (Medical): No    Lack of Transportation (Non-Medical): No  Physical Activity: Not on file  Stress: Not on file  Social Connections: Not on file  Intimate Partner  Violence: Not At Risk (12/23/2017)   Humiliation, Afraid, Rape, and Kick questionnaire    Fear of Current or Ex-Partner: No    Emotionally Abused: No    Physically Abused: No    Sexually Abused: No    Family History  Problem Relation Age of Onset   Colon polyps Mother    High Cholesterol Mother    Alcoholism Mother    Hypertension Father    Heart disease Father    Non-Hodgkin's lymphoma Father 67   Cancer Father    Depression Father    Anxiety disorder Father    Alcoholism Father    Hypertension Brother    Lung cancer Paternal Aunt        d. 16   Throat cancer Maternal Grandfather        d. 82   Throat cancer Paternal Grandmother        dx < 50   Heart disease Paternal Grandfather    Colon cancer Other        great maternal aunt    Stomach cancer Neg Hx    Rectal cancer Neg Hx    Esophageal cancer Neg Hx     Review of Systems:  As stated in the HPI and otherwise negative.   There were no vitals taken for this visit.  Physical Examination: General: Well developed, well nourished, NAD  HEENT: OP clear, mucus membranes moist  SKIN: warm, dry. No rashes. Neuro: No focal deficits  Musculoskeletal: Muscle strength 5/5 all ext  Psychiatric: Mood and affect normal  Neck: No JVD, no carotid bruits, no thyromegaly, no lymphadenopathy.  Lungs:Clear bilaterally, no wheezes, rhonci, crackles Cardiovascular: Regular rate and rhythm. No murmurs, gallops or rubs. Abdomen:Soft. Bowel sounds present. Non-tender.  Extremities: No lower extremity edema. Pulses are 2 + in the bilateral DP/PT.  EKG:  EKG is not ordered today. The ekg ordered today demonstrates  Echo 06/05/19:  1. Left ventricular ejection fraction, by estimation, is 60 to 65%. The  left ventricle has normal function. The left ventricle has no regional  wall motion abnormalities. There is mild asymmetric left ventricular  hypertrophy. Left ventricular diastolic  parameters were normal.  2. Right ventricular  systolic function is normal. The right ventricular  size is normal. There is mildly elevated pulmonary artery systolic  pressure.   3. The mitral valve is normal in structure. No evidence of mitral valve  regurgitation. No evidence of mitral stenosis.   4. The aortic valve is normal in structure. Aortic valve regurgitation is  not visualized. No aortic stenosis is present.   Cardiac cath 06/29/19: Left Anterior Descending  Vessel is large.  Mid LAD lesion is 80% stenosed.  Left Circumflex  Vessel is large.  Right Coronary Artery  Vessel is large.  Intervention    Recent Labs: 01/18/2023: BUN 8; Creatinine, Ser 0.78; Hemoglobin 13.3; Platelets 313; Potassium 4.1; Sodium 138   Lipid Panel    Component Value Date/Time   CHOL 128 06/30/2021 0955   TRIG 54 06/30/2021 0955   HDL 83 06/30/2021 0955   CHOLHDL 1.5 06/30/2021 0955   CHOLHDL 3.7 06/19/2014 2234   VLDL 18 06/19/2014 2234   LDLCALC 33 06/30/2021 0955     Wt Readings from Last 3 Encounters:  02/25/22 88.5 kg  01/30/22 91.4 kg  12/02/21 90.3 kg    Assessment and Plan:   1. CAD without angina: Recent chest pain concerning for unstable angina. Chest pain is responsive to NTG. Cardiac cath in 2021 with severe mid LAD stenosis treated with a drug eluting stent. Will plan cardiac cath at [facility] on 01/28/23 at 7:30am.  Continue ASA, Toprol and Crestor.    I have reviewed the risks, indications, and alternatives to cardiac catheterization, possible angioplasty, and stenting with the patient. Risks include but are not limited to bleeding, infection, vascular injury, stroke, myocardial infection, arrhythmia, kidney injury, radiation-related injury in the case of prolonged fluoroscopy use, emergency cardiac surgery, and death. The patient understands the risks of serious complication is 1-2 in 1000 with diagnostic cardiac cath and 1-2% or less with angioplasty/stenting.   2. HTN: BP is well controlled. No changes.   3.  Hyperlipidemia: LDL at goal in 2023 on Crestor and Repatha. No changes today.   Labs/ tests ordered today include:  No orders of the defined types were placed in this encounter.  Disposition:   F/U with me in 12 months.    Signed, Verne Carrow, MD 01/25/2023 3:50 PM    [facility] [address] [place], [place], [place]  [address] Phone: [phone]; Fax: [phone]

## 2023-01-26 ENCOUNTER — Telehealth: Payer: Self-pay | Admitting: *Deleted

## 2023-01-26 NOTE — Telephone Encounter (Signed)
Cardiac Catheterization scheduled at [facility] for: Thursday January 28, 2023 7:30 AM Arrival time [facility] Main Entrance A at: 5:30 AM  Nothing to eat after midnight prior to procedure, clear liquids until 5 AM day of procedure.  Medication instructions: -Usual morning medications can be taken with sips of water including aspirin 81 mg.  Plan to go home the same day, you will only stay overnight if medically necessary.  You must have responsible adult to drive you home.  Someone must be with you the first 24 hours after you arrive home.  Reviewed procedure instructions with patient.

## 2023-01-27 ENCOUNTER — Telehealth: Payer: Self-pay | Admitting: *Deleted

## 2023-01-27 NOTE — Telephone Encounter (Signed)
Received message from pre cert department:  [facility] is saying Berkley Harvey is not on file, however, I have that it's approved but the auth # is generating.   AH They would like for her to be postponed until it generates Dr. Clifton James was included in the email from the [facility]. ____________________________________________________________  Per Dr. Clifton James, okay to cancel procedure for tomorrow and reschedule for next available cath day which is 02/08/23.  Called cath lab and cancelled for tomorrow morning due to incomplete authorizatin from [place].    Called and spoke w the patient.  She feels unsettled to wait, until then, and also unsettled not having Dr. Clifton James do the cath.  She also has plans to go out of town for Thanksgiving.  We will talk tomorrow again once she decides if she'd like another doctor to do the cath sooner than 02/08/23.

## 2023-01-28 ENCOUNTER — Ambulatory Visit (HOSPITAL_COMMUNITY): Admission: RE | Admit: 2023-01-28 | Payer: 59 | Source: Home / Self Care | Admitting: Cardiovascular Disease

## 2023-01-28 ENCOUNTER — Encounter: Payer: Self-pay | Admitting: Cardiovascular Disease

## 2023-01-28 ENCOUNTER — Encounter (HOSPITAL_COMMUNITY): Admission: RE | Payer: Self-pay | Source: Home / Self Care

## 2023-01-28 SURGERY — LEFT HEART CATH AND CORONARY ANGIOGRAPHY
Anesthesia: LOCAL

## 2023-01-28 NOTE — Telephone Encounter (Signed)
Called patient and reviewed plan to reschedule cath from today due to insurance auth.  Confirmed w pre cert (A. Hepler) okay to reschedule.  Pt has been scheduled with Dr. Swaziland on 02/03/23 at 1:00 pm.  She is aware to arrive at 11:00 am.  Will send updated instructions to her through a message in MyChart.  Her labs and EKG are still up to date.

## 2023-02-02 ENCOUNTER — Telehealth: Payer: Self-pay | Admitting: *Deleted

## 2023-02-02 NOTE — Telephone Encounter (Addendum)
Cardiac Catheterization scheduled at [facility] for: Wednesday February 03, 2023 1 PM Arrival time [facility] Main Entrance A at: 11 AM  Nothing to eat after midnight prior to procedure, clear liquids until 5 AM day of procedure.  Medication instructions: -Usual morning medications can be taken with sips of water including aspirin 81 mg.  Plan to go home the same day, you will only stay overnight if medically necessary.  You must have responsible adult to drive you home.  Someone must be with you the first 24 hours after you arrive home.  Reviewed procedure instructions with patient.

## 2023-02-03 ENCOUNTER — Other Ambulatory Visit: Payer: Self-pay

## 2023-02-03 ENCOUNTER — Encounter (HOSPITAL_COMMUNITY)
Admission: RE | Disposition: A | Discharge status: Home / Self Care | Payer: 59 | Source: Home / Self Care | Attending: Cardiology

## 2023-02-03 ENCOUNTER — Ambulatory Visit (HOSPITAL_COMMUNITY)
Admission: RE | Admit: 2023-02-03 | Discharge: 2023-02-03 | Disposition: A | Discharge status: Home / Self Care | Payer: 59 | Attending: Cardiology | Admitting: Cardiology

## 2023-02-03 ENCOUNTER — Other Ambulatory Visit (HOSPITAL_COMMUNITY): Payer: Self-pay

## 2023-02-03 DIAGNOSIS — I10 Essential (primary) hypertension: Secondary | ICD-10-CM | POA: Insufficient documentation

## 2023-02-03 DIAGNOSIS — R079 Chest pain, unspecified: Secondary | ICD-10-CM | POA: Insufficient documentation

## 2023-02-03 DIAGNOSIS — Z7982 Long term (current) use of aspirin: Secondary | ICD-10-CM | POA: Insufficient documentation

## 2023-02-03 DIAGNOSIS — F419 Anxiety disorder, unspecified: Secondary | ICD-10-CM | POA: Diagnosis not present

## 2023-02-03 DIAGNOSIS — E039 Hypothyroidism, unspecified: Secondary | ICD-10-CM | POA: Diagnosis not present

## 2023-02-03 DIAGNOSIS — Z85850 Personal history of malignant neoplasm of thyroid: Secondary | ICD-10-CM | POA: Diagnosis not present

## 2023-02-03 DIAGNOSIS — Z79899 Other long term (current) drug therapy: Secondary | ICD-10-CM | POA: Diagnosis not present

## 2023-02-03 DIAGNOSIS — Z923 Personal history of irradiation: Secondary | ICD-10-CM | POA: Diagnosis not present

## 2023-02-03 DIAGNOSIS — Z955 Presence of coronary angioplasty implant and graft: Secondary | ICD-10-CM | POA: Insufficient documentation

## 2023-02-03 DIAGNOSIS — I471 Supraventricular tachycardia, unspecified: Secondary | ICD-10-CM | POA: Insufficient documentation

## 2023-02-03 DIAGNOSIS — Z853 Personal history of malignant neoplasm of breast: Secondary | ICD-10-CM | POA: Diagnosis not present

## 2023-02-03 DIAGNOSIS — Z87891 Personal history of nicotine dependence: Secondary | ICD-10-CM | POA: Insufficient documentation

## 2023-02-03 DIAGNOSIS — I2510 Atherosclerotic heart disease of native coronary artery without angina pectoris: Secondary | ICD-10-CM | POA: Diagnosis not present

## 2023-02-03 DIAGNOSIS — E785 Hyperlipidemia, unspecified: Secondary | ICD-10-CM | POA: Insufficient documentation

## 2023-02-03 HISTORY — PX: LEFT HEART CATH AND CORONARY ANGIOGRAPHY: CATH118249

## 2023-02-03 LAB — GLUCOSE, CAPILLARY: Glucose-Capillary: 108 mg/dL — ABNORMAL HIGH (ref 70–99)

## 2023-02-03 SURGERY — LEFT HEART CATH AND CORONARY ANGIOGRAPHY
Anesthesia: LOCAL

## 2023-02-03 MED ORDER — ASPIRIN 81 MG PO CHEW: 81.0000 mg | CHEWABLE_TABLET | ORAL | Status: DC

## 2023-02-03 MED ORDER — MIDAZOLAM HCL 2 MG/2ML IJ SOLN
INTRAMUSCULAR | Status: AC
  Filled 2023-02-03: qty 2

## 2023-02-03 MED ORDER — HEPARIN SODIUM (PORCINE) 1000 UNIT/ML IJ SOLN
INTRAMUSCULAR | Status: AC
  Filled 2023-02-03: qty 10

## 2023-02-03 MED ORDER — LIDOCAINE HCL (PF) 1 % IJ SOLN
INTRAMUSCULAR | Status: AC
  Filled 2023-02-03: qty 30

## 2023-02-03 MED ORDER — HYDRALAZINE HCL 20 MG/ML IJ SOLN: 10.0000 mg | INTRAMUSCULAR | Status: DC | PRN

## 2023-02-03 MED ORDER — SODIUM CHLORIDE 0.9 % WEIGHT BASED INFUSION
3.0000 mL/kg/h | INTRAVENOUS | Status: AC
  Administered 2023-02-03: 3 mL/kg/h via INTRAVENOUS

## 2023-02-03 MED ORDER — SODIUM CHLORIDE 0.9 % WEIGHT BASED INFUSION: 1.0000 mL/kg/h | INTRAVENOUS | Status: DC

## 2023-02-03 MED ORDER — VERAPAMIL HCL 2.5 MG/ML IV SOLN
INTRAVENOUS | Status: DC | PRN
  Administered 2023-02-03: 10 mL via INTRA_ARTERIAL

## 2023-02-03 MED ORDER — HEPARIN SODIUM (PORCINE) 1000 UNIT/ML IJ SOLN
INTRAMUSCULAR | Status: DC | PRN
  Administered 2023-02-03: 4500 [IU] via INTRAVENOUS

## 2023-02-03 MED ORDER — FENTANYL CITRATE (PF) 100 MCG/2ML IJ SOLN
INTRAMUSCULAR | Status: DC | PRN
  Administered 2023-02-03: 25 ug via INTRAVENOUS

## 2023-02-03 MED ORDER — IOHEXOL 350 MG/ML SOLN
INTRAVENOUS | Status: DC | PRN
  Administered 2023-02-03: 25 mL

## 2023-02-03 MED ORDER — ONDANSETRON HCL 4 MG/2ML IJ SOLN
4.0000 mg | Freq: Four times a day (QID) | INTRAMUSCULAR | Status: DC | PRN
  Administered 2023-02-03: 4 mg via INTRAVENOUS
  Filled 2023-02-03: qty 2

## 2023-02-03 MED ORDER — LIDOCAINE HCL (PF) 1 % IJ SOLN
INTRAMUSCULAR | Status: DC | PRN
  Administered 2023-02-03: 2 mL

## 2023-02-03 MED ORDER — HEPARIN (PORCINE) IN NACL 1000-0.9 UT/500ML-% IV SOLN
INTRAVENOUS | Status: DC | PRN
  Administered 2023-02-03 (×2): 500 mL

## 2023-02-03 MED ORDER — ACETAMINOPHEN 325 MG PO TABS: 650.0000 mg | ORAL_TABLET | ORAL | Status: DC | PRN

## 2023-02-03 MED ORDER — MIDAZOLAM HCL 2 MG/2ML IJ SOLN
INTRAMUSCULAR | Status: DC | PRN
  Administered 2023-02-03: 1 mg via INTRAVENOUS

## 2023-02-03 MED ORDER — SODIUM CHLORIDE 0.9% FLUSH: 3.0000 mL | INTRAVENOUS | Status: DC | PRN

## 2023-02-03 MED ORDER — SODIUM CHLORIDE 0.9% FLUSH
3.0000 mL | Freq: Two times a day (BID) | INTRAVENOUS | Status: DC
Start: 2023-02-03 — End: 2023-02-03

## 2023-02-03 MED ORDER — VERAPAMIL HCL 2.5 MG/ML IV SOLN
INTRAVENOUS | Status: AC
  Filled 2023-02-03: qty 2

## 2023-02-03 MED ORDER — FENTANYL CITRATE (PF) 100 MCG/2ML IJ SOLN
INTRAMUSCULAR | Status: AC
  Filled 2023-02-03: qty 2

## 2023-02-03 MED ORDER — SODIUM CHLORIDE 0.9 % IV SOLN: 250.0000 mL | INTRAVENOUS | Status: DC | PRN

## 2023-02-03 SURGICAL SUPPLY — 8 items
CATH 5FR JL3.5 JR4 ANG PIG MP (CATHETERS) IMPLANT
DEVICE RAD COMP TR BAND LRG (VASCULAR PRODUCTS) IMPLANT
GLIDESHEATH SLEND SS 6F .021 (SHEATH) IMPLANT
GUIDEWIRE INQWIRE 1.5J.035X260 (WIRE) IMPLANT
INQWIRE 1.5J .035X260CM (WIRE) ×1
PACK CARDIAC CATHETERIZATION (CUSTOM PROCEDURE TRAY) ×2 IMPLANT
SET ATX-X65L (MISCELLANEOUS) IMPLANT
SHEATH PROBE COVER 6X72 (BAG) IMPLANT

## 2023-02-03 NOTE — Progress Notes (Signed)
Patient and husband was given discharge instructions. Both verbalized understanding. 

## 2023-02-03 NOTE — Interval H&P Note (Signed)
History and Physical Interval Note:  02/03/2023 1:15 PM  Bethany [name]  has presented today for surgery, with the diagnosis of Unstable Angina.  The various methods of treatment have been discussed with the patient and family. After consideration of risks, benefits and other options for treatment, the patient has consented to  Procedure(s): LEFT HEART CATH AND CORONARY ANGIOGRAPHY (N/A) as a surgical intervention.  The patient's history has been reviewed, patient examined, no change in status, stable for surgery.  I have reviewed the patient's chart and labs.  Questions were answered to the patient's satisfaction.    Cath Lab Visit (complete for each Cath Lab visit)  Clinical Evaluation Leading to the Procedure:   ACS: Yes.    Non-ACS:    Anginal Classification: CCS III  Anti-ischemic medical therapy: Minimal Therapy (1 class of medications)  Non-Invasive Test Results: No non-invasive testing performed  Prior CABG: No previous CABG       Theron Arista [facility] 02/03/2023 1:15 PM

## 2023-02-03 NOTE — Progress Notes (Signed)
Patient was dizzy and nauseous. Patient was given Zofran and doctor called. Patient bedrest increased 1 hour per doctor Swaziland.

## 2023-02-03 NOTE — Discharge Instructions (Signed)

## 2023-02-04 ENCOUNTER — Encounter (HOSPITAL_COMMUNITY): Payer: Self-pay | Admitting: Cardiology

## 2023-02-05 ENCOUNTER — Inpatient Hospital Stay: Payer: 59 | Admitting: Hematology and Oncology

## 2023-02-05 ENCOUNTER — Other Ambulatory Visit (HOSPITAL_COMMUNITY): Payer: Self-pay

## 2023-02-17 ENCOUNTER — Ambulatory Visit: Payer: 59 | Admitting: Orthopaedic Surgery

## 2023-02-22 ENCOUNTER — Inpatient Hospital Stay: Payer: 59 | Attending: Hematology and Oncology | Admitting: Hematology and Oncology

## 2023-02-23 ENCOUNTER — Encounter: Payer: Self-pay | Admitting: *Deleted

## 2023-02-23 ENCOUNTER — Telehealth: Payer: Self-pay | Admitting: Hematology and Oncology

## 2023-02-23 NOTE — Telephone Encounter (Signed)
 Left patient a vm regarding upcoming appointment

## 2023-02-24 NOTE — Telephone Encounter (Signed)
Called and left message for patient advising Dr. Clifton James has recommended a medication change and that I will send the information here in MyChart.  Adv to review with APP at appointment Friday, or call if any questions.

## 2023-02-26 ENCOUNTER — Ambulatory Visit: Payer: 59 | Admitting: Physician Assistant

## 2023-02-26 MED ORDER — METOPROLOL SUCCINATE ER 50 MG PO TB24: 50.0000 mg | ORAL_TABLET | Freq: Every day | ORAL | 3 refills | Status: AC

## 2023-02-26 NOTE — Addendum Note (Signed)
Addended by: Lendon Ka on: 02/26/2023 12:52 PM   Modules accepted: Orders

## 2023-03-03 ENCOUNTER — Ambulatory Visit: Payer: 59 | Admitting: Orthopaedic Surgery

## 2023-03-08 ENCOUNTER — Encounter: Payer: Self-pay | Admitting: Internal Medicine

## 2023-03-24 ENCOUNTER — Inpatient Hospital Stay: Payer: 59 | Admitting: Hematology and Oncology

## 2023-03-25 ENCOUNTER — Ambulatory Visit: Payer: 59 | Admitting: Orthopaedic Surgery

## 2023-04-02 ENCOUNTER — Other Ambulatory Visit: Payer: Self-pay | Admitting: Physician Assistant

## 2023-04-02 ENCOUNTER — Telehealth: Payer: Self-pay | Admitting: Hematology and Oncology

## 2023-04-02 NOTE — Telephone Encounter (Signed)
Patient cancelled 1/8 appointment.  Left messages on 1/10, 1/14, and 1/17 to reschedule.

## 2023-04-15 ENCOUNTER — Ambulatory Visit: Payer: 59 | Admitting: Physician Assistant

## 2023-05-12 ENCOUNTER — Ambulatory Visit: Payer: 59 | Admitting: Cardiology

## 2023-07-23 ENCOUNTER — Encounter (HOSPITAL_COMMUNITY): Payer: Self-pay

## 2023-07-30 ENCOUNTER — Encounter

## 2023-08-26 ENCOUNTER — Encounter

## 2024-01-17 ENCOUNTER — Encounter: Payer: Self-pay | Admitting: Radiology

## 2024-03-14 ENCOUNTER — Encounter: Payer: Self-pay | Admitting: Hematology and Oncology

## 2024-03-14 DIAGNOSIS — Z1231 Encounter for screening mammogram for malignant neoplasm of breast: Secondary | ICD-10-CM

## 2024-03-28 ENCOUNTER — Encounter: Payer: Self-pay | Admitting: Hematology and Oncology

## 2024-03-28 ENCOUNTER — Other Ambulatory Visit: Payer: Self-pay | Admitting: Hematology and Oncology

## 2024-03-28 DIAGNOSIS — Z853 Personal history of malignant neoplasm of breast: Secondary | ICD-10-CM

## 2024-03-29 ENCOUNTER — Other Ambulatory Visit: Payer: Self-pay

## 2024-03-29 MED ORDER — VENLAFAXINE HCL ER 150 MG PO CP24: ORAL_CAPSULE | ORAL | 0 refills | Status: AC

## 2024-03-31 ENCOUNTER — Encounter

## 2024-03-31 ENCOUNTER — Inpatient Hospital Stay: Payer: Self-pay | Attending: Hematology and Oncology | Admitting: Hematology and Oncology

## 2024-03-31 ENCOUNTER — Ambulatory Visit
Admission: RE | Admit: 2024-03-31 | Discharge: 2024-03-31 | Disposition: A | Discharge status: Home / Self Care | Payer: Self-pay | Source: Ambulatory Visit | Attending: Hematology and Oncology | Admitting: Hematology and Oncology

## 2024-03-31 ENCOUNTER — Ambulatory Visit
Admission: RE | Admit: 2024-03-31 | Discharge: 2024-03-31 | Disposition: A | Discharge status: Home / Self Care | Source: Ambulatory Visit | Attending: Hematology and Oncology | Admitting: Hematology and Oncology

## 2024-03-31 ENCOUNTER — Encounter: Payer: Self-pay | Admitting: Hematology and Oncology

## 2024-03-31 ENCOUNTER — Other Ambulatory Visit: Payer: Self-pay | Admitting: Hematology and Oncology

## 2024-03-31 VITALS — BP 137/88 | HR 64 | Temp 98.0°F | Resp 18 | Wt 194.4 lb

## 2024-03-31 DIAGNOSIS — Z170 Estrogen receptor positive status [ER+]: Secondary | ICD-10-CM

## 2024-03-31 DIAGNOSIS — Z17411 Hormone receptor positive with human epidermal growth factor receptor 2 negative status: Secondary | ICD-10-CM | POA: Insufficient documentation

## 2024-03-31 DIAGNOSIS — Z853 Personal history of malignant neoplasm of breast: Secondary | ICD-10-CM

## 2024-03-31 DIAGNOSIS — L905 Scar conditions and fibrosis of skin: Secondary | ICD-10-CM | POA: Diagnosis not present

## 2024-03-31 DIAGNOSIS — C773 Secondary and unspecified malignant neoplasm of axilla and upper limb lymph nodes: Secondary | ICD-10-CM | POA: Insufficient documentation

## 2024-03-31 DIAGNOSIS — C50412 Malignant neoplasm of upper-outer quadrant of left female breast: Secondary | ICD-10-CM | POA: Insufficient documentation

## 2024-03-31 DIAGNOSIS — Z9049 Acquired absence of other specified parts of digestive tract: Secondary | ICD-10-CM | POA: Insufficient documentation

## 2024-03-31 DIAGNOSIS — N63 Unspecified lump in unspecified breast: Secondary | ICD-10-CM

## 2024-03-31 DIAGNOSIS — Z79899 Other long term (current) drug therapy: Secondary | ICD-10-CM | POA: Diagnosis not present

## 2024-03-31 NOTE — Progress Notes (Signed)
 " [facility]  [phone] [phone]     ID: Bethany [name] DOB: 1961/02/15  MR#: [id number]  RDW#:[id number]  Patient Care Team: Elliot Charm, MD as PCP - General (Internal Medicine) Verlin Lonni BIRCH, MD as PCP - Cardiology (Cardiology) Vanderbilt Ned, MD as Consulting Physician (General Surgery) Dewey Rush, MD as Consulting Physician (Radiation Oncology) Abran Rush SAILOR, MD as Consulting Physician (Gastroenterology) Faythe Purchase, MD as Consulting Physician (Endocrinology) Tyree Nanetta SAILOR, RN as Oncology Nurse Navigator Icard, Adine CROME, DO as Consulting Physician (Pulmonary Disease) Vernetta Lonni GRADE, MD as Consulting Physician (Orthopedic Surgery) Arelia Filippo, MD as Consulting Physician (Plastic Surgery)  CHIEF COMPLAINT: Estrogen receptor positive breast cancer  CURRENT TREATMENT: Observation  INTERVAL HISTORY:  Discussed the use of AI scribe software for clinical note transcription with the patient, who gave verbal consent to proceed.  History of Present Illness Bethany [name] Bethany [name] is a 64 year old female with estrogen receptor positive left breast cancer status post lumpectomy and radiation who presents for evaluation of persistent breast scar tissue.  She is seven years status post left lumpectomy and radiation for estrogen receptor positive breast cancer. Adjuvant endocrine therapy with tamoxifen  and anastrozole  was discontinued due to intolerable side effects, and she has since been monitored with imaging. She underwent right lumpectomy in 2021 for a benign lesion. Surveillance has included alternating mammograms and breast MRIs every six months, with her most recent mammogram and ultrasound today showing no evidence of malignancy but confirming persistent scar tissue.  She describes two indentations on each breast attributed to prior surgeries and scar tissue formation, which remain bothersome despite  prior excision. She expresses interest in fat grafting to address the indentations and requests referral for evaluation of fat transfer and cosmetic correction. She has previously consulted with Dr. Delmon, a plastic surgeon, for unrelated issues.  She continues venlafaxine  for major depressive disorder, which began after her father's death from cancer and her own diagnosis. She finds venlafaxine  effective, though she notes mild elevation in blood pressure, which is controlled with antihypertensive therapy. She has lost some weight recently and has received cosmetic fillers for facial rejuvenation.     COVID 19 VACCINATION STATUS: Status post Pfizer x2 as of November 2022  HISTORY OF CURRENT ILLNESS: The original intake note:  Bethany [name] had routine screening mammography on 10/26/2017 showing a possible abnormality in the left breast. She underwent unilateral left diagnostic mammography with tomography and left breast ultrasonography at [facility] on 10/29/2017 showing: breast density category B. There was a hypoechoic lesion consistent of a mass at the 2:30 o'clock upper outer quadrant middle depth and measuring 0.5 x 0.3 x 0.4 cm. Sonographic evaluation of the left axilla shows no enlarged or abnormal lymph nodes.  An attempt was made to obtain a biopsy of this lesion on 10/29/2017, however the patient had repeated episodes of syncope during the attempted procedure.  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 Margins were negative for carcinoma. Prognostic indicators significant for: estrogen receptor, 90% positive and progesterone receptor, 80% positive, both with strong staining intensity. Proliferation marker Ki67 at 3%. HER2 negative with an immunohistochemistry of (1+).  Then in a separate procedure she underwent left sentinel lymph node sampling on 12/09/2017 showing ([id number]): Four left axillary  sentinel lymph were negative for carcinoma. (0/4).   Her subsequent history is as detailed below.   PAST MEDICAL HISTORY: Past Medical History:  Diagnosis Date   ADD (attention deficit disorder)    Anxiety    B12 deficiency    Breast cancer (HCC) 2019   left breast cancer/diagnosed in 10/2017/taking radiation treatment until 03/15/18   Breast mass, left 11/2017   going thru radiation until 02/2018   CAD (coronary artery disease)    a. DES to LAD 06/2019.   Chest pain 06/19/2014   COPD (chronic obstructive pulmonary disease) (HCC)    beginning stages/small scar   Dental crowns present    Depression    Hiatal hernia    History of hiatal hernia    no current med.   History of thyroid  cancer 12/15/2017   Hypertension    states under control with med., has been on med. x 1 yr.   Hypothyroidism    Malignant neoplasm of upper-outer quadrant of left breast in female, estrogen receptor positive (HCC)    Mild hyperlipidemia    Mucocele of appendix 10/03/2015   MVP (mitral valve prolapse)    not seen on echo 05/2019   Personal history of radiation therapy 2019   Left Breast Cancer   PONV (postoperative nausea and vomiting)    Pre-diabetes    Prediabetes    Radiation fibrosis of lung    Syncope and collapse 12/25/2008   Qualifier: Diagnosis of  By: Norita Iha     Urinary incontinence    USI    Vasovagal syncope    Vitamin D  deficiency   Thyroid  cancer, GERD   PAST SURGICAL HISTORY: Past Surgical History:  Procedure Laterality Date   ABDOMINAL HYSTERECTOMY     partial   APPENDECTOMY     AXILLARY SENTINEL NODE BIOPSY Left 12/09/2017   Procedure: AXILLARY SENTINEL NODE BIOPSY;  Surgeon: Vanderbilt Ned, MD;  Location: [facility];  Service: General;  Laterality: Left;   BREAST BIOPSY Right 2022   BREAST EXCISIONAL BIOPSY Right 09/20/2019   BREAST EXCISIONAL BIOPSY Left 2023   BREAST EXCISIONAL BIOPSY Right 2021   BREAST LUMPECTOMY Left 11/2017    BREAST LUMPECTOMY Left 02/25/2022   Procedure: LEFT BREAST LUMPECTOMY;  Surgeon: Vanderbilt Ned, MD;  Location: [facility] OR;  Service: General;  Laterality: Left;   BREAST LUMPECTOMY WITH NEEDLE LOCALIZATION Left 11/25/2017   Procedure: LEFT BREAST LUMPECTOMY WITH NEEDLE LOCALIZATION;  Surgeon: Vanderbilt Ned, MD;  Location: [facility];  Service: General;  Laterality: Left;   BREAST LUMPECTOMY WITH RADIOACTIVE SEED LOCALIZATION Right 09/20/2019   Procedure: RIGHT BREAST LUMPECTOMY WITH RADIOACTIVE SEED LOCALIZATION;  Surgeon: Vanderbilt Ned, MD;  Location: [facility];  Service: General;  Laterality: Right;   CARDIAC CATHETERIZATION     COLONOSCOPY WITH PROPOFOL   10/03/2015   CORONARY STENT INTERVENTION N/A 06/29/2019   Procedure: CORONARY STENT INTERVENTION;  Surgeon: Verlin Lonni BIRCH, MD;  Location: [facility];  Service: Cardiovascular;  Laterality: N/A;   LAPAROSCOPIC APPENDECTOMY N/A 10/03/2015   Procedure: APPENDECTOMY LAPAROSCOPIC;  Surgeon: Donnice Bury, MD;  Location: [facility] OR;  Service: General;  Laterality: N/A;   LEFT HEART CATH AND CORONARY ANGIOGRAPHY N/A 06/29/2019   Procedure: LEFT HEART CATH AND CORONARY ANGIOGRAPHY;  Surgeon: Verlin Lonni BIRCH, MD;  Location: [facility];  Service: Cardiovascular;  Laterality: N/A;   LEFT HEART CATH AND CORONARY ANGIOGRAPHY N/A 02/03/2023   Procedure: LEFT HEART CATH AND CORONARY ANGIOGRAPHY;  Surgeon: Jordan, Peter M, MD;  Location: [facility] INVASIVE CV LAB;  Service: Cardiovascular;  Laterality: N/A;   TOTAL THYROIDECTOMY  04/02/2003  Hysterectomy without  BSO   FAMILY HISTORY Family History  Problem Relation Age of Onset   Colon polyps Mother    High Cholesterol Mother    Alcoholism Mother    Hypertension Father    Heart disease Father    Non-Hodgkin's lymphoma Father 3   Cancer Father    Depression Father    Anxiety disorder Father    Alcoholism Father    Hypertension Brother     Lung cancer Paternal Aunt        d. 52   Throat cancer Maternal Grandfather        d. 74   Throat cancer Paternal Grandmother        dx < 50   Heart disease Paternal Grandfather    Colon cancer Other        great maternal aunt    Stomach cancer Neg Hx    Rectal cancer Neg Hx    Esophageal cancer Neg Hx   The patient's father died in 01-20-19age age 11 due to non-Hodgkin's lymphoma. The patient's mother is alive at age 22 as of October 2019. The patient has 1 brother, no sisters. There was a paternal aunt with lung cancer. There was a paternal grandmother and maternal grandfather with throat cancer. She denies a family history of breast or ovarian cancer.    GYNECOLOGIC HISTORY:  No LMP recorded. Patient has had a hysterectomy. Menarche: 64 years old Age at first live birth: 64 years old She is GX P3.  She is status post partial hysterectomy (without BSO) in her late 71's. She did not have HRT.    SOCIAL HISTORY: (updated 09/2018) Eve owns a jewelry business. Her husband, Lemond, is a copywriter, advertising for [employer]. At home is her, her husband, and their two dogs. The patient's oldest son, [place], has 3 children and lives in [place] and works in psychologist, clinical for [employer]. The patient's son, Hipolito lives in [place] with 1 daughter and works as a programmer, systems. The patient's youngest son, Margrette lives in [place] and works for [employer] in [place]. The patient has 5 grandchildren total. Her grandchildren are ages 11, 62, 19, 3, and 3. She notes the youngest has Down Syndrome and recently turned 3. She does not currently belong to a church, but she is [employer].     ADVANCED DIRECTIVES: In the absence of any documentation to the contrary, the patient's spouse is their HCPOA.    HEALTH MAINTENANCE: Social History   Tobacco Use   Smoking status: Former    Current packs/day: 0.00    Types: Cigarettes    Quit date: 08/14/2014    Years since quitting: 9.6   Smokeless tobacco: Never   Vaping Use   Vaping status: Never Used  Substance Use Topics   Alcohol use: Not Currently   Drug use: No    Colonoscopy: 03/15/2018- to be repeated in 3 years under Dr. Abran  PAP:   Bone density: 10/25/2017 showed osteopenia   Allergies  Allergen Reactions   Oxycodone  Nausea And Vomiting   Latex Rash   Other Rash    Sutures caused severe rash.   Sulfa Antibiotics Hives    Current Outpatient Medications  Medication Sig Dispense Refill   albuterol  (VENTOLIN  HFA) 108 (90 Base) MCG/ACT inhaler Inhale 2 puffs into the lungs every 6 (six) hours as needed for wheezing or shortness of breath. 18 g 6   aspirin  EC 81 MG tablet Take 81 mg by mouth daily. Swallow whole.  cetirizine  (ZYRTEC ) 10 MG tablet Take 10 mg by mouth daily.     Cyanocobalamin (B-12 COMPLIANCE INJECTION IJ) Inject 1 Dose as directed every 30 (thirty) days.     Evolocumab  (REPATHA  SURECLICK) 140 MG/ML SOAJ Inject 140 mg into the skin every 14 (fourteen) days. 2 mL 11   ibuprofen  (ADVIL ) 200 MG tablet Take 800 mg by mouth every 6 (six) hours as needed for moderate pain (pain score 4-6).     levothyroxine  (SYNTHROID ) 112 MCG tablet Take 112 mcg by mouth daily.     metoprolol  succinate (TOPROL -XL) 50 MG 24 hr tablet Take 1 tablet (50 mg total) by mouth daily. Take with or immediately following a meal. 90 tablet 3   Multiple Vitamin (MULTIVITAMIN WITH MINERALS) TABS tablet Take 1 tablet by mouth daily.     nitroGLYCERIN  (NITROSTAT ) 0.4 MG SL tablet Place 1 tablet (0.4 mg total) under the tongue every 5 (five) minutes as needed for chest pain. 25 tablet 3   venlafaxine  XR (EFFEXOR -XR) 150 MG 24 hr capsule TAKE 1 CAPSULE BY MOUTH EVERY DAY WITH BREAKFAST 90 capsule 0   Vitamin D , Ergocalciferol , (DRISDOL ) 1.25 MG (50000 UNIT) CAPS capsule Take 1 capsule (50,000 Units total) by mouth every 7 (seven) days. 4 capsule 0   No current facility-administered medications for this visit.    OBJECTIVE: White woman in no acute  distress  Vitals:   03/31/24 1336  BP: 137/88  Pulse: 64  Resp: 18  Temp: 98 F (36.7 C)  SpO2: 100%      Body mass index is 30.91 kg/m.   Wt Readings from Last 3 Encounters:  03/31/24 194 lb 6.4 oz (88.2 kg)  02/03/23 198 lb (89.8 kg)  01/25/23 208 lb 12.8 oz (94.7 kg)     ECOG FS:1 - Symptomatic but completely ambulatory  Physical Exam Constitutional:      Appearance: Normal appearance.  HENT:     Head: Normocephalic and atraumatic.  Cardiovascular:     Rate and Rhythm: Normal rate and regular rhythm.     Pulses: Normal pulses.     Heart sounds: Normal heart sounds.  Chest:       Comments: Bilateral breasts inspected and palpated. No palpable masses No regional adenopathy Musculoskeletal:     Cervical back: Normal range of motion and neck supple. No rigidity.  Lymphadenopathy:     Cervical: No cervical adenopathy.  Skin:    General: Skin is warm and dry.  Neurological:     Mental Status: She is alert.     LAB RESULTS:  CMP     Component Value Date/Time   NA 138 01/18/2023 1936   NA 144 12/02/2021 1525   K 4.1 01/18/2023 1936   CL 105 01/18/2023 1936   CO2 23 01/18/2023 1936   GLUCOSE 97 01/18/2023 1936   BUN 8 01/18/2023 1936   BUN 15 12/02/2021 1525   CREATININE 0.78 01/18/2023 1936   CREATININE 0.86 06/26/2021 1514   CALCIUM  9.1 01/18/2023 1936   PROT 7.0 06/26/2021 1514   PROT 6.8 05/22/2021 1041   ALBUMIN 4.0 06/26/2021 1514   ALBUMIN 4.6 05/22/2021 1041   AST 26 06/26/2021 1514   ALT 30 06/26/2021 1514   ALKPHOS 72 06/26/2021 1514   BILITOT 0.4 06/26/2021 1514   GFRNONAA >60 01/18/2023 1936   GFRNONAA >60 06/26/2021 1514   GFRAA >60 10/12/2019 0812   GFRAA >60 12/16/2017 1540    No results found for: TOTALPROTELP, ALBUMINELP, A1GS, A2GS, BETS, BETA2SER, GAMS,  MSPIKE, SPEI  No results found for: JONATHAN BONG, [facility]  Lab Results  Component Value Date   WBC 6.7 01/18/2023   NEUTROABS 3.0  06/26/2021   HGB 13.3 01/18/2023   HCT 41.8 01/18/2023   MCV 92.5 01/18/2023   PLT 313 01/18/2023   No results found for: LABCA2  No components found for: [id number]  No results for input(s): INR in the last 168 hours.  No results found for: LABCA2  No results found for: RJW800  No results found for: CAN125  No results found for: CAN153  No results found for: CA2729  No components found for: HGQUANT  No results found for: CEA1, CEA / No results found for: CEA1, CEA   No results found for: AFPTUMOR  No results found for: CHROMOGRNA  No results found for: HGBA, HGBA2QUANT, HGBFQUANT, HGBSQUAN (Hemoglobinopathy evaluation)   No results found for: LDH  No results found for: IRON, TIBC, IRONPCTSAT (Iron and TIBC)  No results found for: FERRITIN  Urinalysis    Component Value Date/Time   COLORURINE YELLOW 10/02/2015 2022   APPEARANCEUR CLOUDY (A) 10/02/2015 2022   LABSPEC 1.020 10/02/2015 2022   PHURINE 6.5 10/02/2015 2022   GLUCOSEU NEGATIVE 10/02/2015 2022   HGBUR NEGATIVE 10/02/2015 2022   BILIRUBINUR NEGATIVE 10/02/2015 2022   KETONESUR 15 (A) 10/02/2015 2022   PROTEINUR NEGATIVE 10/02/2015 2022   UROBILINOGEN 0.2 09/20/2014 1536   NITRITE NEGATIVE 10/02/2015 2022   LEUKOCYTESUR SMALL (A) 10/02/2015 2022    STUDIES: No results found.   ELIGIBLE FOR AVAILABLE RESEARCH PROTOCOL: No   ASSESSMENT: 64 y.o. [place], [place] woman status post left breast upper outer quadrant lumpectomy 11/25/2017 for a pT1b pNX, stage Ia invasive ductal carcinoma, grade 1, estrogen and progesterone receptor positive, HER-2/neu negative, with an MIB-1 of 3%  (1) status post left axillary lymph node sampling 12/09/2017, all 4 sentinel lymph nodes clear  (2) Oncotype score of 24 predicts a 10 % risk of recurrence outside the breast over the next 9 years if the patient's only systemic therapy is an estrogens for 5 years.  It also  predicts no benefit from chemotherapy  (3) adjuvant radiation 01/27/2018 - 03/15/2018  (a) left breast / 50.4 Gy in 28 fractions  (b) boost / 10 Gy in 5 fractions  (4) started tamoxifen  04/16/2018  (a) changed to 10 mg daily May 2020 per patient  (b) discontinued per patient December 2020 secondary to side effects  (5) anastrozole  started June 12, 2019, discontinued by the patient after a few weeks because of depression, cramps, and concerns regarding bone density  (6) additional surgery:  (a) right lumpectomy 09/20/2019 showed only a ductal papilloma, no evidence of malignancy  (b) left breast biopsy 01/16/2020 shows no malignancy.  (7) exemestane  started 10/12/2019, discontinued 11/26/2019 with intolerable side effects  (a) bone density November 2021  (8) intensified screening:  (a) breast MRI yearly in March   (b) mammography yearly and September 2022   PLAN:  Assessment and Plan Assessment & Plan Estrogen receptor positive left breast cancer, status post lumpectomy and radiation Seven years post-treatment with no evidence of malignancy on recent imaging. Unable to tolerate tamoxifen  and anastrozole . Annual mammography is appropriate;   - Ordered annual screening mammogram for next year. - Provided reassurance regarding negative imaging findings. - Advised maintenance of annual mammography surveillance.  Breast scar tissue post-lumpectomy Interested in fat grafting for cosmetic correction; outcomes variable. - Referred to plastic surgery (Dr. Corrie) for evaluation of fat grafting. - Encouraged  her to contact plastic surgery to schedule consultation.  Major depressive disorder Well-managed on venlafaxine .  - Advised continuation of current dose as long as blood pressure remains controlled. - Discussed risk of withdrawal symptoms with abrupt discontinuation. - Advised that either oncology or primary care can refill venlafaxine  as needed, provided she maintains regular  follow-up.    Total time encounter: 30 minutes.  *Total Encounter Time as defined by the Centers for Medicare and Medicaid Services includes, in addition to the face-to-face time of a patient visit (documented in the note above) non-face-to-face time: obtaining and reviewing outside history, ordering and reviewing medications, tests or procedures, care coordination (communications with other health care professionals or caregivers) and documentation in the medical record. "

## 2024-04-12 ENCOUNTER — Encounter

## 2024-04-14 ENCOUNTER — Other Ambulatory Visit: Payer: Self-pay

## 2024-04-14 ENCOUNTER — Encounter: Payer: Self-pay | Admitting: Neurology

## 2024-04-14 DIAGNOSIS — R202 Paresthesia of skin: Secondary | ICD-10-CM

## 2024-04-24 ENCOUNTER — Institutional Professional Consult (permissible substitution): Admitting: Plastic Surgery

## 2024-06-08 ENCOUNTER — Encounter: Payer: Self-pay | Admitting: Neurology

## 2025-04-06 ENCOUNTER — Inpatient Hospital Stay: Admitting: Hematology and Oncology
